# Patient Record
Sex: Male | Born: 1950
Health system: Southern US, Community
[De-identification: ages and names within clinical notes are randomized; demographics above are authoritative.]

## PROBLEM LIST (undated history)

## (undated) DIAGNOSIS — H409 Unspecified glaucoma: Secondary | ICD-10-CM

## (undated) DIAGNOSIS — E785 Hyperlipidemia, unspecified: Secondary | ICD-10-CM

## (undated) DIAGNOSIS — N400 Enlarged prostate without lower urinary tract symptoms: Secondary | ICD-10-CM

## (undated) DIAGNOSIS — G249 Dystonia, unspecified: Secondary | ICD-10-CM

## (undated) DIAGNOSIS — E079 Disorder of thyroid, unspecified: Secondary | ICD-10-CM

## (undated) DIAGNOSIS — E119 Type 2 diabetes mellitus without complications: Secondary | ICD-10-CM

## (undated) DIAGNOSIS — K219 Gastro-esophageal reflux disease without esophagitis: Secondary | ICD-10-CM

## (undated) DIAGNOSIS — I10 Essential (primary) hypertension: Secondary | ICD-10-CM

## (undated) DIAGNOSIS — N529 Male erectile dysfunction, unspecified: Secondary | ICD-10-CM

## (undated) DIAGNOSIS — R319 Hematuria, unspecified: Secondary | ICD-10-CM

## (undated) DIAGNOSIS — N189 Chronic kidney disease, unspecified: Secondary | ICD-10-CM

## (undated) DIAGNOSIS — I639 Cerebral infarction, unspecified: Secondary | ICD-10-CM

## (undated) DIAGNOSIS — M1990 Unspecified osteoarthritis, unspecified site: Secondary | ICD-10-CM

## (undated) DIAGNOSIS — K649 Unspecified hemorrhoids: Secondary | ICD-10-CM

## (undated) HISTORY — DX: Disorder of thyroid, unspecified: E07.9

## (undated) HISTORY — DX: Essential (primary) hypertension: I10

## (undated) HISTORY — DX: Hyperlipidemia, unspecified: E78.5

## (undated) HISTORY — PX: EYE SURGERY: SHX253

## (undated) HISTORY — DX: Cerebral infarction, unspecified: I63.9

## (undated) HISTORY — DX: Unspecified hemorrhoids: K64.9

## (undated) HISTORY — DX: Unspecified osteoarthritis, unspecified site: M19.90

## (undated) HISTORY — DX: Type 2 diabetes mellitus without complications: E11.9

## (undated) HISTORY — PX: CATARACT EXTRACTION: SUR2

## (undated) HISTORY — DX: Gastro-esophageal reflux disease without esophagitis: K21.9

## (undated) HISTORY — PX: EXTRACORPOREAL SHOCK WAVE LITHOTRIPSY: SHX1557

---

## 2006-03-20 ENCOUNTER — Ambulatory Visit: Payer: Self-pay | Admitting: Unknown Physician Specialty

## 2006-09-15 ENCOUNTER — Emergency Department: Payer: Self-pay | Admitting: Emergency Medicine

## 2006-09-28 ENCOUNTER — Ambulatory Visit: Payer: Self-pay | Admitting: Urology

## 2006-09-29 ENCOUNTER — Ambulatory Visit: Payer: Self-pay | Admitting: Urology

## 2006-12-01 ENCOUNTER — Ambulatory Visit: Payer: Self-pay | Admitting: Urology

## 2007-01-01 ENCOUNTER — Ambulatory Visit: Payer: Self-pay | Admitting: Urology

## 2007-01-01 ENCOUNTER — Other Ambulatory Visit: Payer: Self-pay

## 2007-01-13 ENCOUNTER — Ambulatory Visit: Payer: Self-pay | Admitting: Urology

## 2007-03-04 HISTORY — PX: KIDNEY STONE SURGERY: SHX686

## 2007-04-11 ENCOUNTER — Ambulatory Visit: Payer: Self-pay | Admitting: Unknown Physician Specialty

## 2007-08-11 ENCOUNTER — Encounter: Payer: Self-pay | Admitting: Psychiatry

## 2007-09-01 ENCOUNTER — Encounter: Payer: Self-pay | Admitting: Psychiatry

## 2007-11-30 ENCOUNTER — Ambulatory Visit: Payer: Self-pay | Admitting: Urology

## 2007-12-01 ENCOUNTER — Ambulatory Visit: Payer: Self-pay | Admitting: Urology

## 2008-07-25 ENCOUNTER — Ambulatory Visit: Payer: Self-pay | Admitting: Urology

## 2009-03-03 HISTORY — PX: PROSTATE SURGERY: SHX751

## 2009-09-15 ENCOUNTER — Emergency Department: Payer: Self-pay | Admitting: Emergency Medicine

## 2009-12-29 ENCOUNTER — Emergency Department: Payer: Self-pay | Admitting: Emergency Medicine

## 2010-02-09 ENCOUNTER — Emergency Department: Payer: Self-pay | Admitting: Emergency Medicine

## 2010-02-21 ENCOUNTER — Emergency Department: Payer: Self-pay | Admitting: Emergency Medicine

## 2010-03-13 ENCOUNTER — Ambulatory Visit: Payer: Self-pay | Admitting: Urology

## 2010-03-18 ENCOUNTER — Ambulatory Visit: Payer: Self-pay | Admitting: Urology

## 2010-07-09 ENCOUNTER — Ambulatory Visit: Payer: Self-pay | Admitting: Internal Medicine

## 2012-07-22 DIAGNOSIS — G248 Other dystonia: Secondary | ICD-10-CM | POA: Insufficient documentation

## 2012-12-07 ENCOUNTER — Emergency Department: Payer: Self-pay | Admitting: Emergency Medicine

## 2012-12-07 LAB — CBC
MCH: 31 pg (ref 26.0–34.0)
MCHC: 34.6 g/dL (ref 32.0–36.0)
MCV: 90 fL (ref 80–100)
Platelet: 194 10*3/uL (ref 150–440)
RBC: 4.9 10*6/uL (ref 4.40–5.90)

## 2012-12-07 LAB — DRUG SCREEN, URINE
Amphetamines, Ur Screen: NEGATIVE (ref ?–1000)
Benzodiazepine, Ur Scrn: NEGATIVE (ref ?–200)
Cannabinoid 50 Ng, Ur ~~LOC~~: NEGATIVE (ref ?–50)
Cocaine Metabolite,Ur ~~LOC~~: NEGATIVE (ref ?–300)
MDMA (Ecstasy)Ur Screen: NEGATIVE (ref ?–500)
Methadone, Ur Screen: NEGATIVE (ref ?–300)
Opiate, Ur Screen: NEGATIVE (ref ?–300)
Tricyclic, Ur Screen: NEGATIVE (ref ?–1000)

## 2012-12-07 LAB — COMPREHENSIVE METABOLIC PANEL
Albumin: 3.8 g/dL (ref 3.4–5.0)
Alkaline Phosphatase: 87 U/L (ref 50–136)
Bilirubin,Total: 0.5 mg/dL (ref 0.2–1.0)
Calcium, Total: 8.9 mg/dL (ref 8.5–10.1)
Chloride: 104 mmol/L (ref 98–107)
Co2: 27 mmol/L (ref 21–32)
EGFR (African American): >60
Potassium: 3.3 mmol/L — ABNORMAL LOW (ref 3.5–5.1)
SGPT (ALT): 49 U/L (ref 12–78)

## 2012-12-07 LAB — URINALYSIS, COMPLETE
Bacteria: NONE SEEN
Bilirubin,UR: NEGATIVE
Blood: NEGATIVE
Glucose,UR: NEGATIVE mg/dL (ref 0–75)
Ketone: NEGATIVE
Leukocyte Esterase: NEGATIVE
Nitrite: NEGATIVE
RBC,UR: 4 /HPF (ref 0–5)
Squamous Epithelial: NONE SEEN

## 2013-08-22 ENCOUNTER — Ambulatory Visit: Payer: Self-pay | Admitting: Family Medicine

## 2014-04-24 DIAGNOSIS — R42 Dizziness and giddiness: Secondary | ICD-10-CM | POA: Diagnosis not present

## 2014-05-04 DIAGNOSIS — R42 Dizziness and giddiness: Secondary | ICD-10-CM | POA: Diagnosis not present

## 2014-05-31 ENCOUNTER — Ambulatory Visit
Admit: 2014-05-31 | Disposition: A | Discharge status: Home / Self Care | Payer: Self-pay | Attending: Family Medicine | Admitting: Family Medicine

## 2014-05-31 DIAGNOSIS — M25572 Pain in left ankle and joints of left foot: Secondary | ICD-10-CM | POA: Diagnosis not present

## 2014-05-31 DIAGNOSIS — M25462 Effusion, left knee: Secondary | ICD-10-CM | POA: Diagnosis not present

## 2014-05-31 DIAGNOSIS — Z9181 History of falling: Secondary | ICD-10-CM | POA: Diagnosis not present

## 2014-05-31 DIAGNOSIS — M25562 Pain in left knee: Secondary | ICD-10-CM | POA: Diagnosis not present

## 2014-05-31 DIAGNOSIS — M19072 Primary osteoarthritis, left ankle and foot: Secondary | ICD-10-CM | POA: Diagnosis not present

## 2014-05-31 DIAGNOSIS — S8992XA Unspecified injury of left lower leg, initial encounter: Secondary | ICD-10-CM | POA: Diagnosis not present

## 2014-08-02 ENCOUNTER — Other Ambulatory Visit: Payer: Self-pay | Admitting: Family Medicine

## 2014-08-02 ENCOUNTER — Telehealth: Payer: Self-pay | Admitting: Family Medicine

## 2014-08-02 ENCOUNTER — Other Ambulatory Visit: Payer: Self-pay

## 2014-08-02 DIAGNOSIS — E11319 Type 2 diabetes mellitus with unspecified diabetic retinopathy without macular edema: Secondary | ICD-10-CM

## 2014-08-02 DIAGNOSIS — E1136 Type 2 diabetes mellitus with diabetic cataract: Secondary | ICD-10-CM | POA: Insufficient documentation

## 2014-08-02 MED ORDER — GLIPIZIDE ER 2.5 MG PO TB24: 2.5000 mg | ORAL_TABLET | Freq: Every day | ORAL | Status: DC

## 2014-08-02 NOTE — Telephone Encounter (Signed)
Called pharmacy and cancelled out the order for Glipizide was Rx last year in 08/2013 with 1 refill Tx np

## 2014-08-03 NOTE — Telephone Encounter (Signed)
Pt sent in refill request to [facility] for medication. It was sent via [facility] to the appropriate pharmacy. A 30 day supply of medication was sent due to patient need for diabetes follow-up.

## 2014-10-26 ENCOUNTER — Ambulatory Visit (INDEPENDENT_AMBULATORY_CARE_PROVIDER_SITE_OTHER): Payer: Commercial Managed Care - HMO | Admitting: Family Medicine

## 2014-10-26 ENCOUNTER — Encounter: Payer: Self-pay | Admitting: Family Medicine

## 2014-10-26 VITALS — BP 118/77 | HR 69 | Temp 98.4°F | Resp 16 | Ht 63.0 in | Wt 153.0 lb

## 2014-10-26 DIAGNOSIS — M25569 Pain in unspecified knee: Secondary | ICD-10-CM | POA: Insufficient documentation

## 2014-10-26 DIAGNOSIS — K219 Gastro-esophageal reflux disease without esophagitis: Secondary | ICD-10-CM | POA: Insufficient documentation

## 2014-10-26 DIAGNOSIS — I10 Essential (primary) hypertension: Secondary | ICD-10-CM | POA: Insufficient documentation

## 2014-10-26 DIAGNOSIS — N182 Chronic kidney disease, stage 2 (mild): Secondary | ICD-10-CM

## 2014-10-26 DIAGNOSIS — N529 Male erectile dysfunction, unspecified: Secondary | ICD-10-CM | POA: Insufficient documentation

## 2014-10-26 DIAGNOSIS — R42 Dizziness and giddiness: Secondary | ICD-10-CM | POA: Insufficient documentation

## 2014-10-26 DIAGNOSIS — H269 Unspecified cataract: Secondary | ICD-10-CM | POA: Insufficient documentation

## 2014-10-26 DIAGNOSIS — E1139 Type 2 diabetes mellitus with other diabetic ophthalmic complication: Secondary | ICD-10-CM | POA: Insufficient documentation

## 2014-10-26 DIAGNOSIS — D2322 Other benign neoplasm of skin of left ear and external auricular canal: Secondary | ICD-10-CM

## 2014-10-26 DIAGNOSIS — M159 Polyosteoarthritis, unspecified: Secondary | ICD-10-CM | POA: Insufficient documentation

## 2014-10-26 DIAGNOSIS — R319 Hematuria, unspecified: Secondary | ICD-10-CM | POA: Insufficient documentation

## 2014-10-26 DIAGNOSIS — I129 Hypertensive chronic kidney disease with stage 1 through stage 4 chronic kidney disease, or unspecified chronic kidney disease: Secondary | ICD-10-CM

## 2014-10-26 DIAGNOSIS — E1136 Type 2 diabetes mellitus with diabetic cataract: Secondary | ICD-10-CM | POA: Diagnosis not present

## 2014-10-26 DIAGNOSIS — M25579 Pain in unspecified ankle and joints of unspecified foot: Secondary | ICD-10-CM | POA: Insufficient documentation

## 2014-10-26 DIAGNOSIS — E785 Hyperlipidemia, unspecified: Secondary | ICD-10-CM | POA: Diagnosis not present

## 2014-10-26 DIAGNOSIS — E1169 Type 2 diabetes mellitus with other specified complication: Secondary | ICD-10-CM | POA: Insufficient documentation

## 2014-10-26 LAB — POCT GLYCOSYLATED HEMOGLOBIN (HGB A1C): HEMOGLOBIN A1C: 6.2

## 2014-10-26 MED ORDER — ASPIRIN 81 MG PO TABS: 81.0000 mg | ORAL_TABLET | Freq: Every day | ORAL | Status: DC

## 2014-10-26 MED ORDER — BENAZEPRIL-HYDROCHLOROTHIAZIDE 10-12.5 MG PO TABS: 1.0000 | ORAL_TABLET | Freq: Every day | ORAL | Status: DC

## 2014-10-26 MED ORDER — METOPROLOL TARTRATE 25 MG PO TABS: 12.5000 mg | ORAL_TABLET | Freq: Two times a day (BID) | ORAL | Status: DC

## 2014-10-26 MED ORDER — LOVASTATIN 20 MG PO TABS: 20.0000 mg | ORAL_TABLET | Freq: Every day | ORAL | Status: DC

## 2014-10-26 MED ORDER — GLIPIZIDE ER 2.5 MG PO TB24: 2.5000 mg | ORAL_TABLET | Freq: Every day | ORAL | Status: DC

## 2014-10-26 NOTE — Progress Notes (Signed)
Subjective:    Patient ID: Ross Riley, male    DOB: [birth year], 64 y.o.   MRN: [id number]  HPI: Ross Riley is a 64 y.o. male presenting on 10/26/2014 for Diabetes; Hyperlipidemia; and Hypertension   Diabetes He presents for his follow-up diabetic visit. He has type 2 diabetes mellitus. Pertinent negatives for hypoglycemia include no headaches. Pertinent negatives for diabetes include no blurred vision, no chest pain, no foot paresthesias, no foot ulcerations, no polydipsia, no polyphagia, no polyuria, no visual change and no weakness. There are no hypoglycemic complications. Symptoms are stable. Pertinent negatives for diabetic complications include no CVA, heart disease or retinopathy. Risk factors for coronary artery disease include dyslipidemia, diabetes mellitus and male sex. He participates in exercise daily. His overall blood glucose range is 110-130 mg/dl. An ACE inhibitor/angiotensin II receptor blocker is being taken. He does not see a podiatrist.Eye exam is current.  Hyperlipidemia This is a chronic problem. The problem is controlled. Exacerbating diseases include diabetes. Pertinent negatives include no chest pain or shortness of breath. Current antihyperlipidemic treatment includes statins. The current treatment provides moderate improvement of lipids. Risk factors for coronary artery disease include diabetes mellitus, dyslipidemia and hypertension.  Hypertension This is a chronic problem. Pertinent negatives include no blurred vision, chest pain, headaches or shortness of breath. There are no associated agents to hypertension. Risk factors for coronary artery disease include diabetes mellitus, dyslipidemia and male gender. Past treatments include ACE inhibitors and beta blockers. The current treatment provides significant improvement. There is no history of CVA or retinopathy.   BP- 120/80 avg at home. No visual changes or HA.   Eye Exam UTD:  05/02/2014- pt reports no  retinopathy was found.   Past Medical History  Diagnosis Date  . Hypertension   . Diabetes mellitus without complication   . Hyperlipidemia   . Arthritis   . GERD (gastroesophageal reflux disease)     No current outpatient prescriptions on file prior to visit.   No current facility-administered medications on file prior to visit.    Review of Systems  Constitutional: Negative for fever and chills.  HENT: Negative.   Eyes: Negative for blurred vision, photophobia and visual disturbance.  Respiratory: Negative for chest tightness, shortness of breath and wheezing.   Cardiovascular: Negative for chest pain.  Gastrointestinal: Negative for nausea, vomiting and abdominal pain.  Endocrine: Negative for cold intolerance, heat intolerance, polydipsia, polyphagia and polyuria.  Genitourinary: Negative.   Musculoskeletal: Negative.   Neurological: Negative for weakness, light-headedness, numbness and headaches.  Psychiatric/Behavioral: Negative.    Per HPI unless specifically indicated above     Objective:    BP 118/77 mmHg  Pulse 69  Temp(Src) 98.4 F (36.9 C) (Oral)  Resp 16  Ht 5\' 3"  (1.6 m)  Wt 153 lb (69.4 kg)  BMI 27.11 kg/m2  Wt Readings from Last 3 Encounters:  10/26/14 153 lb (69.4 kg)  05/31/14 159 lb (72.122 kg)    Physical Exam  Constitutional: He is oriented to person, place, and time. He appears well-developed and well-nourished. No distress.  Neck: Normal range of motion. Neck supple. No thyromegaly present.  Cardiovascular: Normal rate and regular rhythm.  Exam reveals no gallop and no friction rub.   No murmur heard. Pulmonary/Chest: Effort normal and breath sounds normal.  Abdominal: Soft. Bowel sounds are normal. There is no tenderness. There is no rebound.  Musculoskeletal: Normal range of motion. He exhibits no edema or tenderness.  Lymphadenopathy:    He has  no cervical adenopathy.  Neurological: He is alert and oriented to person, place, and time.   Skin: Skin is warm and dry. He is not diaphoretic.   Diabetic Foot Exam - Simple   Simple Foot Form  Diabetic Foot exam was performed with the following findings:  Yes 10/26/2014  1:50 PM  Visual Inspection  No deformities, no ulcerations, no other skin breakdown bilaterally:  Yes  Sensation Testing  Intact to touch and monofilament testing bilaterally:  Yes  Pulse Check  Posterior Tibialis and Dorsalis pulse intact bilaterally:  Yes  Comments     Results for orders placed or performed in visit on 10/26/14  POCT HgB A1C  Result Value Ref Range   Hemoglobin A1C 6.2       Assessment & Plan:   Problem List Items Addressed This Visit      Cardiovascular and Mediastinum   Benign hypertension with chronic kidney disease, stage II    Controlled. Mild CKD- last GFR 89.  On ACE and beta blocker.  DASH diet reviewed. Alarm symptoms reviewed.  RTC 6 mos.       Relevant Medications   lovastatin (MEVACOR) 20 MG tablet   benazepril-hydrochlorthiazide (LOTENSIN HCT) 10-12.5 MG per tablet   metoprolol tartrate (LOPRESSOR) 25 MG tablet   aspirin 81 MG tablet   Other Relevant Orders   Comprehensive Metabolic Panel (CMET)     Endocrine   Diabetes - Primary    Well controlled. Pt would like to continue current medications. Offered trial off medication, pt declined. Eye exam UTD- no retinopathy per patient report, March 2016. ACE for renal protection. Foot exam done today- sensation intact.  RTC 6 mos.       Relevant Medications   glipiZIDE (GLUCOTROL XL) 2.5 MG 24 hr tablet   lovastatin (MEVACOR) 20 MG tablet   benazepril-hydrochlorthiazide (LOTENSIN HCT) 10-12.5 MG per tablet   aspirin 81 MG tablet   Other Relevant Orders   POCT HgB A1C (Completed)   Comprehensive Metabolic Panel (CMET)   Urine Microalbumin w/creat. ratio     Other   Hyperlipidemia    Continue lovastatin. Check lipid panel today.       Relevant Medications   lovastatin (MEVACOR) 20 MG tablet    benazepril-hydrochlorthiazide (LOTENSIN HCT) 10-12.5 MG per tablet   metoprolol tartrate (LOPRESSOR) 25 MG tablet   aspirin 81 MG tablet   Other Relevant Orders   Lipid Profile    Other Visit Diagnoses    Benign neoplasm of ear, left        Pt requests growth on ear removed. Referred to dermatology.     Relevant Orders    Ambulatory referral to Dermatology       Meds ordered this encounter  Medications  . naproxen (NAPROSYN) 500 MG tablet    Sig: Take by mouth.  Marland Kitchen omeprazole (PRILOSEC) 20 MG capsule    Sig:   . glipiZIDE (GLUCOTROL XL) 2.5 MG 24 hr tablet    Sig: Take 1 tablet (2.5 mg total) by mouth daily with breakfast.    Dispense:  90 tablet    Refill:  3    **Patient requests 90 days supply**    Order Specific Question:  Supervising Provider    Answer:  Arlis Porta [phone]  . lovastatin (MEVACOR) 20 MG tablet    Sig: Take 1 tablet (20 mg total) by mouth at bedtime.    Dispense:  90 tablet    Refill:  3    Order  Specific Question:  Supervising Provider    Answer:  Arlis Porta [[id number]]  . benazepril-hydrochlorthiazide (LOTENSIN HCT) 10-12.5 MG per tablet    Sig: Take 1 tablet by mouth daily.    Dispense:  90 tablet    Refill:  3    Order Specific Question:  Supervising Provider    Answer:  Arlis Porta [phone]  . metoprolol tartrate (LOPRESSOR) 25 MG tablet    Sig: Take 0.5 tablets (12.5 mg total) by mouth 2 (two) times daily.    Dispense:  90 tablet    Refill:  3    Order Specific Question:  Supervising Provider    Answer:  Arlis Porta [phone]  . aspirin 81 MG tablet    Sig: Take 1 tablet (81 mg total) by mouth daily.    Dispense:  30 tablet    Order Specific Question:  Supervising Provider    Answer:  Arlis Porta [[id number]]      Follow up plan: Return in about 6 months (around 04/28/2015) for Diabetes.Marland Kitchen

## 2014-10-26 NOTE — Patient Instructions (Signed)
Great job with your diabetes!  Keep up the good work!  We will see you back in 6 mos to check your diabetes.

## 2014-10-26 NOTE — Assessment & Plan Note (Addendum)
Well controlled. Pt would like to continue current medications. Offered trial off medication, pt declined. Eye exam UTD- no retinopathy per patient report, March 2016. ACE for renal protection. Foot exam done today- sensation intact.  RTC 6 mos.

## 2014-10-26 NOTE — Assessment & Plan Note (Signed)
Controlled. Mild CKD- last GFR 89.  On ACE and beta blocker.  DASH diet reviewed. Alarm symptoms reviewed.  RTC 6 mos.

## 2014-10-26 NOTE — Assessment & Plan Note (Signed)
Continue lovastatin. Check lipid panel today.

## 2014-10-27 DIAGNOSIS — I10 Essential (primary) hypertension: Secondary | ICD-10-CM | POA: Diagnosis not present

## 2014-10-27 DIAGNOSIS — E119 Type 2 diabetes mellitus without complications: Secondary | ICD-10-CM | POA: Diagnosis not present

## 2014-10-27 DIAGNOSIS — E785 Hyperlipidemia, unspecified: Secondary | ICD-10-CM | POA: Diagnosis not present

## 2014-10-27 LAB — MICROALBUMIN / CREATININE URINE RATIO
Creatinine, Urine: 141.2 mg/dL
MICROALB/CREAT RATIO: 57.7 mg/g creat — ABNORMAL HIGH (ref 0.0–30.0)
MICROALBUM., U, RANDOM: 81.5 ug/mL

## 2014-10-28 LAB — COMPREHENSIVE METABOLIC PANEL
ALK PHOS: 62 IU/L (ref 39–117)
ALT: 24 IU/L (ref 0–44)
AST: 18 IU/L (ref 0–40)
Albumin/Globulin Ratio: 1.1 (ref 1.1–2.5)
Albumin: 3.9 g/dL (ref 3.6–4.8)
BUN / CREAT RATIO: 15 (ref 10–22)
BUN: 11 mg/dL (ref 8–27)
Bilirubin Total: 0.5 mg/dL (ref 0.0–1.2)
CHLORIDE: 101 mmol/L (ref 97–108)
CO2: 23 mmol/L (ref 18–29)
Calcium: 9.2 mg/dL (ref 8.6–10.2)
Creatinine, Ser: 0.75 mg/dL — ABNORMAL LOW (ref 0.76–1.27)
GFR calc Af Amer: 113 mL/min/{1.73_m2} (ref >59)
GFR calc non Af Amer: 98 mL/min/{1.73_m2} (ref >59)
GLOBULIN, TOTAL: 3.4 g/dL (ref 1.5–4.5)
GLUCOSE: 99 mg/dL (ref 65–99)
Potassium: 4.1 mmol/L (ref 3.5–5.2)
SODIUM: 141 mmol/L (ref 134–144)
Total Protein: 7.3 g/dL (ref 6.0–8.5)

## 2014-10-28 LAB — LIPID PANEL
CHOLESTEROL TOTAL: 137 mg/dL (ref 100–199)
Chol/HDL Ratio: 3.2 ratio units (ref 0.0–5.0)
HDL: 43 mg/dL (ref >39)
LDL Calculated: 80 mg/dL (ref 0–99)
Triglycerides: 69 mg/dL (ref 0–149)
VLDL Cholesterol Cal: 14 mg/dL (ref 5–40)

## 2014-10-31 ENCOUNTER — Encounter: Payer: Self-pay | Admitting: Emergency Medicine

## 2014-10-31 ENCOUNTER — Ambulatory Visit
Admission: EM | Admit: 2014-10-31 | Discharge: 2014-10-31 | Disposition: A | Discharge status: Home / Self Care | Payer: Commercial Managed Care - HMO | Attending: Family Medicine | Admitting: Family Medicine

## 2014-10-31 ENCOUNTER — Telehealth: Payer: Self-pay | Admitting: Family Medicine

## 2014-10-31 DIAGNOSIS — N390 Urinary tract infection, site not specified: Secondary | ICD-10-CM | POA: Diagnosis not present

## 2014-10-31 DIAGNOSIS — Z7251 High risk heterosexual behavior: Secondary | ICD-10-CM

## 2014-10-31 HISTORY — DX: Dystonia, unspecified: G24.9

## 2014-10-31 HISTORY — DX: Benign prostatic hyperplasia without lower urinary tract symptoms: N40.0

## 2014-10-31 HISTORY — DX: Hematuria, unspecified: R31.9

## 2014-10-31 HISTORY — DX: Male erectile dysfunction, unspecified: N52.9

## 2014-10-31 LAB — URINALYSIS COMPLETE WITH MICROSCOPIC (ARMC ONLY)
BILIRUBIN URINE: NEGATIVE
GLUCOSE, UA: NEGATIVE mg/dL
Ketones, ur: NEGATIVE mg/dL
Nitrite: NEGATIVE
Protein, ur: 100 mg/dL — AB
SQUAMOUS EPITHELIAL / LPF: NONE SEEN — AB
Specific Gravity, Urine: 1.025 (ref 1.005–1.030)
pH: 6.5 (ref 5.0–8.0)

## 2014-10-31 MED ORDER — CEFTRIAXONE SODIUM 250 MG IJ SOLR
250.0000 mg | Freq: Once | INTRAMUSCULAR | Status: AC
  Administered 2014-10-31: 250 mg via INTRAMUSCULAR

## 2014-10-31 MED ORDER — AZITHROMYCIN 500 MG PO TABS
1000.0000 mg | ORAL_TABLET | Freq: Once | ORAL | Status: AC
  Administered 2014-10-31: 1000 mg via ORAL

## 2014-10-31 MED ORDER — CIPROFLOXACIN HCL 250 MG PO TABS: 250.0000 mg | ORAL_TABLET | Freq: Two times a day (BID) | ORAL | Status: DC

## 2014-10-31 NOTE — ED Notes (Signed)
Patient c/o pain when urinating for the past 3 days.  Patient denies fevers.

## 2014-10-31 NOTE — Telephone Encounter (Signed)
Agree, as we discussed.-jh

## 2014-10-31 NOTE — ED Provider Notes (Signed)
CSN: [id number]     Arrival date & time 10/31/14  0941 History   First MD Initiated Contact with Patient 10/31/14 1117     Chief Complaint  Patient presents with  . Dysuria   (Consider location/radiation/quality/duration/timing/severity/associated sxs/prior Treatment) HPI Comments: 64 yo male with a 3 days h/o burning with urination. Denies any fevers, chills, vomiting, back pain or penile discharge. States has a h/o enlarged prostate. Also states recently had unprotected sexual intercourse with an acquaintance that he sees occasionally. Denies h/o STDs.   Patient is a 64 y.o. male presenting with dysuria. The history is provided by the patient.  Dysuria This is a new problem.    Past Medical History  Diagnosis Date  . Hypertension   . Diabetes mellitus without complication   . Hyperlipidemia   . Arthritis   . GERD (gastroesophageal reflux disease)    Past Surgical History  Procedure Laterality Date  . Prostate surgery    . Kidney stone surgery     History reviewed. No pertinent family history. Social History  Substance Use Topics  . Smoking status: Former Smoker -- 1.00 packs/day for 10 years    Types: Cigarettes    Quit date: 03/04/1991  . Smokeless tobacco: Never Used  . Alcohol Use: Yes    Review of Systems  Genitourinary: Positive for dysuria.    Allergies  Metformin and related and Metformin hcl  Home Medications   Prior to Admission medications   Medication Sig Start Date End Date Taking? Authorizing Provider  aspirin 81 MG tablet Take 1 tablet (81 mg total) by mouth daily. 10/26/14   Amy Overton Mam, NP  benazepril-hydrochlorthiazide (LOTENSIN HCT) 10-12.5 MG per tablet Take 1 tablet by mouth daily. 10/26/14   Amy Overton Mam, NP  ciprofloxacin (CIPRO) 250 MG tablet Take 1 tablet (250 mg total) by mouth every 12 (twelve) hours. 10/31/14   Norval Gable, MD  glipiZIDE (GLUCOTROL XL) 2.5 MG 24 hr tablet Take 1 tablet (2.5 mg total) by mouth daily with  breakfast. 10/26/14   Amy Overton Mam, NP  lovastatin (MEVACOR) 20 MG tablet Take 1 tablet (20 mg total) by mouth at bedtime. 10/26/14   Amy Overton Mam, NP  metoprolol tartrate (LOPRESSOR) 25 MG tablet Take 0.5 tablets (12.5 mg total) by mouth 2 (two) times daily. 10/26/14   Amy Overton Mam, NP  naproxen (NAPROSYN) 500 MG tablet Take by mouth. 05/31/14   Historical Provider, MD  omeprazole (PRILOSEC) 20 MG capsule  06/21/12   Historical Provider, MD   Meds Ordered and Administered this Visit   Medications  cefTRIAXone (ROCEPHIN) injection 250 mg (250 mg Intramuscular Given 10/31/14 1210)  azithromycin (ZITHROMAX) tablet 1,000 mg (1,000 mg Oral Given 10/31/14 1210)    BP 144/86 mmHg  Pulse 63  Temp(Src) 97.9 F (36.6 C) (Tympanic)  Resp 16  Ht 5\' 6"  (1.676 m)  Wt 160 lb (72.576 kg)  BMI 25.84 kg/m2  SpO2 100% No data found.   Physical Exam  Constitutional: He appears well-developed and well-nourished. No distress.  Abdominal: Soft. Bowel sounds are normal. He exhibits no distension and no mass. There is no tenderness. There is no rebound and no guarding.  Genitourinary: Testes normal and penis normal. No penile tenderness.  Skin: He is not diaphoretic.  Nursing note and vitals reviewed.   ED Course  Procedures (including critical care time)  Labs Review Labs Reviewed  URINALYSIS COMPLETEWITH MICROSCOPIC [facility] ONLY) - Abnormal; Notable for the following:    APPearance HAZY (*)  Hgb urine dipstick 3+ (*)    Protein, ur 100 (*)    Leukocytes, UA 1+ (*)    Bacteria, UA MANY (*)    Squamous Epithelial / LPF NONE SEEN (*)    All other components within normal limits  URINE CULTURE  CHLAMYDIA/NGC RT PCR ([facility] ONLY)    Imaging Review No results found.   Visual Acuity Review  Right Eye Distance:   Left Eye Distance:   Bilateral Distance:    Right Eye Near:   Left Eye Near:    Bilateral Near:         MDM   1. UTI (lower urinary tract infection)    2. History of unprotected sex    New Prescriptions   CIPROFLOXACIN (CIPRO) 250 MG TABLET    Take 1 tablet (250 mg total) by mouth every 12 (twelve) hours.  Plan: 1. Test results and diagnosis reviewed with patient 2. rx as per orders; risks, benefits, potential side effects reviewed with patient 3. Check urine culture, GC, chlamydia 4. Patient given rocephin 250mg  IM x1 and zithromax 1gm po x 1 for empiric treatment  5. Recommend supportive treatment with increase water intake; otc analgesics prn 6. F/u prn if symptoms worsen or don't improve    Norval Gable, MD 10/31/14 1241

## 2014-10-31 NOTE — Telephone Encounter (Signed)
Patients daughter called to report blood in urine and said it is bright red blood. Declines abdominal pain but does state that he is having trouble urinating and it does burn. I called back after speaking to Dr. Luan Pulling and advised ER. I told him he could have issue with Prostate or have a stone and ER can cath and do labs and U/S. Patient then said he has not had bleeding in 2weeks but has had pain with urinating. He wants antibiotic called in. I went over reasons that we can not do that and advised Urgent Care and that they may send to ER anyways but to start there. He wanted [place] to do UA but I advised over and over ER and Urgent Care. [facility]

## 2014-11-01 LAB — CHLAMYDIA/NGC RT PCR (ARMC ONLY)
CHLAMYDIA TR: DETECTED — AB
N gonorrhoeae: NOT DETECTED

## 2014-11-07 ENCOUNTER — Telehealth: Payer: Self-pay | Admitting: Family Medicine

## 2014-11-07 NOTE — Telephone Encounter (Signed)
Called [facility] and [place] and they are both booking new patients Nov, and Dec 2016. Pt insist on being seen in September before [place] leaves [place]. Explained there are no appts available. Pt request appts be cancelled.

## 2014-11-07 NOTE — Telephone Encounter (Signed)
Pt asked about the status of referral to [facility] for skin tags.  Please call [phone]

## 2014-11-08 ENCOUNTER — Telehealth: Payer: Self-pay | Admitting: Family Medicine

## 2014-11-08 ENCOUNTER — Ambulatory Visit (INDEPENDENT_AMBULATORY_CARE_PROVIDER_SITE_OTHER): Payer: Commercial Managed Care - HMO | Admitting: Urology

## 2014-11-08 ENCOUNTER — Ambulatory Visit: Payer: Self-pay

## 2014-11-08 VITALS — BP 125/85 | HR 65 | Ht 66.0 in | Wt 156.4 lb

## 2014-11-08 DIAGNOSIS — N400 Enlarged prostate without lower urinary tract symptoms: Secondary | ICD-10-CM | POA: Diagnosis not present

## 2014-11-08 DIAGNOSIS — R350 Frequency of micturition: Secondary | ICD-10-CM | POA: Diagnosis not present

## 2014-11-08 LAB — PR COMPLEX UROFLOWMETRY

## 2014-11-08 LAB — BLADDER SCAN AMB NON-IMAGING: SCAN RESULT: 167

## 2014-11-08 NOTE — Telephone Encounter (Signed)
Olivia Mackie from  [facility] called states that they need a referral for today pt was in office this morning Olivia Mackie call back # is  [phone]

## 2014-11-08 NOTE — Progress Notes (Signed)
°  Uroflow  Peak Flow: 13.9ml Average Flow: 9.52ml Voided Volume: 254.53ml Voiding Time: 32.2 sec Flow Time: 26.6 sec Time to Peak Flow: 5.7 sec  PVR Volume 187 ml  Performed By:

## 2014-11-08 NOTE — Telephone Encounter (Signed)
[employer] approval is done when [facility] called and the approval number is [id number]. Thanks [employer]

## 2014-11-08 NOTE — Patient Instructions (Signed)
Transurethral Resection of the Prostate Transurethral resection of the prostate (TURP) is the removal of part of your prostate to treat noncancerous (benign) prostatic hyperplasia (BPH). BPH typically occurs in men older than 40 years. It is the abnormal growth of cells in your prostate. Specifically, it is an abnormal increase in the number of cells that make up your prostate tissue. This causes an increase in the size of your prostate. Often, in the case of BPH, the prostate becomes so large that it compresses the tube that drains urine out of your body from your bladder (urethra). Eventually, this compression can obstruct the flow of urine from your bladder. This obstruction can cause recurrent bladder infection and difficulties with bladder control and bladder emptying. The goal of TURP is to remove enough prostate tissue to allow for an unobstructed flow of urine, which often resolves the associated conditions. LET YOUR CAREGIVER KNOW ABOUT:  Any allergies you have.  Any medicines you are taking, including herbs, eye drops, over-the-counter medicines, and creams.  Any problems you have had with the use of anesthetics.  Any blood disorders you have, including bleeding problems and clotting problems.  Previous surgeries you have had.  Any prostate infections you have had. RISKS AND COMPLICATIONS Generally, TURP is a safe procedure. However, as with any surgical procedure, complications can occur. Possible complications associated with TURP include:  Difficulty getting an erection.  Scarring, which may cause problems with the flow of your urine.  Injury to your urethra.  Incontinence from injury to the muscle that surrounds your prostate, which controls urine flow.  Infection.  Bleeding.  Injury to your bladder (rare). BEFORE THE PROCEDURE  Your caregiver will tell you when you need to stop eating and drinking. If you take any medicines, your caregiver will tell you which ones you  may keep taking and which ones you will have to stop taking and when.  Just before the procedure you will also receive medicine to make you fall asleep (general anesthetic). This will be given through a tube that is inserted into one of your veins (intravenous [IV] tube). PROCEDURE Your surgeon inserts an instrument that is similar to a telescope with an electric cutting edge (resectoscope) through your urethra to the area of the prostate gland. The cutting edge is used to remove enlarged pieces of your prostate, one piece at a time. At the end of your procedure, a flexible tube (catheter) will be inserted into your urethra to drain your bladder. Special plastic bags filled with solution will be connected to the end of the catheter. The solution will be used to irrigate blood from your bladder while you heal.  AFTER THE PROCEDURE You will be taken to the recovery area. Once you are awake, stable, and taking fluids well, you will be taken to your hospital room. Typically, you will stay in the hospital 1-2 days after this procedure. The catheter usually is removed before discharge from the hospital. Document Released: 02/17/2005 Document Revised: 11/12/2011 Document Reviewed: 07/21/2011 [facility] Patient Information 2015 [place]. This information is not intended to replace advice given to you by your health care provider. Make sure you discuss any questions you have with your health care provider.   Reseccin transuretral de la prstata  (Transurethral Resection of the Prostate)  La reseccin transuretral de la prstata (RTUP) es la extirpacin de una parte de la prstata para tratar la hiperplasia prosttica no cancerosa (benigna) o HPB. La HPB se produce normalmente en hombres mayores de 40  aos. Es el crecimiento anormal de clulas en la prstata. Especficamente, es el aumento anormal en el nmero de clulas que componen el tejido de la prstata. Esto provoca un aumento en su tamao.  Generalmente, en el caso de la HPB, la prstata se agranda tanto que comprime el conducto que drena la orina fuera del cuerpo desde la vejiga (uretra). Con el tiempo, esta compresin puede obstruir el flujo de orina desde la vejiga. Esta obstruccin puede causar infecciones recurrentes de la vejiga y dificultades con el control y el vaciado de la vejiga. El objetivo de la RTUP es eliminar una cantidad suficiente de tejido prosttico para permitir un flujo sin obstrucciones de la [place], lo cual a menudo resuelve las afecciones asociadas. INFORME A SU MDICO SOBRE:   Alergias que sufra.  Medicamentos que [place], [place], hierbas, gotas oftlmicas, medicamentos de [place] y Proofreader.  Problemas anteriores con el uso de anestsicos.  Toda enfermedad de la sangre que padezca, incluso problemas de hemorragias y de Proofreader.  Cirugas previas.  Infecciones previas de la prstata. RIESGOS Y COMPLICACIONES  En general, la reseccin transuretral de prstata es un procedimiento seguro. Sin embargo, como en todo procedimiento quirrgico, pueden ocurrir complicaciones. Las posibles complicaciones asociadas con la RTUP son:   Dificultad para Sports administrator.  Cicatrices, lo que puede causar problemas con el flujo de la [place].  Lesin en la uretra.  Incontinencia por lesin en el msculo que rodea la prstata, el que controla el flujo de [place].  Infecciones.  Sangrado.  Lesin en la vejiga (raro). ANTES DEL PROCEDIMIENTO  Su mdico le indicar cundo debe dejar de comer y beber. Si toma algn medicamento, el mdico le dir cules puede seguir tomando y cules tendr que dejar de tomar y cundo.  Antes del procedimiento, tambin recibir medicamentos para dormir (anestesia general). Se lo administrarn a travs de un catter insertado en una vena (va intravenosa [IV]).  PROCEDIMIENTO  El cirujano inserta un instrumento similar a un telescopio con un borde de corte elctrico  (resectoscopio) a travs de la uretra hasta el rea de la glndula prosttica. La punta se utiliza para eliminar zonas agrandadas de la prstata, de a pequeos pedacitos por vez. Al final del procedimiento, se inserta un tubo flexible (catter) en la uretra para drenar la vejiga. Al extremo del catter se conectan bolsas de plstico especiales llenas con una solucin. La solucin se Risk manager para Lawyer de la vejiga [place] se recupera.  DESPUS DEL PROCEDIMIENTO  Lo conducirn a la zona de recuperacin. Una vez que despierte, se encuentre estable y pueda ingerir lquidos, excepto que ocurra un imprevisto, podr volver a su habitacin. En general, la permanencia en el hospital es de 1 a 2 das despus del procedimiento. El catter normalmente se retira antes de recibir el alta del hospital.  Document Released: 12/15/2008 Document Revised: 11/12/2011 [employer] Patient Information 2015 [place], [place]. This information is not intended to replace advice given to you by your health care provider. Make sure you discuss any questions you have with your health care provider.

## 2014-11-08 NOTE — Progress Notes (Signed)
11/08/2014 11:23 AM   Ross Riley [birth year] [id number]  Referring provider: Luciana Axe, NP [place], [place] [address]  CC: BPH  HPI: Patient is a 64 year old male with a past history ofhistory of BPH status post greenlight laser by Dr. Eliberto Ivory 3-4 years ago and episodic gross hematuria. He underwent office cystoscopy one year ago which showed regrowth of the median lobe and was determined the likely source of his gross hematuria as well. He also at that time had a punctate left mid pole stone and a renal ultrasound. Since his was last seen he did have a dysuria and was treated in the ED for a positive chlamydia culture on 11/01/2014.  He reports that this has resolved.  On follow-up today he was noted to have an I-PSS score of 28 with overall quality of life of 6.  He is very bothered by his symptoms particularly as frequency, intermittency, urgency, and weak stream and nocturia 3 times per night. He still notes intermittent gross hematuria.  PMH: Past Medical History  Diagnosis Date  . Hypertension   . Diabetes mellitus without complication   . Hyperlipidemia   . Arthritis   . GERD (gastroesophageal reflux disease)   . Dystonia   . ED (erectile dysfunction)   . BPH (benign prostatic hyperplasia)   . Hematuria     Surgical History: Past Surgical History  Procedure Laterality Date  . Prostate surgery  2011  . Kidney stone surgery  2009    Home Medications:    Medication List       This list is accurate as of: 11/08/14 11:23 AM.  Always use your most recent med list.               aspirin 81 MG tablet  Take 1 tablet (81 mg total) by mouth daily.     benazepril-hydrochlorthiazide 10-12.5 MG per tablet  Commonly known as:  LOTENSIN HCT  Take 1 tablet by mouth daily.     ciprofloxacin 250 MG tablet  Commonly known as:  CIPRO  Take 1 tablet (250 mg total) by mouth every 12 (twelve) hours.     glipiZIDE 2.5 MG 24 hr tablet  Commonly known as:   GLUCOTROL XL  Take 1 tablet (2.5 mg total) by mouth daily with breakfast.     lovastatin 20 MG tablet  Commonly known as:  MEVACOR  Take 1 tablet (20 mg total) by mouth at bedtime.     metoprolol tartrate 25 MG tablet  Commonly known as:  LOPRESSOR  Take 0.5 tablets (12.5 mg total) by mouth 2 (two) times daily.     naproxen 500 MG tablet  Commonly known as:  NAPROSYN  Take by mouth.     omeprazole 20 MG capsule  Commonly known as:  PRILOSEC        Allergies:  Allergies  Allergen Reactions  . Metformin And Related Other (See Comments)    Headache and dizzines  . Metformin Hcl     headache and dizziness    Family History: No family history on file.  Social History:  reports that he quit smoking about 23 years ago. His smoking use included Cigarettes. He has a 10 pack-year smoking history. He has never used smokeless tobacco. He reports that he drinks alcohol. He reports that he does not use illicit drugs.  ROS: UROLOGY Frequent Urination?: Yes Hard to postpone urination?: Yes Burning/pain with urination?: Yes Get up at night to urinate?: No Leakage  of urine?: Yes Urine stream starts and stops?: No Trouble starting stream?: No Do you have to strain to urinate?: No Blood in urine?: No Urinary tract infection?: No Sexually transmitted disease?: No Injury to kidneys or bladder?: No Painful intercourse?: No Weak stream?: Yes Erection problems?: No Penile pain?: No  Gastrointestinal Nausea?: No Vomiting?: No Indigestion/heartburn?: No Diarrhea?: No Constipation?: No  Constitutional Fever: No Night sweats?: No Weight loss?: No Fatigue?: No  Skin Skin rash/lesions?: No Itching?: No  Eyes Blurred vision?: No Double vision?: No  Ears/Nose/Throat Sore throat?: No Sinus problems?: No  Hematologic/Lymphatic Swollen glands?: No Easy bruising?: No  Cardiovascular Leg swelling?: No Chest pain?: No  Respiratory Cough?: No Shortness of breath?:  No  Endocrine Excessive thirst?: No  Musculoskeletal Back pain?: No Joint pain?: No  Neurological Headaches?: No Dizziness?: No  Psychologic Depression?: No Anxiety?: No  Physical Exam: BP 125/85 mmHg  Pulse 65  Ht 5\' 6"  (1.676 m)  Wt 156 lb 6.4 oz (70.943 kg)  BMI 25.26 kg/m2  Constitutional:  Alert and oriented, No acute distress. HEENT: [place] AT, moist mucus membranes.  Trachea midline, no masses. Cardiovascular: No clubbing, cyanosis, or edema. Respiratory: Normal respiratory effort, no increased work of breathing. GI: Abdomen is soft, nontender, nondistended, no abdominal masses GU: No CVA tenderness. Normal phallus.  Testicles descended bilaterally. DRE: 2+ prostate, smooth. No nodules. Skin: No rashes, bruises or suspicious lesions. Lymph: No cervical or inguinal adenopathy. Neurologic: Grossly intact, no focal deficits, moving all 4 extremities. Psychiatric: Normal mood and affect.  Laboratory Data: Lab Results  Component Value Date   WBC 6.3 12/07/2012   HGB 15.2 12/07/2012   HCT 43.8 12/07/2012   MCV 90 12/07/2012   PLT 194 12/07/2012    Lab Results  Component Value Date   CREATININE 0.75* 10/27/2014    No results found for: PSA  No results found for: TESTOSTERONE  Lab Results  Component Value Date   HGBA1C 6.2 10/26/2014    Urinalysis    Component Value Date/Time   COLORURINE YELLOW 10/31/2014 1035   COLORURINE Yellow 12/07/2012 1602   APPEARANCEUR HAZY* 10/31/2014 1035   APPEARANCEUR Clear 12/07/2012 1602   LABSPEC 1.025 10/31/2014 1035   LABSPEC 1.015 12/07/2012 1602   PHURINE 6.5 10/31/2014 1035   PHURINE 6.0 12/07/2012 1602   GLUCOSEU NEGATIVE 10/31/2014 1035   GLUCOSEU Negative 12/07/2012 1602   HGBUR 3+* 10/31/2014 1035   HGBUR Negative 12/07/2012 1602   BILIRUBINUR NEGATIVE 10/31/2014 1035   BILIRUBINUR Negative 12/07/2012 1602   KETONESUR NEGATIVE 10/31/2014 1035   KETONESUR Negative 12/07/2012 1602   PROTEINUR 100*  10/31/2014 1035   PROTEINUR Negative 12/07/2012 1602   NITRITE NEGATIVE 10/31/2014 1035   NITRITE Negative 12/07/2012 1602   LEUKOCYTESUR 1+* 10/31/2014 1035   LEUKOCYTESUR Negative 12/07/2012 1602   Uroflow Qmax: 13 ml/s  Assessment & Plan:    1. BPH (benign prostatic hyperplasia) Previous failure of greenlight laser. Also has regrowth of median lobe. We discussed in great detail options for him on in terms of better management of his severe symptoms. We discussed in detail how a transurethral resection of prostate may be the best treatment for him given his large median lobe and failure to respond to green light laser therapy for an extended period of time. He was given a handout in both [place] and Spanish on the transurethral resection of prostate procedure and all questions were answered. He was informed, among other things, the risks of retrograde ejaculation and bleeding. He  was also informed that he will remain in the hospital overnight with a catheter. We will need to in the future ensure that his hematuria improves following this procedure, or he may need a more formal hematuria workup.  2. PSA Screening -will re-check annual PSA today  Follow up for TURP.   Nickie Retort, MD  [facility] [address] [place], [place] [address] [phone]

## 2014-11-09 LAB — PSA: Prostate Specific Ag, Serum: 6 ng/mL — ABNORMAL HIGH (ref 0.0–4.0)

## 2014-11-10 ENCOUNTER — Telehealth: Payer: Self-pay | Admitting: *Deleted

## 2014-11-10 LAB — URINE CULTURE
CULTURE: NO GROWTH
Special Requests: NORMAL

## 2014-11-10 NOTE — Telephone Encounter (Signed)
Appt for [facility] 11/20/14 @ 3:30.

## 2014-11-16 ENCOUNTER — Ambulatory Visit: Payer: Self-pay

## 2014-11-17 ENCOUNTER — Telehealth: Payer: Self-pay | Admitting: Radiology

## 2014-11-17 NOTE — Telephone Encounter (Signed)
Notified pt of surgery scheduled 12/06/14 with the help of the Language Line. Notified of Pre-Admit Testing appt on 11/27/14. Asked pt which Dr prescribes his Aspirin but pt states he doesn't know which clinic he goes to and the Dr that prescribed it no longer works there.  Pt states he doesn't take ASA every day. Advised pt to hold ASA 7 days prior to surgery.

## 2014-11-20 DIAGNOSIS — L918 Other hypertrophic disorders of the skin: Secondary | ICD-10-CM | POA: Diagnosis not present

## 2014-11-20 DIAGNOSIS — L821 Other seborrheic keratosis: Secondary | ICD-10-CM | POA: Diagnosis not present

## 2014-11-27 ENCOUNTER — Encounter
Admission: RE | Admit: 2014-11-27 | Discharge: 2014-11-27 | Disposition: A | Discharge status: Home / Self Care | Payer: Commercial Managed Care - HMO | Source: Ambulatory Visit | Attending: Urology | Admitting: Urology

## 2014-11-27 DIAGNOSIS — N400 Enlarged prostate without lower urinary tract symptoms: Secondary | ICD-10-CM | POA: Diagnosis not present

## 2014-11-27 DIAGNOSIS — Z01818 Encounter for other preprocedural examination: Secondary | ICD-10-CM | POA: Diagnosis not present

## 2014-11-27 DIAGNOSIS — I10 Essential (primary) hypertension: Secondary | ICD-10-CM | POA: Diagnosis not present

## 2014-11-27 HISTORY — DX: Unspecified glaucoma: H40.9

## 2014-11-27 HISTORY — DX: Chronic kidney disease, unspecified: N18.9

## 2014-11-27 LAB — BASIC METABOLIC PANEL
ANION GAP: 7 (ref 5–15)
BUN: 13 mg/dL (ref 6–20)
CALCIUM: 9.2 mg/dL (ref 8.9–10.3)
CO2: 29 mmol/L (ref 22–32)
CREATININE: 0.88 mg/dL (ref 0.61–1.24)
Chloride: 104 mmol/L (ref 101–111)
Glucose, Bld: 99 mg/dL (ref 65–99)
Potassium: 4.1 mmol/L (ref 3.5–5.1)
SODIUM: 140 mmol/L (ref 135–145)

## 2014-11-27 LAB — DIFFERENTIAL
BASOS ABS: 0 10*3/uL (ref 0–0.1)
BASOS PCT: 1 %
EOS ABS: 0.2 10*3/uL (ref 0–0.7)
EOS PCT: 3 %
LYMPHS ABS: 3.3 10*3/uL (ref 1.0–3.6)
Lymphocytes Relative: 62 %
Monocytes Absolute: 0.5 10*3/uL (ref 0.2–1.0)
Monocytes Relative: 10 %
NEUTROS PCT: 24 %
Neutro Abs: 1.3 10*3/uL — ABNORMAL LOW (ref 1.4–6.5)

## 2014-11-27 LAB — CBC
HCT: 47.6 % (ref 40.0–52.0)
HEMOGLOBIN: 16 g/dL (ref 13.0–18.0)
MCH: 30.1 pg (ref 26.0–34.0)
MCHC: 33.7 g/dL (ref 32.0–36.0)
MCV: 89.2 fL (ref 80.0–100.0)
PLATELETS: 161 10*3/uL (ref 150–440)
RBC: 5.33 MIL/uL (ref 4.40–5.90)
RDW: 13.2 % (ref 11.5–14.5)
WBC: 5.3 10*3/uL (ref 3.8–10.6)

## 2014-11-27 NOTE — Patient Instructions (Signed)
  Your procedure is scheduled on: December 06, 2014 (Wednesday) Report to Day Surgery.[facility]) To find out your arrival time please call [phone] between 1PM - 3PM on December 04, 2104 (Tuesday).  Remember: Instructions that are not followed completely may result in serious medical risk, up to and including death, or upon the discretion of your surgeon and anesthesiologist your surgery may need to be rescheduled.    __x__ 1. Do not eat food or drink liquids after midnight. No gum chewing or hard candies.     __x__ 2. No Alcohol for 24 hours before or after surgery.   ____ 3. Bring all medications with you on the day of surgery if instructed.    __x__ 4. Notify your doctor if there is any change in your medical condition     (cold, fever, infections).     Do not wear jewelry, make-up, hairpins, clips or nail polish.  Do not wear lotions, powders, or perfumes. You may wear deodorant.  Do not shave 48 hours prior to surgery. Men may shave face and neck.  Do not bring valuables to the hospital.    [facility] is not responsible for any belongings or valuables.               Contacts, dentures or bridgework may not be worn into surgery.  Leave your suitcase in the car. After surgery it may be brought to your room.  For patients admitted to the hospital, discharge time is determined by your                treatment team.   Patients discharged the day of surgery will not be allowed to drive home.   Please read over the following fact sheets that you were given:      _x___ Take these medicines the morning of surgery with A SIP OF WATER:    1. Metoprolol  2. Omeprazole  3.   4.  5.  6.  ____ Fleet Enema (as directed)   ____ Use CHG Soap as directed  ____ Use inhalers on the day of surgery  ____ Stop metformin 2 days prior to surgery    ____ Take 1/2 of usual insulin dose the night before surgery and none on the morning of surgery.   _x___ Stop Coumadin/Plavix/aspirin  on (STOP ASPIRIN ONE WEEK BEFORE SURGERY)  __x__ Stop Anti-inflammatories on (STOP NAPROXEN ONE WEEK BEFORE SURGERY)   ____ Stop supplements until after surgery.    ____ Bring C-Pap to the hospital.

## 2014-12-06 ENCOUNTER — Encounter
Admission: RE | Disposition: A | Discharge status: Home / Self Care | Payer: Self-pay | Source: Ambulatory Visit | Attending: Urology

## 2014-12-06 ENCOUNTER — Encounter: Payer: Self-pay | Admitting: *Deleted

## 2014-12-06 ENCOUNTER — Ambulatory Visit: Payer: Commercial Managed Care - HMO | Admitting: Registered Nurse

## 2014-12-06 ENCOUNTER — Observation Stay
Admission: RE | Admit: 2014-12-06 | Discharge: 2014-12-07 | Disposition: A | Discharge status: Home / Self Care | Payer: Commercial Managed Care - HMO | Source: Ambulatory Visit | Attending: Urology | Admitting: Urology

## 2014-12-06 DIAGNOSIS — Z79899 Other long term (current) drug therapy: Secondary | ICD-10-CM | POA: Diagnosis not present

## 2014-12-06 DIAGNOSIS — Z87891 Personal history of nicotine dependence: Secondary | ICD-10-CM | POA: Insufficient documentation

## 2014-12-06 DIAGNOSIS — R3915 Urgency of urination: Secondary | ICD-10-CM | POA: Diagnosis not present

## 2014-12-06 DIAGNOSIS — H269 Unspecified cataract: Secondary | ICD-10-CM | POA: Insufficient documentation

## 2014-12-06 DIAGNOSIS — R3912 Poor urinary stream: Secondary | ICD-10-CM | POA: Insufficient documentation

## 2014-12-06 DIAGNOSIS — I129 Hypertensive chronic kidney disease with stage 1 through stage 4 chronic kidney disease, or unspecified chronic kidney disease: Secondary | ICD-10-CM | POA: Insufficient documentation

## 2014-12-06 DIAGNOSIS — N529 Male erectile dysfunction, unspecified: Secondary | ICD-10-CM | POA: Insufficient documentation

## 2014-12-06 DIAGNOSIS — Z23 Encounter for immunization: Secondary | ICD-10-CM | POA: Insufficient documentation

## 2014-12-06 DIAGNOSIS — E1122 Type 2 diabetes mellitus with diabetic chronic kidney disease: Secondary | ICD-10-CM | POA: Insufficient documentation

## 2014-12-06 DIAGNOSIS — M1990 Unspecified osteoarthritis, unspecified site: Secondary | ICD-10-CM | POA: Insufficient documentation

## 2014-12-06 DIAGNOSIS — G249 Dystonia, unspecified: Secondary | ICD-10-CM | POA: Diagnosis not present

## 2014-12-06 DIAGNOSIS — Z9889 Other specified postprocedural states: Secondary | ICD-10-CM | POA: Insufficient documentation

## 2014-12-06 DIAGNOSIS — N400 Enlarged prostate without lower urinary tract symptoms: Secondary | ICD-10-CM | POA: Diagnosis present

## 2014-12-06 DIAGNOSIS — R351 Nocturia: Secondary | ICD-10-CM | POA: Insufficient documentation

## 2014-12-06 DIAGNOSIS — N401 Enlarged prostate with lower urinary tract symptoms: Secondary | ICD-10-CM | POA: Diagnosis not present

## 2014-12-06 DIAGNOSIS — K219 Gastro-esophageal reflux disease without esophagitis: Secondary | ICD-10-CM | POA: Diagnosis not present

## 2014-12-06 DIAGNOSIS — E119 Type 2 diabetes mellitus without complications: Secondary | ICD-10-CM | POA: Diagnosis not present

## 2014-12-06 DIAGNOSIS — E785 Hyperlipidemia, unspecified: Secondary | ICD-10-CM | POA: Diagnosis not present

## 2014-12-06 DIAGNOSIS — Z7982 Long term (current) use of aspirin: Secondary | ICD-10-CM | POA: Insufficient documentation

## 2014-12-06 DIAGNOSIS — N183 Chronic kidney disease, stage 3 (moderate): Secondary | ICD-10-CM | POA: Diagnosis not present

## 2014-12-06 DIAGNOSIS — I10 Essential (primary) hypertension: Secondary | ICD-10-CM | POA: Diagnosis not present

## 2014-12-06 DIAGNOSIS — Z791 Long term (current) use of non-steroidal anti-inflammatories (NSAID): Secondary | ICD-10-CM | POA: Diagnosis not present

## 2014-12-06 DIAGNOSIS — R350 Frequency of micturition: Secondary | ICD-10-CM | POA: Diagnosis not present

## 2014-12-06 HISTORY — PX: TRANSURETHRAL RESECTION OF PROSTATE: SHX73

## 2014-12-06 LAB — GLUCOSE, CAPILLARY
GLUCOSE-CAPILLARY: 85 mg/dL (ref 65–99)
Glucose-Capillary: 93 mg/dL (ref 65–99)
Glucose-Capillary: 109 mg/dL — ABNORMAL HIGH (ref 65–99)
Glucose-Capillary: 148 mg/dL — ABNORMAL HIGH (ref 65–99)

## 2014-12-06 SURGERY — TURP (TRANSURETHRAL RESECTION OF PROSTATE)
Anesthesia: General | Wound class: Clean Contaminated

## 2014-12-06 MED ORDER — ACETAMINOPHEN 10 MG/ML IV SOLN
INTRAVENOUS | Status: DC | PRN
  Administered 2014-12-06: 1000 mg via INTRAVENOUS

## 2014-12-06 MED ORDER — HEPARIN SODIUM (PORCINE) 5000 UNIT/ML IJ SOLN
5000.0000 [IU] | Freq: Three times a day (TID) | INTRAMUSCULAR | Status: DC
  Administered 2014-12-06 – 2014-12-07 (×2): 5000 [IU] via SUBCUTANEOUS
  Filled 2014-12-06 (×2): qty 1

## 2014-12-06 MED ORDER — INSULIN ASPART 100 UNIT/ML ~~LOC~~ SOLN
0.0000 [IU] | SUBCUTANEOUS | Status: DC
  Administered 2014-12-06: 2 [IU] via SUBCUTANEOUS
  Filled 2014-12-06: qty 2

## 2014-12-06 MED ORDER — DOCUSATE SODIUM 100 MG PO CAPS
100.0000 mg | ORAL_CAPSULE | Freq: Two times a day (BID) | ORAL | Status: DC
  Administered 2014-12-06 – 2014-12-07 (×2): 100 mg via ORAL
  Filled 2014-12-06 (×2): qty 1

## 2014-12-06 MED ORDER — OXYBUTYNIN CHLORIDE 5 MG PO TABS: 5.0000 mg | ORAL_TABLET | Freq: Three times a day (TID) | ORAL | Status: DC | PRN

## 2014-12-06 MED ORDER — LIDOCAINE HCL (CARDIAC) 20 MG/ML IV SOLN
INTRAVENOUS | Status: DC | PRN
  Administered 2014-12-06: 100 mg via INTRAVENOUS

## 2014-12-06 MED ORDER — MORPHINE SULFATE (PF) 2 MG/ML IV SOLN
2.0000 mg | INTRAVENOUS | Status: DC | PRN
  Administered 2014-12-06: 2 mg via INTRAVENOUS
  Filled 2014-12-06: qty 1

## 2014-12-06 MED ORDER — CIPROFLOXACIN HCL 500 MG PO TABS
500.0000 mg | ORAL_TABLET | Freq: Two times a day (BID) | ORAL | Status: DC
  Administered 2014-12-07 (×2): 500 mg via ORAL
  Filled 2014-12-06 (×2): qty 1

## 2014-12-06 MED ORDER — HYDROCODONE-ACETAMINOPHEN 5-325 MG PO TABS: 1.0000 | ORAL_TABLET | ORAL | Status: DC | PRN

## 2014-12-06 MED ORDER — MIDAZOLAM HCL 2 MG/2ML IJ SOLN
INTRAMUSCULAR | Status: DC | PRN
  Administered 2014-12-06: 2 mg via INTRAVENOUS

## 2014-12-06 MED ORDER — SODIUM CHLORIDE 0.9 % IV SOLN
INTRAVENOUS | Status: DC
  Administered 2014-12-06 – 2014-12-07 (×2): via INTRAVENOUS

## 2014-12-06 MED ORDER — GLYCOPYRROLATE 0.2 MG/ML IJ SOLN
INTRAMUSCULAR | Status: DC | PRN
  Administered 2014-12-06: 0.2 mg via INTRAVENOUS

## 2014-12-06 MED ORDER — LIDOCAINE HCL 2 % EX GEL
CUTANEOUS | Status: DC | PRN
  Administered 2014-12-06: 1 via TOPICAL

## 2014-12-06 MED ORDER — FAMOTIDINE 20 MG PO TABS
ORAL_TABLET | ORAL | Status: AC
  Administered 2014-12-06: 20 mg
  Filled 2014-12-06: qty 1

## 2014-12-06 MED ORDER — METOPROLOL TARTRATE 25 MG PO TABS
12.5000 mg | ORAL_TABLET | Freq: Two times a day (BID) | ORAL | Status: DC
  Administered 2014-12-06 – 2014-12-07 (×2): 12.5 mg via ORAL
  Filled 2014-12-06 (×2): qty 1

## 2014-12-06 MED ORDER — FENTANYL CITRATE (PF) 100 MCG/2ML IJ SOLN
INTRAMUSCULAR | Status: AC
  Administered 2014-12-06: 50 ug via INTRAVENOUS
  Filled 2014-12-06: qty 2

## 2014-12-06 MED ORDER — FENTANYL CITRATE (PF) 100 MCG/2ML IJ SOLN
INTRAMUSCULAR | Status: DC | PRN
  Administered 2014-12-06: 25 ug via INTRAVENOUS
  Administered 2014-12-06: 50 ug via INTRAVENOUS
  Administered 2014-12-06: 25 ug via INTRAVENOUS

## 2014-12-06 MED ORDER — BENAZEPRIL-HYDROCHLOROTHIAZIDE 10-12.5 MG PO TABS: 1.0000 | ORAL_TABLET | Freq: Every day | ORAL | Status: DC

## 2014-12-06 MED ORDER — EPHEDRINE SULFATE 50 MG/ML IJ SOLN
INTRAMUSCULAR | Status: DC | PRN
  Administered 2014-12-06: 15 mg via INTRAVENOUS

## 2014-12-06 MED ORDER — ACETAMINOPHEN 10 MG/ML IV SOLN
INTRAVENOUS | Status: AC
  Filled 2014-12-06: qty 100

## 2014-12-06 MED ORDER — LATANOPROST 0.005 % OP SOLN
1.0000 [drp] | Freq: Every day | OPHTHALMIC | Status: DC
  Administered 2014-12-06: 1 [drp] via OPHTHALMIC
  Filled 2014-12-06: qty 2.5

## 2014-12-06 MED ORDER — SODIUM CHLORIDE 0.9 % IV SOLN
INTRAVENOUS | Status: DC
  Administered 2014-12-06 (×2): via INTRAVENOUS

## 2014-12-06 MED ORDER — ONDANSETRON HCL 4 MG/2ML IJ SOLN
INTRAMUSCULAR | Status: DC | PRN
  Administered 2014-12-06: 4 mg via INTRAVENOUS

## 2014-12-06 MED ORDER — PROPOFOL 10 MG/ML IV BOLUS
INTRAVENOUS | Status: DC | PRN
  Administered 2014-12-06: 150 mg via INTRAVENOUS

## 2014-12-06 MED ORDER — BELLADONNA ALKALOIDS-OPIUM 16.2-60 MG RE SUPP: 1.0000 | Freq: Four times a day (QID) | RECTAL | Status: DC | PRN

## 2014-12-06 MED ORDER — PRAVASTATIN SODIUM 10 MG PO TABS: 10.0000 mg | ORAL_TABLET | Freq: Every day | ORAL | Status: DC

## 2014-12-06 MED ORDER — BENAZEPRIL HCL 20 MG PO TABS
10.0000 mg | ORAL_TABLET | Freq: Every day | ORAL | Status: DC
  Administered 2014-12-07: 10 mg via ORAL
  Filled 2014-12-06: qty 1

## 2014-12-06 MED ORDER — PROMETHAZINE HCL 25 MG/ML IJ SOLN: 6.2500 mg | INTRAMUSCULAR | Status: DC | PRN

## 2014-12-06 MED ORDER — CIPROFLOXACIN HCL 500 MG PO TABS
500.0000 mg | ORAL_TABLET | Freq: Once | ORAL | Status: AC
  Administered 2014-12-06: 500 mg via ORAL

## 2014-12-06 MED ORDER — HYDROCHLOROTHIAZIDE 12.5 MG PO CAPS
12.5000 mg | ORAL_CAPSULE | Freq: Every day | ORAL | Status: DC
  Administered 2014-12-07: 12.5 mg via ORAL
  Filled 2014-12-06: qty 1

## 2014-12-06 MED ORDER — ONDANSETRON HCL 4 MG/2ML IJ SOLN: 4.0000 mg | INTRAMUSCULAR | Status: DC | PRN

## 2014-12-06 MED ORDER — FENTANYL CITRATE (PF) 100 MCG/2ML IJ SOLN
25.0000 ug | INTRAMUSCULAR | Status: DC | PRN
  Administered 2014-12-06: 50 ug via INTRAVENOUS

## 2014-12-06 MED ORDER — FENTANYL CITRATE (PF) 100 MCG/2ML IJ SOLN
25.0000 ug | INTRAMUSCULAR | Status: DC | PRN
  Administered 2014-12-06 (×2): 25 ug via INTRAVENOUS

## 2014-12-06 MED ORDER — CIPROFLOXACIN HCL 500 MG PO TABS
ORAL_TABLET | ORAL | Status: AC
  Administered 2014-12-06: 500 mg via ORAL
  Filled 2014-12-06: qty 1

## 2014-12-06 SURGICAL SUPPLY — 20 items
BACTOSHIELD CHG 4% 4OZ (MISCELLANEOUS) ×2
BAG DRAIN CYSTO-URO LG1000N (MISCELLANEOUS) ×3 IMPLANT
BAG URO DRAIN 2000ML W/SPOUT (MISCELLANEOUS) ×3 IMPLANT
ELECT LOOP MED HF 24F 12D (CUTTING LOOP) IMPLANT
GLOVE BIO SURGEON STRL SZ7 (GLOVE) ×6 IMPLANT
GLOVE BIO SURGEON STRL SZ7.5 (GLOVE) ×3 IMPLANT
GOWN STRL REUS W/ TWL LRG LVL3 (GOWN DISPOSABLE) ×2 IMPLANT
GOWN STRL REUS W/TWL LRG LVL3 (GOWN DISPOSABLE) ×4
KIT RM TURNOVER CYSTO AR (KITS) ×3 IMPLANT
LOOP CUT BIPOLAR 24F LRG (ELECTROSURGICAL) ×3 IMPLANT
PACK CYSTO AR (MISCELLANEOUS) ×3 IMPLANT
PAD GROUND ADULT SPLIT (MISCELLANEOUS) ×3 IMPLANT
ROLLER BALL 3MM 24FR (ELECTROSURGICAL) IMPLANT
SCRUB CHG 4% DYNA-HEX 4OZ (MISCELLANEOUS) ×1 IMPLANT
SET IRRIG Y TYPE TUR BLADDER L (SET/KITS/TRAYS/PACK) ×3 IMPLANT
SOL .9 NS 3000ML IRR  AL (IV SOLUTION) ×8
SOL .9 NS 3000ML IRR UROMATIC (IV SOLUTION) ×4 IMPLANT
SURGILUBE 2OZ TUBE FLIPTOP (MISCELLANEOUS) ×3 IMPLANT
SYRINGE IRR TOOMEY STRL 70CC (SYRINGE) ×3 IMPLANT
WATER STERILE IRR 1000ML POUR (IV SOLUTION) ×3 IMPLANT

## 2014-12-06 NOTE — H&P (View-Only) (Signed)
11/08/2014 11:23 AM   Ross Riley [birth year] [id number]  Referring provider: Luciana Axe, NP [place], [place] [address]  CC: BPH  HPI: Patient is a 64 year old male with a past history ofhistory of BPH status post greenlight laser by Dr. Eliberto Ivory 3-4 years ago and episodic gross hematuria. He underwent office cystoscopy one year ago which showed regrowth of the median lobe and was determined the likely source of his gross hematuria as well. He also at that time had a punctate left mid pole stone and a renal ultrasound. Since his was last seen he did have a dysuria and was treated in the ED for a positive chlamydia culture on 11/01/2014.  He reports that this has resolved.  On follow-up today he was noted to have an I-PSS score of 28 with overall quality of life of 6.  He is very bothered by his symptoms particularly as frequency, intermittency, urgency, and weak stream and nocturia 3 times per night. He still notes intermittent gross hematuria.  PMH: Past Medical History  Diagnosis Date  . Hypertension   . Diabetes mellitus without complication   . Hyperlipidemia   . Arthritis   . GERD (gastroesophageal reflux disease)   . Dystonia   . ED (erectile dysfunction)   . BPH (benign prostatic hyperplasia)   . Hematuria     Surgical History: Past Surgical History  Procedure Laterality Date  . Prostate surgery  2011  . Kidney stone surgery  2009    Home Medications:    Medication List       This list is accurate as of: 11/08/14 11:23 AM.  Always use your most recent med list.               aspirin 81 MG tablet  Take 1 tablet (81 mg total) by mouth daily.     benazepril-hydrochlorthiazide 10-12.5 MG per tablet  Commonly known as:  LOTENSIN HCT  Take 1 tablet by mouth daily.     ciprofloxacin 250 MG tablet  Commonly known as:  CIPRO  Take 1 tablet (250 mg total) by mouth every 12 (twelve) hours.     glipiZIDE 2.5 MG 24 hr tablet  Commonly known as:   GLUCOTROL XL  Take 1 tablet (2.5 mg total) by mouth daily with breakfast.     lovastatin 20 MG tablet  Commonly known as:  MEVACOR  Take 1 tablet (20 mg total) by mouth at bedtime.     metoprolol tartrate 25 MG tablet  Commonly known as:  LOPRESSOR  Take 0.5 tablets (12.5 mg total) by mouth 2 (two) times daily.     naproxen 500 MG tablet  Commonly known as:  NAPROSYN  Take by mouth.     omeprazole 20 MG capsule  Commonly known as:  PRILOSEC        Allergies:  Allergies  Allergen Reactions  . Metformin And Related Other (See Comments)    Headache and dizzines  . Metformin Hcl     headache and dizziness    Family History: No family history on file.  Social History:  reports that he quit smoking about 23 years ago. His smoking use included Cigarettes. He has a 10 pack-year smoking history. He has never used smokeless tobacco. He reports that he drinks alcohol. He reports that he does not use illicit drugs.  ROS: UROLOGY Frequent Urination?: Yes Hard to postpone urination?: Yes Burning/pain with urination?: Yes Get up at night to urinate?: No Leakage  of urine?: Yes Urine stream starts and stops?: No Trouble starting stream?: No Do you have to strain to urinate?: No Blood in urine?: No Urinary tract infection?: No Sexually transmitted disease?: No Injury to kidneys or bladder?: No Painful intercourse?: No Weak stream?: Yes Erection problems?: No Penile pain?: No  Gastrointestinal Nausea?: No Vomiting?: No Indigestion/heartburn?: No Diarrhea?: No Constipation?: No  Constitutional Fever: No Night sweats?: No Weight loss?: No Fatigue?: No  Skin Skin rash/lesions?: No Itching?: No  Eyes Blurred vision?: No Double vision?: No  Ears/Nose/Throat Sore throat?: No Sinus problems?: No  Hematologic/Lymphatic Swollen glands?: No Easy bruising?: No  Cardiovascular Leg swelling?: No Chest pain?: No  Respiratory Cough?: No Shortness of breath?:  No  Endocrine Excessive thirst?: No  Musculoskeletal Back pain?: No Joint pain?: No  Neurological Headaches?: No Dizziness?: No  Psychologic Depression?: No Anxiety?: No  Physical Exam: BP 125/85 mmHg  Pulse 65  Ht 5\' 6"  (1.676 m)  Wt 156 lb 6.4 oz (70.943 kg)  BMI 25.26 kg/m2  Constitutional:  Alert and oriented, No acute distress. HEENT: [place] AT, moist mucus membranes.  Trachea midline, no masses. Cardiovascular: No clubbing, cyanosis, or edema. Respiratory: Normal respiratory effort, no increased work of breathing. GI: Abdomen is soft, nontender, nondistended, no abdominal masses GU: No CVA tenderness. Normal phallus.  Testicles descended bilaterally. DRE: 2+ prostate, smooth. No nodules. Skin: No rashes, bruises or suspicious lesions. Lymph: No cervical or inguinal adenopathy. Neurologic: Grossly intact, no focal deficits, moving all 4 extremities. Psychiatric: Normal mood and affect.  Laboratory Data: Lab Results  Component Value Date   WBC 6.3 12/07/2012   HGB 15.2 12/07/2012   HCT 43.8 12/07/2012   MCV 90 12/07/2012   PLT 194 12/07/2012    Lab Results  Component Value Date   CREATININE 0.75* 10/27/2014    No results found for: PSA  No results found for: TESTOSTERONE  Lab Results  Component Value Date   HGBA1C 6.2 10/26/2014    Urinalysis    Component Value Date/Time   COLORURINE YELLOW 10/31/2014 1035   COLORURINE Yellow 12/07/2012 1602   APPEARANCEUR HAZY* 10/31/2014 1035   APPEARANCEUR Clear 12/07/2012 1602   LABSPEC 1.025 10/31/2014 1035   LABSPEC 1.015 12/07/2012 1602   PHURINE 6.5 10/31/2014 1035   PHURINE 6.0 12/07/2012 1602   GLUCOSEU NEGATIVE 10/31/2014 1035   GLUCOSEU Negative 12/07/2012 1602   HGBUR 3+* 10/31/2014 1035   HGBUR Negative 12/07/2012 1602   BILIRUBINUR NEGATIVE 10/31/2014 1035   BILIRUBINUR Negative 12/07/2012 1602   KETONESUR NEGATIVE 10/31/2014 1035   KETONESUR Negative 12/07/2012 1602   PROTEINUR 100*  10/31/2014 1035   PROTEINUR Negative 12/07/2012 1602   NITRITE NEGATIVE 10/31/2014 1035   NITRITE Negative 12/07/2012 1602   LEUKOCYTESUR 1+* 10/31/2014 1035   LEUKOCYTESUR Negative 12/07/2012 1602   Uroflow Qmax: 13 ml/s  Assessment & Plan:    1. BPH (benign prostatic hyperplasia) Previous failure of greenlight laser. Also has regrowth of median lobe. We discussed in great detail options for him on in terms of better management of his severe symptoms. We discussed in detail how a transurethral resection of prostate may be the best treatment for him given his large median lobe and failure to respond to green light laser therapy for an extended period of time. He was given a handout in both [place] and Spanish on the transurethral resection of prostate procedure and all questions were answered. He was informed, among other things, the risks of retrograde ejaculation and bleeding. He  was also informed that he will remain in the hospital overnight with a catheter. We will need to in the future ensure that his hematuria improves following this procedure, or he may need a more formal hematuria workup.  2. PSA Screening -will re-check annual PSA today  Follow up for TURP.   Nickie Retort, MD  [facility] [address], [place],  [address] [phone]

## 2014-12-06 NOTE — Progress Notes (Signed)
MEDICATION RELATED CONSULT NOTE - INITIAL   Pharmacy Consult for renal dosing of antibiotics Indication: TURP post-operative orders  Allergies  Allergen Reactions  . Metformin And Related Other (See Comments)    Headache and dizzines  . Metformin Hcl Other (See Comments)    headache and dizziness    Patient Measurements: Height: 5\' 6"  (167.6 cm) Weight: 160 lb (72.576 kg) IBW/kg (Calculated) : 63.8   Labs: Estimated Creatinine Clearance: 77.5 mL/min (by C-G formula based on Cr of 0.88).  Medications:  Anti-infectives    Start     Dose/Rate Route Frequency Ordered Stop   12/06/14 1530  ciprofloxacin (CIPRO) tablet 500 mg     500 mg Oral Every 12 hours 12/06/14 1527 12/08/14 0329   12/06/14 1145  ciprofloxacin (CIPRO) tablet 500 mg     500 mg Oral  Once 12/06/14 1142 12/06/14 1150      Assessment: Pharmacy consulted to monitor renal function and adjust antibiotics per renal function if needed.  Plan:  Patient prescribed ciprofloxacin 500 mg PO q12h. Will continue current dose - is appropriate for renal function.  Darylene Price Cheyanne Lamison 12/06/2014,3:42 PM

## 2014-12-06 NOTE — Transfer of Care (Signed)
Immediate Anesthesia Transfer of Care Note  Patient: Ross Riley  Procedure(s) Performed: Procedure(s): TRANSURETHRAL RESECTION OF THE PROSTATE (TURP) (N/A)  Patient Location: PACU  Anesthesia Type:General  Level of Consciousness: sedated  Airway & Oxygen Therapy: Patient Spontanous Breathing and Patient connected to face mask oxygen  Post-op Assessment: Report given to RN and Post -op Vital signs reviewed and stable  Post vital signs: Reviewed and stable  Last Vitals:  Filed Vitals:   12/06/14 1309  BP: 129/75  Pulse: 62  Temp: 36 C  Resp: 17    Complications: No apparent anesthesia complications

## 2014-12-06 NOTE — Progress Notes (Signed)
Pt refuses bed alarm. Educated pt on importance of safety precautions.

## 2014-12-06 NOTE — Anesthesia Procedure Notes (Signed)
Procedure Name: LMA Insertion Date/Time: 12/06/2014 12:00 PM Performed by: Doreen Salvage Pre-anesthesia Checklist: Patient identified, Patient being monitored, Timeout performed, Emergency Drugs available and Suction available Patient Re-evaluated:Patient Re-evaluated prior to inductionOxygen Delivery Method: Circle system utilized Preoxygenation: Pre-oxygenation with 100% oxygen Intubation Type: IV induction Ventilation: Mask ventilation without difficulty LMA: LMA inserted LMA Size: 4.0 Tube type: Oral Number of attempts: 1 Placement Confirmation: positive ETCO2 and breath sounds checked- equal and bilateral Tube secured with: Tape Dental Injury: Teeth and Oropharynx as per pre-operative assessment

## 2014-12-06 NOTE — Op Note (Signed)
Preoperative diagnosis: Benign prostatic hyperplasia next  Postoperative diagnosis: Same  Procedure: Transurethral resection of prostate  Surgeon: Brian Budzyn, M.D.  Findings: Obstructive median lobe that was successfully resected  Drains: 24 French three-way catheter on CBI  Specimen: Prostate chips to pathology  Estimated blood loss: Minimal  Disposition: Stable to postanesthesia care unit  Indications for procedure: The patient is a 64-year-old male with a past medical history of greenlight laser to his prostate. He presents today after symptomatically regrowth of his prostate.  Description of procedure: The patient was met in the preoperative area. All risks benefits and indications of the procedure were described in great detail. The patient consented to the procedure. Preoperative antibiotics given. The patient was taken to the operative theater. Anesthesia was induced per anesthesia service. Patient then placed in dorsal 5 position and prepped and draped in sterile fashion. A timeout was called. A 24 French resectoscope with visual obturator was placed per urethra atraumatically. The resectoscope loop was then exchanged the visual operator. This undescended patient had a large regrowth of his median lobe. Normal growth of his lateral lobes. His median lobe was then resected. The lateral lobes were resected to the level of the verumontanum. Capsule was visible. After complete drainage 360 resection the prostatic fossa was visually open. All prostate chips were irrigated. Hemostasis was obtained. At the end of the procedure, the ureteral orifices bilaterally as well as the verumontanum were noted to be intact. The resectoscope was then removed and the 24 French three-way hematuria catheter was placed. The urine was clear. He was started on CBI. He was awoken from anesthesia and transferred in stable condition to postanesthesia care unit.  Plan: The patient will be admitted overnight.  We will wean his CBI and his condition his catheter tomorrow. He had intermittent hematuria prior to this procedure which was believed to be from his prostate. If this does not resolve, he will need a formal hematuria workup. We'll also recheck his PSA which was elevated. This will be done as an outpatient 

## 2014-12-06 NOTE — Anesthesia Preprocedure Evaluation (Signed)
Anesthesia Evaluation  Patient identified by MRN, date of birth, ID band Patient awake    Reviewed: Allergy & Precautions, H&P , NPO status , Patient's Chart, lab work & pertinent test results, reviewed documented beta blocker date and time   History of Anesthesia Complications Negative for: history of anesthetic complications  Airway Mallampati: III  TM Distance: >3 FB Neck ROM: full    Dental no notable dental hx. (+) Teeth Intact   Pulmonary neg shortness of breath, neg sleep apnea, neg COPD, neg recent URI, former smoker,    Pulmonary exam normal breath sounds clear to auscultation       Cardiovascular Exercise Tolerance: Good hypertension, (-) angina(-) CAD, (-) Past MI, (-) Cardiac Stents and (-) CABG Normal cardiovascular exam(-) dysrhythmias (-) Valvular Problems/Murmurs Rhythm:regular Rate:Normal     Neuro/Psych negative neurological ROS  negative psych ROS   GI/Hepatic Neg liver ROS, GERD  Medicated and Controlled,  Endo/Other  diabetes  Renal/GU CRFRenal disease  negative genitourinary   Musculoskeletal   Abdominal   Peds  Hematology negative hematology ROS (+)   Anesthesia Other Findings Past Medical History:   Hypertension                                                 Diabetes mellitus without complication (HCC)                 Hyperlipidemia                                               Arthritis                                                    GERD (gastroesophageal reflux disease)                       Dystonia                                                     ED (erectile dysfunction)                                    BPH (benign prostatic hyperplasia)                           Hematuria                                                    Chronic kidney disease  Comment:kidney stones   Glaucoma (increased eye pressure)                            Reproductive/Obstetrics negative OB ROS                             Anesthesia Physical Anesthesia Plan  ASA: III  Anesthesia Plan: General   Post-op Pain Management:    Induction:   Airway Management Planned:   Additional Equipment:   Intra-op Plan:   Post-operative Plan:   Informed Consent: I have reviewed the patients History and Physical, chart, labs and discussed the procedure including the risks, benefits and alternatives for the proposed anesthesia with the patient or authorized representative who has indicated his/her understanding and acceptance.   Dental Advisory Given  Plan Discussed with: Anesthesiologist, CRNA and Surgeon  Anesthesia Plan Comments:         Anesthesia Quick Evaluation

## 2014-12-06 NOTE — Interval H&P Note (Signed)
History and Physical Interval Note:  12/06/2014 11:26 AM  Ross Riley  has presented today for surgery, with the diagnosis of BPH  The various methods of treatment have been discussed with the patient and family. After consideration of risks, benefits and other options for treatment, the patient has consented to  Procedure(s): TRANSURETHRAL RESECTION OF THE PROSTATE (TURP) (N/A) as a surgical intervention .  The patient's history has been reviewed, patient examined, no change in status, stable for surgery.  I have reviewed the patient's chart and labs.  Questions were answered to the patient's satisfaction.     RRR Unlabored respirations  Ross Riley

## 2014-12-07 ENCOUNTER — Telehealth: Payer: Self-pay

## 2014-12-07 ENCOUNTER — Encounter: Payer: Self-pay | Admitting: Urology

## 2014-12-07 DIAGNOSIS — E1122 Type 2 diabetes mellitus with diabetic chronic kidney disease: Secondary | ICD-10-CM | POA: Diagnosis not present

## 2014-12-07 DIAGNOSIS — I129 Hypertensive chronic kidney disease with stage 1 through stage 4 chronic kidney disease, or unspecified chronic kidney disease: Secondary | ICD-10-CM | POA: Diagnosis not present

## 2014-12-07 DIAGNOSIS — N401 Enlarged prostate with lower urinary tract symptoms: Secondary | ICD-10-CM | POA: Diagnosis not present

## 2014-12-07 DIAGNOSIS — N183 Chronic kidney disease, stage 3 (moderate): Secondary | ICD-10-CM | POA: Diagnosis not present

## 2014-12-07 DIAGNOSIS — R3915 Urgency of urination: Secondary | ICD-10-CM | POA: Diagnosis not present

## 2014-12-07 DIAGNOSIS — E785 Hyperlipidemia, unspecified: Secondary | ICD-10-CM | POA: Diagnosis not present

## 2014-12-07 DIAGNOSIS — R3912 Poor urinary stream: Secondary | ICD-10-CM | POA: Diagnosis not present

## 2014-12-07 DIAGNOSIS — R351 Nocturia: Secondary | ICD-10-CM | POA: Diagnosis not present

## 2014-12-07 DIAGNOSIS — R350 Frequency of micturition: Secondary | ICD-10-CM | POA: Diagnosis not present

## 2014-12-07 LAB — CBC
HEMATOCRIT: 38.9 % — AB (ref 40.0–52.0)
HEMOGLOBIN: 13 g/dL (ref 13.0–18.0)
MCH: 29.6 pg (ref 26.0–34.0)
MCHC: 33.4 g/dL (ref 32.0–36.0)
MCV: 88.7 fL (ref 80.0–100.0)
Platelets: 191 10*3/uL (ref 150–440)
RBC: 4.38 MIL/uL — ABNORMAL LOW (ref 4.40–5.90)
RDW: 13.3 % (ref 11.5–14.5)
WBC: 6 10*3/uL (ref 3.8–10.6)

## 2014-12-07 LAB — BASIC METABOLIC PANEL
ANION GAP: 6 (ref 5–15)
BUN: 11 mg/dL (ref 6–20)
CHLORIDE: 108 mmol/L (ref 101–111)
CO2: 27 mmol/L (ref 22–32)
Calcium: 8.3 mg/dL — ABNORMAL LOW (ref 8.9–10.3)
Creatinine, Ser: 0.94 mg/dL (ref 0.61–1.24)
GFR calc non Af Amer: >60 mL/min (ref >60)
GLUCOSE: 122 mg/dL — AB (ref 65–99)
Potassium: 3.4 mmol/L — ABNORMAL LOW (ref 3.5–5.1)
Sodium: 141 mmol/L (ref 135–145)

## 2014-12-07 LAB — SURGICAL PATHOLOGY

## 2014-12-07 LAB — GLUCOSE, CAPILLARY
GLUCOSE-CAPILLARY: 112 mg/dL — AB (ref 65–99)
GLUCOSE-CAPILLARY: 99 mg/dL (ref 65–99)
Glucose-Capillary: 100 mg/dL — ABNORMAL HIGH (ref 65–99)

## 2014-12-07 MED ORDER — INFLUENZA VAC SPLIT QUAD 0.5 ML IM SUSY: 0.5000 mL | PREFILLED_SYRINGE | INTRAMUSCULAR | Status: DC

## 2014-12-07 MED ORDER — INFLUENZA VAC SPLIT QUAD 0.5 ML IM SUSY
0.5000 mL | PREFILLED_SYRINGE | INTRAMUSCULAR | Status: AC | PRN
  Administered 2014-12-07: 0.5 mL via INTRAMUSCULAR
  Filled 2014-12-07: qty 0.5

## 2014-12-07 MED ORDER — HYDROCODONE-ACETAMINOPHEN 5-325 MG PO TABS: 1.0000 | ORAL_TABLET | ORAL | Status: DC | PRN

## 2014-12-07 MED ORDER — DOCUSATE SODIUM 100 MG PO CAPS: 100.0000 mg | ORAL_CAPSULE | Freq: Two times a day (BID) | ORAL | Status: DC

## 2014-12-07 NOTE — Anesthesia Postprocedure Evaluation (Signed)
  Anesthesia Post-op Note  Patient: Ross Riley  Procedure(s) Performed: Procedure(s): TRANSURETHRAL RESECTION OF THE PROSTATE (TURP) (N/A)  Anesthesia type:General  Patient location: PACU  Post pain: Pain level controlled  Post assessment: Post-op Vital signs reviewed, Patient's Cardiovascular Status Stable, Respiratory Function Stable, Patent Airway and No signs of Nausea or vomiting  Post vital signs: Reviewed and stable  Last Vitals:  Filed Vitals:   12/07/14 0633  BP: 112/68  Pulse: 52  Temp: 36.8 C  Resp:     Level of consciousness: awake, alert  and patient cooperative  Complications: No apparent anesthesia complications

## 2014-12-07 NOTE — Progress Notes (Signed)
Pt stable. Voiding. PVR was 3mls. D/c instructions given and education provided. IV removed. Flu vaccine given. Signed prescriptions given. Will be escorted out by staff and driven home by family.

## 2014-12-07 NOTE — Progress Notes (Signed)
No events overnight Doing well No f/c/n/c Pain contolled  Filed Vitals:   12/06/14 2119 12/06/14 2122 12/07/14 0205 12/07/14 0633  BP: 101/60 101/60 104/69 112/68  Pulse: 62 62 54 52  Temp:   98.3 F (36.8 C) 98.2 F (36.8 C)  TempSrc:   Oral Oral  Resp:   14   Height:      Weight:      SpO2:   99% 99%   CBI  NAD Soft NT ND Foley clear minimal CBI  CBC    Component Value Date/Time   WBC 6.0 12/07/2014 0405   WBC 6.3 12/07/2012 1602   RBC 4.38* 12/07/2014 0405   RBC 4.90 12/07/2012 1602   HGB 13.0 12/07/2014 0405   HGB 15.2 12/07/2012 1602   HCT 38.9* 12/07/2014 0405   HCT 43.8 12/07/2012 1602   PLT 191 12/07/2014 0405   PLT 194 12/07/2012 1602   MCV 88.7 12/07/2014 0405   MCV 90 12/07/2012 1602   MCH 29.6 12/07/2014 0405   MCH 31.0 12/07/2012 1602   MCHC 33.4 12/07/2014 0405   MCHC 34.6 12/07/2012 1602   RDW 13.3 12/07/2014 0405   RDW 12.3 12/07/2012 1602   LYMPHSABS 3.3 11/27/2014 0921   MONOABS 0.5 11/27/2014 0921   EOSABS 0.2 11/27/2014 0921   BASOSABS 0.0 11/27/2014 0921   BMP Latest Ref Rng 12/07/2014 11/27/2014 10/27/2014  Glucose 65 - 99 mg/dL 122(H) 99 99  BUN 6 - 20 mg/dL 11 13 11   Creatinine 0.61 - 1.24 mg/dL 0.94 0.88 0.75(L)  BUN/Creat Ratio 10 - 22 - - 15  Sodium 135 - 145 mmol/L 141 140 141  Potassium 3.5 - 5.1 mmol/L 3.4(L) 4.1 4.1  Chloride 101 - 111 mmol/L 108 104 101  CO2 22 - 32 mmol/L 27 29 23   Calcium 8.9 - 10.3 mg/dL 8.3(L) 9.2 9.2    POD 1 TURP. Doing well -d/c foley -check PVR -if < 200 cc, d/c home today

## 2014-12-07 NOTE — Discharge Summary (Signed)
Date of admission: 12/06/2014  Date of discharge: 12/07/2014  Admission diagnosis: BPH  Discharge diagnosis: BPH  Secondary diagnoses:  Patient Active Problem List   Diagnosis Date Noted  . BPH (benign prostatic hyperplasia) 12/06/2014  . Pain in joint, ankle and foot 10/26/2014  . Gonalgia 10/26/2014  . H/O arthritis 10/26/2014  . Bilateral cataracts 10/26/2014  . Dystonia 10/26/2014  . ED (erectile dysfunction) of organic origin 10/26/2014  . Acid reflux 10/26/2014  . Hyperlipidemia 10/26/2014  . Benign hypertension with chronic kidney disease, stage II 10/26/2014  . Diabetes ([place]) 08/02/2014  . Focal dystonia 07/22/2012    History and Physical: For full details, please see admission history and physical.  Hospital Course: Patient tolerated the procedure well.  He was then transferred to the floor after an uneventful PACU stay.  His hospital course was uncomplicated.  On POD#1  he had met discharge criteria: was eating a regular diet, was up and ambulating independently,  pain was well controlled, was voiding without a catheter, and was ready to for discharge.   Laboratory values:   Recent Labs  12/07/14 0405  WBC 6.0  HGB 13.0  HCT 38.9*    Recent Labs  12/07/14 0405  NA 141  K 3.4*  CL 108  CO2 27  GLUCOSE 122*  BUN 11  CREATININE 0.94  CALCIUM 8.3*   No results for input(s): LABPT, INR in the last 72 hours. No results for input(s): LABURIN in the last 72 hours. Results for orders placed or performed during the hospital encounter of 10/31/14  Urine culture     Status: None   Collection Time: 10/31/14 10:35 AM  Result Value Ref Range Status   Specimen Description URINE, CLEAN CATCH  Final   Special Requests Normal  Final   Culture NO GROWTH 10 DAYS  Final   Report Status 11/10/2014 FINAL  Final  Chlamydia/NGC rt PCR     Status: Abnormal   Collection Time: 10/31/14 10:35 AM  Result Value Ref Range Status   Specimen source GC/Chlam URINE, RANDOM  Final    Chlamydia Tr DETECTED (A) NOT DETECTED Final    Comment: CRITICAL RESULT CALLED TO, READ BACK BY AND VERIFIED WITH: ANNIE HODGES AT 0901 ON 11/01/14.Marland KitchenMarland KitchenCedarville    N gonorrhoeae NOT DETECTED NOT DETECTED Final    Comment: (NOTE) 100  This methodology has not been evaluated in pregnant women or in 200  patients with a history of hysterectomy. 300 400  This methodology will not be performed on patients less than 71  years of age.     Disposition: Home  Discharge instruction: The patient was instructed to be ambulatory but told to refrain from heavy lifting, strenuous activity, or driving.   Discharge medications:   Medication List    TAKE these medications        aspirin 81 MG tablet  Take 1 tablet (81 mg total) by mouth daily.     benazepril-hydrochlorthiazide 10-12.5 MG tablet  Commonly known as:  LOTENSIN HCT  Take 1 tablet by mouth daily.     docusate sodium 100 MG capsule  Commonly known as:  COLACE  Take 1 capsule (100 mg total) by mouth 2 (two) times daily.     glipiZIDE 2.5 MG 24 hr tablet  Commonly known as:  GLUCOTROL XL  Take 1 tablet (2.5 mg total) by mouth daily with breakfast.     HYDROcodone-acetaminophen 5-325 MG tablet  Commonly known as:  NORCO/VICODIN  Take 1-2 tablets by mouth every  4 (four) hours as needed for moderate pain.     lovastatin 20 MG tablet  Commonly known as:  MEVACOR  Take 1 tablet (20 mg total) by mouth at bedtime.     LUMIGAN 0.01 % Soln  Generic drug:  bimatoprost  Place 1 drop into both eyes at bedtime.     metoprolol tartrate 25 MG tablet  Commonly known as:  LOPRESSOR  Take 0.5 tablets (12.5 mg total) by mouth 2 (two) times daily.     naproxen 500 MG tablet  Commonly known as:  NAPROSYN  Take by mouth.        Followup:      Follow-up Information    Follow up with [facility] On 12/27/2014.   Why:  with Dr. Pilar Jarvis @ [facility] information:   [address] [place] [address] [phone]

## 2014-12-07 NOTE — Telephone Encounter (Signed)
-----   Message from Nickie Retort, MD sent at 12/07/2014  8:41 AM EDT ----- Mr. Lightsey needs to f/u in 1-2 weeks for post op visit

## 2014-12-07 NOTE — Care Management Important Message (Signed)
Important Message  Patient Details  Name: Ross Riley MRN: [id number] Date of Birth: 02/21/1951   Medicare Important Message Given:  Yes-second notification given    Alvie Heidelberg, RN 12/07/2014, 10:46 AM

## 2014-12-18 ENCOUNTER — Telehealth: Payer: Self-pay | Admitting: Family Medicine

## 2014-12-18 DIAGNOSIS — H401132 Primary open-angle glaucoma, bilateral, moderate stage: Secondary | ICD-10-CM | POA: Diagnosis not present

## 2014-12-18 NOTE — Telephone Encounter (Signed)
Pam at [facility] needs a [facility] referral for pt to see Dr. Marshell Garfinkel for glaucoma.  Her call back number is [phone]

## 2014-12-18 NOTE — Telephone Encounter (Signed)
[employer] Referral has been submitted: [id number]

## 2014-12-27 ENCOUNTER — Ambulatory Visit (INDEPENDENT_AMBULATORY_CARE_PROVIDER_SITE_OTHER): Payer: Commercial Managed Care - HMO | Admitting: Obstetrics and Gynecology

## 2014-12-27 ENCOUNTER — Encounter: Payer: Self-pay | Admitting: Obstetrics and Gynecology

## 2014-12-27 ENCOUNTER — Ambulatory Visit: Payer: Commercial Managed Care - HMO

## 2014-12-27 VITALS — BP 132/86 | HR 57 | Resp 16 | Ht 66.0 in | Wt 157.4 lb

## 2014-12-27 DIAGNOSIS — N400 Enlarged prostate without lower urinary tract symptoms: Secondary | ICD-10-CM | POA: Diagnosis not present

## 2014-12-27 LAB — BLADDER SCAN AMB NON-IMAGING

## 2014-12-27 NOTE — Progress Notes (Signed)
12/27/2014 10:42 AM   Arlis Porta 1950-05-21 [id number]  Referring provider: Luciana Axe, NP [place], [place] [address]  Chief Complaint  Patient presents with  . Benign Prostatic Hypertrophy  . Routine Post Op    TURP    HPI: Patient is a 64yo male presenting status post TURP by Dr. Pilar Jarvis on 12/06/14. Patient reports much improvement in his urinary symptoms compared those prior to surgery. No fevers. Minimal gross hematuria. He is now voiding every 2 hours, urgency and nocturia twice per night. No sensation of incomplete bladder emptying.  I-PSS- 10 QOL mixed  PMH: Past Medical History  Diagnosis Date  . Hypertension   . Diabetes mellitus without complication ([place])   . Hyperlipidemia   . Arthritis   . GERD (gastroesophageal reflux disease)   . Dystonia   . ED (erectile dysfunction)   . BPH (benign prostatic hyperplasia)   . Hematuria   . Chronic kidney disease     kidney stones  . Glaucoma (increased eye pressure)     Surgical History: Past Surgical History  Procedure Laterality Date  . Prostate surgery  2011  . Kidney stone surgery  2009  . Extracorporeal shock wave lithotripsy Right   . Transurethral resection of prostate N/A 12/06/2014    Procedure: TRANSURETHRAL RESECTION OF THE PROSTATE (TURP);  Surgeon: Nickie Retort, MD;  Location: [facility] ORS;  Service: Urology;  Laterality: N/A;    Home Medications:    Medication List       This list is accurate as of: 12/27/14 10:42 AM.  Always use your most recent med list.               aspirin 81 MG tablet  Take 1 tablet (81 mg total) by mouth daily.     benazepril-hydrochlorthiazide 10-12.5 MG tablet  Commonly known as:  LOTENSIN HCT  Take 1 tablet by mouth daily.     docusate sodium 100 MG capsule  Commonly known as:  COLACE  Take 1 capsule (100 mg total) by mouth 2 (two) times daily.     glipiZIDE 2.5 MG 24 hr tablet  Commonly known as:  GLUCOTROL XL  Take 1 tablet (2.5 mg  total) by mouth daily with breakfast.     lovastatin 20 MG tablet  Commonly known as:  MEVACOR  Take 1 tablet (20 mg total) by mouth at bedtime.     LUMIGAN 0.01 % Soln  Generic drug:  bimatoprost  Place 1 drop into both eyes at bedtime.     metoprolol tartrate 25 MG tablet  Commonly known as:  LOPRESSOR  Take 0.5 tablets (12.5 mg total) by mouth 2 (two) times daily.        Allergies:  Allergies  Allergen Reactions  . Metformin And Related Other (See Comments)    Headache and dizzines  . Metformin Hcl Other (See Comments)    headache and dizziness    Family History: No family history on file.  Social History:  reports that he quit smoking about 23 years ago. His smoking use included Cigarettes. He has a 10 pack-year smoking history. He has never used smokeless tobacco. He reports that he drinks alcohol. He reports that he does not use illicit drugs.  ROS: UROLOGY Frequent Urination?: Yes Hard to postpone urination?: No Burning/pain with urination?: Yes Get up at night to urinate?: Yes Leakage of urine?: No Urine stream starts and stops?: No Trouble starting stream?: No Do you have to strain to  urinate?: No Blood in urine?: Yes Urinary tract infection?: No Sexually transmitted disease?: No Injury to kidneys or bladder?: No Painful intercourse?: No Weak stream?: No Erection problems?: Yes Penile pain?: No  Gastrointestinal Nausea?: No Vomiting?: No Indigestion/heartburn?: No Diarrhea?: No Constipation?: No  Constitutional Fever: No Night sweats?: No Weight loss?: No Fatigue?: No  Skin Skin rash/lesions?: No Itching?: No  Eyes Blurred vision?: Yes Double vision?: No  Ears/Nose/Throat Sore throat?: No Sinus problems?: No  Hematologic/Lymphatic Swollen glands?: No Easy bruising?: No  Cardiovascular Leg swelling?: No Chest pain?: No  Respiratory Cough?: No Shortness of breath?: No  Endocrine Excessive thirst?:  No  Musculoskeletal Back pain?: No Joint pain?: No  Neurological Headaches?: No Dizziness?: No  Psychologic Depression?: No Anxiety?: No  Physical Exam: BP 132/86 mmHg  Pulse 57  Resp 16  Ht 5\' 6"  (1.676 m)  Wt 157 lb 6.4 oz (71.396 kg)  BMI 25.42 kg/m2  Constitutional:  Alert and oriented, No acute distress. HEENT: [place] AT, moist mucus membranes.  Trachea midline, no masses. Cardiovascular: No clubbing, cyanosis, or edema. Respiratory: Normal respiratory effort, no increased work of breathing. Skin: No rashes, bruises or suspicious lesions. Lymph: No cervical or inguinal adenopathy. Neurologic: Grossly intact, no focal deficits, moving all 4 extremities. Psychiatric: Normal mood and affect.  Laboratory Data:   Urinalysis    Component Value Date/Time   COLORURINE YELLOW 10/31/2014 1035   COLORURINE Yellow 12/07/2012 1602   APPEARANCEUR HAZY* 10/31/2014 1035   APPEARANCEUR Clear 12/07/2012 1602   LABSPEC 1.025 10/31/2014 1035   LABSPEC 1.015 12/07/2012 1602   PHURINE 6.5 10/31/2014 1035   PHURINE 6.0 12/07/2012 1602   GLUCOSEU NEGATIVE 10/31/2014 1035   GLUCOSEU Negative 12/07/2012 1602   HGBUR 3+* 10/31/2014 1035   HGBUR Negative 12/07/2012 1602   BILIRUBINUR NEGATIVE 10/31/2014 1035   BILIRUBINUR Negative 12/07/2012 [place] 10/31/2014 1035   KETONESUR Negative 12/07/2012 1602   PROTEINUR 100* 10/31/2014 1035   PROTEINUR Negative 12/07/2012 1602   NITRITE NEGATIVE 10/31/2014 1035   NITRITE Negative 12/07/2012 1602   LEUKOCYTESUR 1+* 10/31/2014 1035   LEUKOCYTESUR Negative 12/07/2012 1602    Pertinent Imaging:   Assessment & Plan:    1. BPH (benign prostatic hyperplasia)- Status post TURP by Dr. Ivory Broad on 12/06/14. Postoperative course has been uneventful. Patient reports improvement in urinary symptoms. He will follow up in 3 months for recheck. - BLADDER SCAN AMB NON-IMAGING   Return in about 4 months (around 04/29/2015) for  I-PSS/PVR/Uroflow.  These notes generated with voice recognition software. I apologize for typographical errors.  Herbert Moors, [facility] [address] [place],  [address] [phone]

## 2015-01-12 ENCOUNTER — Encounter: Payer: Self-pay | Admitting: Family Medicine

## 2015-01-12 ENCOUNTER — Ambulatory Visit (INDEPENDENT_AMBULATORY_CARE_PROVIDER_SITE_OTHER): Payer: Commercial Managed Care - HMO | Admitting: Family Medicine

## 2015-01-12 ENCOUNTER — Other Ambulatory Visit: Payer: Self-pay | Admitting: Family Medicine

## 2015-01-12 VITALS — BP 124/81 | HR 54 | Temp 98.2°F | Resp 16 | Ht 66.0 in | Wt 158.0 lb

## 2015-01-12 DIAGNOSIS — J392 Other diseases of pharynx: Secondary | ICD-10-CM

## 2015-01-12 DIAGNOSIS — K219 Gastro-esophageal reflux disease without esophagitis: Secondary | ICD-10-CM

## 2015-01-12 MED ORDER — LORATADINE 10 MG PO TABS: 10.0000 mg | ORAL_TABLET | Freq: Every day | ORAL | Status: DC

## 2015-01-12 MED ORDER — FLUTICASONE PROPIONATE 50 MCG/ACT NA SUSP: 2.0000 | Freq: Every day | NASAL | Status: DC

## 2015-01-12 MED ORDER — OMEPRAZOLE 20 MG PO CPDR: 20.0000 mg | DELAYED_RELEASE_CAPSULE | Freq: Every day | ORAL | Status: DC

## 2015-01-12 NOTE — Progress Notes (Signed)
Subjective:    Patient ID: KARDIN KRAVEC, male    DOB: 07/20/1950, 64 y.o.   MRN: [id number]  HPI: Ross Riley is a 64 y.o. male presenting on 01/12/2015 for throat pain   HPI  Pt presents to evaluate sensation that there is something in the back of his throat. Sensation has been present for 2-3 months. Wakes up in the morning and feels he must clear his throat. Recently sensation is increasing.  No fevers or chills at home. No sick contacts. Throat is described. No coughing or sneezing. No trouble swallowing.  Pt reports no history of seasonal allergies.  Pt has history of heartburn, but is not taking any medication.  Home treatment tried: None.  Past Medical History  Diagnosis Date  . Hypertension   . Diabetes mellitus without complication ([place])   . Hyperlipidemia   . Arthritis   . GERD (gastroesophageal reflux disease)   . Dystonia   . ED (erectile dysfunction)   . BPH (benign prostatic hyperplasia)   . Hematuria   . Chronic kidney disease     kidney stones  . Glaucoma (increased eye pressure)     Current Outpatient Prescriptions on File Prior to Visit  Medication Sig  . aspirin 81 MG tablet Take 1 tablet (81 mg total) by mouth daily.  . benazepril-hydrochlorthiazide (LOTENSIN HCT) 10-12.5 MG per tablet Take 1 tablet by mouth daily.  . bimatoprost (LUMIGAN) 0.01 % SOLN Place 1 drop into both eyes at bedtime.  Marland Kitchen glipiZIDE (GLUCOTROL XL) 2.5 MG 24 hr tablet Take 1 tablet (2.5 mg total) by mouth daily with breakfast.  . lovastatin (MEVACOR) 20 MG tablet Take 1 tablet (20 mg total) by mouth at bedtime.  . metoprolol tartrate (LOPRESSOR) 25 MG tablet Take 0.5 tablets (12.5 mg total) by mouth 2 (two) times daily.   No current facility-administered medications on file prior to visit.    Review of Systems  Constitutional: Negative for fever and chills.  HENT: Positive for sore throat. Negative for congestion, ear pain, postnasal drip, rhinorrhea, sneezing, trouble  swallowing and voice change.   Respiratory: Negative for cough, chest tightness, shortness of breath and wheezing.   Cardiovascular: Negative for chest pain, palpitations and leg swelling.  Skin: Negative.   Allergic/Immunologic: Negative for environmental allergies.  Neurological: Negative for headaches.   Per HPI unless specifically indicated above     Objective:    BP 124/81 mmHg  Pulse 54  Temp(Src) 98.2 F (36.8 C) (Oral)  Resp 16  Ht 5\' 6"  (1.676 m)  Wt 158 lb (71.668 kg)  BMI 25.51 kg/m2  Wt Readings from Last 3 Encounters:  01/12/15 158 lb (71.668 kg)  12/27/14 157 lb 6.4 oz (71.396 kg)  12/06/14 160 lb (72.576 kg)    Physical Exam  Constitutional: He appears well-developed and well-nourished. No distress.  HENT:  Head: Normocephalic and atraumatic.  Right Ear: Hearing and tympanic membrane normal.  Left Ear: Hearing normal.  Nose: No mucosal edema or rhinorrhea.  Mouth/Throat: Uvula is midline and mucous membranes are normal. Posterior oropharyngeal erythema present. No oropharyngeal exudate, posterior oropharyngeal edema or tonsillar abscesses.  Cardiovascular: Normal rate and regular rhythm.  Exam reveals no gallop and no friction rub.   No murmur heard. Pulmonary/Chest: Effort normal and breath sounds normal. No respiratory distress. He has no wheezes.  Skin: He is not diaphoretic.   Results for orders placed or performed in visit on 12/27/14  BLADDER SCAN AMB NON-IMAGING  Result Value Ref  Range   Scan Result 51 mL       Assessment & Plan:   Problem List Items Addressed This Visit      Digestive   Acid reflux    Restart omeprazole for throat irritation. Assume silent GERD. No alarm symptoms. No hematemesis or melena.       Relevant Medications   omeprazole (PRILOSEC) 20 MG capsule    Other Visit Diagnoses    Throat irritation    -  Primary    GERD vs Allergies. Treat gerd symptoms, trial of antihistamine. Consider ENT referral if symptoms not  improving.  RTC 2 weeks     Relevant Medications    omeprazole (PRILOSEC) 20 MG capsule    fluticasone (FLONASE) 50 MCG/ACT nasal spray    loratadine (CLARITIN) 10 MG tablet       Meds ordered this encounter  Medications  . omeprazole (PRILOSEC) 20 MG capsule    Sig: Take 1 capsule (20 mg total) by mouth daily.    Dispense:  30 capsule    Refill:  11    Order Specific Question:  Supervising Provider    Answer:  Arlis Porta [phone]  . fluticasone (FLONASE) 50 MCG/ACT nasal spray    Sig: Place 2 sprays into both nostrils daily.    Dispense:  16 g    Refill:  3    Order Specific Question:  Supervising Provider    Answer:  Arlis Porta [id number]  . loratadine (CLARITIN) 10 MG tablet    Sig: Take 1 tablet (10 mg total) by mouth daily.    Order Specific Question:  Supervising Provider    Answer:  Arlis Porta [id number]      Follow up plan: Return in about 2 weeks (around 01/26/2015).

## 2015-01-12 NOTE — Patient Instructions (Signed)
I think your symptoms are coming from acid reflux or allergies. We will try a few medications to see if this helps your symptoms. If you are not improving in 2 weeks, please let me know. We will have you see a ENT specialist.   If you develop trouble swallowing, feel like your throat is closing, fever, or other concerning symptoms, please seek medical attention.

## 2015-01-12 NOTE — Assessment & Plan Note (Signed)
Restart omeprazole for throat irritation. Assume silent GERD. No alarm symptoms. No hematemesis or melena.

## 2015-01-16 DIAGNOSIS — H401132 Primary open-angle glaucoma, bilateral, moderate stage: Secondary | ICD-10-CM | POA: Diagnosis not present

## 2015-01-24 ENCOUNTER — Encounter: Payer: Self-pay | Admitting: Family Medicine

## 2015-01-24 ENCOUNTER — Ambulatory Visit (INDEPENDENT_AMBULATORY_CARE_PROVIDER_SITE_OTHER): Payer: Commercial Managed Care - HMO | Admitting: Family Medicine

## 2015-01-24 ENCOUNTER — Telehealth: Payer: Self-pay | Admitting: Family Medicine

## 2015-01-24 VITALS — BP 134/83 | HR 56 | Temp 98.0°F | Resp 16 | Ht 66.0 in | Wt 159.8 lb

## 2015-01-24 DIAGNOSIS — K219 Gastro-esophageal reflux disease without esophagitis: Secondary | ICD-10-CM | POA: Diagnosis not present

## 2015-01-24 DIAGNOSIS — J302 Other seasonal allergic rhinitis: Secondary | ICD-10-CM | POA: Diagnosis not present

## 2015-01-24 MED ORDER — RANITIDINE HCL 150 MG PO CAPS: 150.0000 mg | ORAL_CAPSULE | Freq: Two times a day (BID) | ORAL | Status: DC

## 2015-01-24 NOTE — Assessment & Plan Note (Signed)
Omeprazole has improved symptoms. Added zantac for bedtime since patient is leaving the country for 6 mos.  Pt has family MD in [place]- encouraged to see her if symptoms do not improve. Consider ENT referral if not improved when he returns to the [place].

## 2015-01-24 NOTE — Assessment & Plan Note (Signed)
Continue Claritin and Flonase. Consider ENT referral if symptoms not improved in April.

## 2015-01-24 NOTE — Telephone Encounter (Signed)
Pt daughter called had  Question about  Pt meds   Marla call back # is  [phone]

## 2015-01-24 NOTE — Progress Notes (Signed)
Subjective:    Patient ID: Ross Riley, male    DOB: 1950/10/06, 64 y.o.   MRN: [id number]  HPI: Ross Riley is a 64 y.o. male presenting on 01/24/2015 for Gastroesophageal Reflux and Allergic Rhinitis    HPI  Pt presents to follow-up on throat irritation thought to be GERD vs allergies.  Seen 2 weeks ago. Symptoms have gotten better but are still present. Taking omeprazole daily to help with acid reflux. Still having sensation that something is in the back of throat. No trouble swallowing, no coughing up blood. Tends to be worse with certain drinks- especially juice.   Pt is leaving the country to go home to [place] on Tuesday until April.    Past Medical History  Diagnosis Date  . Hypertension   . Diabetes mellitus without complication ([place])   . Hyperlipidemia   . Arthritis   . GERD (gastroesophageal reflux disease)   . Dystonia   . ED (erectile dysfunction)   . BPH (benign prostatic hyperplasia)   . Hematuria   . Chronic kidney disease     kidney stones  . Glaucoma (increased eye pressure)     Current Outpatient Prescriptions on File Prior to Visit  Medication Sig  . aspirin 81 MG tablet Take 1 tablet (81 mg total) by mouth daily.  . benazepril-hydrochlorthiazide (LOTENSIN HCT) 10-12.5 MG per tablet Take 1 tablet by mouth daily.  . bimatoprost (LUMIGAN) 0.01 % SOLN Place 1 drop into both eyes at bedtime.  . fluticasone (FLONASE) 50 MCG/ACT nasal spray Place 2 sprays into both nostrils daily.  Marland Kitchen glipiZIDE (GLUCOTROL XL) 2.5 MG 24 hr tablet Take 1 tablet (2.5 mg total) by mouth daily with breakfast.  . loratadine (CLARITIN) 10 MG tablet Take 1 tablet (10 mg total) by mouth daily.  Marland Kitchen lovastatin (MEVACOR) 20 MG tablet Take 1 tablet (20 mg total) by mouth at bedtime.  . metoprolol tartrate (LOPRESSOR) 25 MG tablet Take 0.5 tablets (12.5 mg total) by mouth 2 (two) times daily.  Marland Kitchen omeprazole (PRILOSEC) 20 MG capsule TAKE 1 CAPSULE(20 MG) BY MOUTH DAILY   No  current facility-administered medications on file prior to visit.    Review of Systems  Constitutional: Negative for fever and chills.  HENT: Positive for postnasal drip and sore throat (throat irritation). Negative for trouble swallowing and voice change.   Respiratory: Negative for chest tightness, shortness of breath and wheezing.   Cardiovascular: Negative for chest pain, palpitations and leg swelling.  Gastrointestinal: Negative for nausea, vomiting and abdominal pain.  Endocrine: Negative.   Genitourinary: Negative for dysuria, urgency, discharge, penile pain and testicular pain.  Musculoskeletal: Negative for back pain, joint swelling and arthralgias.  Skin: Negative.   Neurological: Negative for dizziness, weakness, numbness and headaches.  Psychiatric/Behavioral: Negative for sleep disturbance and dysphoric mood.   Per HPI unless specifically indicated above     Objective:    BP 134/83 mmHg  Pulse 56  Temp(Src) 98 F (36.7 C) (Oral)  Resp 16  Ht 5\' 6"  (1.676 m)  Wt 159 lb 12.8 oz (72.485 kg)  BMI 25.80 kg/m2  Wt Readings from Last 3 Encounters:  01/24/15 159 lb 12.8 oz (72.485 kg)  01/12/15 158 lb (71.668 kg)  12/27/14 157 lb 6.4 oz (71.396 kg)    Physical Exam  Constitutional: He is oriented to person, place, and time. He appears well-developed and well-nourished. No distress.  HENT:  Head: Normocephalic and atraumatic.  Right Ear: Hearing and tympanic membrane normal.  Left Ear: Hearing and tympanic membrane normal.  Mouth/Throat: Oropharynx is clear and moist and mucous membranes are normal. No posterior oropharyngeal edema or posterior oropharyngeal erythema.  Neck: Neck supple. No thyromegaly present.  Cardiovascular: Normal rate, regular rhythm and normal heart sounds.  Exam reveals no gallop and no friction rub.   No murmur heard. Pulmonary/Chest: Effort normal and breath sounds normal. He has no wheezes.  Abdominal: Soft. Bowel sounds are normal. He  exhibits no distension. There is no tenderness. There is no rebound.  Musculoskeletal: Normal range of motion. He exhibits no edema or tenderness.  Neurological: He is alert and oriented to person, place, and time. He has normal reflexes.  Skin: Skin is warm and dry. No rash noted. No erythema.  Psychiatric: He has a normal mood and affect. His behavior is normal. Thought content normal.   Results for orders placed or performed in visit on 12/27/14  BLADDER SCAN AMB NON-IMAGING  Result Value Ref Range   Scan Result 51 mL       Assessment & Plan:   Problem List Items Addressed This Visit    None      No orders of the defined types were placed in this encounter.      Follow up plan: No Follow-up on file.

## 2015-01-24 NOTE — Patient Instructions (Signed)
I think your symptoms could be acid reflux- avoid any juice that makes it worse. Start taking Ranitidine 150mg  twice daily (before breakfast and before bed).  Continue to take Loratidine or Claritin OTC to help with allergies and continue nasal spray in the morning. If symptoms don't improve we will have you see an Ear, Nose, Throat specialist when you return to the [place].

## 2015-01-24 NOTE — Telephone Encounter (Signed)
Explained that Ranitadine was added. Patient is to take both meds as prescribed.[place]

## 2015-01-29 DIAGNOSIS — H401132 Primary open-angle glaucoma, bilateral, moderate stage: Secondary | ICD-10-CM | POA: Diagnosis not present

## 2015-05-01 ENCOUNTER — Ambulatory Visit: Payer: Commercial Managed Care - HMO | Admitting: Family Medicine

## 2015-05-04 ENCOUNTER — Telehealth: Payer: Self-pay | Admitting: Family Medicine

## 2015-05-04 NOTE — Telephone Encounter (Signed)
Approval is done # [id number] and through 06/19/2015 till 12/16/2015 with 6 visit.

## 2015-05-04 NOTE — Telephone Encounter (Signed)
Olivia Mackie from [facility] [phone], states that pt need [facility] Authorization    Appt: April  18 @2 :15 DX N40.0 Dr. Hollice Espy NPI: [id number]

## 2015-06-15 ENCOUNTER — Ambulatory Visit: Payer: Commercial Managed Care - HMO | Admitting: Urology

## 2015-06-18 ENCOUNTER — Encounter: Payer: Self-pay | Admitting: Family Medicine

## 2015-06-18 ENCOUNTER — Ambulatory Visit: Payer: Commercial Managed Care - HMO | Admitting: Family Medicine

## 2015-06-18 ENCOUNTER — Ambulatory Visit (INDEPENDENT_AMBULATORY_CARE_PROVIDER_SITE_OTHER): Payer: Commercial Managed Care - HMO | Admitting: Family Medicine

## 2015-06-18 VITALS — BP 120/80 | HR 52 | Temp 98.6°F | Resp 16 | Ht 66.0 in | Wt 156.0 lb

## 2015-06-18 DIAGNOSIS — N182 Chronic kidney disease, stage 2 (mild): Secondary | ICD-10-CM | POA: Diagnosis not present

## 2015-06-18 DIAGNOSIS — I129 Hypertensive chronic kidney disease with stage 1 through stage 4 chronic kidney disease, or unspecified chronic kidney disease: Secondary | ICD-10-CM | POA: Diagnosis not present

## 2015-06-18 DIAGNOSIS — J302 Other seasonal allergic rhinitis: Secondary | ICD-10-CM | POA: Diagnosis not present

## 2015-06-18 DIAGNOSIS — K219 Gastro-esophageal reflux disease without esophagitis: Secondary | ICD-10-CM | POA: Diagnosis not present

## 2015-06-18 DIAGNOSIS — E785 Hyperlipidemia, unspecified: Secondary | ICD-10-CM

## 2015-06-18 DIAGNOSIS — E1136 Type 2 diabetes mellitus with diabetic cataract: Secondary | ICD-10-CM

## 2015-06-18 LAB — POCT GLYCOSYLATED HEMOGLOBIN (HGB A1C): HEMOGLOBIN A1C: 6.1

## 2015-06-18 LAB — POCT UA - MICROALBUMIN
ALBUMIN/CREATININE RATIO, URINE, POC: 0
CREATININE, POC: 0 mg/dL
MICROALBUMIN (UR) POC: 20 mg/L

## 2015-06-18 MED ORDER — GLIPIZIDE ER 2.5 MG PO TB24: 2.5000 mg | ORAL_TABLET | Freq: Every day | ORAL | Status: DC

## 2015-06-18 NOTE — Assessment & Plan Note (Addendum)
Controlled. Continue current regimen. Eye exam will be updated this week. ACE for renal protection. Foot exam done.  UA microalbumin is negative.  Recheck 6 mos.

## 2015-06-18 NOTE — Progress Notes (Signed)
Subjective:    Patient ID: Ross Riley, male    DOB: [birth year], 65 y.o.   MRN: [id number]  HPI: Ross Riley is a 65 y.o. male presenting on 06/18/2015 for Diabetes   HPI  Pt presents for diabetes follow-up. Recently returned from extended stay in [place]. Not checking sugars while out of the country. Some numbness and tingling in feet. Saw eye Doctor in October- has appt this week- will get eye exam at that point.  Feels dizzy when he gets out of bed in the morning. Does not check BP at home. No chest pain, no SOB.  No visual changes. Taking BP medications as directed. Not drinking a lot of water.  Some coughing and nagging throat dryness after returning to [place]. Has not been taking claritin or flonase while travelling. Is continued acid reflux medications no changes in symptoms.   Past Medical History  Diagnosis Date  . Hypertension   . Diabetes mellitus without complication ([place])   . Hyperlipidemia   . Arthritis   . GERD (gastroesophageal reflux disease)   . Dystonia   . ED (erectile dysfunction)   . BPH (benign prostatic hyperplasia)   . Hematuria   . Chronic kidney disease     kidney stones  . Glaucoma (increased eye pressure)     Current Outpatient Prescriptions on File Prior to Visit  Medication Sig  . aspirin 81 MG tablet Take 1 tablet (81 mg total) by mouth daily.  . benazepril-hydrochlorthiazide (LOTENSIN HCT) 10-12.5 MG per tablet Take 1 tablet by mouth daily.  . bimatoprost (LUMIGAN) 0.01 % SOLN Place 1 drop into both eyes at bedtime.  . fluticasone (FLONASE) 50 MCG/ACT nasal spray Place 2 sprays into both nostrils daily.  Marland Kitchen loratadine (CLARITIN) 10 MG tablet Take 1 tablet (10 mg total) by mouth daily.  Marland Kitchen lovastatin (MEVACOR) 20 MG tablet Take 1 tablet (20 mg total) by mouth at bedtime.  . metoprolol tartrate (LOPRESSOR) 25 MG tablet Take 0.5 tablets (12.5 mg total) by mouth 2 (two) times daily.  Marland Kitchen omeprazole (PRILOSEC) 20 MG capsule TAKE 1 CAPSULE(20 MG)  BY MOUTH DAILY  . ranitidine (ZANTAC) 150 MG capsule Take 1 capsule (150 mg total) by mouth 2 (two) times daily.   No current facility-administered medications on file prior to visit.    Review of Systems  Constitutional: Negative for fever and chills.  HENT: Positive for postnasal drip and rhinorrhea.   Respiratory: Positive for cough. Negative for chest tightness, shortness of breath and wheezing.   Cardiovascular: Negative for chest pain, palpitations and leg swelling.  Gastrointestinal: Negative for nausea, vomiting and abdominal pain.  Endocrine: Negative.  Negative for polydipsia, polyphagia and polyuria.  Genitourinary: Negative for dysuria, urgency, discharge, penile pain and testicular pain.  Musculoskeletal: Negative for back pain, joint swelling and arthralgias.  Skin: Negative.   Neurological: Positive for dizziness. Negative for weakness, numbness and headaches.  Psychiatric/Behavioral: Negative for sleep disturbance and dysphoric mood.   Per HPI unless specifically indicated above     Objective:    BP 120/80 mmHg  Pulse 52  Temp(Src) 98.6 F (37 C) (Oral)  Resp 16  Ht 5\' 6"  (1.676 m)  Wt 156 lb (70.761 kg)  BMI 25.19 kg/m2  Wt Readings from Last 3 Encounters:  06/18/15 156 lb (70.761 kg)  01/24/15 159 lb 12.8 oz (72.485 kg)  01/12/15 158 lb (71.668 kg)    Physical Exam  Constitutional: He is oriented to person, place, and time. He  appears well-developed and well-nourished. No distress.  HENT:  Head: Normocephalic and atraumatic.  Neck: Neck supple. No thyromegaly present.  Cardiovascular: Normal rate, regular rhythm and normal heart sounds.  Exam reveals no gallop and no friction rub.   No murmur heard. Pulmonary/Chest: Effort normal and breath sounds normal. He has no wheezes.  Abdominal: Soft. Bowel sounds are normal. He exhibits no distension. There is no tenderness. There is no rebound.  Musculoskeletal: Normal range of motion. He exhibits no edema or  tenderness.  Neurological: He is alert and oriented to person, place, and time. He has normal reflexes.  Skin: Skin is warm and dry. No rash noted. No erythema.  Psychiatric: He has a normal mood and affect. His behavior is normal. Thought content normal.   Diabetic Foot Exam - Simple   Simple Foot Form  Diabetic Foot exam was performed with the following findings:  Yes 06/18/2015 11:09 AM  Visual Inspection  No deformities, no ulcerations, no other skin breakdown bilaterally:  Yes  Sensation Testing  Intact to touch and monofilament testing bilaterally:  Yes  Pulse Check  Posterior Tibialis and Dorsalis pulse intact bilaterally:  Yes  Comments       Results for orders placed or performed in visit on 06/18/15  POCT HgB A1C  Result Value Ref Range   Hemoglobin A1C 6.1   POCT UA - Microalbumin  Result Value Ref Range   Microalbumin Ur, POC 20 mg/L   Creatinine, POC 0 mg/dL   Albumin/Creatinine Ratio, Urine, POC 0       Assessment & Plan:   Problem List Items Addressed This Visit      Cardiovascular and Mediastinum   Benign hypertension with chronic kidney disease, stage II    BP controlled. Considered about orthostatic hypotensive in the AM- encouraged changing position slowly. Consider decreasing medication if dizziness continues. ACE for renal protection. CMET. Recheck 1 mos.       Relevant Orders   Comprehensive metabolic panel     Respiratory   Seasonal allergies    Encouraged pt to take daily flonase and claritin to help with nagging cough since returning to [place].         Digestive   Acid reflux    Continue current regimen. Consider GI referral of throat irritation continues.         Endocrine   Diabetes ([place]) - Primary    Controlled. Continue current regimen. Eye exam will be updated this week. ACE for renal protection. Foot exam done.  UA microalbumin is negative.  Recheck 6 mos.       Relevant Medications   glipiZIDE (GLUCOTROL XL) 2.5 MG 24 hr  tablet   Other Relevant Orders   POCT HgB A1C (Completed)   POCT UA - Microalbumin (Completed)   Comprehensive metabolic panel   Lipid panel     Other   Hyperlipidemia      Meds ordered this encounter  Medications  . timolol (TIMOPTIC) 0.25 % ophthalmic solution    Sig: INT 1 GTT IN OU QAM    Refill:  2  . glipiZIDE (GLUCOTROL XL) 2.5 MG 24 hr tablet    Sig: Take 1 tablet (2.5 mg total) by mouth daily with breakfast.    Dispense:  90 tablet    Refill:  3    **Patient requests 90 days supply**    Order Specific Question:  Supervising Provider    Answer:  Arlis Porta [phone]      Follow  up plan: Return in about 4 weeks (around 07/16/2015) for HTN.

## 2015-06-18 NOTE — Assessment & Plan Note (Signed)
BP controlled. Considered about orthostatic hypotensive in the AM- encouraged changing position slowly. Consider decreasing medication if dizziness continues. ACE for renal protection. CMET. Recheck 1 mos.

## 2015-06-18 NOTE — Assessment & Plan Note (Signed)
Encouraged pt to take daily flonase and claritin to help with nagging cough since returning to [place].

## 2015-06-18 NOTE — Patient Instructions (Signed)
Your goal blood pressure is 140/90. Work on low salt/sodium diet - goal <1.5gm (1,500mg ) per day. Eat a diet high in fruits/vegetables and whole grains.  Look into mediterranean and DASH diet. Goal activity is 150min/wk of moderate intensity exercise.  This can be split into 30 minute chunks.  If you are not at this level, you can start with smaller 10-15 min increments and slowly build up activity. Look at [email or web address] for more resources  Stand up slowly in the morning. IF dizziness, continues we may change your blood pressure medication.   Please seek immediate medical attention at ER or Urgent Care if you develop: Chest pain, pressure or tightness. Shortness of breath accompanied by nausea or diaphoresis Visual changes Numbness or tingling on one side of the body Facial droop Altered mental status Or any concerning symptoms.

## 2015-06-18 NOTE — Assessment & Plan Note (Signed)
Continue current regimen. Consider GI referral of throat irritation continues.

## 2015-06-19 ENCOUNTER — Ambulatory Visit: Payer: Commercial Managed Care - HMO | Admitting: Urology

## 2015-06-19 DIAGNOSIS — N182 Chronic kidney disease, stage 2 (mild): Secondary | ICD-10-CM | POA: Diagnosis not present

## 2015-06-19 DIAGNOSIS — E1136 Type 2 diabetes mellitus with diabetic cataract: Secondary | ICD-10-CM | POA: Diagnosis not present

## 2015-06-19 DIAGNOSIS — I129 Hypertensive chronic kidney disease with stage 1 through stage 4 chronic kidney disease, or unspecified chronic kidney disease: Secondary | ICD-10-CM | POA: Diagnosis not present

## 2015-06-20 ENCOUNTER — Other Ambulatory Visit: Payer: Self-pay | Admitting: Family Medicine

## 2015-06-20 DIAGNOSIS — E785 Hyperlipidemia, unspecified: Secondary | ICD-10-CM

## 2015-06-20 LAB — COMPREHENSIVE METABOLIC PANEL
A/G RATIO: 1.4 (ref 1.2–2.2)
ALT: 20 IU/L (ref 0–44)
AST: 20 IU/L (ref 0–40)
Albumin: 4.3 g/dL (ref 3.6–4.8)
Alkaline Phosphatase: 58 IU/L (ref 39–117)
BUN/Creatinine Ratio: 16 (ref 10–24)
BUN: 13 mg/dL (ref 8–27)
Bilirubin Total: 0.4 mg/dL (ref 0.0–1.2)
CALCIUM: 8.9 mg/dL (ref 8.6–10.2)
CO2: 24 mmol/L (ref 18–29)
Chloride: 99 mmol/L (ref 96–106)
Creatinine, Ser: 0.82 mg/dL (ref 0.76–1.27)
GFR, EST AFRICAN AMERICAN: 108 mL/min/{1.73_m2} (ref >59)
GFR, EST NON AFRICAN AMERICAN: 93 mL/min/{1.73_m2} (ref >59)
GLOBULIN, TOTAL: 3 g/dL (ref 1.5–4.5)
Glucose: 89 mg/dL (ref 65–99)
POTASSIUM: 4.2 mmol/L (ref 3.5–5.2)
SODIUM: 142 mmol/L (ref 134–144)
TOTAL PROTEIN: 7.3 g/dL (ref 6.0–8.5)

## 2015-06-20 LAB — LIPID PANEL
CHOL/HDL RATIO: 3.8 ratio (ref 0.0–5.0)
Cholesterol, Total: 195 mg/dL (ref 100–199)
HDL: 52 mg/dL (ref >39)
LDL Calculated: 118 mg/dL — ABNORMAL HIGH (ref 0–99)
Triglycerides: 127 mg/dL (ref 0–149)
VLDL Cholesterol Cal: 25 mg/dL (ref 5–40)

## 2015-06-20 MED ORDER — ATORVASTATIN CALCIUM 20 MG PO TABS: 20.0000 mg | ORAL_TABLET | Freq: Every day | ORAL | Status: DC

## 2015-07-12 ENCOUNTER — Telehealth: Payer: Self-pay | Admitting: Family Medicine

## 2015-07-12 NOTE — Telephone Encounter (Signed)
[employer]  [id number]  Ross Riley   DX: M17.0  NPI [id number]  Dr: Kaylyn Layer  Appt: May 15 @ 10:00

## 2015-07-13 NOTE — Telephone Encounter (Signed)
Referral # [id number] approval date 07/13/2015 to 01/09/2016.

## 2015-07-18 ENCOUNTER — Encounter: Payer: Self-pay | Admitting: Family Medicine

## 2015-07-18 ENCOUNTER — Ambulatory Visit (INDEPENDENT_AMBULATORY_CARE_PROVIDER_SITE_OTHER): Payer: Commercial Managed Care - HMO | Admitting: Family Medicine

## 2015-07-18 VITALS — BP 120/73 | HR 55 | Temp 98.6°F | Resp 16 | Ht 66.0 in | Wt 159.0 lb

## 2015-07-18 DIAGNOSIS — I129 Hypertensive chronic kidney disease with stage 1 through stage 4 chronic kidney disease, or unspecified chronic kidney disease: Secondary | ICD-10-CM

## 2015-07-18 DIAGNOSIS — E785 Hyperlipidemia, unspecified: Secondary | ICD-10-CM

## 2015-07-18 DIAGNOSIS — N182 Chronic kidney disease, stage 2 (mild): Secondary | ICD-10-CM

## 2015-07-18 NOTE — Progress Notes (Signed)
Subjective:    Patient ID: Ross Riley, male    DOB: November 20, 1950, 65 y.o.   MRN: [id number]  HPI: Ross Riley is a 65 y.o. male presenting on 07/18/2015 for Hypertension   HPI  Pt presents for follow-up of blood pressure. Was dizzy last visit. Dizziness has resolved. No dizziness with position changes. BP's avg 120-130/70 at home. No HA or visual changes. Feeling well at home.  Changed to atorvastatin due to high cholesterol.   On Monday- will see orthopedist for knee issue. Is wearing a brace. Was getting injections for osteoarthritis.   Past Medical History  Diagnosis Date  . Hypertension   . Diabetes mellitus without complication ([place])   . Hyperlipidemia   . Arthritis   . GERD (gastroesophageal reflux disease)   . Dystonia   . ED (erectile dysfunction)   . BPH (benign prostatic hyperplasia)   . Hematuria   . Chronic kidney disease     kidney stones  . Glaucoma (increased eye pressure)     Current Outpatient Prescriptions on File Prior to Visit  Medication Sig  . aspirin 81 MG tablet Take 1 tablet (81 mg total) by mouth daily.  Marland Kitchen atorvastatin (LIPITOR) 20 MG tablet Take 1 tablet (20 mg total) by mouth daily.  . benazepril-hydrochlorthiazide (LOTENSIN HCT) 10-12.5 MG per tablet Take 1 tablet by mouth daily.  . bimatoprost (LUMIGAN) 0.01 % SOLN Place 1 drop into both eyes at bedtime.  . fluticasone (FLONASE) 50 MCG/ACT nasal spray Place 2 sprays into both nostrils daily.  Marland Kitchen glipiZIDE (GLUCOTROL XL) 2.5 MG 24 hr tablet Take 1 tablet (2.5 mg total) by mouth daily with breakfast.  . loratadine (CLARITIN) 10 MG tablet Take 1 tablet (10 mg total) by mouth daily.  . metoprolol tartrate (LOPRESSOR) 25 MG tablet Take 0.5 tablets (12.5 mg total) by mouth 2 (two) times daily.  Marland Kitchen omeprazole (PRILOSEC) 20 MG capsule TAKE 1 CAPSULE(20 MG) BY MOUTH DAILY  . ranitidine (ZANTAC) 150 MG capsule Take 1 capsule (150 mg total) by mouth 2 (two) times daily.  . timolol (TIMOPTIC) 0.25 %  ophthalmic solution INT 1 GTT IN OU QAM   No current facility-administered medications on file prior to visit.    Review of Systems  Constitutional: Negative for fever and chills.  HENT: Negative.   Respiratory: Negative for chest tightness, shortness of breath and wheezing.   Cardiovascular: Negative for chest pain, palpitations and leg swelling.  Gastrointestinal: Negative for nausea, vomiting and abdominal pain.  Endocrine: Negative.   Genitourinary: Negative for dysuria, urgency, discharge, penile pain and testicular pain.  Musculoskeletal: Negative for back pain, joint swelling and arthralgias.  Skin: Negative.   Neurological: Negative for dizziness, weakness, numbness and headaches.  Psychiatric/Behavioral: Negative for sleep disturbance and dysphoric mood.   Per HPI unless specifically indicated above     Objective:    BP 120/73 mmHg  Pulse 55  Temp(Src) 98.6 F (37 C) (Oral)  Resp 16  Ht 5\' 6"  (1.676 m)  Wt 159 lb (72.122 kg)  BMI 25.68 kg/m2  Wt Readings from Last 3 Encounters:  07/18/15 159 lb (72.122 kg)  06/18/15 156 lb (70.761 kg)  01/24/15 159 lb 12.8 oz (72.485 kg)    Physical Exam  Constitutional: He is oriented to person, place, and time. He appears well-developed and well-nourished. No distress.  HENT:  Head: Normocephalic and atraumatic.  Neck: Neck supple. No thyromegaly present.  Cardiovascular: Normal rate, regular rhythm and normal heart sounds.  Exam  reveals no gallop and no friction rub.   No murmur heard. Pulmonary/Chest: Effort normal and breath sounds normal. He has no wheezes.  Abdominal: Soft. Bowel sounds are normal. He exhibits no distension. There is no tenderness. There is no rebound.  Musculoskeletal: Normal range of motion. He exhibits no edema or tenderness.  Neurological: He is alert and oriented to person, place, and time. He has normal reflexes.  Skin: Skin is warm and dry. No rash noted. No erythema.  Psychiatric: He has a  normal mood and affect. His behavior is normal. Thought content normal.   Results for orders placed or performed in visit on 06/18/15  Comprehensive metabolic panel  Result Value Ref Range   Glucose 89 65 - 99 mg/dL   BUN 13 8 - 27 mg/dL   Creatinine, Ser 0.82 0.76 - 1.27 mg/dL   GFR calc non Af Amer 93 >59 mL/min/1.73   GFR calc Af Amer 108 >59 mL/min/1.73   BUN/Creatinine Ratio 16 10 - 24   Sodium 142 134 - 144 mmol/L   Potassium 4.2 3.5 - 5.2 mmol/L   Chloride 99 96 - 106 mmol/L   CO2 24 18 - 29 mmol/L   Calcium 8.9 8.6 - 10.2 mg/dL   Total Protein 7.3 6.0 - 8.5 g/dL   Albumin 4.3 3.6 - 4.8 g/dL   Globulin, Total 3.0 1.5 - 4.5 g/dL   Albumin/Globulin Ratio 1.4 1.2 - 2.2   Bilirubin Total 0.4 0.0 - 1.2 mg/dL   Alkaline Phosphatase 58 39 - 117 IU/L   AST 20 0 - 40 IU/L   ALT 20 0 - 44 IU/L  Lipid panel  Result Value Ref Range   Cholesterol, Total 195 100 - 199 mg/dL   Triglycerides 127 0 - 149 mg/dL   HDL 52 >39 mg/dL   VLDL Cholesterol Cal 25 5 - 40 mg/dL   LDL Calculated 118 (H) 0 - 99 mg/dL   Chol/HDL Ratio 3.8 0.0 - 5.0 ratio units  POCT HgB A1C  Result Value Ref Range   Hemoglobin A1C 6.1   POCT UA - Microalbumin  Result Value Ref Range   Microalbumin Ur, POC 20 mg/L   Creatinine, POC 0 mg/dL   Albumin/Creatinine Ratio, Urine, POC 0       Assessment & Plan:   Problem List Items Addressed This Visit      Cardiovascular and Mediastinum   Benign hypertension with chronic kidney disease, stage II - Primary    BP controlled and pt no longer dizzy. No changes to regimen today. Will continue to monitor kidney function. Recheck again in 5 mos for normal 6 mos follow-up.         Other   Hyperlipidemia    Changed statin to atorvastatin as last visit due to increase cholesterol. Will recheck lipid profile in 5 mos at 6 mos follow-up appt.           No orders of the defined types were placed in this encounter.      Follow up plan: Return in about 5 months  (around 12/18/2015) for Diabetes. Marland Kitchen

## 2015-07-18 NOTE — Assessment & Plan Note (Signed)
BP controlled and pt no longer dizzy. No changes to regimen today. Will continue to monitor kidney function. Recheck again in 5 mos for normal 6 mos follow-up.

## 2015-07-18 NOTE — Patient Instructions (Signed)
Your goal blood pressure is 100/60 to 140/90. Please call me if you are dizzy.  Work on low salt/sodium diet - goal <1.5gm (1,500mg ) per day. Eat a diet high in fruits/vegetables and whole grains.  Look into mediterranean and DASH diet. Goal activity is 124min/wk of moderate intensity exercise.  This can be split into 30 minute chunks.  If you are not at this level, you can start with smaller 10-15 min increments and slowly build up activity. Look at [place].org for more resources

## 2015-07-18 NOTE — Assessment & Plan Note (Signed)
Changed statin to atorvastatin as last visit due to increase cholesterol. Will recheck lipid profile in 5 mos at 6 mos follow-up appt.

## 2015-07-23 DIAGNOSIS — R262 Difficulty in walking, not elsewhere classified: Secondary | ICD-10-CM | POA: Diagnosis not present

## 2015-07-23 DIAGNOSIS — M1712 Unilateral primary osteoarthritis, left knee: Secondary | ICD-10-CM | POA: Diagnosis not present

## 2015-07-23 DIAGNOSIS — M25561 Pain in right knee: Secondary | ICD-10-CM | POA: Diagnosis not present

## 2015-07-23 DIAGNOSIS — M25562 Pain in left knee: Secondary | ICD-10-CM | POA: Diagnosis not present

## 2015-07-23 DIAGNOSIS — R2689 Other abnormalities of gait and mobility: Secondary | ICD-10-CM | POA: Diagnosis not present

## 2015-07-23 DIAGNOSIS — M170 Bilateral primary osteoarthritis of knee: Secondary | ICD-10-CM | POA: Diagnosis not present

## 2015-07-31 DIAGNOSIS — M25561 Pain in right knee: Secondary | ICD-10-CM | POA: Diagnosis not present

## 2015-07-31 DIAGNOSIS — M25562 Pain in left knee: Secondary | ICD-10-CM | POA: Diagnosis not present

## 2015-07-31 DIAGNOSIS — R2689 Other abnormalities of gait and mobility: Secondary | ICD-10-CM | POA: Diagnosis not present

## 2015-07-31 DIAGNOSIS — M1712 Unilateral primary osteoarthritis, left knee: Secondary | ICD-10-CM | POA: Diagnosis not present

## 2015-08-06 DIAGNOSIS — R2689 Other abnormalities of gait and mobility: Secondary | ICD-10-CM | POA: Diagnosis not present

## 2015-08-06 DIAGNOSIS — M170 Bilateral primary osteoarthritis of knee: Secondary | ICD-10-CM | POA: Diagnosis not present

## 2015-08-06 DIAGNOSIS — M1712 Unilateral primary osteoarthritis, left knee: Secondary | ICD-10-CM | POA: Diagnosis not present

## 2015-08-06 DIAGNOSIS — M25562 Pain in left knee: Secondary | ICD-10-CM | POA: Diagnosis not present

## 2015-08-06 DIAGNOSIS — M25561 Pain in right knee: Secondary | ICD-10-CM | POA: Diagnosis not present

## 2015-08-13 DIAGNOSIS — R2689 Other abnormalities of gait and mobility: Secondary | ICD-10-CM | POA: Diagnosis not present

## 2015-08-13 DIAGNOSIS — M1712 Unilateral primary osteoarthritis, left knee: Secondary | ICD-10-CM | POA: Diagnosis not present

## 2015-08-13 DIAGNOSIS — M170 Bilateral primary osteoarthritis of knee: Secondary | ICD-10-CM | POA: Diagnosis not present

## 2015-08-13 DIAGNOSIS — M25561 Pain in right knee: Secondary | ICD-10-CM | POA: Diagnosis not present

## 2015-08-13 DIAGNOSIS — M25562 Pain in left knee: Secondary | ICD-10-CM | POA: Diagnosis not present

## 2015-08-20 DIAGNOSIS — M25562 Pain in left knee: Secondary | ICD-10-CM | POA: Diagnosis not present

## 2015-08-20 DIAGNOSIS — R2689 Other abnormalities of gait and mobility: Secondary | ICD-10-CM | POA: Diagnosis not present

## 2015-08-20 DIAGNOSIS — M25561 Pain in right knee: Secondary | ICD-10-CM | POA: Diagnosis not present

## 2015-08-20 DIAGNOSIS — M1712 Unilateral primary osteoarthritis, left knee: Secondary | ICD-10-CM | POA: Diagnosis not present

## 2015-08-20 DIAGNOSIS — M170 Bilateral primary osteoarthritis of knee: Secondary | ICD-10-CM | POA: Diagnosis not present

## 2015-10-05 ENCOUNTER — Ambulatory Visit (INDEPENDENT_AMBULATORY_CARE_PROVIDER_SITE_OTHER): Payer: Commercial Managed Care - HMO | Admitting: Family Medicine

## 2015-10-05 VITALS — BP 138/84 | HR 81 | Temp 98.5°F | Resp 16 | Ht 66.0 in | Wt 160.0 lb

## 2015-10-05 DIAGNOSIS — E1136 Type 2 diabetes mellitus with diabetic cataract: Secondary | ICD-10-CM | POA: Diagnosis not present

## 2015-10-05 DIAGNOSIS — K219 Gastro-esophageal reflux disease without esophagitis: Secondary | ICD-10-CM | POA: Diagnosis not present

## 2015-10-05 LAB — POCT GLYCOSYLATED HEMOGLOBIN (HGB A1C): Hemoglobin A1C: 6.2

## 2015-10-05 LAB — COMPLETE METABOLIC PANEL WITH GFR
ALBUMIN: 4.3 g/dL (ref 3.6–5.1)
ALK PHOS: 53 U/L (ref 40–115)
ALT: 16 U/L (ref 9–46)
AST: 19 U/L (ref 10–35)
BUN: 8 mg/dL (ref 7–25)
CO2: 29 mmol/L (ref 20–31)
Calcium: 9.1 mg/dL (ref 8.6–10.3)
Chloride: 100 mmol/L (ref 98–110)
Creat: 0.97 mg/dL (ref 0.70–1.25)
GFR, EST NON AFRICAN AMERICAN: 82 mL/min (ref >=60)
GLUCOSE: 150 mg/dL — AB (ref 65–99)
POTASSIUM: 4.5 mmol/L (ref 3.5–5.3)
SODIUM: 139 mmol/L (ref 135–146)
Total Bilirubin: 0.6 mg/dL (ref 0.2–1.2)
Total Protein: 7.4 g/dL (ref 6.1–8.1)

## 2015-10-05 LAB — LIPID PANEL
CHOLESTEROL: 208 mg/dL — AB (ref 125–200)
HDL: 58 mg/dL (ref >=40)
LDL Cholesterol: 108 mg/dL (ref <130)
Total CHOL/HDL Ratio: 3.6 Ratio (ref <=5.0)
Triglycerides: 212 mg/dL — ABNORMAL HIGH (ref <150)
VLDL: 42 mg/dL — ABNORMAL HIGH (ref <30)

## 2015-10-05 MED ORDER — RANITIDINE HCL 150 MG PO CAPS: 150.0000 mg | ORAL_CAPSULE | Freq: Two times a day (BID) | ORAL | 3 refills | Status: DC

## 2015-10-05 MED ORDER — GLIPIZIDE ER 5 MG PO TB24: 5.0000 mg | ORAL_TABLET | Freq: Every day | ORAL | 3 refills | Status: DC

## 2015-10-05 MED ORDER — OMEPRAZOLE 20 MG PO CPDR: 20.0000 mg | DELAYED_RELEASE_CAPSULE | Freq: Every day | ORAL | 3 refills | Status: DC

## 2015-10-05 NOTE — Assessment & Plan Note (Addendum)
A1C is mildly elevated. Will return back to 5mg  glucotrol dosing. Pt advised to check sugars closely in [place]. If Sugars are low- take 1/2 tablet only. ACE for renal protection. UA micro albumin complete. Foot exam UTD.  Eye exam q6 mos 2/2 cataracts.  Check CMP.  Follow-up 6 mos or when pt returns from [place].

## 2015-10-05 NOTE — Patient Instructions (Signed)
I have increased your gluctrol to 5mg  once daily to help control your sugars a little better. If you feel your sugars are dropping too low, just take 1/2 tablet.  Please seek immediate medical attention at ER or Urgent Care if you develop: Chest pain, pressure or tightness. Shortness of breath accompanied by nausea or diaphoresis Visual changes Numbness or tingling on one side of the body Facial droop Altered mental status Or any concerning symptoms.  Your goal blood pressure is 140/90 Work on low salt/sodium diet - goal <1.5gm (1,500mg ) per day. Eat a diet high in fruits/vegetables and whole grains.  Look into mediterranean and DASH diet. Goal activity is 128min/wk of moderate intensity exercise.  This can be split into 30 minute chunks.  If you are not at this level, you can start with smaller 10-15 min increments and slowly build up activity. Look at [place].org for more resources

## 2015-10-05 NOTE — Assessment & Plan Note (Signed)
Renewed medications for extended travel.

## 2015-10-05 NOTE — Progress Notes (Signed)
Subjective:    Patient ID: Ross Riley, male    DOB: 01/19/1951, 65 y.o.   MRN: [id number]  HPI: Ross Riley is a 65 y.o. male presenting on 10/05/2015 for Diabetes   HPI  Pt presents for diabetes follow-up. He will be leaving the country next week. Will be gone for 8 mos.  A1c is 6.2% today- mildy elevated from baseline. His glipizide dose was reduced last visit. No blurred vision. Eye exam done. No numbness or tingling in his feet. BP mildly elevated today. No chest pain. No SOB. No dizziness.  GERD symptoms are well controlled.   Past Medical History:  Diagnosis Date  . Arthritis   . BPH (benign prostatic hyperplasia)   . Chronic kidney disease    kidney stones  . Diabetes mellitus without complication ([place])   . Dystonia   . ED (erectile dysfunction)   . GERD (gastroesophageal reflux disease)   . Glaucoma (increased eye pressure)   . Hematuria   . Hyperlipidemia   . Hypertension     Current Outpatient Prescriptions on File Prior to Visit  Medication Sig  . aspirin 81 MG tablet Take 1 tablet (81 mg total) by mouth daily.  Marland Kitchen atorvastatin (LIPITOR) 20 MG tablet Take 1 tablet (20 mg total) by mouth daily.  . benazepril-hydrochlorthiazide (LOTENSIN HCT) 10-12.5 MG per tablet Take 1 tablet by mouth daily.  . bimatoprost (LUMIGAN) 0.01 % SOLN Place 1 drop into both eyes at bedtime.  . fluticasone (FLONASE) 50 MCG/ACT nasal spray Place 2 sprays into both nostrils daily.  Marland Kitchen loratadine (CLARITIN) 10 MG tablet Take 1 tablet (10 mg total) by mouth daily.  . metoprolol tartrate (LOPRESSOR) 25 MG tablet Take 0.5 tablets (12.5 mg total) by mouth 2 (two) times daily.  . timolol (TIMOPTIC) 0.25 % ophthalmic solution INT 1 GTT IN OU QAM   No current facility-administered medications on file prior to visit.     Review of Systems  Constitutional: Negative for chills and fever.  HENT: Negative.   Respiratory: Negative for chest tightness, shortness of breath and wheezing.     Cardiovascular: Negative for chest pain, palpitations and leg swelling.  Gastrointestinal: Negative for abdominal pain, nausea and vomiting.  Endocrine: Negative.   Genitourinary: Negative for discharge, dysuria, penile pain, testicular pain and urgency.  Musculoskeletal: Negative for arthralgias, back pain and joint swelling.  Skin: Negative.   Neurological: Negative for dizziness, weakness, numbness and headaches.  Psychiatric/Behavioral: Negative for dysphoric mood and sleep disturbance.   Per HPI unless specifically indicated above     Objective:    BP 138/84 (BP Location: Right Arm, Cuff Size: Normal)   Pulse 81   Temp 98.5 F (36.9 C) (Oral)   Resp 16   Ht 5\' 6"  (1.676 m)   Wt 160 lb (72.6 kg)   BMI 25.82 kg/m   Wt Readings from Last 3 Encounters:  10/05/15 160 lb (72.6 kg)  07/18/15 159 lb (72.1 kg)  06/18/15 156 lb (70.8 kg)    Physical Exam  Constitutional: He is oriented to person, place, and time. He appears well-developed and well-nourished. No distress.  HENT:  Head: Normocephalic and atraumatic.  Neck: Neck supple. No thyromegaly present.  Cardiovascular: Normal rate, regular rhythm and normal heart sounds.  Exam reveals no gallop and no friction rub.   No murmur heard. Pulmonary/Chest: Effort normal and breath sounds normal. He has no wheezes.  Abdominal: Soft. Bowel sounds are normal. He exhibits no distension. There is no  tenderness. There is no rebound.  Musculoskeletal: Normal range of motion. He exhibits no edema or tenderness.  Neurological: He is alert and oriented to person, place, and time. He has normal reflexes.  Skin: Skin is warm and dry. No rash noted. No erythema.  Psychiatric: He has a normal mood and affect. His behavior is normal. Thought content normal.   Diabetic Foot Exam - Simple   Simple Foot Form Diabetic Foot exam was performed with the following findings:  Yes 10/05/2015 12:11 PM  Visual Inspection No deformities, no ulcerations,  no other skin breakdown bilaterally:  Yes Sensation Testing Intact to touch and monofilament testing bilaterally:  Yes Pulse Check Posterior Tibialis and Dorsalis pulse intact bilaterally:  Yes Comments     Results for orders placed or performed in visit on 10/05/15  POCT HgB A1C  Result Value Ref Range   Hemoglobin A1C 6.2       Assessment & Plan:   Problem List Items Addressed This Visit      Digestive   Acid reflux    Renewed medications for extended travel.       Relevant Medications   ranitidine (ZANTAC) 150 MG capsule   omeprazole (PRILOSEC) 20 MG capsule     Endocrine   Diabetes ([place]) - Primary    A1C is mildly elevated. Will return back to 5mg  glucotrol dosing. Pt advised to check sugars closely in [place]. If Sugars are low- take 1/2 tablet only. ACE for renal protection. UA micro albumin complete. Foot exam UTD.  Eye exam q6 mos 2/2 cataracts.  Check CMP.  Follow-up 6 mos or when pt returns from [place].       Relevant Medications   EFFERVESCENT PAIN RELIEF [phone] MG TBEF tablet   glipiZIDE (GLUCOTROL XL) 5 MG 24 hr tablet   Other Relevant Orders   POCT HgB A1C (Completed)   COMPLETE METABOLIC PANEL WITH GFR   Lipid Profile    Other Visit Diagnoses   None.     Meds ordered this encounter  Medications  . Calcium Citrate-Vitamin D 1000-400 LIQD  . EFFERVESCENT PAIN RELIEF [phone] MG TBEF tablet  . glipiZIDE (GLUCOTROL XL) 5 MG 24 hr tablet    Sig: Take 1 tablet (5 mg total) by mouth daily with breakfast.    Dispense:  90 tablet    Refill:  3    Order Specific Question:   Supervising Provider    Answer:   Arlis Porta [phone]  . ranitidine (ZANTAC) 150 MG capsule    Sig: Take 1 capsule (150 mg total) by mouth 2 (two) times daily.    Dispense:  180 capsule    Refill:  3    Order Specific Question:   Supervising Provider    Answer:   Arlis Porta [phone]  . omeprazole (PRILOSEC) 20 MG capsule    Sig: Take 1  capsule (20 mg total) by mouth daily.    Dispense:  90 capsule    Refill:  3    **Patient requests 90 days supply**    Order Specific Question:   Supervising Provider    Answer:   Arlis Porta [phone]      Follow up plan: Return in about 6 months (around 04/06/2016), or if symptoms worsen or fail to improve.

## 2015-10-17 ENCOUNTER — Ambulatory Visit: Payer: Commercial Managed Care - HMO | Admitting: Family Medicine

## 2015-10-24 ENCOUNTER — Ambulatory Visit: Payer: Commercial Managed Care - HMO | Admitting: Family Medicine

## 2015-12-19 ENCOUNTER — Ambulatory Visit: Payer: Commercial Managed Care - HMO | Admitting: Family Medicine

## 2016-02-11 ENCOUNTER — Ambulatory Visit (INDEPENDENT_AMBULATORY_CARE_PROVIDER_SITE_OTHER): Payer: Commercial Managed Care - HMO | Admitting: Family Medicine

## 2016-02-11 ENCOUNTER — Ambulatory Visit
Admission: RE | Admit: 2016-02-11 | Discharge: 2016-02-11 | Disposition: A | Discharge status: Home / Self Care | Payer: Commercial Managed Care - HMO | Source: Ambulatory Visit | Attending: Family Medicine | Admitting: Family Medicine

## 2016-02-11 ENCOUNTER — Other Ambulatory Visit: Payer: Self-pay | Admitting: Family Medicine

## 2016-02-11 ENCOUNTER — Encounter: Payer: Self-pay | Admitting: Family Medicine

## 2016-02-11 VITALS — BP 136/89 | HR 57 | Temp 98.0°F | Resp 16 | Ht 66.0 in | Wt 157.0 lb

## 2016-02-11 DIAGNOSIS — R1032 Left lower quadrant pain: Secondary | ICD-10-CM | POA: Insufficient documentation

## 2016-02-11 DIAGNOSIS — R197 Diarrhea, unspecified: Secondary | ICD-10-CM | POA: Diagnosis not present

## 2016-02-11 DIAGNOSIS — Z789 Other specified health status: Secondary | ICD-10-CM | POA: Diagnosis not present

## 2016-02-11 DIAGNOSIS — E1136 Type 2 diabetes mellitus with diabetic cataract: Secondary | ICD-10-CM

## 2016-02-11 MED ORDER — DICYCLOMINE HCL 10 MG PO CAPS: 10.0000 mg | ORAL_CAPSULE | Freq: Three times a day (TID) | ORAL | 1 refills | Status: DC

## 2016-02-11 MED ORDER — POLYETHYLENE GLYCOL 3350 17 GM/SCOOP PO POWD: 17.0000 g | Freq: Every day | ORAL | 0 refills | Status: DC | PRN

## 2016-02-11 NOTE — Progress Notes (Signed)
Subjective:    Patient ID: Ross Riley, male    DOB: 1950-09-23, 65 y.o.   MRN: [id number]  Ross Riley is a 65 y.o. male presenting on 02/11/2016 for Abdominal Pain (Left side onset 2 weeks ) and Diarrhea (onset 2 weeks but no nausea )  History provided by patient in [place] without difficulty. He declined use of [facility] practitioner.  HPI   LEFT LOWER ABDOMINAL PAIN, Chronic recurrent: - Reports recurrent problem with LLQ abdominal pain over past several months, and similar pain in past with prior resolution without treatment. Recently he has returned from home country of [place] (was there for past 4 months), he had similar pain at that time that resolved within few days, now with recurrence over past 2 weeks without resolution. - Today describes LLQ intermittent throbbing cramping mild to moderate severity pain, lasts for 1+ hours at a time, some days he has 0 pain over past 2 weeks, worse sometimes with eating (worse with eating meat or beef), no change with activity ambulation or rest. Does have history of GERD but this is controlled. - Admits mild diarrhea over past 2 weeks, loose sometimes 1-2 stools a day, but not watery. No other significant change to bowel habits, without constipation, does not have straining - Also he takes prophylaxis for parasite on returning to [place] Farnitox 500mg  BID for 3 days - Prior history of colonoscopy at [facility] for screening >10 yr ago and reported normal result, now he has papers to complete to schedule next colonoscopy - Denies any fevers/chills, nausea, vomiting, unintentional weight loss, night sweats, abdominal distention, rectal bleeding dark stools constipation   Social History  Substance Use Topics  . Smoking status: Former Smoker    Packs/day: 1.00    Years: 10.00    Types: Cigarettes    Quit date: 03/04/1991  . Smokeless tobacco: Never Used  . Alcohol use 0.0 oz/week     Comment: occasional    Review of  Systems Per HPI unless specifically indicated above     Objective:    BP 136/89   Pulse (!) 57   Temp 98 F (36.7 C) (Oral)   Resp 16   Ht 5\' 6"  (1.676 m)   Wt 157 lb (71.2 kg)   BMI 25.34 kg/m   Wt Readings from Last 3 Encounters:  02/11/16 157 lb (71.2 kg)  10/05/15 160 lb (72.6 kg)  07/18/15 159 lb (72.1 kg)    Physical Exam  Constitutional: He appears well-developed and well-nourished. No distress.  Well-appearing, comfortable, cooperative  HENT:  Head: Normocephalic and atraumatic.  Mouth/Throat: Oropharynx is clear and moist.  Neck: Normal range of motion. Neck supple.  Cardiovascular: Normal rate, regular rhythm, normal heart sounds and intact distal pulses.   No murmur heard. Pulmonary/Chest: Effort normal and breath sounds normal. No respiratory distress. He has no wheezes. He has no rales.  Abdominal: Soft. Bowel sounds are normal. He exhibits no distension and no mass. There is no tenderness. There is no rebound and no guarding.  Negative Murphy's sign (RUQ), Negative McBurnye's RLQ  Musculoskeletal: Normal range of motion. He exhibits no edema or tenderness.  Low Back Inspection: Normal appearance, no spinal deformity, symmetrical. Palpation: No tenderness over spinous processes. Mild Right lower lumbar paraspinal tenderness without spasm. ROM: Full active ROM forward flex / back extension, rotation L/R without discomfort Special Testing: Seated SLR negative for radicular pain bilaterally  Strength: Bilateral hip flex/ext 5/5, knee flex/ext 5/5, ankle  dorsiflex/plantarflex 5/5 Neurovascular: intact distal sensation to light touch   Neurological: He is alert.  Skin: Skin is warm and dry. No rash noted. He is not diaphoretic.  Psychiatric: His behavior is normal.  Nursing note and vitals reviewed.     I have personally reviewed the radiology report from [facility] abdominal X-ray 02/11/16.  CLINICAL DATA:  Left lower quadrant abdominal pain for 2  weeks.  EXAM: ABDOMEN - 1 VIEW  COMPARISON:  Radiograph of December 29, 2009.  FINDINGS: The bowel gas pattern is normal. No radio-opaque calculi or other significant radiographic abnormality are seen.  IMPRESSION: No evidence of bowel obstruction or ileus.   Electronically Signed   By: Marijo Conception, M.D.   On: 02/11/2016 11:58   I have personally reviewed the following lab results from 10/2015.    Results for orders placed or performed in visit on 10/05/15  COMPLETE METABOLIC PANEL WITH GFR  Result Value Ref Range   Sodium 139 135 - 146 mmol/L   Potassium 4.5 3.5 - 5.3 mmol/L   Chloride 100 98 - 110 mmol/L   CO2 29 20 - 31 mmol/L   Glucose, Bld 150 (H) 65 - 99 mg/dL   BUN 8 7 - 25 mg/dL   Creat 0.97 0.70 - 1.25 mg/dL   Total Bilirubin 0.6 0.2 - 1.2 mg/dL   Alkaline Phosphatase 53 40 - 115 U/L   AST 19 10 - 35 U/L   ALT 16 9 - 46 U/L   Total Protein 7.4 6.1 - 8.1 g/dL   Albumin 4.3 3.6 - 5.1 g/dL   Calcium 9.1 8.6 - 10.3 mg/dL   GFR, Est African American >89 >=60 mL/min   GFR, Est Non African American 82 >=60 mL/min  Lipid Profile  Result Value Ref Range   Cholesterol 208 (H) 125 - 200 mg/dL   Triglycerides 212 (H) <150 mg/dL   HDL 58 >=40 mg/dL   Total CHOL/HDL Ratio 3.6 <=5.0 Ratio   VLDL 42 (H) <30 mg/dL   LDL Cholesterol 108 <130 mg/dL  POCT HgB A1C  Result Value Ref Range   Hemoglobin A1C 6.2       Assessment & Plan:   Problem List Items Addressed This Visit    Abdominal pain, LLQ (left lower quadrant) - Primary    Recurrent chronic intermittent LLQ abdominal pain with some mild diarrhea over past 4 months, with international travel to home country [place]. No sick contacts. No known parasitic infection (did take prophylaxis antibiotic) - No acute abdominal pain, benign exam, history not suggestive of LBP, renal colick. Considered vascular etiology with DM, smoker, HTN, but symptoms not indicative of AAA or mesenteric ischemia  Plan: 1.  Check [facility] today - reviewed results, unremarkable without obvious cause, no stool burden, ileus or other cause 2. Check stool culture and ova&parasite exam given international travel 3. Start Bentyl 10mg  TID PRN 4. Regular bowels with Miralax daily may inc to 2 doses, up to 1-2 weeks then stop, given symptoms with eating meat worse, possible change in diet changes back in [place] 5. Advised to follow-up with [facility] for next due screening colonoscopy, notify office if need referral 6. Follow-up 4 weeks, or sooner if needed, return criteria given. Consider parasitic treatment if needed.      Relevant Medications   dicyclomine (BENTYL) 10 MG capsule   polyethylene glycol powder (GLYCOLAX/MIRALAX) powder   Other Relevant Orders   DG Abd 1 View (Completed)   Ova and parasite examination  Stool culture    Other Visit Diagnoses    Diarrhea, unspecified type       Relevant Orders   Ova and parasite examination   Stool culture   Recent history of foreign travel       Relevant Orders   Ova and parasite examination   Stool culture      Meds ordered this encounter  Medications  . nitazoxanide (ALINIA) 500 MG tablet    Sig: Take 500 mg by mouth 2 (two) times daily with a meal.  . dicyclomine (BENTYL) 10 MG capsule    Sig: Take 1 capsule (10 mg total) by mouth 4 (four) times daily -  before meals and at bedtime.    Dispense:  60 capsule    Refill:  1  . polyethylene glycol powder (GLYCOLAX/MIRALAX) powder    Sig: Take 17 g by mouth daily as needed.    Dispense:  3350 g    Refill:  0      Follow up plan: Return in about 4 weeks (around 03/10/2016) for abdominal pain.  [name], [facility] 02/11/2016, 3:51 PM

## 2016-02-11 NOTE — Patient Instructions (Signed)
Thank you for coming in to clinic today.  1. Follow-up with [facility] for Colonoscopy  Please schedule a follow-up appointment with Dr. Parks Ranger in 4-6 weeks for Left Lower Abdominal Pain  If you have any other questions or concerns, please feel free to call the clinic or send a message through [facility]. You may also schedule an earlier appointment if necessary.  Ross Putnam, DO [facility]

## 2016-02-11 NOTE — Assessment & Plan Note (Signed)
Recurrent chronic intermittent LLQ abdominal pain with some mild diarrhea over past 4 months, with international travel to home country [place]. No sick contacts. No known parasitic infection (did take prophylaxis antibiotic) - No acute abdominal pain, benign exam, history not suggestive of LBP, renal colick. Considered vascular etiology with DM, smoker, HTN, but symptoms not indicative of AAA or mesenteric ischemia  Plan: 1. Check [facility] today - reviewed results, unremarkable without obvious cause, no stool burden, ileus or other cause 2. Check stool culture and ova&parasite exam given international travel 3. Start Bentyl 10mg  TID PRN 4. Regular bowels with Miralax daily may inc to 2 doses, up to 1-2 weeks then stop, given symptoms with eating meat worse, possible change in diet changes back in [place] 5. Advised to follow-up with [facility] for next due screening colonoscopy, notify office if need referral 6. Follow-up 4 weeks, or sooner if needed, return criteria given. Consider parasitic treatment if needed.

## 2016-02-12 MED ORDER — GLIPIZIDE ER 5 MG PO TB24: 5.0000 mg | ORAL_TABLET | Freq: Every day | ORAL | 3 refills | Status: DC

## 2016-02-14 DIAGNOSIS — Z789 Other specified health status: Secondary | ICD-10-CM | POA: Diagnosis not present

## 2016-02-14 DIAGNOSIS — R197 Diarrhea, unspecified: Secondary | ICD-10-CM | POA: Diagnosis not present

## 2016-02-14 DIAGNOSIS — R1032 Left lower quadrant pain: Secondary | ICD-10-CM | POA: Diagnosis not present

## 2016-02-18 DIAGNOSIS — H2513 Age-related nuclear cataract, bilateral: Secondary | ICD-10-CM | POA: Diagnosis not present

## 2016-02-18 DIAGNOSIS — H401132 Primary open-angle glaucoma, bilateral, moderate stage: Secondary | ICD-10-CM | POA: Diagnosis not present

## 2016-02-18 LAB — STOOL CULTURE

## 2016-02-18 LAB — OVA AND PARASITE EXAMINATION: OP: NONE SEEN

## 2016-04-01 DIAGNOSIS — H25013 Cortical age-related cataract, bilateral: Secondary | ICD-10-CM | POA: Diagnosis not present

## 2016-04-01 DIAGNOSIS — H25043 Posterior subcapsular polar age-related cataract, bilateral: Secondary | ICD-10-CM | POA: Diagnosis not present

## 2016-04-01 DIAGNOSIS — H401132 Primary open-angle glaucoma, bilateral, moderate stage: Secondary | ICD-10-CM | POA: Diagnosis not present

## 2016-04-01 DIAGNOSIS — H2511 Age-related nuclear cataract, right eye: Secondary | ICD-10-CM | POA: Diagnosis not present

## 2016-04-01 DIAGNOSIS — H18413 Arcus senilis, bilateral: Secondary | ICD-10-CM | POA: Diagnosis not present

## 2016-04-01 DIAGNOSIS — H2513 Age-related nuclear cataract, bilateral: Secondary | ICD-10-CM | POA: Diagnosis not present

## 2016-04-18 DIAGNOSIS — I10 Essential (primary) hypertension: Secondary | ICD-10-CM | POA: Diagnosis not present

## 2016-04-18 DIAGNOSIS — Z1211 Encounter for screening for malignant neoplasm of colon: Secondary | ICD-10-CM | POA: Diagnosis not present

## 2016-04-28 DIAGNOSIS — H2511 Age-related nuclear cataract, right eye: Secondary | ICD-10-CM | POA: Diagnosis not present

## 2016-04-28 DIAGNOSIS — H2513 Age-related nuclear cataract, bilateral: Secondary | ICD-10-CM | POA: Diagnosis not present

## 2016-04-29 DIAGNOSIS — H2512 Age-related nuclear cataract, left eye: Secondary | ICD-10-CM | POA: Diagnosis not present

## 2016-05-14 ENCOUNTER — Other Ambulatory Visit: Payer: Self-pay | Admitting: Family Medicine

## 2016-05-14 DIAGNOSIS — I129 Hypertensive chronic kidney disease with stage 1 through stage 4 chronic kidney disease, or unspecified chronic kidney disease: Secondary | ICD-10-CM

## 2016-05-14 DIAGNOSIS — N182 Chronic kidney disease, stage 2 (mild): Principal | ICD-10-CM

## 2016-05-14 MED ORDER — METOPROLOL TARTRATE 25 MG PO TABS: 12.5000 mg | ORAL_TABLET | Freq: Two times a day (BID) | ORAL | 3 refills | Status: DC

## 2016-05-14 MED ORDER — BENAZEPRIL-HYDROCHLOROTHIAZIDE 10-12.5 MG PO TABS: 1.0000 | ORAL_TABLET | Freq: Every day | ORAL | 3 refills | Status: DC

## 2016-05-19 DIAGNOSIS — H2513 Age-related nuclear cataract, bilateral: Secondary | ICD-10-CM | POA: Diagnosis not present

## 2016-05-19 DIAGNOSIS — H2512 Age-related nuclear cataract, left eye: Secondary | ICD-10-CM | POA: Diagnosis not present

## 2016-05-20 ENCOUNTER — Emergency Department: Payer: Medicare HMO

## 2016-05-20 ENCOUNTER — Emergency Department
Admission: EM | Admit: 2016-05-20 | Discharge: 2016-05-21 | Disposition: A | Discharge status: Other Acute Inpatient Hospital | Payer: Medicare HMO | Attending: Emergency Medicine | Admitting: Emergency Medicine

## 2016-05-20 ENCOUNTER — Encounter: Payer: Self-pay | Admitting: Emergency Medicine

## 2016-05-20 DIAGNOSIS — R110 Nausea: Secondary | ICD-10-CM | POA: Diagnosis not present

## 2016-05-20 DIAGNOSIS — R51 Headache: Secondary | ICD-10-CM | POA: Diagnosis not present

## 2016-05-20 DIAGNOSIS — E1122 Type 2 diabetes mellitus with diabetic chronic kidney disease: Secondary | ICD-10-CM | POA: Insufficient documentation

## 2016-05-20 DIAGNOSIS — I129 Hypertensive chronic kidney disease with stage 1 through stage 4 chronic kidney disease, or unspecified chronic kidney disease: Secondary | ICD-10-CM | POA: Diagnosis not present

## 2016-05-20 DIAGNOSIS — Z87891 Personal history of nicotine dependence: Secondary | ICD-10-CM | POA: Diagnosis not present

## 2016-05-20 DIAGNOSIS — Z79899 Other long term (current) drug therapy: Secondary | ICD-10-CM | POA: Insufficient documentation

## 2016-05-20 DIAGNOSIS — G08 Intracranial and intraspinal phlebitis and thrombophlebitis: Secondary | ICD-10-CM | POA: Diagnosis not present

## 2016-05-20 DIAGNOSIS — R001 Bradycardia, unspecified: Secondary | ICD-10-CM | POA: Insufficient documentation

## 2016-05-20 DIAGNOSIS — N182 Chronic kidney disease, stage 2 (mild): Secondary | ICD-10-CM | POA: Diagnosis not present

## 2016-05-20 DIAGNOSIS — Z7982 Long term (current) use of aspirin: Secondary | ICD-10-CM | POA: Diagnosis not present

## 2016-05-20 DIAGNOSIS — I636 Cerebral infarction due to cerebral venous thrombosis, nonpyogenic: Secondary | ICD-10-CM | POA: Diagnosis not present

## 2016-05-20 DIAGNOSIS — R42 Dizziness and giddiness: Secondary | ICD-10-CM | POA: Diagnosis not present

## 2016-05-20 LAB — BASIC METABOLIC PANEL
Anion gap: 8 (ref 5–15)
BUN: 13 mg/dL (ref 6–20)
CALCIUM: 9.3 mg/dL (ref 8.9–10.3)
CO2: 28 mmol/L (ref 22–32)
Chloride: 103 mmol/L (ref 101–111)
Creatinine, Ser: 0.93 mg/dL (ref 0.61–1.24)
GFR calc Af Amer: >60 mL/min (ref >60)
Glucose, Bld: 108 mg/dL — ABNORMAL HIGH (ref 65–99)
POTASSIUM: 4 mmol/L (ref 3.5–5.1)
Sodium: 139 mmol/L (ref 135–145)

## 2016-05-20 LAB — URINALYSIS, COMPLETE (UACMP) WITH MICROSCOPIC
BILIRUBIN URINE: NEGATIVE
Bacteria, UA: NONE SEEN
Glucose, UA: NEGATIVE mg/dL
Ketones, ur: NEGATIVE mg/dL
LEUKOCYTES UA: NEGATIVE
Nitrite: NEGATIVE
PH: 6 (ref 5.0–8.0)
Protein, ur: NEGATIVE mg/dL
SPECIFIC GRAVITY, URINE: 1.027 (ref 1.005–1.030)
Squamous Epithelial / HPF: NONE SEEN

## 2016-05-20 LAB — PROTIME-INR
INR: 0.97
Prothrombin Time: 12.9 seconds (ref 11.4–15.2)

## 2016-05-20 LAB — CBC
HEMATOCRIT: 45.6 % (ref 40.0–52.0)
Hemoglobin: 15.6 g/dL (ref 13.0–18.0)
MCH: 29.9 pg (ref 26.0–34.0)
MCHC: 34.2 g/dL (ref 32.0–36.0)
MCV: 87.4 fL (ref 80.0–100.0)
PLATELETS: 201 10*3/uL (ref 150–440)
RBC: 5.22 MIL/uL (ref 4.40–5.90)
RDW: 13.1 % (ref 11.5–14.5)
WBC: 6.6 10*3/uL (ref 3.8–10.6)

## 2016-05-20 LAB — APTT: aPTT: 24 seconds (ref 24–36)

## 2016-05-20 MED ORDER — HEPARIN BOLUS VIA INFUSION
4300.0000 [IU] | Freq: Once | INTRAVENOUS | Status: AC
  Administered 2016-05-20: 4300 [IU] via INTRAVENOUS
  Filled 2016-05-20: qty 4300

## 2016-05-20 MED ORDER — MECLIZINE HCL 25 MG PO TABS
25.0000 mg | ORAL_TABLET | Freq: Once | ORAL | Status: AC
  Administered 2016-05-20: 25 mg via ORAL
  Filled 2016-05-20: qty 1

## 2016-05-20 MED ORDER — SODIUM CHLORIDE 0.9 % IV BOLUS (SEPSIS)
1000.0000 mL | Freq: Once | INTRAVENOUS | Status: AC
  Administered 2016-05-20: 1000 mL via INTRAVENOUS

## 2016-05-20 MED ORDER — ONDANSETRON 4 MG PO TBDP: 4.0000 mg | ORAL_TABLET | Freq: Three times a day (TID) | ORAL | 0 refills | Status: DC | PRN

## 2016-05-20 MED ORDER — HEPARIN (PORCINE) IN NACL 100-0.45 UNIT/ML-% IJ SOLN
1200.0000 [IU]/h | INTRAMUSCULAR | Status: DC
  Administered 2016-05-20: 1200 [IU]/h via INTRAVENOUS
  Filled 2016-05-20: qty 250

## 2016-05-20 MED ORDER — IOPAMIDOL (ISOVUE-370) INJECTION 76%
75.0000 mL | Freq: Once | INTRAVENOUS | Status: AC | PRN
  Administered 2016-05-20: 75 mL via INTRAVENOUS

## 2016-05-20 MED ORDER — LORAZEPAM 2 MG/ML IJ SOLN
0.5000 mg | Freq: Once | INTRAMUSCULAR | Status: AC
  Administered 2016-05-20: 0.5 mg via INTRAVENOUS
  Filled 2016-05-20: qty 1

## 2016-05-20 MED ORDER — ONDANSETRON HCL 4 MG/2ML IJ SOLN
4.0000 mg | Freq: Once | INTRAMUSCULAR | Status: AC
  Administered 2016-05-20: 4 mg via INTRAVENOUS
  Filled 2016-05-20: qty 2

## 2016-05-20 MED ORDER — MECLIZINE HCL 32 MG PO TABS: 32.0000 mg | ORAL_TABLET | Freq: Three times a day (TID) | ORAL | 0 refills | Status: DC | PRN

## 2016-05-20 MED ORDER — ACETAMINOPHEN 500 MG PO TABS
1000.0000 mg | ORAL_TABLET | Freq: Once | ORAL | Status: AC
  Administered 2016-05-20: 1000 mg via ORAL
  Filled 2016-05-20: qty 2

## 2016-05-20 NOTE — ED Notes (Signed)
Patient given urinal to provide urine specimen.

## 2016-05-20 NOTE — ED Triage Notes (Addendum)
Pt had left eye surgery yesterday for glaucoma, reports nausea, headache, dizziness since. Pt with vomiting x1. Pt A/O upon arrival. Pt reports he has not taken any medications today.

## 2016-05-20 NOTE — Discharge Instructions (Signed)
Please make an appointment with your primary care physician tomorrow for re-evaluation.  Please return to the emergency department if you develop severe pain, inability to keep down fluids, numbness, tingling or weakness, changes in vision, speech or mental status, fever, or any other symptoms concerning to you.

## 2016-05-20 NOTE — ED Provider Notes (Signed)
[facility] Emergency Department Provider Note  ____________________________________________  Time seen: Approximately 2:13 PM  I have reviewed the triage vital signs and the nursing notes.   HISTORY  Chief Complaint Dizziness and Headache    HPI Ross Riley is a 66 y.o. male with a history of cataracts, history of vertigo, HTN, HL and DM presenting with positional lightheadedness and vomiting. The patient underwent left cataract surgery yesterday and after his sedation immediately had lightheadedness. He was told by the postoperative team that it was normal and went home. Overnight, his symptoms persisted and then this morning he had a large bout of emesis. He has not had any trauma, fever, abdominal pain, or diarrhea. He does have a mild diffuse headache area did he denies any eye pain or changes in vision. No numbness when laying or weakness, or difficulty with balance.   Past Medical History:  Diagnosis Date  . Arthritis   . BPH (benign prostatic hyperplasia)   . Chronic kidney disease    kidney stones  . Diabetes mellitus without complication ([place])   . Dystonia   . ED (erectile dysfunction)   . GERD (gastroesophageal reflux disease)   . Glaucoma (increased eye pressure)   . Hematuria   . Hyperlipidemia   . Hypertension     Patient Active Problem List   Diagnosis Date Noted  . Abdominal pain, LLQ (left lower quadrant) 02/11/2016  . Seasonal allergies 01/24/2015  . BPH (benign prostatic hyperplasia) 12/06/2014  . Pain in joint, ankle and foot 10/26/2014  . Gonalgia 10/26/2014  . H/O arthritis 10/26/2014  . Bilateral cataracts 10/26/2014  . Dystonia 10/26/2014  . ED (erectile dysfunction) of organic origin 10/26/2014  . Acid reflux 10/26/2014  . Hyperlipidemia 10/26/2014  . Benign hypertension with chronic kidney disease, stage II 10/26/2014  . Diabetes ([place]) 08/02/2014  . Focal dystonia 07/22/2012    Past Surgical History:   Procedure Laterality Date  . EXTRACORPOREAL SHOCK WAVE LITHOTRIPSY Right   . KIDNEY STONE SURGERY  2009  . PROSTATE SURGERY  2011  . TRANSURETHRAL RESECTION OF PROSTATE N/A 12/06/2014   Procedure: TRANSURETHRAL RESECTION OF THE PROSTATE (TURP);  Surgeon: Nickie Retort, MD;  Location: [facility] ORS;  Service: Urology;  Laterality: N/A;    Current Outpatient Rx  . Order #: [id number] Class: OTC  . Order #: [id number] Class: Normal  . Order #: [id number] Class: Historical Med  . Order #: [id number] Class: Historical Med  . Order #: [id number] Class: Historical Med  . Order #: [id number] Class: Historical Med  . Order #: [id number] Class: Normal  . Order #: [id number] Class: OTC  . Order #: [id number] Class: Normal  . Order #: [id number] Class: Normal  . Order #: [id number] Class: Historical Med  . Order #: [id number] Class: Normal  . Order #: [id number] Class: Historical Med  . Order #: [id number] Class: Normal  . Order #: [id number] Class: Normal  . Order #: [id number] Class: Historical Med  . Order #: [id number] Class: Normal  . Order #: [id number] Class: Historical Med    Allergies Metformin and related and Metformin hcl  No family history on file.  Social History Social History  Substance Use Topics  . Smoking status: Former Smoker    Packs/day: 1.00    Years: 10.00    Types: Cigarettes    Quit date: 03/04/1991  . Smokeless tobacco: Never Used  . Alcohol use 0.0 oz/week     Comment: occasional    Review of Systems Constitutional: No fever/chills.Positive lightheadedness. No syncope. Eyes: No visual changes. No blurred or double vision. Cataract  surgery yesterday. ENT: No sore throat. No congestion or rhinorrhea. Cardiovascular: Denies chest pain. Denies palpitations. Respiratory: Denies shortness of breath.  No cough. Gastrointestinal: No abdominal pain.  Positive nausea, positive vomiting.  No diarrhea.  No constipation. Genitourinary: Negative for dysuria. Musculoskeletal: Negative for back  pain. Skin: Negative for rash. Neurological: Positive for headaches. No focal numbness, tingling or weakness.   10-point ROS otherwise negative.  ____________________________________________   PHYSICAL EXAM:  VITAL SIGNS: ED Triage Vitals [05/20/16 1342]  Enc Vitals Group     BP (!) 164/101     Pulse Rate (!) 56     Resp 16     Temp 97.6 F (36.4 C)     Temp Source Oral     SpO2 97 %     Weight 158 lb (71.7 kg)     Height 5\' 6"  (1.676 m)     Head Circumference      Peak Flow      Pain Score 7     Pain Loc      Pain Edu?      Excl. in [employer]?     Constitutional: Alert and oriented. Uncomfortable appearing but in no acute distress. Answers questions appropriately. Eyes: Conjunctivae are normal.  EOMI. pupils are equal and reactive bilaterally but the left pupil is more sluggish than the right. No horizontal or vertical nystagmus. Symptoms are worsened with left lateral gaze. No scleral icterus. Head: Atraumatic. No raccoon eyes or Battle sign. Nose: No congestion/rhinnorhea. Mouth/Throat: Mucous membranes are mildly dry.  Neck: No stridor.  Supple.  No neck stiffness. No meningismus. Cardiovascular: Normal rate, regular rhythm. No murmurs, rubs or gallops.  Respiratory: Normal respiratory effort.  No accessory muscle use or retractions. Lungs CTAB.  No wheezes, rales or ronchi. Musculoskeletal: No LE edema.  Neurologic: Alert and oriented 3. Speech is clear.  Face and smile symmetric. EOMI and pupils as above. No nystagmus. Tongue is midline. No pronator drift. 5 out of 5 grip, biceps, triceps, hip flexors, plantar flexion and dorsiflexion. Normal sensation to light touch in the bilateral upper and lower extremities, and face.  Skin:  Skin is warm, dry and intact. No rash noted. Psychiatric: Mood and affect are normal. Speech and behavior are normal.  Normal judgement.  ____________________________________________   LABS (all labs ordered are listed, but only abnormal  results are displayed)  Labs Reviewed  BASIC METABOLIC PANEL - Abnormal; Notable for the following:       Result Value   Glucose, Bld 108 (*)    All other components within normal limits  CBC  URINALYSIS, COMPLETE (UACMP) WITH MICROSCOPIC  CBG MONITORING, ED   ____________________________________________  EKG  ED ECG REPORT I, Eula Listen, the attending physician, personally viewed and interpreted this ECG.   Date: 05/20/2016  EKG Time: 1338  Rate: 57  Rhythm: sinus bradycardia  Axis: leftward  Intervals:none  ST&T Change: Nonspecific T-wave inversion in V1. No ST elevation  ____________________________________________  RADIOLOGY  No results found.  ____________________________________________   PROCEDURES  Procedure(s) performed: None  Procedures  Critical Care performed: No ____________________________________________   INITIAL IMPRESSION / ASSESSMENT AND PLAN / ED COURSE  Pertinent labs & imaging results that were available during my care of the patient were reviewed by me and considered in my medical decision making (see chart for details).  66 y.o. male who developed lightheadedness with nausea and vomiting after cataract surgery yesterday. The patient has no vision changes nor any eye pain, so do not  think he has an acute ophthalmologic cause for his symptoms. Acute stroke is much less likely than vertigo, or side effect to his sedation yesterday. I would also consider hypovolemia the patient was nothing by mouth for surgery and was feeling poorly yesterday. We'll get orthostatics, basic labs, and initiate treatment with meclizine and Zofran. Plan to reevaluate the patient for final disposition.  ----------------------------------------- 2:35 PM on 05/20/2016 -----------------------------------------  I've spoken with Dr. Darleen Crocker, the patient's ophthalmology surgeon from yesterday. The surgery went as expected as did his post-sedation  course. Dr. Talbert Forest will be happy to see the patient in his clinic either later today or tomorrow, and is available for any further consultation or questions.  ----------------------------------------- 3:00 PM on 05/20/2016 -----------------------------------------  The patient is just receiving his medications so we will reevaluate him after his treatment. His orthostatic vital signs are normal, his blood work is also normal. The patient will be signed out to Dr. Brenton Grills for further evaluation and final disposition. ____________________________________________  FINAL CLINICAL IMPRESSION(S) / ED DIAGNOSES  Final diagnoses:  Sinus bradycardia  Vertigo         NEW MEDICATIONS STARTED DURING THIS VISIT:  New Prescriptions   No medications on file      Eula Listen, MD 05/20/16 1503

## 2016-05-20 NOTE — Progress Notes (Signed)
ANTICOAGULATION CONSULT NOTE - Initial Consult  Pharmacy Consult for Heparin  Indication: VTE prophylaxis  Allergies  Allergen Reactions  . Metformin And Related Other (See Comments)    Headache and dizzines  . Metformin Hcl Other (See Comments)    headache and dizziness    Patient Measurements: Height: 5\' 6"  (167.6 cm) Weight: 158 lb (71.7 kg) IBW/kg (Calculated) : 63.8 Heparin Dosing Weight: 71.7 kg   Vital Signs: Temp: 97.6 F (36.4 C) (03/20 1342) Temp Source: Oral (03/20 1342) BP: 149/83 (03/20 2000) Pulse Rate: 63 (03/20 2000)  Labs:  Recent Labs  05/20/16 1353  HGB 15.6  HCT 45.6  PLT 201  CREATININE 0.93    Estimated Creatinine Clearance: 71.5 mL/min (by C-G formula based on SCr of 0.93 mg/dL).   Medical History: Past Medical History:  Diagnosis Date  . Arthritis   . BPH (benign prostatic hyperplasia)   . Chronic kidney disease    kidney stones  . Diabetes mellitus without complication ([place])   . Dystonia   . ED (erectile dysfunction)   . GERD (gastroesophageal reflux disease)   . Glaucoma (increased eye pressure)   . Hematuria   . Hyperlipidemia   . Hypertension     Medications:   (Not in a hospital admission)  Assessment: Pharmacy consulted to dose heparin in this 66 year old male admitted with VTE.  No prior anticoag noted.  CrCl = 71.5 ml/min.  Goal of Therapy:  Heparin level 0.3-0.7 units/ml Monitor platelets by anticoagulation protocol: Yes   Plan:  Give 4300 units bolus x 1 Start heparin infusion at 1200 units/hr Check anti-Xa level in 6 hours and daily while on heparin Continue to monitor H&H and platelets  Ross Riley D 05/20/2016,8:37 PM

## 2016-05-20 NOTE — ED Provider Notes (Signed)
----------------------------------------- 4:54 PM on 05/20/2016 -----------------------------------------  Patient reassessed at 4:30 PM. Reports no significant improvement of his dizziness and persistent posterior headache. We'll attempt a second dose of meclizine with low-dose Ativan. If symptoms are not substantially improved, we will need to proceed with CT angiogram of the head and neck. I discussed this with the patient and he agrees.  ----------------------------------------- 6:15 PM on 05/20/2016 -----------------------------------------  Symptoms mildly improved but still persistent. We'll obtain CT angiogram of the head and neck.  ----------------------------------------- 9:14 PM on 05/20/2016 -----------------------------------------  CT report reviewed at about 7:50 PM. Consistent with nonocclusive venous sinus thrombosis. In the context of the patient's current symptoms, I ordered anticoagulation with IV heparin. The patient wanted me to wait for his daughter and wife to return to the emergency room to discuss with them, and after they have returned and I discussed with them and everyone is informed of the results, they agree with transfer. I have contacted [facility] transfer Center. They'll page neurology to discuss transfer.   CTA HEAD FINDINGS  Anterior circulation: Petrous, cavernous, and supraclinoid segments of the internal carotid arteries are widely patent without stenosis. A1 segments widely patent. Anterior communicating artery normal. Anterior cerebral arteries patent to their distal aspects. M1 segments widely patent without stenosis or occlusion. MCA bifurcations normal. Distal MCA branches well opacified and symmetric.  Posterior circulation: Vertebral arteries widely patent to the vertebrobasilar junction. Posterior inferior cerebral arteries patent bilaterally. Basilar artery widely patent. Superior cerebellar is patent bilaterally. Both of the posterior  cerebral arteries primarily supplied via the basilar artery and are widely patent to the distal aspects.  Venous sinuses: Nonocclusive filling defect within the superior sagittal sinus (series 10, image 21), compatible with dural sinus thrombosis. Superior sagittal sinus otherwise patent. Possible minimal nonocclusive clot within knee diminutive right transverse sinus as well. Transverse and sigmoid sinuses otherwise patent. Straight sinus and vein of [place] patent.  Anatomic variants: No significant anatomic variant. No aneurysm or vascular malformation.  Delayed phase: No pathologic enhancement.  Review of the MIP images confirms the above findings  IMPRESSION: 1. Nonocclusive thrombus within the posterior aspect of the superior sagittal sinus, compatible with dural sinus thrombosis. 2. Otherwise negative CTA of the head and neck. No large vessel occlusion. No high-grade or correctable stenosis.  ----------------------------------------- 9:40 PM on 05/20/2016 -----------------------------------------  Case discussed with [facility] neurology Dr. Mina Marble who accepts to [facility] neuro floor bed. However, they have no bed availability and the patient will be on a wait list until at least tomorrow. Patient is willing to be transferred to [facility], but refuses transfer to [facility]. I will contact [facility] to see if they have bed availability.  ----------------------------------------- 9:59 PM on 05/20/2016 -----------------------------------------  Discussed with [facility] Dr. Samara Snide who accepts the patient for transfer to [facility]. Transfer center reports they'll need to call in a nurse for adequate staffing so there may be a slight delay. We will await further advice on when the patient can be transferred. Continue heparin anticoagulation for now.    ----------------------------------------- 11:35 PM on 05/20/2016 -----------------------------------------  Still on wait list for  both [facility] neuro floor and [facility] floor. No change in clinical status here in the emergency department. Case signed out to Dr. Owens Shark to follow-up on transfer destination.         CRITICAL CARE Performed by: Joni Fears, Gabriell Casimir   Total critical care time: 35 minutes  Critical care time was exclusive of separately billable procedures and treating  other patients.  Critical care was necessary to treat or prevent imminent or life-threatening deterioration.  Critical care was time spent personally by me on the following activities: development of treatment plan with patient and/or surrogate as well as nursing, discussions with consultants, evaluation of patient's response to treatment, examination of patient, obtaining history from patient or surrogate, ordering and performing treatments and interventions, ordering and review of laboratory studies, ordering and review of radiographic studies, pulse oximetry and re-evaluation of patient's condition.    Final diagnoses:  Sinus bradycardia  Vertigo  Acute cerebral venous sinus thrombosis      Carrie Mew, MD 05/20/16 2336

## 2016-05-21 DIAGNOSIS — Z9842 Cataract extraction status, left eye: Secondary | ICD-10-CM | POA: Diagnosis not present

## 2016-05-21 DIAGNOSIS — R001 Bradycardia, unspecified: Secondary | ICD-10-CM | POA: Diagnosis not present

## 2016-05-21 DIAGNOSIS — E1122 Type 2 diabetes mellitus with diabetic chronic kidney disease: Secondary | ICD-10-CM | POA: Diagnosis not present

## 2016-05-21 DIAGNOSIS — G08 Intracranial and intraspinal phlebitis and thrombophlebitis: Secondary | ICD-10-CM | POA: Diagnosis not present

## 2016-05-21 DIAGNOSIS — I636 Cerebral infarction due to cerebral venous thrombosis, nonpyogenic: Secondary | ICD-10-CM | POA: Diagnosis not present

## 2016-05-21 DIAGNOSIS — N182 Chronic kidney disease, stage 2 (mild): Secondary | ICD-10-CM | POA: Diagnosis not present

## 2016-05-21 DIAGNOSIS — D329 Benign neoplasm of meninges, unspecified: Secondary | ICD-10-CM | POA: Diagnosis not present

## 2016-05-21 DIAGNOSIS — R42 Dizziness and giddiness: Secondary | ICD-10-CM | POA: Diagnosis not present

## 2016-05-21 DIAGNOSIS — Z7982 Long term (current) use of aspirin: Secondary | ICD-10-CM | POA: Diagnosis not present

## 2016-05-21 DIAGNOSIS — D420 Neoplasm of uncertain behavior of cerebral meninges: Secondary | ICD-10-CM | POA: Diagnosis not present

## 2016-05-21 DIAGNOSIS — H8110 Benign paroxysmal vertigo, unspecified ear: Secondary | ICD-10-CM | POA: Diagnosis not present

## 2016-05-21 DIAGNOSIS — Z7984 Long term (current) use of oral hypoglycemic drugs: Secondary | ICD-10-CM | POA: Diagnosis not present

## 2016-05-21 DIAGNOSIS — D320 Benign neoplasm of cerebral meninges: Secondary | ICD-10-CM | POA: Diagnosis not present

## 2016-05-21 DIAGNOSIS — E1136 Type 2 diabetes mellitus with diabetic cataract: Secondary | ICD-10-CM | POA: Diagnosis not present

## 2016-05-21 DIAGNOSIS — I129 Hypertensive chronic kidney disease with stage 1 through stage 4 chronic kidney disease, or unspecified chronic kidney disease: Secondary | ICD-10-CM | POA: Diagnosis not present

## 2016-05-21 DIAGNOSIS — E785 Hyperlipidemia, unspecified: Secondary | ICD-10-CM | POA: Diagnosis not present

## 2016-05-21 DIAGNOSIS — I10 Essential (primary) hypertension: Secondary | ICD-10-CM | POA: Diagnosis not present

## 2016-05-21 DIAGNOSIS — E119 Type 2 diabetes mellitus without complications: Secondary | ICD-10-CM | POA: Diagnosis not present

## 2016-05-21 DIAGNOSIS — Z87891 Personal history of nicotine dependence: Secondary | ICD-10-CM | POA: Diagnosis not present

## 2016-05-21 DIAGNOSIS — H409 Unspecified glaucoma: Secondary | ICD-10-CM | POA: Diagnosis not present

## 2016-05-21 DIAGNOSIS — Z79899 Other long term (current) drug therapy: Secondary | ICD-10-CM | POA: Diagnosis not present

## 2016-05-21 DIAGNOSIS — R51 Headache: Secondary | ICD-10-CM | POA: Diagnosis not present

## 2016-05-22 DIAGNOSIS — D420 Neoplasm of uncertain behavior of cerebral meninges: Secondary | ICD-10-CM | POA: Insufficient documentation

## 2016-06-05 ENCOUNTER — Ambulatory Visit (INDEPENDENT_AMBULATORY_CARE_PROVIDER_SITE_OTHER): Payer: Medicare HMO | Admitting: Family Medicine

## 2016-06-05 ENCOUNTER — Encounter: Payer: Self-pay | Admitting: Family Medicine

## 2016-06-05 VITALS — BP 133/84 | HR 47 | Temp 98.4°F | Resp 16 | Ht 66.0 in | Wt 161.0 lb

## 2016-06-05 DIAGNOSIS — D420 Neoplasm of uncertain behavior of cerebral meninges: Secondary | ICD-10-CM

## 2016-06-05 DIAGNOSIS — R42 Dizziness and giddiness: Secondary | ICD-10-CM | POA: Diagnosis not present

## 2016-06-05 DIAGNOSIS — G248 Other dystonia: Secondary | ICD-10-CM

## 2016-06-05 DIAGNOSIS — M150 Primary generalized (osteo)arthritis: Secondary | ICD-10-CM

## 2016-06-05 DIAGNOSIS — M1712 Unilateral primary osteoarthritis, left knee: Secondary | ICD-10-CM

## 2016-06-05 DIAGNOSIS — M159 Polyosteoarthritis, unspecified: Secondary | ICD-10-CM

## 2016-06-05 NOTE — Progress Notes (Addendum)
Subjective:    Patient ID: Ross Riley, male    DOB: [birth year], 66 y.o.   MRN: [id number]  Ross Riley is a 66 y.o. male presenting on 06/05/2016 for Hospitalization Follow-up  History provided by patient in [place]. By patient preference today.  [facility] Follow-up ED 05/20/16 [facility] ED > Transferred to [facility] [phone]/21/18 - 05/22/16)  HOSPITAL FOLLOW-UP DIZZINESS/VERTIGO, Dural Sinus Thrombosis, Atypical Intracranial Meningioma - Reviewed detailed record from [facility] ED and Care Everywhere from hospitalization including H&P, progress, lab and imaging tests, and discharge summary. Briefly patient presented to ED initially following L cataract surgery, symptoms of acute dizziness, vertigo and generalized headache, he did not have focal weakness or stroke symptoms, re-evaluation in ED with Head CT showed a non occlusive thrombus in posterior aspect of superior sagittal sinus (dural sinus thrombosis), started on heparin drip, and transferred to [facility]. Later work-up at [facility] with MRI revealing likely cause of a stable atypical parafalcine meningioma causing stenosis of this sinus, prior MRI review in 2014 showed a similar reading, suggesting this was not a new problem. Neurology evaluation determined no need for anticoagulation or acute meningioma treatment. His symptoms had resolved, and he was referred to outpatient Neurosurgery to monitor meningioma and consider elective options, he is scheduled to see Neurosurgery in June 2018 - Today he reports doing well, has no further symptoms of dizziness, vertigo, headache. No new concerns or symptoms, he is not familiar with the prior diagnosis in 2014, but understands the problem. He will discuss options further with Neurosurgery - Not taking Meclizine or Zofran but has these if needed - Denies any headache, vision changes, numbness, weakness, slurred speech  Left Knee Pain / Osteoarthritis / Chronic Left Lower Ext Dystonia -  Additional complaint of worsening Left knee pain and instability. He has known history of osteoarthritis from prior imaging in 2016. Previously followed by Orthopedics in [place], he cannot recall name of doctor or practice, but will locate this. They have done some steroid injections for him in past without significant improvement. - He is asking about second opinion on his knee and understands he may need a surgical procedure. Does not want another injection now. Describes aching pain, some instability, has not had significant fall, uses cane for assistance with ambulation, has gait difficulty due to chronic LLE dystonia in left ankle / foot (chart review shows this focal dystonia since 1994, denies this is related to CVA, has tried botox injections and other, previously seen by [facility] in 2014 - Admits some knee pain and swelling, currently not painful - Wears compression knee sleeve with improvement in stability - Denies fall, trauma, redness, other joint problem  Social History  Substance Use Topics  . Smoking status: Former Smoker    Packs/day: 1.00    Years: 10.00    Types: Cigarettes    Quit date: 03/04/1991  . Smokeless tobacco: Never Used  . Alcohol use 0.0 oz/week     Comment: occasional    Review of Systems Per HPI unless specifically indicated above     Objective:    BP 133/84   Pulse (!) 47   Temp 98.4 F (36.9 C) (Oral)   Resp 16   Ht 5\' 6"  (1.676 m)   Wt 161 lb (73 kg)   SpO2 98%   BMI 25.99 kg/m   Wt Readings from Last 3 Encounters:  06/05/16 161 lb (73 kg)  05/20/16 158 lb (71.7 kg)  02/11/16 157  lb (71.2 kg)    Physical Exam  Constitutional: He is oriented to person, place, and time. He appears well-developed and well-nourished. No distress.  Well-appearing, comfortable, cooperative  HENT:  Head: Normocephalic and atraumatic.  Mouth/Throat: Oropharynx is clear and moist.  Eyes: Conjunctivae are normal. Right eye exhibits no discharge. Left eye  exhibits no discharge.  Neck: Normal range of motion. Neck supple.  Cardiovascular: Regular rhythm, normal heart sounds and intact distal pulses.   No murmur heard. Bradycardic  Pulmonary/Chest: Effort normal and breath sounds normal. No respiratory distress. He has no wheezes. He has no rales.  Musculoskeletal: He exhibits no edema.  Left Knee Inspection: Smaller appearance compared to R, some misalignment with some varus deformity. No effusion or erythema  Palpation: Non tender. Significant crepitus on motion. ROM: Full active ROM bilaterally Special Testing: Some knee instability with ligamentous testing, but difficult to appreciate given his reduced tone.  - Unable to participate in Left leg Thessaly meniscus testing Strength: Limited strength testing with focal LLE dystonia worst lower leg ankle/foot  Gait is antalgic and with L foot drop, uses cane for ambulation  Neurological: He is alert and oriented to person, place, and time. No cranial nerve deficit.  Skin: Skin is warm and dry. No rash noted. He is not diaphoretic. No erythema.  Psychiatric: He has a normal mood and affect. His behavior is normal.  Well groomed, good eye contact, normal speech and thoughts  Nursing note and vitals reviewed.   I have personally reviewed the radiology report from [facility] on 05/20/16.   CLINICAL DATA:  Initial evaluation for acute dizziness, occipital headache.  EXAM: CT ANGIOGRAPHY HEAD AND NECK  TECHNIQUE: Multidetector CT imaging of the head and neck was performed using the standard protocol during bolus administration of intravenous contrast. Multiplanar CT image reconstructions and MIPs were obtained to evaluate the vascular anatomy. Carotid stenosis measurements (when applicable) are obtained utilizing NASCET criteria, using the distal internal carotid diameter as the denominator.  CONTRAST:  75 cc of Isovue 370.  COMPARISON:  Prior CT from  12/07/2012.  FINDINGS: CT HEAD FINDINGS  Brain: Mild age-related cerebral atrophy, similar to previous. No acute intracranial hemorrhage. No evidence for acute large vessel territory infarct. No mass lesion, midline shift, or mass effect. No hydrocephalus. No extra-axial fluid collection.  Vascular: No hyperdense vessel.  Skull: Scalp soft tissues within normal limits.  Calvarium intact.  Sinuses: Paranasal sinuses are clear.  No mastoid effusion.  Orbits: Globes and orbital soft tissues within normal limits. Patient status post lens extraction bilaterally.  CTA NECK FINDINGS  Aortic arch: Visualized aortic arch of normal caliber with normal 3 vessel morphology. No high-grade stenosis about the origin of the great vessels. Visualized subclavian arteries are widely patent.  Right carotid system: Right common carotid artery patent from its origin to the bifurcation. No significant atheromatous plaque about the right bifurcation. Right ICA widely patent from the bifurcation to the skullbase. No stenosis, dissection, or vascular occlusion within the right carotid artery system.  Left carotid system: Left common carotid artery widely patent from its origin to the bifurcation. No significant atheromatous plaque about the left bifurcation. Left ICA widely patent from the bifurcation to the skullbase. No stenosis, dissection, or vascular occlusion within the left carotid artery system.  Vertebral arteries: Both of the vertebral arteries arise from the subclavian arteries. Vertebral arteries largely code dominant. Vertebral arteries widely patent within the neck without stenosis, dissection, or occlusion.  Skeleton: No acute osseus  abnormality. No worrisome lytic or blastic osseous lesions. Multilevel facet arthropathy noted within the cervical spine.  Other neck: Soft tissues of the neck demonstrate no acute abnormality. No adenopathy. Thyroid normal. Salivary  glands normal.  Upper chest: Visualized mediastinum within normal limits. Visualized lungs are clear.  Review of the MIP images confirms the above findings  CTA HEAD FINDINGS  Anterior circulation: Petrous, cavernous, and supraclinoid segments of the internal carotid arteries are widely patent without stenosis. A1 segments widely patent. Anterior communicating artery normal. Anterior cerebral arteries patent to their distal aspects. M1 segments widely patent without stenosis or occlusion. MCA bifurcations normal. Distal MCA branches well opacified and symmetric.  Posterior circulation: Vertebral arteries widely patent to the vertebrobasilar junction. Posterior inferior cerebral arteries patent bilaterally. Basilar artery widely patent. Superior cerebellar is patent bilaterally. Both of the posterior cerebral arteries primarily supplied via the basilar artery and are widely patent to the distal aspects.  Venous sinuses: Nonocclusive filling defect within the superior sagittal sinus (series 10, image 21), compatible with dural sinus thrombosis. Superior sagittal sinus otherwise patent. Possible minimal nonocclusive clot within knee diminutive right transverse sinus as well. Transverse and sigmoid sinuses otherwise patent. Straight sinus and vein of Galen patent.  Anatomic variants: No significant anatomic variant. No aneurysm or vascular malformation.  Delayed phase: No pathologic enhancement.  Review of the MIP images confirms the above findings  IMPRESSION: 1. Nonocclusive thrombus within the posterior aspect of the superior sagittal sinus, compatible with dural sinus thrombosis. 2. Otherwise negative CTA of the head and neck. No large vessel occlusion. No high-grade or correctable stenosis.   Electronically Signed   By: Jeannine Boga M.D.   On: 05/20/2016  19:41  ------------------------------------------------------------------------------------------------  I have personally reviewed the radiology report from MRI Brain w/ w/o contrast 05/21/16.  Brain MRI with and without contrast MR venogram without contrast  HISTORY: Headaches and dizziness, vertigo.  COMPARISONS: Brain MRI from 08/16/2012  TECHNIQUE: Multiplanar MR imaging of the brain is performed before and after IV contrast. MR venogram is also performed using sagittally acquired velocity encoded phase contrast sequences.  CONTRAST: 79mL Multihance  FINDINGS:  Brain MRI: The cerebellar tonsils are normally positioned. The ventricles and sulci are normal in size. There is no extra-axial collection, mass effect, or midline shift. There is no evidence of intracranial hemorrhage on susceptibility weighted images. There is no acute infarct on diffusion-weighted images. The brain parenchyma is normal in signal intensity. There is no abnormal parenchymal enhancement within the brain.  There is a right parafalcine dural based extra-axial enhancing mass located near the cranial vertex in the frontoparietal region, measuring approximately 2.0 x 1.0 cm on axial images (series 14 image 26). This is stable in appearance from the prior examination and consistent with a small meningioma. This is located immediately adjacent to the superior sagittal sinus and appears to invade the superior sagittal sinus, resulting in focal venous stenosis. In addition, the enhancement extends into the adjacent calvarium, compatible with mild osseous extension of meningioma.  Expected flow voids are seen in the circle of Willis and major intracranial arteries. The orbits and paranasal sinuses are unremarkable, aside from prior cataract surgery. The mastoid air cells are clear.  MR venogram: Venogram interpretation was utilized using the phase contrast venogram images in addition to the postcontrast 3D  SPGR images. As discussed above, there is a focal stenosis of the superior sagittal sinus in the frontoparietal region, secondary to the presumed parafalcine meningioma. This region of stenosis measures approximately  2 cm in length. The remainder of the superior sagittal sinus is otherwise normal in appearance. The transverse and sigmoid sinuses are patent; the left transverse sinus is dominant to the right. The internal cerebral veins, vein of Galen, and straight sinus are patent.  IMPRESSION: 1. No acute intracranial abnormality. 2. Findings consistent with a 2.0 x 1.0 cm right parafalcine meningioma near the cranial vertex in the frontoparietal region. This invades the superior sagittal sinus, resulting in a focal stenosis measuring approximately 2 cm in length. 3. The remainder of the dural venous sinuses are normal in appearance. There is no evidence of dural sinus thrombosis.  Electronically Signed XT:[id number] Janna Arch, MD, [facility] Radiology Electronically Signed on:05/21/2016 2:05 PM  ------------------------------------- I have personally reviewed the radiology report from Left Knee X-ray on 05/31/14.  CLINICAL DATA:  Fall last p.m.Marland Kitchen  Pain .   EXAM:  LEFT KNEE - COMPLETE 4+ VIEW   COMPARISON:  None.   FINDINGS:  Moderate tricompartment degenerative change. Degenerative changes  most prominent about the medial compartment. No evidence of fracture  or dislocation. Moderate size effusion noted.   IMPRESSION:  1. Moderate size knee joint effusion.  2. Degenerative changes left knee. Degenerative changes most  prominent about the medial compartment. No evidence of fracture or  dislocation.    Electronically Signed    By: [name]  Register    On: 05/31/2014 15:49   Results for orders placed or performed during the hospital encounter of 92/42/68  Basic metabolic panel  Result Value Ref Range   Sodium 139 135 - 145 mmol/L   Potassium 4.0 3.5 - 5.1 mmol/L    Chloride 103 101 - 111 mmol/L   CO2 28 22 - 32 mmol/L   Glucose, Bld 108 (H) 65 - 99 mg/dL   BUN 13 6 - 20 mg/dL   Creatinine, Ser 0.93 0.61 - 1.24 mg/dL   Calcium 9.3 8.9 - 10.3 mg/dL   GFR calc non Af Amer >60 >60 mL/min   GFR calc Af Amer >60 >60 mL/min   Anion gap 8 5 - 15  CBC  Result Value Ref Range   WBC 6.6 3.8 - 10.6 K/uL   RBC 5.22 4.40 - 5.90 MIL/uL   Hemoglobin 15.6 13.0 - 18.0 g/dL   HCT 45.6 40.0 - 52.0 %   MCV 87.4 80.0 - 100.0 fL   MCH 29.9 26.0 - 34.0 pg   MCHC 34.2 32.0 - 36.0 g/dL   RDW 13.1 11.5 - 14.5 %   Platelets 201 150 - 440 K/uL  Urinalysis, Complete w Microscopic  Result Value Ref Range   Color, Urine YELLOW (A) YELLOW   APPearance CLEAR (A) CLEAR   Specific Gravity, Urine 1.027 1.005 - 1.030   pH 6.0 5.0 - 8.0   Glucose, UA NEGATIVE NEGATIVE mg/dL   Hgb urine dipstick SMALL (A) NEGATIVE   Bilirubin Urine NEGATIVE NEGATIVE   Ketones, ur NEGATIVE NEGATIVE mg/dL   Protein, ur NEGATIVE NEGATIVE mg/dL   Nitrite NEGATIVE NEGATIVE   Leukocytes, UA NEGATIVE NEGATIVE   RBC / HPF 0-5 0 - 5 RBC/hpf   WBC, UA 0-5 0 - 5 WBC/hpf   Bacteria, UA NONE SEEN NONE SEEN   Squamous Epithelial / LPF NONE SEEN NONE SEEN   Mucous PRESENT   APTT  Result Value Ref Range   aPTT 24 24 - 36 seconds  Protime-INR  Result Value Ref Range   Prothrombin Time 12.9 11.4 - 15.2 seconds   INR 0.97  Assessment & Plan:   Problem List Items Addressed This Visit    Osteoarthritis of multiple joints   Relevant Medications   EFFERVESCENT PAIN RELIEF [phone] MG TBEF tablet   Other Relevant Orders   Ambulatory referral to Orthopedics   Osteoarthritis of left knee    Chronic L medial vs genearlized Knee pain (today stable) without known injury or trauma.  - Known knee OA/DJD, last imaging X-ray 05/2014 - Suspected secondary to dysfunction ambulatory mechanics with focal LLE ankle/foot dystonia changing his gait - Concern with some mechanical symptoms and instability  given age and DJD likely chronic degenerative tear - Prior Ortho in [place], with injections (failed). No prior history of knee surgery, arthroscopy  Plan: 1. By patient request referral to new Orthopedic surgery locally for second opinion, to [facility], asked patient to obtain records from [facility] 2. Defer X-ray imaging today 3. Start Tylenol 500-1000mg  per dose TID PRN breakthrough 4. Follow-up as needed, would likely benefit from continued PT in future      Relevant Medications   EFFERVESCENT PAIN RELIEF [phone] MG TBEF tablet   Other Relevant Orders   Ambulatory referral to Orthopedics   Focal dystonia    Stable, chronic problem >14 years now. It is significantly affecting his gait and limiting his ambulation which appears to have affected degenerative wear on his Left knee - Follow-up with Neurology as needed - Will proceed with Ortho referral for further eval of L knee options      Atypical intracranial meningioma ([place]) - Primary    Based on hospital records of [facility] and their review of recent MRI, appears to be stable and chronic, not requiring acute therapy - Patient symptoms have resolved, less likely to be contributed by this problem - Reassurance given asymptomatic today - Advised he follow-up as scheduled with Neurosurgery (scheduled on hospital discharge from [facility]), to discuss surveillance vs therapy options       Other Visit Diagnoses    Vertigo       Resolved        Follow up plan: Return in about 6 months (around 12/05/2016) for Diabetes A1c.  [name], [facility] 06/05/2016, 3:29 PM

## 2016-06-05 NOTE — Assessment & Plan Note (Signed)
Chronic L medial vs genearlized Knee pain (today stable) without known injury or trauma.  - Known knee OA/DJD, last imaging X-ray 05/2014 - Suspected secondary to dysfunction ambulatory mechanics with focal LLE ankle/foot dystonia changing his gait - Concern with some mechanical symptoms and instability given age and DJD likely chronic degenerative tear - Prior Ortho in [place], with injections (failed). No prior history of knee surgery, arthroscopy  Plan: 1. By patient request referral to new Orthopedic surgery locally for second opinion, to [facility], asked patient to obtain records from [facility] 2. Defer X-ray imaging today 3. Start Tylenol 500-1000mg  per dose TID PRN breakthrough 4. Follow-up as needed, would likely benefit from continued PT in future

## 2016-06-05 NOTE — Patient Instructions (Signed)
Thank you for coming in to clinic today.  1.  As discussed you do have a finding of an atypical mass outside the brain, this may have been contributing to your symptoms of dizziness  Follow-up with the Neurosurgeon as planned in June 2018  Take the medicines only as needed, however your symptoms seem resolved  ----------------  They will call you with an appointment for evaluation of your Left Knee, if they do not call you within 2 weeks then you can contact them  They will need information from your Doctor in [place], (name, location, address, phone number) or any records, if you can get this information BEFORE your visit that would be helpful  [facility] [place], [place]  [address] Phone: [phone]  Please schedule a follow-up appointment with Dr. Parks Ranger in 6 months for Diabetes A1c  If you have any other questions or concerns, please feel free to call the clinic or send a message through [place]. You may also schedule an earlier appointment if necessary.  [name], DO [place]

## 2016-06-05 NOTE — Assessment & Plan Note (Signed)
Based on hospital records of [facility] and their review of recent MRI, appears to be stable and chronic, not requiring acute therapy - Patient symptoms have resolved, less likely to be contributed by this problem - Reassurance given asymptomatic today - Advised he follow-up as scheduled with Neurosurgery (scheduled on hospital discharge from [facility]), to discuss surveillance vs therapy options

## 2016-06-05 NOTE — Assessment & Plan Note (Signed)
Stable, chronic problem >14 years now. It is significantly affecting his gait and limiting his ambulation which appears to have affected degenerative wear on his Left knee - Follow-up with Neurology as needed - Will proceed with Ortho referral for further eval of L knee options

## 2016-06-24 DIAGNOSIS — Z0100 Encounter for examination of eyes and vision without abnormal findings: Secondary | ICD-10-CM | POA: Diagnosis not present

## 2016-06-25 ENCOUNTER — Other Ambulatory Visit: Payer: Self-pay | Admitting: Orthopedic Surgery

## 2016-06-25 DIAGNOSIS — M25562 Pain in left knee: Secondary | ICD-10-CM | POA: Diagnosis not present

## 2016-06-25 DIAGNOSIS — M1712 Unilateral primary osteoarthritis, left knee: Secondary | ICD-10-CM | POA: Diagnosis not present

## 2016-06-26 ENCOUNTER — Ambulatory Visit
Admission: RE | Admit: 2016-06-26 | Discharge: 2016-06-26 | Disposition: A | Discharge status: Home / Self Care | Payer: Medicare HMO | Source: Ambulatory Visit | Attending: Orthopedic Surgery | Admitting: Orthopedic Surgery

## 2016-06-26 DIAGNOSIS — M1712 Unilateral primary osteoarthritis, left knee: Secondary | ICD-10-CM

## 2016-07-07 DIAGNOSIS — M25562 Pain in left knee: Secondary | ICD-10-CM | POA: Diagnosis not present

## 2016-07-07 DIAGNOSIS — G8929 Other chronic pain: Secondary | ICD-10-CM | POA: Diagnosis not present

## 2016-07-07 DIAGNOSIS — M1712 Unilateral primary osteoarthritis, left knee: Secondary | ICD-10-CM | POA: Diagnosis not present

## 2016-08-01 DIAGNOSIS — E119 Type 2 diabetes mellitus without complications: Secondary | ICD-10-CM | POA: Diagnosis not present

## 2016-08-01 DIAGNOSIS — H269 Unspecified cataract: Secondary | ICD-10-CM | POA: Diagnosis not present

## 2016-08-01 DIAGNOSIS — M1712 Unilateral primary osteoarthritis, left knee: Secondary | ICD-10-CM | POA: Diagnosis not present

## 2016-08-01 DIAGNOSIS — D420 Neoplasm of uncertain behavior of cerebral meninges: Secondary | ICD-10-CM | POA: Diagnosis not present

## 2016-08-01 DIAGNOSIS — I10 Essential (primary) hypertension: Secondary | ICD-10-CM | POA: Diagnosis not present

## 2016-08-01 DIAGNOSIS — H409 Unspecified glaucoma: Secondary | ICD-10-CM | POA: Diagnosis not present

## 2016-08-01 DIAGNOSIS — Z01818 Encounter for other preprocedural examination: Secondary | ICD-10-CM | POA: Diagnosis not present

## 2016-08-01 DIAGNOSIS — K219 Gastro-esophageal reflux disease without esophagitis: Secondary | ICD-10-CM | POA: Diagnosis not present

## 2016-08-01 DIAGNOSIS — E785 Hyperlipidemia, unspecified: Secondary | ICD-10-CM | POA: Diagnosis not present

## 2016-08-01 LAB — HEMOGLOBIN A1C: HEMOGLOBIN A1C: 6.3

## 2016-08-13 DIAGNOSIS — D329 Benign neoplasm of meninges, unspecified: Secondary | ICD-10-CM | POA: Diagnosis not present

## 2016-08-20 DIAGNOSIS — E119 Type 2 diabetes mellitus without complications: Secondary | ICD-10-CM | POA: Diagnosis not present

## 2016-08-20 DIAGNOSIS — E876 Hypokalemia: Secondary | ICD-10-CM | POA: Diagnosis not present

## 2016-08-20 DIAGNOSIS — K219 Gastro-esophageal reflux disease without esophagitis: Secondary | ICD-10-CM | POA: Diagnosis not present

## 2016-08-20 DIAGNOSIS — R42 Dizziness and giddiness: Secondary | ICD-10-CM | POA: Diagnosis not present

## 2016-08-20 DIAGNOSIS — R001 Bradycardia, unspecified: Secondary | ICD-10-CM | POA: Diagnosis not present

## 2016-08-20 DIAGNOSIS — G248 Other dystonia: Secondary | ICD-10-CM | POA: Diagnosis not present

## 2016-08-20 DIAGNOSIS — I10 Essential (primary) hypertension: Secondary | ICD-10-CM | POA: Diagnosis not present

## 2016-08-20 DIAGNOSIS — H409 Unspecified glaucoma: Secondary | ICD-10-CM | POA: Diagnosis not present

## 2016-08-20 DIAGNOSIS — M2550 Pain in unspecified joint: Secondary | ICD-10-CM | POA: Diagnosis not present

## 2016-08-20 DIAGNOSIS — G8918 Other acute postprocedural pain: Secondary | ICD-10-CM | POA: Diagnosis not present

## 2016-08-20 DIAGNOSIS — E785 Hyperlipidemia, unspecified: Secondary | ICD-10-CM | POA: Diagnosis not present

## 2016-08-20 DIAGNOSIS — M25562 Pain in left knee: Secondary | ICD-10-CM | POA: Diagnosis not present

## 2016-08-20 DIAGNOSIS — M1711 Unilateral primary osteoarthritis, right knee: Secondary | ICD-10-CM | POA: Diagnosis not present

## 2016-08-20 DIAGNOSIS — Z96652 Presence of left artificial knee joint: Secondary | ICD-10-CM | POA: Diagnosis not present

## 2016-08-20 DIAGNOSIS — Z471 Aftercare following joint replacement surgery: Secondary | ICD-10-CM | POA: Diagnosis not present

## 2016-08-20 DIAGNOSIS — R3129 Other microscopic hematuria: Secondary | ICD-10-CM | POA: Diagnosis not present

## 2016-08-20 DIAGNOSIS — M1712 Unilateral primary osteoarthritis, left knee: Secondary | ICD-10-CM | POA: Diagnosis not present

## 2016-08-23 DIAGNOSIS — M1990 Unspecified osteoarthritis, unspecified site: Secondary | ICD-10-CM | POA: Diagnosis not present

## 2016-08-23 DIAGNOSIS — D320 Benign neoplasm of cerebral meninges: Secondary | ICD-10-CM | POA: Diagnosis not present

## 2016-08-23 DIAGNOSIS — K219 Gastro-esophageal reflux disease without esophagitis: Secondary | ICD-10-CM | POA: Diagnosis not present

## 2016-08-23 DIAGNOSIS — E785 Hyperlipidemia, unspecified: Secondary | ICD-10-CM | POA: Diagnosis not present

## 2016-08-23 DIAGNOSIS — E119 Type 2 diabetes mellitus without complications: Secondary | ICD-10-CM | POA: Diagnosis not present

## 2016-08-23 DIAGNOSIS — H409 Unspecified glaucoma: Secondary | ICD-10-CM | POA: Diagnosis not present

## 2016-08-23 DIAGNOSIS — Z471 Aftercare following joint replacement surgery: Secondary | ICD-10-CM | POA: Diagnosis not present

## 2016-08-23 DIAGNOSIS — I10 Essential (primary) hypertension: Secondary | ICD-10-CM | POA: Diagnosis not present

## 2016-08-23 DIAGNOSIS — Z96652 Presence of left artificial knee joint: Secondary | ICD-10-CM | POA: Diagnosis not present

## 2016-08-25 DIAGNOSIS — H409 Unspecified glaucoma: Secondary | ICD-10-CM | POA: Diagnosis not present

## 2016-08-25 DIAGNOSIS — Z471 Aftercare following joint replacement surgery: Secondary | ICD-10-CM | POA: Diagnosis not present

## 2016-08-25 DIAGNOSIS — K219 Gastro-esophageal reflux disease without esophagitis: Secondary | ICD-10-CM | POA: Diagnosis not present

## 2016-08-25 DIAGNOSIS — I10 Essential (primary) hypertension: Secondary | ICD-10-CM | POA: Diagnosis not present

## 2016-08-25 DIAGNOSIS — E119 Type 2 diabetes mellitus without complications: Secondary | ICD-10-CM | POA: Diagnosis not present

## 2016-08-25 DIAGNOSIS — D320 Benign neoplasm of cerebral meninges: Secondary | ICD-10-CM | POA: Diagnosis not present

## 2016-08-25 DIAGNOSIS — Z96652 Presence of left artificial knee joint: Secondary | ICD-10-CM | POA: Diagnosis not present

## 2016-08-25 DIAGNOSIS — E785 Hyperlipidemia, unspecified: Secondary | ICD-10-CM | POA: Diagnosis not present

## 2016-08-25 DIAGNOSIS — M1990 Unspecified osteoarthritis, unspecified site: Secondary | ICD-10-CM | POA: Diagnosis not present

## 2016-08-27 DIAGNOSIS — Z471 Aftercare following joint replacement surgery: Secondary | ICD-10-CM | POA: Diagnosis not present

## 2016-08-27 DIAGNOSIS — E785 Hyperlipidemia, unspecified: Secondary | ICD-10-CM | POA: Diagnosis not present

## 2016-08-27 DIAGNOSIS — Z96652 Presence of left artificial knee joint: Secondary | ICD-10-CM | POA: Diagnosis not present

## 2016-08-27 DIAGNOSIS — I10 Essential (primary) hypertension: Secondary | ICD-10-CM | POA: Diagnosis not present

## 2016-08-27 DIAGNOSIS — H409 Unspecified glaucoma: Secondary | ICD-10-CM | POA: Diagnosis not present

## 2016-08-27 DIAGNOSIS — K219 Gastro-esophageal reflux disease without esophagitis: Secondary | ICD-10-CM | POA: Diagnosis not present

## 2016-08-27 DIAGNOSIS — D320 Benign neoplasm of cerebral meninges: Secondary | ICD-10-CM | POA: Diagnosis not present

## 2016-08-27 DIAGNOSIS — M1990 Unspecified osteoarthritis, unspecified site: Secondary | ICD-10-CM | POA: Diagnosis not present

## 2016-08-27 DIAGNOSIS — E119 Type 2 diabetes mellitus without complications: Secondary | ICD-10-CM | POA: Diagnosis not present

## 2016-08-28 DIAGNOSIS — M1990 Unspecified osteoarthritis, unspecified site: Secondary | ICD-10-CM | POA: Diagnosis not present

## 2016-08-28 DIAGNOSIS — K219 Gastro-esophageal reflux disease without esophagitis: Secondary | ICD-10-CM | POA: Diagnosis not present

## 2016-08-28 DIAGNOSIS — H409 Unspecified glaucoma: Secondary | ICD-10-CM | POA: Diagnosis not present

## 2016-08-28 DIAGNOSIS — D320 Benign neoplasm of cerebral meninges: Secondary | ICD-10-CM | POA: Diagnosis not present

## 2016-08-28 DIAGNOSIS — Z471 Aftercare following joint replacement surgery: Secondary | ICD-10-CM | POA: Diagnosis not present

## 2016-08-28 DIAGNOSIS — I10 Essential (primary) hypertension: Secondary | ICD-10-CM | POA: Diagnosis not present

## 2016-08-28 DIAGNOSIS — E785 Hyperlipidemia, unspecified: Secondary | ICD-10-CM | POA: Diagnosis not present

## 2016-08-28 DIAGNOSIS — E119 Type 2 diabetes mellitus without complications: Secondary | ICD-10-CM | POA: Diagnosis not present

## 2016-08-28 DIAGNOSIS — Z96652 Presence of left artificial knee joint: Secondary | ICD-10-CM | POA: Diagnosis not present

## 2016-08-31 HISTORY — PX: JOINT REPLACEMENT: SHX530

## 2016-09-01 DIAGNOSIS — Z96652 Presence of left artificial knee joint: Secondary | ICD-10-CM | POA: Diagnosis not present

## 2016-09-01 DIAGNOSIS — D320 Benign neoplasm of cerebral meninges: Secondary | ICD-10-CM | POA: Diagnosis not present

## 2016-09-01 DIAGNOSIS — I10 Essential (primary) hypertension: Secondary | ICD-10-CM | POA: Diagnosis not present

## 2016-09-01 DIAGNOSIS — H409 Unspecified glaucoma: Secondary | ICD-10-CM | POA: Diagnosis not present

## 2016-09-01 DIAGNOSIS — K219 Gastro-esophageal reflux disease without esophagitis: Secondary | ICD-10-CM | POA: Diagnosis not present

## 2016-09-01 DIAGNOSIS — Z471 Aftercare following joint replacement surgery: Secondary | ICD-10-CM | POA: Diagnosis not present

## 2016-09-01 DIAGNOSIS — E785 Hyperlipidemia, unspecified: Secondary | ICD-10-CM | POA: Diagnosis not present

## 2016-09-01 DIAGNOSIS — E119 Type 2 diabetes mellitus without complications: Secondary | ICD-10-CM | POA: Diagnosis not present

## 2016-09-01 DIAGNOSIS — M1990 Unspecified osteoarthritis, unspecified site: Secondary | ICD-10-CM | POA: Diagnosis not present

## 2016-09-03 DIAGNOSIS — Z96652 Presence of left artificial knee joint: Secondary | ICD-10-CM | POA: Diagnosis not present

## 2016-09-03 DIAGNOSIS — H409 Unspecified glaucoma: Secondary | ICD-10-CM | POA: Diagnosis not present

## 2016-09-03 DIAGNOSIS — K219 Gastro-esophageal reflux disease without esophagitis: Secondary | ICD-10-CM | POA: Diagnosis not present

## 2016-09-03 DIAGNOSIS — E785 Hyperlipidemia, unspecified: Secondary | ICD-10-CM | POA: Diagnosis not present

## 2016-09-03 DIAGNOSIS — E119 Type 2 diabetes mellitus without complications: Secondary | ICD-10-CM | POA: Diagnosis not present

## 2016-09-03 DIAGNOSIS — D320 Benign neoplasm of cerebral meninges: Secondary | ICD-10-CM | POA: Diagnosis not present

## 2016-09-03 DIAGNOSIS — M1990 Unspecified osteoarthritis, unspecified site: Secondary | ICD-10-CM | POA: Diagnosis not present

## 2016-09-03 DIAGNOSIS — I10 Essential (primary) hypertension: Secondary | ICD-10-CM | POA: Diagnosis not present

## 2016-09-03 DIAGNOSIS — Z471 Aftercare following joint replacement surgery: Secondary | ICD-10-CM | POA: Diagnosis not present

## 2016-09-05 DIAGNOSIS — K219 Gastro-esophageal reflux disease without esophagitis: Secondary | ICD-10-CM | POA: Diagnosis not present

## 2016-09-05 DIAGNOSIS — H409 Unspecified glaucoma: Secondary | ICD-10-CM | POA: Diagnosis not present

## 2016-09-05 DIAGNOSIS — M1990 Unspecified osteoarthritis, unspecified site: Secondary | ICD-10-CM | POA: Diagnosis not present

## 2016-09-05 DIAGNOSIS — D320 Benign neoplasm of cerebral meninges: Secondary | ICD-10-CM | POA: Diagnosis not present

## 2016-09-05 DIAGNOSIS — E119 Type 2 diabetes mellitus without complications: Secondary | ICD-10-CM | POA: Diagnosis not present

## 2016-09-05 DIAGNOSIS — Z96652 Presence of left artificial knee joint: Secondary | ICD-10-CM | POA: Diagnosis not present

## 2016-09-05 DIAGNOSIS — I10 Essential (primary) hypertension: Secondary | ICD-10-CM | POA: Diagnosis not present

## 2016-09-05 DIAGNOSIS — E785 Hyperlipidemia, unspecified: Secondary | ICD-10-CM | POA: Diagnosis not present

## 2016-09-05 DIAGNOSIS — Z471 Aftercare following joint replacement surgery: Secondary | ICD-10-CM | POA: Diagnosis not present

## 2016-09-09 DIAGNOSIS — H409 Unspecified glaucoma: Secondary | ICD-10-CM | POA: Diagnosis not present

## 2016-09-09 DIAGNOSIS — E119 Type 2 diabetes mellitus without complications: Secondary | ICD-10-CM | POA: Diagnosis not present

## 2016-09-09 DIAGNOSIS — Z96652 Presence of left artificial knee joint: Secondary | ICD-10-CM | POA: Diagnosis not present

## 2016-09-09 DIAGNOSIS — D320 Benign neoplasm of cerebral meninges: Secondary | ICD-10-CM | POA: Diagnosis not present

## 2016-09-09 DIAGNOSIS — M1990 Unspecified osteoarthritis, unspecified site: Secondary | ICD-10-CM | POA: Diagnosis not present

## 2016-09-09 DIAGNOSIS — E785 Hyperlipidemia, unspecified: Secondary | ICD-10-CM | POA: Diagnosis not present

## 2016-09-09 DIAGNOSIS — I10 Essential (primary) hypertension: Secondary | ICD-10-CM | POA: Diagnosis not present

## 2016-09-09 DIAGNOSIS — Z471 Aftercare following joint replacement surgery: Secondary | ICD-10-CM | POA: Diagnosis not present

## 2016-09-09 DIAGNOSIS — K219 Gastro-esophageal reflux disease without esophagitis: Secondary | ICD-10-CM | POA: Diagnosis not present

## 2016-09-10 DIAGNOSIS — Z96652 Presence of left artificial knee joint: Secondary | ICD-10-CM | POA: Diagnosis not present

## 2016-09-10 DIAGNOSIS — K219 Gastro-esophageal reflux disease without esophagitis: Secondary | ICD-10-CM | POA: Diagnosis not present

## 2016-09-10 DIAGNOSIS — I10 Essential (primary) hypertension: Secondary | ICD-10-CM | POA: Diagnosis not present

## 2016-09-10 DIAGNOSIS — H409 Unspecified glaucoma: Secondary | ICD-10-CM | POA: Diagnosis not present

## 2016-09-10 DIAGNOSIS — M1990 Unspecified osteoarthritis, unspecified site: Secondary | ICD-10-CM | POA: Diagnosis not present

## 2016-09-10 DIAGNOSIS — D320 Benign neoplasm of cerebral meninges: Secondary | ICD-10-CM | POA: Diagnosis not present

## 2016-09-10 DIAGNOSIS — E119 Type 2 diabetes mellitus without complications: Secondary | ICD-10-CM | POA: Diagnosis not present

## 2016-09-10 DIAGNOSIS — E785 Hyperlipidemia, unspecified: Secondary | ICD-10-CM | POA: Diagnosis not present

## 2016-09-10 DIAGNOSIS — Z471 Aftercare following joint replacement surgery: Secondary | ICD-10-CM | POA: Diagnosis not present

## 2016-09-12 DIAGNOSIS — H409 Unspecified glaucoma: Secondary | ICD-10-CM | POA: Diagnosis not present

## 2016-09-12 DIAGNOSIS — E119 Type 2 diabetes mellitus without complications: Secondary | ICD-10-CM | POA: Diagnosis not present

## 2016-09-12 DIAGNOSIS — Z96652 Presence of left artificial knee joint: Secondary | ICD-10-CM | POA: Diagnosis not present

## 2016-09-12 DIAGNOSIS — Z471 Aftercare following joint replacement surgery: Secondary | ICD-10-CM | POA: Diagnosis not present

## 2016-09-12 DIAGNOSIS — I10 Essential (primary) hypertension: Secondary | ICD-10-CM | POA: Diagnosis not present

## 2016-09-12 DIAGNOSIS — K219 Gastro-esophageal reflux disease without esophagitis: Secondary | ICD-10-CM | POA: Diagnosis not present

## 2016-09-12 DIAGNOSIS — D320 Benign neoplasm of cerebral meninges: Secondary | ICD-10-CM | POA: Diagnosis not present

## 2016-09-12 DIAGNOSIS — E785 Hyperlipidemia, unspecified: Secondary | ICD-10-CM | POA: Diagnosis not present

## 2016-09-12 DIAGNOSIS — M1990 Unspecified osteoarthritis, unspecified site: Secondary | ICD-10-CM | POA: Diagnosis not present

## 2016-09-18 DIAGNOSIS — Z96652 Presence of left artificial knee joint: Secondary | ICD-10-CM | POA: Diagnosis not present

## 2016-09-18 DIAGNOSIS — M1990 Unspecified osteoarthritis, unspecified site: Secondary | ICD-10-CM | POA: Diagnosis not present

## 2016-09-18 DIAGNOSIS — K219 Gastro-esophageal reflux disease without esophagitis: Secondary | ICD-10-CM | POA: Diagnosis not present

## 2016-09-18 DIAGNOSIS — D320 Benign neoplasm of cerebral meninges: Secondary | ICD-10-CM | POA: Diagnosis not present

## 2016-09-18 DIAGNOSIS — Z471 Aftercare following joint replacement surgery: Secondary | ICD-10-CM | POA: Diagnosis not present

## 2016-09-18 DIAGNOSIS — H409 Unspecified glaucoma: Secondary | ICD-10-CM | POA: Diagnosis not present

## 2016-09-18 DIAGNOSIS — E119 Type 2 diabetes mellitus without complications: Secondary | ICD-10-CM | POA: Diagnosis not present

## 2016-09-18 DIAGNOSIS — I10 Essential (primary) hypertension: Secondary | ICD-10-CM | POA: Diagnosis not present

## 2016-09-18 DIAGNOSIS — E785 Hyperlipidemia, unspecified: Secondary | ICD-10-CM | POA: Diagnosis not present

## 2016-09-20 DIAGNOSIS — I10 Essential (primary) hypertension: Secondary | ICD-10-CM | POA: Diagnosis not present

## 2016-09-20 DIAGNOSIS — E785 Hyperlipidemia, unspecified: Secondary | ICD-10-CM | POA: Diagnosis not present

## 2016-09-20 DIAGNOSIS — M1990 Unspecified osteoarthritis, unspecified site: Secondary | ICD-10-CM | POA: Diagnosis not present

## 2016-09-20 DIAGNOSIS — D320 Benign neoplasm of cerebral meninges: Secondary | ICD-10-CM | POA: Diagnosis not present

## 2016-09-20 DIAGNOSIS — Z96652 Presence of left artificial knee joint: Secondary | ICD-10-CM | POA: Diagnosis not present

## 2016-09-20 DIAGNOSIS — Z471 Aftercare following joint replacement surgery: Secondary | ICD-10-CM | POA: Diagnosis not present

## 2016-09-20 DIAGNOSIS — K219 Gastro-esophageal reflux disease without esophagitis: Secondary | ICD-10-CM | POA: Diagnosis not present

## 2016-09-20 DIAGNOSIS — E119 Type 2 diabetes mellitus without complications: Secondary | ICD-10-CM | POA: Diagnosis not present

## 2016-09-20 DIAGNOSIS — H409 Unspecified glaucoma: Secondary | ICD-10-CM | POA: Diagnosis not present

## 2016-09-22 ENCOUNTER — Telehealth: Payer: Self-pay | Admitting: Nurse Practitioner

## 2016-09-22 DIAGNOSIS — M1712 Unilateral primary osteoarthritis, left knee: Secondary | ICD-10-CM

## 2016-09-22 DIAGNOSIS — Z96652 Presence of left artificial knee joint: Secondary | ICD-10-CM | POA: Insufficient documentation

## 2016-09-22 NOTE — Telephone Encounter (Signed)
Pt recently had knee surgery and hospital wanted him to have a bedside cammode.  He got one but insurance will not pay without a prescription from primary care.  The call back number is [phone]

## 2016-09-22 NOTE — Telephone Encounter (Signed)
Placed order in Epic for Bedside Commode, printed rx and handwritten rx, to be either picked up by patient and taken to med supply store or pharmacy, or we can fax if he prefers. Please contact patient to clarify.  [name], [facility] 09/22/2016, 5:40 PM

## 2016-09-23 DIAGNOSIS — Z96652 Presence of left artificial knee joint: Secondary | ICD-10-CM | POA: Diagnosis not present

## 2016-09-23 NOTE — Telephone Encounter (Signed)
The pt daughter was notified and she will come and pick up the prescription to fax to the insurance company.

## 2016-09-24 DIAGNOSIS — H409 Unspecified glaucoma: Secondary | ICD-10-CM | POA: Diagnosis not present

## 2016-09-24 DIAGNOSIS — M1990 Unspecified osteoarthritis, unspecified site: Secondary | ICD-10-CM | POA: Diagnosis not present

## 2016-09-24 DIAGNOSIS — Z96652 Presence of left artificial knee joint: Secondary | ICD-10-CM | POA: Diagnosis not present

## 2016-09-24 DIAGNOSIS — Z471 Aftercare following joint replacement surgery: Secondary | ICD-10-CM | POA: Diagnosis not present

## 2016-09-24 DIAGNOSIS — E785 Hyperlipidemia, unspecified: Secondary | ICD-10-CM | POA: Diagnosis not present

## 2016-09-24 DIAGNOSIS — K219 Gastro-esophageal reflux disease without esophagitis: Secondary | ICD-10-CM | POA: Diagnosis not present

## 2016-09-24 DIAGNOSIS — D320 Benign neoplasm of cerebral meninges: Secondary | ICD-10-CM | POA: Diagnosis not present

## 2016-09-24 DIAGNOSIS — E119 Type 2 diabetes mellitus without complications: Secondary | ICD-10-CM | POA: Diagnosis not present

## 2016-09-24 DIAGNOSIS — I10 Essential (primary) hypertension: Secondary | ICD-10-CM | POA: Diagnosis not present

## 2016-09-26 ENCOUNTER — Other Ambulatory Visit: Payer: Self-pay | Admitting: Family Medicine

## 2016-09-26 ENCOUNTER — Telehealth: Payer: Self-pay | Admitting: Family Medicine

## 2016-09-26 DIAGNOSIS — E119 Type 2 diabetes mellitus without complications: Secondary | ICD-10-CM | POA: Diagnosis not present

## 2016-09-26 DIAGNOSIS — H409 Unspecified glaucoma: Secondary | ICD-10-CM | POA: Diagnosis not present

## 2016-09-26 DIAGNOSIS — E785 Hyperlipidemia, unspecified: Secondary | ICD-10-CM

## 2016-09-26 DIAGNOSIS — M1990 Unspecified osteoarthritis, unspecified site: Secondary | ICD-10-CM | POA: Diagnosis not present

## 2016-09-26 DIAGNOSIS — I10 Essential (primary) hypertension: Secondary | ICD-10-CM | POA: Diagnosis not present

## 2016-09-26 DIAGNOSIS — D320 Benign neoplasm of cerebral meninges: Secondary | ICD-10-CM | POA: Diagnosis not present

## 2016-09-26 DIAGNOSIS — K219 Gastro-esophageal reflux disease without esophagitis: Secondary | ICD-10-CM | POA: Diagnosis not present

## 2016-09-26 DIAGNOSIS — Z96652 Presence of left artificial knee joint: Secondary | ICD-10-CM | POA: Diagnosis not present

## 2016-09-26 DIAGNOSIS — Z471 Aftercare following joint replacement surgery: Secondary | ICD-10-CM | POA: Diagnosis not present

## 2016-09-26 MED ORDER — ATORVASTATIN CALCIUM 20 MG PO TABS: 20.0000 mg | ORAL_TABLET | Freq: Every day | ORAL | 0 refills | Status: DC

## 2016-09-26 NOTE — Telephone Encounter (Signed)
Rx send

## 2016-09-26 NOTE — Telephone Encounter (Signed)
Pt. Called requesting a refill on atorvastatin  [facility]

## 2016-09-29 DIAGNOSIS — E119 Type 2 diabetes mellitus without complications: Secondary | ICD-10-CM | POA: Diagnosis not present

## 2016-09-29 DIAGNOSIS — K219 Gastro-esophageal reflux disease without esophagitis: Secondary | ICD-10-CM | POA: Diagnosis not present

## 2016-09-29 DIAGNOSIS — D320 Benign neoplasm of cerebral meninges: Secondary | ICD-10-CM | POA: Diagnosis not present

## 2016-09-29 DIAGNOSIS — Z471 Aftercare following joint replacement surgery: Secondary | ICD-10-CM | POA: Diagnosis not present

## 2016-09-29 DIAGNOSIS — I10 Essential (primary) hypertension: Secondary | ICD-10-CM | POA: Diagnosis not present

## 2016-09-29 DIAGNOSIS — H409 Unspecified glaucoma: Secondary | ICD-10-CM | POA: Diagnosis not present

## 2016-09-29 DIAGNOSIS — M1990 Unspecified osteoarthritis, unspecified site: Secondary | ICD-10-CM | POA: Diagnosis not present

## 2016-09-29 DIAGNOSIS — E785 Hyperlipidemia, unspecified: Secondary | ICD-10-CM | POA: Diagnosis not present

## 2016-09-29 DIAGNOSIS — Z96652 Presence of left artificial knee joint: Secondary | ICD-10-CM | POA: Diagnosis not present

## 2016-09-30 DIAGNOSIS — H401132 Primary open-angle glaucoma, bilateral, moderate stage: Secondary | ICD-10-CM | POA: Diagnosis not present

## 2016-10-01 DIAGNOSIS — I10 Essential (primary) hypertension: Secondary | ICD-10-CM | POA: Diagnosis not present

## 2016-10-01 DIAGNOSIS — K219 Gastro-esophageal reflux disease without esophagitis: Secondary | ICD-10-CM | POA: Diagnosis not present

## 2016-10-01 DIAGNOSIS — E785 Hyperlipidemia, unspecified: Secondary | ICD-10-CM | POA: Diagnosis not present

## 2016-10-01 DIAGNOSIS — H409 Unspecified glaucoma: Secondary | ICD-10-CM | POA: Diagnosis not present

## 2016-10-01 DIAGNOSIS — D320 Benign neoplasm of cerebral meninges: Secondary | ICD-10-CM | POA: Diagnosis not present

## 2016-10-01 DIAGNOSIS — M1990 Unspecified osteoarthritis, unspecified site: Secondary | ICD-10-CM | POA: Diagnosis not present

## 2016-10-01 DIAGNOSIS — E119 Type 2 diabetes mellitus without complications: Secondary | ICD-10-CM | POA: Diagnosis not present

## 2016-10-01 DIAGNOSIS — Z471 Aftercare following joint replacement surgery: Secondary | ICD-10-CM | POA: Diagnosis not present

## 2016-10-01 DIAGNOSIS — Z96652 Presence of left artificial knee joint: Secondary | ICD-10-CM | POA: Diagnosis not present

## 2016-10-03 DIAGNOSIS — M25562 Pain in left knee: Secondary | ICD-10-CM | POA: Diagnosis not present

## 2016-10-03 DIAGNOSIS — M25662 Stiffness of left knee, not elsewhere classified: Secondary | ICD-10-CM | POA: Diagnosis not present

## 2016-10-03 DIAGNOSIS — Z96652 Presence of left artificial knee joint: Secondary | ICD-10-CM | POA: Diagnosis not present

## 2016-10-03 DIAGNOSIS — M25669 Stiffness of unspecified knee, not elsewhere classified: Secondary | ICD-10-CM | POA: Insufficient documentation

## 2016-10-03 DIAGNOSIS — Z471 Aftercare following joint replacement surgery: Secondary | ICD-10-CM | POA: Diagnosis not present

## 2016-10-03 DIAGNOSIS — G8929 Other chronic pain: Secondary | ICD-10-CM | POA: Diagnosis not present

## 2016-10-09 ENCOUNTER — Ambulatory Visit (INDEPENDENT_AMBULATORY_CARE_PROVIDER_SITE_OTHER): Payer: Medicare HMO | Admitting: Family Medicine

## 2016-10-09 ENCOUNTER — Encounter: Payer: Self-pay | Admitting: Family Medicine

## 2016-10-09 VITALS — Temp 98.5°F

## 2016-10-09 DIAGNOSIS — Z96652 Presence of left artificial knee joint: Secondary | ICD-10-CM

## 2016-10-09 DIAGNOSIS — H8113 Benign paroxysmal vertigo, bilateral: Secondary | ICD-10-CM | POA: Insufficient documentation

## 2016-10-09 DIAGNOSIS — D420 Neoplasm of uncertain behavior of cerebral meninges: Secondary | ICD-10-CM

## 2016-10-09 DIAGNOSIS — H8112 Benign paroxysmal vertigo, left ear: Secondary | ICD-10-CM | POA: Diagnosis not present

## 2016-10-09 MED ORDER — MECLIZINE HCL 25 MG PO TABS: 12.5000 mg | ORAL_TABLET | Freq: Three times a day (TID) | ORAL | 1 refills | Status: DC | PRN

## 2016-10-09 NOTE — Patient Instructions (Addendum)
Thank you for coming to the clinic today.  1. You have symptoms of Vertigo (Benign Paroxysmal Positional Vertigo) LEFT SIDE  - This is commonly caused by inner ear fluid imbalance, sometimes can be worsened by allergies and sinus symptoms, otherwise it can occur randomly sometimes and we may never discover the exact cause. - To treat this, try the Epley Manuever (see diagrams/instructions below) at home up to 3 times a day for 1-2 weeks or until symptoms resolve - You may take Meclizine as needed up to 3 times a day for dizziness, this will not cure symptoms but may help. Caution may make you drowsy.  Please discuss this with your Therapist on Monday at outpatient therapy for Knee, maybe they can help you with Vestibular Rehab as well  Also discuss this with your Neurosurgeon on August 13.  If you develop significant worsening episode with vertigo that does not improve and you get severe headache, loss of vision, arm or leg weakness, slurred speech, or other concerning symptoms please seek immediate medical attention at Emergency Department.  Please schedule a follow-up appointment with Dr Parks Ranger within 4 weeks if Vertigo not improving, and will consider Referral to Vestibular Rehab  See the next page for images describing the Epley Manuever.     ----------------------------------------------------------------------------------------------------------------------        Please schedule a Follow-up Appointment to: Return in about 2 weeks (around 10/23/2016), or if symptoms worsen or fail to improve, for Vertigo, BPPV L.  If you have any other questions or concerns, please feel free to call the clinic or send a message through [place]. You may also schedule an earlier appointment if necessary.  Additionally, you may be receiving a survey about your experience at our clinic within a few days to 1 week by e-mail or mail. We value your feedback.  [name], DO [place]

## 2016-10-09 NOTE — Assessment & Plan Note (Addendum)
Does not seem consistent that this would contribute to current vertigo symptoms Follow-up as scheduled with [facility] 10/13/16 as planned, can discuss current vertigo with them, defer other imaging or management at this time  Strict return criteria given if significant worsening

## 2016-10-09 NOTE — Assessment & Plan Note (Signed)
Stable, gradual improving, now >6 weeks out Follow up with outpatient PT as planned and Ortho Caution with falls with vertigo, see A&P, try to treat with epley and meclizine and PT

## 2016-10-09 NOTE — Progress Notes (Signed)
Subjective:    Patient ID: Ross Riley, male    DOB: 10-Aug-1950, 66 y.o.   MRN: [id number]  Ross Riley is a 66 y.o. male presenting on 10/09/2016 for Dizziness (onset 2 days ago as per pt when he move his head gets dizzy, pt used meclizine 12.5 mg was given by ophthalmologist dr. Donley Redder obtw pt had knee surgery recently 08/20/16)  Patient presents for a same day appointment. History obtained in English by patient, he declines Register, and had no problems with medical history during interview today.  HPI   VERTIGO / Episodes of dizziness / History of Vertigo and Atypical Intracranial meningioma - Reports recurrent problem now with acute onset dizziness started 2-3 days ago, after waking up, he endorses room spinning sensation with vertigo, similar to prior hospitalization in 06/2016, episodes multiple times a day lasting minutes or longer, provoked by moving head in any direction. Persistent without improvement or worsening. - Tried prior rx Meclizine 12.5mg  every 6 hours as needed with relief, now out of med, and request refill - Admits some nausea but no vomiting only with episodes. Tolerating PO well. - Also admits frontal headache, dull, not worsening - Not taking any Tylenol or pain medicine - He is recently s/p L TKR and was doing [facility], now next week Monday going to outpatient PT, limiting his ambulation at this time, also has chronic L ankle dystonia and has AFO - Regarding recent history of dx Atyical Intracranial Meningioma, he was scheduled for [facility] follow-up after determined no intervention needed upon discharge from hospital, now to see [facility], scheduled for October 13, 2016 - Denies any new focal weakness, numbness, tingling, loss of vision, vomiting, fevers/chills, falls   Social History  Substance Use Topics  . Smoking status: Former Smoker    Packs/day: 1.00    Years: 10.00    Types: Cigarettes    Quit date:  03/04/1991  . Smokeless tobacco: Never Used  . Alcohol use 0.0 oz/week     Comment: occasional    Review of Systems Per HPI unless specifically indicated above     Objective:     Orthostatic Vitals Lying down - BP 133/69, HR 57 Seated - BP 108/69, HR 57 Standing - BP 113/65, HR 63  Temp 98.5 F (36.9 C) (Oral)   Wt Readings from Last 3 Encounters:  06/05/16 161 lb (73 kg)  05/20/16 158 lb (71.7 kg)  02/11/16 157 lb (71.2 kg)    Physical Exam  Constitutional: He is oriented to person, place, and time. He appears well-developed and well-nourished. No distress.  Well-appearing, comfortable, cooperative  HENT:  Head: Normocephalic and atraumatic.  Mouth/Throat: Oropharynx is clear and moist.  Frontal / maxillary sinuses non-tender. Nares patent without purulence or edema. Bilateral TMs clear without erythema, effusion or bulging. Oropharynx clear without erythema, exudates, edema or asymmetry.  Dix-Hallpike maneuver performed LEFT positive for reproduced vertigo symptoms and some rotary nystagmus. Right negative.  Eyes: Conjunctivae are normal. Right eye exhibits no discharge. Left eye exhibits no discharge.  Cardiovascular: Normal rate, regular rhythm, normal heart sounds and intact distal pulses.   No murmur heard. Pulmonary/Chest: Effort normal and breath sounds normal. No respiratory distress. He has no wheezes. He has no rales.  Musculoskeletal: Normal range of motion. He exhibits no edema.  S/p L TKR with well healed incision, some localized L knee edema  Neurological: He is alert and oriented to person, place, and time. No cranial  nerve deficit. He exhibits abnormal muscle tone (chronic L ankle dystonia unchanged). Coordination normal.  Skin: Skin is warm and dry. No rash noted. He is not diaphoretic. No erythema.  Psychiatric: He has a normal mood and affect. His behavior is normal.  Well groomed, good eye contact, normal speech and thoughts  Nursing note and vitals  reviewed.    Results for orders placed or performed during the hospital encounter of 22/97/98  Basic metabolic panel  Result Value Ref Range   Sodium 139 135 - 145 mmol/L   Potassium 4.0 3.5 - 5.1 mmol/L   Chloride 103 101 - 111 mmol/L   CO2 28 22 - 32 mmol/L   Glucose, Bld 108 (H) 65 - 99 mg/dL   BUN 13 6 - 20 mg/dL   Creatinine, Ser 0.93 0.61 - 1.24 mg/dL   Calcium 9.3 8.9 - 10.3 mg/dL   GFR calc non Af Amer >60 >60 mL/min   GFR calc Af Amer >60 >60 mL/min   Anion gap 8 5 - 15  CBC  Result Value Ref Range   WBC 6.6 3.8 - 10.6 K/uL   RBC 5.22 4.40 - 5.90 MIL/uL   Hemoglobin 15.6 13.0 - 18.0 g/dL   HCT 45.6 40.0 - 52.0 %   MCV 87.4 80.0 - 100.0 fL   MCH 29.9 26.0 - 34.0 pg   MCHC 34.2 32.0 - 36.0 g/dL   RDW 13.1 11.5 - 14.5 %   Platelets 201 150 - 440 K/uL  Urinalysis, Complete w Microscopic  Result Value Ref Range   Color, Urine YELLOW (A) YELLOW   APPearance CLEAR (A) CLEAR   Specific Gravity, Urine 1.027 1.005 - 1.030   pH 6.0 5.0 - 8.0   Glucose, UA NEGATIVE NEGATIVE mg/dL   Hgb urine dipstick SMALL (A) NEGATIVE   Bilirubin Urine NEGATIVE NEGATIVE   Ketones, ur NEGATIVE NEGATIVE mg/dL   Protein, ur NEGATIVE NEGATIVE mg/dL   Nitrite NEGATIVE NEGATIVE   Leukocytes, UA NEGATIVE NEGATIVE   RBC / HPF 0-5 0 - 5 RBC/hpf   WBC, UA 0-5 0 - 5 WBC/hpf   Bacteria, UA NONE SEEN NONE SEEN   Squamous Epithelial / LPF NONE SEEN NONE SEEN   Mucous PRESENT   APTT  Result Value Ref Range   aPTT 24 24 - 36 seconds  Protime-INR  Result Value Ref Range   Prothrombin Time 12.9 11.4 - 15.2 seconds   INR 0.97       Assessment & Plan:   Problem List Items Addressed This Visit    S/P TKR (total knee replacement), left    Stable, gradual improving, now >6 weeks out Follow up with outpatient PT as planned and Ortho Caution with falls with vertigo, see A&P, try to treat with epley and meclizine and PT      BPPV (benign paroxysmal positional vertigo), left - Primary    Suspected  L-BPPV by history supported by positive L Dix-Hallpike - No other significant neurological findings or focal deficits - Unlikely related to complex recent neuro history with prior atypical meningioma identified on prior MRI and hospitalization at [facility] in 06/2016  Plan: 1. Handout given with Epley maneuver TID for 1-2 weeks until resolved 2. Rx meclizine PRN for breakthrough symptoms - given 25mg  can cut in half for 12.5mg  since was helping before 3. He is scheduled for outpatient PT next Monday for knee, but he can ask them about possible help with vestibular if needed. Also advised him to discuss symptom  with Neurosurgery on 10/13/16 if unresolved 4. Return criteria, if not improved consider vestibular PT referral      Relevant Medications   meclizine (ANTIVERT) 25 MG tablet   Atypical intracranial meningioma ([place])    Does not seem consistent that this would contribute to current vertigo symptoms Follow-up as scheduled with [facility] 10/13/16 as planned, can discuss current vertigo with them, defer other imaging or management at this time  Strict return criteria given if significant worsening          Follow up plan: Return in about 2 weeks (around 10/23/2016), or if symptoms worsen or fail to improve, for Vertigo, BPPV L.  [name], [facility] 10/09/2016, 11:57 AM

## 2016-10-09 NOTE — Assessment & Plan Note (Signed)
Suspected L-BPPV by history supported by positive L Dix-Hallpike - No other significant neurological findings or focal deficits - Unlikely related to complex recent neuro history with prior atypical meningioma identified on prior MRI and hospitalization at [facility] in 06/2016  Plan: 1. Handout given with Epley maneuver TID for 1-2 weeks until resolved 2. Rx meclizine PRN for breakthrough symptoms - given 25mg  can cut in half for 12.5mg  since was helping before 3. He is scheduled for outpatient PT next Monday for knee, but he can ask them about possible help with vestibular if needed. Also advised him to discuss symptom with Neurosurgery on 10/13/16 if unresolved 4. Return criteria, if not improved consider vestibular PT referral

## 2016-10-13 DIAGNOSIS — E119 Type 2 diabetes mellitus without complications: Secondary | ICD-10-CM | POA: Diagnosis not present

## 2016-10-13 DIAGNOSIS — R42 Dizziness and giddiness: Secondary | ICD-10-CM | POA: Diagnosis not present

## 2016-10-13 DIAGNOSIS — M25662 Stiffness of left knee, not elsewhere classified: Secondary | ICD-10-CM | POA: Diagnosis not present

## 2016-10-13 DIAGNOSIS — I10 Essential (primary) hypertension: Secondary | ICD-10-CM | POA: Diagnosis not present

## 2016-10-13 DIAGNOSIS — Z96652 Presence of left artificial knee joint: Secondary | ICD-10-CM | POA: Diagnosis not present

## 2016-10-13 DIAGNOSIS — G248 Other dystonia: Secondary | ICD-10-CM | POA: Diagnosis not present

## 2016-10-13 DIAGNOSIS — G252 Other specified forms of tremor: Secondary | ICD-10-CM | POA: Diagnosis not present

## 2016-10-13 DIAGNOSIS — D420 Neoplasm of uncertain behavior of cerebral meninges: Secondary | ICD-10-CM | POA: Diagnosis not present

## 2016-10-15 DIAGNOSIS — Z96652 Presence of left artificial knee joint: Secondary | ICD-10-CM | POA: Diagnosis not present

## 2016-10-15 DIAGNOSIS — M25662 Stiffness of left knee, not elsewhere classified: Secondary | ICD-10-CM | POA: Diagnosis not present

## 2016-10-16 DIAGNOSIS — M25662 Stiffness of left knee, not elsewhere classified: Secondary | ICD-10-CM | POA: Diagnosis not present

## 2016-10-16 DIAGNOSIS — Z96652 Presence of left artificial knee joint: Secondary | ICD-10-CM | POA: Diagnosis not present

## 2016-10-20 ENCOUNTER — Other Ambulatory Visit: Payer: Self-pay

## 2016-10-20 DIAGNOSIS — Z96652 Presence of left artificial knee joint: Secondary | ICD-10-CM | POA: Diagnosis not present

## 2016-10-20 DIAGNOSIS — M25662 Stiffness of left knee, not elsewhere classified: Secondary | ICD-10-CM | POA: Diagnosis not present

## 2016-10-23 DIAGNOSIS — M25662 Stiffness of left knee, not elsewhere classified: Secondary | ICD-10-CM | POA: Diagnosis not present

## 2016-10-23 DIAGNOSIS — Z96652 Presence of left artificial knee joint: Secondary | ICD-10-CM | POA: Diagnosis not present

## 2016-10-24 DIAGNOSIS — Z96652 Presence of left artificial knee joint: Secondary | ICD-10-CM | POA: Diagnosis not present

## 2016-10-24 DIAGNOSIS — M25662 Stiffness of left knee, not elsewhere classified: Secondary | ICD-10-CM | POA: Diagnosis not present

## 2016-10-27 DIAGNOSIS — Z96652 Presence of left artificial knee joint: Secondary | ICD-10-CM | POA: Diagnosis not present

## 2016-10-27 DIAGNOSIS — M25662 Stiffness of left knee, not elsewhere classified: Secondary | ICD-10-CM | POA: Diagnosis not present

## 2016-10-29 DIAGNOSIS — M25662 Stiffness of left knee, not elsewhere classified: Secondary | ICD-10-CM | POA: Diagnosis not present

## 2016-10-29 DIAGNOSIS — Z96652 Presence of left artificial knee joint: Secondary | ICD-10-CM | POA: Diagnosis not present

## 2016-10-31 DIAGNOSIS — M25662 Stiffness of left knee, not elsewhere classified: Secondary | ICD-10-CM | POA: Diagnosis not present

## 2016-10-31 DIAGNOSIS — M25561 Pain in right knee: Secondary | ICD-10-CM | POA: Diagnosis not present

## 2016-10-31 DIAGNOSIS — G8929 Other chronic pain: Secondary | ICD-10-CM | POA: Diagnosis not present

## 2016-10-31 DIAGNOSIS — Z96652 Presence of left artificial knee joint: Secondary | ICD-10-CM | POA: Diagnosis not present

## 2016-11-05 DIAGNOSIS — Z471 Aftercare following joint replacement surgery: Secondary | ICD-10-CM | POA: Diagnosis not present

## 2016-11-05 DIAGNOSIS — Z7982 Long term (current) use of aspirin: Secondary | ICD-10-CM | POA: Diagnosis not present

## 2016-11-05 DIAGNOSIS — I10 Essential (primary) hypertension: Secondary | ICD-10-CM | POA: Diagnosis not present

## 2016-11-05 DIAGNOSIS — M25662 Stiffness of left knee, not elsewhere classified: Secondary | ICD-10-CM | POA: Diagnosis not present

## 2016-11-05 DIAGNOSIS — T8489XA Other specified complication of internal orthopedic prosthetic devices, implants and grafts, initial encounter: Secondary | ICD-10-CM | POA: Diagnosis not present

## 2016-11-05 DIAGNOSIS — E119 Type 2 diabetes mellitus without complications: Secondary | ICD-10-CM | POA: Diagnosis not present

## 2016-11-05 DIAGNOSIS — Z7984 Long term (current) use of oral hypoglycemic drugs: Secondary | ICD-10-CM | POA: Diagnosis not present

## 2016-11-05 DIAGNOSIS — Z87891 Personal history of nicotine dependence: Secondary | ICD-10-CM | POA: Diagnosis not present

## 2016-11-05 DIAGNOSIS — Z96632 Presence of left artificial wrist joint: Secondary | ICD-10-CM | POA: Diagnosis not present

## 2016-11-05 DIAGNOSIS — K219 Gastro-esophageal reflux disease without esophagitis: Secondary | ICD-10-CM | POA: Diagnosis not present

## 2016-11-06 DIAGNOSIS — Z96652 Presence of left artificial knee joint: Secondary | ICD-10-CM | POA: Diagnosis not present

## 2016-11-06 DIAGNOSIS — M25662 Stiffness of left knee, not elsewhere classified: Secondary | ICD-10-CM | POA: Diagnosis not present

## 2016-11-07 DIAGNOSIS — Z96652 Presence of left artificial knee joint: Secondary | ICD-10-CM | POA: Diagnosis not present

## 2016-11-07 DIAGNOSIS — M25662 Stiffness of left knee, not elsewhere classified: Secondary | ICD-10-CM | POA: Diagnosis not present

## 2016-11-10 DIAGNOSIS — M25662 Stiffness of left knee, not elsewhere classified: Secondary | ICD-10-CM | POA: Diagnosis not present

## 2016-11-10 DIAGNOSIS — Z96652 Presence of left artificial knee joint: Secondary | ICD-10-CM | POA: Diagnosis not present

## 2016-11-12 ENCOUNTER — Ambulatory Visit: Payer: Medicare HMO | Admitting: Family Medicine

## 2016-11-12 DIAGNOSIS — Z96652 Presence of left artificial knee joint: Secondary | ICD-10-CM | POA: Diagnosis not present

## 2016-11-12 DIAGNOSIS — M25662 Stiffness of left knee, not elsewhere classified: Secondary | ICD-10-CM | POA: Diagnosis not present

## 2016-11-17 DIAGNOSIS — M25662 Stiffness of left knee, not elsewhere classified: Secondary | ICD-10-CM | POA: Diagnosis not present

## 2016-11-17 DIAGNOSIS — Z96652 Presence of left artificial knee joint: Secondary | ICD-10-CM | POA: Diagnosis not present

## 2016-11-18 DIAGNOSIS — H401132 Primary open-angle glaucoma, bilateral, moderate stage: Secondary | ICD-10-CM | POA: Diagnosis not present

## 2016-11-19 DIAGNOSIS — M25662 Stiffness of left knee, not elsewhere classified: Secondary | ICD-10-CM | POA: Diagnosis not present

## 2016-11-19 DIAGNOSIS — Z96652 Presence of left artificial knee joint: Secondary | ICD-10-CM | POA: Diagnosis not present

## 2016-11-21 DIAGNOSIS — M25662 Stiffness of left knee, not elsewhere classified: Secondary | ICD-10-CM | POA: Diagnosis not present

## 2016-11-21 DIAGNOSIS — Z96652 Presence of left artificial knee joint: Secondary | ICD-10-CM | POA: Diagnosis not present

## 2016-11-21 DIAGNOSIS — M216X2 Other acquired deformities of left foot: Secondary | ICD-10-CM | POA: Diagnosis not present

## 2016-11-21 DIAGNOSIS — M216X9 Other acquired deformities of unspecified foot: Secondary | ICD-10-CM | POA: Diagnosis not present

## 2016-11-21 DIAGNOSIS — M25372 Other instability, left ankle: Secondary | ICD-10-CM | POA: Diagnosis not present

## 2016-11-21 DIAGNOSIS — M25373 Other instability, unspecified ankle: Secondary | ICD-10-CM | POA: Diagnosis not present

## 2016-11-24 ENCOUNTER — Encounter: Payer: Self-pay | Admitting: Family Medicine

## 2016-11-24 ENCOUNTER — Ambulatory Visit (INDEPENDENT_AMBULATORY_CARE_PROVIDER_SITE_OTHER): Payer: Medicare HMO | Admitting: Family Medicine

## 2016-11-24 VITALS — BP 122/71 | HR 49 | Temp 98.5°F | Resp 16 | Ht 66.0 in | Wt 158.6 lb

## 2016-11-24 DIAGNOSIS — M7551 Bursitis of right shoulder: Secondary | ICD-10-CM

## 2016-11-24 DIAGNOSIS — Z23 Encounter for immunization: Secondary | ICD-10-CM | POA: Diagnosis not present

## 2016-11-24 DIAGNOSIS — Z96652 Presence of left artificial knee joint: Secondary | ICD-10-CM | POA: Diagnosis not present

## 2016-11-24 DIAGNOSIS — K5909 Other constipation: Secondary | ICD-10-CM | POA: Diagnosis not present

## 2016-11-24 DIAGNOSIS — M25662 Stiffness of left knee, not elsewhere classified: Secondary | ICD-10-CM | POA: Diagnosis not present

## 2016-11-24 DIAGNOSIS — G8929 Other chronic pain: Secondary | ICD-10-CM | POA: Insufficient documentation

## 2016-11-24 DIAGNOSIS — R1032 Left lower quadrant pain: Secondary | ICD-10-CM | POA: Diagnosis not present

## 2016-11-24 DIAGNOSIS — M25511 Pain in right shoulder: Secondary | ICD-10-CM

## 2016-11-24 MED ORDER — NAPROXEN 500 MG PO TABS: 500.0000 mg | ORAL_TABLET | Freq: Two times a day (BID) | ORAL | 2 refills | Status: DC

## 2016-11-24 MED ORDER — POLYETHYLENE GLYCOL 3350 17 GM/SCOOP PO POWD: 17.0000 g | Freq: Every day | ORAL | 3 refills | Status: DC | PRN

## 2016-11-24 NOTE — Progress Notes (Signed)
Subjective:    Patient ID: Ross Riley, male    DOB: 1950/09/11, 66 y.o.   MRN: [id number]  Ross Riley is a 66 y.o. male presenting on 11/24/2016 for Abdominal Pain (Left side whenever he is eating intermitten ) and Shoulder Pain (Right side onset months getting worst from week)   HPI  CONSTIPATION / LLQ ABDOMINAL PAIN, Chronic: - Last visit with me 02/11/16, for same problem at that time, has been chronic issue for many years, episodic worsening, has had dx constipation in past, he was tested with KUB imaging unremarkable and stool culture and ova/parasite testing was negative, treated with miralax, see prior notes for background information. - Interval update with he improved at that time with miralax, but has been off of it for >6 months now or more. - Today patient reports recent worsening problem LLQ abdominal pain, now without any diarrhea. He does admit constipation, has BM every 2-3 days, often hard or dry and requires straining usually - He is due for next Colonoscopy soon, last done 2008, now he states would like to wait until 2019 since recent had knee surgery - Also taking Oxycodone PRN knee pain, which can affect him - Drinks 2-3 bottles water most days - Denies any blood in stool, rectal pain, dark stools, nausea vomiting, unintentional weight loss  Right Shoulder Pain, Bursitis / History of Osteoarthritis multiple joints, L Knee - Last visit with me 06/05/16 for same complaint, treated with referral to [place], and increased Tylenol, see prior notes for background information. - Interval update with established with [facility] Dr Rudene Christians and has had imaging and also s/p TKR surgery Left Knee - Today patient reports now seems chronic worsening problem over past several weeks R shoulder pain, difficulty lifting R shoulder up, no known injury or provoking problem, no increased overhead activities - He is taking some Tylenol, also Oxycodone from post-op - He is taking  Tizanidine 2mg  TID PRN, limited improvement - No recent shoulder X-rays. He has not done PT - He has not discussed this with [facility] - Denies any shoulder injury, swelling, redness, bruising, trauma, fever/chills  Additional history of DM2, previously well controlled. He is due for upcoming DM monitoring as scheduled in 2 months. Will return for this. He does request written rx for Diabetic shoes, had this offered to him in past but now would like to follow-up on this  Health Maintenance: - Due for Flu Shot, will receive high dose vaccine today  Depression screen [facility] 2/9 11/24/2016 06/05/2016 01/12/2015  Decreased Interest 0 0 0  Down, Depressed, Hopeless 0 0 0  PHQ - 2 Score 0 0 0    Social History  Substance Use Topics  . Smoking status: Former Smoker    Packs/day: 1.00    Years: 10.00    Types: Cigarettes    Quit date: 03/04/1991  . Smokeless tobacco: Never Used  . Alcohol use 0.0 oz/week     Comment: occasional    Review of Systems Per HPI unless specifically indicated above     Objective:    BP 122/71   Pulse (!) 49   Temp 98.5 F (36.9 C) (Oral)   Resp 16   Ht 5\' 6"  (1.676 m)   Wt 158 lb 9.6 oz (71.9 kg)   BMI 25.60 kg/m   Wt Readings from Last 3 Encounters:  11/24/16 158 lb 9.6 oz (71.9 kg)  06/05/16 161 lb (73 kg)  05/20/16 158 lb (71.7 kg)  Physical Exam  Constitutional: He is oriented to person, place, and time. He appears well-developed and well-nourished. No distress.  Well-appearing, comfortable, cooperative  HENT:  Head: Normocephalic and atraumatic.  Mouth/Throat: Oropharynx is clear and moist.  Eyes: Conjunctivae are normal. Right eye exhibits no discharge. Left eye exhibits no discharge.  Neck: Normal range of motion.  Cardiovascular: Normal rate, regular rhythm, normal heart sounds and intact distal pulses.   No murmur heard. Pulmonary/Chest: Effort normal and breath sounds normal. No respiratory distress. He has no wheezes. He has no rales.    Abdominal: Soft. Bowel sounds are normal. He exhibits no distension and no mass. There is tenderness (mild localized LLQ on deeper palpation). There is no rebound and no guarding.  RUQ non tender, normal inspiration  Musculoskeletal: He exhibits no edema.  Right Shoulder Inspection: Normal appearance bilateral symmetrical Palpation: Non-tender to palpation over anterior, lateral, or posterior shoulder  ROM: Reduced active ROM forward flexion to < 90* and limited abduction internal rotation, asymmetrical Special Testing: Rotator cuff testing negative for weakness with supraspinatus full can and empty can test, Hawkin's AC impingement POSITIVE for pain Strength: Normal strength 5/5 flex/ext, ext rot / int rot, grip, rotator cuff str testing. Neurovascular: Distally intact pulses, sensation to light touch  Neurological: He is alert and oriented to person, place, and time.  Skin: Skin is warm and dry. No rash noted. He is not diaphoretic. No erythema.  Psychiatric: His behavior is normal.  Nursing note and vitals reviewed.  Results for orders placed or performed during the hospital encounter of 85/63/14  Basic metabolic panel  Result Value Ref Range   Sodium 139 135 - 145 mmol/L   Potassium 4.0 3.5 - 5.1 mmol/L   Chloride 103 101 - 111 mmol/L   CO2 28 22 - 32 mmol/L   Glucose, Bld 108 (H) 65 - 99 mg/dL   BUN 13 6 - 20 mg/dL   Creatinine, Ser 0.93 0.61 - 1.24 mg/dL   Calcium 9.3 8.9 - 10.3 mg/dL   GFR calc non Af Amer >60 >60 mL/min   GFR calc Af Amer >60 >60 mL/min   Anion gap 8 5 - 15  CBC  Result Value Ref Range   WBC 6.6 3.8 - 10.6 K/uL   RBC 5.22 4.40 - 5.90 MIL/uL   Hemoglobin 15.6 13.0 - 18.0 g/dL   HCT 45.6 40.0 - 52.0 %   MCV 87.4 80.0 - 100.0 fL   MCH 29.9 26.0 - 34.0 pg   MCHC 34.2 32.0 - 36.0 g/dL   RDW 13.1 11.5 - 14.5 %   Platelets 201 150 - 440 K/uL  Urinalysis, Complete w Microscopic  Result Value Ref Range   Color, Urine YELLOW (A) YELLOW   APPearance CLEAR  (A) CLEAR   Specific Gravity, Urine 1.027 1.005 - 1.030   pH 6.0 5.0 - 8.0   Glucose, UA NEGATIVE NEGATIVE mg/dL   Hgb urine dipstick SMALL (A) NEGATIVE   Bilirubin Urine NEGATIVE NEGATIVE   Ketones, ur NEGATIVE NEGATIVE mg/dL   Protein, ur NEGATIVE NEGATIVE mg/dL   Nitrite NEGATIVE NEGATIVE   Leukocytes, UA NEGATIVE NEGATIVE   RBC / HPF 0-5 0 - 5 RBC/hpf   WBC, UA 0-5 0 - 5 WBC/hpf   Bacteria, UA NONE SEEN NONE SEEN   Squamous Epithelial / LPF NONE SEEN NONE SEEN   Mucus PRESENT   APTT  Result Value Ref Range   aPTT 24 24 - 36 seconds  Protime-INR  Result Value  Ref Range   Prothrombin Time 12.9 11.4 - 15.2 seconds   INR 0.97       Assessment & Plan:   Problem List Items Addressed This Visit    Abdominal pain, LLQ (left lower quadrant) - Primary    Recurrent chronic intermittent LLQ abdominal pain now with more constipation symptoms, on opiate as well - Similar to last 02/2016, had negative KUB, stool cx and ova/parasite, improved on miralax - History of international travel to home country [place] - No acute abdominal pain, benign exam  Plan: 1. Start Miralax again 1-2 cap 17-34g use daily for now, may need chronic daily dose in future 2. Increase fiber and hydration, improve diet 3. Recommended [facility] for repeat colonoscopy as planned, due 2018, he defers for now - may refer to GI if persistent worsening symptoms 4. Return criteria given if not improved      Relevant Medications   polyethylene glycol powder (GLYCOLAX/MIRALAX) powder   Chronic right shoulder pain    Consistent with subacute R-shoulder bursitis vs rotator cuff tendinopathy with some reduced active ROM but without significant evidence of muscle tear (no weakness). - No clear etiology of injury. 66 yr old patient with known underlying arthritis - No specific shoulder imaging on chart  Plan: 1. Start Naprosyn Naprosyn 500mg  twice daily (with food) for 2 weeks, then as needed 2. May take Tylenol Ex Str  1-2 q 6 hr PRN - Already on muscle relaxant, Tizanidine 3. Relative rest but keep shoulder mobile, demonstrated ROM exercises, avoid heavy lifting 4. May try heating pad PRN 5. RTC 4-6 weeks re-evaluation, if not improved consider subacromial steroid inj, X-rays eval for arthritis. If worsening night-time symptoms or weakness, consider discuss with existing [facility]      Relevant Medications   aspirin EC 81 MG tablet   naproxen (NAPROSYN) 500 MG tablet   Other constipation    See A&P for constipation, most likely etiology Treat with Miralax      Relevant Medications   polyethylene glycol powder (GLYCOLAX/MIRALAX) powder    Other Visit Diagnoses    Acute bursitis of right shoulder       Relevant Medications   naproxen (NAPROSYN) 500 MG tablet   Needs flu shot       Relevant Orders   Flu vaccine HIGH DOSE PF (Completed)      Meds ordered this encounter  Medications  . aspirin EC 81 MG tablet    Sig: Take by mouth.  . polyethylene glycol powder (GLYCOLAX/MIRALAX) powder    Sig: Take 17 g by mouth daily as needed.    Dispense:  250 g    Refill:  3  . naproxen (NAPROSYN) 500 MG tablet    Sig: Take 1 tablet (500 mg total) by mouth 2 (two) times daily with a meal. For 2-4 weeks then as needed    Dispense:  60 tablet    Refill:  2    Follow up plan: Return in about 6 weeks (around 01/05/2017) for R shoulder arthritis, constipation.  [name], DO [facility] 11/24/2016, 1:11 PM

## 2016-11-24 NOTE — Assessment & Plan Note (Addendum)
Recurrent chronic intermittent LLQ abdominal pain now with more constipation symptoms, on opiate as well - Similar to last 02/2016, had negative KUB, stool cx and ova/parasite, improved on miralax - History of international travel to home country [place] - No acute abdominal pain, benign exam  Plan: 1. Start Miralax again 1-2 cap 17-34g use daily for now, may need chronic daily dose in future 2. Increase fiber and hydration, improve diet 3. Recommended [facility] for repeat colonoscopy as planned, due 2018, he defers for now - may refer to GI if persistent worsening symptoms 4. Return criteria given if not improved

## 2016-11-24 NOTE — Assessment & Plan Note (Signed)
Consistent with subacute R-shoulder bursitis vs rotator cuff tendinopathy with some reduced active ROM but without significant evidence of muscle tear (no weakness). - No clear etiology of injury. 66 yr old patient with known underlying arthritis - No specific shoulder imaging on chart  Plan: 1. Start Naprosyn Naprosyn 500mg  twice daily (with food) for 2 weeks, then as needed 2. May take Tylenol Ex Str 1-2 q 6 hr PRN - Already on muscle relaxant, Tizanidine 3. Relative rest but keep shoulder mobile, demonstrated ROM exercises, avoid heavy lifting 4. May try heating pad PRN 5. RTC 4-6 weeks re-evaluation, if not improved consider subacromial steroid inj, X-rays eval for arthritis. If worsening night-time symptoms or weakness, consider discuss with existing [facility]

## 2016-11-24 NOTE — Assessment & Plan Note (Signed)
See A&P for constipation, most likely etiology Treat with Miralax

## 2016-11-24 NOTE — Patient Instructions (Addendum)
Thank you for coming to the clinic today.  1. You are most likely experiencing Constipation or slow bowel movements, worse while on Oxycodone - Start Miralax powder 1 capful 17g once daily for several days, goal is soft bowel movement 1-2x daily, if not helping may increase to 2 capfuls once daily  - Try to drink more water if possible  2. Plan on Colonoscopy from [facility] next year, call them when ready  3. For R shoulder pain, most likely bursitis or arthritis  Recommend trial of Anti-inflammatory with Naproxen (Naprosyn) 500mg  tabs - take one with food and plenty of water TWICE daily every day (breakfast and dinner), for next 2 to 4 weeks, then you may take only as needed - DO NOT TAKE any ibuprofen, aleve, motrin while you are taking this medicine - It is safe to take Tylenol Ext Str 500mg  tabs - take 1 to 2 (max dose 1000mg ) every 6 hours as needed for breakthrough pain, max 24 hour daily dose is 6 to 8 tablets or 4000mg   Use heating pad or muscle rub  Please schedule a Follow-up Appointment to: Return in about 6 weeks (around 01/05/2017) for R shoulder arthritis, constipation.  If you have any other questions or concerns, please feel free to call the clinic or send a message through [place]. You may also schedule an earlier appointment if necessary.  Additionally, you may be receiving a survey about your experience at our clinic within a few days to 1 week by e-mail or mail. We value your feedback.  [name], DO [facility], [facility]  Range of Motion Shoulder Exercises  [place] with your good arm against a counter or table for support [employer] forward with a wide stance (make sure your body is comfortable) - Your painful shoulder should hang down and feel "heavy" - Gently move your painful arm in small circles "clockwise" for several turns - Switch to "counterclockwise" for several turns - Early on keep circles narrow and move  slowly - Later in rehab, move in larger circles and faster movement   Wall Crawl - Stand close (about 1-2 ft away) to a wall, facing it directly - Reach out with your arm of painful shoulder and place fingers (not palm) on wall - You should make contact with wall at your waist level - Slowly walk your fingers up the wall. Stay in contact with wall entire time, do not remove fingers - Keep walking fingers up wall until you reach shoulder level - You may feel tightening or mild discomfort, once you reach a height that causes pain or if you are already above your shoulder height then stop. Repeat from starting position. - Early on stand closer to wall, move fingers slowly, and stay at or below shoulder level - Later in rehab, stand farther away from wall (fingertips), move fingers quicker, go above shoulder level

## 2016-11-26 DIAGNOSIS — Z96652 Presence of left artificial knee joint: Secondary | ICD-10-CM | POA: Diagnosis not present

## 2016-11-26 DIAGNOSIS — M25662 Stiffness of left knee, not elsewhere classified: Secondary | ICD-10-CM | POA: Diagnosis not present

## 2016-11-28 DIAGNOSIS — Z96652 Presence of left artificial knee joint: Secondary | ICD-10-CM | POA: Diagnosis not present

## 2016-11-28 DIAGNOSIS — M25662 Stiffness of left knee, not elsewhere classified: Secondary | ICD-10-CM | POA: Diagnosis not present

## 2016-12-01 DIAGNOSIS — M25662 Stiffness of left knee, not elsewhere classified: Secondary | ICD-10-CM | POA: Diagnosis not present

## 2016-12-01 DIAGNOSIS — Z96652 Presence of left artificial knee joint: Secondary | ICD-10-CM | POA: Diagnosis not present

## 2016-12-03 ENCOUNTER — Ambulatory Visit: Payer: Medicare HMO | Admitting: Family Medicine

## 2016-12-03 DIAGNOSIS — R1032 Left lower quadrant pain: Secondary | ICD-10-CM | POA: Diagnosis not present

## 2016-12-03 DIAGNOSIS — Z96652 Presence of left artificial knee joint: Secondary | ICD-10-CM | POA: Diagnosis not present

## 2016-12-03 DIAGNOSIS — Z87891 Personal history of nicotine dependence: Secondary | ICD-10-CM | POA: Diagnosis not present

## 2016-12-03 DIAGNOSIS — K5792 Diverticulitis of intestine, part unspecified, without perforation or abscess without bleeding: Secondary | ICD-10-CM | POA: Diagnosis not present

## 2016-12-03 DIAGNOSIS — R001 Bradycardia, unspecified: Secondary | ICD-10-CM | POA: Diagnosis not present

## 2016-12-03 DIAGNOSIS — N50811 Right testicular pain: Secondary | ICD-10-CM | POA: Diagnosis not present

## 2016-12-03 DIAGNOSIS — K644 Residual hemorrhoidal skin tags: Secondary | ICD-10-CM | POA: Diagnosis not present

## 2016-12-03 DIAGNOSIS — R1084 Generalized abdominal pain: Secondary | ICD-10-CM | POA: Diagnosis not present

## 2016-12-03 DIAGNOSIS — K449 Diaphragmatic hernia without obstruction or gangrene: Secondary | ICD-10-CM | POA: Diagnosis not present

## 2016-12-03 DIAGNOSIS — K5732 Diverticulitis of large intestine without perforation or abscess without bleeding: Secondary | ICD-10-CM | POA: Diagnosis not present

## 2016-12-03 DIAGNOSIS — M25662 Stiffness of left knee, not elsewhere classified: Secondary | ICD-10-CM | POA: Diagnosis not present

## 2016-12-03 DIAGNOSIS — N442 Benign cyst of testis: Secondary | ICD-10-CM | POA: Diagnosis not present

## 2016-12-03 DIAGNOSIS — K4090 Unilateral inguinal hernia, without obstruction or gangrene, not specified as recurrent: Secondary | ICD-10-CM | POA: Diagnosis not present

## 2016-12-04 ENCOUNTER — Encounter: Payer: Self-pay | Admitting: Family Medicine

## 2016-12-04 ENCOUNTER — Ambulatory Visit (INDEPENDENT_AMBULATORY_CARE_PROVIDER_SITE_OTHER): Payer: Medicare HMO | Admitting: Family Medicine

## 2016-12-04 VITALS — BP 131/81 | HR 55 | Temp 98.6°F | Resp 16 | Ht 66.0 in | Wt 160.4 lb

## 2016-12-04 DIAGNOSIS — D500 Iron deficiency anemia secondary to blood loss (chronic): Secondary | ICD-10-CM

## 2016-12-04 DIAGNOSIS — E119 Type 2 diabetes mellitus without complications: Secondary | ICD-10-CM

## 2016-12-04 DIAGNOSIS — R1032 Left lower quadrant pain: Secondary | ICD-10-CM

## 2016-12-04 DIAGNOSIS — K5792 Diverticulitis of intestine, part unspecified, without perforation or abscess without bleeding: Secondary | ICD-10-CM | POA: Insufficient documentation

## 2016-12-04 DIAGNOSIS — D649 Anemia, unspecified: Secondary | ICD-10-CM | POA: Insufficient documentation

## 2016-12-04 DIAGNOSIS — G248 Other dystonia: Secondary | ICD-10-CM

## 2016-12-04 NOTE — Assessment & Plan Note (Signed)
Improved on antibiotics x 24 hours, recent ED visit at [facility], with confirmed on CT scan mod-severe dx New problem, not previously identified, last colonoscopy 2008, do not have results available for access today Recent symptoms were not as suggestive of diverticulitis, but rather constipation, now this is resolved  Plan: 1. Stop Miralax, may use PRN only 2. Referral to [facility] for further management of diverticulitis and likely need up date Colonoscopy, last 2008 3. Follow-up if not improved consider switch antibiotics to alternative such as Cipro/Flagyl if need

## 2016-12-04 NOTE — Assessment & Plan Note (Addendum)
Stable, chronic Left foot deformity - chronic problem >14 years now. It is significantly affecting his gait and limiting his ambulation which appears to have affected degenerative wear on his Left knee, now s/p TKR L knee - Followed by Neurology / Ortho  Plan: 1. He will benefit from Diabetic Shoes - Written rx for Diabetic shoes and inserts - completed paperwork to be faxed back for these devices - Indicated for foot deformity on L due to dystonia

## 2016-12-04 NOTE — Progress Notes (Addendum)
Subjective:    Patient ID: Ross Riley, male    DOB: Dec 28, 1950, 66 y.o.   MRN: [id number]  Ross Riley is a 66 y.o. male presenting on 12/04/2016 for Hospitalization Follow-up (abdominal pain improved )   HPI  ED FOLLOW-UP VISIT  Hospital/Location: [facility] Date of ED Visit: 12/03/16  Reason for Presenting to ED: Abdominal Pain Primary (+Secondary) Diagnosis: Diverticulitis  FOLLOW-UP - ED provider note and record have been reviewed - Patient presents today about 1 day after recent ED visit. Brief summary of recent course, patient had symptoms of persistent lower abdominal pain had seen me in office on 11/24/16 see note, treated for constipation based on history and description of symptoms, given rx miralax 1 cap daily, limited improvement then worse pain, presented to ED on 12/03/16, testing in ED with labs unremarkable CMET, lactate, UA, lipase, had CBC with mild anemia 12.9 Hgb similar to last 12.8 overall remains mildly low, also had Abdominal CT see below for results, identified moderate to severe acute uncomplicated diverticulitis. Also found 2 lesions on liver. Treated with antibiotics Augmentin, and symptom control. Discharged. - Today reports overall has done well after discharge from ED. Symptoms of lower abdominal pain are much improved - New medications on discharge: Augmentin BID X 10 days - Changes to current meds on discharge: None - Additional ROS: He denies any blood in stool, rectal bleeding, nausea vomiting hematemesis, fever/chills, sweats, chest pain, dyspnea - he is interested to follow-up with a GI specialist. He requests to go to [facility]. He is also due for upcoming colonoscopy, last done 2008.  Mild anemia: - He is asking about blood level, it was checked at [facility] ED, reviewed hemoglobin results with him. Prior readings have been similar, previously was better up to Hgb 14, and no prior anemia. He is not taking iron - Denies weakness, fatigue,  temperature change, exertional symptoms  --------------------------- Additional concerns with requesting Diabetic Shoes - he has already received a written rx for this, and now we have received fax paperwork from [facility], he is awaiting further instruction when ready to pick up.  Diabetes with Chronic Left Lower Ext L ankle Dystonia Chronic history of DM2, has been well controlled on current regimen A1c <7.0, due to return within 1 month for repeat A1c and routine monitoring. He does not have confirmed diabetic neuropathy. - Specifically regarding Left lower extremity dystonia and instability. He has known history of osteoarthritis from prior imaging in 2016. Previously followed by Orthopedics in [place]. He is currently s/p L TKR and wears AFO for chronic L ankle dystonia - Describes aching pain, some instability, has not had significant fall, uses cane for assistance with ambulation, has gait difficulty due to chronic LLE dystonia in left ankle / foot (chart review shows this focal dystonia since 1994, denies this is related to CVA, has tried botox injections and other, previously seen by [facility] in 2014 - Admits some knee pain and swelling, currently not painful - Wears compression knee sleeve with improvement in stability - Denies fall, trauma, redness, other joint problem  Health Maintenance: - Due for Colonoscopy, will follow-up with GI - Now age 28 due for Pneumonia vaccine, will return for routine follow-up  Depression screen [facility] 2/9 11/24/2016 06/05/2016 01/12/2015  Decreased Interest 0 0 0  Down, Depressed, Hopeless 0 0 0  PHQ - 2 Score 0 0 0    Social History  Substance Use Topics  .  Smoking status: Former Smoker    Packs/day: 1.00    Years: 10.00    Types: Cigarettes    Quit date: 03/04/1991  . Smokeless tobacco: Never Used  . Alcohol use 0.0 oz/week     Comment: occasional    Review of Systems Per HPI unless specifically indicated  above     Objective:    BP 131/81   Pulse (!) 55   Temp 98.6 F (37 C) (Oral)   Resp 16   Ht 5\' 6"  (1.676 m)   Wt 160 lb 6.4 oz (72.8 kg)   BMI 25.89 kg/m   Wt Readings from Last 3 Encounters:  12/04/16 160 lb 6.4 oz (72.8 kg)  11/24/16 158 lb 9.6 oz (71.9 kg)  06/05/16 161 lb (73 kg)    Physical Exam  Constitutional: He is oriented to person, place, and time. He appears well-developed and well-nourished. No distress.  Well-appearing, comfortable, cooperative  HENT:  Head: Normocephalic and atraumatic.  Mouth/Throat: Oropharynx is clear and moist.  Eyes: Conjunctivae are normal. Right eye exhibits no discharge. Left eye exhibits no discharge.  Neck: Normal range of motion. Neck supple.  Cardiovascular: Regular rhythm, normal heart sounds and intact distal pulses.   No murmur heard. Bradycardic  Pulmonary/Chest: Effort normal and breath sounds normal. No respiratory distress. He has no wheezes. He has no rales.  Abdominal: Soft. Bowel sounds are normal. He exhibits no distension and no mass. There is tenderness (mild discomfort LLQ only, improved). There is no rebound.  Musculoskeletal: He exhibits no edema.  Left Lower Extremity Inspection: Smaller appearance compared to R, some misalignment with some varus deformity. No effusion or erythema  Palpation: Non tender. Significant crepitus on motion. ROM: Full active ROM bilaterally Strength: Limited strength testing with focal LLE dystonia worst lower leg ankle/foot  Gait is antalgic and with L foot drop, uses cane for ambulation  Neurological: He is alert and oriented to person, place, and time. No cranial nerve deficit.  Skin: Skin is warm and dry. No rash noted. He is not diaphoretic. No erythema.  Psychiatric: He has a normal mood and affect. His behavior is normal.  Well groomed, good eye contact, normal speech and thoughts  Nursing note and vitals reviewed.   Diabetic Foot Exam - Simple   Simple Foot Form Diabetic  Foot exam was performed with the following findings:  Yes 12/04/2016  1:29 PM  Visual Inspection No deformities, no ulcerations, no other skin breakdown bilaterally:  Yes Sensation Testing Intact to touch and monofilament testing bilaterally:  Yes Pulse Check Posterior Tibialis and Dorsalis pulse intact bilaterally:  Yes Comments Reduced muscle tone of Left lower extremity with chronic dystonia     Results for orders placed or performed during the hospital encounter of 37/62/83  Basic metabolic panel  Result Value Ref Range   Sodium 139 135 - 145 mmol/L   Potassium 4.0 3.5 - 5.1 mmol/L   Chloride 103 101 - 111 mmol/L   CO2 28 22 - 32 mmol/L   Glucose, Bld 108 (H) 65 - 99 mg/dL   BUN 13 6 - 20 mg/dL   Creatinine, Ser 0.93 0.61 - 1.24 mg/dL   Calcium 9.3 8.9 - 10.3 mg/dL   GFR calc non Af Amer >60 >60 mL/min   GFR calc Af Amer >60 >60 mL/min   Anion gap 8 5 - 15  CBC  Result Value Ref Range   WBC 6.6 3.8 - 10.6 K/uL   RBC 5.22 4.40 - 5.90  MIL/uL   Hemoglobin 15.6 13.0 - 18.0 g/dL   HCT 45.6 40.0 - 52.0 %   MCV 87.4 80.0 - 100.0 fL   MCH 29.9 26.0 - 34.0 pg   MCHC 34.2 32.0 - 36.0 g/dL   RDW 13.1 11.5 - 14.5 %   Platelets 201 150 - 440 K/uL  Urinalysis, Complete w Microscopic  Result Value Ref Range   Color, Urine YELLOW (A) YELLOW   APPearance CLEAR (A) CLEAR   Specific Gravity, Urine 1.027 1.005 - 1.030   pH 6.0 5.0 - 8.0   Glucose, UA NEGATIVE NEGATIVE mg/dL   Hgb urine dipstick SMALL (A) NEGATIVE   Bilirubin Urine NEGATIVE NEGATIVE   Ketones, ur NEGATIVE NEGATIVE mg/dL   Protein, ur NEGATIVE NEGATIVE mg/dL   Nitrite NEGATIVE NEGATIVE   Leukocytes, UA NEGATIVE NEGATIVE   RBC / HPF 0-5 0 - 5 RBC/hpf   WBC, UA 0-5 0 - 5 WBC/hpf   Bacteria, UA NONE SEEN NONE SEEN   Squamous Epithelial / LPF NONE SEEN NONE SEEN   Mucus PRESENT   APTT  Result Value Ref Range   aPTT 24 24 - 36 seconds  Protime-INR  Result Value Ref Range   Prothrombin Time 12.9 11.4 - 15.2 seconds    INR 0.97       Assessment & Plan:   Problem List Items Addressed This Visit    Abdominal pain, LLQ (left lower quadrant)    Improved now on antibiotics See A&P Diverticulitis - May old off on Miralax now - Follow-up with GI - referral to [facility]      Relevant Orders   Ambulatory referral to Gastroenterology   Anemia    Mild presumed IDA with Hemoglobin stable 12.5 to 12.9 Possibly related to some lower GI losses, without other red flags Newly dx diverticulitis on CT - Referral to [facility] for further management, will need updated colonoscopy and eval of other potential causes of blood loss, if unremarkable in future can consider iron supplement      Diverticulitis - Primary    Improved on antibiotics x 24 hours, recent ED visit at [facility], with confirmed on CT scan mod-severe dx New problem, not previously identified, last colonoscopy 2008, do not have results available for access today Recent symptoms were not as suggestive of diverticulitis, but rather constipation, now this is resolved  Plan: 1. Stop Miralax, may use PRN only 2. Referral to [facility] for further management of diverticulitis and likely need up date Colonoscopy, last 2008 3. Follow-up if not improved consider switch antibiotics to alternative such as Cipro/Flagyl if need      Relevant Orders   Ambulatory referral to Gastroenterology   Focal dystonia    Stable, chronic Left foot deformity - chronic problem >14 years now. It is significantly affecting his gait and limiting his ambulation which appears to have affected degenerative wear on his Left knee, now s/p TKR L knee - Followed by Neurology / Ortho  Plan: 1. He will benefit from Diabetic Shoes - Written rx for Diabetic shoes and inserts - completed paperwork to be faxed back for these devices - Indicated for foot deformity on L due to dystonia      Type 2 diabetes mellitus without complications ([place])    Well-controlled DM with A1c < 7.0 No known  complications or hypoglycemia.  Plan:  1. Continue current diet control / lifestyle therapy. Continue to improve as planned 2. Monitor CBG 3. Continue ASA, ACEi, Statin 4. DM Foot  exam done today / Advised to schedule DM ophtho exam, send record - He will benefit from Diabetic Shoes, see order / request form 5. Follow-up within 1 month for routine DM visit A1c          Follow up plan: Return in about 5 weeks (around 01/06/2017) for keep scheduled apt.  Ross Riley, [facility] 12/04/2016, 1:58 PM

## 2016-12-04 NOTE — Patient Instructions (Addendum)
Thank you for coming to the clinic today.  1. You have diverticulitis - inflammation and infection of bowel, this is causing your pain and flare up. Also likely may be cause of some blood loss and some anemia that is mild.  Finish antibiotic. If your symptoms come back, please call us back and we may add or switch the antibiotic. Or if severe can go back to Hospital.  I am referring you to a GI (Stomach/Colon) specialist to discuss this problem further, you may need another imaging scan or a scope to evaluate for the blood loss  They will call you with an appointment if you do not hear back in 1-2 weeks then call them to check status  [facility]  Address: [address], [place], [place], [place] [address]      Phone: [phone]   Please schedule a Follow-up Appointment to: Return in about 5 weeks (around 01/06/2017) for keep scheduled apt.  If you have any other questions or concerns, please feel free to call the clinic or send a message through [place]. You may also schedule an earlier appointment if necessary.  Additionally, you may be receiving a survey about your experience at our clinic within a few days to 1 week by e-mail or mail. We value your feedback.  [name], [facility], [place]   Diverticulitis (Diverticulitis) La diverticulitis ocurre cuando pequeos bolsillos que se han formado en el colon (intestino grueso) se infectan o se inflaman. CUIDADOS EN EL HOGAR  Siga las indicaciones del mdico.  Siga una dieta especial si el mdico se lo indic.  Cuando se sienta mejor, el mdico puede indicarle que cambie la dieta. Tal vez le indiquen que coma gran cantidad de [place]. Las frutas y los vegetales son buenas fuentes de [place]. La fibra facilita la evacuacin intestinal (defecacin).  Tome los suplementos o los probiticos como le indic el [place] los medicamentos solamente como se lo haya indicado el  mdico.  [place]um[place]a con todas las visitas de control con su mdico.  SOLICITE AYUDA SI:  El dolor no mejora.  Le resulta difcil alimentarse.  No defeca como lo hace normalmente.  SOLICITE AYUDA DE INMEDIATO SI:  El dolor empeora.  Los problemas no mejoran.  Los problemas empeoran repentinamente.  Tiene fiebre.  No deja de vomitar.  La materia fecal (heces) es sanguinolenta o negra, de aspecto alquitranado.  ASEGRESE DE QUE:  Comprende estas instrucciones.  Controlar su afeccin.  Recibir ayuda de inmediato si no mejora o si empeora.  Esta informacin no tiene Marine scientist el consejo del mdico. Asegrese de hacerle al mdico cualquier pregunta que tenga. Document Released: 02/06/2011 Document Revised: 02/22/2013 Document Reviewed: 01/12/2013 Elsevier Interactive Patient Education  2017 [employer].

## 2016-12-04 NOTE — Assessment & Plan Note (Signed)
Improved now on antibiotics See A&P Diverticulitis - May old off on Miralax now - Follow-up with GI - referral to [facility]

## 2016-12-04 NOTE — Assessment & Plan Note (Signed)
Mild presumed IDA with Hemoglobin stable 12.5 to 12.9 Possibly related to some lower GI losses, without other red flags Newly dx diverticulitis on CT - Referral to [facility] for further management, will need updated colonoscopy and eval of other potential causes of blood loss, if unremarkable in future can consider iron supplement

## 2016-12-04 NOTE — Assessment & Plan Note (Addendum)
Well-controlled DM with A1c < 7.0 No known complications or hypoglycemia.  Plan:  1. Continue current diet control / lifestyle therapy. Continue to improve as planned 2. Monitor CBG 3. Continue ASA, ACEi, Statin 4. DM Foot exam done today / Advised to schedule DM ophtho exam, send record - He will benefit from Diabetic Shoes, see order / request form 5. Follow-up within 1 month for routine DM visit A1c

## 2016-12-05 ENCOUNTER — Ambulatory Visit: Payer: Commercial Managed Care - HMO | Admitting: Family Medicine

## 2016-12-08 DIAGNOSIS — Z96652 Presence of left artificial knee joint: Secondary | ICD-10-CM | POA: Diagnosis not present

## 2016-12-08 DIAGNOSIS — M25662 Stiffness of left knee, not elsewhere classified: Secondary | ICD-10-CM | POA: Diagnosis not present

## 2016-12-10 ENCOUNTER — Telehealth: Payer: Self-pay | Admitting: Family Medicine

## 2016-12-10 DIAGNOSIS — K5792 Diverticulitis of intestine, part unspecified, without perforation or abscess without bleeding: Secondary | ICD-10-CM

## 2016-12-10 DIAGNOSIS — M25662 Stiffness of left knee, not elsewhere classified: Secondary | ICD-10-CM | POA: Diagnosis not present

## 2016-12-10 DIAGNOSIS — Z96652 Presence of left artificial knee joint: Secondary | ICD-10-CM | POA: Diagnosis not present

## 2016-12-10 MED ORDER — CIPROFLOXACIN HCL 500 MG PO TABS: 500.0000 mg | ORAL_TABLET | Freq: Two times a day (BID) | ORAL | 0 refills | Status: DC

## 2016-12-10 MED ORDER — METRONIDAZOLE 500 MG PO TABS: 500.0000 mg | ORAL_TABLET | Freq: Three times a day (TID) | ORAL | 0 refills | Status: DC

## 2016-12-10 NOTE — Telephone Encounter (Signed)
Received call from patient's daughter, Ross Riley, regarding concern for her father's recurrent vs persistent GI symptoms. He was seen at [facility] ED on 10/3, dx diverticulitis, treated with Augmentin, discharged, and I saw him on 10/4, see note, he was reportedly improved at that time. Now daughter states 6 days later he still has lower abdominal pain, and recurrent chills without measured fever, he has 1 more day of amoxicillin left.  I advised her that it sounds like he still has symptoms of diverticulitis. He will need to switch to new antibiotics since not responding. Sent new rx Cipro 500mg  BID x 10 days and Flagyl 500mg  TID x 10 days to [place], stop augmentin today last day and switch to new antibiotics. If his symptoms include worsening fevers/chills, worsening pain, diarrhea bloody stool nausea vomiting or other worsening concerns despite antibiotics within 24-48 hours, he may need to go back to hospital ED. Additionally they have already been contacted by [facility] after I referred him on 10/4, and their soonest available apt is 01/05/17.  [name], DO [facility] 12/10/2016, 8:16 AM

## 2016-12-11 DIAGNOSIS — Z87891 Personal history of nicotine dependence: Secondary | ICD-10-CM | POA: Diagnosis not present

## 2016-12-11 DIAGNOSIS — K5792 Diverticulitis of intestine, part unspecified, without perforation or abscess without bleeding: Secondary | ICD-10-CM | POA: Diagnosis not present

## 2016-12-11 DIAGNOSIS — I517 Cardiomegaly: Secondary | ICD-10-CM | POA: Diagnosis not present

## 2016-12-11 DIAGNOSIS — R509 Fever, unspecified: Secondary | ICD-10-CM | POA: Diagnosis not present

## 2016-12-15 DIAGNOSIS — M216X2 Other acquired deformities of left foot: Secondary | ICD-10-CM | POA: Diagnosis not present

## 2016-12-15 DIAGNOSIS — Z96652 Presence of left artificial knee joint: Secondary | ICD-10-CM | POA: Diagnosis not present

## 2016-12-15 DIAGNOSIS — M25662 Stiffness of left knee, not elsewhere classified: Secondary | ICD-10-CM | POA: Diagnosis not present

## 2016-12-15 DIAGNOSIS — M25372 Other instability, left ankle: Secondary | ICD-10-CM | POA: Diagnosis not present

## 2016-12-15 DIAGNOSIS — E119 Type 2 diabetes mellitus without complications: Secondary | ICD-10-CM | POA: Diagnosis not present

## 2016-12-15 DIAGNOSIS — M25373 Other instability, unspecified ankle: Secondary | ICD-10-CM | POA: Diagnosis not present

## 2016-12-15 DIAGNOSIS — E1169 Type 2 diabetes mellitus with other specified complication: Secondary | ICD-10-CM | POA: Diagnosis not present

## 2016-12-15 DIAGNOSIS — M216X9 Other acquired deformities of unspecified foot: Secondary | ICD-10-CM | POA: Diagnosis not present

## 2016-12-15 DIAGNOSIS — G248 Other dystonia: Secondary | ICD-10-CM | POA: Diagnosis not present

## 2016-12-17 DIAGNOSIS — Z96652 Presence of left artificial knee joint: Secondary | ICD-10-CM | POA: Diagnosis not present

## 2016-12-17 DIAGNOSIS — M25662 Stiffness of left knee, not elsewhere classified: Secondary | ICD-10-CM | POA: Diagnosis not present

## 2016-12-18 DIAGNOSIS — Z471 Aftercare following joint replacement surgery: Secondary | ICD-10-CM | POA: Diagnosis not present

## 2016-12-18 DIAGNOSIS — M25662 Stiffness of left knee, not elsewhere classified: Secondary | ICD-10-CM | POA: Diagnosis not present

## 2016-12-18 DIAGNOSIS — Z96652 Presence of left artificial knee joint: Secondary | ICD-10-CM | POA: Diagnosis not present

## 2016-12-22 ENCOUNTER — Telehealth: Payer: Self-pay | Admitting: Family Medicine

## 2016-12-22 DIAGNOSIS — K5792 Diverticulitis of intestine, part unspecified, without perforation or abscess without bleeding: Secondary | ICD-10-CM

## 2016-12-22 MED ORDER — CIPROFLOXACIN HCL 500 MG PO TABS: 500.0000 mg | ORAL_TABLET | Freq: Two times a day (BID) | ORAL | 0 refills | Status: DC

## 2016-12-22 MED ORDER — METRONIDAZOLE 500 MG PO TABS: 500.0000 mg | ORAL_TABLET | Freq: Three times a day (TID) | ORAL | 0 refills | Status: DC

## 2016-12-22 NOTE — Telephone Encounter (Signed)
Lemmie Evens said pt is still having chills.  Please call [phone]

## 2016-12-22 NOTE — Telephone Encounter (Signed)
As per daughter patient is complaining about feeling hot in stomach and chills.

## 2016-12-22 NOTE — Telephone Encounter (Signed)
See prior telephone note 10/10, last office visit 10/4.  Called daughter, Ross Riley, back (last time daughter Ross Riley called and I spoke to her). I spoke with Ross Riley today regarding her concerns, she thinks that the recent antibiotic course Cipro / Flagyl for 10 days worked very well he felt much better but then now after finished antibiotics some symptoms starting to come back with abdominal upset and discomfort and some chills. His [facility] apt is not until Tuesday 01/06/17 by report, that is soonest they could get, and they have been placed on call back list for cancellation.  I advised them that I am concerned his symptoms have not resolved with antibiotics, however since improved I would agree to extend to 1 more week trial. I do not have much else to offer.  New rx sent Cipro Flagyl for 7 more days to pharmacy.  I advised that if not improved again he should follow-up or seek more immediate attention at hospital ED and may need IV antibiotics or other repeat imaging.  Ultimately he needs to follow-up with [facility], and they should contact GI to notify them of this update, and try again to get in sooner.  [name], [facility] 12/22/2016, 11:43 AM

## 2017-01-04 DIAGNOSIS — K5792 Diverticulitis of intestine, part unspecified, without perforation or abscess without bleeding: Secondary | ICD-10-CM | POA: Diagnosis not present

## 2017-01-04 DIAGNOSIS — R1032 Left lower quadrant pain: Secondary | ICD-10-CM | POA: Diagnosis not present

## 2017-01-04 DIAGNOSIS — R110 Nausea: Secondary | ICD-10-CM | POA: Diagnosis not present

## 2017-01-04 DIAGNOSIS — R10814 Left lower quadrant abdominal tenderness: Secondary | ICD-10-CM | POA: Diagnosis not present

## 2017-01-04 DIAGNOSIS — K529 Noninfective gastroenteritis and colitis, unspecified: Secondary | ICD-10-CM | POA: Diagnosis not present

## 2017-01-04 DIAGNOSIS — Z87891 Personal history of nicotine dependence: Secondary | ICD-10-CM | POA: Diagnosis not present

## 2017-01-06 ENCOUNTER — Ambulatory Visit: Payer: Medicare HMO | Admitting: Family Medicine

## 2017-01-06 DIAGNOSIS — R197 Diarrhea, unspecified: Secondary | ICD-10-CM | POA: Diagnosis not present

## 2017-01-06 DIAGNOSIS — R933 Abnormal findings on diagnostic imaging of other parts of digestive tract: Secondary | ICD-10-CM | POA: Diagnosis not present

## 2017-01-06 DIAGNOSIS — R1032 Left lower quadrant pain: Secondary | ICD-10-CM | POA: Diagnosis not present

## 2017-01-07 ENCOUNTER — Ambulatory Visit: Payer: Medicare HMO | Admitting: Family Medicine

## 2017-01-08 ENCOUNTER — Telehealth: Payer: Self-pay

## 2017-01-08 ENCOUNTER — Ambulatory Visit: Payer: Medicare HMO | Admitting: Family Medicine

## 2017-01-08 ENCOUNTER — Encounter: Payer: Self-pay | Admitting: Family Medicine

## 2017-01-08 VITALS — BP 104/78 | HR 72 | Temp 98.8°F | Resp 16 | Ht 66.0 in | Wt 153.0 lb

## 2017-01-08 DIAGNOSIS — K5900 Constipation, unspecified: Secondary | ICD-10-CM | POA: Diagnosis not present

## 2017-01-08 DIAGNOSIS — K219 Gastro-esophageal reflux disease without esophagitis: Secondary | ICD-10-CM | POA: Diagnosis not present

## 2017-01-08 DIAGNOSIS — G8929 Other chronic pain: Secondary | ICD-10-CM | POA: Diagnosis not present

## 2017-01-08 DIAGNOSIS — R1032 Left lower quadrant pain: Secondary | ICD-10-CM | POA: Diagnosis not present

## 2017-01-08 DIAGNOSIS — I10 Essential (primary) hypertension: Secondary | ICD-10-CM | POA: Diagnosis not present

## 2017-01-08 DIAGNOSIS — A0472 Enterocolitis due to Clostridium difficile, not specified as recurrent: Secondary | ICD-10-CM | POA: Diagnosis not present

## 2017-01-08 DIAGNOSIS — K5792 Diverticulitis of intestine, part unspecified, without perforation or abscess without bleeding: Secondary | ICD-10-CM

## 2017-01-08 DIAGNOSIS — R109 Unspecified abdominal pain: Secondary | ICD-10-CM | POA: Diagnosis not present

## 2017-01-08 DIAGNOSIS — Z87891 Personal history of nicotine dependence: Secondary | ICD-10-CM | POA: Diagnosis not present

## 2017-01-08 DIAGNOSIS — Z96652 Presence of left artificial knee joint: Secondary | ICD-10-CM | POA: Diagnosis not present

## 2017-01-08 DIAGNOSIS — K5732 Diverticulitis of large intestine without perforation or abscess without bleeding: Secondary | ICD-10-CM | POA: Diagnosis not present

## 2017-01-08 DIAGNOSIS — R110 Nausea: Secondary | ICD-10-CM

## 2017-01-08 DIAGNOSIS — K5721 Diverticulitis of large intestine with perforation and abscess with bleeding: Secondary | ICD-10-CM | POA: Diagnosis not present

## 2017-01-08 DIAGNOSIS — H409 Unspecified glaucoma: Secondary | ICD-10-CM | POA: Diagnosis not present

## 2017-01-08 DIAGNOSIS — E119 Type 2 diabetes mellitus without complications: Secondary | ICD-10-CM | POA: Diagnosis not present

## 2017-01-08 DIAGNOSIS — E785 Hyperlipidemia, unspecified: Secondary | ICD-10-CM | POA: Diagnosis not present

## 2017-01-08 DIAGNOSIS — K5733 Diverticulitis of large intestine without perforation or abscess with bleeding: Secondary | ICD-10-CM | POA: Diagnosis not present

## 2017-01-08 LAB — COMPLETE METABOLIC PANEL WITH GFR
AG Ratio: 1.3 (calc) (ref 1.0–2.5)
ALKALINE PHOSPHATASE (APISO): 51 U/L (ref 40–115)
ALT: 13 U/L (ref 9–46)
AST: 14 U/L (ref 10–35)
Albumin: 4.2 g/dL (ref 3.6–5.1)
BUN: 10 mg/dL (ref 7–25)
CALCIUM: 8.9 mg/dL (ref 8.6–10.3)
CO2: 29 mmol/L (ref 20–32)
CREATININE: 0.77 mg/dL (ref 0.70–1.25)
Chloride: 100 mmol/L (ref 98–110)
GFR, EST NON AFRICAN AMERICAN: 95 mL/min/{1.73_m2} (ref >=60)
GFR, Est African American: 110 mL/min/{1.73_m2} (ref >=60)
GLOBULIN: 3.3 g/dL (ref 1.9–3.7)
GLUCOSE: 112 mg/dL — AB (ref 65–99)
Potassium: 3.5 mmol/L (ref 3.5–5.3)
SODIUM: 138 mmol/L (ref 135–146)
Total Bilirubin: 0.7 mg/dL (ref 0.2–1.2)
Total Protein: 7.5 g/dL (ref 6.1–8.1)

## 2017-01-08 LAB — CBC WITH DIFFERENTIAL/PLATELET
BASOS PCT: 0.5 %
Basophils Absolute: 28 cells/uL (ref 0–200)
EOS ABS: 50 {cells}/uL (ref 15–500)
Eosinophils Relative: 0.9 %
HCT: 43.7 % (ref 38.5–50.0)
HEMOGLOBIN: 14.7 g/dL (ref 13.2–17.1)
LYMPHS ABS: 2934 {cells}/uL (ref 850–3900)
MCH: 28.7 pg (ref 27.0–33.0)
MCHC: 33.6 g/dL (ref 32.0–36.0)
MCV: 85.4 fL (ref 80.0–100.0)
MPV: 10.3 fL (ref 7.5–12.5)
Monocytes Relative: 13 %
NEUTROS ABS: 1859 {cells}/uL (ref 1500–7800)
Neutrophils Relative %: 33.2 %
PLATELETS: 252 10*3/uL (ref 140–400)
RBC: 5.12 10*6/uL (ref 4.20–5.80)
RDW: 13 % (ref 11.0–15.0)
TOTAL LYMPHOCYTE: 52.4 %
WBC: 5.6 10*3/uL (ref 3.8–10.8)
WBCMIX: 728 {cells}/uL (ref 200–950)

## 2017-01-08 LAB — LIPASE: Lipase: 22 U/L (ref 7–60)

## 2017-01-08 MED ORDER — ONDANSETRON 4 MG PO TBDP
4.0000 mg | ORAL_TABLET | Freq: Three times a day (TID) | ORAL | 1 refills | Status: DC | PRN
Start: 2017-01-08 — End: 2018-03-29

## 2017-01-08 MED ORDER — DICYCLOMINE HCL 10 MG PO CAPS: 10.0000 mg | ORAL_CAPSULE | Freq: Three times a day (TID) | ORAL | 1 refills | Status: DC

## 2017-01-08 NOTE — Progress Notes (Signed)
Subjective:    Patient ID: Ross Riley, male    DOB: [birth year], 66 y.o.   MRN: [id number]  Ross Riley is a 66 y.o. male presenting on 01/08/2017 for Abdominal Pain (as per patient has blood in stool went to bathroom  X 4 today)  Patient provides history in Spanish today interpretation by daughter.  HPI   ACUTE / RECURRENT DIVERTICULITIS / Rectal Bleeding / LLQ Abdominal Pain - Last visit with me 12/04/16 and last telephone discussion on 10/10 and 10/22 for same problem, see prior notes for background information, briefly he has been treated for recurrent or persistent diverticulitis symptoms LLQ pain for while now initially since 11/2016, he has completed several antibiotic courses outpatient, including recent CIpro / Flagyl 10 day course with improvement and then recurrence he was given 7 more day extension on 10/22 that helped but then after finished had recurrence of symptoms. He has been to hospital ED twice on 12/11/16 and 01/04/17, has had CT imaging, without evidence of complication, he was referred by me to [facility] back in September, saw Dr Bayard Hugger on 01/06/17 (2 days ago) and had arrangements for future colonoscopy and stool cultures to check for other infection etiology, he has existing oxycodone for knee pain which helps some of his abdominal pain, but states that when he was at their office he was not having as severe pain or episodes - Today has worsening symptoms again now within past 24-48 hours with worse LLQ abdominal pain episodic, cramping pain, diarrhea loose stools with red rectal bleeding mixed into stool, some nausea without vomiting - Admits chills without measured fever - Admits reduced PO intake - Denies chest pain dyspnea, vomiting, fevers  He had other complaints initially for this apt but decided to change to more pressing issue of abdominal complaint, R shoulder pain is not as severe.  Depression screen [facility] 2/9 11/24/2016 06/05/2016 01/12/2015  Decreased Interest  0 0 0  Down, Depressed, Hopeless 0 0 0  PHQ - 2 Score 0 0 0    Social History   Tobacco Use  . Smoking status: Former Smoker    Packs/day: 1.00    Years: 10.00    Pack years: 10.00    Types: Cigarettes    Last attempt to quit: 03/04/1991    Years since quitting: 25.8  . Smokeless tobacco: Never Used  Substance Use Topics  . Alcohol use: Yes    Alcohol/week: 0.0 oz    Comment: occasional  . Drug use: No    Review of Systems Per HPI unless specifically indicated above     Objective:    BP 104/78   Pulse 72   Temp 98.8 F (37.1 C) (Oral)   Resp 16   Ht 5\' 6"  (1.676 m)   Wt 153 lb (69.4 kg)   BMI 24.69 kg/m   Wt Readings from Last 3 Encounters:  01/08/17 153 lb (69.4 kg)  12/04/16 160 lb 6.4 oz (72.8 kg)  11/24/16 158 lb 9.6 oz (71.9 kg)    Physical Exam  Constitutional: He is oriented to person, place, and time. He appears well-developed and well-nourished. No distress.  Uncomfortable abdominal pain, cooperative  HENT:  Head: Normocephalic and atraumatic.  Mild dry mucus mem  Eyes: Conjunctivae are normal. Right eye exhibits no discharge. Left eye exhibits no discharge.  Cardiovascular: Normal rate, regular rhythm, normal heart sounds and intact distal pulses.  No murmur heard. Pulmonary/Chest: Effort normal and breath sounds normal. No respiratory distress.  He has no wheezes. He has no rales.  Abdominal: Soft. Bowel sounds are normal. He exhibits no distension and no mass. There is tenderness (generalized lower abdomen, LLQ increased). There is no rebound and no guarding.  Musculoskeletal: He exhibits no edema.  Neurological: He is alert and oriented to person, place, and time.  Skin: Skin is warm and dry. He is not diaphoretic. No erythema.  Psychiatric: His behavior is normal.  Nursing note and vitals reviewed.  Results for orders placed or performed during the hospital encounter of 94/85/46  Basic metabolic panel  Result Value Ref Range   Sodium 139 135 -  145 mmol/L   Potassium 4.0 3.5 - 5.1 mmol/L   Chloride 103 101 - 111 mmol/L   CO2 28 22 - 32 mmol/L   Glucose, Bld 108 (H) 65 - 99 mg/dL   BUN 13 6 - 20 mg/dL   Creatinine, Ser 0.93 0.61 - 1.24 mg/dL   Calcium 9.3 8.9 - 10.3 mg/dL   GFR calc non Af Amer >60 >60 mL/min   GFR calc Af Amer >60 >60 mL/min   Anion gap 8 5 - 15  CBC  Result Value Ref Range   WBC 6.6 3.8 - 10.6 K/uL   RBC 5.22 4.40 - 5.90 MIL/uL   Hemoglobin 15.6 13.0 - 18.0 g/dL   HCT 45.6 40.0 - 52.0 %   MCV 87.4 80.0 - 100.0 fL   MCH 29.9 26.0 - 34.0 pg   MCHC 34.2 32.0 - 36.0 g/dL   RDW 13.1 11.5 - 14.5 %   Platelets 201 150 - 440 K/uL  Urinalysis, Complete w Microscopic  Result Value Ref Range   Color, Urine YELLOW (A) YELLOW   APPearance CLEAR (A) CLEAR   Specific Gravity, Urine 1.027 1.005 - 1.030   pH 6.0 5.0 - 8.0   Glucose, UA NEGATIVE NEGATIVE mg/dL   Hgb urine dipstick SMALL (A) NEGATIVE   Bilirubin Urine NEGATIVE NEGATIVE   Ketones, ur NEGATIVE NEGATIVE mg/dL   Protein, ur NEGATIVE NEGATIVE mg/dL   Nitrite NEGATIVE NEGATIVE   Leukocytes, UA NEGATIVE NEGATIVE   RBC / HPF 0-5 0 - 5 RBC/hpf   WBC, UA 0-5 0 - 5 WBC/hpf   Bacteria, UA NONE SEEN NONE SEEN   Squamous Epithelial / LPF NONE SEEN NONE SEEN   Mucus PRESENT   APTT  Result Value Ref Range   aPTT 24 24 - 36 seconds  Protime-INR  Result Value Ref Range   Prothrombin Time 12.9 11.4 - 15.2 seconds   INR 0.97       Assessment & Plan:   Problem List Items Addressed This Visit    Abdominal pain, LLQ (left lower quadrant)   Relevant Orders   Lipase   Diverticulitis - Primary   Relevant Medications   dicyclomine (BENTYL) 10 MG capsule   Other Relevant Orders   COMPLETE METABOLIC PANEL WITH GFR   CBC with Differential/Platelet    Other Visit Diagnoses    Nausea       Relevant Medications   ondansetron (ZOFRAN ODT) 4 MG disintegrating tablet      Concern now recurrence again of persistent diverticulitis that has been only temporarily  relieved by several antibiotic courses, seems refractory to outpatient treatment. Last antibiotic course 10 days in October (Cipro Flagyl) and then extended 7 more days just finished within past 1 week. - Has had CT imaging, reviewed mod-severe disease without acute complication - Last colonoscopy 2008, to be scheduled again in  near future by [facility]  Plan: 1. STAT labs CMET, CBC, Lipase today - I initially advised patient to go directly to [facility] ED but they wanted to call their doctor first and wanted to check labs. - Limited options outpatient at this point. I advised them to contact their [facility] Dr Bayard Hugger to review the case, and advised that we would call them as well to keep them informed of change of symptoms - Asked that he still try to collect stool sample for the culture - Rx Dicyclomine PRN for cramping discomfort - Rx Zofran ODT for nausea - May take existing Oxycodone PRN pain - If pain not improved or worsening bleeding other concerns on labs, needs to go directly to [facility] ED  Meds ordered this encounter  Medications  . ondansetron (ZOFRAN ODT) 4 MG disintegrating tablet    Sig: Take 1 tablet (4 mg total) every 8 (eight) hours as needed by mouth for nausea or vomiting.    Dispense:  30 tablet    Refill:  1  . dicyclomine (BENTYL) 10 MG capsule    Sig: Take 1 capsule (10 mg total) 4 (four) times daily -  before meals and at bedtime by mouth.    Dispense:  60 capsule    Refill:  1    Follow up plan: Return if symptoms worsen or fail to improve.  A total of 25 minutes was spent face-to-face with this patient. Greater than 50% of this time was spent in counseling on diverticulitis acute issues complications treatment limited options coordination of care with contacted patient's [facility] provider and reviewed case with updates, since last apt 01/06/17.  [name], DO [facility] 01/08/2017, 11:01 AM

## 2017-01-08 NOTE — Patient Instructions (Addendum)
Thank you for coming to the clinic today.  1.  Try dicyclomine for cramping and Zofran for nausea  Take oxycodone for pain as needed  Ross Min, MD  [place], [place] [address]  [phone]  [phone] (Fax)   Call [facility] and notify them of your symptoms, and may need to go to [facility] ED for further management may need IV antibiotics since oral antibiotics are not improving  Please schedule a Follow-up Appointment to: Return if symptoms worsen or fail to improve.  If you have any other questions or concerns, please feel free to call the clinic or send a message through [facility]. You may also schedule an earlier appointment if necessary.  Additionally, you may be receiving a survey about your experience at our clinic within a few days to 1 week by e-mail or mail. We value your feedback.  Ross Putnam, DO [place]

## 2017-01-08 NOTE — Telephone Encounter (Signed)
I spoke w/ Audelia Hives and Angela Nevin (the pt's daughter) and informed them of the lab results. They informed me that they was currently at [facility] ER and have been there since 12pm.

## 2017-01-08 NOTE — Telephone Encounter (Signed)
-----   Message from Olin Hauser, DO sent at 01/08/2017  4:27 PM EST ----- Please contact patient to review the following (No MyChart Access):  STAT Labs from today - overall essentially normal. No anemia. No elevated WBC. Normal Lipase (Pancreas).  If his symptoms are not improving he needs to go to [facility] ED as advised, unless his [place] doctor advised him of another plan.  [name], [facility] 01/08/2017, 4:27 PM

## 2017-01-12 ENCOUNTER — Telehealth: Payer: Self-pay | Admitting: Family Medicine

## 2017-01-12 NOTE — Telephone Encounter (Signed)
Ross Riley with [facility] needs a [facility] referral for follow up appt with Dr. Malen Gauze NPI [phone] on 12/2 diagnosis code D32.9 faxed to [phone].  Her call back number is [phone]

## 2017-01-13 NOTE — Telephone Encounter (Signed)
Asked Denis to help wit this approval ?

## 2017-01-15 NOTE — Telephone Encounter (Signed)
[facility] and as per rep. With reference # [id number] doesn't require prior approval to see specialist.

## 2017-01-21 ENCOUNTER — Ambulatory Visit: Payer: Medicare HMO | Admitting: Family Medicine

## 2017-01-21 ENCOUNTER — Encounter: Payer: Self-pay | Admitting: Family Medicine

## 2017-01-21 VITALS — BP 119/74 | HR 55 | Temp 98.4°F | Resp 16 | Ht 66.0 in | Wt 159.0 lb

## 2017-01-21 DIAGNOSIS — K5792 Diverticulitis of intestine, part unspecified, without perforation or abscess without bleeding: Secondary | ICD-10-CM | POA: Diagnosis not present

## 2017-01-21 DIAGNOSIS — A0472 Enterocolitis due to Clostridium difficile, not specified as recurrent: Secondary | ICD-10-CM

## 2017-01-21 NOTE — Progress Notes (Signed)
Subjective:    Patient ID: Ross Riley, male    DOB: 05-01-1950, 66 y.o.   MRN: [id number]  Ross Riley is a 66 y.o. male presenting on 01/21/2017 for Abdominal Pain (improved  but pt still has chills)  Patient provides history in Spanish today interpretation by daughter.  HPI  HOSPITAL FOLLOW-UP VISIT  Hospital/Location: [facility] Date of Admission: 01/08/17 Date of Discharge: 01/13/17  Reason for Admission: Abdominal Pain, Diverticulitis,  Primary (+Secondary) Diagnosis: Diverticulitis, C Diff Colitis  - Hospital H&P and Discharge Summary have been reviewed - Patient presents today 8 days  after recent hospitalization. Brief summary of recent course, patient had symptoms of chronic recurrent lower abdominal pain for >2 months, see multiple office visits, ED visits, and [facility] visit. Last visit with me on same day as admission 11/8 he was advised to contact GI and return to [facility] ED. - During hospital course, testing confirmed C Diff colitis, as he had multiple courses of antibiotics leading up to this for diverticulitis that resulted in significant improvement while taking the antibiotics, but symptoms would recur, he never completed the stool testing and culture that was ordered by GI before he returned to hospital. Treated with Vancomycin PO, given rx to finish and has completed this with dramatic resolution in his abdominal pain - His [facility] apt has been moved up and arranged for Colonoscopy 4-6 weeks to re-evaluate, as there was some area of colitis and abnormality on prior CT imaging  - Today reports overall has done well after discharge. Symptoms of abdominal pain nausea vomiting have resolved - Now still complains of chills with whole body rigors, had extensive evaluation at [facility] including labs, blood cultures, CT A/P without lymphadenopathy, CXR and still pending Echinococcus Ab (history of travel to [place]). Tylenol helps temporarily. Waiting for GI  follow-up.  I have reviewed the discharge medication list, and have reconciled the current and discharge medications today.   Current Outpatient Medications:  .  aspirin EC 81 MG tablet, Take by mouth., Disp: , Rfl:  .  atorvastatin (LIPITOR) 20 MG tablet, Take 1 tablet (20 mg total) by mouth daily., Disp: 90 tablet, Rfl: 0 .  benazepril-hydrochlorthiazide (LOTENSIN HCT) 10-12.5 MG tablet, Take 1 tablet by mouth daily., Disp: 90 tablet, Rfl: 3 .  BESIVANCE 0.6 % SUSP, Place 1 drop into the right eye 3 (three) times daily., Disp: , Rfl:  .  bimatoprost (LUMIGAN) 0.01 % SOLN, Place 1 drop into both eyes at bedtime., Disp: , Rfl:  .  brimonidine (ALPHAGAN P) 0.1 % SOLN, Apply to eye., Disp: , Rfl:  .  Calcium Citrate-Vitamin D 1000-400 LIQD, , Disp: , Rfl:  .  dorzolamide (TRUSOPT) 2 % ophthalmic solution, Apply to eye., Disp: , Rfl:  .  DUREZOL 0.05 % EMUL, Place 1 drop into the right eye 3 (three) times daily., Disp: , Rfl:  .  EFFERVESCENT PAIN RELIEF [phone] MG TBEF tablet, , Disp: , Rfl:  .  fluticasone (FLONASE) 50 MCG/ACT nasal spray, Place 2 sprays into both nostrils daily., Disp: 16 g, Rfl: 3 .  glipiZIDE (GLUCOTROL XL) 5 MG 24 hr tablet, Take 1 tablet (5 mg total) by mouth daily with breakfast., Disp: 90 tablet, Rfl: 3 .  ketorolac (ACULAR) 0.4 % SOLN, Apply to eye., Disp: , Rfl:  .  Lactobacillus Rhamnosus, GG, (CULTURELLE) CAPS, Take by mouth., Disp: , Rfl:  .  loratadine (CLARITIN) 10 MG tablet, Take 1 tablet (10 mg total) by  mouth daily., Disp: , Rfl:  .  metoprolol tartrate (LOPRESSOR) 25 MG tablet, Take 0.5 tablets (12.5 mg total) by mouth 2 (two) times daily., Disp: 90 tablet, Rfl: 3 .  naproxen (NAPROSYN) 500 MG tablet, Take 1 tablet (500 mg total) by mouth 2 (two) times daily with a meal. For 2-4 weeks then as needed, Disp: 60 tablet, Rfl: 2 .  nitazoxanide (ALINIA) 500 MG tablet, Take by mouth., Disp: , Rfl:  .  omeprazole (PRILOSEC) 20 MG capsule, Take 1 capsule (20 mg  total) by mouth daily., Disp: 90 capsule, Rfl: 3 .  ondansetron (ZOFRAN ODT) 4 MG disintegrating tablet, Take 1 tablet (4 mg total) every 8 (eight) hours as needed by mouth for nausea or vomiting., Disp: 30 tablet, Rfl: 1 .  oxyCODONE (OXY IR/ROXICODONE) 5 MG immediate release tablet, , Disp: , Rfl: 0 .  polyethylene glycol powder (GLYCOLAX/MIRALAX) powder, Take 17 g by mouth daily as needed., Disp: 250 g, Rfl: 3 .  PROLENSA 0.07 % SOLN, Place 1 drop into the left eye at bedtime., Disp: , Rfl: 1 .  timolol (TIMOPTIC) 0.25 % ophthalmic solution, INT 1 GTT IN OU QAM, Disp: , Rfl: 2 .  tiZANidine (ZANAFLEX) 2 MG tablet, , Disp: , Rfl: 0  ------------------------------------------------------------------------- Social History   Tobacco Use  . Smoking status: Former Smoker    Packs/day: 1.00    Years: 10.00    Pack years: 10.00    Types: Cigarettes    Last attempt to quit: 03/04/1991    Years since quitting: 25.9  . Smokeless tobacco: Never Used  Substance Use Topics  . Alcohol use: Yes    Alcohol/week: 0.0 oz    Comment: occasional  . Drug use: No    Review of Systems Per HPI unless specifically indicated above     Objective:    BP 119/74   Pulse (!) 55   Temp 98.4 F (36.9 C) (Oral)   Resp 16   Ht 5\' 6"  (1.676 m)   Wt 159 lb (72.1 kg)   SpO2 100%   BMI 25.66 kg/m   Wt Readings from Last 3 Encounters:  01/21/17 159 lb (72.1 kg)  01/08/17 153 lb (69.4 kg)  12/04/16 160 lb 6.4 oz (72.8 kg)    Physical Exam  Constitutional: He is oriented to person, place, and time. He appears well-developed and well-nourished. No distress.  Well appearing currently, improved, cooperative  HENT:  Head: Normocephalic and atraumatic.  Mouth/Throat: Oropharynx is clear and moist.  Improved now moist mucus mem  Eyes: Conjunctivae are normal. Right eye exhibits no discharge. Left eye exhibits no discharge.  Neck: Normal range of motion. Neck supple.  Cardiovascular: Normal rate, regular  rhythm, normal heart sounds and intact distal pulses.  No murmur heard. Pulmonary/Chest: Effort normal and breath sounds normal. No respiratory distress. He has no wheezes. He has no rales.  Abdominal: Soft. Bowel sounds are normal. He exhibits no distension and no mass. There is no tenderness (Resolved, lower abdomen LLQ). There is no rebound and no guarding.  Musculoskeletal: Normal range of motion. He exhibits no edema.  Has lower extremity ankle support brace on  Neurological: He is alert and oriented to person, place, and time.  Skin: Skin is warm and dry. No rash noted. He is not diaphoretic. No erythema.  Psychiatric: He has a normal mood and affect. His behavior is normal.  Well groomed, good eye contact, normal speech and thoughts  Nursing note and vitals reviewed.  Results  for orders placed or performed in visit on 01/08/17  COMPLETE METABOLIC PANEL WITH GFR  Result Value Ref Range   Glucose, Bld 112 (H) 65 - 99 mg/dL   BUN 10 7 - 25 mg/dL   Creat 0.77 0.70 - 1.25 mg/dL   GFR, Est Non African American 95 > OR = 60 mL/min/1.76m2   GFR, Est African American 110 > OR = 60 mL/min/1.72m2   BUN/Creatinine Ratio NOT APPLICABLE 6 - 22 (calc)   Sodium 138 135 - 146 mmol/L   Potassium 3.5 3.5 - 5.3 mmol/L   Chloride 100 98 - 110 mmol/L   CO2 29 20 - 32 mmol/L   Calcium 8.9 8.6 - 10.3 mg/dL   Total Protein 7.5 6.1 - 8.1 g/dL   Albumin 4.2 3.6 - 5.1 g/dL   Globulin 3.3 1.9 - 3.7 g/dL (calc)   AG Ratio 1.3 1.0 - 2.5 (calc)   Total Bilirubin 0.7 0.2 - 1.2 mg/dL   Alkaline phosphatase (APISO) 51 40 - 115 U/L   AST 14 10 - 35 U/L   ALT 13 9 - 46 U/L  CBC with Differential/Platelet  Result Value Ref Range   WBC 5.6 3.8 - 10.8 Thousand/uL   RBC 5.12 4.20 - 5.80 Million/uL   Hemoglobin 14.7 13.2 - 17.1 g/dL   HCT 43.7 38.5 - 50.0 %   MCV 85.4 80.0 - 100.0 fL   MCH 28.7 27.0 - 33.0 pg   MCHC 33.6 32.0 - 36.0 g/dL   RDW 13.0 11.0 - 15.0 %   Platelets 252 140 - 400 Thousand/uL   MPV  10.3 7.5 - 12.5 fL   Neutro Abs 1,859 1,500 - 7,800 cells/uL   Lymphs Abs 2,934 850 - 3,900 cells/uL   WBC mixed population 728 200 - 950 cells/uL   Eosinophils Absolute 50 15 - 500 cells/uL   Basophils Absolute 28 0 - 200 cells/uL   Neutrophils Relative % 33.2 %   Total Lymphocyte 52.4 %   Monocytes Relative 13.0 %   Eosinophils Relative 0.9 %   Basophils Relative 0.5 %  Lipase  Result Value Ref Range   Lipase 22 7 - 60 U/L      Assessment & Plan:   Problem List Items Addressed This Visit    Diverticulitis    Other Visit Diagnoses    C. difficile colitis    -  Primary      Reassurance today with dramatic improvement after hospitalization and treatment for C Diff colitis, now still has residual chills, that onset over past 1-2 months ago, still present. - Finished oral Vancomycin to complete C Diff course - Counseling on etiology Diverticulitis vs Diverticulosis and C Diff, secondary to antibiotics - Recommend continue follow-up with [facility] as planned Dr Bayard Hugger in 02/2016 for colonoscopy, due to prior abnormality inflammation seen on CT, concern other etiology underlying for his chills - If unresolved may consider ID referral - Also pending Echinococcus ab pending from [facility] labs  Handout / Letter typed with all current med list and what they are for, to be mailed to patient on request   No orders of the defined types were placed in this encounter.   Follow up plan: Return in about 4 weeks (around 02/18/2017) for chills (s/p c diff, diverticulitis).  [name], DO [facility] 01/21/2017, 8:43 PM

## 2017-01-21 NOTE — Patient Instructions (Addendum)
Thank you for coming to the clinic today.  1. Keep your appointment for Colonoscopy 02/06/17 with Dr Bayard Hugger at [facility]  They may be able to locate any source of information about your chills on the scope and treat if possible.  If we cannot find any answer and symptom continues then we may consider referral to Infectious Disease referral.  C diff has been treated, this happened after your diverticulitis was treated with antibiotics.  Please schedule a Follow-up Appointment to: Return in about 4 weeks (around 02/18/2017) for chills (s/p c diff, diverticulitis).  If you have any other questions or concerns, please feel free to call the clinic or send a message through [facility]. You may also schedule an earlier appointment if necessary.  Additionally, you may be receiving a survey about your experience at our clinic within a few days to 1 week by e-mail or mail. We value your feedback.  [name], DO [place]

## 2017-02-03 ENCOUNTER — Telehealth: Payer: Self-pay | Admitting: Nurse Practitioner

## 2017-02-03 ENCOUNTER — Other Ambulatory Visit: Payer: Self-pay | Admitting: Family Medicine

## 2017-02-03 ENCOUNTER — Other Ambulatory Visit: Payer: Self-pay

## 2017-02-03 DIAGNOSIS — R1032 Left lower quadrant pain: Secondary | ICD-10-CM

## 2017-02-03 DIAGNOSIS — K5909 Other constipation: Secondary | ICD-10-CM

## 2017-02-03 NOTE — Telephone Encounter (Signed)
Rx send and also advised that pharmacy might declined since it's OTC and called GI office to get preparation kit before getting colonoscopy done daughter will call [facility].

## 2017-02-03 NOTE — Telephone Encounter (Signed)
Pt needs prescription for polyethglycol powder sent to pharmacy for upcoming colonoscopy.  Ross Riley's call back number is [phone]

## 2017-02-05 ENCOUNTER — Telehealth: Payer: Self-pay | Admitting: Family Medicine

## 2017-02-05 DIAGNOSIS — E785 Hyperlipidemia, unspecified: Secondary | ICD-10-CM

## 2017-02-05 MED ORDER — ATORVASTATIN CALCIUM 20 MG PO TABS: 20.0000 mg | ORAL_TABLET | Freq: Every day | ORAL | 3 refills | Status: DC

## 2017-02-05 NOTE — Telephone Encounter (Signed)
Pt needs a refill on atorvastatin sent to [facility] in [place]

## 2017-02-05 NOTE — Telephone Encounter (Signed)
Refilled rx  [name], DO [facility] 02/05/2017, 12:37 PM

## 2017-02-06 DIAGNOSIS — Z8719 Personal history of other diseases of the digestive system: Secondary | ICD-10-CM | POA: Diagnosis not present

## 2017-02-06 DIAGNOSIS — D126 Benign neoplasm of colon, unspecified: Secondary | ICD-10-CM | POA: Diagnosis not present

## 2017-02-06 DIAGNOSIS — K529 Noninfective gastroenteritis and colitis, unspecified: Secondary | ICD-10-CM | POA: Diagnosis not present

## 2017-02-06 DIAGNOSIS — K5732 Diverticulitis of large intestine without perforation or abscess without bleeding: Secondary | ICD-10-CM | POA: Diagnosis not present

## 2017-02-06 DIAGNOSIS — D123 Benign neoplasm of transverse colon: Secondary | ICD-10-CM | POA: Diagnosis not present

## 2017-02-06 DIAGNOSIS — K5730 Diverticulosis of large intestine without perforation or abscess without bleeding: Secondary | ICD-10-CM | POA: Diagnosis not present

## 2017-02-06 DIAGNOSIS — K635 Polyp of colon: Secondary | ICD-10-CM | POA: Diagnosis not present

## 2017-02-06 DIAGNOSIS — A0471 Enterocolitis due to Clostridium difficile, recurrent: Secondary | ICD-10-CM | POA: Diagnosis not present

## 2017-02-06 LAB — HM COLONOSCOPY

## 2017-02-10 DIAGNOSIS — R933 Abnormal findings on diagnostic imaging of other parts of digestive tract: Secondary | ICD-10-CM | POA: Diagnosis not present

## 2017-02-10 DIAGNOSIS — R1032 Left lower quadrant pain: Secondary | ICD-10-CM | POA: Diagnosis not present

## 2017-02-11 DIAGNOSIS — D320 Benign neoplasm of cerebral meninges: Secondary | ICD-10-CM | POA: Diagnosis not present

## 2017-02-11 DIAGNOSIS — D329 Benign neoplasm of meninges, unspecified: Secondary | ICD-10-CM | POA: Diagnosis not present

## 2017-02-16 DIAGNOSIS — R10817 Generalized abdominal tenderness: Secondary | ICD-10-CM | POA: Diagnosis not present

## 2017-02-16 DIAGNOSIS — R1032 Left lower quadrant pain: Secondary | ICD-10-CM | POA: Diagnosis not present

## 2017-02-16 DIAGNOSIS — R197 Diarrhea, unspecified: Secondary | ICD-10-CM | POA: Diagnosis not present

## 2017-02-16 DIAGNOSIS — Z87891 Personal history of nicotine dependence: Secondary | ICD-10-CM | POA: Diagnosis not present

## 2017-02-18 ENCOUNTER — Telehealth: Payer: Self-pay | Admitting: Family Medicine

## 2017-02-18 ENCOUNTER — Ambulatory Visit: Payer: Medicare HMO | Admitting: Family Medicine

## 2017-02-18 ENCOUNTER — Encounter: Payer: Self-pay | Admitting: Family Medicine

## 2017-02-18 VITALS — BP 129/70 | HR 52 | Temp 98.5°F | Resp 16 | Ht 66.0 in | Wt 157.0 lb

## 2017-02-18 DIAGNOSIS — A0471 Enterocolitis due to Clostridium difficile, recurrent: Secondary | ICD-10-CM

## 2017-02-18 DIAGNOSIS — R1032 Left lower quadrant pain: Secondary | ICD-10-CM | POA: Diagnosis not present

## 2017-02-18 DIAGNOSIS — N529 Male erectile dysfunction, unspecified: Secondary | ICD-10-CM

## 2017-02-18 MED ORDER — SILDENAFIL CITRATE 20 MG PO TABS: ORAL_TABLET | ORAL | 1 refills | Status: DC

## 2017-02-18 NOTE — Progress Notes (Signed)
Subjective:    Patient ID: Ross Riley, male    DOB: 23-Apr-1950, 66 y.o.   MRN: [id number]  Ross Riley is a 66 y.o. male presenting on 02/18/2017 for Hospitalization Follow-up (abdominal pain, diarrhea)  HPI  ED FOLLOW-UP VISIT  Hospital/Location: [facility] Date of ED Visit: 02/16/17  Reason for Presenting to ED: Diarrhea, Abdominal Pain Primary (+Secondary) Diagnosis: Recurrent C Diff colitis  FOLLOW-UP  - ED provider note and record have been reviewed - Patient presents today about 2 days after recent ED visit. Brief summary of recent course, patient had symptoms of recurrent abdominal pain cramping onset now for 3 days with diarrhea, similar to prior C Diff, with prior history most recently hospitalized 01/08/17 at [facility] for C Diff ultimately dx and treated for C Diff, completed Vancomycin PO for 10 days, finished 01/18/17. He has seen [facility] since and had colonoscopy, has report today see below, presented to ED on 12/17, testing in ED with labs, and CT imaging, treated with IV antibiotics Cipro/Flagyl initially, and then discharged on PO Cipro/Flagyl, and there was concern about recurrent C Diff. He was later contacted by his [facility] Dr Bayard Hugger on 12/18 and advised to NOT take Cipro/Flagyl and instead start new medicine antibiotic due to recurrence, rx Dificid 200mg  BID x 10 days was sent to his pharmacy, reviewed outside chart and phone record - Today reports overall has done well after discharge from ED. Symptoms of abdominal pain and diarrhea have resolved - He did not start either Cipro/Flagyl antibiotic as advised. He explains some confusion with this. He attempted to go to pharmacy this morning but they did not have the rx ready. He is asking what to take next - New medications on discharge: Dificid 200mg  BID x 10 days (Discontinued Cipro/Flagyl) - Denies any chills, fever - states this has resolved - Denies nausea vomiting, abdominal pain, diarrhea, dyspnea,  chest pain  Additional complaint: - History of Erectile dysfunction - requesting new rx viagra or generic, he has been on this before with good results, stated viagra was too expensive, requesting generic, states not for now but he will like to resume this in the near future. States chronic problem.   Depression screen [facility] 2/9 11/24/2016 06/05/2016 01/12/2015  Decreased Interest 0 0 0  Down, Depressed, Hopeless 0 0 0  PHQ - 2 Score 0 0 0    Social History   Tobacco Use  . Smoking status: Former Smoker    Packs/day: 1.00    Years: 10.00    Pack years: 10.00    Types: Cigarettes    Last attempt to quit: 03/04/1991    Years since quitting: 25.9  . Smokeless tobacco: Never Used  Substance Use Topics  . Alcohol use: Yes    Alcohol/week: 0.0 oz    Comment: occasional  . Drug use: No    Review of Systems Per HPI unless specifically indicated above     Objective:    BP 129/70   Pulse (!) 52   Temp 98.5 F (36.9 C) (Oral)   Resp 16   Ht 5\' 6"  (1.676 m)   Wt 157 lb (71.2 kg)   BMI 25.34 kg/m   Wt Readings from Last 3 Encounters:  02/18/17 157 lb (71.2 kg)  01/21/17 159 lb (72.1 kg)  01/08/17 153 lb (69.4 kg)    Physical Exam  Constitutional: He is oriented to person, place, and time. He appears well-developed and well-nourished. No distress.  Well-appearing,  comfortable, cooperative  HENT:  Head: Normocephalic and atraumatic.  Mouth/Throat: Oropharynx is clear and moist.  Eyes: Conjunctivae are normal. Right eye exhibits no discharge. Left eye exhibits no discharge.  Neck: Normal range of motion. Neck supple.  Cardiovascular: Normal rate, regular rhythm, normal heart sounds and intact distal pulses.  No murmur heard. Pulmonary/Chest: Effort normal.  Abdominal: Soft. Bowel sounds are normal. He exhibits no distension and no mass. There is no tenderness. There is no rebound and no guarding.  Musculoskeletal: He exhibits no edema.  Neurological: He is alert and oriented  to person, place, and time.  Skin: Skin is warm and dry. No rash noted. He is not diaphoretic. No erythema.  Psychiatric: His behavior is normal.  Nursing note and vitals reviewed.    I have personally reviewed the radiology report from [place] ED outside record, on 02/16/17 - CT Abdomen / Pelvis w/ IV contrast  CT abdomen and pelvis with IV contrast  Comparison: 01/08/2017.  Indication: LLQ pain, R10.32 Left lower quadrant pain.  Technique: CT imaging was performed of the abdomen and pelvis following the uncomplicated administration of intravenous contrast (Isovue-300, 150 mL). Iodinated contrast was used due to the indications for the examination, to improve disease detection and further define anatomy. The most recent serum creatinine is 0.9 mg/dL. Coronal and sagittal reformatted images were generated and reviewed.  Findings:  Lung bases are clear. No pleural or pericardial effusion seen. Heart is normal in size.  Normal size and contour of liver. No suspicious liver lesions seen. Unchanged benign hemangioma is. Hepatic steatosis. Portal and hepatic veins are patent. No intra or extrahepatic biliary ductal dilatation seen. Gallbladder appears unremarkable. Bilateral adrenal glands, spleen, pancreas, and bilateral kidneys are within normal limits. No hydronephrosis. Urinary bladder is unremarkable appearance. Nonobstructive bowel gas pattern. Mild hyperemia and wall-thickening of the transverse colon (series 2, image 50). Revisualized extensive sigmoid and descending colon diverticulosis but without definite diverticulitis. Again seen mild thickening and hypervascularity along the long segment of the sigmoid colon or rectum, although less prominent compared to prior CT dated 01/08/2017. Abdominal aorta is within normal limits. No abdominal or pelvic adenopathy seen. Small bilateral inguinal fat-containing hernias. Small hiatal hernia.  No aggressive bone lesions  seen.  Impression: Mild colonic wall thickening and hyperemia of transverse colon. Persistent although less prominent long segment colonic wall thickening of the rectosigmoid colon. Findings consistent with mild colitis (infectious, inflammatory or ischemic etiologies).   Discussed 02/16/2017 1:19 PM by Ulla Potash, MD, [facility] with Dr. Ocie Doyne [facility].   Results for orders placed or performed in visit on 02/18/17  Hemoglobin A1c  Result Value Ref Range   Hemoglobin A1C 6.3   HM COLONOSCOPY  Result Value Ref Range   HM Colonoscopy See Report (in chart) See Report (in chart), Patient Reported      Assessment & Plan:   Problem List Items Addressed This Visit    Abdominal pain, LLQ (left lower quadrant)   ED (erectile dysfunction) of organic origin  Consistent with likely age-related ED, also history of DM  Plan: 1. Trial on generic Sildenafil 20mg  tabs, take 1-5 tabs about 30 min prior to sexual activity, printed rx he may fill when ready 2. Follow-up as needed    Relevant Medications   sildenafil (REVATIO) 20 MG tablet    Other Visit Diagnoses    Recurrent colitis due to Clostridium difficile    -  Primary  Significantly improved s/p ED visit and IV antibiotics, however concern  with recent recurrence of C Diff most likely based on history and recent flare up presentation - Asymptomatic, benign abdomen, afebrile - Reviewed recent [place] ED visit, reviewed Dr Bayard Hugger recent GI records per above - Last colonoscopy 02/06/17 copy of result showed one tubular adenoma polyp removed, and healing areas of acute colitis likely from prior C Diff colitis  Plan: 1. Reassurance given now improved, however expressed concern may be at risk for recurrence again if not re-treated as per GI recommendations - Clarified - he is NOT supposed to take Cipro/Flagyl per [place] [facility] - He is supposed to take NEW med Dificid 200mg  BID x 10 days for recurrence (after vanc PO), per Dr  Bayard Hugger, spoke with [place] and patient after visit and this was too expensive for him and required a PA, that was sent to Dr Bayard Hugger. I called Dr Reesa Chew office to review this and notify them, and stated that patient may need the oral vancomycin pulse taper regimen, and requested that they prescribe it if needed, they will contact Dr Bayard Hugger and proceed with rx of either vanc or PA for Dificid, patient aware that no rx likely today and plan to try to pick up tomorrow he may follow-up with them as needed    Relevant Medications   fidaxomicin (DIFICID) 200 MG TABS tablet         Meds ordered this encounter  Medications  . sildenafil (REVATIO) 20 MG tablet    Sig: Take 1-5 pills about 30 min prior to sex. Start with 1 and increase as needed.    Dispense:  20 tablet    Refill:  1    Follow up plan: Return in about 3 months (around 05/19/2017) for DM A1c.  [name], [facility] 02/18/2017, 2:39 PM

## 2017-02-18 NOTE — Telephone Encounter (Signed)
Pt. Daughter called have questions about pt.  Medication. Pt  Call back  # is  [phone]

## 2017-02-18 NOTE — Telephone Encounter (Signed)
Spoke with daughter, Ross Riley. She was the one who spoke with Dr Bayard Hugger on 02/17/17 and was aware of this initial plan, she just wants to clarify now after speaking with her father. They were waiting on the new vancomycin rx.  Brief summary of my updates are summarized from my note below:  He is supposed to take NEW med Dificid 200mg  BID x 10 days for recurrence (after vanc PO), per Dr Bayard Hugger, spoke with Ross Riley and patient after visit and this was too expensive for him and required a PA, that was sent to Dr Bayard Hugger. I called Dr Reesa Chew office to review this and notify them, and stated that patient may need the oral vancomycin pulse taper regimen, and requested that they prescribe it if needed, they will contact Dr Bayard Hugger and proceed with rx of either vanc or PA for Dificid, patient aware that no rx likely today and plan to try to pick up tomorrow he may follow-up with them as needed  She is now aware that most likely it will be oral Vancomycin for several weeks per Dr Bayard Hugger. I do not know if this rx was sent or when it will be, but Dr Reesa Chew office is aware and working on it. They may also receive the PA and possibly get Difcid approved. This is still a potential option.  If by Friday still no rx and not able to proceed from Dr Bayard Hugger, I offered to provide the first 2 weeks of Vancomycin therapy rx if needed as a back up plan.  Ross Riley, [facility] 02/18/2017, 4:46 PM

## 2017-02-18 NOTE — Patient Instructions (Addendum)
Thank you for coming to the clinic today.  1. I have reviewed the Chart from [place] and spoke with [place].  Do NOT take Ciprofloxacin or Flagyl (Metronidazole) antibiotics.  The NEW antibiotic that was prescribed by Dr Bayard Hugger ([facility]) is Difcid (Fidaxomicin) 200mg  - take one pill TWICE daily for 10 days. This rx is at your [facility] in [place]. They are still trying to process it, and it is not ready now, they said to return to pharmacy maybe within 1-2 hours, early this afternoon.  If the price is extremely high, and you cannot afford this medicine. You or the pharmacy will need to contact myself or Dr Bayard Hugger office, and we can prescribe another course of Vancomycin oral  I would prefer that you call Dr Bayard Hugger first as they are more familiar with prescribing this, if you cannot reach them then you may call us.  Vancomycin pulsed-tapered regimen: 125 mg orally four times daily for 10 to 14 days, then 125 mg orally twice daily for 7 days, then 125 mg orally once daily for 7 days, then 125 mg orally every 2 or 3 days for 2 to 8 weeks  If dramatic worsening symptoms abdominal pain, diarrhea, fever chills return then can contact Dr Bayard Hugger office or go back to hospital.  For colonoscopy, one polyp, benign, you are good for 5 years until next due.   Please schedule a Follow-up Appointment to: Return in about 3 months (around 05/19/2017) for DM A1c.  If you have any other questions or concerns, please feel free to call the clinic or send a message through [place]. You may also schedule an earlier appointment if necessary.  Additionally, you may be receiving a survey about your experience at our clinic within a few days to 1 week by e-mail or mail. We value your feedback.  [name], DO [place]    ------------  1. He revisado la [facility]abla de [facility] y habl con [employer].  NO tome antibiticos de ciprofloxacina o de flagil  (metronidazol).  El NUEVO antibitico que le recet el Dr. Bayard Hugger ([facility]) es Difcid (Fidaxomicina) 200 mg. [place] [facility] [place] [address]. Este rx est en su farmacia [facility] en [place]. Todava estn tratando de procesarlo, y ahora no est listo, dijeron que regresar a la farmacia tal vez dentro de 1 a 2 horas, temprano esta tarde.  Si el precio es Investment banker, operational y no puede Programmer, applications. Usted o la farmacia debern ponerse en contacto conmigo o con la oficina del Dr. Bayard Hugger, y podemos prescribir otro curso de Vancomycin oral  Preferira que primero llame al Dr. Bayard Hugger ya que estn ms familiarizados con la prescripcin de esto, si no puede comunicarse con ellos, entonces puede llamarnos.  Vancomicina rgimen de pulso cnico: 125 mg por va oral cuatro veces al da durante 10 a 14 das, luego 125 mg por va oral [place] [address], luego 125 mg por va oral una vez al da durante 7 [place], luego 125 mg por va oral cada 2 o 3 das durante 2 a 8 semanas  Si los sntomas empeoran dramticamente, el dolor abdominal, la diarrea, los escalofros de fiebre regresan, entonces puede contactar a la oficina del Dr. Bayard Hugger o regresar al hospital.

## 2017-03-17 ENCOUNTER — Telehealth: Payer: Self-pay | Admitting: Family Medicine

## 2017-03-17 DIAGNOSIS — H401132 Primary open-angle glaucoma, bilateral, moderate stage: Secondary | ICD-10-CM | POA: Diagnosis not present

## 2017-03-17 NOTE — Telephone Encounter (Signed)
Called pt to sched for AWV with Nurse Health Advisor. [facility] 67 on 04/05/17 sched [id number]   C/b #  [phone] on Skype [email or web address] if you have questions

## 2017-03-25 ENCOUNTER — Encounter: Payer: Self-pay | Admitting: Family Medicine

## 2017-03-25 ENCOUNTER — Ambulatory Visit (INDEPENDENT_AMBULATORY_CARE_PROVIDER_SITE_OTHER): Payer: Medicare HMO | Admitting: Family Medicine

## 2017-03-25 ENCOUNTER — Other Ambulatory Visit: Payer: Self-pay | Admitting: Family Medicine

## 2017-03-25 VITALS — BP 125/79 | HR 51 | Temp 98.5°F | Resp 16 | Ht 66.0 in | Wt 157.0 lb

## 2017-03-25 DIAGNOSIS — A0471 Enterocolitis due to Clostridium difficile, recurrent: Secondary | ICD-10-CM

## 2017-03-25 DIAGNOSIS — Z96652 Presence of left artificial knee joint: Secondary | ICD-10-CM

## 2017-03-25 DIAGNOSIS — R6883 Chills (without fever): Secondary | ICD-10-CM

## 2017-03-25 MED ORDER — NAPROXEN 500 MG PO TABS: 500.0000 mg | ORAL_TABLET | Freq: Two times a day (BID) | ORAL | 1 refills | Status: DC

## 2017-03-25 NOTE — Assessment & Plan Note (Addendum)
Persistent problem with chills, poorly defined without other clear symptoms seems related to underlying C Diff, however present prior to recent recurrence and seems to not be improved after. - See A&P C Diff, on Vanco taper regimen now for up to 8 more weeks - Other differential: recent surgery and manipulation of L knee, possible etiology however timeline is confusing as present before this, other history of parasitic infection or exposure, from [place] has reported this before but take prophylactic antibiotics has had some chronic GI pain for long time, prior testing ova/parasite has been negative  Plan: 1. Referral to [facility] for 2nd opinion on refractory chills - Trial on Naproxen 500 BID for 2-4 weeks to reduce potential inflammatory from C Diff, continue Tylenol intermittent PRN dosing to control symptoms for now, may need more time on Vanco course

## 2017-03-25 NOTE — Assessment & Plan Note (Signed)
Persistent stiffness with limited range and some pain s/p TKR per [facility] Followed with outpatient PT  Plan: 1. Referral to [facility] for 2nd opinion per patient request - would like to see same provider that his wife saw, Dorise Hiss PA, see referral note

## 2017-03-25 NOTE — Progress Notes (Signed)
Subjective:    Patient ID: Ross Riley, male    DOB: Mar 11, 1950, 67 y.o.   MRN: [id number]  Ross Riley is a 67 y.o. male presenting on 03/25/2017 for Chills (arms and feets cold,  still feels inflamation in colon as per patient)  Patient presents for a same day appointment.  HPI   FOLLOW-UP CHILLS / Recurrent C Diff Colitis: - Last visit with me 02/18/17, for hospital follow-up for same problem, after recent [place] ED visit and GI follow-up he had colonoscopy 12/7 and confirmed healing C diff colitis areas, he was given repeat course antibiotics but then changed by Dr Beryl Meager GI to Dificid due to recurrence but unable to be authorized therefore switched to Vancomycin prolonged pulse/couse for tapering dose over few weeks then long course up to 8 weeks intermittent dosing, see prior notes for background information. - Interval update with patient contacted his GI specialist Dr Bayard Hugger on 03/24/17, regarding symptoms with still having chills, having 1-2 stools daily, improved consistency no longer loose, and no LLQ pain,  - Today patient reports his GI symptoms have mostly resolved. No further abdominal pain. No longer has diarrhea. However, he is returning due to persistent "chills", he endorsed these symptoms prior to most recent flare up and during and still now after, describes chill sensation mostly in chest and abdomen and R arm and L leg, not necessarily whole body - He is still taking Vancoymcin pulsed therapy now on the intermittent dosing every 2-3 days now for total of 8 weeks, it does not seem to help his current chill symptoms - Improved with Tylenol intermittent dosing 3 times daily with relief but not long lasting, not taking NSAID - Admits some gas within abdomen but denies pain - Admits some chills and shakes vs rigors at time  - Denies active abdominal pain, diarrhea, loose stool, blood in stool, dark stool, fevers or sweats, nausea vomiting, cough, dysuria, skin redness  or infection  Left Knee Pain s/p surgery Followed by [facility], Dr Kerby Nora, s/p Left TKR surgery approximately 08/2016, last visit was 12/18/16 about 6 weeks after repeat procedure L knee manipulation under anesthesia, he has continued stiffness and discomfort, has DM neuropathy and chronic L ankle dystonia with AFO use with brace on L lower extremity foot. - Today he requests a 2nd opinion from another Orthopedic specialist, he would like to go to [place], his wife had similar problem with a knee surgery by Dr Lorri Frederick by his report and he states that she was referred to Rachelle Hora PA at [place] and her treatment course has changed now, he would like to see them as well - He is frustrated with still having stiffness and difficulty with heaviness at times in his Left leg he attributes to his knee surgery - Admits some swelling of knee - Denies redness, drainage, injury fall trauma  Depression screen [facility] 2/9 11/24/2016 06/05/2016 01/12/2015  Decreased Interest 0 0 0  Down, Depressed, Hopeless 0 0 0  PHQ - 2 Score 0 0 0    Social History   Tobacco Use  . Smoking status: Former Smoker    Packs/day: 1.00    Years: 10.00    Pack years: 10.00    Types: Cigarettes    Last attempt to quit: 03/04/1991    Years since quitting: 26.0  . Smokeless tobacco: Never Used  Substance Use Topics  . Alcohol use: Yes    Alcohol/week: 0.0 oz  Comment: occasional  . Drug use: No    Review of Systems Per HPI unless specifically indicated above     Objective:    BP 125/79   Pulse (!) 51   Temp 98.5 F (36.9 C) (Oral)   Resp 16   Ht 5\' 6"  (1.676 m)   Wt 157 lb (71.2 kg)   BMI 25.34 kg/m   Wt Readings from Last 3 Encounters:  03/25/17 157 lb (71.2 kg)  02/18/17 157 lb (71.2 kg)  01/21/17 159 lb (72.1 kg)    Physical Exam  Constitutional: He is oriented to person, place, and time. He appears well-developed and well-nourished. No distress.  Well-appearing,  comfortable, cooperative  HENT:  Head: Normocephalic and atraumatic.  Mouth/Throat: Oropharynx is clear and moist.  Eyes: Conjunctivae are normal. Right eye exhibits no discharge. Left eye exhibits no discharge.  Neck: Normal range of motion. Neck supple.  Cardiovascular: Normal rate, regular rhythm, normal heart sounds and intact distal pulses.  No murmur heard. Pulmonary/Chest: Effort normal and breath sounds normal. No respiratory distress. He has no wheezes. He has no rales.  Abdominal: Soft. Bowel sounds are normal. He exhibits no distension and no mass. There is no tenderness. There is no rebound and no guarding.  Musculoskeletal: He exhibits no edema.  Mild tender L knee anterior bilateral, minimal effusion vs edema, no erythema, without deformity  Lymphadenopathy:    He has no cervical adenopathy.  Neurological: He is alert and oriented to person, place, and time.  Skin: Skin is warm and dry. No rash noted. He is not diaphoretic. No erythema.  Psychiatric: He has a normal mood and affect. His behavior is normal.  Well groomed, good eye contact, normal speech and thoughts  Nursing note and vitals reviewed.  Results for orders placed or performed in visit on 02/18/17  Hemoglobin A1c  Result Value Ref Range   Hemoglobin A1C 6.3   HM COLONOSCOPY  Result Value Ref Range   HM Colonoscopy See Report (in chart) See Report (in chart), Patient Reported      Assessment & Plan:   Problem List Items Addressed This Visit    Chills (without fever)    Persistent problem with chills, poorly defined without other clear symptoms seems related to underlying C Diff, however present prior to recent recurrence and seems to not be improved after. - See A&P C Diff, on Vanco taper regimen now for up to 8 more weeks - Other differential: recent surgery and manipulation of L knee, possible etiology however timeline is confusing as present before this, other history of parasitic infection or exposure,  from [place] has reported this before but take prophylactic antibiotics has had some chronic GI pain for long time, prior testing ova/parasite has been negative  Plan: 1. Referral to [facility] Infect[facility] Disease for 2nd opinion on refractory chills - Trial on Naproxen 500 BID for 2-4 weeks to reduce potential inflammatory from C Diff, continue Tylenol intermittent PRN dosing to control symptoms for now, may need more time on Vanco course      Relevant Orders   Ambulatory referral to Infectious Disease   Recurrent colitis due to Clostridium difficile - Primary    Resolving C Diff symptoms, after recurrent infection and persistent symptoms On 2nd course of PO vancomycin, now long duration pulse/taper per [facility] However concern with persistent chills, seems related to C Diff or infectious etiology, despite afebrile and resolving other GI symptoms. He did have some similar chills prior to this  but thought to be related to diverticulitis or initial C Diff - No further treatment other than prolonged vanco per GI at this time now up to 8 weeks intermittent dosing listed below:  Vancomycin pulsed-tapered regimen: 125 mg orally four times daily for 10 to 14 days, then 125 mg orally twice daily for 7 days, then 125 mg orally once daily for 7 days, then 125 mg orally every 2 or 3 days for 2 to 8 weeks  Plan: 1. Seems resolving C Diff, uncertain why still chills - patient was sent from GI - Recommend to continue current treatment with Vanco - Advise may add Naproxen in addition to Tylenol to control chills if infectious - Referral to Infectious Disease [facility] for 2nd opinion on chills and other possible etiology      Relevant Medications   vancomycin (VANCOCIN) 125 MG capsule   Other Relevant Orders   Ambulatory referral to Infectious Disease   S/P TKR (total knee replacement), left    Persistent stiffness with limited range and some pain s/p TKR per [facility] Followed with outpatient  PT  Plan: 1. Referral to [facility] for 2nd opinion per patient request - would like to see same provider that his wife saw, Dorise Hiss PA, see referral note      Relevant Orders   Ambulatory referral to Orthopedic Surgery      Meds ordered this encounter  Medications  . DISCONTD: naproxen (NAPROSYN) 500 MG tablet    Sig: Take 1 tablet (500 mg total) by mouth 2 (two) times daily with a meal. For 2-4 weeks then as needed    Dispense:  60 tablet    Refill:  1   Orders Placed This Encounter  Procedures  . Ambulatory referral to Infectious Disease    Referral Priority:   Routine    Referral Type:   Consultation    Referral Reason:   Specialty Services Required    Referred to Provider:   Leonel Ramsay, MD    Requested Specialty:   Infectious Diseases    Number of Visits Requested:   1  . Ambulatory referral to Orthopedic Surgery    Referral Priority:   Routine    Referral Type:   Surgical    Referral Reason:   Specialty Services Required    Referred to Provider:   Duanne Guess, PA-C    Requested Specialty:   Orthopedic Surgery    Number of Visits Requested:   1    Follow up plan: Return if symptoms worsen or fail to improve, for chills - otherwise keep apt in March.  [name], [facility] 03/25/2017, 11:37 PM

## 2017-03-25 NOTE — Patient Instructions (Addendum)
Thank you for coming to the office today.  1. I do not have a clear answer for the Chills  It may be still due to the C Diff and the treatment for this - it could take longer to resolve until you finish the therapy  For now - keep taking the Tylenol   Recommend to start taking Tylenol Extra Strength 500mg  tabs - take 1 to 2 tabs per dose (max 1000mg ) every 6-8 hours for chills - max 24 hour daily dose is 6 tablets or 3000mg .   Recommend trial of Anti-inflammatory with Naproxen (Naprosyn) 500mg  tabs - take one with food and plenty of water TWICE daily every day (breakfast and dinner), for next 2 to 4 weeks, then you may take only as needed - DO NOT TAKE any ibuprofen, aleve, motrin while you are taking this medicine  INFECTIOUS DISEASE (ID)  [facility] - Infectious Disease Address: [place], [place], [place] [address] Phone: [phone]  Dr Adrian Prows  ------------------------------  [facility] [place], [place]  [address] Phone: [phone]  T. Rachelle Hora, PA-C  Physician Assistant   Please schedule a Follow-up Appointment to: Return if symptoms worsen or fail to improve, for chills - otherwise keep apt in March.    If you have any other questions or concerns, please feel free to call the office or send a message through [place]. You may also schedule an earlier appointment if necessary.  Additionally, you may be receiving a survey about your experience at our office within a few days to 1 week by e-mail or mail. We value your feedback.  [name], DO [place]

## 2017-03-25 NOTE — Assessment & Plan Note (Signed)
Resolving C Diff symptoms, after recurrent infection and persistent symptoms On 2nd course of PO vancomycin, now long duration pulse/taper per [facility] However concern with persistent chills, seems related to C Diff or infectious etiology, despite afebrile and resolving other GI symptoms. He did have some similar chills prior to this but thought to be related to diverticulitis or initial C Diff - No further treatment other than prolonged vanco per GI at this time now up to 8 weeks intermittent dosing listed below:  Vancomycin pulsed-tapered regimen: 125 mg orally four times daily for 10 to 14 days, then 125 mg orally twice daily for 7 days, then 125 mg orally once daily for 7 days, then 125 mg orally every 2 or 3 days for 2 to 8 weeks  Plan: 1. Seems resolving C Diff, uncertain why still chills - patient was sent from GI - Recommend to continue current treatment with Vanco - Advise may add Naproxen in addition to Tylenol to control chills if infectious - Referral to Infectious Disease [facility] for 2nd opinion on chills and other possible etiology

## 2017-03-30 ENCOUNTER — Telehealth: Payer: Self-pay | Admitting: Family Medicine

## 2017-03-30 NOTE — Telephone Encounter (Signed)
Pt's daughter, Dedra Skeens said the tylenol is not helping with pt's chills.  Please call [phone]

## 2017-03-31 NOTE — Telephone Encounter (Signed)
I referred him to [facility] Infectious Disease Dr Ola Spurr on 03/25/17 at time of our last office visit to discuss his chills more and review treatment options for a second opinion.  Unfortunately, I do not have any other anti-fever/chills medicines available. He has tried most options.  I do not see that he is scheduled yet for [facility]. My best recommendation would be to establish with them to help with further treatment options.  If you could check on status of this referral. Thanks  [name], [facility] 03/31/2017, 7:00 AM

## 2017-03-31 NOTE — Telephone Encounter (Signed)
Patient's daughter advised pt has appointment on 04/27/17 at 9:30 am with Dr. Ola Spurr.

## 2017-03-31 NOTE — Telephone Encounter (Signed)
Left message for patient to call back  

## 2017-03-31 NOTE — Telephone Encounter (Signed)
Received notice that patient's daughter called and requested Oxycodone refill for him as he was having chills still despite Tylenol.  See my note below.  As per clinical staff, advised patient that he was referred to ID already, Dr Ola Spurr office was called and they will schedule him in late February is soonest available, unfortunately cant be seen sooner.  He should have apt soon with [facility] as well for follow-up knee, which is what his previous [facility] was prescribing Oxycodone for but this was helping some of the chills -----------------------------------------------  I do not plan to rx Oxycodone or opiate pain medicine for him at this time to treat these chills. I do not have a clear cause, but the pain medicine would be for his knee, this needs to be discussed further with his Orthopedic surgeon (previous one, or new consult with [facility]).  He may continue Naproxen and Tylenol as needed, otherwise I do not have other meds for him, and he may check back in with [facility] or wait to see Dr Ola Spurr next as planned.  If his symptoms are dramatically worse or new concerns fevers or other related symptoms of infection including redness or swelling of Left knee, he should go to hospital Emergency Dept for further evaluation.  [name], [facility] 03/31/2017, 1:06 PM

## 2017-04-02 ENCOUNTER — Telehealth: Payer: Self-pay | Admitting: Family Medicine

## 2017-04-02 NOTE — Telephone Encounter (Signed)
I am not exactly sure how to help at this time. The surgery was performed at [facility], and now patient was requesting 2nd opinion at [facility], which referral was already sent.  It seems like Engineer, building services made their decision to not see patient and that they want him to keep his care with original orthopedist at [facility].  If you can contact patient's daughter, Dedra Skeens, to find out what they were looking for and how we can help.  I cannot make [facility] change their mind, but only other thing to offer would be referral to OTHER Orthopedic. I would ask that the family call a different office FIRST to find out if they would accept him as a patient, before we place any new referrals.  Possibly [facility] Address: [address], [place], [place] [address] Hours:  9AM-5PM Phone: [phone]  [name], [facility] 04/02/2017, 12:29 PM

## 2017-04-02 NOTE — Telephone Encounter (Signed)
Pt advised.

## 2017-04-02 NOTE — Telephone Encounter (Signed)
Pt. daughter requesting that you call her. She states that the referral that was done for Ortho at [facility] they was told that they need to go back  To  [facility]

## 2017-04-07 ENCOUNTER — Other Ambulatory Visit: Payer: Self-pay | Admitting: Family Medicine

## 2017-04-07 ENCOUNTER — Ambulatory Visit (INDEPENDENT_AMBULATORY_CARE_PROVIDER_SITE_OTHER): Payer: Medicare HMO

## 2017-04-07 ENCOUNTER — Other Ambulatory Visit: Payer: Self-pay

## 2017-04-07 VITALS — BP 130/78 | HR 66 | Temp 98.6°F | Resp 16 | Ht 66.0 in | Wt 156.8 lb

## 2017-04-07 DIAGNOSIS — Z23 Encounter for immunization: Secondary | ICD-10-CM

## 2017-04-07 DIAGNOSIS — Z0000 Encounter for general adult medical examination without abnormal findings: Secondary | ICD-10-CM | POA: Diagnosis not present

## 2017-04-07 DIAGNOSIS — K219 Gastro-esophageal reflux disease without esophagitis: Secondary | ICD-10-CM

## 2017-04-07 MED ORDER — RANITIDINE HCL 150 MG PO TABS: 150.0000 mg | ORAL_TABLET | Freq: Two times a day (BID) | ORAL | 0 refills | Status: DC

## 2017-04-07 NOTE — Patient Instructions (Addendum)
[name] , Thank you for taking time to come for your Medicare Wellness Visit. I appreciate your ongoing commitment to your health goals. Please review the following plan we discussed and let me know if I can assist you in the future.   Screening recommendations/referrals: Colonoscopy: completed 02/06/2017 Recommended yearly ophthalmology/optometry visit for glaucoma screening and checkup Recommended yearly dental visit for hygiene and checkup  Vaccinations: Influenza vaccine: up to date  Pneumococcal vaccine: prevnar 13 done today Tdap vaccine: up to date Shingles vaccine: up to date  Advanced directives: Advance directive discussed with you today. I have provided a copy for you to complete at home and have notarized. Once this is complete please bring a copy in to our office so we can scan it into your chart.  Conditions/risks identified: Recommend drinking at least 6-8 glasses of water a day   Next appointment: Follow up on 05/22/2017 at 11:00am with Ross Riley. Follow up in one year for your annual wellness exam.    Cuidados preventivos en los hombres a partir de los 52 aos de edad Preventive Care 65 Years and Older, Male Los cuidados preventivos hacen referencia a las opciones en cuanto al estilo de vida y a las visitas al mdico, las cuales pueden promover la salud y Musician. Qu incluyen los cuidados preventivos?  Un examen fsico anual. Esto tambin se conoce como control de bienestar anual.  Exmenes dentales [place] al ao.  Exmenes de la vista de rutina. Pregntele al mdico con qu frecuencia debe realizarse un control de la vista.  Opciones personales de estilo de vida, que incluyen lo siguiente: ? [employer] y las encas a diario. ? Realizar actividad fsica con regularidad. ? Tener una dieta saludable. ? Evitar el consumo de tabaco y drogas. ? Limitar el consumo de bebidas alcohlicas. ? Counsellor. ? Tomar una dosis  baja de [employer]. ? Tomar los suplementos de vitaminas o minerales como se lo haya indicado el mdico. Qu sucede durante un control de bienestar anual? Los servicios y exmenes de deteccin realizados por su mdico durante el control de bienestar anual dependern de su salud general, factores de riesgo de estilo de vida y los antecedentes familiares de enfermedades. Asesoramiento Su mdico puede preguntarle acerca de:  Consumo de alcohol.  Consumo de tabaco.  Consumo de drogas.  Bienestar emocional.  Bienestar en el hogar y las relaciones personales.  Actividad sexual.  Hbitos de alimentacin.  Antecedentes de cadas.  Memoria y capacidad de comprensin (facultades cognitivas).  Trabajo y [place] laboral.  Pruebas de deteccin Pueden hacerle las siguientes pruebas o mediciones:  IT consultant, peso e ndice de masa muscular [facility]).  Presin arterial.  Niveles de lpidos y colesterol. Estos se pueden verificar cada 5 aos o, con ms frecuencia, si usted tiene ms de 18 aos de edad.  Control de la piel.  Pruebas de deteccin de cncer de pulmn. Es posible que se le realice esta prueba de deteccin a partir de los 61 aos de edad, si ha fumado durante 30 aos un paquete diario y sigue fumando o dej el hbito en algn momento en los ltimos 15 aos.  Prueba de Personnel officer en las heces [facility]). Es posible que se le realice esta prueba todos los aos a partir de los 48 aos de [place].  Sigmoidoscopa o colonoscopa flexible. Es posible que se le realice una sigmoidoscopa cada 5 aos o una colonoscopa cada 10 aos a Proofreader de  los 50 aos de [place].  Examen de deteccin del cncer de prstata. Las recomendaciones variarn segn sus antecedentes familiares y Hydrologist.  Anlisis de sangre para la deteccin de la hepatitis C.  Anlisis de sangre para la deteccin de la hepatitis B.  Anlisis de enfermedades de transmisin sexual (ETS).  Pruebas de deteccin  de la diabetes. Esto se Set designer un control del azcar en la sangre (glucosa) despus de no haber comido durante un periodo de tiempo (ayuno). Es posible que se le realice esta prueba cada 1 a 3 tres aos.  Estudio de deteccin de aneurisma artico abdominal (AAA). Es posible que necesite este estudio si es fumador o lo fue en el pasado.  Osteoporosis. Se le puede realizar este estudio de deteccin a partir de los 70 aos de edad si tiene un Social research officer, government.  Hable con su mdico [employer], las opciones de tratamiento y, si corresponde, la necesidad de Optometrist ms pruebas. Vacunas El mdico puede recomendarle que se aplique algunas vacunas, por ejemplo:  Vacuna contra la gripe. Se recomienda aplicarse esta vacuna todos los aos.  Vacuna contra la difteria, ttanos y tos Dietitian (DTPa, DT). Es posible que tenga que aplicarse un refuerzo contra el ttanos y la difteria (DT) cada 10aos.  Vacuna contra la varicela. Es posible que tenga que aplicrsela si no recibi esta vacuna.  Vacuna contra el herpes zster. Es posible que la necesite despus de los 38 aos de edad.  Vacuna contra el sarampin, rubola y paperas (SRP). Es posible que necesite aplicarse al menos una dosis de la vacuna SRP si naci despus de [phone]. Podra tambin necesitar una segunda dosis.  Vacuna antineumoccica conjugada 13 valente (PCV13). Se recomienda una dosis despus de los 4 aos de [place].  Vacuna antineumoccica de polisacridos (PPSV23). Se recomienda una dosis despus de los 26 aos de [place].  Vacuna antimeningoccica. Puede necesitar esta vacuna si tiene determinadas afecciones.  Vacuna contra la hepatitis A. Es posible que necesite esta vacuna si tiene ciertas afecciones o si viaja o trabaja en lugares en los que podra estar expuesto a la hepatitis A.  Vacuna contra la hepatitis B. Es posible que necesite esta vacuna si tiene ciertas afecciones o si viaja o trabaja en  lugares en los que podra estar expuesto a la hepatitis B.  Vacuna contra antihaemophilus influenzae tipoB (Hib). Es posible que necesite esta vacuna si tiene determinados factores de [place].  Hable con el mdico sobre qu pruebas de deteccin y qu vacunas necesita, y con qu frecuencia las necesita. Esta informacin no tiene Marine scientist el consejo del mdico. Asegrese de hacerle al mdico cualquier pregunta que tenga. Document Released: 03/16/2015 Document Revised: 06/13/2016 Document Reviewed: 12/19/2014 Elsevier Interactive Patient Education  [employer]. .

## 2017-04-07 NOTE — Progress Notes (Signed)
Subjective:   Ross Riley is a 67 y.o. male who presents for an Initial Medicare Annual Wellness Visit.  Review of Systems   Cardiac Risk Factors include: hypertension;advanced age (>31men, >18 women);male gender;dyslipidemia;diabetes mellitus;smoking/ tobacco exposure    Objective:    Today's Vitals   04/07/17 1327  BP: 130/78  Pulse: 66  Resp: 16  Temp: 98.6 F (37 C)  TempSrc: Oral  Weight: 156 lb 12.8 oz (71.1 kg)  Height: 5\' 6"  (1.676 m)   Body mass index is 25.31 kg/m.  Advanced Directives 04/07/2017 05/20/2016 12/06/2014 11/27/2014  Does Patient Have a Medical Advance Directive? No No No No  Would patient like information on creating a medical advance directive? No - Patient declined No - Patient declined No - patient declined information Yes - Educational materials given    Current Medications (verified) Outpatient Encounter Medications as of 04/07/2017  Medication Sig  . aspirin EC 81 MG tablet Take by mouth.  Marland Kitchen atorvastatin (LIPITOR) 20 MG tablet Take 1 tablet (20 mg total) by mouth daily.  . benazepril-hydrochlorthiazide (LOTENSIN HCT) 10-12.5 MG tablet Take 1 tablet by mouth daily.  Marland Kitchen BESIVANCE 0.6 % SUSP Place 1 drop into the right eye 3 (three) times daily.  . bimatoprost (LUMIGAN) 0.01 % SOLN Place 1 drop into both eyes at bedtime.  . brimonidine (ALPHAGAN P) 0.1 % SOLN Apply to eye.  . brimonidine (ALPHAGAN) 0.2 % ophthalmic solution INT 1 GTT IN OU TID  . Calcium Citrate-Vitamin D 1000-400 LIQD   . dorzolamide (TRUSOPT) 2 % ophthalmic solution Apply to eye.  . DUREZOL 0.05 % EMUL Place 1 drop into the right eye 3 (three) times daily.  Marland Kitchen EFFERVESCENT PAIN RELIEF [id number] MG TBEF tablet   . fidaxomicin (DIFICID) 200 MG TABS tablet Take 200 mg by mouth 2 (two) times daily. For 10 days (prescribed by Dr Bayard Hugger [facility])  . fluticasone (FLONASE) 50 MCG/ACT nasal spray Place 2 sprays into both nostrils daily.  Marland Kitchen glipiZIDE (GLUCOTROL XL) 5 MG 24 hr tablet  Take 1 tablet (5 mg total) by mouth daily with breakfast.  . ketorolac (ACULAR) 0.4 % SOLN Apply to eye.  . loratadine (CLARITIN) 10 MG tablet Take 1 tablet (10 mg total) by mouth daily.  . metoprolol tartrate (LOPRESSOR) 25 MG tablet Take 0.5 tablets (12.5 mg total) by mouth 2 (two) times daily.  . naproxen (NAPROSYN) 500 MG tablet TAKE 1 TABLET(500 MG) BY MOUTH TWICE DAILY WITH A MEAL FOR 2 TO 4 WEEKS THEN AS NEEDED  . nitazoxanide (ALINIA) 500 MG tablet Take by mouth.  Marland Kitchen omeprazole (PRILOSEC) 20 MG capsule Take 1 capsule (20 mg total) by mouth daily.  . ondansetron (ZOFRAN ODT) 4 MG disintegrating tablet Take 1 tablet (4 mg total) every 8 (eight) hours as needed by mouth for nausea or vomiting.  . polyethylene glycol powder (GLYCOLAX/MIRALAX) powder MIX ONE CAPFUL IN LIQUID AND DRINK DAILY AS NEEDED.  Marland Kitchen PROLENSA 0.07 % SOLN Place 1 drop into the left eye at bedtime.  . sildenafil (REVATIO) 20 MG tablet Take 1-5 pills about 30 min prior to sex. Start with 1 and increase as needed.  . timolol (TIMOPTIC) 0.25 % ophthalmic solution INT 1 GTT IN OU QAM  . tiZANidine (ZANAFLEX) 2 MG tablet   . vancomycin (VANCOCIN) 125 MG capsule Take by mouth.    No facility-administered encounter medications on file as of 04/07/2017.     Allergies (verified) Metformin and related and Metformin hcl  History: Past Medical History:  Diagnosis Date  . Arthritis   . BPH (benign prostatic hyperplasia)   . Chronic kidney disease    kidney stones  . Diabetes mellitus without complication ([place])   . Dystonia   . ED (erectile dysfunction)   . GERD (gastroesophageal reflux disease)   . Glaucoma (increased eye pressure)   . Hematuria   . Hyperlipidemia   . Hypertension    Past Surgical History:  Procedure Laterality Date  . EXTRACORPOREAL SHOCK WAVE LITHOTRIPSY Right   . KIDNEY STONE SURGERY  2009  . PROSTATE SURGERY  2011  . TRANSURETHRAL RESECTION OF PROSTATE N/A 12/06/2014   Procedure: TRANSURETHRAL  RESECTION OF THE PROSTATE (TURP);  Surgeon: Nickie Retort, MD;  Location: [facility] ORS;  Service: Urology;  Laterality: N/A;   Family History  Family history unknown: Yes   Social History   Socioeconomic History  . Marital status: Married    Spouse name: None  . Number of children: None  . Years of education: None  . Highest education level: None  Social Needs  . Financial resource strain: Somewhat hard  . Food insecurity - worry: Sometimes true  . Food insecurity - inability: Sometimes true  . Transportation needs - medical: Yes  . Transportation needs - non-medical: Yes  Occupational History  . None  Tobacco Use  . Smoking status: Former Smoker    Packs/day: 1.00    Years: 10.00    Pack years: 10.00    Types: Cigarettes    Last attempt to quit: 03/04/1991    Years since quitting: 26.1  . Smokeless tobacco: Never Used  Substance and Sexual Activity  . Alcohol use: Yes    Alcohol/week: 0.0 oz    Comment: occasional  . Drug use: No  . Sexual activity: None  Other Topics Concern  . None  Social History Narrative  . None   Tobacco Counseling Counseling given: Not Answered   Clinical Intake:  Pre-visit preparation completed: Yes  Pain : No/denies pain     Nutritional Status: BMI 25 -29 Overweight Nutritional Risks: None Diabetes: Yes CBG done?: No Did pt. bring in CBG monitor from home?: No  How often do you need to have someone help you when you read instructions, pamphlets, or other written materials from your doctor or pharmacy?: 3 - Sometimes(if its in [place] ) What is the last grade level you completed in school?: 9th grade  Interpreter Needed?: Yes Interpreter Agency: [employer] Interpreter Name: [name] Patient Declined Interpreter : No Patient signed [place] waiver: Yes  Information entered by :: Tiffany Hill,LPN  Activities of Daily Living In your present state of health, do you have any difficulty performing the following activities:  04/07/2017 11/24/2016  Hearing? N N  Vision? Y N  Comment glaucoma  -  Difficulty concentrating or making decisions? Y N  Walking or climbing stairs? Y Y  Comment because of brace on left leg -  Dressing or bathing? N N  Doing errands, shopping? Y N  Comment sometimes daughter assits -  Conservation officer, nature and eating ? N -  Using the Toilet? N -  In the past six months, have you accidently leaked urine? Y -  Comment wears protection due to leakage -  Do you have problems with loss of bowel control? N -  Managing your Medications? N -  Managing your Finances? N -  Housekeeping or managing your Housekeeping? N -  Some recent data might be hidden  Immunizations and Health Maintenance Immunization History  Administered Date(s) Administered  . Influenza, High Dose Seasonal PF 11/24/2016  . Influenza,inj,Quad PF,6+ Mos 12/15/2012, 12/07/2014  . Influenza-Unspecified 12/07/2014, 02/13/2016  . Pneumococcal Conjugate-13 04/07/2017  . Pneumococcal Polysaccharide-23 04/25/2013  . Tdap 12/15/2012  . Zoster Recombinat (Shingrix) 06/22/2016   Health Maintenance Due  Topic Date Due  . OPHTHALMOLOGY EXAM  05/02/2015  . HEMOGLOBIN A1C  01/31/2017    Patient Care Team: Olin Hauser, DO as PCP - General (Family Medicine) Ardelle Lesches, MD as Referring Physician (Internal Medicine) Leanor Kail, MD (Orthopedic Surgery)  Indicate any recent Medical Services you may have received from other than [facility] providers in the past year (date may be approximate).    Assessment:   This is a routine wellness examination for [facility]  Hearing/Vision screen Vision Screening Comments: Goes to [facility]  Dietary issues and exercise activities discussed: Current Exercise Habits: The patient does not participate in regular exercise at present, Exercise limited by: orthopedic condition(s)  Goals    . DIET - INCREASE WATER INTAKE     Recommend drinking at least 6-8 glasses of  water a day       Depression Screen PHQ 2/9 Scores 04/07/2017 11/24/2016 06/05/2016 01/12/2015  PHQ - 2 Score 2 0 0 0  PHQ- 9 Score 4 - - -    Fall Risk Fall Risk  04/07/2017 11/24/2016 06/05/2016 01/12/2015 10/26/2014  Falls in the past year? No No No No No    Is the patient's home free of loose throw rugs in walkways, pet beds, electrical cords, etc?   yes      Grab bars in the bathroom? yes      Handrails on the stairs?   yes      Adequate lighting?   yes  Timed Get Up and Go performed: Completed in 10 seconds with no use of assistive devices, steady gait. No intervention needed at this time.   Cognitive Function:        Screening Tests Health Maintenance  Topic Date Due  . OPHTHALMOLOGY EXAM  05/02/2015  . HEMOGLOBIN A1C  01/31/2017  . FOOT EXAM  12/04/2017  . PNA vac Low Risk Adult (2 of 2 - PPSV23) 04/07/2018  . TETANUS/TDAP  12/02/2022  . COLONOSCOPY  02/07/2027  . INFLUENZA VACCINE  Completed  . Hepatitis C Screening  Completed    Qualifies for Shingles Vaccine?yes, already recieved  Cancer Screenings: Lung: Low Dose CT Chest recommended if Age 11-80 years, 30 pack-year currently smoking OR have quit w/in 15years. Patient does not qualify. Colorectal: completed 02/06/2017  Additional Screenings:  Hepatitis B/HIV/Syphillis:not indicated Hepatitis C Screening: completed 10/23/2014      Plan:    I have personally reviewed and addressed the Medicare Annual Wellness questionnaire and have noted the following in the patient's chart:  A. Medical and social history B. Use of alcohol, tobacco or illicit drugs  C. Current medications and supplements D. Functional ability and status E.  Nutritional status F.  Physical activity G. Advance directives H. List of other physicians I.  Hospitalizations, surgeries, and ER visits in previous 12 months J.  [place] such as hearing and vision if needed, cognitive and depression L. Referrals and appointments   In  addition, I have reviewed and discussed with patient certain preventive protocols, quality metrics, and best practice recommendations. A written personalized care plan for preventive services as well as general preventive health recommendations were provided to patient.  Signed,  Tyler Aas, LPN Nurse Health Advisor   Nurse Notes:   Having headaches and dizziness- started 3 days ago. Used to have headaches a long time ago and took medication, but is unsure of the name of the medication. He has been taking this medication for the last 3 days.  Patient declined visit today, he will call if the headaches or dizziness persist or  worsen before his next visit on 05/22/2017.    Patient states he needs a different referral for orthopedic. States Dorise Hiss requested he goes to the office he had his surgery done- [facility]  Faxed request to [facility] for diabetic eye exam results.

## 2017-04-12 ENCOUNTER — Other Ambulatory Visit: Payer: Self-pay | Admitting: Family Medicine

## 2017-04-12 DIAGNOSIS — E1136 Type 2 diabetes mellitus with diabetic cataract: Secondary | ICD-10-CM

## 2017-04-17 DIAGNOSIS — R200 Anesthesia of skin: Secondary | ICD-10-CM | POA: Diagnosis not present

## 2017-04-17 DIAGNOSIS — R202 Paresthesia of skin: Secondary | ICD-10-CM | POA: Diagnosis not present

## 2017-04-17 DIAGNOSIS — Z96652 Presence of left artificial knee joint: Secondary | ICD-10-CM | POA: Diagnosis not present

## 2017-04-20 ENCOUNTER — Telehealth: Payer: Self-pay | Admitting: Family Medicine

## 2017-04-20 DIAGNOSIS — R509 Fever, unspecified: Secondary | ICD-10-CM | POA: Diagnosis not present

## 2017-04-20 DIAGNOSIS — K5733 Diverticulitis of large intestine without perforation or abscess with bleeding: Secondary | ICD-10-CM | POA: Diagnosis not present

## 2017-04-20 DIAGNOSIS — A0472 Enterocolitis due to Clostridium difficile, not specified as recurrent: Secondary | ICD-10-CM | POA: Diagnosis not present

## 2017-04-20 DIAGNOSIS — Z96652 Presence of left artificial knee joint: Secondary | ICD-10-CM

## 2017-04-20 DIAGNOSIS — M1712 Unilateral primary osteoarthritis, left knee: Secondary | ICD-10-CM

## 2017-04-20 NOTE — Telephone Encounter (Signed)
Faxed

## 2017-04-20 NOTE — Telephone Encounter (Signed)
Ross Riley said [employer] needs an authorization for durable medical equipment for stationary commode chair from June 2018 after knee surgery.  Please fax to [facility] [phone].  Her call back number is [phone]

## 2017-04-20 NOTE — Telephone Encounter (Signed)
Printed DME for Bedside Commode equipement, dx Left Knee s/p Total Knee Replacement TKR E07.121, and Left knee osteoarthritis degenerative M17.12, DME order printed, (similar order that was written back in 09/22/16).  It will be faxed to [facility], # below.  [name], [facility] 04/20/2017, 3:43 PM

## 2017-05-07 ENCOUNTER — Telehealth: Payer: Self-pay | Admitting: Family Medicine

## 2017-05-07 DIAGNOSIS — Z96652 Presence of left artificial knee joint: Secondary | ICD-10-CM

## 2017-05-07 DIAGNOSIS — M1712 Unilateral primary osteoarthritis, left knee: Secondary | ICD-10-CM

## 2017-05-07 NOTE — Telephone Encounter (Signed)
Order has been faxed and scanned.

## 2017-05-07 NOTE — Telephone Encounter (Signed)
Patient's daughter notified office that they did not receive the DME equipment that we sent, this has been attempted twice now, initially in 08/2016, and again 04/20/17.  We will try once more to submit this rx for DME for Bedside Commode equipement, dx Left Knee s/p Total Knee Replacement TKR K34.917, and Left knee osteoarthritis degenerative M17.12, DME order printed.  [name], [facility] 05/07/2017, 9:35 AM

## 2017-05-11 DIAGNOSIS — K5733 Diverticulitis of large intestine without perforation or abscess with bleeding: Secondary | ICD-10-CM | POA: Diagnosis not present

## 2017-05-11 DIAGNOSIS — R350 Frequency of micturition: Secondary | ICD-10-CM | POA: Diagnosis not present

## 2017-05-11 DIAGNOSIS — A0472 Enterocolitis due to Clostridium difficile, not specified as recurrent: Secondary | ICD-10-CM | POA: Diagnosis not present

## 2017-05-11 DIAGNOSIS — R6883 Chills (without fever): Secondary | ICD-10-CM | POA: Diagnosis not present

## 2017-05-11 DIAGNOSIS — R634 Abnormal weight loss: Secondary | ICD-10-CM | POA: Diagnosis not present

## 2017-05-22 ENCOUNTER — Ambulatory Visit (INDEPENDENT_AMBULATORY_CARE_PROVIDER_SITE_OTHER): Payer: Medicare HMO | Admitting: Family Medicine

## 2017-05-22 ENCOUNTER — Encounter: Payer: Self-pay | Admitting: Family Medicine

## 2017-05-22 ENCOUNTER — Other Ambulatory Visit: Payer: Self-pay | Admitting: Family Medicine

## 2017-05-22 VITALS — BP 100/66 | HR 50 | Temp 98.5°F | Resp 16 | Ht 66.0 in | Wt 163.8 lb

## 2017-05-22 DIAGNOSIS — K219 Gastro-esophageal reflux disease without esophagitis: Secondary | ICD-10-CM

## 2017-05-22 DIAGNOSIS — E1136 Type 2 diabetes mellitus with diabetic cataract: Secondary | ICD-10-CM

## 2017-05-22 DIAGNOSIS — R6883 Chills (without fever): Secondary | ICD-10-CM

## 2017-05-22 DIAGNOSIS — G248 Other dystonia: Secondary | ICD-10-CM

## 2017-05-22 DIAGNOSIS — E119 Type 2 diabetes mellitus without complications: Secondary | ICD-10-CM | POA: Diagnosis not present

## 2017-05-22 DIAGNOSIS — E039 Hypothyroidism, unspecified: Secondary | ICD-10-CM | POA: Diagnosis not present

## 2017-05-22 DIAGNOSIS — Z96652 Presence of left artificial knee joint: Secondary | ICD-10-CM | POA: Diagnosis not present

## 2017-05-22 LAB — POCT GLYCOSYLATED HEMOGLOBIN (HGB A1C): HEMOGLOBIN A1C: 6.2 — AB (ref ?–5.7)

## 2017-05-22 MED ORDER — OMEPRAZOLE 40 MG PO CPDR: 40.0000 mg | DELAYED_RELEASE_CAPSULE | Freq: Every day | ORAL | 2 refills | Status: DC

## 2017-05-22 MED ORDER — LEVOTHYROXINE SODIUM 25 MCG PO TABS: 25.0000 ug | ORAL_TABLET | Freq: Every day | ORAL | 2 refills | Status: DC

## 2017-05-22 NOTE — Assessment & Plan Note (Signed)
Stable, chronic Left foot deformity - chronic problem >14 years now. It is significantly affecting his gait and limiting his ambulation which appears to have affected degenerative wear on his Left knee, now s/p TKR L knee - Followed by Neurology / Manson Passey - he has diabetic shoes and orthotic inserts / AFO - ordered 12/2016  Plan: Written rx for Lateral Shoe Wedge (Left foot) - faxed to [facility] in [place] by his request, they are expecting rx

## 2017-05-22 NOTE — Patient Instructions (Addendum)
Thank you for coming to the office today.   Your symptoms sound most consistent with Silent Reflux or (Laryngopharyngeal Reflux), this is similar to Acid Reflux (or GERD) but usually involves the Throat and has a variety of symptoms including a "globus sensation", or air bubble or pressure in throat. Commonly occurs in patients who have had some symptoms of traditional heartburn before, but this can occur even when not eating spicy foods. - Start rx Omeprazole 40mg  - Take one capsule 30 min before first meal of day, same time every day for at least 2 weeks, max treatment up to 4 weeks then stop. If it resolves but then comes back again, you can repeat the 2-4 week course again as needed. - Avoid spicy, greasy, fried foods, also things like caffeine, dark chocolate, peppermint can worsen - Avoid large meals and late night snacks, also do not go more than 4-5 hours without a snack or meal (not eating will worsen reflux symptoms due to stomach acid)  Keep using Flonase nose spray 2 sprays in each nostril every day for at least 1 month  If the problem improves but keeps coming back, we can discuss higher dose or longer course at next visit.  If symptoms are worsening, persistent symptoms despite treatment or develop esophageal or abdominal pain, unable to swallow solids or liquids, nausea, vomiting, fever/chills, or unintentional weight loss / no appetite, please follow-up sooner or seek more immediate medical attention.  ------------- You had recent abnormal lab test from Dr Ola Spurr showed elevated TSH - 5.977 this means low thyroid hormone in body, it can normally control temperature and energy, so it may be cause of your chills  Start new Levothyroxine 11mcg daily in morning on empty stomach before breakfast take every day for next 8 weeks or more  We will re-check blood in 8 weeks  We will fax order for Lateral Heel Wedge to [facility] requesting it - let us know if does not  work  Referral was re-faxed to Nutritional therapist (formerly [place]) Address: [address], [place], [place] [address] Hours:  9AM-5PM Phone: [phone]   DUE for [place] (no food or drink after midnight before the lab appointment, only water or coffee without cream/sugar on the morning of)  SCHEDULE "Lab Only" visit in the morning at the clinic for lab draw in [place]   - Make sure Lab Only appointment is at about 1 week before your next appointment, so that results will be available  For Lab Results, once available within 2-3 days of blood draw, you can can log in to MyChart online to view your results and a brief explanation. Also, we can discuss results at next follow-up visit.    Please schedule a Follow-up Appointment to: Return in about 8 weeks (around 07/17/2017) for LPR GERD / Thyroid follow-up.  If you have any other questions or concerns, please feel free to call the office or send a message through [place]. You may also schedule an earlier appointment if necessary.  Additionally, you may be receiving a survey about your experience at our office within a few days to 1 week by e-mail or mail. We value your feedback.  [name], DO [facility], [facility]   Hipotiroidismo Hypothyroidism El hipotiroidismo es un trastorno de la tiroides. una glndula grande ubicada en la parte anterior e inferior del cuello. La tiroides Administrator hormonas que controlan el funcionamiento del [place]. En los [employer]  de hipotiroidismo, la glndula no produce la cantidad suficiente de estas hormonas. Cules son las causas? Las causas del hipotiroidismo pueden incluir lo siguiente:  Infecciones virales.  Embarazo.  Un ataque del sistema de defensa (sistema inmunitario) a la tiroides.  Ciertos medicamentos.  Defectos congnitos.  Radioterapias anteriores en la cabeza o el cuello.  Tratamiento previo con yodo  radioactivo.  Extirpacin quirrgica previa de una parte o de toda la tiroides.  Problemas con la glndula ubicada en el centro del cerebro (hipfisis).  Cules son los signos o los sntomas? Los signos y los sntomas de hipotiroidismo pueden ser los siguientes:  Sensacin de falta de Teacher, early years/pre ([place]).  Incapacidad para tolerar el fro.  Aumento de peso que no puede explicarse por un cambio en la dieta o en los hbitos de ejercicio fsico.  Piel seca.  Pelo grueso.  Irregularidades menstruales.  Ralentizacin de los procesos de pensamiento.  Estreimiento.  Tristeza o depresin.  Cmo se diagnostica? El mdico puede diagnosticar el hipotiroidismo con anlisis de sangre y Engineer, materials. Cmo se trata? El hipotiroidismo se trata con medicamentos que reemplazan las hormonas que el cuerpo no produce. Despus de Biochemist, clinical, pueden pasar varias semanas hasta la desaparicin de los sntomas. Siga estas instrucciones en su casa:  Tome los medicamentos solamente como se lo haya indicado el mdico.  Si empieza a tomar medicamentos nuevos, infrmele al mdico.  Consulting civil engineer a todas las visitas de control como se lo haya indicado el mdico. Esto es importante. A medida que la enfermedad mejora, es posible que haya que modificar las dosis. Tendr que hacerse anlisis de sangre peridicamente, de modo que el mdico pueda controlar la enfermedad. Comunquese con un mdico si:  Los sntomas no mejoran con Dispensing optician.  Est tomando medicamentos sustitutivos de la tiroides y: ? Copywriter, advertising. ? Siente temblores. ? Est ansioso. ? [place] de peso rpidamente. ? No puede Agricultural engineer. ? Tiene cambios emocionales. ? Tiene diarrea. ? Se siente dbil. Solicite ayuda de inmediato si:  Electronics engineer.  Tiene latidos cardacos irregulares o siente dolor en el pecho.  Nota que la frecuencia cardaca est acelerada. Esta informacin no tiene Marine scientist  el consejo del mdico. Asegrese de hacerle al mdico cualquier pregunta que tenga. Document Released: 02/17/2005 Document Revised: 05/26/2016 Document Reviewed: 07/05/2013 Elsevier Interactive Patient Education  2018 [employer].

## 2017-05-22 NOTE — Assessment & Plan Note (Signed)
Persistent stiffness with limited range and some pain s/p TKR per [facility] Followed with outpatient PT  Plan: 1. Referral to [facility] for 2nd opinion per patient request  - this was sent 03/2017, we have contacted them today and patient should be called with apt soon

## 2017-05-22 NOTE — Assessment & Plan Note (Signed)
Uncertain etiology still, Infectious disease without clear diagnosis either Recent tested TSH that was mildly elevated, consider hypothyroidism as possible source Resolved C Diff to remain off antibiotics, possible antibiotic induced chills

## 2017-05-22 NOTE — Assessment & Plan Note (Signed)
Well-controlled DM with A1c 6.2, still controlled No known complications or hypoglycemia.  Plan:  1. Continue current therapy on Glipizide XL diet control / lifestyle therapy. Continue to improve as planned 2. Monitor CBG 3. Continue ASA, ACEi, Statin 4. Advised to schedule DM ophtho exam, send record 5. Follow-up within 6 months DM A1c

## 2017-05-22 NOTE — Progress Notes (Signed)
Subjective:    Patient ID: Ross Riley, male    DOB: 26-Oct-1950, 67 y.o.   MRN: [id number]  Ross Riley is a 67 y.o. male presenting on 05/22/2017 for Diabetes  History provided by patient as well as daughter present during exam  HPI   Diabetes Chronic history of DM2, has been well controlled on current regimen. He does not have confirmed diabetic neuropathy. See prior notes for background. - Specifically regarding Left lower extremity dystonia and instability. He has known history of osteoarthritis from prior imaging in 2016. Previously followed by Orthopedics in [place]. He is currently s/p L TKR and wears AFO for chronic L ankle dystonia Meds: Glipizide XL 5mg  daily Reports good compliance. Tolerating well w/o side-effects Currently on ACEi, ASA, Statin Lifestyle: - Diet (recent limited diet due to prior c diff and GI intolerances)  - Exercise (limited activity due to chronic LLE issues see note) Denies hypoglycemia, polyuria, visual changes, numbness or tingling.  Chronic Left Lower Ext L ankle Dystonia - Reports chronic history with intermittent aching pain, some instability, has not had significant fall, uses cane for assistance with ambulation, has gait difficulty due to chronic LLE dystonia in left ankle / foot (chart review shows this focal dystonia since 1994, denies this is related to CVA, has tried botox injections and other, previously seen by [facility] in 2014 - he has had Diabetic Shoes and Inserts ordered last 12/2016 - He wears AFO - Wears compression knee sleeve with improvement in stability - Now requesting lateral shoe wedge ordered from orthotic store locally - they need order faxed - Denies fall, trauma, redness, other joint problem  Elevated TSH / Chills Recently referred to [facility] ID Dr Ola Spurr in 05/2017 due to persistent episodic chills that have been present with C Diff, he has been on vancomycin prolonged course due to this, but states chills  before. He was advised to stay off all antibiotics due to C Diff for while now, and also had UA tested negative and TSH tested that was mild elevated 5.977. No prior dx of hypothyroidism, asked to return here - He is unaware of thyroid problems, never had this before  GERD LPR Additonal complaint today with persistent issue with phlegm in back of throat and mucus for days to weeks, per ID he was recently started on Flonase, without improvement so far in 10 days. - He has history of GERD< used to take Zantac or Omeprazole 20mg  but these are no longer active, he has no meds for it - Symtom worse at night - Denies significant heartburn and abdominal pain  FOLLOW-UP Left Knee Pain s/p surgery Followed by [facility], Dr Kerby Nora, s/p Left TKR surgery approximately 08/2016, last visit was 12/18/16 about 6 weeks after repeat procedure L knee manipulation under anesthesia, he has continued stiffness and discomfort, has DM neuropathy and chronic L ankle dystonia with AFO use with brace on L lower extremity foot. - Recently he has requested 2nd opinion from other orthopedic, was referred to [facility] in 03/2017, however they were unable to see him due to him already seeing [facility] - We have since contacted [facility] and they have agreed to see him but they have not heard back yet with apt - Denies redness, drainage, injury fall trauma    Depression screen [facility] 2/9 04/07/2017 11/24/2016 06/05/2016  Decreased Interest 1 0 0  Down, Depressed, Hopeless 1 0 0  PHQ - 2 Score 2 0 0  Altered  sleeping 0 - -  Tired, decreased energy 1 - -  Change in appetite 0 - -  Feeling bad or failure about yourself  1 - -  Trouble concentrating 0 - -  Moving slowly or fidgety/restless 0 - -  Suicidal thoughts 0 - -  PHQ-9 Score 4 - -  Difficult doing work/chores Not difficult at all - -    Social History   Tobacco Use  . Smoking status: Former Smoker    Packs/day: 1.00    Years: 10.00    Pack  years: 10.00    Types: Cigarettes    Last attempt to quit: 03/04/1991    Years since quitting: 26.2  . Smokeless tobacco: Never Used  Substance Use Topics  . Alcohol use: Yes    Alcohol/week: 0.0 oz    Comment: occasional  . Drug use: No    Review of Systems Per HPI unless specifically indicated above     Objective:    BP 100/66   Pulse (!) 50   Temp 98.5 F (36.9 C) (Oral)   Resp 16   Ht 5\' 6"  (1.676 m)   Wt 163 lb 12.8 oz (74.3 kg)   BMI 26.44 kg/m   Wt Readings from Last 3 Encounters:  05/22/17 163 lb 12.8 oz (74.3 kg)  04/07/17 156 lb 12.8 oz (71.1 kg)  03/25/17 157 lb (71.2 kg)    Physical Exam  Constitutional: He is oriented to person, place, and time. He appears well-developed and well-nourished. No distress.  Well-appearing, comfortable, cooperative  HENT:  Head: Normocephalic and atraumatic.  Mouth/Throat: Oropharynx is clear and moist.  Frontal / maxillary sinuses non-tender. Nares patent without purulence or edema. Bilateral TMs clear without erythema, effusion or bulging.  Oropharynx clear with very minimal posterior pharyngeal irritation without obvious erythema, exudates, edema or asymmetry.  Frequent throat and sinus clearing  Eyes: Conjunctivae are normal. Right eye exhibits no discharge. Left eye exhibits no discharge.  Neck: Normal range of motion. Neck supple. No thyromegaly present.  Non tender  Cardiovascular: Normal rate, regular rhythm, normal heart sounds and intact distal pulses.  No murmur heard. Pulmonary/Chest: Effort normal and breath sounds normal. No respiratory distress. He has no wheezes. He has no rales.  Abdominal: Soft. Bowel sounds are normal. He exhibits no distension. There is no tenderness.  Musculoskeletal: Normal range of motion. He exhibits no edema.  S/p L TKR with well healed incision, some localized L knee edema  Lymphadenopathy:    He has no cervical adenopathy.  Neurological: He is alert and oriented to person, place,  and time. No cranial nerve deficit. He exhibits abnormal muscle tone (chronic L ankle dystonia unchanged). Coordination normal.  Skin: Skin is warm and dry. No rash noted. He is not diaphoretic. No erythema.  Psychiatric: He has a normal mood and affect. His behavior is normal.  Well groomed, good eye contact, normal speech and thoughts  Nursing note and vitals reviewed.  Results for orders placed or performed in visit on 05/22/17  POCT HgB A1C  Result Value Ref Range   Hemoglobin A1C 6.2 (A) 5.7      Assessment & Plan:   Problem List Items Addressed This Visit    Chills (without fever)    Uncertain etiology still, Infectious disease without clear diagnosis either Recent tested TSH that was mildly elevated, consider hypothyroidism as possible source Resolved C Diff to remain off antibiotics, possible antibiotic induced chills      Focal dystonia  Stable, chronic Left foot deformity - chronic problem >14 years now. It is significantly affecting his gait and limiting his ambulation which appears to have affected degenerative wear on his Left knee, now s/p TKR L knee - Followed by Neurology / [facility] - he has diabetic shoes and orthotic inserts / AFO - ordered 12/2016  Plan: Written rx for Lateral Shoe Wedge (Left foot) - faxed to [facility] in [place] by his request, they are expecting rx      S/P TKR (total knee replacement), left    Persistent stiffness with limited range and some pain s/p TKR per [facility] Followed with outpatient PT  Plan: 1. Referral to [facility] for 2nd opinion per patient request  - this was sent 03/2017, we have contacted them today and patient should be called with apt soon      Type 2 diabetes mellitus with diabetic cataract, without long-term current use of insulin ([place]) - Primary    Well-controlled DM with A1c 6.2, still controlled No known complications or hypoglycemia.  Plan:  1. Continue current therapy on  Glipizide XL diet control / lifestyle therapy. Continue to improve as planned 2. Monitor CBG 3. Continue ASA, ACEi, Statin 4. Advised to schedule DM ophtho exam, send record 5. Follow-up within 6 months DM A1c       Other Visit Diagnoses    Laryngopharyngeal reflux (LPR)   Suspected silent reflux with laryngopharyngeal reflux symptoms now with globus sensation and sensation of air in throat. No significant oropharyngeal risk factors (non smoker, no alcohol). - No GI red flag symptoms. Exam is unremarkable with benign abdomen without pain today - Chronic history of GERD, only intermittent treatment in past H2 and PPI, now off therapy - May be complicated by sinus drainage as well, on Flonase now    Plan: 1. Start rx Omeprazole 40mg  daily 30 min prior to 1st meal for >4-8 weeks, may need repeat course in future if recurrence 2. Diet modifications reduce GERD - Elevated head of bed - Continue Flonase for at least 4 weeks 3. Follow-up 8 weeks    Acquired hypothyroidism     May be cause of chills TSH 5.977 per [facility] low dose levothyroxine 4mcg daily - empty stomach daily Re-check TSH, Free T4 in 8 weeks re-discuss    Relevant Medications   levothyroxine (SYNTHROID, LEVOTHROID) 25 MCG tablet   Other Relevant Orders   TSH   T4, free      Meds ordered this encounter  Medications  . DISCONTD: omeprazole (PRILOSEC) 40 MG capsule    Sig: Take 1 capsule (40 mg total) by mouth daily before breakfast. 15 to 30 min before 1st meal of day    Dispense:  30 capsule    Refill:  2  . levothyroxine (SYNTHROID, LEVOTHROID) 25 MCG tablet    Sig: Take 1 tablet (25 mcg total) by mouth daily before breakfast.    Dispense:  30 tablet    Refill:  2     Follow up plan: Return in about 8 weeks (around 07/17/2017) for LPR GERD / Thyroid follow-up.  Future labs ordered for 07/17/17  [name], [facility] 05/22/2017,  1:27 PM

## 2017-05-25 ENCOUNTER — Telehealth: Payer: Self-pay | Admitting: Family Medicine

## 2017-05-25 NOTE — Telephone Encounter (Signed)
Called Pt to discuss Community Resource Referral. c/b # [phone] Kathryn Brown ° °

## 2017-05-26 NOTE — Telephone Encounter (Signed)
Spoke with patients daughter and will email pertinent information in regards to applying for medicaid foodstamps as well as setting up transportation.

## 2017-06-09 DIAGNOSIS — Z8619 Personal history of other infectious and parasitic diseases: Secondary | ICD-10-CM | POA: Diagnosis not present

## 2017-06-09 DIAGNOSIS — Z8719 Personal history of other diseases of the digestive system: Secondary | ICD-10-CM | POA: Diagnosis not present

## 2017-06-09 DIAGNOSIS — Z86010 Personal history of colonic polyps: Secondary | ICD-10-CM | POA: Insufficient documentation

## 2017-06-15 DIAGNOSIS — E1136 Type 2 diabetes mellitus with diabetic cataract: Secondary | ICD-10-CM | POA: Diagnosis not present

## 2017-06-15 DIAGNOSIS — Z96652 Presence of left artificial knee joint: Secondary | ICD-10-CM | POA: Diagnosis not present

## 2017-06-15 DIAGNOSIS — G248 Other dystonia: Secondary | ICD-10-CM | POA: Diagnosis not present

## 2017-07-08 ENCOUNTER — Ambulatory Visit (INDEPENDENT_AMBULATORY_CARE_PROVIDER_SITE_OTHER): Payer: Medicare HMO | Admitting: Family Medicine

## 2017-07-08 ENCOUNTER — Other Ambulatory Visit: Payer: Self-pay | Admitting: Family Medicine

## 2017-07-08 ENCOUNTER — Ambulatory Visit
Admission: RE | Admit: 2017-07-08 | Discharge: 2017-07-08 | Disposition: A | Discharge status: Home / Self Care | Payer: Medicare HMO | Source: Ambulatory Visit | Attending: Family Medicine | Admitting: Family Medicine

## 2017-07-08 ENCOUNTER — Encounter: Payer: Self-pay | Admitting: Family Medicine

## 2017-07-08 VITALS — BP 111/71 | HR 64 | Temp 98.8°F | Resp 16 | Ht 66.0 in | Wt 170.0 lb

## 2017-07-08 DIAGNOSIS — Z87891 Personal history of nicotine dependence: Secondary | ICD-10-CM | POA: Diagnosis not present

## 2017-07-08 DIAGNOSIS — R0989 Other specified symptoms and signs involving the circulatory and respiratory systems: Secondary | ICD-10-CM

## 2017-07-08 DIAGNOSIS — H9202 Otalgia, left ear: Secondary | ICD-10-CM | POA: Diagnosis not present

## 2017-07-08 DIAGNOSIS — R05 Cough: Secondary | ICD-10-CM

## 2017-07-08 DIAGNOSIS — R0600 Dyspnea, unspecified: Secondary | ICD-10-CM

## 2017-07-08 DIAGNOSIS — R059 Cough, unspecified: Secondary | ICD-10-CM

## 2017-07-08 DIAGNOSIS — H6983 Other specified disorders of Eustachian tube, bilateral: Secondary | ICD-10-CM

## 2017-07-08 DIAGNOSIS — E039 Hypothyroidism, unspecified: Secondary | ICD-10-CM | POA: Insufficient documentation

## 2017-07-08 MED ORDER — BENZONATATE 100 MG PO CAPS: 100.0000 mg | ORAL_CAPSULE | Freq: Three times a day (TID) | ORAL | 0 refills | Status: DC | PRN

## 2017-07-08 MED ORDER — PREDNISONE 10 MG PO TABS: ORAL_TABLET | ORAL | 0 refills | Status: DC

## 2017-07-08 NOTE — Patient Instructions (Addendum)
Thank you for coming to the office today.  For breathing - we will check Chest X-ray  X-ray is NORMAL - there is no evidence of problem with lungs, no sign of pneumonia or fluid in lungs. There is no evidence of COPD or smoking damage to lung either. This is good news, but does not explain your symptom  In future if still shortness of breath episodes - we can consider an ECHO cardiogram - heart test to check function pumping of heart  You have some Eustachian Tube Dysfunction, this problem is usually caused by some deeper sinus swelling and pressure, causing difficulty of eustachian tubes to clear fluid from behind ear drum. You can have ear pain, pressure, fullness, loss of hearing. Often related to sinus symptoms and sometimes with sinusitis or infection or allergy symptoms.  Treatment: - Unfortunately the Flonase nasal steroid is not helping this is one of main treatments - I would continue this and also continue allergy pill daily - For significant ear/sinus pressure, try OTC decongestant (behind the counter - Pseudophed) this is stronger decongestant and can help reduce your pain and pressure  Start Prednisone pill to reduce swelling and pain of ear and also help cough / breathing - Take 6 pills day one, then next day down to 5, then next down to 4 , then to 3, then to 2, then 1 pill on last day  - AVOID Afrin nasal spray, if you use this do not use more than 3 days or can make sinus swelling worse  If not improving or worsening breathing call or return or go to hospital ED - we may consider antibiotic but we need to be careful to avoid bacterial infection again in colon  May need ENT specialist in future   Please schedule a Follow-up Appointment to: Return in about 1 week (around 07/15/2017), or if symptoms worsen or fail to improve, for eustachian tube ear pain, dyspnea.  If you have any other questions or concerns, please feel free to call the office or send a message through  [place]. You may also schedule an earlier appointment if necessary.  Additionally, you may be receiving a survey about your experience at our office within a few days to 1 week by e-mail or mail. We value your feedback.  [name], DO [place]

## 2017-07-08 NOTE — Progress Notes (Signed)
Subjective:    Patient ID: Ross Riley, male    DOB: 06-28-1950, 67 y.o.   MRN: [id number]  Ross Riley is a 67 y.o. male presenting on 07/08/2017 for Ear Pain (as per patient pain radiate from left ear to left side of throat onset yesterday)  Patient presents for a same day appointment.  HPI   Bilateral Eustachian Tube Dysfunction, L>R Ear Pain / Throat Pain Reports new problem with acute onset yesterday L > R Ear with fullness and pressure, and pain in L ear radiating down into neck. Not tried any new medicines for this. He takes Flonase regularly has had issues with sinuses in past without relief. History of allergies - No known sick contact - History of prior C Diff infection recurrent, and was advised to stay off antibiotics and be cautious with use - Admits associated dry cough for few days, non productive - Denies headache, hearing loss, fever chills, nausea vomiting abdominal pain, diarrhea  Dyspnea, episodic Additional complaint today few weeks of episodic dyspnea with difficulty catching breath at times, does not seem related to exertion always, not triggered by laying down flat or other position change, can have episodes of short of breath. He does not have known cardiac history or known pulmonary problem. He does not have dx COPD or CHF. He does not have prior ECHO on file. No recent chest or lung imaging. He is a former smoker >30 years 1ppd, has been quit since 1990s - Denies any chest pain or pressure, edema, productive cough, wheezing  Additionally history of elevated TSH Recently 05/2017 started on low dose Levothyroxine 40mcg daily. He is asking about this has blood test in 1 week for re-check lab on this.  Depression screen [facility] 2/9 04/07/2017 11/24/2016 06/05/2016  Decreased Interest 1 0 0  Down, Depressed, Hopeless 1 0 0  PHQ - 2 Score 2 0 0  Altered sleeping 0 - -  Tired, decreased energy 1 - -  Change in appetite 0 - -  Feeling bad or failure about yourself  1  - -  Trouble concentrating 0 - -  Moving slowly or fidgety/restless 0 - -  Suicidal thoughts 0 - -  PHQ-9 Score 4 - -  Difficult doing work/chores Not difficult at all - -    Social History   Tobacco Use  . Smoking status: Former Smoker    Packs/day: 1.00    Years: 32.00    Pack years: 32.00    Types: Cigarettes    Last attempt to quit: 03/04/1991    Years since quitting: 26.3  . Smokeless tobacco: Never Used  Substance Use Topics  . Alcohol use: Yes    Alcohol/week: 0.0 oz    Comment: occasional  . Drug use: No    Review of Systems Per HPI unless specifically indicated above     Objective:    BP 111/71   Pulse 64   Temp 98.8 F (37.1 C) (Oral)   Resp 16   Ht 5\' 6"  (1.676 m)   Wt 170 lb (77.1 kg)   SpO2 98%   BMI 27.44 kg/m   Wt Readings from Last 3 Encounters:  07/08/17 170 lb (77.1 kg)  05/22/17 163 lb 12.8 oz (74.3 kg)  04/07/17 156 lb 12.8 oz (71.1 kg)    Physical Exam  Constitutional: He is oriented to person, place, and time. He appears well-developed and well-nourished. No distress.  Well-appearing, comfortable, cooperative  HENT:  Head: Normocephalic and atraumatic.  Mouth/Throat: Oropharynx is clear and moist.  Frontal / maxillary sinuses non-tender. Nares patent without purulence or edema. Bilateral TMs with notable opaque effusion fullness and possible bulging L>R TM without erythema. Oropharynx clear without erythema, exudates, edema or asymmetry.  Eyes: Conjunctivae are normal. Right eye exhibits no discharge. Left eye exhibits no discharge.  Neck: Normal range of motion. Neck supple.  Mild tender across L lateral eustachian region into neck  Cardiovascular: Normal rate, regular rhythm, normal heart sounds and intact distal pulses.  No murmur heard. Pulmonary/Chest: Effort normal. No respiratory distress. He has no wheezes.  Abnormal lung sound bibasilar R>L some possible rhonchi vs crackles, difficult to discern sounds coarse, occasional cough,  somewhat clear. No focal wheezing or distinct focal abnormality. He speaks appropriately conversationally, no increased work of breathing at rest.  Musculoskeletal: Normal range of motion. He exhibits no edema (No evidence of edema).  Does not have cane or walker today, he has shoe with platform and able to ambulate without other assistance  Lymphadenopathy:    He has no cervical adenopathy.  Neurological: He is alert and oriented to person, place, and time.  Skin: Skin is warm and dry. No rash noted. He is not diaphoretic. No erythema.  Psychiatric: His behavior is normal.  Well groomed, good eye contact, normal speech and thoughts  Nursing note and vitals reviewed.  I have personally reviewed the radiology report from [facility] on 07/08/17.  CLINICAL DATA:  67 y/o M; nonproductive cough and abnormal lung sounds.  EXAM: CHEST - 2 VIEW  COMPARISON:  None.  FINDINGS: The heart size and mediastinal contours are within normal limits. Both lungs are clear. Mild degenerative changes of the thoracic spine.  IMPRESSION: No acute pulmonary process identified.   Electronically Signed   By: Kristine Garbe M.D.   On: 07/08/2017 14:40  Results for orders placed or performed in visit on 05/22/17  POCT HgB A1C  Result Value Ref Range   Hemoglobin A1C 6.2 (A) 5.7      Assessment & Plan:   Problem List Items Addressed This Visit    Former smoker   Hypothyroidism    Other Visit Diagnoses    Eustachian tube dysfunction, bilateral    -  Primary   Relevant Medications   predniSONE (DELTASONE) 10 MG tablet   Abnormal lung sounds       Relevant Orders   DG Chest 2 View (Completed)   Bibasilar crackles       Relevant Orders   DG Chest 2 View (Completed)   Dyspnea, unspecified type       Relevant Medications   predniSONE (DELTASONE) 10 MG tablet   Other Relevant Orders   DG Chest 2 View (Completed)   Left ear pain       Relevant Medications   predniSONE (DELTASONE)  10 MG tablet   Cough       Relevant Medications   benzonatate (TESSALON) 100 MG capsule      Acute on chronic L>R bilateral eustachian tube dysfunction with secondary b/l ear effusion, without loss of hearing or evidence of AOM or sinusitis. Likely related allergic rhinosinusitis based on exam and history. -Failed - flonase, allergy medicine  Plan: - Limited options - agree for more aggressive option given dyspnea as well will try Prednisone taper 60 to 10mg  over 6 days, rx written today for reducing eustachian tube swelling, and improve breathing - Continue Flonase 2 sprays each nare daily for up to 4-6 weeks or longer -  Continue OTC oral anti-histamine daily - Recommend may consider trial OTC oral decongestant (Pseudophed behind counter) for up to 1 week or less - AVOID Afrin nasal decongestant, counseled if uses, max dose up to 3 days or less, avoid rebound rhinitis Follow-up if not improved or worsening, return criteria - may need ENT in future, I have limited other options, try to avoid antibiotics due to history of C Diff  ------------------------------------------ Dyspnea - uncertain etiology, clinically well appearing, no evidence of cardiac etiology, hemodynamically stable and normal pulse Ox sat 98%. Lung exam does show some mild reduced breath sounds and abnormal sounds of bibasilar. Concern former smoker 1ppd for 30 year  Plan STAT Chest X-ray today - reviewed results above with patient in office, no sign of pneumonia or fluid or other lung problem Reassurance overall, does not seem to be due to heart , cannot rule out other primary lung etiology, does not seem infectious etiology Hold antibiotics - especially d/t history of C Diff Will cover with prednisone for now empirically Start Tessalon Perls take 1 capsule up to 3 times a day as needed for cough May use decongestant, nasal spray Follow-up if not improving, may need trial of albuterol inhaler or other more advanced lung  testing in future   Meds ordered this encounter  Medications  . predniSONE (DELTASONE) 10 MG tablet    Sig: Take 6 tabs with breakfast Day 1, 5 tabs Day 2, 4 tabs Day 3, 3 tabs Day 4, 2 tabs Day 5, 1 tab Day 6.    Dispense:  21 tablet    Refill:  0  . benzonatate (TESSALON) 100 MG capsule    Sig: Take 1 capsule (100 mg total) by mouth 3 (three) times daily as needed for cough.    Dispense:  30 capsule    Refill:  0    Follow up plan: Return in about 1 week (around 07/15/2017), or if symptoms worsen or fail to improve, for eustachian tube ear pain, dyspnea.   Future labs ordered already for 07/17/17 for thyroid follow-up from previous visit, adjust med as needed.  [name], [facility] 07/08/2017, 3:47 PM

## 2017-07-17 ENCOUNTER — Other Ambulatory Visit: Payer: Medicare HMO

## 2017-07-17 DIAGNOSIS — E039 Hypothyroidism, unspecified: Secondary | ICD-10-CM | POA: Diagnosis not present

## 2017-07-17 LAB — TSH: TSH: 6.49 mIU/L — ABNORMAL HIGH (ref 0.40–4.50)

## 2017-07-17 LAB — T4, FREE: FREE T4: 1.2 ng/dL (ref 0.8–1.8)

## 2017-07-24 ENCOUNTER — Encounter: Payer: Self-pay | Admitting: Family Medicine

## 2017-07-24 ENCOUNTER — Ambulatory Visit (INDEPENDENT_AMBULATORY_CARE_PROVIDER_SITE_OTHER): Payer: Medicare HMO | Admitting: Family Medicine

## 2017-07-24 ENCOUNTER — Other Ambulatory Visit: Payer: Self-pay | Admitting: Family Medicine

## 2017-07-24 VITALS — BP 130/77 | HR 65 | Temp 98.2°F | Resp 16 | Ht 66.0 in | Wt 169.0 lb

## 2017-07-24 DIAGNOSIS — N400 Enlarged prostate without lower urinary tract symptoms: Secondary | ICD-10-CM

## 2017-07-24 DIAGNOSIS — E039 Hypothyroidism, unspecified: Secondary | ICD-10-CM | POA: Diagnosis not present

## 2017-07-24 DIAGNOSIS — Z96652 Presence of left artificial knee joint: Secondary | ICD-10-CM | POA: Diagnosis not present

## 2017-07-24 DIAGNOSIS — M159 Polyosteoarthritis, unspecified: Secondary | ICD-10-CM

## 2017-07-24 DIAGNOSIS — I10 Essential (primary) hypertension: Secondary | ICD-10-CM

## 2017-07-24 DIAGNOSIS — R6883 Chills (without fever): Secondary | ICD-10-CM | POA: Diagnosis not present

## 2017-07-24 DIAGNOSIS — E1136 Type 2 diabetes mellitus with diabetic cataract: Secondary | ICD-10-CM

## 2017-07-24 DIAGNOSIS — Z0000 Encounter for general adult medical examination without abnormal findings: Secondary | ICD-10-CM

## 2017-07-24 DIAGNOSIS — R972 Elevated prostate specific antigen [PSA]: Secondary | ICD-10-CM

## 2017-07-24 DIAGNOSIS — E782 Mixed hyperlipidemia: Secondary | ICD-10-CM

## 2017-07-24 DIAGNOSIS — M150 Primary generalized (osteo)arthritis: Secondary | ICD-10-CM

## 2017-07-24 MED ORDER — METOPROLOL TARTRATE 25 MG PO TABS: 12.5000 mg | ORAL_TABLET | Freq: Two times a day (BID) | ORAL | 3 refills | Status: DC

## 2017-07-24 MED ORDER — GLIPIZIDE ER 5 MG PO TB24: 5.0000 mg | ORAL_TABLET | Freq: Every day | ORAL | 3 refills | Status: DC

## 2017-07-24 MED ORDER — GLUCOSE BLOOD VI STRP: ORAL_STRIP | 3 refills | Status: DC

## 2017-07-24 MED ORDER — ACCU-CHEK FASTCLIX LANCETS MISC: 3 refills | Status: DC

## 2017-07-24 MED ORDER — ACCU-CHEK AVIVA PLUS W/DEVICE KIT: PACK | 0 refills | Status: DC

## 2017-07-24 MED ORDER — BD SWAB SINGLE USE REGULAR PADS: MEDICATED_PAD | 3 refills | Status: DC

## 2017-07-24 MED ORDER — BENAZEPRIL-HYDROCHLOROTHIAZIDE 10-12.5 MG PO TABS: 1.0000 | ORAL_TABLET | Freq: Every day | ORAL | 3 refills | Status: DC

## 2017-07-24 MED ORDER — LEVOTHYROXINE SODIUM 50 MCG PO TABS: 50.0000 ug | ORAL_TABLET | Freq: Every day | ORAL | 3 refills | Status: DC

## 2017-07-24 NOTE — Assessment & Plan Note (Signed)
Stable, controlled Refilled meds

## 2017-07-24 NOTE — Assessment & Plan Note (Signed)
Completed handicap placard form today for DMV. Permanent disability with chronic OA/DJD, s/p L TKR and focal dystonia w/ foot drop L. Uses custom support wedge on side of shoe, or cane to assist with ambulation. - Form given to patient today

## 2017-07-24 NOTE — Assessment & Plan Note (Signed)
Previously well controlled Not due for A1c today Refill Glipizide XL daily by request Ordered Accu-chek Aviva Plus glucometer kit, test strips, lancets, pads - 90 day supply, check up to 1 x daily, sent to [facility] mail order by his request

## 2017-07-24 NOTE — Assessment & Plan Note (Signed)
Resolved. Uncertain if it was related to possible temperature changes with mild hypothyroidism, however suspect may have subclinical hypothyroidism, therefore not actually symptomatic. Seems to be resolved after prior C Diff infection cleared and off all antibiotics over time it resolved.

## 2017-07-24 NOTE — Patient Instructions (Addendum)
Thank you for coming to the office today.  Your TSH was elevated again - previous reading was  5.977 - now the lab TSH was up to 6.49 (this is the signal body level)  As discussed, we recommend to increase the dose of Levothyroxine from 88mcg to 98mcg daily now  I have printed new rx Levothyroxine for thyroid higher dose 72mcg - finish your current 25 mcg pills - take TWO of the 69mcg daily about 30 min before breakfast on empty stomach.  Refilled the meds as requested Glipizide, Benazepril, Metoprolol  Handicap placard written  Diabetic testing supplies ordered to [facility], [place] for FASTING BLOOD WORK (no food or drink after midnight before the lab appointment, only water or coffee without cream/sugar on the morning of)  SCHEDULE "Lab Only" visit in the morning at the clinic for lab draw in 3 MONTHS   - Make sure Lab Only appointment is at about 1 week before your next appointment, so that results will be available  For Lab Results, once available within 2-3 days of blood draw, you can can log in to MyChart online to view your results and a brief explanation. Also, we can discuss results at next follow-up visit.       Hipotiroidismo Hypothyroidism El hipotiroidismo es un trastorno de la tiroides. una glndula grande ubicada en la parte anterior e inferior del cuello. La tiroides Administrator hormonas que controlan el funcionamiento del [place]. En los casos de hipotiroidismo, la glndula no produce la cantidad suficiente de estas hormonas. Cules son las causas? Las causas del hipotiroidismo pueden incluir lo siguiente:  Infecciones virales.  Embarazo.  Un ataque del sistema de defensa (sistema inmunitario) a la tiroides.  Ciertos medicamentos.  Defectos congnitos.  Radioterapias anteriores en la cabeza o el cuello.  Tratamiento previo con yodo radioactivo.  Extirpacin quirrgica previa de una parte o de toda la tiroides.  Problemas con la  glndula ubicada en el centro del cerebro (hipfisis).  Cules son los signos o los sntomas? Los signos y los sntomas de hipotiroidismo pueden ser los siguientes:  Sensacin de falta de Teacher, early years/pre ([place]).  Incapacidad para tolerar el fro.  Aumento de peso que no puede explicarse por un cambio en la dieta o en los hbitos de ejercicio fsico.  Piel seca.  Pelo grueso.  Irregularidades menstruales.  Ralentizacin de los procesos de pensamiento.  Estreimiento.  Tristeza o depresin.  Cmo se diagnostica? El mdico puede diagnosticar el hipotiroidismo con anlisis de sangre y Engineer, materials. Cmo se trata? El hipotiroidismo se trata con medicamentos que reemplazan las hormonas que el cuerpo no produce. Despus de Biochemist, clinical, pueden pasar varias semanas hasta la desaparicin de los sntomas. Siga estas instrucciones en su casa:  Tome los medicamentos solamente como se lo haya indicado el mdico.  Si empieza a tomar medicamentos nuevos, infrmele al mdico.  Consulting civil engineer a todas las visitas de control como se lo haya indicado el mdico. Esto es importante. A medida que la enfermedad mejora, es posible que haya que modificar las dosis. Tendr que hacerse anlisis de sangre peridicamente, de modo que el mdico pueda controlar la enfermedad. Comunquese con un mdico si:  Los sntomas no mejoran con Dispensing optician.  Est tomando medicamentos sustitutivos de la tiroides y: ? Copywriter, advertising. ? Siente temblores. ? Est ansioso. ? [place] de peso rpidamente. ? No puede Agricultural engineer. ? Tiene cambios emocionales. ? Tiene diarrea. ? Se siente dbil. Solicite ayuda de inmediato si:  Siente dolor en el pecho.  Tiene latidos cardacos irregulares o siente dolor en el pecho.  Nota que la frecuencia cardaca est acelerada. Esta informacin no tiene Marine scientist el consejo del mdico. Asegrese de hacerle al mdico cualquier pregunta que tenga. Document  Released: 02/17/2005 Document Revised: 05/26/2016 Document Reviewed: 07/05/2013 Elsevier Interactive Patient Education  2018 [employer].   Please schedule a Follow-up Appointment to: Return in about 3 months (around 10/24/2017) for Annual Physical.  If you have any other questions or concerns, please feel free to call the office or send a message through [place]. You may also schedule an earlier appointment if necessary.  Additionally, you may be receiving a survey about your experience at our office within a few days to 1 week by e-mail or mail. We value your feedback.  [name], DO [place]

## 2017-07-24 NOTE — Assessment & Plan Note (Signed)
Clinically mostly asymptomatic. Some mild weight gain by report, approx +6-7 lbs in 3 months. Other possible related symptoms chills resolved, suspect not related to thyroid. - Recent diagnosis after elevated TSH was identified outside provider, 5.9 now up to 6.49 despite starting Levothyroxine 37mcg daily  Plan Discussion today regarding hypothyroidism, etiology and diagnosis. No known fam history. Clinical exam is benign of thyroid. Reviewed potential options - patient hesitant to take new medication daily if he does not feel symptoms. He is concern about weight gain though. - Agree to try increased dose for Levothyroxine 40mcg daily given elevated TSH, for 3 months, will repeat all labs including thyroid TSH Free T4 in 3 months, if his labs are unchanged or still slightly abnormal and if his weight is not improving, will ultimately discontinue med for now and monitor lab level in future, - he may consider 2nd opinion from Endocrinologist in future

## 2017-07-24 NOTE — Progress Notes (Signed)
Subjective:    Patient ID: Ross Riley, male    DOB: 05/12/1950, 67 y.o.   MRN: [id number]  Ross Riley is a 67 y.o. male presenting on 07/24/2017 for Hypothyroidism  Patient provides majority of history. Accompanied by his daughter, who provides additional history, and also is able to assist with interpretation in Spanish.  HPI    Diabetes, Type 2 No new concerns today - request med refill and test supplies Accu-chek Meds: Glipizide XL 49m daily Reports good compliance. Tolerating well w/o side-effects Currently on ACEi, ASA, Statin Lifestyle: - Diet (balanced diet) - Exercise (limited activity due to chronic LLE issues see note) Denies hypoglycemia, polyuria, visual changes, numbness or tingling.  Chronic Left Lower ExtL ankleDystonia / Osteoarthritis knees s/p L TKR - Specifically regarding Left lower extremity dystonia andinstability. He has known history of osteoarthritis from prior imaging in 2016. Previously followed by Orthopedics in [place] He is currently s/p L TKR and wears AFO for chronic L ankle dystonia - he has had Diabetic Shoes and Inserts ordered last 12/2016 - He wears AFO and lateral shoe wedge externally, able to ambulate w/o cane - Wears compression knee sleeve with improvement in stability - Requests handicap placard form - Denies fall, trauma, redness, other joint problem  Elevated TSH vs Hypothyroidism [facility] due to persistent episodic chills that have been present with C Diff, he has been on vancomycin prolonged course due to this, but states chills before. He was advised to stay off all antibiotics due to C Diff for while now, and also had UA tested negative and TSH tested that was mild elevated 5.977. No prior dx of hypothyroidism - Interval update - he was started on Levothyroxine 238m daily by me on 05/22/17. Repeat TSH level is actually elevated 6.49 now in 07/2017, with normal Free T4 - Today  he reports uncertain if medicine is helping. He read about thyroid. He admits some weight gain still. He does not endorse significant fatigue or reduced energy - No known prior thyroid problem or family history - Denies any further chills - he reports these have RESOLVED in past, uncertain if due to thyroid medicine or something else  HTN - needs refill  [facility] 07/24/2017 04/07/2017 11/24/2016  Decreased Interest 0 1 0  Down, Depressed, Hopeless 0 1 0  PHQ - 2 Score 0 2 0  Altered sleeping 0 0 -  Tired, decreased energy 0 1 -  Change in appetite 0 0 -  Feeling bad or failure about yourself  0 1 -  Trouble concentrating 0 0 -  Moving slowly or fidgety/restless 0 0 -  Suicidal thoughts 0 0 -  PHQ-9 Score 0 4 -  Difficult doing work/chores Not difficult at all Not difficult at all -    Social History   Tobacco Use  . Smoking status: Former Smoker    Packs/day: 1.00    Years: 32.00    Pack years: 32.00    Types: Cigarettes    Last attempt to quit: 03/04/1991    Years since quitting: 26.4  . Smokeless tobacco: Never Used  Substance Use Topics  . Alcohol use: Yes    Alcohol/week: 0.0 oz    Comment: occasional  . Drug use: No    Review of Systems Per HPI unless specifically indicated above     Objective:    BP 130/77   Pulse 65   Temp 98.2 F (36.8  C) (Oral)   Resp 16   Ht _0  (1.676 m)   Wt 169 lb (76.7 kg)   BMI 27.28 kg/m   Wt Readings from Last 3 Encounters:  07/24/17 169 lb (76.7 kg)  07/08/17 170 lb (77.1 kg)  05/22/17 163 lb 12.8 oz (74.3 kg)    Physical Exam  Constitutional: He is oriented to person, place, and time. He appears well-developed and well-nourished. No distress.  Well-appearing, comfortable, cooperative  HENT:  Head: Normocephalic and atraumatic.  Mouth/Throat: Oropharynx is clear and moist.  Eyes: Conjunctivae are normal. Right eye exhibits no discharge. Left eye exhibits no discharge.  Neck: Normal range of motion. Neck  supple. No thyromegaly (no palpable nodules) present.  Cardiovascular: Normal rate, regular rhythm, normal heart sounds and intact distal pulses.  No murmur heard. Pulmonary/Chest: Effort normal and breath sounds normal. No respiratory distress. He has no wheezes. He has no rales.  Musculoskeletal: He exhibits no edema.  S/p L TKR, some reduced ROM Ambulates without assistance of crutch or cane, has large lateral external shoe wedge custom on L shoe  Lymphadenopathy:    He has no cervical adenopathy.  Neurological: He is alert and oriented to person, place, and time. He exhibits abnormal muscle tone (chronic L ankle dystonia unchanged).  Skin: Skin is warm and dry. No rash noted. He is not diaphoretic. No erythema.  Psychiatric: He has a normal mood and affect. His behavior is normal.  Well groomed, good eye contact, normal speech and thoughts  Nursing note and vitals reviewed.  Results for orders placed or performed in visit on 07/17/17  T4, free  Result Value Ref Range   Free T4 1.2 0.8 - 1.8 ng/dL  TSH  Result Value Ref Range   TSH 6.49 (H) 0.40 - 4.50 mIU/L      Assessment & Plan:   Problem List Items Addressed This Visit    Chills (without fever)    Resolved. Uncertain if it was related to possible temperature changes with mild hypothyroidism, however suspect may have subclinical hypothyroidism, therefore not actually symptomatic. Seems to be resolved after prior C Diff infection cleared and off all antibiotics over time it resolved.      Essential hypertension    Stable, controlled Refilled meds      Relevant Medications   benazepril-hydrochlorthiazide (LOTENSIN HCT) 10-12.5 MG tablet   metoprolol tartrate (LOPRESSOR) 25 MG tablet   Hypothyroidism - Primary    Clinically mostly asymptomatic. Some mild weight gain by report, approx +6-7 lbs in 3 months. Other possible related symptoms chills resolved, suspect not related to thyroid. - Recent diagnosis after elevated  TSH was identified outside provider, 5.9 now up to 6.49 despite starting Levothyroxine 52mg daily  Plan Discussion today regarding hypothyroidism, etiology and diagnosis. No known fam history. Clinical exam is benign of thyroid. Reviewed potential options - patient hesitant to take new medication daily if he does not feel symptoms. He is concern about weight gain though. - Agree to try increased dose for Levothyroxine 552m daily given elevated TSH, for 3 months, will repeat all labs including thyroid TSH Free T4 in 3 months, if his labs are unchanged or still slightly abnormal and if his weight is not improving, will ultimately discontinue med for now and monitor lab level in future, - he may consider 2nd opinion from Endocrinologist in future      Relevant Medications   metoprolol tartrate (LOPRESSOR) 25 MG tablet   levothyroxine (SYNTHROID, LEVOTHROID) 50 MCG tablet  S/P TKR (total knee replacement), left    Completed handicap placard form today for DMV. Permanent disability with chronic OA/DJD, s/p L TKR and focal dystonia w/ foot drop L. Uses custom support wedge on side of shoe, or cane to assist with ambulation. - Form given to patient today      Type 2 diabetes mellitus with diabetic cataract, without long-term current use of insulin ([place])    Previously well controlled Not due for A1c today Refill Glipizide XL daily by request Ordered Accu-chek Aviva Plus glucometer kit, test strips, lancets, pads - 90 day supply, check up to 1 x daily, sent to [facility] mail order by his request      Relevant Medications   glipiZIDE (GLUCOTROL XL) 5 MG 24 hr tablet   benazepril-hydrochlorthiazide (LOTENSIN HCT) 10-12.5 MG tablet   glucose blood test strip   Blood Glucose Monitoring Suppl (ACCU-CHEK AVIVA PLUS) w/Device KIT   ACCU-CHEK FASTCLIX LANCETS MISC   Alcohol Swabs (B-D SINGLE USE SWABS REGULAR) PADS      Meds ordered this encounter  Medications  . glipiZIDE (GLUCOTROL XL) 5 MG 24 hr  tablet    Sig: Take 1 tablet (5 mg total) by mouth daily with breakfast.    Dispense:  90 tablet    Refill:  3  . benazepril-hydrochlorthiazide (LOTENSIN HCT) 10-12.5 MG tablet    Sig: Take 1 tablet by mouth daily.    Dispense:  90 tablet    Refill:  3  . metoprolol tartrate (LOPRESSOR) 25 MG tablet    Sig: Take 0.5 tablets (12.5 mg total) by mouth 2 (two) times daily.    Dispense:  90 tablet    Refill:  3  . levothyroxine (SYNTHROID, LEVOTHROID) 50 MCG tablet    Sig: Take 1 tablet (50 mcg total) by mouth daily before breakfast. 30 min before    Dispense:  30 tablet    Refill:  3  . glucose blood test strip    Sig: check blood sugar up to 1 time daily as directed    Dispense:  100 each    Refill:  3    DX: E11.36. Accu-chek AVIVA PLUS TEST STRIP  . Blood Glucose Monitoring Suppl (ACCU-CHEK AVIVA PLUS) w/Device KIT    Sig: Use device to check blood sugar up to 1 time daily as directed    Dispense:  1 kit    Refill:  0    DX: E11.36  . ACCU-CHEK FASTCLIX LANCETS MISC    Sig: check blood sugar up to 1 time daily as directed    Dispense:  102 each    Refill:  3    DX: E11.36  . Alcohol Swabs (B-D SINGLE USE SWABS REGULAR) PADS    Sig: check blood sugar up to 1 time daily as directed    Dispense:  100 each    Refill:  3    DX: E11.36    Follow up plan: Return in about 3 months (around 10/24/2017) for Annual Physical.  Future labs ordered for 10/20/17 - including repeat thyroid check and A1c.  [name], [facility] 07/24/2017, 5:23 PM

## 2017-07-28 DIAGNOSIS — Z96652 Presence of left artificial knee joint: Secondary | ICD-10-CM | POA: Diagnosis not present

## 2017-07-28 DIAGNOSIS — Z79899 Other long term (current) drug therapy: Secondary | ICD-10-CM | POA: Diagnosis not present

## 2017-07-28 DIAGNOSIS — M25562 Pain in left knee: Secondary | ICD-10-CM | POA: Diagnosis not present

## 2017-07-28 DIAGNOSIS — M1712 Unilateral primary osteoarthritis, left knee: Secondary | ICD-10-CM | POA: Diagnosis not present

## 2017-07-28 DIAGNOSIS — R200 Anesthesia of skin: Secondary | ICD-10-CM | POA: Diagnosis not present

## 2017-08-12 ENCOUNTER — Ambulatory Visit (INDEPENDENT_AMBULATORY_CARE_PROVIDER_SITE_OTHER): Payer: Medicare HMO | Admitting: Family Medicine

## 2017-08-12 ENCOUNTER — Encounter: Payer: Self-pay | Admitting: Family Medicine

## 2017-08-12 ENCOUNTER — Ambulatory Visit
Admission: RE | Admit: 2017-08-12 | Discharge: 2017-08-12 | Disposition: A | Discharge status: Home / Self Care | Payer: Medicare HMO | Source: Ambulatory Visit | Attending: Family Medicine | Admitting: Family Medicine

## 2017-08-12 ENCOUNTER — Telehealth: Payer: Self-pay | Admitting: Family Medicine

## 2017-08-12 ENCOUNTER — Other Ambulatory Visit: Payer: Self-pay | Admitting: Family Medicine

## 2017-08-12 VITALS — BP 129/78 | HR 54 | Temp 98.8°F | Resp 16 | Ht 66.0 in | Wt 171.0 lb

## 2017-08-12 DIAGNOSIS — M7551 Bursitis of right shoulder: Secondary | ICD-10-CM

## 2017-08-12 DIAGNOSIS — M25511 Pain in right shoulder: Secondary | ICD-10-CM | POA: Insufficient documentation

## 2017-08-12 DIAGNOSIS — G8929 Other chronic pain: Secondary | ICD-10-CM

## 2017-08-12 DIAGNOSIS — M7989 Other specified soft tissue disorders: Secondary | ICD-10-CM | POA: Diagnosis not present

## 2017-08-12 DIAGNOSIS — R937 Abnormal findings on diagnostic imaging of other parts of musculoskeletal system: Secondary | ICD-10-CM | POA: Diagnosis not present

## 2017-08-12 MED ORDER — BACLOFEN 10 MG PO TABS: 5.0000 mg | ORAL_TABLET | Freq: Three times a day (TID) | ORAL | 0 refills | Status: DC | PRN

## 2017-08-12 MED ORDER — NAPROXEN 500 MG PO TABS: 500.0000 mg | ORAL_TABLET | Freq: Two times a day (BID) | ORAL | 0 refills | Status: DC

## 2017-08-12 NOTE — Telephone Encounter (Signed)
Spoke with Mr. Diffee. He needed transportation to [facility], I encouraged him to apply for Medicaid referring back to conversation I had with Pt's daughter. Gave CJ's transportation phone # and the [place] to set up transportation in the interim. knb

## 2017-08-12 NOTE — Progress Notes (Addendum)
Subjective:    Patient ID: Ross Riley, male    DOB: 02-Jul-1950, 67 y.o.   MRN: [id number]  BROK Riley is a 67 y.o. male presenting on 08/12/2017 for Shoulder Pain (right side and has some inflammation onset 3 days)   HPI   Right Shoulder Pain, Acute on Chronic Reports prior problem R shoulder patient about 3 months ago, without obvious known injury, with some gradual worsening episodic painful episodes, then improved. Now recently over past 3 days has noticed more persistent pain and "inflammation" has some localized swelling he locates anterior shoulder. He does not do any specific repetitive activity that he endorses, no longer using crutches. He has pain in anterior shoulder AC area with lifting arm up and out away from body and it limits his activity at times - No prior imaging or x-ray, no prior treatment for shoulder - No longer taking NSAID Naproxen, but has taken this in past. Not on muscle relaxant - Denies any injury, trauma, fall, numbness, tingling weakness   Depression screen [facility] 2/9 08/12/2017 07/24/2017 04/07/2017  Decreased Interest 0 0 1  Down, Depressed, Hopeless 0 0 1  PHQ - 2 Score 0 0 2  Altered sleeping 0 0 0  Tired, decreased energy 0 0 1  Change in appetite 0 0 0  Feeling bad or failure about yourself  0 0 1  Trouble concentrating 0 0 0  Moving slowly or fidgety/restless 0 0 0  Suicidal thoughts 0 0 0  PHQ-9 Score 0 0 4  Difficult doing work/chores Not difficult at all Not difficult at all Not difficult at all    Social History   Tobacco Use  . Smoking status: Former Smoker    Packs/day: 1.00    Years: 32.00    Pack years: 32.00    Types: Cigarettes    Last attempt to quit: 03/04/1991    Years since quitting: 26.4  . Smokeless tobacco: Never Used  Substance Use Topics  . Alcohol use: Yes    Alcohol/week: 0.0 oz    Comment: occasional  . Drug use: No    Review of Systems Per HPI unless specifically indicated above     Objective:      BP 129/78   Pulse (!) 54   Temp 98.8 F (37.1 C) (Oral)   Resp 16   Ht 5\' 6"  (1.676 m)   Wt 171 lb (77.6 kg)   BMI 27.60 kg/m   Wt Readings from Last 3 Encounters:  08/12/17 171 lb (77.6 kg)  07/24/17 169 lb (76.7 kg)  07/08/17 170 lb (77.1 kg)    Physical Exam  Constitutional: He is oriented to person, place, and time. He appears well-developed and well-nourished. No distress.  Well-appearing, comfortable, cooperative  HENT:  Head: Normocephalic and atraumatic.  Mouth/Throat: Oropharynx is clear and moist.  Eyes: Conjunctivae are normal. Right eye exhibits no discharge. Left eye exhibits no discharge.  Cardiovascular: Normal rate.  Pulmonary/Chest: Effort normal.  Musculoskeletal: He exhibits edema (soft palpable fullness or localized edema non tender or erythematous - localized Right anterior clavicular region near neck, no nodule palpable).  Right Shoulder Inspection: Normal appearance bilateral symmetrical - except noted under edema with soft tissue swelling R anteriorly Palpation: Mild tender over anterior AC joint - otherwise Non-tender to palpation over lateral, or posterior shoulder  ROM: Mostly full Active ROM but has some mild limitation full forward flexion and abduction due to some discomfort. Mostly intact internal rotation Special Testing: Rotator cuff  testing negative for weakness with supraspinatus full can and empty can test but some discomfort or pain present, Hawkin's AC impingement positive for pain Strength: Normal strength 5/5 flex/ext, ext rot / int rot, grip, rotator cuff str testing. Neurovascular: Distally intact pulses, sensation to light touch   Neurological: He is alert and oriented to person, place, and time.  Skin: Skin is warm and dry. No rash noted. He is not diaphoretic. No erythema.  Psychiatric: He has a normal mood and affect. His behavior is normal.  Well groomed, good eye contact, normal speech and thoughts  Nursing note and vitals  reviewed.  Results for orders placed or performed in visit on 07/17/17  T4, free  Result Value Ref Range   Free T4 1.2 0.8 - 1.8 ng/dL  TSH  Result Value Ref Range   TSH 6.49 (H) 0.40 - 4.50 mIU/L   I have personally reviewed the radiology report from Right Shoulder X-ray on 08/12/17.  CLINICAL DATA:  Pain with soft tissue swelling.  No trauma.  EXAM: RIGHT SHOULDER - 2+ VIEW  COMPARISON:  None.  FINDINGS: No joint dislocation identified. There is a well corticated calcification anterior and medial to the humeral head. Given the well corticated appearance on the axillary view and the lack of trauma, this likely represents a loose body. No acute fracture identified.  IMPRESSION: A well corticated calcification anterior and medial to the humeral head is suspected to represent a loose body. No evidence of acute fracture or dislocation.   Electronically Signed   By: Dorise Bullion III M.D   On: 08/12/2017 14:20     Assessment & Plan:   Problem List Items Addressed This Visit    Chronic right shoulder pain   Relevant Medications   naproxen (NAPROSYN) 500 MG tablet   baclofen (LIORESAL) 10 MG tablet   Other Relevant Orders   DG Shoulder Right    Other Visit Diagnoses    Acute bursitis of right shoulder    -  Primary   Relevant Medications   naproxen (NAPROSYN) 500 MG tablet   baclofen (LIORESAL) 10 MG tablet   Other Relevant Orders   DG Shoulder Right      Consistent with acute on chronic R-shoulder bursitis vs rotator cuff tendinopathy with some reduced active ROM but without significant evidence of muscle tear (no weakness). Without obvious trigger or repetitive activity or injury. - 68 year old patient with known OA/DJD in other joints, suspected contributing factor - No imaging on chart of shoulder  Plan: - Check X-ray today R shoulder - pending result 1. Start rx Naproxen 500mg  twice daily (with food) for 2-4 weeks, then as needed - Start Baclofen  5-10mg  TID PRN muscle relaxant - caution sedation 2. May take Tylenol Ex Str 1-2 q 6 hr PRN 3. Relative rest but keep shoulder mobile, demonstrated ROM exercises, avoid heavy lifting 4. May try heating pad PRN 5. Follow-up 4-6 weeks if not improved for re-evaluation, consider referral to Physical Therapy, and or subacromial steroid injection   **UPDATE 08/12/17 5:21pm - Reviewed R Shoulder X-ray result, see above, added to note, it shows some degenerative changes only mild on review but without acute fracture or dislocation. Additionally a potential loose body or calcification is identified anterior to humeral head, reviewed images as well, this is not in area of his localized swelling, as this is lateral shoulder and his swelling was more medial near neck. - Called his daughter Dedra Skeens and reviewed x-ray results and treatment  plan, she will update him later, suspect loose body may be unrelated, he does not have known trauma or old injury, most likely diagnosis is arthritis with bursitis to explain episodic flare ups - Continue treatment, follow-up as advised if not improving   Meds ordered this encounter  Medications  . naproxen (NAPROSYN) 500 MG tablet    Sig: Take 1 tablet (500 mg total) by mouth 2 (two) times daily with a meal. For 2 to 4 weeks    Dispense:  180 tablet    Refill:  0    **Patient requests 90 days supply**  . baclofen (LIORESAL) 10 MG tablet    Sig: Take 0.5-1 tablets (5-10 mg total) by mouth 3 (three) times daily as needed for muscle spasms.    Dispense:  90 each    Refill:  0    90 day supply requested    Follow up plan: Return in about 1 month (around 09/09/2017) for Right shoulder pain, may need injection.  [name], [facility] 08/12/2017, 1:01 PM

## 2017-08-12 NOTE — Patient Instructions (Addendum)
Thank you for coming to the office today.  You most likely have bursitis of R shoulder  This can cause swelling and pain with movement  Recommend trial of Anti-inflammatory with Naproxen (Naprosyn) 500mg  tabs - take one with food and plenty of water TWICE daily every day (breakfast and dinner), for next 2 to 4 weeks, then you may take only as needed - DO NOT TAKE any ibuprofen, aleve, motrin while you are taking this medicine - It is safe to take Tylenol Ext Str 500mg  tabs - take 1 to 2 (max dose 1000mg ) every 6 hours as needed for breakthrough pain, max 24 hour daily dose is 6 to 8 tablets or 4000mg   Start taking Baclofen (Lioresal) 10mg  (muscle relaxant) - start with one pill at night as needed for next 1-3 nights (may make you drowsy, caution with driving) see how it affects you, then if tolerated increase to one pill 2 to 3 times a day or (every 8 hours as needed)  Try ice packs and heating pad as needed  We will check X-ray today to see if any arthritis or other injury to bone   Please schedule a Follow-up Appointment to: Return in about 1 month (around 09/09/2017) for Right shoulder pain, may need injection.   Range of Motion Shoulder Exercises  Pendulum Circles - Lean with your good arm against a counter or table for support - Bend forward with a wide stance (make sure your body is comfortable) - Your painful shoulder should hang down and feel "heavy" - Gently move your painful arm in small circles "clockwise" for several turns - Switch to "counterclockwise" for several turns - Early on keep circles narrow and move slowly - Later in rehab, move in larger circles and faster movement   Wall Crawl - Stand close (about 1-2 ft away) to a wall, facing it directly - Reach out with your arm of painful shoulder and place fingers (not palm) on wall - You should make contact with wall at your waist level - Slowly walk your fingers up the wall. Stay in contact with wall entire time, do  not remove fingers - Keep walking fingers up wall until you reach shoulder level - You may feel tightening or mild discomfort, once you reach a height that causes pain or if you are already above your shoulder height then stop. Repeat from starting position. - Early on stand closer to wall, move fingers slowly, and stay at or below shoulder level - Later in rehab, stand farther away from wall (fingertips), move fingers quicker, go above shoulder level    If you have any other questions or concerns, please feel free to call the office or send a message through [employer]. You may also schedule an earlier appointment if necessary.  Additionally, you may be receiving a survey about your experience at our office within a few days to 1 week by e-mail or mail. We value your feedback.  [name], DO [place]

## 2017-08-13 ENCOUNTER — Telehealth: Payer: Self-pay | Admitting: Family Medicine

## 2017-08-13 NOTE — Telephone Encounter (Signed)
Yesterday 6/12 I have already called patient's other daughter, Dedra Skeens with X-ray results and reviewed these with her in detail. She as planning to share this with patient.  The X-ray showed mild arthritis of shoulder. There was no fracture. There is an additional abnormality with a "loose body" or calcified area in front of shoulder. This is not likely cause of his symptoms, but it is sign that he could have old injury or other wear on the muscles.  Continue treatment, follow-up as advised if not improving  [name], DO [facility] 08/13/2017, 3:46 PM

## 2017-08-13 NOTE — Telephone Encounter (Signed)
Please call Marla with xray results [phone]

## 2017-08-17 ENCOUNTER — Other Ambulatory Visit: Payer: Self-pay | Admitting: Family Medicine

## 2017-08-17 DIAGNOSIS — K219 Gastro-esophageal reflux disease without esophagitis: Secondary | ICD-10-CM

## 2017-09-10 ENCOUNTER — Other Ambulatory Visit: Payer: Self-pay | Admitting: Family Medicine

## 2017-09-10 DIAGNOSIS — I10 Essential (primary) hypertension: Secondary | ICD-10-CM

## 2017-09-10 MED ORDER — BENAZEPRIL-HYDROCHLOROTHIAZIDE 10-12.5 MG PO TABS: 1.0000 | ORAL_TABLET | Freq: Every day | ORAL | 3 refills | Status: DC

## 2017-09-10 NOTE — Telephone Encounter (Signed)
Pt called requesting refill on benazepril called into  [facility]

## 2017-09-14 DIAGNOSIS — Z471 Aftercare following joint replacement surgery: Secondary | ICD-10-CM | POA: Diagnosis not present

## 2017-09-14 DIAGNOSIS — Z96652 Presence of left artificial knee joint: Secondary | ICD-10-CM | POA: Diagnosis not present

## 2017-09-15 DIAGNOSIS — E119 Type 2 diabetes mellitus without complications: Secondary | ICD-10-CM | POA: Diagnosis not present

## 2017-09-15 DIAGNOSIS — H401132 Primary open-angle glaucoma, bilateral, moderate stage: Secondary | ICD-10-CM | POA: Diagnosis not present

## 2017-09-15 DIAGNOSIS — H524 Presbyopia: Secondary | ICD-10-CM | POA: Diagnosis not present

## 2017-09-15 LAB — HM DIABETES EYE EXAM

## 2017-09-18 ENCOUNTER — Telehealth: Payer: Self-pay | Admitting: Family Medicine

## 2017-09-18 DIAGNOSIS — E039 Hypothyroidism, unspecified: Secondary | ICD-10-CM

## 2017-09-18 NOTE — Telephone Encounter (Signed)
Last seen 07/24/17 for hypothyroidism vs elevated TSH, his dose of Levothyroxine was adjusted from 25 to 40mcg daily.  Now he is requesting referral to Endocrinology for 2nd opinion on this.  Will place referral to local Endocrinology [facility] in [place] - only other endocrine is out of town in [place].  FYI - please notify patient / family that referral was placed to [facility] Endocrinology - stay tuned for appointment he may call [phone] to check status  [name], [facility] 09/18/2017, 4:42 PM

## 2017-09-18 NOTE — Telephone Encounter (Signed)
Pt daughter called requesting a referral to thyroid specialist

## 2017-09-21 NOTE — Telephone Encounter (Signed)
The pt daughter was notified. No questions or concerns.

## 2017-10-01 DIAGNOSIS — E039 Hypothyroidism, unspecified: Secondary | ICD-10-CM | POA: Diagnosis not present

## 2017-10-01 DIAGNOSIS — E139 Other specified diabetes mellitus without complications: Secondary | ICD-10-CM | POA: Diagnosis not present

## 2017-10-09 ENCOUNTER — Telehealth: Payer: Self-pay | Admitting: Family Medicine

## 2017-10-09 NOTE — Telephone Encounter (Signed)
Ross Riley called requesting that you call him back for NPA  For this pt. Ross Riley call back # is  [phone] ext [phone]

## 2017-10-09 NOTE — Telephone Encounter (Signed)
Group and provider NPI # was given to [facility] as per Christy Sartorius patient has appointment on 11/04/2017. Patient daughter also notified and number for given to her.

## 2017-10-14 DIAGNOSIS — L918 Other hypertrophic disorders of the skin: Secondary | ICD-10-CM | POA: Diagnosis not present

## 2017-10-14 DIAGNOSIS — L570 Actinic keratosis: Secondary | ICD-10-CM | POA: Diagnosis not present

## 2017-10-14 DIAGNOSIS — L720 Epidermal cyst: Secondary | ICD-10-CM | POA: Diagnosis not present

## 2017-10-14 DIAGNOSIS — L02212 Cutaneous abscess of back [any part, except buttock]: Secondary | ICD-10-CM | POA: Diagnosis not present

## 2017-10-20 ENCOUNTER — Other Ambulatory Visit: Payer: Medicare HMO

## 2017-10-20 DIAGNOSIS — E782 Mixed hyperlipidemia: Secondary | ICD-10-CM | POA: Diagnosis not present

## 2017-10-20 DIAGNOSIS — E1136 Type 2 diabetes mellitus with diabetic cataract: Secondary | ICD-10-CM | POA: Diagnosis not present

## 2017-10-20 DIAGNOSIS — Z0000 Encounter for general adult medical examination without abnormal findings: Secondary | ICD-10-CM | POA: Diagnosis not present

## 2017-10-20 DIAGNOSIS — M150 Primary generalized (osteo)arthritis: Secondary | ICD-10-CM | POA: Diagnosis not present

## 2017-10-20 DIAGNOSIS — I10 Essential (primary) hypertension: Secondary | ICD-10-CM | POA: Diagnosis not present

## 2017-10-20 DIAGNOSIS — N400 Enlarged prostate without lower urinary tract symptoms: Secondary | ICD-10-CM | POA: Diagnosis not present

## 2017-10-20 DIAGNOSIS — E039 Hypothyroidism, unspecified: Secondary | ICD-10-CM | POA: Diagnosis not present

## 2017-10-20 DIAGNOSIS — R972 Elevated prostate specific antigen [PSA]: Secondary | ICD-10-CM | POA: Diagnosis not present

## 2017-10-21 LAB — LIPID PANEL
CHOL/HDL RATIO: 2.5 (calc) (ref <5.0)
Cholesterol: 157 mg/dL (ref <200)
HDL: 62 mg/dL (ref >40)
LDL CHOLESTEROL (CALC): 80 mg/dL
NON-HDL CHOLESTEROL (CALC): 95 mg/dL (ref <130)
Triglycerides: 70 mg/dL (ref <150)

## 2017-10-21 LAB — CBC WITH DIFFERENTIAL/PLATELET
BASOS PCT: 0.6 %
Basophils Absolute: 56 cells/uL (ref 0–200)
Eosinophils Absolute: 47 cells/uL (ref 15–500)
Eosinophils Relative: 0.5 %
HCT: 46.8 % (ref 38.5–50.0)
Hemoglobin: 15.7 g/dL (ref 13.2–17.1)
Lymphs Abs: 4521 cells/uL — ABNORMAL HIGH (ref 850–3900)
MCH: 29.8 pg (ref 27.0–33.0)
MCHC: 33.5 g/dL (ref 32.0–36.0)
MCV: 88.8 fL (ref 80.0–100.0)
MPV: 10.7 fL (ref 7.5–12.5)
Monocytes Relative: 9.4 %
Neutro Abs: 3892 cells/uL (ref 1500–7800)
Neutrophils Relative %: 41.4 %
PLATELETS: 219 10*3/uL (ref 140–400)
RBC: 5.27 10*6/uL (ref 4.20–5.80)
RDW: 12.9 % (ref 11.0–15.0)
TOTAL LYMPHOCYTE: 48.1 %
WBC: 9.4 10*3/uL (ref 3.8–10.8)
WBCMIX: 884 {cells}/uL (ref 200–950)

## 2017-10-21 LAB — COMPLETE METABOLIC PANEL WITH GFR
AG Ratio: 1.3 (calc) (ref 1.0–2.5)
ALKALINE PHOSPHATASE (APISO): 67 U/L (ref 40–115)
ALT: 20 U/L (ref 9–46)
AST: 15 U/L (ref 10–35)
Albumin: 4.1 g/dL (ref 3.6–5.1)
BUN: 15 mg/dL (ref 7–25)
CO2: 28 mmol/L (ref 20–32)
CREATININE: 1.07 mg/dL (ref 0.70–1.25)
Calcium: 8.9 mg/dL (ref 8.6–10.3)
Chloride: 102 mmol/L (ref 98–110)
GFR, Est African American: 83 mL/min/{1.73_m2} (ref >=60)
GFR, Est Non African American: 72 mL/min/{1.73_m2} (ref >=60)
Globulin: 3.1 g/dL (calc) (ref 1.9–3.7)
Glucose, Bld: 99 mg/dL (ref 65–99)
Potassium: 4.2 mmol/L (ref 3.5–5.3)
SODIUM: 145 mmol/L (ref 135–146)
Total Bilirubin: 0.5 mg/dL (ref 0.2–1.2)
Total Protein: 7.2 g/dL (ref 6.1–8.1)

## 2017-10-21 LAB — PSA, TOTAL WITH REFLEX TO PSA, FREE: PSA, Total: 1.5 ng/mL (ref <=4.0)

## 2017-10-21 LAB — HEMOGLOBIN A1C
EAG (MMOL/L): 8.1 (calc)
Hgb A1c MFr Bld: 6.7 % of total Hgb — ABNORMAL HIGH (ref <5.7)
MEAN PLASMA GLUCOSE: 146 (calc)

## 2017-10-21 LAB — TSH: TSH: 2.88 mIU/L (ref 0.40–4.50)

## 2017-10-21 LAB — T4, FREE: Free T4: 1.2 ng/dL (ref 0.8–1.8)

## 2017-10-26 ENCOUNTER — Encounter: Payer: Self-pay | Admitting: Family Medicine

## 2017-10-26 ENCOUNTER — Encounter: Payer: Medicare HMO | Admitting: Family Medicine

## 2017-10-26 ENCOUNTER — Ambulatory Visit (INDEPENDENT_AMBULATORY_CARE_PROVIDER_SITE_OTHER): Payer: Medicare HMO | Admitting: Family Medicine

## 2017-10-26 VITALS — BP 113/65 | HR 51 | Temp 97.8°F | Resp 16 | Ht 66.0 in | Wt 172.0 lb

## 2017-10-26 DIAGNOSIS — G248 Other dystonia: Secondary | ICD-10-CM

## 2017-10-26 DIAGNOSIS — Z0000 Encounter for general adult medical examination without abnormal findings: Secondary | ICD-10-CM

## 2017-10-26 DIAGNOSIS — E039 Hypothyroidism, unspecified: Secondary | ICD-10-CM | POA: Diagnosis not present

## 2017-10-26 DIAGNOSIS — I10 Essential (primary) hypertension: Secondary | ICD-10-CM | POA: Diagnosis not present

## 2017-10-26 DIAGNOSIS — K219 Gastro-esophageal reflux disease without esophagitis: Secondary | ICD-10-CM | POA: Diagnosis not present

## 2017-10-26 DIAGNOSIS — E782 Mixed hyperlipidemia: Secondary | ICD-10-CM | POA: Diagnosis not present

## 2017-10-26 DIAGNOSIS — E1136 Type 2 diabetes mellitus with diabetic cataract: Secondary | ICD-10-CM

## 2017-10-26 DIAGNOSIS — D420 Neoplasm of uncertain behavior of cerebral meninges: Secondary | ICD-10-CM

## 2017-10-26 NOTE — Progress Notes (Signed)
Subjective:    Patient ID: Ross Riley, male    DOB: 01-01-1951, 67 y.o.   MRN: [id number]  Ross Riley is a 67 y.o. male presenting on 10/26/2017 for Annual Exam   HPI   Here for Annual Physical and Lab Review  Hypothyroidism Followed now by [facility] Endocr[facility] seen on 8/1 for initial consult, checking labs, continues on Levothyroxine 53mg daily, doing well. Last TSH normal range.  GERD Chronic problem, had been on combination treatment with PPI and H2, was advised to reduce meds. Not having refractory symptoms, was controlled on PPI  Diabetes, Type 2 No concerns today Meds:Glipizide XL '5mg'$  daily Reports good compliance. Tolerating well w/o side-effects Currently on ACEi, ASA, Statin Lifestyle: - Diet (balanced diet) - Exercise (limited activity due to chronic LLE issues see note)  CHRONIC HTN / Hyperlipidemia Reports no new concerns. Current Meds - Metoprolol 12.'5mg'$  BID (Half of '25mg'$ )   - Taking Atorvastatin '20mg'$  daily for HLD Reports good compliance, took meds today. Tolerating well, w/o complaints.  PMH - Chronic Left Lower ExtL ankleDystonia / Osteoarthritis knees s/p L TKR - limited ambulation, cannot walk >200 ft w/o stopping to rest. Due for handicap placard renewal   Health Maintenance: Due for Flu, will return  [facility] 10/26/2017 08/12/2017 07/24/2017  Decreased Interest 0 0 0  Down, Depressed, Hopeless 0 0 0  PHQ - 2 Score 0 0 0  Altered sleeping - 0 0  Tired, decreased energy - 0 0  Change in appetite - 0 0  Feeling bad or failure about yourself  - 0 0  Trouble concentrating - 0 0  Moving slowly or fidgety/restless - 0 0  Suicidal thoughts - 0 0  PHQ-9 Score - 0 0  Difficult doing work/chores - Not difficult at all Not difficult at all    Past Medical History:  Diagnosis Date  . Arthritis   . BPH (benign prostatic hyperplasia)   . Chronic kidney disease    kidney stones  . Dystonia   . ED (erectile dysfunction)     . GERD (gastroesophageal reflux disease)   . Glaucoma (increased eye pressure)   . Hematuria   . Hyperlipidemia    Past Surgical History:  Procedure Laterality Date  . EXTRACORPOREAL SHOCK WAVE LITHOTRIPSY Right   . KIDNEY STONE SURGERY  2009  . PROSTATE SURGERY  2011  . TRANSURETHRAL RESECTION OF PROSTATE N/A 12/06/2014   Procedure: TRANSURETHRAL RESECTION OF THE PROSTATE (TURP);  Surgeon: BNickie Retort MD;  Location: [facility] ORS;  Service: Urology;  Laterality: N/A;   Social History   Socioeconomic History  . Marital status: Married    Spouse name: Not on file  . Number of children: Not on file  . Years of education: Not on file  . Highest education level: Not on file  Occupational History  . Not on file  Social Needs  . Financial resource strain: Somewhat hard  . Food insecurity:    Worry: Sometimes true    Inability: Sometimes true  . Transportation needs:    Medical: Yes    Non-medical: Yes  Tobacco Use  . Smoking status: Former Smoker    Packs/day: 1.00    Years: 32.00    Pack years: 32.00    Types: Cigarettes    Last attempt to quit: 03/04/1991    Years since quitting: 26.6  . Smokeless tobacco: Former UNetwork engineerand Sexual Activity  . Alcohol use: Yes    Alcohol/week:  0.0 standard drinks    Comment: occasional  . Drug use: No  . Sexual activity: Not on file  Lifestyle  . Physical activity:    Days per week: 0 days    Minutes per session: 0 min  . Stress: Not at all  Relationships  . Social connections:    Talks on phone: Never    Gets together: Never    Attends religious service: 1 to 4 times per year    Active member of club or organization: No    Attends meetings of clubs or organizations: Never    Relationship status: Married  . Intimate partner violence:    Fear of current or ex partner: No    Emotionally abused: No    Physically abused: No    Forced sexual activity: No  Other Topics Concern  . Not on file  Social History Narrative   . Not on file   Family History  Family history unknown: Yes   Current Outpatient Medications on File Prior to Visit  Medication Sig  . ACCU-CHEK FASTCLIX LANCETS MISC check blood sugar up to 1 time daily as directed  . Alcohol Swabs (B-D SINGLE USE SWABS REGULAR) PADS check blood sugar up to 1 time daily as directed  . aspirin EC 81 MG tablet Take by mouth.  Marland Kitchen atorvastatin (LIPITOR) 20 MG tablet Take 1 tablet (20 mg total) by mouth daily.  . baclofen (LIORESAL) 10 MG tablet Take 0.5-1 tablets (5-10 mg total) by mouth 3 (three) times daily as needed for muscle spasms.  . benazepril-hydrochlorthiazide (LOTENSIN HCT) 10-12.5 MG tablet Take 1 tablet by mouth daily.  Marland Kitchen BESIVANCE 0.6 % SUSP Place 1 drop into the right eye 3 (three) times daily.  . bimatoprost (LUMIGAN) 0.01 % SOLN Place 1 drop into both eyes at bedtime.  . Blood Glucose Monitoring Suppl (ACCU-CHEK AVIVA PLUS) w/Device KIT Use device to check blood sugar up to 1 time daily as directed  . brimonidine (ALPHAGAN P) 0.1 % SOLN Apply to eye.  . brimonidine (ALPHAGAN) 0.2 % ophthalmic solution INT 1 GTT IN OU TID  . Calcium Citrate-Vitamin D 1000-400 LIQD   . dorzolamide (TRUSOPT) 2 % ophthalmic solution Apply to eye.  . dorzolamide-timolol (COSOPT) 22.3-6.8 MG/ML ophthalmic solution   . DUREZOL 0.05 % EMUL Place 1 drop into the right eye 3 (three) times daily.  Marland Kitchen EFFERVESCENT PAIN RELIEF [phone] MG TBEF tablet   . fluticasone (FLONASE) 50 MCG/ACT nasal spray Place 2 sprays into both nostrils daily.  Marland Kitchen gabapentin (NEURONTIN) 300 MG capsule Please follow titration schedule to reach goal dose 600 mg three times a day.  Marland Kitchen glipiZIDE (GLUCOTROL XL) 5 MG 24 hr tablet Take 1 tablet (5 mg total) by mouth daily with breakfast.  . glucose blood test strip check blood sugar up to 1 time daily as directed  . ketorolac (ACULAR) 0.4 % SOLN Apply to eye.  . levothyroxine (SYNTHROID, LEVOTHROID) 50 MCG tablet Take 1 tablet (50 mcg total) by  mouth daily before breakfast. 30 min before  . loratadine (CLARITIN) 10 MG tablet Take 1 tablet (10 mg total) by mouth daily.  . metoprolol tartrate (LOPRESSOR) 25 MG tablet Take 0.5 tablets (12.5 mg total) by mouth 2 (two) times daily.  . naproxen (NAPROSYN) 500 MG tablet Take 1 tablet (500 mg total) by mouth 2 (two) times daily with a meal. For 2 to 4 weeks  . nitazoxanide (ALINIA) 500 MG tablet Take by mouth.  Marland Kitchen omeprazole (PRILOSEC) 40  MG capsule TAKE 1 CAPSULE BY MOUTH DAILY BEFORE BREAKFAST 15 TO 30 MINUTES BEFORE FIRST MEAL OF THE DAY  . ondansetron (ZOFRAN ODT) 4 MG disintegrating tablet Take 1 tablet (4 mg total) every 8 (eight) hours as needed by mouth for nausea or vomiting.  . polyethylene glycol powder (GLYCOLAX/MIRALAX) powder MIX ONE CAPFUL IN LIQUID AND DRINK DAILY AS NEEDED.  Marland Kitchen PROLENSA 0.07 % SOLN Place 1 drop into the left eye at bedtime.  . sildenafil (REVATIO) 20 MG tablet Take 1-5 pills about 30 min prior to sex. Start with 1 and increase as needed.  . timolol (TIMOPTIC) 0.25 % ophthalmic solution INT 1 GTT IN OU QAM  . tiZANidine (ZANAFLEX) 2 MG tablet   . latanoprost (XALATAN) 0.005 % ophthalmic solution INT 1 GTT INTO OU QHS   No current facility-administered medications on file prior to visit.     Review of Systems  Constitutional: Negative for activity change, appetite change, chills, diaphoresis, fatigue and fever.  HENT: Negative for congestion and hearing loss.   Eyes: Negative for visual disturbance.  Respiratory: Negative for apnea, cough, choking, chest tightness, shortness of breath and wheezing.   Cardiovascular: Negative for chest pain, palpitations and leg swelling.  Gastrointestinal: Negative for abdominal pain, constipation, diarrhea, nausea and vomiting.  Endocrine: Negative for cold intolerance.  Genitourinary: Negative for difficulty urinating, dysuria, frequency and hematuria.  Musculoskeletal: Negative for arthralgias and neck pain.  Skin:  Negative for rash.  Allergic/Immunologic: Negative for environmental allergies.  Neurological: Negative for dizziness, weakness, light-headedness, numbness and headaches.  Hematological: Negative for adenopathy.  Psychiatric/Behavioral: Negative for behavioral problems, dysphoric mood and sleep disturbance.   Per HPI unless specifically indicated above     Objective:    BP 113/65   Pulse (!) 51   Temp 97.8 F (36.6 C) (Oral)   Resp 16   Ht '5\' 6"'$  (1.676 m)   Wt 172 lb (78 kg)   BMI 27.76 kg/m   Wt Readings from Last 3 Encounters:  10/26/17 172 lb (78 kg)  08/12/17 171 lb (77.6 kg)  07/24/17 169 lb (76.7 kg)    Physical Exam  Constitutional: He is oriented to person, place, and time. He appears well-developed and well-nourished. No distress.  Well-appearing, comfortable, cooperative  HENT:  Head: Normocephalic and atraumatic.  Mouth/Throat: Oropharynx is clear and moist.  Eyes: Pupils are equal, round, and reactive to light. Conjunctivae and EOM are normal. Right eye exhibits no discharge. Left eye exhibits no discharge.  Neck: Normal range of motion. Neck supple. No thyromegaly present.  Cardiovascular: Normal rate, regular rhythm, normal heart sounds and intact distal pulses.  No murmur heard. Pulmonary/Chest: Effort normal and breath sounds normal. No respiratory distress. He has no wheezes. He has no rales.  Abdominal: Soft. Bowel sounds are normal. He exhibits no distension and no mass. There is no tenderness.  Musculoskeletal: Normal range of motion. He exhibits no edema or tenderness.  Bilateral upper ext normal tone and strength and sensation Right lower extremity normal strength and sensation  S/p L TKR, some reduced ROM Left lower extremity hypotonia Ambulates without assistance of crutch or cane, has large lateral external shoe wedge custom on L shoe   Lymphadenopathy:    He has no cervical adenopathy.  Neurological: He is alert and oriented to person, place,  and time.  Distal sensation intact to light touch all extremities  Skin: Skin is warm and dry. No rash noted. He is not diaphoretic. No erythema.  Psychiatric: He  has a normal mood and affect. His behavior is normal.  Well groomed, good eye contact, normal speech and thoughts  Nursing note and vitals reviewed.    Diabetic Foot Exam - Simple   Simple Foot Form Diabetic Foot exam was performed with the following findings:  Yes 10/26/2017  2:23 PM  Visual Inspection See comments:  Yes Sensation Testing Intact to touch and monofilament testing bilaterally:  Yes Pulse Check Posterior Tibialis and Dorsalis pulse intact bilaterally:  Yes Comments Right foot monofilament intact sensation, no deformity, no skin changes, pulses intact. Left foot chronic deformity with focal dystonia and foot drop, reduced sensation at baseline with deformity, has shoe wedge custom shoe on today.     Results for orders placed or performed in visit on 10/20/17  T4, free  Result Value Ref Range   Free T4 1.2 0.8 - 1.8 ng/dL  TSH  Result Value Ref Range   TSH 2.88 0.40 - 4.50 mIU/L  PSA, Total with Reflex to PSA, Free  Result Value Ref Range   PSA, Total 1.5 < OR = 4.0 ng/mL  Lipid panel  Result Value Ref Range   Cholesterol 157 <200 mg/dL   HDL 62 >40 mg/dL   Triglycerides 70 <150 mg/dL   LDL Cholesterol (Calc) 80 mg/dL (calc)   Total CHOL/HDL Ratio 2.5 <5.0 (calc)   Non-HDL Cholesterol (Calc) 95 <130 mg/dL (calc)  COMPLETE METABOLIC PANEL WITH GFR  Result Value Ref Range   Glucose, Bld 99 65 - 99 mg/dL   BUN 15 7 - 25 mg/dL   Creat 1.07 0.70 - 1.25 mg/dL   GFR, Est Non African American 72 > OR = 60 mL/min/1.29m   GFR, Est African American 83 > OR = 60 mL/min/1.762m  BUN/Creatinine Ratio NOT APPLICABLE 6 - 22 (calc)   Sodium 145 135 - 146 mmol/L   Potassium 4.2 3.5 - 5.3 mmol/L   Chloride 102 98 - 110 mmol/L   CO2 28 20 - 32 mmol/L   Calcium 8.9 8.6 - 10.3 mg/dL   Total Protein 7.2 6.1 - 8.1  g/dL   Albumin 4.1 3.6 - 5.1 g/dL   Globulin 3.1 1.9 - 3.7 g/dL (calc)   AG Ratio 1.3 1.0 - 2.5 (calc)   Total Bilirubin 0.5 0.2 - 1.2 mg/dL   Alkaline phosphatase (APISO) 67 40 - 115 U/L   AST 15 10 - 35 U/L   ALT 20 9 - 46 U/L  CBC with Differential/Platelet  Result Value Ref Range   WBC 9.4 3.8 - 10.8 Thousand/uL   RBC 5.27 4.20 - 5.80 Million/uL   Hemoglobin 15.7 13.2 - 17.1 g/dL   HCT 46.8 38.5 - 50.0 %   MCV 88.8 80.0 - 100.0 fL   MCH 29.8 27.0 - 33.0 pg   MCHC 33.5 32.0 - 36.0 g/dL   RDW 12.9 11.0 - 15.0 %   Platelets 219 140 - 400 Thousand/uL   MPV 10.7 7.5 - 12.5 fL   Neutro Abs 3,892 1,500 - 7,800 cells/uL   Lymphs Abs 4,521 (H) 850 - 3,900 cells/uL   WBC mixed population 884 200 - 950 cells/uL   Eosinophils Absolute 47 15 - 500 cells/uL   Basophils Absolute 56 0 - 200 cells/uL   Neutrophils Relative % 41.4 %   Total Lymphocyte 48.1 %   Monocytes Relative 9.4 %   Eosinophils Relative 0.5 %   Basophils Relative 0.6 %  Hemoglobin A1c  Result Value Ref Range   Hgb A1c  MFr Bld 6.7 (H) <5.7 % of total Hgb   Mean Plasma Glucose 146 (calc)   eAG (mmol/L) 8.1 (calc)      Assessment & Plan:   Problem List Items Addressed This Visit    Acid reflux    Controlled on high dose treatment, dual therapy  Discontinue H2 Blocker and resume PPI Omeprazole '40mg'$  daily Follow-up if needed      Atypical intracranial meningioma (HCC)    Stable chronic problem asymptomatic Follow-up in future as needed with [facility]      Essential hypertension    Well-controlled HTN - Home BP readings normal  No known complications    Plan:  1. Continue current BP regimen - Metoprolol 12.'5mg'$  BID (Half of '25mg'$ )  2. Encourage improved lifestyle - low sodium diet, regular exercise 3. Continue monitor BP outside office, bring readings to next visit, if persistently >140/90 or new symptoms notify office sooner      Focal dystonia    Stable, chronic Left foot deformity - chronic  problem >14 years now. It is significantly affecting his gait and limiting his ambulation which appears to have affected degenerative wear on his Left knee, now s/p TKR L knee - Followed by Neurology / [facility] - he has diabetic shoes and orthotic inserts / AFO / L shoe lateral wedge  Completed DMV handicap placard renewal form today, returned to patient      Hyperlipidemia    Controlled cholesterol on statin and lifestyle Last lipid panel 10/2017  Plan: 1. Continue current meds - Atorvastatin '20mg'$  daily 2. Continue ASA '81mg'$  for primary ASCVD risk reduction 3. Encourage improved lifestyle - low carb/cholesterol, reduce portion size, continue improving regular exercise      Hypothyroidism    Asymptomatic, controlled Followed by [facility] Endocrinology now Last TSH normal Continue current dose Levothyroxine 8mg daily      Type 2 diabetes mellitus with diabetic cataract, without long-term current use of insulin (HCC)    Mild elevated A1c to 6.7 but still well controlled within goal < 7 No known hypoglycemia. Complications - DM cataracts removed, other including hyperlipidemia, GERD, hypothyroidism - increases risk of future cardiovascular complications   Plan:  1. Continue current therapy - Glipizide XL '5mg'$  daily 2. Encourage improved lifestyle - low carb, low sugar diet, reduce portion size, continue improving regular exercise 3. Check CBG , bring log to next visit for review 4. Continue ASA, Statin 5. DM Foot exam done today 6. Follow-up q 5-6 mo DM A1c        Other Visit Diagnoses    Annual physical exam    -  Primary    Updated Health Maintenance information Reviewed recent lab results with patient Encouraged improvement to lifestyle with diet and exercise   No orders of the defined types were placed in this encounter.  Follow up plan: Return in about 5 months (around 03/28/2018) for DM A1c, Hypothyroidism, R neck swelling.  ANobie Putnam DO [facility] 10/27/2017, 1:16 AM

## 2017-10-26 NOTE — Patient Instructions (Addendum)
Thank you for coming to the office today.  For stomach acid reflux (heartburn) - STOP taking Ranitidine (Zantac) - CONTINUE taking Omeprazole 40mg  daily before breakfast  Please schedule and return for a NURSE ONLY VISIT for VACCINE - Approximately around October / November 2019 - Need High Dose Flu Vaccine  Completed handicap form today  Monitor swelling on R side of neck, if worsening or new concerns can notify office otherwise may monitor this in future   Please schedule a Follow-up Appointment to: Return in about 5 months (around 03/28/2018) for DM A1c, Hypothyroidism, R neck swelling.  If you have any other questions or concerns, please feel free to call the office or send a message through [place]. You may also schedule an earlier appointment if necessary.  Additionally, you may be receiving a survey about your experience at our office within a few days to 1 week by e-mail or mail. We value your feedback.  [name], DO [place]

## 2017-10-27 ENCOUNTER — Encounter: Payer: Self-pay | Admitting: Family Medicine

## 2017-10-27 NOTE — Assessment & Plan Note (Signed)
Asymptomatic, controlled Followed by [facility] Endocrinology now Last TSH normal Continue current dose Levothyroxine 83mcg daily

## 2017-10-27 NOTE — Assessment & Plan Note (Signed)
Mild elevated A1c to 6.7 but still well controlled within goal < 7 No known hypoglycemia. Complications - DM cataracts removed, other including hyperlipidemia, GERD, hypothyroidism - increases risk of future cardiovascular complications   Plan:  1. Continue current therapy - Glipizide XL 5mg  daily 2. Encourage improved lifestyle - low carb, low sugar diet, reduce portion size, continue improving regular exercise 3. Check CBG , bring log to next visit for review 4. Continue ASA, Statin 5. DM Foot exam done today 6. Follow-up q 5-6 mo DM A1c

## 2017-10-27 NOTE — Assessment & Plan Note (Signed)
Controlled on high dose treatment, dual therapy  Discontinue H2 Blocker and resume PPI Omeprazole 40mg  daily Follow-up if needed

## 2017-10-27 NOTE — Assessment & Plan Note (Signed)
Controlled cholesterol on statin and lifestyle Last lipid panel 10/2017  Plan: 1. Continue current meds - Atorvastatin 20mg  daily 2. Continue ASA 81mg  for primary ASCVD risk reduction 3. Encourage improved lifestyle - low carb/cholesterol, reduce portion size, continue improving regular exercise

## 2017-10-27 NOTE — Assessment & Plan Note (Signed)
Stable, chronic Left foot deformity - chronic problem >14 years now. It is significantly affecting his gait and limiting his ambulation which appears to have affected degenerative wear on his Left knee, now s/p TKR L knee - Followed by Neurology / Ortho - he has diabetic shoes and orthotic inserts / AFO / L shoe lateral wedge  Completed DMV handicap placard renewal form today, returned to patient

## 2017-10-27 NOTE — Assessment & Plan Note (Signed)
Well-controlled HTN - Home BP readings normal  No known complications    Plan:  1. Continue current BP regimen - Metoprolol 12.5mg  BID (Half of 25mg )  2. Encourage improved lifestyle - low sodium diet, regular exercise 3. Continue monitor BP outside office, bring readings to next visit, if persistently >140/90 or new symptoms notify office sooner

## 2017-10-27 NOTE — Assessment & Plan Note (Signed)
Stable chronic problem asymptomatic Follow-up in future as needed with [facility]

## 2017-11-01 ENCOUNTER — Other Ambulatory Visit: Payer: Self-pay | Admitting: Family Medicine

## 2017-11-01 DIAGNOSIS — E039 Hypothyroidism, unspecified: Secondary | ICD-10-CM

## 2017-11-04 DIAGNOSIS — D329 Benign neoplasm of meninges, unspecified: Secondary | ICD-10-CM | POA: Diagnosis not present

## 2017-11-17 DIAGNOSIS — L720 Epidermal cyst: Secondary | ICD-10-CM | POA: Diagnosis not present

## 2017-11-17 DIAGNOSIS — L570 Actinic keratosis: Secondary | ICD-10-CM | POA: Diagnosis not present

## 2017-12-02 DIAGNOSIS — Z23 Encounter for immunization: Secondary | ICD-10-CM | POA: Diagnosis not present

## 2017-12-02 DIAGNOSIS — E039 Hypothyroidism, unspecified: Secondary | ICD-10-CM | POA: Diagnosis not present

## 2017-12-02 DIAGNOSIS — I10 Essential (primary) hypertension: Secondary | ICD-10-CM | POA: Diagnosis not present

## 2017-12-02 DIAGNOSIS — E119 Type 2 diabetes mellitus without complications: Secondary | ICD-10-CM | POA: Diagnosis not present

## 2017-12-15 DIAGNOSIS — H401132 Primary open-angle glaucoma, bilateral, moderate stage: Secondary | ICD-10-CM | POA: Diagnosis not present

## 2017-12-25 ENCOUNTER — Ambulatory Visit (INDEPENDENT_AMBULATORY_CARE_PROVIDER_SITE_OTHER): Payer: Medicare HMO | Admitting: Family Medicine

## 2017-12-25 ENCOUNTER — Encounter: Payer: Self-pay | Admitting: Family Medicine

## 2017-12-25 VITALS — BP 139/86 | HR 68 | Temp 98.6°F | Resp 16 | Ht 66.0 in | Wt 175.0 lb

## 2017-12-25 DIAGNOSIS — N529 Male erectile dysfunction, unspecified: Secondary | ICD-10-CM

## 2017-12-25 DIAGNOSIS — R6883 Chills (without fever): Secondary | ICD-10-CM

## 2017-12-25 NOTE — Progress Notes (Signed)
Subjective:    Patient ID: Ross Riley, male    DOB: 06-27-1950, 67 y.o.   MRN: [id number]  Ross Riley is a 67 y.o. male presenting on 12/25/2017 for Chills (as per patient Dr. Ramiro Harvest suggested to get lab work for Testosterone for chills )   HPI   Chills (without fever) Past history earlier this year with same concern of having episodes of chills, uncertain etiology, he was followed for this for a while, including had lab testing, treatment for C Diff in past, and has seen ID as well. Ultimately no clearly determined cause. Reportedly this symptom had improved after his thyroid was corrected. - Now it has returned and he complains of same problem with episodic chills still, without fever or sweats or other concerns. - He was advised by Endocinrology Jefm Bryant) that he should return here to discuss testosterone testing as a possible cause. - He is requesting testosterone test at this time - admits some fatigue/tired, erectile dysfunction - Denies any chest pain, dyspnea, erythema, rash, active chills, sweats, cough, dysuria  Erectile Dysfunction Known chronic ED, previously had limited results on treatment has been rx sildenafil without dramatic improvement.  Health Maintenance: Due for Flu Shot, will receive today    Depression screen [facility] 2/9 12/25/2017 10/26/2017 08/12/2017  Decreased Interest 0 0 0  Down, Depressed, Hopeless 0 0 0  PHQ - 2 Score 0 0 0  Altered sleeping - - 0  Tired, decreased energy - - 0  Change in appetite - - 0  Feeling bad or failure about yourself  - - 0  Trouble concentrating - - 0  Moving slowly or fidgety/restless - - 0  Suicidal thoughts - - 0  PHQ-9 Score - - 0  Difficult doing work/chores - - Not difficult at all    Social History   Tobacco Use  . Smoking status: Former Smoker    Packs/day: 1.00    Years: 32.00    Pack years: 32.00    Types: Cigarettes    Last attempt to quit: 03/04/1991    Years since quitting: 26.8  . Smokeless  tobacco: Former Network engineer Use Topics  . Alcohol use: Yes    Alcohol/week: 0.0 standard drinks    Comment: occasional  . Drug use: No    Review of Systems Per HPI unless specifically indicated above     Objective:    BP 139/86   Pulse 68   Temp 98.6 F (37 C)   Resp 16   Ht 5\' 6"  (1.676 m)   Wt 175 lb (79.4 kg)   BMI 28.25 kg/m   Wt Readings from Last 3 Encounters:  12/25/17 175 lb (79.4 kg)  10/26/17 172 lb (78 kg)  08/12/17 171 lb (77.6 kg)    Physical Exam  Constitutional: He is oriented to person, place, and time. He appears well-developed and well-nourished. No distress.  Well-appearing, comfortable, cooperative  HENT:  Head: Normocephalic and atraumatic.  Mouth/Throat: Oropharynx is clear and moist.  Eyes: Conjunctivae are normal. Right eye exhibits no discharge. Left eye exhibits no discharge.  Cardiovascular: Normal rate.  Pulmonary/Chest: Effort normal.  Musculoskeletal: He exhibits no edema.  Neurological: He is alert and oriented to person, place, and time.  Skin: Skin is warm and dry. No rash noted. He is not diaphoretic. No erythema.  Psychiatric: He has a normal mood and affect. His behavior is normal.  Well groomed, good eye contact, normal speech and thoughts  Nursing note and  vitals reviewed.  Results for orders placed or performed in visit on 10/20/17  T4, free  Result Value Ref Range   Free T4 1.2 0.8 - 1.8 ng/dL  TSH  Result Value Ref Range   TSH 2.88 0.40 - 4.50 mIU/L  PSA, Total with Reflex to PSA, Free  Result Value Ref Range   PSA, Total 1.5 < OR = 4.0 ng/mL  Lipid panel  Result Value Ref Range   Cholesterol 157 <200 mg/dL   HDL 62 >40 mg/dL   Triglycerides 70 <150 mg/dL   LDL Cholesterol (Calc) 80 mg/dL (calc)   Total CHOL/HDL Ratio 2.5 <5.0 (calc)   Non-HDL Cholesterol (Calc) 95 <130 mg/dL (calc)  COMPLETE METABOLIC PANEL WITH GFR  Result Value Ref Range   Glucose, Bld 99 65 - 99 mg/dL   BUN 15 7 - 25 mg/dL   Creat 1.07  0.70 - 1.25 mg/dL   GFR, Est Non African American 72 > OR = 60 mL/min/1.33m2   GFR, Est African American 83 > OR = 60 mL/min/1.68m2   BUN/Creatinine Ratio NOT APPLICABLE 6 - 22 (calc)   Sodium 145 135 - 146 mmol/L   Potassium 4.2 3.5 - 5.3 mmol/L   Chloride 102 98 - 110 mmol/L   CO2 28 20 - 32 mmol/L   Calcium 8.9 8.6 - 10.3 mg/dL   Total Protein 7.2 6.1 - 8.1 g/dL   Albumin 4.1 3.6 - 5.1 g/dL   Globulin 3.1 1.9 - 3.7 g/dL (calc)   AG Ratio 1.3 1.0 - 2.5 (calc)   Total Bilirubin 0.5 0.2 - 1.2 mg/dL   Alkaline phosphatase (APISO) 67 40 - 115 U/L   AST 15 10 - 35 U/L   ALT 20 9 - 46 U/L  CBC with Differential/Platelet  Result Value Ref Range   WBC 9.4 3.8 - 10.8 Thousand/uL   RBC 5.27 4.20 - 5.80 Million/uL   Hemoglobin 15.7 13.2 - 17.1 g/dL   HCT 46.8 38.5 - 50.0 %   MCV 88.8 80.0 - 100.0 fL   MCH 29.8 27.0 - 33.0 pg   MCHC 33.5 32.0 - 36.0 g/dL   RDW 12.9 11.0 - 15.0 %   Platelets 219 140 - 400 Thousand/uL   MPV 10.7 7.5 - 12.5 fL   Neutro Abs 3,892 1,500 - 7,800 cells/uL   Lymphs Abs 4,521 (H) 850 - 3,900 cells/uL   WBC mixed population 884 200 - 950 cells/uL   Eosinophils Absolute 47 15 - 500 cells/uL   Basophils Absolute 56 0 - 200 cells/uL   Neutrophils Relative % 41.4 %   Total Lymphocyte 48.1 %   Monocytes Relative 9.4 %   Eosinophils Relative 0.5 %   Basophils Relative 0.6 %  Hemoglobin A1c  Result Value Ref Range   Hgb A1c MFr Bld 6.7 (H) <5.7 % of total Hgb   Mean Plasma Glucose 146 (calc)   eAG (mmol/L) 8.1 (calc)      Assessment & Plan:   Problem List Items Addressed This Visit    Chills (without fever) - Primary   Relevant Orders   Testosterone Total,Free,Bio, Males   ED (erectile dysfunction) of organic origin   Relevant Orders   Testosterone Total,Free,Bio, Males      Remains uncertain etiology now for recurrence of same chills episodic w/o fever or sign of infection Previously thought to have resolved w/ thyroid, now seems not the case No sign  of infection or other clue Question if possible low testosterone considered by  Endocrinology Also has ED symptoms despite PDE  Plan Check testosterone panel - return on Monday 10/28 fasting in AM for accurate result - if significant, will repeat to confirm - then consider return to Endo to treat or Urology. If negative testing, will follow-up clinically and consider other options  No orders of the defined types were placed in this encounter.   Follow up plan: Return in about 1 week (around 01/01/2018) for lab only - keep apt in 03/2018.  Future labs ordered for 12/28/17 - Testosterone AM lab  [name], [facility] 12/25/2017, 11:20 AM

## 2017-12-25 NOTE — Patient Instructions (Addendum)
Thank you for coming to the office today.  Will check Testosterone, has to be 8 to 9 am in morning, to get an accurate result.  If low then we will re-check again before or at next apt  We may seek help from Endocrinology or a Urology if level is low  [facility] [phone] / Fax [phone]  Call them to ask to see if you can re order the diabetic shoes if they are covered, have them send [place] a fax.  Or we can re order in 03/2018 at next diabetic visit   DUE for FASTING BLOOD WORK (no food or drink after midnight before the lab appointment, only water or coffee without cream/sugar on the morning of)  SCHEDULE "Lab Only" visit in the morning at the clinic for lab draw in 1 WEEK  - Make sure Lab Only appointment is at about 1 week before your next appointment, so that results will be available  For Lab Results, once available within 2-3 days of blood draw, you can can log in to MyChart online to view your results and a brief explanation. Also, we can discuss results at next follow-up visit.    Please schedule a Follow-up Appointment to: Return in about 1 week (around 01/01/2018) for lab only - keep apt in 03/2018.  If you have any other questions or concerns, please feel free to call the office or send a message through [place]. You may also schedule an earlier appointment if necessary.  Additionally, you may be receiving a survey about your experience at our office within a few days to 1 week by e-mail or mail. We value your feedback.  [name], DO [place]

## 2017-12-26 ENCOUNTER — Encounter: Payer: Self-pay | Admitting: Family Medicine

## 2017-12-29 ENCOUNTER — Other Ambulatory Visit: Payer: Medicare HMO

## 2017-12-29 DIAGNOSIS — N529 Male erectile dysfunction, unspecified: Secondary | ICD-10-CM | POA: Diagnosis not present

## 2017-12-29 DIAGNOSIS — R6883 Chills (without fever): Secondary | ICD-10-CM | POA: Diagnosis not present

## 2017-12-30 LAB — TESTOSTERONE TOTAL,FREE,BIO, MALES
Albumin: 4.1 g/dL (ref 3.6–5.1)
Sex Hormone Binding: 30 nmol/L (ref 22–77)
Testosterone, Bioavailable: 113 ng/dL (ref >=110.0)
Testosterone, Free: 60 pg/mL (ref 46.0–224.0)
Testosterone: 411 ng/dL (ref 250–827)

## 2018-01-12 ENCOUNTER — Other Ambulatory Visit: Payer: Self-pay | Admitting: Family Medicine

## 2018-01-12 DIAGNOSIS — E785 Hyperlipidemia, unspecified: Secondary | ICD-10-CM

## 2018-03-08 DIAGNOSIS — E039 Hypothyroidism, unspecified: Secondary | ICD-10-CM | POA: Diagnosis not present

## 2018-03-08 DIAGNOSIS — I10 Essential (primary) hypertension: Secondary | ICD-10-CM | POA: Diagnosis not present

## 2018-03-08 DIAGNOSIS — E119 Type 2 diabetes mellitus without complications: Secondary | ICD-10-CM | POA: Diagnosis not present

## 2018-03-29 ENCOUNTER — Ambulatory Visit (INDEPENDENT_AMBULATORY_CARE_PROVIDER_SITE_OTHER): Payer: Medicare Other | Admitting: Family Medicine

## 2018-03-29 ENCOUNTER — Encounter: Payer: Self-pay | Admitting: Family Medicine

## 2018-03-29 VITALS — BP 177/93 | HR 61 | Temp 98.4°F | Resp 16 | Ht 66.0 in | Wt 175.0 lb

## 2018-03-29 DIAGNOSIS — R221 Localized swelling, mass and lump, neck: Secondary | ICD-10-CM

## 2018-03-29 DIAGNOSIS — D420 Neoplasm of uncertain behavior of cerebral meninges: Secondary | ICD-10-CM | POA: Diagnosis not present

## 2018-03-29 DIAGNOSIS — R110 Nausea: Secondary | ICD-10-CM | POA: Diagnosis not present

## 2018-03-29 DIAGNOSIS — H8113 Benign paroxysmal vertigo, bilateral: Secondary | ICD-10-CM | POA: Diagnosis not present

## 2018-03-29 MED ORDER — ONDANSETRON 4 MG PO TBDP: 4.0000 mg | ORAL_TABLET | Freq: Three times a day (TID) | ORAL | 1 refills | Status: DC | PRN

## 2018-03-29 MED ORDER — MECLIZINE HCL 25 MG PO TABS: 25.0000 mg | ORAL_TABLET | Freq: Three times a day (TID) | ORAL | 2 refills | Status: DC | PRN

## 2018-03-29 NOTE — Patient Instructions (Addendum)
Thank you for coming to the office today.  1. You have symptoms of Vertigo (Benign Paroxysmal Positional Vertigo) - This is commonly caused by inner ear fluid imbalance, sometimes can be worsened by allergies and sinus symptoms, otherwise it can occur randomly sometimes and we may never discover the exact cause. - To treat this, try the Epley Manuever (see diagrams/instructions below) at home up to 3 times a day for 1-2 weeks or until symptoms resolve - You may take Meclizine as needed up to 3 times a day for dizziness, this will not cure symptoms but may help. Caution may make you drowsy.  If you develop significant worsening episode with vertigo that does not improve and you get severe headache, loss of vision, arm or leg weakness, slurred speech, or other concerning symptoms please seek immediate medical attention at Emergency Department.  Please schedule a follow-up appointment with Dr Parks Ranger within 2-4 weeks if Vertigo not improving, and will consider Referral to Vestibular Rehab  See the next page for images describing the Epley Manuever.     ----------------------------------------------------------------------------------------------------------------------        Ask about Diabetic Shoes - we can do this next time and will complete order form.  [address] [place], [place] [address] Open until [place] Phone: [phone]  Please schedule a Follow-up Appointment to: Return in about 4 weeks (around 04/26/2018) for re-schedule DM A1c.  If you have any other questions or concerns, please feel free to call the office or send a message through [place]. You may also schedule an earlier appointment if necessary.  Additionally, you may be receiving a survey about your experience at our office within a few days to 1 week by e-mail or mail. We value your feedback.  [name], DO [facility], [facility]  Vrtigo Vertigo El  vrtigo es la sensacin de que usted o todo lo que lo rodea se mueve cuando en realidad eso no sucede. El vrtigo puede ser peligroso si ocurre mientras est haciendo algo que podra suponer un riesgo para usted y para los dems, por ejemplo, conduciendo un automvil. Cules son las causas? Esta afeccin es causada por una alteracin en las seales que su sistema sensorial enva al cerebro. Existen diferentes factores que pueden causar esta alteracin, por ejemplo:  Infecciones, especialmente en el odo interno.  Una reaccin adversa a un medicamento o el uso indebido de alcohol y medicamentos.  Abstinencia de drogas o alcohol.  Cambios rpidos de posicin, como al [employer] o darse vuelta en la cama.  Cefaleas migraosas.  Una disminucin del flujo sanguneo hacia el cerebro.  Una disminucin de la presin arterial.  Un aumento de la presin en el cerebro por un traumatismo en la cabeza o el cuello, accidente cerebrovascular, infeccin, tumor o sangrado.  Trastornos del sistema nervioso central. Cules son los signos o los sntomas? Generalmente, los sntomas de este trastorno se presentan al mover la cabeza o los ojos en diferentes direcciones. Los sntomas pueden aparecer repentinamente y suelen durar menos de un minuto. Entre los sntomas se pueden incluir los siguientes:  Prdida del equilibrio y cadas.  Sensacin de estar dando vueltas o movindose.  Sensacin de que el entorno est dando vueltas o movindose.  Nuseas y vmitos.  Visin doble o borrosa.  Dificultad para escuchar.  Hablar arrastrando las palabras.  Mareos.  Movimientos oculares involuntarios (nistagmo). Los sntomas pueden ser leves y un poco molestos, o pueden ser graves e interferir con la vida cotidiana.  Los episodios de vrtigo pueden repetirse (ser recurrentes) a lo largo del tiempo y algunos movimientos pueden desencadenarlos. Los sntomas pueden mejorar con Physiological scientist. Cmo se  diagnostica? Esta afeccin puede diagnosticarse en funcin de la historia clnica y la calidad del nistagmo. Su mdico podr examinar el movimiento de sus ojos indicndole que los cambie de direccin rpidamente para provocar el nistagmo. Esto se llama tambin prueba de Dix-Hallpike, prueba de impulso ceflico o prueba de rotacin. Tal vez lo deriven a un mdico especialista en odo, nariz y garganta Warehouse manager) o a uno que se especializa en trastornos del sistema nervioso central (neurlogo). Pueden hacerle otros estudios, entre ellos:  Un examen fsico.  Anlisis de sangre.  Resonancia magntica (RM).  Una exploracin por tomografa computarizada (TC).  Un electrocardiograma (ECG). Este estudio registra la actividad elctrica del corazn.  Un electroencefalograma (EEG). Este estudio registra la actividad elctrica del cerebro.  Pruebas de audicin. Cmo se trata? El tratamiento de esta afeccin depende de la causa y la gravedad de los sntomas. Las opciones de tratamiento incluyen:  Medicamentos para tratar las [place]. Se utilizan generalmente para los casos graves. Algunos medicamentos que se utilizan para tratar otras afecciones tambin podran reducir o eliminar los sntomas del vrtigo. Estos incluyen los siguientes: ? Medicamentos para Runner, broadcasting/film/video (antihistamnicos). ? Medicamentos para controlar las convulsiones (anticonvulsivos). ? Medicamentos para Human resources officer depresin (antidepresivos). ? Medicamentos para Human resources officer ansiedad (sedantes).  Movimientos de cabeza para acomodar el odo interno otra vez a la normalidad. Si el vrtigo es causado por un problema en el odo, su mdico podra recomendarle que haga ciertos movimientos para corregir el problema.  Ciruga. Esto es raro. Siga estas indicaciones en su casa: Seguridad  Muvase despacio.No haga movimientos bruscos con el cuerpo o con la cabeza.  No conduzca.  No opere maquinaria  pesada.  No haga ninguna tarea que podra ser peligrosa para usted o para Producer, television/film/video en caso de que ocurriera un episodio de vrtigo.  Si tiene dificultad para caminar o mantener el equilibrio, use un bastn para Consulting civil engineer estabilidad. Si se siente mareado o inestable, sintese de inmediato.  Retome sus actividades normales como se lo haya indicado el mdico. Pregntele al mdico qu actividades son seguras para usted. Instrucciones generales  [employer]phi de venta libre y los recetados solamente como se lo haya indicado el mdico.  Evite algunas posiciones o determinados movimientos como se lo haya indicado el mdico.  Beba suficiente lquido para Theatre manager la orina clara o de color amarillo plido.  Concurra a todas las visitas de seguimiento como se lo haya indicado el mdico. Esto es importante. Comunquese con un mdico si:  Los medicamentos no le alivian el vrtigo o este [place].  Tiene fiebre.  Su afeccin empeora o presenta sntomas nuevos.  Sus familiares o amigos advierten cambios en su comportamiento.  Las nuseas o los vmitos empeoran.  Tiene sensacin de adormecimiento o de "hormigueo" en una parte del cuerpo. Solicite ayuda de inmediato si:  Tiene dificultad para hablar o para moverse.  Esta mareado todo [employer].  Se desmaya.  Tiene dolores de cabeza intensos.  Tiene debilidad [employer], los brazos o las piernas.  Presenta cambios en la audicin o la visin.  Siente rigidez en el cuello.  Tiene sensibilidad a Naval architect. Esta informacin no tiene Marine scientist el consejo del mdico. Asegrese de hacerle al mdico cualquier pregunta que tenga. Document Released: 11/27/2004 Document Revised: 05/27/2016 Document  Reviewed: 06/12/2014 Elsevier Interactive Patient Education  [employer].

## 2018-03-29 NOTE — Assessment & Plan Note (Signed)
Suspected bilateral acute BPPV by history supported by reproduced symptoms on exam and prior significant history, previous symptoms 2018 w/ known atypical meningioma - however has been stable on last imaging MRI [facility] 11/2017 - No other significant neurological findings or focal deficits  Plan: 1. Handout given with Epley maneuver TID for 1-2 weeks until resolved 2. Re order new meclizine PRN for breakthrough symptoms - Rx Zofran ODT refill 3. Return criteria, if not improved consider vestibular PT referral

## 2018-03-29 NOTE — Progress Notes (Signed)
Subjective:    Patient ID: Ross Riley, male    DOB: 08/24/1950, 68 y.o.   MRN: [id number]  Ross Riley is a 68 y.o. male presenting on 03/29/2018 for Dizziness (onset today 3 or 4 am when turn around while sleeping got Dizzy and nauseous, HA, exhausted denies fever, chills or bodyache Meclizine taken but not improving dizziness)  Previously scheduled today for DM A1c follow-up, instead he has acute symptom of dizziness today, and request sick visit for Vertigo.  HPI  VERTIGO / DIZZINESS - Prior history of meningioma,  Last time he had similar symptoms related to this in 2018, was seen and treated at [facility] in [place] at that time, see report, Neurosurgery. He has done surveillance for this and last done 11/2017 by Dr Deetta Perla Neurosurgery, had yearly MRI negative, stable results without change, they recommended repeat yearly MRI still for now. He had previous Vertigo at that time. In past he was given Meclizine.  Today he reports that he did take an old dose of meclizine limited benefit, he only has 1 pill remaining. He describes spinning or dizzy with movement and head position, both sides. - Admits nausea, without vomiting - Denies sinus pain, headache, ear pain or pressure, fever or chills  Neck Swelling vs Mass Reports persistent issue R > L with localized neck swelling and possible mass or "bump" he has reported this recently in past >1-2 months, he has discussed it with his endocrinologist, they recommended return here and for imaging. - He has prior CT imaging on file dating back to CT Neck and Angio in 2018 that was negative for LAD or mass  Update - followed by [facility] for DM and Hypothyroidism, last seen 03/08/2017, reviewed note, they have continued him on current levothyroxine dose. He will return to discuss DM A1c here and is in need of diabetic shoes update  Depression screen [facility] 2/9 03/29/2018 12/25/2017 10/26/2017  Decreased Interest 0 0 0  Down, Depressed,  Hopeless 0 0 0  PHQ - 2 Score 0 0 0  Altered sleeping - - -  Tired, decreased energy - - -  Change in appetite - - -  Feeling bad or failure about yourself  - - -  Trouble concentrating - - -  Moving slowly or fidgety/restless - - -  Suicidal thoughts - - -  PHQ-9 Score - - -  Difficult doing work/chores - - -    Social History   Tobacco Use  . Smoking status: Former Smoker    Packs/day: 1.00    Years: 32.00    Pack years: 32.00    Types: Cigarettes    Last attempt to quit: 03/04/1991    Years since quitting: 27.0  . Smokeless tobacco: Former Network engineer Use Topics  . Alcohol use: Yes    Alcohol/week: 0.0 standard drinks    Comment: occasional  . Drug use: No    Review of Systems Per HPI unless specifically indicated above     Objective:    BP (!) 177/93   Pulse 61   Temp 98.4 F (36.9 C) (Oral)   Resp 16   Ht 5\' 6"  (1.676 m)   Wt 175 lb (79.4 kg)   SpO2 99%   BMI 28.25 kg/m   Wt Readings from Last 3 Encounters:  03/29/18 175 lb (79.4 kg)  12/25/17 175 lb (79.4 kg)  10/26/17 172 lb (78 kg)    Physical Exam Vitals signs and nursing note reviewed.  Constitutional:      General: He is not in acute distress.    Appearance: He is well-developed. He is not diaphoretic.     Comments: Currently uncomfortable with dizziness, laying down, cooperative  HENT:     Head: Normocephalic and atraumatic.     Comments: Bilateral TM clear, without erythema, effusion, or abnormality  No sinus congestion  Unable to tolerate dix-hallpike maneuver, reproduced dizziness on regular position change and head motion. Eyes:     General:        Right eye: No discharge.        Left eye: No discharge.     Conjunctiva/sclera: Conjunctivae normal.  Neck:     Musculoskeletal: Normal range of motion and neck supple. No muscular tenderness.     Thyroid: No thyromegaly.     Comments: Mild fullness or soft tissue edema, non specific supraclavicular R>L, unable to appreciate  definitive palpable mass or LAD, previously have appreciated some abnormality in this area on prior exam. Cardiovascular:     Rate and Rhythm: Normal rate and regular rhythm.     Heart sounds: Normal heart sounds. No murmur.  Pulmonary:     Effort: Pulmonary effort is normal. No respiratory distress.     Breath sounds: Normal breath sounds. No wheezing or rales.  Musculoskeletal: Normal range of motion.  Lymphadenopathy:     Cervical: No cervical adenopathy.  Skin:    General: Skin is warm and dry.     Findings: No erythema or rash.  Neurological:     Mental Status: He is alert and oriented to person, place, and time.  Psychiatric:        Behavior: Behavior normal.     Comments: Well groomed, good eye contact, normal speech and thoughts    Results for orders placed or performed in visit on 12/25/17  Testosterone Total,Free,Bio, Males  Result Value Ref Range   Testosterone 411 250 - 827 ng/dL   Albumin 4.1 3.6 - 5.1 g/dL   Sex Hormone Binding 30 22 - 77 nmol/L   Testosterone, Free 60.0 46.0 - 224.0 pg/mL   Testosterone, Bioavailable 113.0 110.0 - 575 ng/dL      Assessment & Plan:   Problem List Items Addressed This Visit    Atypical intracranial meningioma ([place])    Clinically had been stable and asymptomatic. Questionable if dizziness / vertigo is related. - Last check up by [facility] Neurosurgery Dr Lacinda Axon in 11/2017, he is on yearly surveillance w/ MRI, last visit was normal without change on MRI  He was advised to contact Neurosurgery if his dizziness / vertigo do not significantly improve with treatments      BPPV (benign paroxysmal positional vertigo), bilateral - Primary    Suspected bilateral acute BPPV by history supported by reproduced symptoms on exam and prior significant history, previous symptoms 2018 w/ known atypical meningioma - however has been stable on last imaging MRI [facility] 11/2017 - No other significant neurological findings or focal  deficits  Plan: 1. Handout given with Epley maneuver TID for 1-2 weeks until resolved 2. Re order new meclizine PRN for breakthrough symptoms - Rx Zofran ODT refill 3. Return criteria, if not improved consider vestibular PT referral      Relevant Medications   meclizine (ANTIVERT) 25 MG tablet    Other Visit Diagnoses    Nausea       Relevant Medications   ondansetron (ZOFRAN ODT) 4 MG disintegrating tablet   Localized swelling, mass or lump of  neck       Relevant Orders   CT SOFT TISSUE NECK W CONTRAST     Clinically with persistent recurrent symptoms of L>R neck supraclavicular swelling and possible nodular mass vs density, with some fluctuation - Question if related to prior episodic chills that were persistent  Plan Agree to pursue imaging, as requested by patient and also Endocrinology - Order placed for CT Neck Soft Tissue w Contrast - to be [facility]  Meds ordered this encounter  Medications  . ondansetron (ZOFRAN ODT) 4 MG disintegrating tablet    Sig: Take 1 tablet (4 mg total) by mouth every 8 (eight) hours as needed for nausea or vomiting.    Dispense:  30 tablet    Refill:  1  . meclizine (ANTIVERT) 25 MG tablet    Sig: Take 1 tablet (25 mg total) by mouth 3 (three) times daily as needed for dizziness.    Dispense:  30 tablet    Refill:  2   Orders Placed This Encounter  Procedures  . CT SOFT TISSUE NECK W CONTRAST    Standing Status:   Future    Standing Expiration Date:   06/28/2019    Order Specific Question:   ** REASON FOR EXAM (FREE TEXT)    Answer:   localized supraclavicular swelling R>L, bilateral, possible mass vs lymphadenopathy    Order Specific Question:   If indicated for the ordered procedure, I authorize the administration of contrast media per Radiology protocol    Answer:   Yes    Order Specific Question:   Preferred imaging location?    Answer:   [facility]    Order Specific Question:   Radiology Contrast Protocol - do NOT  remove file path    Answer:   [email or web address]    Follow up plan: Return in about 4 weeks (around 04/26/2018) for re-schedule DM A1c.  Return in 4-6 weeks for diabetic visit for A1c and order DM shoes.  [name], [facility] 03/29/2018, 1:41 PM

## 2018-03-29 NOTE — Assessment & Plan Note (Signed)
Clinically had been stable and asymptomatic. Questionable if dizziness / vertigo is related. - Last check up by [facility] Neurosurgery Dr Lacinda Axon in 11/2017, he is on yearly surveillance w/ MRI, last visit was normal without change on MRI  He was advised to contact Neurosurgery if his dizziness / vertigo do not significantly improve with treatments

## 2018-03-30 ENCOUNTER — Other Ambulatory Visit: Payer: Self-pay | Admitting: Family Medicine

## 2018-03-30 DIAGNOSIS — Z01812 Encounter for preprocedural laboratory examination: Secondary | ICD-10-CM

## 2018-03-30 DIAGNOSIS — E1136 Type 2 diabetes mellitus with diabetic cataract: Secondary | ICD-10-CM

## 2018-04-05 ENCOUNTER — Ambulatory Visit
Admission: RE | Admit: 2018-04-05 | Discharge: 2018-04-05 | Disposition: A | Discharge status: Home / Self Care | Payer: Medicare Other | Source: Ambulatory Visit | Attending: Family Medicine | Admitting: Family Medicine

## 2018-04-05 DIAGNOSIS — R221 Localized swelling, mass and lump, neck: Secondary | ICD-10-CM | POA: Diagnosis not present

## 2018-04-05 LAB — POCT I-STAT CREATININE: CREATININE: 0.9 mg/dL (ref 0.61–1.24)

## 2018-04-05 MED ORDER — IOPAMIDOL (ISOVUE-300) INJECTION 61%
75.0000 mL | Freq: Once | INTRAVENOUS | Status: AC | PRN
  Administered 2018-04-05: 75 mL via INTRAVENOUS

## 2018-04-13 ENCOUNTER — Ambulatory Visit: Payer: Self-pay

## 2018-04-13 ENCOUNTER — Ambulatory Visit (INDEPENDENT_AMBULATORY_CARE_PROVIDER_SITE_OTHER): Payer: Medicare Other

## 2018-04-13 VITALS — BP 138/80 | HR 84 | Temp 98.4°F | Resp 16 | Ht 66.0 in | Wt 173.4 lb

## 2018-04-13 DIAGNOSIS — Z0000 Encounter for general adult medical examination without abnormal findings: Secondary | ICD-10-CM

## 2018-04-13 DIAGNOSIS — Z23 Encounter for immunization: Secondary | ICD-10-CM

## 2018-04-13 NOTE — Progress Notes (Signed)
.   Subjective:   Ross Riley is a 68 y.o. male who presents for Medicare Annual/Subsequent preventive examination.  Review of Systems:  Cardiac Risk Factors include: advanced age (>76mn, >>79women);hypertension;diabetes mellitus;dyslipidemia;male gender;smoking/ tobacco exposure     Objective:    Vitals: BP 138/80 (BP Location: Left Arm, Patient Position: Sitting, Cuff Size: Normal)   Pulse 84   Temp 98.4 F (36.9 C) (Oral)   Resp 16   Ht _0  (1.676 m)   Wt 173 lb 6.4 oz (78.7 kg)   BMI 27.99 kg/m   Body mass index is 27.99 kg/m.  Advanced Directives 04/13/2018 04/07/2017 05/20/2016 12/06/2014 11/27/2014  Does Patient Have a Medical Advance Directive? _1   Would patient like information on creating a medical advance directive? Yes (MAU/Ambulatory/Procedural Areas - Information given) No - Patient declined No - Patient declined No - patient declined information Yes - Educational materials given    Tobacco Social History   Tobacco Use  Smoking Status Former Smoker  . Packs/day: 1.00  . Years: 32.00  . Pack years: 32.00  . Types: Cigarettes  . Last attempt to quit: 03/04/1991  . Years since quitting: 27.1  Smokeless Tobacco Never Used     Counseling given: Not Answered   Clinical Intake:  Pre-visit preparation completed: Yes  Pain : No/denies pain     Nutritional Status: BMI 25 -29 Overweight Nutritional Risks: None Diabetes: No  How often do you need to have someone help you when you read instructions, pamphlets, or other written materials from your doctor or pharmacy?: 1 - Never What is the last grade level you completed in school?: 9th grade  Interpreter Needed?: Yes Interpreter Agency: [place] Interpreter Name: OGrace BushyInterpreter ID: [id number] Declined Interpreter : No Patient signed [place] waiver: Yes  Information entered by ::  ,LPN   Past Medical History:  Diagnosis Date  . Arthritis   . BPH (benign  prostatic hyperplasia)   . Chronic kidney disease    kidney stones  . [place] complication [place]   . Dystonia   . ED (erectile dysfunction)   . GERD (gastroesophageal reflux disease)   . Glaucoma (increased eye pressure)   . Hematuria   . Hyperlipidemia   . Hypertension    Past Surgical History:  Procedure Laterality Date  . EXTRACORPOREAL SHOCK WAVE LITHOTRIPSY Right   . KIDNEY STONE SURGERY  2009  . PROSTATE SURGERY  2011  . TRANSURETHRAL RESECTION OF PROSTATE N/A 12/06/2014   Procedure: TRANSURETHRAL RESECTION OF THE PROSTATE (TURP);  Surgeon: BNickie Retort MD;  Location: [facility] ORS;  Service: Urology;  Laterality: N/A;   Family History  Family history unknown: Yes   Social History   Socioeconomic History  . Marital status: Married    Spouse name: Not on file  . Number of children: Not on file  . Years of education: Not on file  . Highest education level: 9th grade  Occupational History  . Occupation: retired  SScientific laboratory technician . Financial resource strain: Somewhat hard  . Food insecurity:    Worry: Sometimes true    Inability: Sometimes true  . Transportation needs:    Medical: Yes    Non-medical: Yes  Tobacco Use  . Smoking status: Former Smoker    Packs/day: 1.00    Years: 32.00    Pack years: 32.00    Types: Cigarettes    Last attempt to quit: 03/04/1991    Years  since quitting: 27.1  . Smokeless tobacco: Never Used  Substance and Sexual Activity  . Alcohol use: Yes    Alcohol/week: 0.0 standard drinks    Comment: occasional  . Drug use: No  . Sexual activity: Not on file  Lifestyle  . Physical activity:    Days per week: 0 days    Minutes per session: 0 min  . Stress: Not at all  Relationships  . Social connections:    Talks on phone: Never    Gets together: Never    Attends religious service: 1 to 4 times per year    Active member of club or organization: No    Attends meetings of clubs or organizations: Never    Relationship  status: Married  Other Topics Concern  . Not on file  Social History Narrative  . Not on file    Outpatient Encounter Medications as of 04/13/2018  Medication Sig  . ACCU-CHEK FASTCLIX LANCETS MISC check blood sugar up to 1 time daily as directed  . Alcohol Swabs (B-D SINGLE USE SWABS REGULAR) PADS check blood sugar up to 1 time daily as directed  . aspirin EC 81 MG tablet Take by mouth.  Marland Kitchen atorvastatin (LIPITOR) 20 MG tablet TAKE 1 TABLET(20 MG) BY MOUTH DAILY  . benazepril-hydrochlorthiazide (LOTENSIN HCT) 10-12.5 MG tablet Take 1 tablet by mouth daily.  . Blood Glucose Monitoring Suppl (ACCU-CHEK AVIVA PLUS) w/Device KIT Use device to check blood sugar up to 1 time daily as directed  . dorzolamide (TRUSOPT) 2 % ophthalmic solution Apply to eye.  Marland Kitchen glipiZIDE (GLUCOTROL XL) 5 MG 24 hr tablet Take 1 tablet (5 mg total) by mouth daily with breakfast.  . latanoprost (XALATAN) 0.005 % ophthalmic solution INT 1 GTT INTO OU QHS  . levothyroxine (SYNTHROID, LEVOTHROID) 50 MCG tablet TAKE 1 TABLET BY MOUTH EVERY DAY 30 MINUTES BEFORE BREAKFAST  . metoprolol tartrate (LOPRESSOR) 25 MG tablet Take 0.5 tablets (12.5 mg total) by mouth 2 (two) times daily.  . baclofen (LIORESAL) 10 MG tablet Take 0.5-1 tablets (5-10 mg total) by mouth 3 (three) times daily as needed for muscle spasms. (Patient not taking: Reported on 04/13/2018)  . gabapentin (NEURONTIN) 300 MG capsule Please follow titration schedule to reach goal dose 600 mg three times a day.  Marland Kitchen glucose blood test strip check blood sugar up to 1 time daily as directed (Patient not taking: Reported on 04/13/2018)  . nitazoxanide (ALINIA) 500 MG tablet Take by mouth.  . sildenafil (REVATIO) 20 MG tablet Take 1-5 pills about 30 min prior to sex. Start with 1 and increase as needed. (Patient not taking: Reported on 04/13/2018)  . tiZANidine (ZANAFLEX) 2 MG tablet   . [DISCONTINUED] BESIVANCE 0.6 % SUSP Place 1 drop into the right eye 3 (three) times daily.   . [DISCONTINUED] bimatoprost (LUMIGAN) 0.01 % SOLN Place 1 drop into both eyes at bedtime.  . [DISCONTINUED] brimonidine (ALPHAGAN P) 0.1 % SOLN Apply to eye.  . [DISCONTINUED] brimonidine (ALPHAGAN) 0.2 % ophthalmic solution INT 1 GTT IN OU TID  . [DISCONTINUED] Calcium Citrate-Vitamin D 1000-400 LIQD   . [DISCONTINUED] dorzolamide-timolol (COSOPT) 22.3-6.8 MG/ML ophthalmic solution   . [DISCONTINUED] DUREZOL 0.05 % EMUL Place 1 drop into the right eye 3 (three) times daily.  . [DISCONTINUED] EFFERVESCENT PAIN RELIEF [phone] MG TBEF tablet   . [DISCONTINUED] fluticasone (FLONASE) 50 MCG/ACT nasal spray Place 2 sprays into both nostrils daily.  . [DISCONTINUED] ketorolac (ACULAR) 0.4 % SOLN Apply to eye.  . [  DISCONTINUED] loratadine (CLARITIN) 10 MG tablet Take 1 tablet (10 mg total) by mouth daily. (Patient not taking: Reported on 04/13/2018)  . [DISCONTINUED] meclizine (ANTIVERT) 25 MG tablet Take 1 tablet (25 mg total) by mouth 3 (three) times daily as needed for dizziness. (Patient not taking: Reported on 04/13/2018)  . [DISCONTINUED] naproxen (NAPROSYN) 500 MG tablet Take 1 tablet (500 mg total) by mouth 2 (two) times daily with a meal. For 2 to 4 weeks (Patient not taking: Reported on 04/13/2018)  . [DISCONTINUED] omeprazole (PRILOSEC) 40 MG capsule TAKE 1 CAPSULE BY MOUTH DAILY BEFORE BREAKFAST 15 TO 30 MINUTES BEFORE FIRST MEAL OF THE DAY (Patient not taking: Reported on 04/13/2018)  . [DISCONTINUED] ondansetron (ZOFRAN ODT) 4 MG disintegrating tablet Take 1 tablet (4 mg total) by mouth every 8 (eight) hours as needed for nausea or vomiting. (Patient not taking: Reported on 04/13/2018)  . [DISCONTINUED] polyethylene glycol powder (GLYCOLAX/MIRALAX) powder MIX ONE CAPFUL IN LIQUID AND DRINK DAILY AS NEEDED. (Patient not taking: Reported on 04/13/2018)  . [DISCONTINUED] PROLENSA 0.07 % SOLN Place 1 drop into the left eye at bedtime.  . [DISCONTINUED] timolol (TIMOPTIC) 0.25 % ophthalmic  solution INT 1 GTT IN OU QAM   No facility-administered encounter medications on file as of 04/13/2018.     Activities of Daily Living In your present state of health, do you have any difficulty performing the following activities: 04/13/2018  Hearing? N  Vision? N  Difficulty concentrating or making decisions? N  Walking or climbing stairs? Y  Comment some, holds onto rails   Dressing or bathing? N  Doing errands, shopping? Y  Comment has Therapist, art and eating ? N  Using the Toilet? N  In the past six months, have you accidently leaked urine? Y  Do you have problems with loss of bowel control? N  Managing your Medications? N  Managing your Finances? N  Housekeeping or managing your Housekeeping? N  Some recent data might be hidden    Patient Care Team: Olin Hauser, DO as PCP - General (Family Medicine) Ardelle Lesches, MD as Referring Physician (Internal Medicine)   Assessment:   This is a routine wellness examination for [facility].  Exercise Activities and Dietary recommendations Current Exercise Habits: The patient does not participate in regular exercise at present(hasnt went due to transportation ), Exercise limited by: None identified  Goals    . DIET - INCREASE WATER INTAKE     Recommend drinking at least 6-8 glasses of water a day        Fall Risk Fall Risk  04/13/2018 03/29/2018 12/25/2017 10/26/2017 08/12/2017  Falls in the past year? 0 0 No No No  Number falls in past yr: 0 - - - -  Follow up - Falls evaluation completed - - -   FALL RISK PREVENTION PERTAINING TO THE HOME:  Any stairs in or around the home WITH handrails? Yes  Home free of loose throw rugs in walkways, pet beds, electrical cords, etc? Yes  Adequate lighting in your home to reduce risk of falls? Yes   ASSISTIVE DEVICES UTILIZED TO PREVENT FALLS:  Life alert? No  Use of a cane, walker or w/c? Cane sometimes  Grab bars in the bathroom? No  Shower chair  or bench in shower? No  Elevated toilet seat or a handicapped toilet? No   DME ORDERS:  DME order needed?  No   TIMED UP AND GO:  Was the test performed? Yes .  Length of time to ambulate 10 feet: 12 sec.   GAIT:  Appearance of gait: Gait stead-fast with the use of an assistive device.  Education: Fall risk prevention has been discussed.  Intervention(s) required? No    Depression Screen PHQ 2/9 Scores 04/13/2018 03/29/2018 12/25/2017 10/26/2017  PHQ - 2 Score 0 0 0 0  PHQ- 9 Score - - - -    Cognitive Function unable to perform due to language barrier         Immunization History  Administered Date(s) Administered  . Influenza, High Dose Seasonal PF 11/24/2016  . Influenza,inj,Quad PF,6+ Mos 12/15/2012, 12/07/2014  . Influenza-Unspecified 12/07/2014, 02/13/2016, 12/02/2017  . Pneumococcal Conjugate-13 04/07/2017  . Pneumococcal Polysaccharide-23 04/25/2013, 04/13/2018  . Tdap 12/15/2012  . Zoster Recombinat (Shingrix) 06/22/2016    Qualifies for Shingles Vaccine? Yes, completed first dose  Tdap: up to date   Flu Vaccine: up to date   Pneumococcal Vaccine: up to date   Screening Tests Health Maintenance  Topic Date Due  . HEMOGLOBIN A1C  04/22/2018  . PNA vac Low Risk Adult (2 of 2 - PPSV23) 04/25/2018  . OPHTHALMOLOGY EXAM  09/16/2018  . FOOT EXAM  10/27/2018  . TETANUS/TDAP  12/16/2022  . COLONOSCOPY  02/07/2027  . INFLUENZA VACCINE  Completed  . Hepatitis C Screening  Completed   Cancer Screenings:  Colorectal Screening: Completed 02/06/2018. Repeat every 10 years    Lung Cancer Screening: (Low Dose CT Chest recommended if Age 48-80 years, 30 pack-year currently smoking OR have quit w/in 15years.) does not qualify.    Additional Screening:  Hepatitis C Screening: does qualify; Completed 10/26/2014  Vision Screening: Recommended annual ophthalmology exams for early detection of glaucoma and other disorders of the eye. Is the patient up to date  with their annual eye exam?  Yes  Who is the provider or what is the name of the office in which the pt attends annual eye exams? [facility]   Dental Screening: Recommended annual dental exams for proper oral hygiene  Community Resource Referral:  CRR required this visit?  No       Plan:    I have personally reviewed and addressed the Medicare Annual Wellness questionnaire and have noted the following in the patient's chart:  A. Medical and social history B. Use of alcohol, tobacco or illicit drugs  C. Current medications and supplements D. Functional ability and status E.  Nutritional status F.  Physical activity G. Advance directives H. List of other physicians I.  Hospitalizations, surgeries, and ER visits in previous 12 months J.  [place] such as hearing and vision if needed, cognitive and depression L. Referrals and appointments   In addition, I have reviewed and discussed with patient certain preventive protocols, quality metrics, and best practice recommendations. A written personalized care plan for preventive services as well as general preventive health recommendations were provided to patient.   Signed,  Tyler Aas, LPN Nurse Health Advisor   Nurse Notes:none

## 2018-04-13 NOTE — Patient Instructions (Addendum)
[name] , Thank you for taking time to come for your Medicare Wellness Visit. I appreciate your ongoing commitment to your health goals. Please review the following plan we discussed and let me know if I can assist you in the future.   Screening recommendations/referrals: Colonoscopy: completed 02/06/2017 Recommended yearly ophthalmology/optometry visit for glaucoma screening and checkup Recommended yearly dental visit for hygiene and checkup  Vaccinations: Influenza vaccine: up to date Pneumococcal vaccine: done today  Tdap vaccine: up to date Shingles vaccine: up to date    Advanced directives: will obtain a spanish form for you, please pick up when you come in on 05/03/2018  Conditions/risks identified: none  Next appointment: Follow up on 05/03/2018 at 1:20pm with Dr.Karamalegos.   Preventive Care 15 Years and Older, Male Preventive care refers to lifestyle choices and visits with your health care provider that can promote health and wellness. What does preventive care include?  A yearly physical exam. This is also called an annual well check.  Dental exams once or twice a year.  Routine eye exams. Ask your health care provider how often you should have your eyes checked.  Personal lifestyle choices, including:  Daily care of your teeth and gums.  Regular physical activity.  Eating a healthy diet.  Avoiding tobacco and drug use.  Limiting alcohol use.  Practicing safe sex.  Taking low doses of aspirin every day.  Taking vitamin and mineral supplements as recommended by your health care provider. What happens during an annual well check? The services and screenings done by your health care provider during your annual well check will depend on your age, overall health, lifestyle risk factors, and family history of disease. Counseling  Your health care provider may ask you questions about your:  Alcohol use.  Tobacco use.  Drug use.  Emotional  well-being.  Home and relationship well-being.  Sexual activity.  Eating habits.  History of falls.  Memory and ability to understand (cognition).  Work and work Statistician. Screening  You may have the following tests or measurements:  Height, weight, and BMI.  Blood pressure.  Lipid and cholesterol levels. These may be checked every 5 years, or more frequently if you are over 36 years old.  Skin check.  Lung cancer screening. You may have this screening every year starting at age 16 if you have a 30-pack-year history of smoking and currently smoke or have quit within the past 15 years.  Fecal occult blood test (FOBT) of the stool. You may have this test every year starting at age 59.  Flexible sigmoidoscopy or colonoscopy. You may have a sigmoidoscopy every 5 years or a colonoscopy every 10 years starting at age 62.  Prostate cancer screening. Recommendations will vary depending on your family history and other risks.  Hepatitis C blood test.  Hepatitis B blood test.  Sexually transmitted disease (STD) testing.  Diabetes screening. This is done by checking your blood sugar (glucose) after you have not eaten for a while (fasting). You may have this done every 1-3 years.  Abdominal aortic aneurysm (AAA) screening. You may need this if you are a current or former smoker.  Osteoporosis. You may be screened starting at age 35 if you are at high risk. Talk with your health care provider about your test results, treatment options, and if necessary, the need for more tests. Vaccines  Your health care provider may recommend certain vaccines, such as:  Influenza vaccine. This is recommended every year.  Tetanus, diphtheria, and  acellular pertussis (Tdap, Td) vaccine. You may need a Td booster every 10 years.  Zoster vaccine. You may need this after age 97.  Pneumococcal 13-valent conjugate (PCV13) vaccine. One dose is recommended after age 62.  Pneumococcal  polysaccharide (PPSV23) vaccine. One dose is recommended after age 2. Talk to your health care provider about which screenings and vaccines you need and how often you need them. This information is not intended to replace advice given to you by your health care provider. Make sure you discuss any questions you have with your health care provider. Document Released: 03/16/2015 Document Revised: 11/07/2015 Document Reviewed: 12/19/2014 Elsevier Interactive Patient Education  2017 [place] Prevention in the Home Falls can cause injuries. They can happen to people of all ages. There are many things you can do to make your home safe and to help prevent falls. What can I do on the outside of my home?  Regularly fix the edges of walkways and driveways and fix any cracks.  Remove anything that might make you trip as you walk through a door, such as a raised step or threshold.  Trim any bushes or trees on the path to your home.  Use bright outdoor lighting.  Clear any walking paths of anything that might make someone trip, such as rocks or tools.  Regularly check to see if handrails are loose or broken. Make sure that both sides of any steps have handrails.  Any raised decks and porches should have guardrails on the edges.  Have any leaves, snow, or ice cleared regularly.  Use sand or salt on walking paths during winter.  Clean up any spills in your garage right away. This includes oil or grease spills. What can I do in the bathroom?  Use night lights.  Install grab bars by the toilet and in the tub and shower. Do not use towel bars as grab bars.  Use non-skid mats or decals in the tub or shower.  If you need to sit down in the shower, use a plastic, non-slip stool.  Keep the floor dry. Clean up any water that spills on the floor as soon as it happens.  Remove soap buildup in the tub or shower regularly.  Attach bath mats securely with double-sided non-slip rug  tape.  Do not have throw rugs and other things on the floor that can make you trip. What can I do in the bedroom?  Use night lights.  Make sure that you have a light by your bed that is easy to reach.  Do not use any sheets or blankets that are too big for your bed. They should not hang down onto the floor.  Have a firm chair that has side arms. You can use this for support while you get dressed.  Do not have throw rugs and other things on the floor that can make you trip. What can I do in the kitchen?  Clean up any spills right away.  Avoid walking on wet floors.  Keep items that you use a lot in easy-to-reach places.  If you need to reach something above you, use a strong step stool that has a grab bar.  Keep electrical cords out of the way.  Do not use floor polish or wax that makes floors slippery. If you must use wax, use non-skid floor wax.  Do not have throw rugs and other things on the floor that can make you trip. What can I do with my stairs?  Do not leave any items on the stairs.  Make sure that there are handrails on both sides of the stairs and use them. Fix handrails that are broken or loose. Make sure that handrails are as long as the stairways.  Check any carpeting to make sure that it is firmly attached to the stairs. Fix any carpet that is loose or worn.  Avoid having throw rugs at the top or bottom of the stairs. If you do have throw rugs, attach them to the floor with carpet tape.  Make sure that you have a light switch at the top of the stairs and the bottom of the stairs. If you do not have them, ask someone to add them for you. What else can I do to help prevent falls?  Wear shoes that:  Do not have high heels.  Have rubber bottoms.  Are comfortable and fit you well.  Are closed at the toe. Do not wear sandals.  If you use a stepladder:  Make sure that it is fully opened. Do not climb a closed stepladder.  Make sure that both sides of the  stepladder are locked into place.  Ask someone to hold it for you, if possible.  Clearly mark and make sure that you can see:  Any grab bars or handrails.  First and last steps.  Where the edge of each step is.  Use tools that help you move around (mobility aids) if they are needed. These include:  Canes.  Walkers.  Scooters.  Crutches.  Turn on the lights when you go into a dark area. Replace any light bulbs as soon as they burn out.  Set up your furniture so you have a clear path. Avoid moving your furniture around.  If any of your floors are uneven, fix them.  If there are any pets around you, be aware of where they are.  Review your medicines with your doctor. Some medicines can make you feel dizzy. This can increase your chance of falling. Ask your doctor what other things that you can do to help prevent falls. This information is not intended to replace advice given to you by your health care provider. Make sure you discuss any questions you have with your health care provider. Document Released: 12/14/2008 Document Revised: 07/26/2015 Document Reviewed: 03/24/2014 Elsevier Interactive Patient Education  2017 [place] por tomarse el tiempo de asistir a su visita de Therapist, sports de [place]. Agradezco su compromiso continuo con sus objetivos de [place]. Revise el siguiente plan que discutimos y avseme si puedo ayudarlo en el futuro.  Recomendaciones de evaluacin / referencias: Colonoscopia: completada el 02/06/2017 Visita anual recomendada de oftalmologa / optometra para deteccin y chequeo de glaucoma Visita dental anual recomendada para higiene y chequeo  Vacunas Vacuna contra la influenza: Ross Riley antineumoccica: Ross Riley Tdap: Ross Riley contra el herpes zster: actualizada  Directivas avanzadas: obtendr un formulario en espaol para usted, recjalo cuando venga el 05/03/2018  Condiciones /  riesgos identificados: ninguno  Prxima cita: Seguimiento el 05/03/2018 a las 1:20 pm con Dr.Karamalegos.    Cuidados preventivos en los hombres a Proofreader de los 34 aos de edad Preventive Care 23 Years and Older, Male  Los cuidados preventivos hacen referencia a las opciones en cuanto al estilo de vida y a las visitas al mdico, las cuales pueden promover la salud y Musician. Qu incluyen los cuidados preventivos?  Un examen fsico anual. Esto tambin se conoce  como control de Aeronautical engineer.  Exmenes dentales una o dos veces al ao.  Exmenes de la vista de rutina. Pregntele al mdico con qu frecuencia debe realizarse un control de la vista.  Opciones personales de estilo de vida, que incluyen lo siguiente: ? [employer] y las encas a diario. ? Realizar actividad fsica con regularidad. ? Tener una dieta saludable. ? Evitar el consumo de tabaco y drogas. ? Limitar el consumo de bebidas alcohlicas. ? Counsellor. ? Tomar una dosis baja de [employer]. ? Tomar los suplementos de vitaminas o minerales como se lo haya indicado el mdico. Qu sucede durante un control de bienestar anual? Los servicios y exmenes de deteccin realizados por su mdico durante el control de bienestar anual dependern de su salud general, factores de riesgo de estilo de vida y los antecedentes familiares de enfermedades. Asesoramiento Su mdico puede preguntarle acerca de:  Consumo de alcohol.  Consumo de tabaco.  Consumo de drogas.  Bienestar emocional.  Bienestar en el hogar y las relaciones personales.  Actividad sexual.  Hbitos de alimentacin.  Antecedentes de cadas.  Memoria y capacidad de comprensin (facultades cognitivas).  Trabajo y [place] laboral.  Pruebas de deteccin Pueden hacerle las siguientes pruebas o mediciones:  IT consultant, peso e ndice de masa muscular [facility]).  Presin arterial.  Niveles de lpidos y colesterol.  Estos se pueden verificar cada 5 aos o, con ms frecuencia, si usted tiene ms de 75 aos de edad.  Control de la piel.  Pruebas de deteccin de cncer de pulmn. Es posible que se le realice esta prueba de deteccin a partir de los 29 aos de edad, si ha fumado durante 30 aos un paquete diario y sigue fumando o dej el hbito en algn momento en los ltimos 15 aos.  Prueba de Personnel officer en las heces [facility]). Es posible que se le realice esta prueba todos los aos a partir de los 60 aos de [place].  Sigmoidoscopa o colonoscopa flexible. Es posible que se le realice una sigmoidoscopa cada 5 aos o una colonoscopa cada 10 aos a partir de los 62 aos de [place].  Examen de deteccin del cncer de prstata. Las recomendaciones variarn segn sus antecedentes familiares y Hydrologist.  Anlisis de sangre para la deteccin de la hepatitis C.  Anlisis de sangre para la deteccin de la hepatitis B.  Anlisis de enfermedades de transmisin sexual (ETS).  Pruebas de deteccin de la diabetes. Esto se Set designer un control del azcar en la sangre (glucosa) despus de no haber comido durante un periodo de tiempo (ayuno). Es posible que se le realice esta prueba cada 1 a 3 tres aos.  Estudio de deteccin de aneurisma artico abdominal (AAA). Es posible que necesite este estudio si es fumador o lo fue en el pasado.  Osteoporosis. Se le puede realizar este estudio de deteccin a partir de los 20 aos de edad si tiene un Social research officer, government.  Hable con su mdico [employer], las opciones de tratamiento y, si corresponde, la necesidad de Optometrist ms pruebas. Vacunas  El mdico puede recomendarle que se aplique algunas vacunas, por ejemplo:  Vacuna contra la gripe. Se recomienda aplicarse esta vacuna todos los aos.  Vacuna contra la difteria, ttanos y tos Dietitian (DTPa, DT). Es posible que tenga que aplicarse un refuerzo contra el ttanos y la difteria (DT) cada  10aos.  Vacuna contra la varicela. Es posible que tenga que aplicrsela  si no recibi esta vacuna.  Vacuna contra el herpes zster. Es posible que la necesite despus de los 41 aos de edad.  Vacuna contra el sarampin, rubola y paperas (SRP). Es posible que necesite aplicarse al menos una dosis de la vacuna SRP si naci despus de [phone]. Podra tambin necesitar una segunda dosis.  Vacuna antineumoccica conjugada 13 valente (PCV13). Se recomienda una dosis despus de los 75 aos de [place].  Vacuna antineumoccica de polisacridos (PPSV23). Se recomienda una dosis despus de los 69 aos de [place].  Vacuna antimeningoccica. Puede necesitar esta vacuna si tiene determinadas afecciones.  Vacuna contra la hepatitis A. Es posible que necesite esta vacuna si tiene ciertas afecciones o si viaja o trabaja en lugares en los que podra estar expuesto a la hepatitis A.  Vacuna contra la hepatitis B. Es posible que necesite esta vacuna si tiene ciertas afecciones o si viaja o trabaja en lugares en los que podra estar expuesto a la hepatitis B.  Vacuna contra antihaemophilus influenzae tipoB (Hib). Es posible que necesite esta vacuna si tiene determinados factores de [place].  Hable con el mdico sobre qu pruebas de deteccin y qu vacunas necesita, y con qu frecuencia las necesita. Esta informacin no tiene Marine scientist el consejo del mdico. Asegrese de hacerle al mdico cualquier pregunta que tenga. Document Released: 03/16/2015 Document Revised: 06/13/2016 Document Reviewed: 12/19/2014 Elsevier Interactive Patient Education  [employer]. .

## 2018-04-19 DIAGNOSIS — H401132 Primary open-angle glaucoma, bilateral, moderate stage: Secondary | ICD-10-CM | POA: Diagnosis not present

## 2018-04-21 ENCOUNTER — Encounter: Payer: Self-pay | Admitting: Family Medicine

## 2018-04-21 ENCOUNTER — Other Ambulatory Visit: Payer: Self-pay

## 2018-04-21 ENCOUNTER — Ambulatory Visit (INDEPENDENT_AMBULATORY_CARE_PROVIDER_SITE_OTHER): Payer: Medicare Other | Admitting: Family Medicine

## 2018-04-21 VITALS — BP 123/71 | HR 56 | Temp 98.7°F | Resp 16 | Ht 66.0 in | Wt 175.6 lb

## 2018-04-21 DIAGNOSIS — H9202 Otalgia, left ear: Secondary | ICD-10-CM

## 2018-04-21 DIAGNOSIS — H6983 Other specified disorders of Eustachian tube, bilateral: Secondary | ICD-10-CM

## 2018-04-21 DIAGNOSIS — J0100 Acute maxillary sinusitis, unspecified: Secondary | ICD-10-CM | POA: Diagnosis not present

## 2018-04-21 MED ORDER — FLUTICASONE PROPIONATE 50 MCG/ACT NA SUSP: 2.0000 | Freq: Every day | NASAL | 3 refills | Status: DC

## 2018-04-21 MED ORDER — PSEUDOEPH-BROMPHEN-DM 30-2-10 MG/5ML PO SYRP: 5.0000 mL | ORAL_SOLUTION | Freq: Four times a day (QID) | ORAL | 0 refills | Status: DC | PRN

## 2018-04-21 NOTE — Patient Instructions (Addendum)
Thank you for coming to the office today.  For ear pain it looks like fluid behind ear  Try cough syrup as needed  Start nasal steroid Flonase 2 sprays in each nostril daily for 4-6 weeks, may repeat course seasonally or as needed  Goal to reduce fluid to help pain  HOld antibiotic for now - if not improving or you get fever - let me know  If ear pain persists then we need to refer you to ENT specialist, this is similar to May 2019  Please schedule a Follow-up Appointment to: Return in about 2 weeks (around 05/05/2018), or if symptoms worsen or fail to improve, for ear pain, sinus.  If you have any other questions or concerns, please feel free to call the office or send a message through [place]. You may also schedule an earlier appointment if necessary.  Additionally, you may be receiving a survey about your experience at our office within a few days to 1 week by e-mail or mail. We value your feedback.  [name], DO [place]

## 2018-04-21 NOTE — Progress Notes (Signed)
Subjective:    Patient ID: Ross Riley, male    DOB: [birth year], 68 y.o.   MRN: [id number]  Ross Riley is a 68 y.o. male presenting on 04/21/2018 for Cough (ear pair more in Left side denies fever, runny nose onset week)  Patient presents for a same day appointment.  HPI   Bilateral Ear Pain, LEFT > Right / Sinusitis / Cough Reports symptoms onset with L ear pain >1-2 week ago, then worsening to have sinus congestion cough, mostly dry cough. He admits runny nose. Seems L ear pain is episodic - he is requesting cough syrup - In past he was treated 07/2017 for similar, seems to have improved -Denies any fever or chills, nausea vomiting, reduced hearing, headache, ear drainage  Additionally he asks about his recent CT neck imaging, results were already called to his family and he had some questions.  Health Maintenance: UTD Flu Vaccine  Depression screen [facility] 2/9 04/21/2018 04/13/2018 03/29/2018  Decreased Interest 0 0 0  Down, Depressed, Hopeless 0 0 0  PHQ - 2 Score 0 0 0  Altered sleeping - - -  Tired, decreased energy - - -  Change in appetite - - -  Feeling bad or failure about yourself  - - -  Trouble concentrating - - -  Moving slowly or fidgety/restless - - -  Suicidal thoughts - - -  PHQ-9 Score - - -  Difficult doing work/chores - - -    Social History   Tobacco Use  . Smoking status: Former Smoker    Packs/day: 1.00    Years: 32.00    Pack years: 32.00    Types: Cigarettes    Last attempt to quit: 03/04/1991    Years since quitting: 27.1  . Smokeless tobacco: Former Network engineer Use Topics  . Alcohol use: Yes    Alcohol/week: 0.0 standard drinks    Comment: occasional  . Drug use: No    Review of Systems Per HPI unless specifically indicated above     Objective:    BP 123/71   Pulse (!) 56   Temp 98.7 F (37.1 C) (Oral)   Resp 16   Ht 5\' 6"  (1.676 m)   Wt 175 lb 9.6 oz (79.7 kg)   SpO2 99%   BMI 28.34 kg/m   Wt Readings from Last 3  Encounters:  04/21/18 175 lb 9.6 oz (79.7 kg)  04/13/18 173 lb 6.4 oz (78.7 kg)  03/29/18 175 lb (79.4 kg)    Physical Exam Vitals signs and nursing note reviewed.  Constitutional:      General: He is not in acute distress.    Appearance: He is well-developed. He is not diaphoretic.     Comments: Well-appearing, comfortable, cooperative  HENT:     Head: Normocephalic and atraumatic.     Comments: Frontal / maxillary sinuses non-tender. Nares with some turbinate edema without purulence.  R TM mostly normal, slight clear effusion. No erythema.  L TM some shrunken appearance with associated effusion, no erythema  Oropharynx clear without erythema, exudates, edema or asymmetry. Eyes:     General:        Right eye: No discharge.        Left eye: No discharge.     Conjunctiva/sclera: Conjunctivae normal.  Neck:     Musculoskeletal: Normal range of motion and neck supple.     Thyroid: No thyromegaly.  Cardiovascular:     Rate and Rhythm: Normal rate and regular  rhythm.     Heart sounds: Normal heart sounds. No murmur.  Pulmonary:     Effort: Pulmonary effort is normal. No respiratory distress.     Breath sounds: Normal breath sounds. No wheezing or rales.  Musculoskeletal: Normal range of motion.  Lymphadenopathy:     Cervical: No cervical adenopathy.  Skin:    General: Skin is warm and dry.     Findings: No erythema or rash.  Neurological:     Mental Status: He is alert and oriented to person, place, and time.  Psychiatric:        Behavior: Behavior normal.     Comments: Well groomed, good eye contact, normal speech and thoughts    Results for orders placed or performed during the hospital encounter of 04/05/18  I-STAT creatinine  Result Value Ref Range   Creatinine, Ser 0.90 0.61 - 1.24 mg/dL      Assessment & Plan:   Problem List Items Addressed This Visit    None    Visit Diagnoses    Eustachian tube dysfunction, bilateral    -  Primary   Relevant Medications     fluticasone (FLONASE) 50 MCG/ACT nasal spray   Left ear pain       Acute non-recurrent maxillary sinusitis       Relevant Medications   brompheniramine-pseudoephedrine-DM 30-2-10 MG/5ML syrup   fluticasone (FLONASE) 50 MCG/ACT nasal spray      Acute on chronic now Recurrent L>Reustachian tube dysfunction with secondary ear effusion, without loss of hearing or evidence of AOM or sinusitis. Likely related allergic rhinosinusitis based on exam and history. - Inadequate conservative therapy   Plan: 1. Start Flonase 2 sprays each nare daily for up to 4-6 weeks or longer - Add Bromfed cough syrup rx sent 2. May start OTC oral anti-histamine daily 3. Recommend may consider trial OTC oral decongestant for up to 1 week or less  If not improving - can call and consider antibiotic course (prefer to hold off due to history of c diff)  Additionally discussed potential benefit of oral steroid, prednisone taper for short course up to 20mg  daily x 3 days, only if refractory symptoms, counseled on potential side effects  Follow-up if not improved or worsening, return criteria - next step is refer to ENT since he has been treated for similar problem in past 2019 - question if any injury to L TM   Meds ordered this encounter  Medications  . brompheniramine-pseudoephedrine-DM 30-2-10 MG/5ML syrup    Sig: Take 5 mLs by mouth 4 (four) times daily as needed.    Dispense:  118 mL    Refill:  0  . fluticasone (FLONASE) 50 MCG/ACT nasal spray    Sig: Place 2 sprays into both nostrils daily. Use for 4-6 weeks then stop and use seasonally or as needed.    Dispense:  16 g    Refill:  3      Follow up plan: Return in about 2 weeks (around 05/05/2018), or if symptoms worsen or fail to improve, for ear pain, sinus.   Ross Riley, [facility] 04/21/2018, 11:55 AM

## 2018-04-26 DIAGNOSIS — E119 Type 2 diabetes mellitus without complications: Secondary | ICD-10-CM | POA: Diagnosis not present

## 2018-04-26 DIAGNOSIS — E039 Hypothyroidism, unspecified: Secondary | ICD-10-CM | POA: Diagnosis not present

## 2018-04-26 LAB — HEMOGLOBIN A1C: Hemoglobin A1C: 6.6

## 2018-05-03 ENCOUNTER — Other Ambulatory Visit: Payer: Self-pay

## 2018-05-03 ENCOUNTER — Ambulatory Visit (INDEPENDENT_AMBULATORY_CARE_PROVIDER_SITE_OTHER): Payer: Medicare Other | Admitting: Nurse Practitioner

## 2018-05-03 ENCOUNTER — Ambulatory Visit: Payer: Medicare Other | Admitting: Family Medicine

## 2018-05-03 VITALS — BP 139/75 | HR 62 | Temp 98.4°F | Ht 66.0 in | Wt 173.2 lb

## 2018-05-03 DIAGNOSIS — E1136 Type 2 diabetes mellitus with diabetic cataract: Secondary | ICD-10-CM | POA: Diagnosis not present

## 2018-05-03 DIAGNOSIS — G248 Other dystonia: Secondary | ICD-10-CM

## 2018-05-03 NOTE — Patient Instructions (Addendum)
Ross Riley,   Thank you for coming in to clinic today.  1. We will call you for replacement shoes once we find a place that will replace your shoes.  They will call you.  Please schedule a follow-up appointment with Dr. Parks Ranger.  Return in about 11 weeks (around 07/19/2018) for hyperlipidemia, hypertension.  If you have any other questions or concerns, please feel free to call the clinic or send a message through [place]. You may also schedule an earlier appointment if necessary.  You will receive a survey after today's visit either digitally by e-mail or paper by [employer]. Your experiences and feedback matter to [place].  Please respond so we know how we are doing as we provide care for you.   Cassell Smiles, DNP, AGNP-BC Adult Gerontology Nurse Practitioner [place]

## 2018-05-03 NOTE — Progress Notes (Signed)
Subjective:    Patient ID: Ross Riley, male    DOB: 06/17/1950, 68 y.o.   MRN: [id number]  Ross Riley is a 68 y.o. male presenting on 05/03/2018 for Diabetes (pt requesting presription for diabetic shoes)   HPI Diabetes Patient requests diabetes shoes for new order/replacement. He is being followed by [facility] for his diabetes currently. His last shoes were provided by [facility] [place] [facility] [phone] / Fax [phone].  - He states he now feels he needs a new type of wedge because he is having foot pain with current shoe.  It has been several years since he has seen orthopedics or neurology.  Patient has dystonia in left ankle since 1994 per chart review.  Uses AFO brace for prolonged walking, but takes breaks to reduce damage.  Uses a cane otherwise. Uses a cane for assistance with walking, occasionally uses a walker as well depending on location and duration of walking.  - Patient reports no falls in last year. - Currently using orthotic inserts, AFO and Left shoe lateral wedge.  These treatments were previously recommended by orthopedics and neurology and his last visit with neurosurgery was in 11/2017 for menigioma.    Recent Labs     05/22/17 1148 10/20/17 0811 04/26/2018 [facility]  HGBA1C 6.2* 6.7* 6.6*   Social History   Tobacco Use  . Smoking status: Former Smoker    Packs/day: 1.00    Years: 32.00    Pack years: 32.00    Types: Cigarettes    Last attempt to quit: 03/04/1991    Years since quitting: 27.1  . Smokeless tobacco: Former Network engineer Use Topics  . Alcohol use: Yes    Alcohol/week: 0.0 standard drinks    Comment: occasional  . Drug use: No    Review of Systems Per HPI unless specifically indicated above     Objective:    BP 139/75 (BP Location: Right Arm)   Pulse 62   Temp 98.4 F (36.9 C) (Oral)   Ht 5\' 6"  (1.676 m)   Wt 173 lb 3.2 oz (78.6 kg)   BMI  27.96 kg/m   Wt Readings from Last 3 Encounters:  05/03/18 173 lb 3.2 oz (78.6 kg)  04/21/18 175 lb 9.6 oz (79.7 kg)  04/13/18 173 lb 6.4 oz (78.7 kg)    Physical Exam Vitals signs reviewed.  Constitutional:      General: He is not in acute distress.    Appearance: He is well-developed.  HENT:     Head: Normocephalic and atraumatic.  Cardiovascular:     Rate and Rhythm: Normal rate and regular rhythm.     Pulses:          Radial pulses are 2+ on the right side and 2+ on the left side.       Dorsalis pedis pulses are 1+ on the right side and 1+ on the left side.       Posterior tibial pulses are 1+ on the right side and 1+ on the left side.     Heart sounds: Normal heart sounds, S1 normal and S2 normal.  Pulmonary:     Effort: Pulmonary effort is normal. No respiratory distress.     Breath sounds: Normal breath sounds and air entry.  Musculoskeletal:     Right lower leg: No edema.     Left lower leg: No edema.     Right foot: No  Charcot foot, foot drop or prominent metatarsal heads.     Left foot: No Charcot foot, foot drop or prominent metatarsal heads.  Feet:     Right foot:     Protective Sensation: 8 sites tested. 8 sites sensed.     Skin integrity: Skin integrity normal.     Left foot:     Protective Sensation: 8 sites tested. 8 sites sensed.     Skin integrity: Skin integrity normal.     Comments: LEFT foot: Pointed toes present always, with prominent internal rotation, supination.  Altered gait with foot drop, lateral left toes make impact with ground first.  - Significantly limited AROM in all directions (dorsiflex/plantarflex, inversion, eversion)  RIGHT foot: normal foot appearance.  No foot drop, internal rotation.  Has mild supination. Normal AROM. Skin:    General: Skin is warm and dry.     Capillary Refill: Capillary refill takes less than 2 seconds.  Neurological:     Mental Status: He is alert and oriented to person, place, and time.  Psychiatric:         Attention and Perception: Attention normal.        Mood and Affect: Mood and affect normal.        Behavior: Behavior normal. Behavior is cooperative.    Diabetic Foot Exam - Simple   Simple Foot Form Diabetic Foot exam was performed with the following findings:  Yes 05/03/2018  1:30 PM  Visual Inspection See comments:  Yes Sensation Testing Intact to touch and monofilament testing bilaterally:  Yes Pulse Check Posterior Tibialis and Dorsalis pulse intact bilaterally:  Yes Comments LEFT foot: Pointed toes present always, with prominent internal rotation, supination.  Altered gait with foot drop, lateral left toes make impact with ground first.  - Significantly limited AROM in all directions (dorsiflex/plantarflex, inversion, eversion)  RIGHT foot: normal foot appearance.  No foot drop, internal rotation.  Has mild supination. Normal AROM.      Results for orders placed or performed during the hospital encounter of 04/05/18  I-STAT creatinine  Result Value Ref Range   Creatinine, Ser 0.90 0.61 - 1.24 mg/dL      Assessment & Plan:   Problem List Items Addressed This Visit      Endocrine   Type 2 diabetes mellitus with diabetic cataract, without long-term current use of insulin (HCC) - Primary     Nervous and Auditory   Focal dystonia      Patient with well-controlled T2DM managed by endocrinology.  Patient requests replacement of orthotic shoes, which are indicated for focal dystonia of left ankle.  Patient has persistent left foot drop with supination during initial contact phase of gait.  Plan: 1. Recommend replacement orthotic shoe with wedge and orthotic inserts for left foot.   2. Continue follow-up with endocrinology.  Consider repeat visit with orthopedics if patient has consistent pain with walking.  May need to use AFO more consistently.  3. Follow-up as planned in about 3 months with Dr. Parks Ranger.  Follow up plan: Return in about 11 weeks (around 07/19/2018) for  hyperlipidemia, hypertension.  Cassell Smiles, DNP, AGPCNP-BC Adult Gerontology Primary Care Nurse Practitioner [facility] 05/03/2018, 1:32 PM

## 2018-05-06 ENCOUNTER — Encounter: Payer: Self-pay | Admitting: Nurse Practitioner

## 2018-05-07 ENCOUNTER — Encounter: Payer: Self-pay | Admitting: Family Medicine

## 2018-05-10 ENCOUNTER — Telehealth: Payer: Self-pay | Admitting: Family Medicine

## 2018-05-10 NOTE — Telephone Encounter (Signed)
Received request for patient to have new order for Lateral Shoe Wedge sent to  [facility] [place] [place], [place] [address] Ph [phone] Fax [phone]  New hand written rx for "Lateral Shoe Wedge, Left Foot" disp quantity 1, dx focal dystonia (left leg) G24.8, and S/p TKR Left knee (J54.237), Diabetes (E11.36)  To be faxed 05/11/18  [name], [facility] 05/10/2018, 5:24 PM

## 2018-05-11 NOTE — Telephone Encounter (Signed)
Was faxed.

## 2018-05-18 ENCOUNTER — Ambulatory Visit: Payer: Self-pay

## 2018-06-04 ENCOUNTER — Telehealth: Payer: Self-pay | Admitting: Family Medicine

## 2018-06-04 NOTE — Telephone Encounter (Signed)
See prior notes regarding DM Shoe Lateral Wedge order.  Received fax from [facility] [phone] Fax [phone]  Order for:  [place], Add to Diabetic Shoe, Per Shoe [phone] - Off-set Heel, Add to Diabetic Shoe, per shoe LEFT Quantity 1 each  Signed the provided LMN and attached last office visit note - to be faxed Monday 06/07/18  [name], [facility] 06/04/2018, 4:28 PM

## 2018-06-29 ENCOUNTER — Telehealth: Payer: Self-pay | Admitting: Family Medicine

## 2018-06-29 NOTE — Telephone Encounter (Signed)
Pt  Daughter called  [facility] requesting a prescription for diabete  Shoe. Marla call back # is [phone]

## 2018-06-29 NOTE — Telephone Encounter (Signed)
Please refer to the last orders on 4/3 regarding his diabetic shoes and the lateral shoe wedge.  If they are requesting any diabetic shoe supply - we need receive a faxed request of the order from the medical supply company.  I thought this was already completed?  [name], [facility] 06/29/2018, 6:39 PM

## 2018-06-30 NOTE — Telephone Encounter (Addendum)
I spoke with the patient daughter and she informed me that the [facility] received the order for the wedge, but now they need a prescription for the diabetic shoes.    I contacted the [facility] and they informed me that the patient declined the wedges because he didn't want to pay the $70.00 out of pocket for the wedges. Now the patient is requesting diabetic shoes. They will fax over the order form to fill out for the request. It needs to be faxed back with the most recent office note that address the need for diabetic shoes.

## 2018-06-30 NOTE — Telephone Encounter (Signed)
Thanks - will complete the DM Shoe form once I receive it.  It looks like last office visit for Diabetic Shoe eval was done on 05/03/18.  [name], [facility] 06/30/2018, 1:19 PM

## 2018-07-19 ENCOUNTER — Ambulatory Visit: Payer: Medicare Other | Admitting: Family Medicine

## 2018-07-23 ENCOUNTER — Other Ambulatory Visit: Payer: Self-pay | Admitting: Family Medicine

## 2018-07-23 ENCOUNTER — Other Ambulatory Visit: Payer: Self-pay

## 2018-07-23 ENCOUNTER — Encounter: Payer: Self-pay | Admitting: Family Medicine

## 2018-07-23 ENCOUNTER — Ambulatory Visit (INDEPENDENT_AMBULATORY_CARE_PROVIDER_SITE_OTHER): Payer: Medicare Other | Admitting: Family Medicine

## 2018-07-23 VITALS — BP 133/78 | Temp 98.3°F | Ht 66.0 in | Wt 165.0 lb

## 2018-07-23 DIAGNOSIS — N5319 Other ejaculatory dysfunction: Secondary | ICD-10-CM

## 2018-07-23 DIAGNOSIS — E785 Hyperlipidemia, unspecified: Secondary | ICD-10-CM

## 2018-07-23 DIAGNOSIS — M159 Polyosteoarthritis, unspecified: Secondary | ICD-10-CM

## 2018-07-23 DIAGNOSIS — E1169 Type 2 diabetes mellitus with other specified complication: Secondary | ICD-10-CM

## 2018-07-23 DIAGNOSIS — E1136 Type 2 diabetes mellitus with diabetic cataract: Secondary | ICD-10-CM

## 2018-07-23 DIAGNOSIS — M25561 Pain in right knee: Secondary | ICD-10-CM | POA: Diagnosis not present

## 2018-07-23 DIAGNOSIS — Z0000 Encounter for general adult medical examination without abnormal findings: Secondary | ICD-10-CM

## 2018-07-23 DIAGNOSIS — I10 Essential (primary) hypertension: Secondary | ICD-10-CM

## 2018-07-23 DIAGNOSIS — E039 Hypothyroidism, unspecified: Secondary | ICD-10-CM

## 2018-07-23 DIAGNOSIS — N400 Enlarged prostate without lower urinary tract symptoms: Secondary | ICD-10-CM

## 2018-07-23 DIAGNOSIS — M150 Primary generalized (osteo)arthritis: Secondary | ICD-10-CM | POA: Diagnosis not present

## 2018-07-23 MED ORDER — METHYLPREDNISOLONE ACETATE 40 MG/ML IJ SUSP: 40.0000 mg | Freq: Once | INTRAMUSCULAR | Status: DC

## 2018-07-23 MED ORDER — LIDOCAINE HCL (PF) 1 % IJ SOLN: 4.0000 mL | Freq: Once | INTRAMUSCULAR | Status: DC

## 2018-07-23 MED ORDER — DICLOFENAC SODIUM 1 % TD GEL: 2.0000 g | Freq: Four times a day (QID) | TRANSDERMAL | 2 refills | Status: DC

## 2018-07-23 NOTE — Progress Notes (Signed)
Subjective:    Patient ID: Ross Riley, male    DOB: 06-Jun-1950, 68 y.o.   MRN: [id number]  Ross Riley is a 68 y.o. male presenting on 07/23/2018 for Hypertension and Knee Pain (constant stabbing  Right knee pain x 2 weeks ago. Standing, walking makes the pain worse. Pt states the knee makes sounds when bending. )   HPI   Right Knee Pain / history of OA/DJD knees, s/p L knee surgery Reports new problem x 2 week ago R medial aspect knee pain worse with prolong standing and walking, worse with bending knee, has a grinding sound that it makes when he bends it. - he has L foot drop dystonia and uses cane for ambulation - has history of arthritis in knees and in other joints Denies swelling or redness, fall or injury  Ejaculation Dysfunction / BPH S/p TURP  Prior history [facility] Dr Pilar Jarvis in 2016 for BPH s/p TURP. He has done well since but now has new complaint with lack of ejaculation. He says it is not painful or not other issues, no difficulty with erection.   Depression screen [facility] 2/9 04/21/2018 04/13/2018 03/29/2018  Decreased Interest 0 0 0  Down, Depressed, Hopeless 0 0 0  PHQ - 2 Score 0 0 0  Altered sleeping - - -  Tired, decreased energy - - -  Change in appetite - - -  Feeling bad or failure about yourself  - - -  Trouble concentrating - - -  Moving slowly or fidgety/restless - - -  Suicidal thoughts - - -  PHQ-9 Score - - -  Difficult doing work/chores - - -    Social History   Tobacco Use  . Smoking status: Former Smoker    Packs/day: 1.00    Years: 32.00    Pack years: 32.00    Types: Cigarettes    Last attempt to quit: 03/04/1991    Years since quitting: 27.4  . Smokeless tobacco: Former Network engineer Use Topics  . Alcohol use: Yes    Alcohol/week: 0.0 standard drinks    Comment: occasional  . Drug use: No    Review of Systems Per HPI unless specifically indicated above     Objective:    BP 133/78   Temp 98.3 F (36.8 C) (Oral)   Ht  5\' 6"  (1.676 m)   Wt 165 lb (74.8 kg)   BMI 26.63 kg/m   Wt Readings from Last 3 Encounters:  07/23/18 165 lb (74.8 kg)  05/03/18 173 lb 3.2 oz (78.6 kg)  04/21/18 175 lb 9.6 oz (79.7 kg)    Physical Exam Vitals signs and nursing note reviewed.  Constitutional:      General: He is not in acute distress.    Appearance: He is well-developed. He is not diaphoretic.     Comments: Well-appearing, comfortable, cooperative  HENT:     Head: Normocephalic and atraumatic.  Eyes:     General:        Right eye: No discharge.        Left eye: No discharge.     Conjunctiva/sclera: Conjunctivae normal.  Cardiovascular:     Rate and Rhythm: Normal rate.  Pulmonary:     Effort: Pulmonary effort is normal.  Musculoskeletal:     Comments: Right  Knee Inspection: Normal appearance and symmetrical. No ecchymosis or effusion. Palpation: Mild +TTP R knee only medial joint line. Significant fine crepitus crepitus ROM: Full active ROM bilaterally Special Testing:  Lachman / Valgus/Varus tests negative with intact ligaments (ACL, MCL, LCL). Strength: 5/5 intact knee flex/ext, ankle dorsi/plantarflex Neurovascular: distally intact sensation light touch and pulses   Skin:    General: Skin is warm and dry.     Findings: No erythema or rash.  Neurological:     Mental Status: He is alert and oriented to person, place, and time.  Psychiatric:        Behavior: Behavior normal.     Comments: Well groomed, good eye contact, normal speech and thoughts      ________________________________________________________ PROCEDURE NOTE Date: 07/23/18 Right knee steroid injection Discussed benefits and risks (including pain, bleeding, infection, steroid flare). Verbal consent given by patient. Medication:  1 cc Depo-medrol 40mg  and 4 cc Lidocaine 1% without epi Time Out taken  Landmarks identified. Area cleansed with alcohol wipes. Using 21 gauge and 1, 1/2 inch needle, Right knee joint space was injected  (with above listed medication) via medial approach cold spray used for superficial anesthetic. Sterile bandage placed. Patient tolerated procedure well without bleeding or paresthesias. No complications.   Results for orders placed or performed in visit on 05/07/18  Hemoglobin A1c  Result Value Ref Range   Hemoglobin A1C 6.6       Assessment & Plan:   Problem List Items Addressed This Visit    Osteoarthritis of multiple joints - Primary   Relevant Medications   methylPREDNISolone acetate (DEPO-MEDROL) injection 40 mg (Start on 07/23/2018 11:45 AM)   diclofenac sodium (VOLTAREN) 1 % GEL    Other Visit Diagnoses    Medial knee pain, right       Relevant Medications   lidocaine (PF) (XYLOCAINE) 1 % injection 4 mL (Start on 07/23/2018 11:45 AM)   methylPREDNISolone acetate (DEPO-MEDROL) injection 40 mg (Start on 07/23/2018 11:45 AM)   diclofenac sodium (VOLTAREN) 1 % GEL  Acute R medial Knee pain without swelling without known injury or trauma  Suspected OA/DJD bilateral, since known L sided with TKR - Able to bear weight, no knee instability or mechanical locking - No prior history of RIGHT knee surgery, arthroscopy - Inadequate conservative therapy    Plan: Right knee steroid injection. See above procedure note. Tolerated well.  1. Rx topical Diclofenac TID PRN 2. Start Tylenol 500-1000mg  per dose TID PRN breakthrough 3. RICE therapy (rest, ice, compression, elevation) for swelling, activity modification 4. Hold imaging unlikely fracture based on mechanism 5. Follow-up >4 weeks, if still worsening, consider steroid injection and referral to Ortho for further eval, may need MRI if concern remains for meniscus / ligament injury     Disorder of ejaculation       Relevant Orders   Ambulatory referral to Urology      Referral to return to [facility] for ejaculation problem now, lack of ejaculation, question if possible retrograde. He is s/p TURP 2016. With BPH  Meds ordered  this encounter  Medications  . lidocaine (PF) (XYLOCAINE) 1 % injection 4 mL  . methylPREDNISolone acetate (DEPO-MEDROL) injection 40 mg  . diclofenac sodium (VOLTAREN) 1 % GEL    Sig: Apply 2 g topically 4 (four) times daily.    Dispense:  100 g    Refill:  2     Follow up plan: Return in about 3 months (around 10/23/2018) for Annual Physical.  Future labs ordered for 10/25/18  [name], [facility] 07/23/2018, 11:29 AM

## 2018-07-23 NOTE — Patient Instructions (Addendum)
Thank you for coming to the office today.  Referral to Urology for the problem with ejaculation  [facility] floor [place],    [id number] Phone: [phone]  You received a Right Knee Joint steroid injection today. - Lidocaine numbing medicine may ease the pain initially for a few hours until it wears off - As discussed, you may experience a "steroid flare" this evening or within 24-48 hours, anytime medicine is injected into an inflamed joint it can cause the pain to get worse temporarily - Everyone responds differently to these injections, it depends on the patient and the severity of the joint problem, it may provide anywhere from days to weeks, to months of relief. Ideal response is >6 months relief - Try to take it easy for next 1-2 days, avoid over activity and strain on joint (limit walking for knee) - Recommend the following:   - For swelling - rest, compression sleeve / ACE wrap, elevation, and ice packs as needed for first few days   - For pain in future may use heating pad or moist heat as needed  Medication - topical diclofenac as needed up to 3 times a day if need  Recommend to start taking Tylenol Extra Strength 500mg  tabs - take 1 to 2 tabs per dose (max 1000mg ) every 6-8 hours for pain (take regularly, don't skip a dose for next 7 days), max 24 hour daily dose is 6 tablets or 3000mg . In the future you can repeat the same everyday Tylenol course for 1-2 weeks at a time.     Please schedule a Follow-up Appointment to: Return in about 3 months (around 10/23/2018) for Annual Physical.  If you have any other questions or concerns, please feel free to call the office or send a message through [facility]. You may also schedule an earlier appointment if necessary.  Additionally, you may be receiving a survey about your experience at our office within a few days to 1 week by e-mail or mail. We value your  feedback.  [name], DO [facility]

## 2018-07-29 ENCOUNTER — Telehealth: Payer: Self-pay

## 2018-07-29 NOTE — Telephone Encounter (Signed)
I called and spoke with Dawn to verify that she received the resubmitted fax from 07/05/18 concerning the patient diabetic shoes. She informed me that she received it and verified that all requirement are met with application.

## 2018-08-02 ENCOUNTER — Telehealth: Payer: Self-pay

## 2018-08-02 DIAGNOSIS — M159 Polyosteoarthritis, unspecified: Secondary | ICD-10-CM

## 2018-08-02 DIAGNOSIS — M1711 Unilateral primary osteoarthritis, right knee: Secondary | ICD-10-CM

## 2018-08-02 NOTE — Telephone Encounter (Signed)
Pt called to reporting no improvement with the R knee pain after the injection.  [phone]

## 2018-08-02 NOTE — Telephone Encounter (Signed)
Referral to [facility] for chronic Right knee pain and swelling, he has history of Left knee TKR, he has focal Left dystonia and gait abnormality, requires special shoe wedge and cane for assistance. S/p R knee steroid injection 07/2018 without significant relief   Ross Putnam, DO [facility] 08/02/2018, 5:04 PM

## 2018-08-02 NOTE — Telephone Encounter (Signed)
Patient has complex history with known left foot dystonia weakness and difficulty with ambulation, he has had prior left knee replacement, he has arthritis.  If topical diclofenac medicine and injection did not provide any relief, then next step is to see Orthopedic specialist. I do not have much else to offer.  Please notify patient and identify which orthopedic office he wants to go to - either a previous orthopedic or a new one.  [name], DO [facility] 08/02/2018, 1:09 PM

## 2018-08-02 NOTE — Telephone Encounter (Signed)
The pt have no preference on what Orthopaedic to be referred to.

## 2018-08-02 NOTE — Addendum Note (Signed)
Addended by: Olin Hauser on: 08/02/2018 05:04 PM   Modules accepted: Orders

## 2018-08-03 NOTE — Telephone Encounter (Signed)
Attempted to contact the pt. No answer. LMOM to return my call.

## 2018-08-03 NOTE — Telephone Encounter (Signed)
The pt was notified. No questions or concerns. 

## 2018-08-13 ENCOUNTER — Other Ambulatory Visit: Payer: Self-pay | Admitting: Family Medicine

## 2018-08-13 DIAGNOSIS — E1136 Type 2 diabetes mellitus with diabetic cataract: Secondary | ICD-10-CM

## 2018-08-16 ENCOUNTER — Other Ambulatory Visit: Payer: Self-pay

## 2018-08-16 NOTE — Patient Outreach (Signed)
[facility]) Care Management  08/16/2018  ZIMIR KITTLESON 03-23-1950 [id number]   Medication Adherence call to Mr. Sevrin Sally HIPPA Compliant Voice message left with a call back number. Mr. Thorstenson is showing past due on Glipizide er 5 mg and Benazepril/Hctz 10/12.5 mg under [place].   [facility] Management Direct Dial [phone]  Fax [phone] [email or web address]

## 2018-08-18 ENCOUNTER — Other Ambulatory Visit: Payer: Self-pay | Admitting: Family Medicine

## 2018-08-19 ENCOUNTER — Other Ambulatory Visit: Payer: Self-pay | Admitting: Family Medicine

## 2018-08-31 DIAGNOSIS — M25561 Pain in right knee: Secondary | ICD-10-CM | POA: Diagnosis not present

## 2018-09-08 DIAGNOSIS — E119 Type 2 diabetes mellitus without complications: Secondary | ICD-10-CM | POA: Diagnosis not present

## 2018-09-08 DIAGNOSIS — E039 Hypothyroidism, unspecified: Secondary | ICD-10-CM | POA: Diagnosis not present

## 2018-09-13 DIAGNOSIS — E039 Hypothyroidism, unspecified: Secondary | ICD-10-CM | POA: Diagnosis not present

## 2018-09-13 DIAGNOSIS — E119 Type 2 diabetes mellitus without complications: Secondary | ICD-10-CM | POA: Diagnosis not present

## 2018-09-13 DIAGNOSIS — I10 Essential (primary) hypertension: Secondary | ICD-10-CM | POA: Diagnosis not present

## 2018-09-13 DIAGNOSIS — M25561 Pain in right knee: Secondary | ICD-10-CM | POA: Diagnosis not present

## 2018-09-14 ENCOUNTER — Other Ambulatory Visit: Payer: Self-pay

## 2018-09-14 ENCOUNTER — Encounter: Payer: Self-pay | Admitting: Urology

## 2018-09-14 ENCOUNTER — Ambulatory Visit (INDEPENDENT_AMBULATORY_CARE_PROVIDER_SITE_OTHER): Payer: Medicare Other | Admitting: Urology

## 2018-09-14 VITALS — BP 154/92 | HR 53 | Ht 66.0 in | Wt 170.0 lb

## 2018-09-14 DIAGNOSIS — N5319 Other ejaculatory dysfunction: Secondary | ICD-10-CM | POA: Diagnosis not present

## 2018-09-14 DIAGNOSIS — N5201 Erectile dysfunction due to arterial insufficiency: Secondary | ICD-10-CM | POA: Insufficient documentation

## 2018-09-14 NOTE — Progress Notes (Signed)
09/14/2018 5:41 PM   Ross Riley [birth year] [id number]  Referring provider: Olin Hauser, DO [address] [place],  [place] [address]  Chief Complaint  Patient presents with  . Erectile Dysfunction    HPI: 68 year old male referred by Dr. Parks Ranger for evaluation of erectile dysfunction and loss of seminal emission.  He states approximately 2 months ago he had sudden loss of semen.  He is able to achieve orgasm however has no seminal emission.  He does have a history of BPH and had a TURP by Dr. Pilar Jarvis in 2016 however states this problem did not occur until 2 months ago.  He also has erectile dysfunction and is on sildenafil however states this problem also worsened 2 months ago.  He has diabetes but not insulin-dependent or longstanding.  He denies the use of any new medications over the past 2 months.  He is not on SSRI medications.  A testosterone level performed in October 2019 was 400.   PMH: Past Medical History:  Diagnosis Date  . Arthritis   . BPH (benign prostatic hyperplasia)   . Chronic kidney disease    kidney stones  . Diabetes mellitus without complication ([place])   . Dystonia   . ED (erectile dysfunction)   . GERD (gastroesophageal reflux disease)   . Glaucoma (increased eye pressure)   . Hematuria   . Hyperlipidemia   . Hypertension     Surgical History: Past Surgical History:  Procedure Laterality Date  . EXTRACORPOREAL SHOCK WAVE LITHOTRIPSY Right   . KIDNEY STONE SURGERY  2009  . PROSTATE SURGERY  2011  . TRANSURETHRAL RESECTION OF PROSTATE N/A 12/06/2014   Procedure: TRANSURETHRAL RESECTION OF THE PROSTATE (TURP);  Surgeon: Nickie Retort, MD;  Location: [facility] ORS;  Service: Urology;  Laterality: N/A;    Home Medications:  Allergies as of 09/14/2018      Reactions   Metformin And Related Other (See Comments)   Headache and dizzines   Metformin Hcl Other (See Comments)   headache and dizziness      Medication List       Accurate as of September 14, 2018  5:41 PM. If you have any questions, ask your nurse or doctor.        Accu-Chek Aviva Plus w/Device Kit Use device to check blood sugar up to 1 time daily as directed   Accu-Chek FastClix Lancets Misc check blood sugar up to 1 time daily as directed   aspirin EC 81 MG tablet Take by mouth.   atorvastatin 20 MG tablet Commonly known as: LIPITOR TAKE 1 TABLET(20 MG) BY MOUTH DAILY   B-D SINGLE USE SWABS REGULAR Pads check blood sugar up to 1 time daily as directed   benazepril-hydrochlorthiazide 10-12.5 MG tablet Commonly known as: LOTENSIN HCT Take 1 tablet by mouth daily.   brimonidine 0.2 % ophthalmic solution Commonly known as: ALPHAGAN brimonidine 0.2 % eye drops  INSTILL 1 DROP INTO OU TID   diclofenac sodium 1 % Gel Commonly known as: VOLTAREN Apply 2 g topically 4 (four) times daily.   dorzolamide 2 % ophthalmic solution Commonly known as: TRUSOPT Apply to eye.   dorzolamide-timolol 22.3-6.8 MG/ML ophthalmic solution Commonly known as: COSOPT dorzolamide 22.3 mg-timolol 6.8 mg/mL eye drops   fluticasone 50 MCG/ACT nasal spray Commonly known as: FLONASE Place 2 sprays into both nostrils daily. Use for 4-6 weeks then stop and use seasonally or as needed.   gabapentin 300 MG capsule Commonly known as: NEURONTIN Please  follow titration schedule to reach goal dose 600 mg three times a day.   glipiZIDE 5 MG 24 hr tablet Commonly known as: GLUCOTROL XL TAKE 1 TABLET(5 MG) BY MOUTH DAILY WITH BREAKFAST   glucose blood test strip check blood sugar up to 1 time daily as directed   latanoprost 0.005 % ophthalmic solution Commonly known as: XALATAN INT 1 GTT INTO OU QHS   levothyroxine 75 MCG tablet Commonly known as: SYNTHROID Take by mouth. What changed: Another medication with the same name was removed. Continue taking this medication, and follow the directions you see here. Changed by: Abbie Sons, MD   metoprolol  tartrate 25 MG tablet Commonly known as: LOPRESSOR Take 0.5 tablets (12.5 mg total) by mouth 2 (two) times daily.   omeprazole 40 MG capsule Commonly known as: PRILOSEC TAKE 1 CAPSULE BY MOUTH DAILY BEFORE BREAKFAST 15 TO 30 MINUTES BEFORE FIRST MEAL OF THE DAY   sildenafil 20 MG tablet Commonly known as: REVATIO Take 1-5 pills about 30 min prior to sex. Start with 1 and increase as needed.       Allergies:  Allergies  Allergen Reactions  . Metformin And Related Other (See Comments)    Headache and dizzines  . Metformin Hcl Other (See Comments)    headache and dizziness    Family History: Family History  Family history unknown: Yes    Social History:  reports that he quit smoking about 27 years ago. His smoking use included cigarettes. He has a 32.00 pack-year smoking history. He has quit using smokeless tobacco. He reports current alcohol use. He reports that he does not use drugs.  ROS: UROLOGY Frequent Urination?: No Hard to postpone urination?: No Burning/pain with urination?: No Get up at night to urinate?: No Leakage of urine?: No Urine stream starts and stops?: No Trouble starting stream?: No Do you have to strain to urinate?: No Blood in urine?: No Urinary tract infection?: No Sexually transmitted disease?: No Injury to kidneys or bladder?: No Painful intercourse?: No Weak stream?: No Erection problems?: Yes Penile pain?: No  Gastrointestinal Nausea?: No Vomiting?: No Indigestion/heartburn?: No Diarrhea?: No Constipation?: No  Constitutional Fever: No Night sweats?: No Weight loss?: No Fatigue?: No  Skin Skin rash/lesions?: No Itching?: No  Eyes Blurred vision?: Yes Double vision?: No  Ears/Nose/Throat Sore throat?: No Sinus problems?: No  Hematologic/Lymphatic Swollen glands?: No Easy bruising?: No  Cardiovascular Leg swelling?: No Chest pain?: No  Respiratory Cough?: No Shortness of breath?: No  Endocrine Excessive  thirst?: No  Musculoskeletal Back pain?: No Joint pain?: No  Neurological Headaches?: No Dizziness?: No  Psychologic Depression?: No Anxiety?: No  Physical Exam: BP (!) 154/92 (BP Location: Left Arm, Patient Position: Sitting, Cuff Size: Normal)   Pulse (!) 53   Ht _0  (1.676 m)   Wt 170 lb (77.1 kg)   BMI 27.44 kg/m   Constitutional:  Alert and oriented, No acute distress. HEENT: [place] AT, moist mucus membranes.  Trachea midline, no masses. Cardiovascular: No clubbing, cyanosis, or edema. Respiratory: Normal respiratory effort, no increased work of breathing. GI: Abdomen is soft, nontender, nondistended, no abdominal masses GU: No CVA tenderness.  Penis without lesions, testes descended bilaterally without masses or tenderness. Lymph: No cervical or inguinal lymphadenopathy. Skin: No rashes, bruises or suspicious lesions. Neurologic: Grossly intact, no focal deficits, moving all 4 extremities. Psychiatric: Normal mood and affect.  Laboratory Data:  Lab Results  Component Value Date   TESTOSTERONE 411 12/29/2017  Assessment & Plan:   68 year old male with erectile dysfunction and ejaculatory dysfunction.  He has a history of TURP 4 years ago but just recently had loss of seminal admission.  He is on no medications that should affect ejaculation.  He has diabetes but not severe and is on oral hypoglycemics.  Will recheck his testosterone level and he will return for an a.m. testosterone.   Abbie Sons, [place] [address] [place], [place] [address] [phone]

## 2018-09-16 DIAGNOSIS — M1711 Unilateral primary osteoarthritis, right knee: Secondary | ICD-10-CM | POA: Diagnosis not present

## 2018-09-20 ENCOUNTER — Ambulatory Visit (INDEPENDENT_AMBULATORY_CARE_PROVIDER_SITE_OTHER): Payer: Medicare Other | Admitting: Family Medicine

## 2018-09-20 ENCOUNTER — Encounter: Payer: Self-pay | Admitting: Family Medicine

## 2018-09-20 ENCOUNTER — Other Ambulatory Visit: Payer: Self-pay

## 2018-09-20 VITALS — BP 122/69 | HR 61 | Temp 98.9°F | Resp 16 | Ht 66.0 in | Wt 170.0 lb

## 2018-09-20 DIAGNOSIS — K5901 Slow transit constipation: Secondary | ICD-10-CM

## 2018-09-20 DIAGNOSIS — R1032 Left lower quadrant pain: Secondary | ICD-10-CM

## 2018-09-20 MED ORDER — POLYETHYLENE GLYCOL 3350 17 GM/SCOOP PO POWD: 17.0000 g | Freq: Every day | ORAL | 1 refills | Status: DC | PRN

## 2018-09-20 MED ORDER — DICYCLOMINE HCL 10 MG PO CAPS: 10.0000 mg | ORAL_CAPSULE | Freq: Three times a day (TID) | ORAL | 0 refills | Status: DC

## 2018-09-20 NOTE — Progress Notes (Signed)
Subjective:    Patient ID: Ross Riley, male    DOB: [birth year], 68 y.o.   MRN: [id number]  Ross Riley is a 68 y.o. male presenting on 09/20/2018 for Abdominal Pain (left side onset 4 days)   Patient presents for a same day appointment.   HPI   Left LQ Abdominal Pain Recent problem about 4 days ago onset with LLQ abdominal pain seems to be episodic brief spells only lasting few seconds at a time, fairly deeper sharp or episodic pain. No associated symptoms except he did have no bowel movement yesterday seemed to be mild constipation, today had Bm some improvement, but this had not been an issue lately. - Not tried any medication - In past 2094 he had complicated history of Diverticulitis and C Diff Colitis, was followed by [facility] in past, he does not plan to return to them, he would like to go to local GI if he has to in future. He had complicated course with multiple antibiotic courses caused C Diff. Denies any significant active pain now, nausea vomiting fever chills, dark stool blood in stools diarrhea  Additional complaint - Abnormal skin lesions - he has dermatologist, asking my opinion today on a few skin lesions, not causing him any pain or symptoms, but they are larger growths.   Depression screen [facility] 2/9 09/20/2018 04/21/2018 04/13/2018  Decreased Interest 0 0 0  Down, Depressed, Hopeless 0 0 0  PHQ - 2 Score 0 0 0  Altered sleeping - - -  Tired, decreased energy - - -  Change in appetite - - -  Feeling bad or failure about yourself  - - -  Trouble concentrating - - -  Moving slowly or fidgety/restless - - -  Suicidal thoughts - - -  PHQ-9 Score - - -  Difficult doing work/chores - - -    Social History   Tobacco Use  . Smoking status: Former Smoker    Packs/day: 1.00    Years: 32.00    Pack years: 32.00    Types: Cigarettes    Quit date: 03/04/1991    Years since quitting: 27.5  . Smokeless tobacco: Former Network engineer Use Topics  . Alcohol use:  Yes    Alcohol/week: 0.0 standard drinks    Comment: occasional  . Drug use: No    Review of Systems Per HPI unless specifically indicated above     Objective:    BP 122/69   Pulse 61   Temp 98.9 F (37.2 C) (Oral)   Resp 16   Ht 5\' 6"  (1.676 m)   Wt 170 lb (77.1 kg)   BMI 27.44 kg/m   Wt Readings from Last 3 Encounters:  09/20/18 170 lb (77.1 kg)  09/14/18 170 lb (77.1 kg)  07/23/18 165 lb (74.8 kg)    Physical Exam Vitals signs and nursing note reviewed.  Constitutional:      General: He is not in acute distress.    Appearance: He is well-developed. He is not diaphoretic.     Comments: Well-appearing, comfortable, cooperative  HENT:     Head: Normocephalic and atraumatic.  Eyes:     General:        Right eye: No discharge.        Left eye: No discharge.     Conjunctiva/sclera: Conjunctivae normal.  Cardiovascular:     Rate and Rhythm: Normal rate.  Pulmonary:     Effort: Pulmonary effort is normal.  Abdominal:  General: Bowel sounds are decreased.     Tenderness: There is abdominal tenderness (left lower quadrant). There is no guarding or rebound. Negative signs include Murphy's sign and McBurney's sign.  Skin:    General: Skin is warm and dry.     Findings: No erythema or rash.  Neurological:     Mental Status: He is alert and oriented to person, place, and time.  Psychiatric:        Behavior: Behavior normal.     Comments: Well groomed, good eye contact, normal speech and thoughts    Results for orders placed or performed in visit on 05/07/18  Hemoglobin A1c  Result Value Ref Range   Hemoglobin A1C 6.6       Assessment & Plan:   Problem List Items Addressed This Visit    None    Visit Diagnoses    Left lower quadrant abdominal pain    -  Primary   Relevant Medications   dicyclomine (BENTYL) 10 MG capsule   Slow transit constipation       Relevant Medications   polyethylene glycol powder (GLYCOLAX/MIRALAX) 17 GM/SCOOP powder       Clinically today seems most consistent with constipation causing LLQ symptoms, episodic with some bloating, decreased bowel sounds, and episodic LLQ pain. Some reproduced on exam otherwise mostly benign. - In past known history of C Diff Colitis and Diverticulitis, he says it is mild compared to those prior flares  Plan - Start Miralax for constipation as advised to help regulate bowels - Trial on Dicyclomine PRN cramping - Monitor symptoms, if persistent issue and not improving - we will refer to local [facility] instead of return to [facility], he will need to notify us  Meds ordered this encounter  Medications  . dicyclomine (BENTYL) 10 MG capsule    Sig: Take 1 capsule (10 mg total) by mouth 4 (four) times daily -  before meals and at bedtime. As needed for abdominal cramping    Dispense:  30 capsule    Refill:  0  . polyethylene glycol powder (GLYCOLAX/MIRALAX) 17 GM/SCOOP powder    Sig: Take 17-34 g by mouth daily as needed for mild constipation or moderate constipation.    Dispense:  225 g    Refill:  1     Follow up plan: Return in about 1 week (around 09/27/2018), or if symptoms worsen or fail to improve, for Abdominal pain.   [name], [facility] 09/20/2018, 1:42 PM

## 2018-09-20 NOTE — Patient Instructions (Addendum)
Thank you for coming to the office today.  For Constipation (less frequent bowel movement that can be hard dry or involve straining).  Recommend trying OTC Miralax 17g = 1 capful in large glass water once daily for now, try several days to see if working, goal is soft stool or BM 1-2 times daily, if too loose then reduce dose or try every other day. If not effective may need to increase it to 2 doses at once in AM or may do 1 in morning and 1 in afternoon/evening  - This medicine is very safe and can be used often without any problem and will not make you dehydrated. It is good for use on AS NEEDED BASIS or even MAINTENANCE therapy for longer term for several days to weeks at a time to help regulate bowel movements  Other more natural remedies or preventative treatment: - Increase hydration with water - Increase fiber in diet (high fiber foods = vegetables, leafy greens, oats/grains) - May take OTC Fiber supplement (metamucil powder or pill/gummy) - May try OTC Probiotic   Also for abdominal cramping and pain try Dicyclomine - take one pill up to 3-4 times a day for abdominal pain and cramping. If not hurting you can skip this pill.  If not improving and we need to refer - can go to [facility]) [place], [place] [address] Phone: [phone]  Please schedule a Follow-up Appointment to: Return in about 1 week (around 09/27/2018), or if symptoms worsen or fail to improve, for Abdominal pain.  If you have any other questions or concerns, please feel free to call the office or send a message through [place]. You may also schedule an earlier appointment if necessary.  Additionally, you may be receiving a survey about your experience at our office within a few days to 1 week by e-mail or mail. We value your feedback.  [name], DO [place]

## 2018-09-22 ENCOUNTER — Other Ambulatory Visit: Payer: Self-pay

## 2018-09-22 ENCOUNTER — Other Ambulatory Visit: Payer: Medicare Other

## 2018-09-22 DIAGNOSIS — N5201 Erectile dysfunction due to arterial insufficiency: Secondary | ICD-10-CM

## 2018-09-23 ENCOUNTER — Telehealth: Payer: Self-pay | Admitting: *Deleted

## 2018-09-23 LAB — TESTOSTERONE: Testosterone: 446 ng/dL (ref 264–916)

## 2018-09-23 NOTE — Telephone Encounter (Signed)
Since his testosterone level was normal there is no treatment for the problem he was initially seen for so he can follow-up as needed.

## 2018-09-23 NOTE — Telephone Encounter (Addendum)
Informed patient via interpreter services. Verbalized understanding. He would like to know if he needs a follow up. Please advise.  ----- Message from Abbie Sons, MD sent at 09/23/2018  7:11 AM EDT ----- Testosterone level was normal at 446.  Needs interpreter

## 2018-09-24 ENCOUNTER — Encounter: Payer: Self-pay | Admitting: *Deleted

## 2018-09-24 NOTE — Telephone Encounter (Addendum)
Appointment scheduled with [facility] per Dr. Bernardo Heater   Relayed message via interpreter services-would like to be seen for ED. He has been taking sildenafil and has not been working.  He stated he would call back to schedule an appointment when he knows his schedule.

## 2018-09-27 LAB — HM DIABETES EYE EXAM

## 2018-09-29 NOTE — Progress Notes (Signed)
09/30/2018 3:17 PM   Ross Riley [birth year] [id number]  Referring provider: Olin Hauser, DO [address] [place],  [place] [address]  Chief Complaint  Patient presents with  . Erectile Dysfunction    HPI: Ross Riley is a 68 year old male with ED who presents today to discuss further options with interpreter, Ronnald Collum.    Erectile dysfunction His SHIM score is 5, which is severe ED.   His major complaint is lack of his ability to have sex multiple times daily.   His libido is preserved.   His risk factors for ED are age, BPH, DM, HTN, HLD and hypothyroidism.  He denies any painful erections or curvatures with his erections.   He is still having spontaneous erections.  He has tried sildenafil 20 mg in the past with out success.   SHIM    Row Name 09/30/18 1452         SHIM: Over the last 6 months:   How do you rate your confidence that you could get and keep an erection?  Very Low     When you had erections with sexual stimulation, how often were your erections hard enough for penetration (entering your partner)?  Almost Never or Never     During sexual intercourse, how often were you able to maintain your erection after you had penetrated (entered) your partner?  Almost Never or Never     During sexual intercourse, how difficult was it to maintain your erection to completion of intercourse?  Extremely Difficult     When you attempted sexual intercourse, how often was it satisfactory for you?  Almost Never or Never       SHIM Total Score   SHIM  5        Score: 1-7 Severe ED 8-11 Moderate ED 12-16 Mild-Moderate ED 17-21 Mild ED 22-25 No ED      PMH: Past Medical History:  Diagnosis Date  . Arthritis   . BPH (benign prostatic hyperplasia)   . Chronic kidney disease    kidney stones  . Diabetes mellitus without complication ([place])   . Dystonia   . ED (erectile dysfunction)   . GERD (gastroesophageal reflux disease)   . Glaucoma (increased eye  pressure)   . Hematuria   . Hyperlipidemia   . Hypertension     Surgical History: Past Surgical History:  Procedure Laterality Date  . EXTRACORPOREAL SHOCK WAVE LITHOTRIPSY Right   . KIDNEY STONE SURGERY  2009  . PROSTATE SURGERY  2011  . TRANSURETHRAL RESECTION OF PROSTATE N/A 12/06/2014   Procedure: TRANSURETHRAL RESECTION OF THE PROSTATE (TURP);  Surgeon: Nickie Retort, MD;  Location: [facility] ORS;  Service: Urology;  Laterality: N/A;    Home Medications:  Allergies as of 09/30/2018      Reactions   Metformin And Related Other (See Comments)   Headache and dizzines   Metformin Hcl Other (See Comments)   headache and dizziness      Medication List       Accurate as of September 30, 2018  3:17 PM. If you have any questions, ask your nurse or doctor.        STOP taking these medications   gabapentin 300 MG capsule Commonly known as: NEURONTIN Stopped by: Luciann Gossett, PA-C   polyethylene glycol powder 17 GM/SCOOP powder Commonly known as: GLYCOLAX/MIRALAX Stopped by: Leocadia Idleman, PA-C   sildenafil 20 MG tablet Commonly known as: REVATIO Stopped by: Zara Council, PA-C  TAKE these medications   Accu-Chek Aviva Plus w/Device Kit Use device to check blood sugar up to 1 time daily as directed   Accu-Chek FastClix Lancets Misc check blood sugar up to 1 time daily as directed   aspirin EC 81 MG tablet Take by mouth.   atorvastatin 20 MG tablet Commonly known as: LIPITOR TAKE 1 TABLET(20 MG) BY MOUTH DAILY   B-D SINGLE USE SWABS REGULAR Pads check blood sugar up to 1 time daily as directed   benazepril-hydrochlorthiazide 10-12.5 MG tablet Commonly known as: LOTENSIN HCT Take 1 tablet by mouth daily.   brimonidine 0.2 % ophthalmic solution Commonly known as: ALPHAGAN brimonidine 0.2 % eye drops  INSTILL 1 DROP INTO OU TID   diclofenac sodium 1 % Gel Commonly known as: VOLTAREN Apply 2 g topically 4 (four) times daily.   dicyclomine 10 MG  capsule Commonly known as: BENTYL Take 1 capsule (10 mg total) by mouth 4 (four) times daily -  before meals and at bedtime. As needed for abdominal cramping   dorzolamide 2 % ophthalmic solution Commonly known as: TRUSOPT Apply to eye.   dorzolamide-timolol 22.3-6.8 MG/ML ophthalmic solution Commonly known as: COSOPT dorzolamide 22.3 mg-timolol 6.8 mg/mL eye drops   fluticasone 50 MCG/ACT nasal spray Commonly known as: FLONASE Place 2 sprays into both nostrils daily. Use for 4-6 weeks then stop and use seasonally or as needed.   glipiZIDE 5 MG 24 hr tablet Commonly known as: GLUCOTROL XL TAKE 1 TABLET(5 MG) BY MOUTH DAILY WITH BREAKFAST   glucose blood test strip check blood sugar up to 1 time daily as directed   latanoprost 0.005 % ophthalmic solution Commonly known as: XALATAN INT 1 GTT INTO OU QHS   levothyroxine 75 MCG tablet Commonly known as: SYNTHROID Take by mouth.   metoprolol tartrate 25 MG tablet Commonly known as: LOPRESSOR Take 0.5 tablets (12.5 mg total) by mouth 2 (two) times daily.   omeprazole 40 MG capsule Commonly known as: PRILOSEC TAKE 1 CAPSULE BY MOUTH DAILY BEFORE BREAKFAST 15 TO 30 MINUTES BEFORE FIRST MEAL OF THE DAY   sildenafil 100 MG tablet Commonly known as: VIAGRA Take 1 tablet (100 mg total) by mouth daily as needed for erectile dysfunction. Take two hours prior to intercourse on an empty stomach Started by: Zara Council, PA-C       Allergies:  Allergies  Allergen Reactions  . Metformin And Related Other (See Comments)    Headache and dizzines  . Metformin Hcl Other (See Comments)    headache and dizziness    Family History: Family History  Family history unknown: Yes    Social History:  reports that he quit smoking about 27 years ago. His smoking use included cigarettes. He has a 32.00 pack-year smoking history. He has quit using smokeless tobacco. He reports current alcohol use. He reports that he does not use drugs.   ROS: UROLOGY Frequent Urination?: No Hard to postpone urination?: No Burning/pain with urination?: No Get up at night to urinate?: No Leakage of urine?: No Urine stream starts and stops?: No Trouble starting stream?: No Do you have to strain to urinate?: No Blood in urine?: No Urinary tract infection?: No Sexually transmitted disease?: No Injury to kidneys or bladder?: No Painful intercourse?: No Weak stream?: No Erection problems?: Yes Penile pain?: No  Gastrointestinal Nausea?: No Vomiting?: No Indigestion/heartburn?: No Diarrhea?: No Constipation?: No  Constitutional Fever: No Night sweats?: No Weight loss?: No Fatigue?: No  Skin Skin rash/lesions?: No Itching?:  No  Eyes Blurred vision?: No Double vision?: No  Ears/Nose/Throat Sore throat?: No Sinus problems?: No  Hematologic/Lymphatic Swollen glands?: No Easy bruising?: No  Cardiovascular Leg swelling?: No Chest pain?: No  Respiratory Cough?: No Shortness of breath?: No  Endocrine Excessive thirst?: No  Musculoskeletal Back pain?: No Joint pain?: No  Neurological Headaches?: No  Psychologic Depression?: Yes Anxiety?: No  Physical Exam: BP 133/89 (BP Location: Left Arm, Patient Position: Sitting, Cuff Size: Normal)   Pulse (!) 52   Ht '5\' 6"'$  (1.676 m)   Wt 170 lb (77.1 kg)   BMI 27.44 kg/m   Constitutional:  Well nourished. Alert and oriented, No acute distress. HEENT: [place] AT, moist mucus membranes.  Trachea midline, no masses. Cardiovascular: No clubbing, cyanosis, or edema. Respiratory: Normal respiratory effort, no increased work of breathing. Neurologic: Grossly intact, no focal deficits, moving all 4 extremities. Psychiatric: Normal mood and affect.  Laboratory Data: Lab Results  Component Value Date   WBC 9.4 10/20/2017   HGB 15.7 10/20/2017   HCT 46.8 10/20/2017   MCV 88.8 10/20/2017   PLT 219 10/20/2017    Lab Results  Component Value Date   CREATININE 0.90  04/05/2018    No results found for: PSA  Lab Results  Component Value Date   TESTOSTERONE 446 09/22/2018    Lab Results  Component Value Date   HGBA1C 6.6 04/26/2018    Lab Results  Component Value Date   TSH 2.88 10/20/2017       Component Value Date/Time   CHOL 157 10/20/2017 0811   CHOL 195 06/19/2015 0908   HDL 62 10/20/2017 0811   HDL 52 06/19/2015 0908   CHOLHDL 2.5 10/20/2017 0811   VLDL 42 (H) 10/05/2015 1119   LDLCALC 80 10/20/2017 0811    Lab Results  Component Value Date   AST 15 10/20/2017   Lab Results  Component Value Date   ALT 20 10/20/2017   No components found for: ALKALINEPHOPHATASE No components found for: BILIRUBINTOTAL  No results found for: ESTRADIOL  Urinalysis    Component Value Date/Time   COLORURINE YELLOW (A) 05/20/2016 [place] (A) 05/20/2016 1905   APPEARANCEUR Clear 12/07/2012 1602   LABSPEC 1.027 05/20/2016 1905   LABSPEC 1.015 12/07/2012 1602   PHURINE 6.0 05/20/2016 1905   GLUCOSEU NEGATIVE 05/20/2016 1905   GLUCOSEU Negative 12/07/2012 1602   HGBUR SMALL (A) 05/20/2016 1905   BILIRUBINUR NEGATIVE 05/20/2016 1905   BILIRUBINUR Negative 12/07/2012 1602   KETONESUR NEGATIVE 05/20/2016 1905   PROTEINUR NEGATIVE 05/20/2016 1905   NITRITE NEGATIVE 05/20/2016 1905   LEUKOCYTESUR NEGATIVE 05/20/2016 1905   LEUKOCYTESUR Negative 12/07/2012 1602    I have reviewed the labs.   Assessment & Plan:    1. Erectile dysfunction SHIM score is 5 He would like to try sildenafil 100 mg, take one tablet two hours prior to intercourse on an empty stomach, # 30; he is warned not to take medications that contain nitrates.  I also advised him of the side effects, such as: headache, flushing, dyspepsia, abnormal vision, nasal congestion, back pain, myalgia, nausea, dizziness, and rash. RTC in one months for repeat SHIM score   2. Ejaculatory disorder I had a frank discussion with the patient that as a man ages and  the prostate is enlarged is in size and/or after prostate surgery, it can be expected that the amount of semen and a forceful ejaculation can be reduced.  It is recommended that he continue to  use condoms if he is not wanting to have children though it is unlikely he can father children.  Return in about 1 month (around 10/31/2018) for SHIM and exam .  These notes generated with voice recognition software. I apologize for typographical errors.  Zara Council, PA-C  [facility] [address]  [place], [place] [address] [phone]

## 2018-09-30 ENCOUNTER — Other Ambulatory Visit: Payer: Self-pay

## 2018-09-30 ENCOUNTER — Ambulatory Visit (INDEPENDENT_AMBULATORY_CARE_PROVIDER_SITE_OTHER): Payer: Medicare Other | Admitting: Urology

## 2018-09-30 ENCOUNTER — Encounter: Payer: Self-pay | Admitting: Urology

## 2018-09-30 VITALS — BP 133/89 | HR 52 | Ht 66.0 in | Wt 170.0 lb

## 2018-09-30 DIAGNOSIS — N5201 Erectile dysfunction due to arterial insufficiency: Secondary | ICD-10-CM

## 2018-09-30 DIAGNOSIS — N5319 Other ejaculatory dysfunction: Secondary | ICD-10-CM

## 2018-09-30 MED ORDER — SILDENAFIL CITRATE 100 MG PO TABS: 100.0000 mg | ORAL_TABLET | Freq: Every day | ORAL | 3 refills | Status: DC | PRN

## 2018-10-08 ENCOUNTER — Other Ambulatory Visit: Payer: Self-pay | Admitting: Family Medicine

## 2018-10-08 DIAGNOSIS — I10 Essential (primary) hypertension: Secondary | ICD-10-CM

## 2018-10-08 DIAGNOSIS — E785 Hyperlipidemia, unspecified: Secondary | ICD-10-CM

## 2018-10-19 DIAGNOSIS — E1149 Type 2 diabetes mellitus with other diabetic neurological complication: Secondary | ICD-10-CM | POA: Diagnosis not present

## 2018-10-25 ENCOUNTER — Other Ambulatory Visit: Payer: Medicare Other

## 2018-10-25 DIAGNOSIS — E1136 Type 2 diabetes mellitus with diabetic cataract: Secondary | ICD-10-CM | POA: Diagnosis not present

## 2018-10-25 DIAGNOSIS — E039 Hypothyroidism, unspecified: Secondary | ICD-10-CM | POA: Diagnosis not present

## 2018-10-25 DIAGNOSIS — E785 Hyperlipidemia, unspecified: Secondary | ICD-10-CM | POA: Diagnosis not present

## 2018-10-25 DIAGNOSIS — I10 Essential (primary) hypertension: Secondary | ICD-10-CM | POA: Diagnosis not present

## 2018-10-25 DIAGNOSIS — E1169 Type 2 diabetes mellitus with other specified complication: Secondary | ICD-10-CM | POA: Diagnosis not present

## 2018-10-26 LAB — CBC WITH DIFFERENTIAL/PLATELET
Absolute Monocytes: 500 cells/uL (ref 200–950)
Basophils Absolute: 50 cells/uL (ref 0–200)
Basophils Relative: 1 %
Eosinophils Absolute: 130 cells/uL (ref 15–500)
Eosinophils Relative: 2.6 %
HCT: 45.3 % (ref 38.5–50.0)
Hemoglobin: 14.8 g/dL (ref 13.2–17.1)
Lymphs Abs: 2885 cells/uL (ref 850–3900)
MCH: 29.7 pg (ref 27.0–33.0)
MCHC: 32.7 g/dL (ref 32.0–36.0)
MCV: 91 fL (ref 80.0–100.0)
MPV: 10.2 fL (ref 7.5–12.5)
Monocytes Relative: 10 %
Neutro Abs: 1435 cells/uL — ABNORMAL LOW (ref 1500–7800)
Neutrophils Relative %: 28.7 %
Platelets: 208 10*3/uL (ref 140–400)
RBC: 4.98 10*6/uL (ref 4.20–5.80)
RDW: 12.2 % (ref 11.0–15.0)
Total Lymphocyte: 57.7 %
WBC: 5 10*3/uL (ref 3.8–10.8)

## 2018-10-26 LAB — COMPLETE METABOLIC PANEL WITH GFR
AG Ratio: 1.3 (calc) (ref 1.0–2.5)
ALT: 19 U/L (ref 9–46)
AST: 19 U/L (ref 10–35)
Albumin: 4.1 g/dL (ref 3.6–5.1)
Alkaline phosphatase (APISO): 54 U/L (ref 35–144)
BUN: 12 mg/dL (ref 7–25)
CO2: 32 mmol/L (ref 20–32)
Calcium: 9.2 mg/dL (ref 8.6–10.3)
Chloride: 104 mmol/L (ref 98–110)
Creat: 0.94 mg/dL (ref 0.70–1.25)
GFR, Est African American: 97 mL/min/{1.73_m2} (ref >=60)
GFR, Est Non African American: 84 mL/min/{1.73_m2} (ref >=60)
Globulin: 3.2 g/dL (calc) (ref 1.9–3.7)
Glucose, Bld: 106 mg/dL — ABNORMAL HIGH (ref 65–99)
Potassium: 3.7 mmol/L (ref 3.5–5.3)
Sodium: 142 mmol/L (ref 135–146)
Total Bilirubin: 0.8 mg/dL (ref 0.2–1.2)
Total Protein: 7.3 g/dL (ref 6.1–8.1)

## 2018-10-26 LAB — LIPID PANEL
Cholesterol: 142 mg/dL (ref <200)
HDL: 44 mg/dL (ref >=40)
LDL Cholesterol (Calc): 75 mg/dL (calc)
Non-HDL Cholesterol (Calc): 98 mg/dL (calc) (ref <130)
Total CHOL/HDL Ratio: 3.2 (calc) (ref <5.0)
Triglycerides: 151 mg/dL — ABNORMAL HIGH (ref <150)

## 2018-10-26 LAB — HEMOGLOBIN A1C
Hgb A1c MFr Bld: 6.1 % of total Hgb — ABNORMAL HIGH (ref <5.7)
Mean Plasma Glucose: 128 (calc)
eAG (mmol/L): 7.1 (calc)

## 2018-10-26 LAB — TSH: TSH: 2.53 mIU/L (ref 0.40–4.50)

## 2018-10-26 LAB — T4, FREE: Free T4: 1.2 ng/dL (ref 0.8–1.8)

## 2018-10-26 LAB — PSA: PSA: 1.9 ng/mL (ref <=4.0)

## 2018-10-27 DIAGNOSIS — E039 Hypothyroidism, unspecified: Secondary | ICD-10-CM | POA: Diagnosis not present

## 2018-11-01 ENCOUNTER — Encounter: Payer: Medicare Other | Admitting: Family Medicine

## 2018-11-02 ENCOUNTER — Encounter: Payer: Self-pay | Admitting: Family Medicine

## 2018-11-03 DIAGNOSIS — D329 Benign neoplasm of meninges, unspecified: Secondary | ICD-10-CM | POA: Diagnosis not present

## 2018-11-05 ENCOUNTER — Other Ambulatory Visit: Payer: Self-pay

## 2018-11-05 ENCOUNTER — Encounter: Payer: Self-pay | Admitting: Family Medicine

## 2018-11-05 ENCOUNTER — Ambulatory Visit (INDEPENDENT_AMBULATORY_CARE_PROVIDER_SITE_OTHER): Payer: Medicare Other | Admitting: Family Medicine

## 2018-11-05 VITALS — BP 130/69 | HR 68 | Temp 98.3°F | Resp 16 | Ht 66.0 in | Wt 170.0 lb

## 2018-11-05 DIAGNOSIS — L720 Epidermal cyst: Secondary | ICD-10-CM

## 2018-11-05 DIAGNOSIS — Z23 Encounter for immunization: Secondary | ICD-10-CM

## 2018-11-05 DIAGNOSIS — E1169 Type 2 diabetes mellitus with other specified complication: Secondary | ICD-10-CM

## 2018-11-05 DIAGNOSIS — E1136 Type 2 diabetes mellitus with diabetic cataract: Secondary | ICD-10-CM

## 2018-11-05 DIAGNOSIS — I10 Essential (primary) hypertension: Secondary | ICD-10-CM

## 2018-11-05 DIAGNOSIS — E785 Hyperlipidemia, unspecified: Secondary | ICD-10-CM

## 2018-11-05 DIAGNOSIS — E039 Hypothyroidism, unspecified: Secondary | ICD-10-CM

## 2018-11-05 DIAGNOSIS — Z0000 Encounter for general adult medical examination without abnormal findings: Secondary | ICD-10-CM

## 2018-11-05 DIAGNOSIS — G248 Other dystonia: Secondary | ICD-10-CM

## 2018-11-05 NOTE — Patient Instructions (Addendum)
Thank you for coming to the office today.  Lab results are excellent   1. Chemistry - Normal results, including electrolytes, kidney and liver function. Normal sugar.   2. Hemoglobin A1c (Diabetes) - 6.1, very well controlled.   3. PSA Prostate Cancer Screening - 1.9, negative.   4. TSH Thyroid Function Tests - Normal. Controlled on Levothyroxine 47mcg daily now.   5. Cholesterol - Normal. Controlled on Atorvastatin.   6. CBC Blood Counts - Normal, no anemia, other abnormality    Referral to General Surgery for removal of Cyst on Back.  Stay tuned for apt.   Please schedule a Follow-up Appointment to: Return in about 6 months (around 05/05/2019) for 6 month followup DM A1c.  If you have any other questions or concerns, please feel free to call the office or send a message through [place]. You may also schedule an earlier appointment if necessary.  Additionally, you may be receiving a survey about your experience at our office within a few days to 1 week by e-mail or mail. We value your feedback.  [name], DO [place]

## 2018-11-05 NOTE — Assessment & Plan Note (Signed)
Followed by [facility] Endocrinology Improved A1c to 6.1 No known hypoglycemia. Complications - DM cataracts removed, other including hyperlipidemia, GERD, hypothyroidism - increases risk of future cardiovascular complications   Plan:  1. Continue current therapy - Glipizide XL 5mg  daily 2. Encourage improved lifestyle - low carb, low sugar diet, reduce portion size, continue improving regular exercise 3. Check CBG , bring log to next visit for review 4. Continue ASA, Statin 5. Follow-up q 6 mo

## 2018-11-05 NOTE — Assessment & Plan Note (Signed)
Well-controlled HTN - Home BP readings normal  No known complications     Plan:  1. Continue current BP regimen - Metoprolol 12.5mg BID (Half of 25mg), benazepril-hctz 10-12.5mg 2. Encourage improved lifestyle - low sodium diet, regular exercise 3. Continue monitor BP outside office, bring readings to next visit, if persistently >140/90 or new symptoms notify office sooner 

## 2018-11-05 NOTE — Assessment & Plan Note (Signed)
Stable, chronic Left foot deformity - chronic problem. It is significantly affecting his gait and limiting his ambulation which appears to have affected degenerative wear on his Left knee, now s/p TKR L knee - Followed by Neurology / Ortho - he has diabetic shoes and orthotic inserts / AFO / L shoe lateral wedge  Has handicap placard

## 2018-11-05 NOTE — Assessment & Plan Note (Signed)
Controlled cholesterol on statin and lifestyle Last lipid panel 10/2018  Plan: 1. Continue current meds - Atorvastatin 20mg  daily 2. Continue ASA 81mg  for primary ASCVD risk reduction 3. Encourage improved lifestyle - low carb/cholesterol, reduce portion size, continue improving regular exercise

## 2018-11-05 NOTE — Assessment & Plan Note (Signed)
Asymptomatic, controlled Followed by [facility] Endocrinology now Last TSH elevated - now improved on higher dose levothyroxine 9mcg

## 2018-11-05 NOTE — Progress Notes (Signed)
 Subjective:    Patient ID: Ross Riley, male    DOB: 09/07/1950, 68 y.o.   MRN: [id number]  Ross Riley is a 68 y.o. male presenting on 11/05/2018 for Annual Exam   HPI  Here for Annual Physical and Lab Review  Hypothyroidism Followed now by [facility]. Last visit 09/2018, had elevated TSH and his levothyroxine was increased from 50 to 75, he is doing better now.  Diabetes, Type 2 No concerns today. Improved A1c on last lab 6.1 Meds:Glipizide XL 5mg daily Reports good compliance. Tolerating well w/o side-effects Currently on ACEi, ASA, Statin Lifestyle: - Diet (balanced diet) - Exercise (limited activity due to chronic LLE issues see note)  CHRONIC HTN / Hyperlipidemia Reports no new concerns. Current Meds - Metoprolol 12.5mg BID (Half of 25mg) , Benazepril-HCTZ 10-12.5mg daily - Taking Atorvastatin 20mg daily for HLD Reports good compliance, took meds today. Tolerating well, w/o complaints.  PMH - Chronic Left Lower ExtL ankleDystonia/ Osteoarthritis knees s/p L TKR - limited ambulation, cannot walk >200 ft w/o stopping to rest. Handicap placard.  Additional concern  Epidermoid Cyst Upper Back - chronic problem has episodic swelling and drainage at times, now it is improved, but still present, would like removal.   Health Maintenance: Due for Flu, given today  Depression screen PHQ 2/9 11/05/2018 09/20/2018 04/21/2018  Decreased Interest 0 0 0  Down, Depressed, Hopeless 0 0 0  PHQ - 2 Score 0 0 0  Altered sleeping - - -  Tired, decreased energy - - -  Change in appetite - - -  Feeling bad or failure about yourself  - - -  Trouble concentrating - - -  Moving slowly or fidgety/restless - - -  Suicidal thoughts - - -  PHQ-9 Score - - -  Difficult doing work/chores - - -    Past Medical History:  Diagnosis Date  . Arthritis   . BPH (benign prostatic hyperplasia)   . Chronic kidney disease    kidney stones  . Dystonia   . ED  (erectile dysfunction)   . GERD (gastroesophageal reflux disease)   . Glaucoma (increased eye pressure)   . Hematuria   . Hyperlipidemia   . Hypertension    Past Surgical History:  Procedure Laterality Date  . EXTRACORPOREAL SHOCK WAVE LITHOTRIPSY Right   . KIDNEY STONE SURGERY  2009  . PROSTATE SURGERY  2011  . TRANSURETHRAL RESECTION OF PROSTATE N/A 12/06/2014   Procedure: TRANSURETHRAL RESECTION OF THE PROSTATE (TURP);  Surgeon: Brian James Budzyn, MD;  Location: [facility] ORS;  Service: Urology;  Laterality: N/A;   Social History   Socioeconomic History  . Marital status: Married    Spouse name: Not on file  . Number of children: Not on file  . Years of education: Not on file  . Highest education level: 9th grade  Occupational History  . Occupation: retired  Social Needs  . Financial resource strain: Somewhat hard  . Food insecurity    Worry: Sometimes true    Inability: Sometimes true  . Transportation needs    Medical: Yes    Non-medical: Yes  Tobacco Use  . Smoking status: Former Smoker    Packs/day: 1.00    Years: 32.00    Pack years: 32.00    Types: Cigarettes    Quit date: 03/04/1991    Years since quitting: 27.6  . Smokeless tobacco: Former User  Substance and Sexual Activity  . Alcohol use: Yes    Alcohol/week: 0.0   standard drinks    Comment: occasional  . Drug use: No  . Sexual activity: Yes    Birth control/protection: None  Lifestyle  . Physical activity    Days per week: 0 days    Minutes per session: 0 min  . Stress: Not at all  Relationships  . Social Herbalist on phone: Never    Gets together: Never    Attends religious service: 1 to 4 times per year    Active member of club or organization: No    Attends meetings of clubs or organizations: Never    Relationship status: Married  . Intimate partner violence    Fear of current or ex partner: No    Emotionally abused: No    Physically abused: No    Forced sexual activity: No  Other  Topics Concern  . Not on file  Social History Narrative  . Not on file   Family History  Family history unknown: Yes   Current Outpatient Medications on File Prior to Visit  Medication Sig  . ACCU-CHEK FASTCLIX LANCETS MISC check blood sugar up to 1 time daily as directed  . Alcohol Swabs (B-D SINGLE USE SWABS REGULAR) PADS check blood sugar up to 1 time daily as directed  . aspirin EC 81 MG tablet Take by mouth.  Marland Kitchen atorvastatin (LIPITOR) 20 MG tablet TAKE 1 TABLET(20 MG) BY MOUTH DAILY  . benazepril-hydrochlorthiazide (LOTENSIN HCT) 10-12.5 MG tablet Take 1 tablet by mouth daily.  . Blood Glucose Monitoring Suppl (ACCU-CHEK AVIVA PLUS) w/Device KIT Use device to check blood sugar up to 1 time daily as directed  . brimonidine (ALPHAGAN) 0.2 % ophthalmic solution brimonidine 0.2 % eye drops  INSTILL 1 DROP INTO OU TID  . diclofenac sodium (VOLTAREN) 1 % GEL Apply 2 g topically 4 (four) times daily.  Marland Kitchen dicyclomine (BENTYL) 10 MG capsule Take 1 capsule (10 mg total) by mouth 4 (four) times daily -  before meals and at bedtime. As needed for abdominal cramping  . dorzolamide (TRUSOPT) 2 % ophthalmic solution Apply to eye.  . dorzolamide-timolol (COSOPT) 22.3-6.8 MG/ML ophthalmic solution dorzolamide 22.3 mg-timolol 6.8 mg/mL eye drops  . fluticasone (FLONASE) 50 MCG/ACT nasal spray Place 2 sprays into both nostrils daily. Use for 4-6 weeks then stop and use seasonally or as needed.  Marland Kitchen glipiZIDE (GLUCOTROL XL) 5 MG 24 hr tablet TAKE 1 TABLET(5 MG) BY MOUTH DAILY WITH BREAKFAST  . glucose blood test strip check blood sugar up to 1 time daily as directed  . latanoprost (XALATAN) 0.005 % ophthalmic solution INT 1 GTT INTO OU QHS  . levothyroxine (SYNTHROID) 75 MCG tablet Take by mouth.  . metoprolol tartrate (LOPRESSOR) 25 MG tablet TAKE 1/2 TABLET(12.5 MG) BY MOUTH TWICE DAILY  . omeprazole (PRILOSEC) 40 MG capsule TAKE 1 CAPSULE BY MOUTH DAILY BEFORE BREAKFAST 15 TO 30 MINUTES BEFORE FIRST MEAL  OF THE DAY  . sildenafil (VIAGRA) 100 MG tablet Take 1 tablet (100 mg total) by mouth daily as needed for erectile dysfunction. Take two hours prior to intercourse on an empty stomach   No current facility-administered medications on file prior to visit.     Review of Systems  Constitutional: Negative for activity change, appetite change, chills, diaphoresis, fatigue and fever.  HENT: Negative for congestion and hearing loss.   Eyes: Negative for visual disturbance.  Respiratory: Negative for cough, chest tightness, shortness of breath and wheezing.   Cardiovascular: Negative for chest pain, palpitations and  leg swelling.  Gastrointestinal: Negative for abdominal pain, constipation, diarrhea, nausea and vomiting.  Genitourinary: Negative for dysuria, frequency and hematuria.  Musculoskeletal: Negative for arthralgias and neck pain.  Skin: Negative for rash.  Neurological: Negative for dizziness, weakness, light-headedness, numbness and headaches.  Hematological: Negative for adenopathy.  Psychiatric/Behavioral: Negative for behavioral problems, dysphoric mood and sleep disturbance.   Per HPI unless specifically indicated above     Objective:    BP 130/69   Pulse 68   Temp 98.3 F (36.8 C) (Oral)   Resp 16   Ht 5' 6" (1.676 m)   Wt 170 lb (77.1 kg)   BMI 27.44 kg/m   Wt Readings from Last 3 Encounters:  11/05/18 170 lb (77.1 kg)  09/30/18 170 lb (77.1 kg)  09/20/18 170 lb (77.1 kg)    Physical Exam Vitals signs and nursing note reviewed.  Constitutional:      General: He is not in acute distress.    Appearance: He is well-developed. He is not diaphoretic.     Comments: Well-appearing, comfortable, cooperative  HENT:     Head: Normocephalic and atraumatic.  Eyes:     General:        Right eye: No discharge.        Left eye: No discharge.     Conjunctiva/sclera: Conjunctivae normal.     Pupils: Pupils are equal, round, and reactive to light.  Neck:      Musculoskeletal: Normal range of motion and neck supple.     Thyroid: No thyromegaly.  Cardiovascular:     Rate and Rhythm: Normal rate and regular rhythm.     Heart sounds: Normal heart sounds. No murmur.  Pulmonary:     Effort: Pulmonary effort is normal. No respiratory distress.     Breath sounds: Normal breath sounds. No wheezing or rales.  Abdominal:     General: Bowel sounds are normal. There is no distension.     Palpations: Abdomen is soft. There is no mass.     Tenderness: There is no abdominal tenderness.  Musculoskeletal: Normal range of motion.        General: No tenderness.     Comments: Bilateral shoes with wedge, due to focal dystonia LLE  Lymphadenopathy:     Cervical: No cervical adenopathy.  Skin:    General: Skin is warm and dry.     Findings: No erythema or rash.     Comments: Large epidermoid cyst area of induration 3 x 3 cm approx upper left back, no evidence of active drainage or secondary infection.  Neurological:     Mental Status: He is alert and oriented to person, place, and time.     Comments: Distal sensation intact to light touch all extremities  Psychiatric:        Behavior: Behavior normal.     Comments: Well groomed, good eye contact, normal speech and thoughts      L upper back cyst    Chemistry      Component Value Date/Time   NA 142 10/25/2018 0829   NA 142 06/19/2015 0908   NA 139 12/07/2012 1602   K 3.7 10/25/2018 0829   K 3.3 (L) 12/07/2012 1602   CL 104 10/25/2018 0829   CL 104 12/07/2012 1602   CO2 32 10/25/2018 0829   CO2 27 12/07/2012 1602   BUN 12 10/25/2018 0829   BUN 13 06/19/2015 0908   BUN 11 12/07/2012 1602   CREATININE 0.94 10/25/2018 0829        Component Value Date/Time   CALCIUM 9.2 10/25/2018 0829   CALCIUM 8.9 12/07/2012 1602   ALKPHOS 53 10/05/2015 1119   ALKPHOS 87 12/07/2012 1602   AST 19 10/25/2018 0829   AST 46 (H) 12/07/2012 1602   ALT 19 10/25/2018 0829   ALT 49 12/07/2012 1602   BILITOT 0.8  10/25/2018 0829   BILITOT 0.4 06/19/2015 0908   BILITOT 0.5 12/07/2012 1602     Lipid Panel     Component Value Date/Time   CHOL 142 10/25/2018 0829   CHOL 195 06/19/2015 0908   TRIG 151 (H) 10/25/2018 0829   HDL 44 10/25/2018 0829   HDL 52 06/19/2015 0908   CHOLHDL 3.2 10/25/2018 0829   VLDL 42 (H) 10/05/2015 1119   LDLCALC 75 10/25/2018 0829   Recent Labs    04/26/18 10/25/18 0829  HGBA1C 6.6 6.1*     Results for orders placed or performed in visit on 11/02/18  HM DIABETES EYE EXAM  Result Value Ref Range   HM Diabetic Eye Exam No Retinopathy No Retinopathy      Assessment & Plan:   Problem List Items Addressed This Visit    Essential hypertension    Well-controlled HTN - Home BP readings normal  No known complications    Plan:  1. Continue current BP regimen - Metoprolol 12.5mg BID (Half of 25mg), benazepril-hctz 10-12.5mg 2. Encourage improved lifestyle - low sodium diet, regular exercise 3. Continue monitor BP outside office, bring readings to next visit, if persistently >140/90 or new symptoms notify office sooner      Focal dystonia    Stable, chronic Left foot deformity - chronic problem. It is significantly affecting his gait and limiting his ambulation which appears to have affected degenerative wear on his Left knee, now s/p TKR L knee - Followed by Neurology / Ortho - he has diabetic shoes and orthotic inserts / AFO / L shoe lateral wedge  Has handicap placard       Hyperlipidemia associated with type 2 diabetes mellitus (HCC)    Controlled cholesterol on statin and lifestyle Last lipid panel 10/2018  Plan: 1. Continue current meds - Atorvastatin 20mg daily 2. Continue ASA 81mg for primary ASCVD risk reduction 3. Encourage improved lifestyle - low carb/cholesterol, reduce portion size, continue improving regular exercise      Hypothyroidism    Asymptomatic, controlled Followed by [facility] now Last TSH elevated - now improved on  higher dose levothyroxine 75mcg      Type 2 diabetes mellitus with diabetic cataract, without long-term current use of insulin (HCC)    Followed by [facility] Improved A1c to 6.1 No known hypoglycemia. Complications - DM cataracts removed, other including hyperlipidemia, GERD, hypothyroidism - increases risk of future cardiovascular complications   Plan:  1. Continue current therapy - Glipizide XL 5mg daily 2. Encourage improved lifestyle - low carb, low sugar diet, reduce portion size, continue improving regular exercise 3. Check CBG , bring log to next visit for review 4. Continue ASA, Statin 5. Follow-up q 6 mo       Other Visit Diagnoses    Annual physical exam    -  Primary   Needs flu shot       Relevant Orders   Flu Vaccine QUAD High Dose(Fluad) (Completed)   Epidermoid cyst       Relevant Orders   Ambulatory referral to General Surgery      Updated Health Maintenance information - high dose flu vaccine today Reviewed   recent lab results with patient Encouraged improvement to lifestyle with diet and exercise - Goal of weight loss   No orders of the defined types were placed in this encounter.    Follow up plan: Return in about 6 months (around 05/05/2019) for 6 month followup DM A1c.  [name], DO [facility] 11/05/2018, 2:05 PM

## 2018-11-10 ENCOUNTER — Other Ambulatory Visit: Payer: Self-pay | Admitting: Family Medicine

## 2018-11-10 DIAGNOSIS — I10 Essential (primary) hypertension: Secondary | ICD-10-CM

## 2018-11-10 NOTE — Progress Notes (Signed)
11/11/2018 3:36 PM   Ross Riley 1950-04-14 [id number]  Referring provider: Olin Hauser, DO [address] [place],  [place] [address]  Chief Complaint  Patient presents with  . Erectile Dysfunction    HPI: Ross Riley is a 68 year old male with ED who presents today to discuss further options with interpreter.    Erectile dysfunction His SHIM score is 11, which is moderate ED.   His previous SHIM score was 5.   His major complaint is lack of his ability to have sex multiple times daily.   His libido is preserved.   His risk factors for ED are age, BPH, DM, HTN, HLD and hypothyroidism.  He denies any painful erections or curvatures with his erections.   He is still having spontaneous erections.  He has tried sildenafil 20 mg and 100 mg in the past with out success.   SHIM    Row Name 11/11/18 1527         SHIM: Over the last 6 months:   How do you rate your confidence that you could get and keep an erection?  Low     When you had erections with sexual stimulation, how often were your erections hard enough for penetration (entering your partner)?  A Few Times (much less than half the time)     During sexual intercourse, how often were you able to maintain your erection after you had penetrated (entered) your partner?  A Few Times (much less than half the time)     During sexual intercourse, how difficult was it to maintain your erection to completion of intercourse?  Very Difficult     When you attempted sexual intercourse, how often was it satisfactory for you?  Sometimes (about half the time)       SHIM Total Score   SHIM  11        Score: 1-7 Severe ED 8-11 Moderate ED 12-16 Mild-Moderate ED 17-21 Mild ED 22-25 No ED   PMH: Past Medical History:  Diagnosis Date  . Arthritis   . BPH (benign prostatic hyperplasia)   . Chronic kidney disease    kidney stones  . Dystonia   . ED (erectile dysfunction)   . GERD (gastroesophageal reflux disease)   .  Glaucoma (increased eye pressure)   . Hematuria   . Hyperlipidemia   . Hypertension     Surgical History: Past Surgical History:  Procedure Laterality Date  . EXTRACORPOREAL SHOCK WAVE LITHOTRIPSY Right   . KIDNEY STONE SURGERY  2009  . PROSTATE SURGERY  2011  . TRANSURETHRAL RESECTION OF PROSTATE N/A 12/06/2014   Procedure: TRANSURETHRAL RESECTION OF THE PROSTATE (TURP);  Surgeon: Nickie Retort, MD;  Location: [facility] ORS;  Service: Urology;  Laterality: N/A;    Home Medications:  Allergies as of 11/11/2018      Reactions   Metformin And Related Other (See Comments)   Headache and dizzines   Metformin Hcl Other (See Comments)   headache and dizziness      Medication List       Accurate as of November 11, 2018  3:36 PM. If you have any questions, ask your nurse or doctor.        Accu-Chek Aviva Plus w/Device Kit Use device to check blood sugar up to 1 time daily as directed   Accu-Chek FastClix Lancets Misc check blood sugar up to 1 time daily as directed   aspirin EC 81 MG tablet Take by  mouth.   atorvastatin 20 MG tablet Commonly known as: LIPITOR TAKE 1 TABLET(20 MG) BY MOUTH DAILY   B-D SINGLE USE SWABS REGULAR Pads check blood sugar up to 1 time daily as directed   benazepril-hydrochlorthiazide 10-12.5 MG tablet Commonly known as: LOTENSIN HCT TAKE 1 TABLET BY MOUTH DAILY   brimonidine 0.2 % ophthalmic solution Commonly known as: ALPHAGAN brimonidine 0.2 % eye drops  INSTILL 1 DROP INTO OU TID   diclofenac sodium 1 % Gel Commonly known as: VOLTAREN Apply 2 g topically 4 (four) times daily.   dicyclomine 10 MG capsule Commonly known as: BENTYL Take 1 capsule (10 mg total) by mouth 4 (four) times daily -  before meals and at bedtime. As needed for abdominal cramping   dorzolamide 2 % ophthalmic solution Commonly known as: TRUSOPT Apply to eye.   dorzolamide-timolol 22.3-6.8 MG/ML ophthalmic solution Commonly known as: COSOPT dorzolamide 22.3  mg-timolol 6.8 mg/mL eye drops   fluticasone 50 MCG/ACT nasal spray Commonly known as: FLONASE Place 2 sprays into both nostrils daily. Use for 4-6 weeks then stop and use seasonally or as needed.   glipiZIDE 5 MG 24 hr tablet Commonly known as: GLUCOTROL XL TAKE 1 TABLET(5 MG) BY MOUTH DAILY WITH BREAKFAST   glucose blood test strip check blood sugar up to 1 time daily as directed   latanoprost 0.005 % ophthalmic solution Commonly known as: XALATAN INT 1 GTT INTO OU QHS   levothyroxine 75 MCG tablet Commonly known as: SYNTHROID Take by mouth.   metoprolol tartrate 25 MG tablet Commonly known as: LOPRESSOR TAKE 1/2 TABLET(12.5 MG) BY MOUTH TWICE DAILY   omeprazole 40 MG capsule Commonly known as: PRILOSEC TAKE 1 CAPSULE BY MOUTH DAILY BEFORE BREAKFAST 15 TO 30 MINUTES BEFORE FIRST MEAL OF THE DAY   sildenafil 100 MG tablet Commonly known as: VIAGRA Take 1 tablet (100 mg total) by mouth daily as needed for erectile dysfunction. Take two hours prior to intercourse on an empty stomach   tadalafil 20 MG tablet Commonly known as: CIALIS Take 1 tablet (20 mg total) by mouth daily as needed for erectile dysfunction. Started by: Zara Council, PA-C       Allergies:  Allergies  Allergen Reactions  . Metformin And Related Other (See Comments)    Headache and dizzines  . Metformin Hcl Other (See Comments)    headache and dizziness    Family History: Family History  Family history unknown: Yes    Social History:  reports that he quit smoking about 27 years ago. His smoking use included cigarettes. He has a 32.00 pack-year smoking history. He has quit using smokeless tobacco. He reports current alcohol use. He reports that he does not use drugs.  ROS: UROLOGY Frequent Urination?: No Hard to postpone urination?: No Burning/pain with urination?: No Get up at night to urinate?: No Leakage of urine?: No Urine stream starts and stops?: No Trouble starting stream?: No  Do you have to strain to urinate?: No Blood in urine?: No Urinary tract infection?: No Sexually transmitted disease?: No Injury to kidneys or bladder?: No Painful intercourse?: No Weak stream?: No Erection problems?: No Penile pain?: No  Gastrointestinal Nausea?: No Vomiting?: No Indigestion/heartburn?: No Diarrhea?: No Constipation?: No  Constitutional Fever: No Night sweats?: No Weight loss?: No Fatigue?: No  Skin Skin rash/lesions?: No Itching?: No  Eyes Blurred vision?: Yes Double vision?: No  Ears/Nose/Throat Sore throat?: No Sinus problems?: No  Hematologic/Lymphatic Swollen glands?: No Easy bruising?: No  Cardiovascular Leg  swelling?: No Chest pain?: No  Respiratory Cough?: No Shortness of breath?: No  Endocrine Excessive thirst?: No  Musculoskeletal Back pain?: No Joint pain?: No  Neurological Headaches?: No Dizziness?: No  Psychologic Depression?: No Anxiety?: No  Physical Exam: BP (!) 146/98 (BP Location: Left Arm, Patient Position: Sitting, Cuff Size: Normal)   Pulse (!) 51   Ht _0  (1.676 m)   Wt 170 lb (77.1 kg)   BMI 27.44 kg/m   Constitutional:  Well nourished. Alert and oriented, No acute distress. HEENT: [place] AT, moist mucus membranes.  Trachea midline, no masses. Cardiovascular: No clubbing, cyanosis, or edema. Respiratory: Normal respiratory effort, no increased work of breathing. Neurologic: Grossly intact, no focal deficits, moving all 4 extremities. Psychiatric: Normal mood and affect.   Laboratory Data: Lab Results  Component Value Date   WBC 5.0 10/25/2018   HGB 14.8 10/25/2018   HCT 45.3 10/25/2018   MCV 91.0 10/25/2018   PLT 208 10/25/2018    Lab Results  Component Value Date   CREATININE 0.94 10/25/2018    Lab Results  Component Value Date   PSA 1.9 10/25/2018    Lab Results  Component Value Date   TESTOSTERONE 446 09/22/2018    Lab Results  Component Value Date   HGBA1C 6.1 (H)  10/25/2018    Lab Results  Component Value Date   TSH 2.53 10/25/2018       Component Value Date/Time   CHOL 142 10/25/2018 0829   CHOL 195 06/19/2015 0908   HDL 44 10/25/2018 0829   HDL 52 06/19/2015 0908   CHOLHDL 3.2 10/25/2018 0829   VLDL 42 (H) 10/05/2015 1119   LDLCALC 75 10/25/2018 0829    Lab Results  Component Value Date   AST 19 10/25/2018   Lab Results  Component Value Date   ALT 19 10/25/2018   No components found for: ALKALINEPHOPHATASE No components found for: BILIRUBINTOTAL  No results found for: ESTRADIOL  Urinalysis    Component Value Date/Time   COLORURINE YELLOW (A) 05/20/2016 [place] (A) 05/20/2016 1905   APPEARANCEUR Clear 12/07/2012 1602   LABSPEC 1.027 05/20/2016 1905   LABSPEC 1.015 12/07/2012 1602   PHURINE 6.0 05/20/2016 1905   GLUCOSEU NEGATIVE 05/20/2016 1905   GLUCOSEU Negative 12/07/2012 1602   HGBUR SMALL (A) 05/20/2016 1905   BILIRUBINUR NEGATIVE 05/20/2016 1905   BILIRUBINUR Negative 12/07/2012 1602   KETONESUR NEGATIVE 05/20/2016 1905   PROTEINUR NEGATIVE 05/20/2016 1905   NITRITE NEGATIVE 05/20/2016 1905   LEUKOCYTESUR NEGATIVE 05/20/2016 1905   LEUKOCYTESUR Negative 12/07/2012 1602    I have reviewed the labs.   Assessment & Plan:    1. Erectile dysfunction SHIM score is 11, it is improved Not at goal with Viagra - will try Cialis 20 mg on demand dosing RTC in one month for repeat SHIM score    Return in about 1 month (around 12/11/2018) for SHIM .  These notes generated with voice recognition software. I apologize for typographical errors.  Zara Council, PA-C  [facility] [address]  [place], [place] [address] [phone]

## 2018-11-11 ENCOUNTER — Ambulatory Visit (INDEPENDENT_AMBULATORY_CARE_PROVIDER_SITE_OTHER): Payer: Medicare Other | Admitting: Urology

## 2018-11-11 ENCOUNTER — Encounter: Payer: Self-pay | Admitting: Urology

## 2018-11-11 ENCOUNTER — Other Ambulatory Visit: Payer: Self-pay

## 2018-11-11 VITALS — BP 146/98 | HR 51 | Ht 66.0 in | Wt 170.0 lb

## 2018-11-11 DIAGNOSIS — N5201 Erectile dysfunction due to arterial insufficiency: Secondary | ICD-10-CM

## 2018-11-11 MED ORDER — TADALAFIL 20 MG PO TABS: 20.0000 mg | ORAL_TABLET | Freq: Every day | ORAL | 3 refills | Status: DC | PRN

## 2018-11-17 ENCOUNTER — Other Ambulatory Visit: Payer: Self-pay | Admitting: Unknown Physician Specialty

## 2018-11-17 DIAGNOSIS — D329 Benign neoplasm of meninges, unspecified: Secondary | ICD-10-CM

## 2018-11-23 ENCOUNTER — Ambulatory Visit (INDEPENDENT_AMBULATORY_CARE_PROVIDER_SITE_OTHER): Payer: Medicare Other | Admitting: General Surgery

## 2018-11-23 ENCOUNTER — Other Ambulatory Visit: Payer: Self-pay

## 2018-11-23 ENCOUNTER — Encounter: Payer: Self-pay | Admitting: General Surgery

## 2018-11-23 DIAGNOSIS — L918 Other hypertrophic disorders of the skin: Secondary | ICD-10-CM

## 2018-11-23 NOTE — Progress Notes (Signed)
Patient ID: Ross Riley, male   DOB: Sep 05, 1950, 68 y.o.   MRN: [id number]  Chief Complaint  Patient presents with  . New Patient (Initial Visit)    back cyst    HPI Ross Riley is a 68 y.o. male.   He has been referred by his primary care provider, Dr. [name], for evaluation of epidermal inclusion cysts on his back.  Today's visit was held with the assistance of a Spanish language interpreter.  [name] states that about a year ago, he had a cyst on his back drained by a dermatologist.  He says that it has come back or did not completely go away, as he feels that it is still enlarged.  He also has a second 1 that he says he squeezes sometimes and gets a little bit of "pus and blood."  He states that neither of the lesions is painful.  He has not had any fevers or chills.   Past Medical History:  Diagnosis Date  . Arthritis   . BPH (benign prostatic hyperplasia)   . Chronic kidney disease    kidney stones  . Dystonia   . ED (erectile dysfunction)   . GERD (gastroesophageal reflux disease)   . Glaucoma (increased eye pressure)   . Hematuria   . Hemorrhoids   . Hyperlipidemia   . Hypertension   . Thyroid disease     Past Surgical History:  Procedure Laterality Date  . CATARACT EXTRACTION Bilateral   . EXTRACORPOREAL SHOCK WAVE LITHOTRIPSY Right   . JOINT REPLACEMENT Left 08/2016   left knee, [place]  2009  . PROSTATE SURGERY  2011  . TRANSURETHRAL RESECTION OF PROSTATE N/A 12/06/2014   Procedure: TRANSURETHRAL RESECTION OF THE PROSTATE (TURP);  Surgeon: Nickie Retort, MD;  Location: [facility] ORS;  Service: Urology;  Laterality: N/A;    Family History  Family history unknown: Yes    Social History Social History   Tobacco Use  . Smoking status: Former Smoker    Packs/day: 1.00    Years: 32.00    Pack years: 32.00    Types: Cigarettes    Quit date: 03/04/1991    Years since quitting: 27.7  . Smokeless tobacco: Former  Network engineer Use Topics  . Alcohol use: Yes    Alcohol/week: 0.0 standard drinks    Comment: occasional  . Drug use: No    Allergies  Allergen Reactions  . Metformin And Related Other (See Comments)    Headache and dizzines  . Metformin Hcl Other (See Comments)    headache and dizziness    Current Outpatient Medications  Medication Sig Dispense Refill  . ACCU-CHEK FASTCLIX LANCETS MISC check blood sugar up to 1 time daily as directed 102 each 3  . Alcohol Swabs (B-D SINGLE USE SWABS REGULAR) PADS check blood sugar up to 1 time daily as directed 100 each 3  . aspirin EC 81 MG tablet Take by mouth.    Marland Kitchen atorvastatin (LIPITOR) 20 MG tablet TAKE 1 TABLET(20 MG) BY MOUTH DAILY 90 tablet 1  . benazepril-hydrochlorthiazide (LOTENSIN HCT) 10-12.5 MG tablet TAKE 1 TABLET BY MOUTH DAILY 90 tablet 3  . Blood Glucose Monitoring Suppl (ACCU-CHEK AVIVA PLUS) w/Device KIT Use device to check blood sugar up to 1 time daily as directed 1 kit 0  . brimonidine (ALPHAGAN) 0.2 % ophthalmic solution brimonidine 0.2 % eye drops  INSTILL 1 DROP INTO OU TID    . diclofenac sodium (  VOLTAREN) 1 % GEL Apply 2 g topically 4 (four) times daily. 100 g 2  . dicyclomine (BENTYL) 10 MG capsule Take 1 capsule (10 mg total) by mouth 4 (four) times daily -  before meals and at bedtime. As needed for abdominal cramping 30 capsule 0  . dorzolamide (TRUSOPT) 2 % ophthalmic solution Apply to eye.    . dorzolamide-timolol (COSOPT) 22.3-6.8 MG/ML ophthalmic solution dorzolamide 22.3 mg-timolol 6.8 mg/mL eye drops    . glipiZIDE (GLUCOTROL XL) 5 MG 24 hr tablet TAKE 1 TABLET(5 MG) BY MOUTH DAILY WITH BREAKFAST 90 tablet 3  . glucose blood test strip check blood sugar up to 1 time daily as directed 100 each 3  . latanoprost (XALATAN) 0.005 % ophthalmic solution INT 1 GTT INTO OU QHS  6  . levothyroxine (SYNTHROID) 75 MCG tablet Take 50 mcg by mouth.     . metoprolol tartrate (LOPRESSOR) 25 MG tablet TAKE 1/2 TABLET(12.5 MG)  BY MOUTH TWICE DAILY 90 tablet 3  . omeprazole (PRILOSEC) 40 MG capsule TAKE 1 CAPSULE BY MOUTH DAILY BEFORE BREAKFAST 15 TO 30 MINUTES BEFORE FIRST MEAL OF THE DAY 270 capsule 1  . sildenafil (VIAGRA) 100 MG tablet Take 1 tablet (100 mg total) by mouth daily as needed for erectile dysfunction. Take two hours prior to intercourse on an empty stomach 30 tablet 3   No current facility-administered medications for this visit.     Review of Systems Review of Systems  All other systems reviewed and are negative.   Blood pressure (!) 167/86, pulse (!) 55, temperature 97.9 F (36.6 C), height '5\' 6"'$  (1.676 m), weight 169 lb (76.7 kg), SpO2 96 %.  Physical Exam Physical Exam Constitutional:      General: He is not in acute distress.    Appearance: Normal appearance. He is obese.  HENT:     Head: Normocephalic.     Nose:     Comments: Covered with a mask secondary to COVID-19 precautions    Mouth/Throat:     Comments: Covered with a mask secondary to COVID-19 precautions Eyes:     General: No scleral icterus.       Right eye: No discharge.        Left eye: No discharge.     Conjunctiva/sclera: Conjunctivae normal.  Neck:     Comments: No thyromegaly or dominant thyroid masses palpated Cardiovascular:     Rate and Rhythm: Regular rhythm. Bradycardia present.     Pulses: Normal pulses.  Pulmonary:     Effort: Pulmonary effort is normal.     Breath sounds: Normal breath sounds.  Musculoskeletal:        General: No swelling.     Comments: Scar over the left knee from prior knee surgery.  Tremor in the left lower extremity secondary to known dystonia.  Lymphadenopathy:     Cervical: No cervical adenopathy.  Skin:         Comments: Multiple skin tags and actinic keratoses.  In addition to the 2 clearly visible epidermal inclusion cysts, 2 additional lesions appear to be incipient.  Neurological:     General: No focal deficit present.     Mental Status: He is alert and oriented to  person, place, and time.  Psychiatric:        Mood and Affect: Mood normal.        Behavior: Behavior normal.     Data Reviewed I reviewed the electronic medical record.  It appears that he saw Dr.  Benitez in dermatology about a year ago, but no actual documentation is available in our electronic medical record.  Dr. Christella Scheuermann notes were also reviewed and describe lesions consistent with an epidermal inclusion cyst.  Assessment This is a 68 year old man with multiple epidermal inclusion cysts on his back.  They are not bothering him and they are not infected.  At this time, he prefers to simply observe them, rather than undergo excision.  He did complain of a couple of skin tags on his face that bother him as well as one on his arm that he seems to catch when he scratches.  He would like those removed today.  Plan The skin tags on his forehead and right forearm were removed today.  This was done after cleaning the site with chlorhexidine.  They were sharply excised with tissue scissors.  There was no pain and minimal bleeding.  Dermabond was applied and then covered with a Band-Aid once it had dried.  [name] was counseled to skip a shower tomorrow but then may shower on Thursday.  He may remove the Band-Aid at the time that he showers.  He will contact us if he desires excision of the cyst on his back.  Otherwise, we have not made any scheduled follow-up with [place].    Fredirick Maudlin 11/23/2018, 1:48 PM

## 2018-11-23 NOTE — Patient Instructions (Signed)
We have removed several skin tags in our office today.  You may shower in 48 hours, this is on 11/25/18.  Avoid Strenuous activities that will make you sweat during the next 48 hours to avoid the glue coming off prematurely. Avoid activities that will place pressure to this area of the body for 1-2 weeks to avoid re-injury to incision site.  Follow-up with our office as needed.  Please call and ask to speak with a nurse if you develop questions or concerns.

## 2018-12-06 ENCOUNTER — Other Ambulatory Visit: Payer: Self-pay

## 2018-12-06 ENCOUNTER — Ambulatory Visit
Admission: RE | Admit: 2018-12-06 | Discharge: 2018-12-06 | Disposition: A | Discharge status: Home / Self Care | Payer: Medicare Other | Source: Ambulatory Visit | Attending: Unknown Physician Specialty | Admitting: Unknown Physician Specialty

## 2018-12-06 DIAGNOSIS — D329 Benign neoplasm of meninges, unspecified: Secondary | ICD-10-CM | POA: Insufficient documentation

## 2018-12-06 MED ORDER — GADOBUTROL 1 MMOL/ML IV SOLN
7.0000 mL | Freq: Once | INTRAVENOUS | Status: AC | PRN
  Administered 2018-12-06: 16:00:00 7 mL via INTRAVENOUS

## 2018-12-07 DIAGNOSIS — R251 Tremor, unspecified: Secondary | ICD-10-CM | POA: Diagnosis not present

## 2018-12-07 DIAGNOSIS — D329 Benign neoplasm of meninges, unspecified: Secondary | ICD-10-CM | POA: Diagnosis not present

## 2018-12-14 ENCOUNTER — Ambulatory Visit: Payer: Medicare Other | Admitting: Urology

## 2019-01-08 DIAGNOSIS — S92514A Nondisplaced fracture of proximal phalanx of right lesser toe(s), initial encounter for closed fracture: Secondary | ICD-10-CM | POA: Diagnosis not present

## 2019-01-08 DIAGNOSIS — S39012A Strain of muscle, fascia and tendon of lower back, initial encounter: Secondary | ICD-10-CM | POA: Diagnosis not present

## 2019-01-08 DIAGNOSIS — S46912A Strain of unspecified muscle, fascia and tendon at shoulder and upper arm level, left arm, initial encounter: Secondary | ICD-10-CM | POA: Diagnosis not present

## 2019-01-12 DIAGNOSIS — S92504A Nondisplaced unspecified fracture of right lesser toe(s), initial encounter for closed fracture: Secondary | ICD-10-CM | POA: Diagnosis not present

## 2019-01-13 DIAGNOSIS — R251 Tremor, unspecified: Secondary | ICD-10-CM | POA: Diagnosis not present

## 2019-01-31 DIAGNOSIS — H401132 Primary open-angle glaucoma, bilateral, moderate stage: Secondary | ICD-10-CM | POA: Diagnosis not present

## 2019-02-03 DIAGNOSIS — Z719 Counseling, unspecified: Secondary | ICD-10-CM | POA: Diagnosis not present

## 2019-02-09 DIAGNOSIS — M79672 Pain in left foot: Secondary | ICD-10-CM | POA: Diagnosis not present

## 2019-02-09 DIAGNOSIS — R251 Tremor, unspecified: Secondary | ICD-10-CM | POA: Diagnosis not present

## 2019-02-09 DIAGNOSIS — R262 Difficulty in walking, not elsewhere classified: Secondary | ICD-10-CM | POA: Diagnosis not present

## 2019-03-07 DIAGNOSIS — G249 Dystonia, unspecified: Secondary | ICD-10-CM | POA: Diagnosis not present

## 2019-03-07 DIAGNOSIS — R29898 Other symptoms and signs involving the musculoskeletal system: Secondary | ICD-10-CM | POA: Diagnosis not present

## 2019-03-07 DIAGNOSIS — R2681 Unsteadiness on feet: Secondary | ICD-10-CM | POA: Diagnosis not present

## 2019-03-16 DIAGNOSIS — E538 Deficiency of other specified B group vitamins: Secondary | ICD-10-CM | POA: Diagnosis not present

## 2019-03-16 DIAGNOSIS — E611 Iron deficiency: Secondary | ICD-10-CM | POA: Diagnosis not present

## 2019-03-16 DIAGNOSIS — E119 Type 2 diabetes mellitus without complications: Secondary | ICD-10-CM | POA: Diagnosis not present

## 2019-03-16 DIAGNOSIS — E559 Vitamin D deficiency, unspecified: Secondary | ICD-10-CM | POA: Diagnosis not present

## 2019-03-16 DIAGNOSIS — G249 Dystonia, unspecified: Secondary | ICD-10-CM | POA: Diagnosis not present

## 2019-03-16 DIAGNOSIS — E039 Hypothyroidism, unspecified: Secondary | ICD-10-CM | POA: Diagnosis not present

## 2019-03-16 DIAGNOSIS — E519 Thiamine deficiency, unspecified: Secondary | ICD-10-CM | POA: Diagnosis not present

## 2019-03-24 DIAGNOSIS — E119 Type 2 diabetes mellitus without complications: Secondary | ICD-10-CM | POA: Diagnosis not present

## 2019-03-24 DIAGNOSIS — I10 Essential (primary) hypertension: Secondary | ICD-10-CM | POA: Diagnosis not present

## 2019-03-24 DIAGNOSIS — R1310 Dysphagia, unspecified: Secondary | ICD-10-CM | POA: Diagnosis not present

## 2019-03-24 DIAGNOSIS — E039 Hypothyroidism, unspecified: Secondary | ICD-10-CM | POA: Diagnosis not present

## 2019-03-30 ENCOUNTER — Encounter: Payer: Self-pay | Admitting: Physical Therapy

## 2019-03-30 ENCOUNTER — Other Ambulatory Visit: Payer: Self-pay

## 2019-03-30 ENCOUNTER — Ambulatory Visit: Payer: Medicare Other | Attending: Neurology | Admitting: Physical Therapy

## 2019-03-30 DIAGNOSIS — R269 Unspecified abnormalities of gait and mobility: Secondary | ICD-10-CM | POA: Diagnosis not present

## 2019-03-30 DIAGNOSIS — M79672 Pain in left foot: Secondary | ICD-10-CM | POA: Diagnosis not present

## 2019-03-30 DIAGNOSIS — M25672 Stiffness of left ankle, not elsewhere classified: Secondary | ICD-10-CM | POA: Diagnosis not present

## 2019-03-30 DIAGNOSIS — M6281 Muscle weakness (generalized): Secondary | ICD-10-CM

## 2019-03-30 DIAGNOSIS — G8929 Other chronic pain: Secondary | ICD-10-CM

## 2019-03-30 DIAGNOSIS — M25472 Effusion, left ankle: Secondary | ICD-10-CM

## 2019-03-31 NOTE — Therapy (Deleted)
[facility] [address]. [place], [place], [address] Phone: [phone]   Fax:  [phone]  Physical Therapy Evaluation  Patient Details  Name: Ross Riley MRN: [id number] Date of Birth: [birth year] Referring Provider (PT): Dr. Melrose Nakayama   Encounter Date: 03/30/2019  PT End of Session - 03/30/19 1720    Visit Number  1    Number of Visits  8    Date for PT Re-Evaluation  04/27/19    Authorization - Visit Number  1    Authorization - Number of Visits  10    PT Start Time  1301    PT Stop Time  1402    PT Time Calculation (min)  61 min    Activity Tolerance  Patient limited by pain    Behavior During Therapy  [facility] for tasks assessed/performed       Past Medical History:  Diagnosis Date  . Arthritis   . BPH (benign prostatic hyperplasia)   . Chronic kidney disease    kidney stones  . Dystonia   . ED (erectile dysfunction)   . GERD (gastroesophageal reflux disease)   . Glaucoma (increased eye pressure)   . Hematuria   . Hemorrhoids   . Hyperlipidemia   . Hypertension   . Thyroid disease     Past Surgical History:  Procedure Laterality Date  . CATARACT EXTRACTION Bilateral   . EXTRACORPOREAL SHOCK WAVE LITHOTRIPSY Right   . JOINT REPLACEMENT Left 08/2016   left knee, [place]  2009  . PROSTATE SURGERY  2011  . TRANSURETHRAL RESECTION OF PROSTATE N/A 12/06/2014   Procedure: TRANSURETHRAL RESECTION OF THE PROSTATE (TURP);  Surgeon: Nickie Retort, MD;  Location: [facility] ORS;  Service: Urology;  Laterality: N/A;    There were no vitals filed for this visit.   Subjective Assessment - 03/30/19 1647    Subjective  Pt. comes to clinic today with c/o difficulty walking and increased pain in L proximal phalanx of lesser toe and areas posterior and inferior to the L lateral malleolus. Pt. reports 3/10 current sharp pain, 0/10 at best. Pt reports 9/10 sharp pain 2 days ago when he is not able to WB on  his L foot. Pt. reports his L foot pain started in 1990 without MOI and can not think of any causes for the pain. Pt. reports a fall in November 2020 but states that the fall did not cause any L foot injury or increased pain in L foot. Pt. states that he is ok to proceed with evaluation without accompained by an interpreter since the interpreter has not arrived when today's session starts. Pt states walking aggravates his pain, resting help ease the symptom. Pt. states that his L ankle has tendency to roll in (inversion) during walking. Pt. reports next MD visit is March 3rd 2021 with Dr. Melrose Nakayama.  Pt. has a custom shoe on L foot to prevent supination.    Patient is accompained by:  Interpreter    Pertinent History  Pt. is a 69 yo male and is currently retired. Pts primary language is Spanish and will need an interpreter for treatment sessions. Pt. reports that his L foot pain started in 1990 without MOI and the pain is getting worse especially with walking. A fall in November 2020 without L foot injury is reported. Pt. had L TKA on 08/20/2016.    Limitations  Standing;Walking;House hold activities;Lifting    Patient Stated Goals  decrease  pain in L foot during walking    Currently in Pain?  Yes    Pain Score  3     Pain Location  Foot    Pain Orientation  Left    Pain Descriptors / Indicators  Sharp    Pain Type  Chronic pain    Pain Onset  More than a month ago    Pain Frequency  Intermittent    Aggravating Factors   walking    Pain Relieving Factors  resting         OBSERVATION:  Tremor on L side of LE   AROM:  DF                  R (3 deg.)     L (1 deg.) PF                  R (58 deg.)   L (45 deg.) IV                   R (30 deg.)    L (35 deg.) EV                 R (25 deg.)    L (15 deg.)   PROM:  DF                  R (4 deg.)    L (1 deg.) PF                  R (58 deg.)  L (45 deg.) IV                   R (28 deg.)   L (18 deg.) EV                 R (35 deg.)   L (38  deg.)   MMT:  Hip flex.           R (4/5)   L (4+/5) Hip ABD          R (5/5)   L (5/5) Hip ADD          R (5/5)   L (5/5)  Knee ext.        R (5/5)   L (5/5) Knee flex.       R (5/5)   L (5/5)  DF                  R (4+/5)   L (4+/5) PF                  15 heel raises with both heels up, limited by L anterior ankle pain IV                    R (4+/5)   L (4+/5) EV                  R (5/5)   L (4+/5)   Gait Assessment: Pt. presents with L antalgic gait with circumduction on L leg  and frequent L ankle pronation and supination during L leg swing phase.  Pt. also presents with B decreased heel strike, decreased push off phase, hip extension and flexion. Pt is limited by L foot pain after walking for 1 min and has to stop and sit down to ease the symptom.    Figure 8 test: R: 49 cm, L: 52 cm   FOTO: 19  Objective measurements completed on examination: See above findings.     Patient will benefit from skilled therapeutic intervention in order to improve the following deficits and impairments:  Abnormal gait, Decreased balance, Decreased endurance, Decreased mobility, Difficulty walking, Hypomobility, Decreased range of motion, Increased edema, Impaired tone, Improper body mechanics, Decreased activity tolerance, Decreased strength, Impaired flexibility, Pain  Visit Diagnosis: Chronic foot pain, left  Gait difficulty  Ankle joint stiffness, left  Muscle weakness (generalized)  Left ankle swelling     Problem List Patient Active Problem List   Diagnosis Date Noted  . Erectile dysfunction due to arterial insufficiency 09/14/2018  . Abnormal ejaculation 09/14/2018  . Former smoker 07/08/2017  . Hypothyroidism 07/08/2017  . Hx of adenomatous colonic polyps 06/09/2017  . History of diverticulitis 06/09/2017  . History of Clostridium difficile colitis 06/09/2017  . Recurrent colitis due to Clostridium difficile 03/25/2017  . Chills (without fever) 03/25/2017  .  Diverticulitis 12/04/2016  . Anemia 12/04/2016  . Chronic right shoulder pain 11/24/2016  . Other constipation 11/24/2016  . BPPV (benign paroxysmal positional vertigo), bilateral 10/09/2016  . Knee stiffness 10/03/2016  . S/P TKR (total knee replacement), left 09/22/2016  . Osteoarthritis of left knee 06/05/2016  . Atypical intracranial meningioma ([place]) 05/22/2016  . Abdominal pain, LLQ (left lower quadrant) 02/11/2016  . Seasonal allergies 01/24/2015  . BPH (benign prostatic hyperplasia) 12/06/2014  . Pain in joint, ankle and foot 10/26/2014  . Gonalgia 10/26/2014  . Osteoarthritis of multiple joints 10/26/2014  . Bilateral cataracts 10/26/2014  . ED (erectile dysfunction) of organic origin 10/26/2014  . Acid reflux 10/26/2014  . Hyperlipidemia associated with type 2 diabetes mellitus ([place]) 10/26/2014  . Essential hypertension 10/26/2014  . Type 2 diabetes mellitus with diabetic cataract, without long-term current use of insulin ([place]) 08/02/2014  . Focal dystonia 07/22/2012    Pura Spice 03/31/2019, 5:32 PM  [facility] [address] [place], [place], [address] Phone: [phone]   Fax:  [phone]  Name: Ross Riley MRN: [id number] Date of Birth: [birth year]

## 2019-03-31 NOTE — Therapy (Signed)
[facility] [address] [place], [place], [address] Phone: [phone]   Fax:  [phone]  Physical Therapy Evaluation  Patient Details  Name: Ross Riley MRN: [id number] Date of Birth: [birth year] Referring Provider (PT): Dr. Melrose Nakayama   Encounter Date: 03/30/2019  PT End of Session - 03/30/19 1720    Visit Number  1    Number of Visits  8    Date for PT Re-Evaluation  04/27/19    Authorization - Visit Number  1    Authorization - Number of Visits  10    PT Start Time  1301    PT Stop Time  1402    PT Time Calculation (min)  61 min    Activity Tolerance  Patient limited by pain    Behavior During Therapy  [facility] for tasks assessed/performed       Past Medical History:  Diagnosis Date  . Arthritis   . BPH (benign prostatic hyperplasia)   . Chronic kidney disease    kidney stones  . Dystonia   . ED (erectile dysfunction)   . GERD (gastroesophageal reflux disease)   . Glaucoma (increased eye pressure)   . Hematuria   . Hemorrhoids   . Hyperlipidemia   . Hypertension   . Thyroid disease     Past Surgical History:  Procedure Laterality Date  . CATARACT EXTRACTION Bilateral   . EXTRACORPOREAL SHOCK WAVE LITHOTRIPSY Right   . JOINT REPLACEMENT Left 08/2016   left knee, [place]  2009  . PROSTATE SURGERY  2011  . TRANSURETHRAL RESECTION OF PROSTATE N/A 12/06/2014   Procedure: TRANSURETHRAL RESECTION OF THE PROSTATE (TURP);  Surgeon: Nickie Retort, MD;  Location: [facility] ORS;  Service: Urology;  Laterality: N/A;    There were no vitals filed for this visit.   Subjective Assessment - 03/30/19 1647    Subjective  Pt. comes to clinic today with c/o difficulty walking and increased pain in L proximal phalanx of lesser toe and areas posterior and inferior to the L lateral malleolus. Pt. reports 3/10 current sharp pain, 0/10 at best. Pt reports 9/10 sharp pain 2 days ago when he is not able to WB on  his L foot. Pt. reports his L foot pain started in 1990 without MOI and can not think of any causes for the pain. Pt. reports a fall in November 2020 but states that the fall did not cause any L foot injury or increased pain in L foot. Pt. states that he is ok to proceed with evaluation without accompained by an interpreter since the interpreter has not arrived when today's session starts. Pt states walking aggravates his pain, resting help ease the symptom. Pt. states that his L ankle has tendency to roll in (inversion) during walking. Pt. reports next MD visit is March 3rd 2021 with Dr. Melrose Nakayama.  Pt. has a custom shoe on L foot to prevent supination.    Patient is accompained by:  Interpreter    Pertinent History  Pt. is a 69 yo male and is currently retired. Pts primary language is Spanish and will need an interpreter for treatment sessions. Pt. reports that his L foot pain started in 1990 without MOI and the pain is getting worse especially with walking. A fall in November 2020 without L foot injury is reported. Pt. had L TKA on 08/20/2016.    Limitations  Standing;Walking;House hold activities;Lifting    Patient Stated Goals  decrease  pain in L foot during walking    Currently in Pain?  Yes    Pain Score  3     Pain Location  Foot    Pain Orientation  Left    Pain Descriptors / Indicators  Sharp    Pain Type  Chronic pain    Pain Onset  More than a month ago    Pain Frequency  Intermittent    Aggravating Factors   walking    Pain Relieving Factors  resting       OBSERVATION:  Tremor on L side of LE   AROM:  DF                  R (3 deg.)     L (1 deg.) PF                  R (58 deg.)   L (45 deg.) IV                   R (30 deg.)    L (35 deg.) EV                 R (25 deg.)    L (15 deg.)   PROM:  DF                  R (4 deg.)    L (1 deg.) PF                  R (58 deg.)  L (45 deg.) IV                   R (28 deg.)   L (18 deg.) EV                 R (35 deg.)   L (38  deg.)   MMT:  Hip flex.           R (4/5)   L (4+/5) Hip ABD          R (5/5)   L (5/5) Hip ADD          R (5/5)   L (5/5)  Knee ext.        R (5/5)   L (5/5) Knee flex.       R (5/5)   L (5/5)  DF                  R (4+/5)   L (4+/5) PF                  15 heel raises with both heels up, limited by L anterior ankle pain IV                    R (4+/5)   L (4+/5) EV                  R (5/5)   L (4+/5)   Gait Assessment: Pt. presents with L antalgic gait with circumduction on L leg  and frequent L ankle pronation and supination during L leg swing phase.  Pt. also presents with B decreased heel strike, decreased push off phase, hip extension and flexion. Pt is limited by L foot pain after walking for 1 min and has to stop and sit down to ease the symptom.    Figure 8 test: R: 49 cm, L: 52 cm   FOTO: 19  Objective measurements completed on examination: See above findings.       PT Long Term Goals - 03/31/19 1135      PT LONG TERM GOAL #1   Title  Pt. will be able to ambulate with use of a SPC for >7 min with no reports of increased pain in L lateral ankle.    Baseline  Pt. can not walk for more than 1 min without a cane due to increased pain in his L foot.    Time  4    Period  Weeks    Status  New    Target Date  04/27/19      PT LONG TERM GOAL #2   Title  Pt. will score 48 in FOTO to indicate increase LE mobility function    Baseline  initial score: 19 on 03/30/2019    Time  4    Period  Weeks    Status  New    Target Date  04/27/19      PT LONG TERM GOAL #3   Title  Pt. independent with HEP to increase bilateral PF strength to 5/5 MMT to improve push off phase during gait and increase ambulation efficiency.    Baseline  Pt. is able to complete 15 heels raise with both heels up but is limited by L anterior ankle pain    Time  4    Period  Weeks    Status  New    Target Date  04/27/19      PT LONG TERM GOAL #4   Title  Pt. will be able to increase B DF  AROM to 10 deg to improve heel strike and decrease fall risks during gait.    Baseline  AROM DF (R: 3 deg, L: 1 deg.)    Time  4    Period  Weeks    Status  New    Target Date  04/27/19             Plan - 03/31/19 0801    Clinical Impression Statement  Pt. is a 69 y/o male c/o L foot pain in proximal phalanx of lesser toe and areas inferior and posterior to the L lateral malleolus. Pt. demonstrates overall good strength (5/5 MMT) in B LE muscles in MMT except in DF (R & L: 4+/5), PF (15 heel raises with both heels up and limited by L anterior ankle pain), EV (R 5/5, L 4+/5), IN (R & L: 4+/5) and hip flexion (R 4/5, L  4+/5). Pt demonstrates good overall ankle AROM except DF (R: 3 deg. L: 1 deg).  Pt presents with L ankle swelling (figure 8 test: R 49 cm, L 52 cm). FOTO: initial 19/ goal 48.  Pt. presents with L antalgic gait with L leg circumduction and frequent pronation and supination during L leg swing phase. Pt. also presents with decreased B heel strike, hip flexion, hip extension and decreased stride length, more ER in R > L leg, decreased B push off phase and minimal trunk rotation during gait. Pt. has to stop walking after amubulating for 1 min due to L foot pain. Pt. does not report pain during any testing and measurements during today's session except during gait assessment and PF MMT. Pt. will benefit from skilled PT services to increase L ankle AROM and decrease swelling and pain to improve gait independence with reduced pain.    Examination-Activity Limitations  Bed Mobility;Lift;Squat;Stairs;Toileting;Stand;Dressing;Bathing    Stability/Clinical Decision Making  Evolving/Moderate complexity  Clinical Decision Making  Moderate    Rehab Potential  Fair    PT Frequency  2x / week    PT Duration  4 weeks    PT Treatment/Interventions  ADLs/Self Care Home Management;Cryotherapy;Software engineer;Therapeutic activities;Stair training;Therapeutic  exercise;Balance training;Neuromuscular re-education;Patient/family education;Manual techniques;Passive range of motion;Energy conservation;Orthotic Fit/Training    PT Next Visit Plan  Assess single leg heel raise, assess gait without shoes on, assess berg balance and stand on airex. Disscuss about compression socks/slevees. Calf stretches, gait training with cane.    Consulted and Agree with Plan of Care  Patient       Patient will benefit from skilled therapeutic intervention in order to improve the following deficits and impairments:  Abnormal gait, Decreased balance, Decreased endurance, Decreased mobility, Difficulty walking, Hypomobility, Decreased range of motion, Increased edema, Impaired tone, Improper body mechanics, Decreased activity tolerance, Decreased strength, Impaired flexibility, Pain  Visit Diagnosis: Chronic foot pain, left  Gait difficulty  Ankle joint stiffness, left  Muscle weakness (generalized)  Left ankle swelling     Problem List Patient Active Problem List   Diagnosis Date Noted  . Erectile dysfunction due to arterial insufficiency 09/14/2018  . Abnormal ejaculation 09/14/2018  . Former smoker 07/08/2017  . Hypothyroidism 07/08/2017  . Hx of adenomatous colonic polyps 06/09/2017  . History of diverticulitis 06/09/2017  . History of Clostridium difficile colitis 06/09/2017  . Recurrent colitis due to Clostridium difficile 03/25/2017  . Chills (without fever) 03/25/2017  . Diverticulitis 12/04/2016  . Anemia 12/04/2016  . Chronic right shoulder pain 11/24/2016  . Other constipation 11/24/2016  . BPPV (benign paroxysmal positional vertigo), bilateral 10/09/2016  . Knee stiffness 10/03/2016  . S/P TKR (total knee replacement), left 09/22/2016  . Osteoarthritis of left knee 06/05/2016  . Atypical intracranial meningioma ([place]) 05/22/2016  . Abdominal pain, LLQ (left lower quadrant) 02/11/2016  . Seasonal allergies 01/24/2015  . BPH (benign prostatic  hyperplasia) 12/06/2014  . Pain in joint, ankle and foot 10/26/2014  . Gonalgia 10/26/2014  . Osteoarthritis of multiple joints 10/26/2014  . Bilateral cataracts 10/26/2014  . ED (erectile dysfunction) of organic origin 10/26/2014  . Acid reflux 10/26/2014  . Hyperlipidemia associated with type 2 diabetes mellitus ([place]) 10/26/2014  . Essential hypertension 10/26/2014  . Type 2 diabetes mellitus with diabetic cataract, without long-term current use of insulin ([place]) 08/02/2014  . Focal dystonia 07/22/2012   Pura Spice, PT, DPT # [place], SPT 03/31/2019, 5:33 PM  [facility] [address] [place], [place], [address] Phone: [phone]   Fax:  [phone]  Name: Ross Riley MRN: [id number] Date of Birth: [birth year]

## 2019-04-01 ENCOUNTER — Encounter: Payer: Self-pay | Admitting: Physical Therapy

## 2019-04-01 ENCOUNTER — Other Ambulatory Visit: Payer: Self-pay

## 2019-04-01 ENCOUNTER — Ambulatory Visit: Payer: Medicare Other | Admitting: Physical Therapy

## 2019-04-01 DIAGNOSIS — M25672 Stiffness of left ankle, not elsewhere classified: Secondary | ICD-10-CM

## 2019-04-01 DIAGNOSIS — M25472 Effusion, left ankle: Secondary | ICD-10-CM

## 2019-04-01 DIAGNOSIS — M6281 Muscle weakness (generalized): Secondary | ICD-10-CM

## 2019-04-01 DIAGNOSIS — M79672 Pain in left foot: Secondary | ICD-10-CM | POA: Diagnosis not present

## 2019-04-01 DIAGNOSIS — G8929 Other chronic pain: Secondary | ICD-10-CM

## 2019-04-01 DIAGNOSIS — R269 Unspecified abnormalities of gait and mobility: Secondary | ICD-10-CM

## 2019-04-01 NOTE — Patient Instructions (Signed)
Access Code: [email or web address]  URL: [email or web address]  Date: 04/01/2019  Prepared by: Dorcas Carrow   Exercises  Long Sitting Calf Stretch with Strap - 3 reps - 1 sets - 1 minute hold - 1x daily - 7x weekly  Seated Hip Abduction with Resistance - 20 reps - 1 sets - 1x daily - 7x weekly  Seated Ankle Eversion with Resistance - 20 reps - 1 sets - 1x daily - 7x weekly  Standing Heel Raises - 20 reps - 1 sets - 1x daily - 7x weekly

## 2019-04-01 NOTE — Therapy (Signed)
[facility] [address] [place], [place], [address] Phone: [phone]   Fax:  [phone]  Physical Therapy Treatment  Patient Details  Name: Ross Riley MRN: [id number] Date of Birth: 01-29-1951 Referring Provider (PT): Dr. Melrose Nakayama   Encounter Date: 04/01/2019  PT End of Session - 04/01/19 1638    Visit Number  2    Number of Visits  8    Date for PT Re-Evaluation  04/27/19    Authorization - Visit Number  2    Authorization - Number of Visits  10    PT Start Time  1302    PT Stop Time  1403    PT Time Calculation (min)  61 min    Equipment Utilized During Treatment  Gait belt    Activity Tolerance  Patient tolerated treatment well    Behavior During Therapy  [facility] for tasks assessed/performed       Past Medical History:  Diagnosis Date  . Arthritis   . BPH (benign prostatic hyperplasia)   . Chronic kidney disease    kidney stones  . Dystonia   . ED (erectile dysfunction)   . GERD (gastroesophageal reflux disease)   . Glaucoma (increased eye pressure)   . Hematuria   . Hemorrhoids   . Hyperlipidemia   . Hypertension   . Thyroid disease     Past Surgical History:  Procedure Laterality Date  . CATARACT EXTRACTION Bilateral   . EXTRACORPOREAL SHOCK WAVE LITHOTRIPSY Right   . JOINT REPLACEMENT Left 08/2016   left knee, [place]  2009  . PROSTATE SURGERY  2011  . TRANSURETHRAL RESECTION OF PROSTATE N/A 12/06/2014   Procedure: TRANSURETHRAL RESECTION OF THE PROSTATE (TURP);  Surgeon: Nickie Retort, MD;  Location: [facility] ORS;  Service: Urology;  Laterality: N/A;    There were no vitals filed for this visit.  Subjective Assessment - 04/01/19 1632    Subjective  Pt. reports 4/10 L foot pain prior to today's session. Pt. reports that his L custom shoe was made 2 yrs ago and it has been helping him to stabilize his L ankle during walking. Pt. states that he currently is not taking any  medication for pain. Pt. reports less pain when walking with his cane.    Patient is accompained by:  Interpreter    Pertinent History  Pt. is a 69 yo male and is currently retired. Pts primary language is Spanish and will need an interpreter for treatment sessions. Pt. reports that his L foot pain started in 1990 without MOI and the pain is getting worse especially with walking. A fall in November 2020 without L foot injury is reported. Pt. had L TKA on 08/20/2016.    Limitations  House hold activities;Lifting;Standing;Walking    Patient Stated Goals  decrease pain in L foot during walking    Currently in Pain?  Yes    Pain Score  4     Pain Location  Foot    Pain Orientation  Left    Pain Type  Chronic pain    Pain Onset  More than a month ago    Pain Frequency  Intermittent    Aggravating Factors   walking    Pain Relieving Factors  resting    Multiple Pain Sites  No      Therex: Nustep L 1 10 min See handout (HEP)  Assessment: DF: ankle DF ROM stays the same with knee straight  or knee bent Hamstring: decreased B hamstring flexibility  Passive accessory motion: joint stiffness in AP (grade I-IV) at B talocrural joints Normal standing/Single leg stance: excessive L ankle supination compared to R ankle, tremor on L foot noted. Gait assessment without shoes: excessive supination noted during forward/backward/side walking within //-bars x 3 laps with SBA.   Manual Therapy: Joint mobs with passive movement: B talocrural joint (grade III-IV) 30 s x 2 sets each side with passive DF  Supine, pt asked to push his feet towards SPT (PF) isometicly for 5 s and then relax, SPT pushes ankle into DF and stays for 3 s, 6 reps x 1 set each side, verbal cueing for proper techniques. Supine manual hamstring stretch 30 x 1 set  each side, verbal cueing to relax muscles.      PT Education - 04/01/19 1637    Education Details  See HEP    Person(s) Educated  Patient    Methods   Explanation;Demonstration;Handout    Comprehension  Verbalized understanding;Returned demonstration          PT Long Term Goals - 03/31/19 1135      PT LONG TERM GOAL #1   Title  Pt. will be able to ambulate with use of a SPC for >7 min with no reports of increased pain in L lateral ankle.    Baseline  Pt. can not walk for more than 1 min without a cane due to increased pain in his L foot.    Time  4    Period  Weeks    Status  New    Target Date  04/27/19      PT LONG TERM GOAL #2   Title  Pt. will score 48 in FOTO to indicate increase LE mobility function    Baseline  initial score: 19 on 03/30/2019    Time  4    Period  Weeks    Status  New    Target Date  04/27/19      PT LONG TERM GOAL #3   Title  Pt. independent with HEP to increase bilateral PF strength to 5/5 MMT to improve push off phase during gait and increase ambulation efficiency.    Baseline  Pt. is able to complete 15 heels raise with both heels up but is limited by L anterior ankle pain    Time  4    Period  Weeks    Status  New    Target Date  04/27/19      PT LONG TERM GOAL #4   Title  Pt. will be able to increase B DF AROM to 10 deg to improve heel strike and decrease fall risks during gait.    Baseline  AROM DF (R: 3 deg, L: 1 deg.)    Time  4    Period  Weeks    Status  New    Target Date  04/27/19            Plan - 04/01/19 1659    Clinical Impression Statement  Pt. enters clinic with a cane and is able to walk for further distance without increased pain compared to the last session. Pt. tolerates today's session well without increased L foot pain. Pt. demonstrates no increase in B DF ROM during supine SLR with knee straight and with knee bent. Pt. demonstrates tightness in B hamstring muscle during supine manual SLR and joint tightness in B talorural joints during AP (grade III-IV) without increased pain. Pt.  presents with excessive supination and tremor in L ankle during bare foot walking with  and without a cane and standing/single leg standing without a cane. Pt. will benefit from skilled PT services to increase B ankle AROM and strength to increase gait independence and efficiency.    Examination-Activity Limitations  Bed Mobility;Lift;Squat;Stairs;Toileting;Stand;Dressing;Bathing    Stability/Clinical Decision Making  Evolving/Moderate complexity    Clinical Decision Making  Moderate    Rehab Potential  Fair    PT Frequency  2x / week    PT Duration  4 weeks    PT Treatment/Interventions  ADLs/Self Care Home Management;Cryotherapy;Software engineer;Therapeutic activities;Stair training;Therapeutic exercise;Balance training;Neuromuscular re-education;Patient/family education;Manual techniques;Passive range of motion;Energy conservation;Orthotic Fit/Training    PT Next Visit Plan  Assess berg banlance test. MET, joint mobs, gait/balance.  Discuss compression socks/slevees.    PT Home Exercise Plan  Access Code: [email or web address]    Consulted and Agree with Plan of Care  Patient       Patient will benefit from skilled therapeutic intervention in order to improve the following deficits and impairments:  Abnormal gait, Decreased balance, Decreased endurance, Decreased mobility, Difficulty walking, Hypomobility, Decreased range of motion, Increased edema, Impaired tone, Improper body mechanics, Decreased activity tolerance, Decreased strength, Impaired flexibility, Pain  Visit Diagnosis: Chronic foot pain, left  Gait difficulty  Ankle joint stiffness, left  Muscle weakness (generalized)  Left ankle swelling     Problem List Patient Active Problem List   Diagnosis Date Noted  . Erectile dysfunction due to arterial insufficiency 09/14/2018  . Abnormal ejaculation 09/14/2018  . Former smoker 07/08/2017  . Hypothyroidism 07/08/2017  . Hx of adenomatous colonic polyps 06/09/2017  . History of diverticulitis 06/09/2017  . History of Clostridium difficile  colitis 06/09/2017  . Recurrent colitis due to Clostridium difficile 03/25/2017  . Chills (without fever) 03/25/2017  . Diverticulitis 12/04/2016  . Anemia 12/04/2016  . Chronic right shoulder pain 11/24/2016  . Other constipation 11/24/2016  . BPPV (benign paroxysmal positional vertigo), bilateral 10/09/2016  . Knee stiffness 10/03/2016  . S/P TKR (total knee replacement), left 09/22/2016  . Osteoarthritis of left knee 06/05/2016  . Atypical intracranial meningioma ([place]) 05/22/2016  . Abdominal pain, LLQ (left lower quadrant) 02/11/2016  . Seasonal allergies 01/24/2015  . BPH (benign prostatic hyperplasia) 12/06/2014  . Pain in joint, ankle and foot 10/26/2014  . Gonalgia 10/26/2014  . Osteoarthritis of multiple joints 10/26/2014  . Bilateral cataracts 10/26/2014  . ED (erectile dysfunction) of organic origin 10/26/2014  . Acid reflux 10/26/2014  . Hyperlipidemia associated with type 2 diabetes mellitus ([place]) 10/26/2014  . Essential hypertension 10/26/2014  . Type 2 diabetes mellitus with diabetic cataract, without long-term current use of insulin ([place]) 08/02/2014  . Focal dystonia 07/22/2012   Pura Spice, PT, DPT # [place], SPT 04/01/2019, 6:38 PM  [facility] [address] [place], [place], [address] Phone: [phone]   Fax:  [phone]  Name: WILBERTH DAMON MRN: [id number] Date of Birth: 01/27/1951

## 2019-04-04 ENCOUNTER — Ambulatory Visit: Payer: Medicare Other | Admitting: Physical Therapy

## 2019-04-05 ENCOUNTER — Other Ambulatory Visit: Payer: Self-pay

## 2019-04-05 ENCOUNTER — Ambulatory Visit: Payer: Medicare Other | Attending: Neurology | Admitting: Physical Therapy

## 2019-04-05 ENCOUNTER — Encounter: Payer: Self-pay | Admitting: Physical Therapy

## 2019-04-05 DIAGNOSIS — M25672 Stiffness of left ankle, not elsewhere classified: Secondary | ICD-10-CM

## 2019-04-05 DIAGNOSIS — G8929 Other chronic pain: Secondary | ICD-10-CM | POA: Insufficient documentation

## 2019-04-05 DIAGNOSIS — M79672 Pain in left foot: Secondary | ICD-10-CM | POA: Diagnosis not present

## 2019-04-05 DIAGNOSIS — M25472 Effusion, left ankle: Secondary | ICD-10-CM

## 2019-04-05 DIAGNOSIS — M6281 Muscle weakness (generalized): Secondary | ICD-10-CM | POA: Diagnosis not present

## 2019-04-05 DIAGNOSIS — R269 Unspecified abnormalities of gait and mobility: Secondary | ICD-10-CM

## 2019-04-05 NOTE — Therapy (Signed)
[facility] [address] [place], [place], [address] Phone: [phone]   Fax:  [phone]  Physical Therapy Treatment  Patient Details  Name: JERI RAWLINS MRN: [id number] Date of Birth: 1950-04-30 Referring Provider (PT): Dr. Melrose Nakayama   Encounter Date: 04/05/2019  PT End of Session - 04/05/19 1423    Visit Number  3    Number of Visits  8    Date for PT Re-Evaluation  04/27/19    Authorization - Visit Number  3    Authorization - Number of Visits  10    PT Start Time  0349    PT Stop Time  1411    PT Time Calculation (min)  72 min    Activity Tolerance  Patient tolerated treatment well;Patient limited by pain    Behavior During Therapy  [facility] for tasks assessed/performed       Past Medical History:  Diagnosis Date  . Arthritis   . BPH (benign prostatic hyperplasia)   . Chronic kidney disease    kidney stones  . Dystonia   . ED (erectile dysfunction)   . GERD (gastroesophageal reflux disease)   . Glaucoma (increased eye pressure)   . Hematuria   . Hemorrhoids   . Hyperlipidemia   . Hypertension   . Thyroid disease     Past Surgical History:  Procedure Laterality Date  . CATARACT EXTRACTION Bilateral   . EXTRACORPOREAL SHOCK WAVE LITHOTRIPSY Right   . JOINT REPLACEMENT Left 08/2016   left knee, [place]  2009  . PROSTATE SURGERY  2011  . TRANSURETHRAL RESECTION OF PROSTATE N/A 12/06/2014   Procedure: TRANSURETHRAL RESECTION OF THE PROSTATE (TURP);  Surgeon: Nickie Retort, MD;  Location: [facility] ORS;  Service: Urology;  Laterality: N/A;    There were no vitals filed for this visit.  Subjective Assessment - 04/05/19 1438    Subjective  Pt. reports no L foot pain at the beginning of the session. Pt. states that he is comfortable to attend PT sessions without accompained by an interpreter. Pt. reports increased pain to 4/10 in his L anterior ankle when doing calf stretching with a band on the  floor at home.    Patient is accompained by:  Interpreter    Pertinent History  Pt. is a 69 yo male and is currently retired. Pts primary language is Spanish and will need an interpreter for treatment sessions. Pt. reports that his L foot pain started in 1990 without MOI and the pain is getting worse especially with walking. A fall in November 2020 without L foot injury is reported. Pt. had L TKA on 08/20/2016.    Limitations  House hold activities;Lifting;Standing;Walking    Patient Stated Goals  decrease pain in L foot during walking    Currently in Pain?  No/denies    Pain Location  Foot    Pain Orientation  Left    Pain Onset  More than a month ago        Manual Therapy: Joint mobs with passive movement: B talocrural joint (grade III-IV) 2 min each side with passive DF  Supine MET, PF and hold for 5 s then SPT pushes into DF, 10 reps x 1 set each side, verbal cueing for using 20% of strength. Supine manual hamstring/gluts stretch 30 s x 1 set each side Supine hamstring MET, isometric contraction for 5 s then SPT pushes into hip flex 8 reps x 1 set each side,  limited by pain in hamstring  Therex: 6 min walk test in hallway, stops at 3:20 at 272 ft due to pain in L foot. HR before the test: 85 BPM. HR after the test: 88 BPM. Stair negotiation two hands/one hand/no hands 3 laps each, verbal cueing for proper techniques  Neuromuscular re-education: Single leg stances 30 s x 2 sets on each leg with a fixed bar by his right side, CGA Tandem stance 30 s x 1 set on each leg with a fixed bar by his right side, CGA    PT Long Term Goals - 03/31/19 1135      PT LONG TERM GOAL #1   Title  Pt. will be able to ambulate with use of a SPC for >7 min with no reports of increased pain in L lateral ankle.    Baseline  Pt. can not walk for more than 1 min without a cane due to increased pain in his L foot.    Time  4    Period  Weeks    Status  New    Target Date  04/27/19      PT LONG TERM GOAL  #2   Title  Pt. will score 48 in FOTO to indicate increase LE mobility function    Baseline  initial score: 19 on 03/30/2019    Time  4    Period  Weeks    Status  New    Target Date  04/27/19      PT LONG TERM GOAL #3   Title  Pt. independent with HEP to increase bilateral PF strength to 5/5 MMT to improve push off phase during gait and increase ambulation efficiency.    Baseline  Pt. is able to complete 15 heels raise with both heels up but is limited by L anterior ankle pain    Time  4    Period  Weeks    Status  New    Target Date  04/27/19      PT LONG TERM GOAL #4   Title  Pt. will be able to increase B DF AROM to 10 deg to improve heel strike and decrease fall risks during gait.    Baseline  AROM DF (R: 3 deg, L: 1 deg.)    Time  4    Period  Weeks    Status  New    Target Date  04/27/19            Plan - 04/05/19 1444    Clinical Impression Statement  Pt. has to stop at 3:20 at 270 ft during 6 minute walk test in hallway limited by L foot pain (initial HR:85, end HR: 88). Pt. demonstrates no improvement in DF ROM bilaterally after MET,  AP mobs with passive DF and manual calf stretches. Pts B hamstring flexibility is limited and SLR ROM during MET and manual stretching is limited by hamstring tightness and reported pain. Pt. is able to perform stair negotiation with step through patterns without UE assist and without increased pain, but presents with compensation movement by turning his body to L when going down stairs. Pt demonstrates good overall static balance in narrowed BOS during single leg stance R > L and tandem stance. Pt. requires one finger touch for balance during single leg stance on L leg and no UE assist for tandem stance.    Examination-Activity Limitations  Bed Mobility;Lift;Squat;Stairs;Toileting;Stand;Dressing;Bathing    Stability/Clinical Decision Making  Evolving/Moderate complexity    Clinical Decision Making  Moderate    Rehab Potential  Fair    PT  Frequency  2x / week    PT Duration  4 weeks    PT Treatment/Interventions  ADLs/Self Care Home Management;Cryotherapy;Software engineer;Therapeutic activities;Stair training;Therapeutic exercise;Balance training;Neuromuscular re-education;Patient/family education;Manual techniques;Passive range of motion;Energy conservation;Orthotic Fit/Training    PT Next Visit Plan  assess dynamic balance test, TUG test, figure 8. continues MET, joint mobs, increase walking duration with reduced pain.    PT Home Exercise Plan  Access Code: [email or web address]    Consulted and Agree with Plan of Care  Patient       Patient will benefit from skilled therapeutic intervention in order to improve the following deficits and impairments:  Abnormal gait, Decreased balance, Decreased endurance, Decreased mobility, Difficulty walking, Hypomobility, Decreased range of motion, Increased edema, Impaired tone, Improper body mechanics, Decreased activity tolerance, Decreased strength, Impaired flexibility, Pain  Visit Diagnosis: Chronic foot pain, left  Gait difficulty  Ankle joint stiffness, left  Muscle weakness (generalized)  Left ankle swelling     Problem List Patient Active Problem List   Diagnosis Date Noted  . Erectile dysfunction due to arterial insufficiency 09/14/2018  . Abnormal ejaculation 09/14/2018  . Former smoker 07/08/2017  . Hypothyroidism 07/08/2017  . Hx of adenomatous colonic polyps 06/09/2017  . History of diverticulitis 06/09/2017  . History of Clostridium difficile colitis 06/09/2017  . Recurrent colitis due to Clostridium difficile 03/25/2017  . Chills (without fever) 03/25/2017  . Diverticulitis 12/04/2016  . Anemia 12/04/2016  . Chronic right shoulder pain 11/24/2016  . Other constipation 11/24/2016  . BPPV (benign paroxysmal positional vertigo), bilateral 10/09/2016  . Knee stiffness 10/03/2016  . S/P TKR (total knee replacement), left 09/22/2016  .  Osteoarthritis of left knee 06/05/2016  . Atypical intracranial meningioma ([place]) 05/22/2016  . Abdominal pain, LLQ (left lower quadrant) 02/11/2016  . Seasonal allergies 01/24/2015  . BPH (benign prostatic hyperplasia) 12/06/2014  . Pain in joint, ankle and foot 10/26/2014  . Gonalgia 10/26/2014  . Osteoarthritis of multiple joints 10/26/2014  . Bilateral cataracts 10/26/2014  . ED (erectile dysfunction) of organic origin 10/26/2014  . Acid reflux 10/26/2014  . Hyperlipidemia associated with type 2 diabetes mellitus ([place]) 10/26/2014  . Essential hypertension 10/26/2014  . Type 2 diabetes mellitus with diabetic cataract, without long-term current use of insulin ([place]) 08/02/2014  . Focal dystonia 07/22/2012   Pura Spice, PT, DPT # [place], SPT 04/06/2019, 10:48 AM  [facility] [address] [place], [place], [address] Phone: [phone]   Fax:  [phone]  Name: SAYAN ALDAVA MRN: [id number] Date of Birth: November 12, 1950

## 2019-04-06 ENCOUNTER — Encounter: Payer: Medicare Other | Admitting: Physical Therapy

## 2019-04-07 ENCOUNTER — Other Ambulatory Visit: Payer: Self-pay

## 2019-04-07 ENCOUNTER — Ambulatory Visit: Payer: Medicare Other | Admitting: Physical Therapy

## 2019-04-07 DIAGNOSIS — M25472 Effusion, left ankle: Secondary | ICD-10-CM

## 2019-04-07 DIAGNOSIS — G8929 Other chronic pain: Secondary | ICD-10-CM | POA: Diagnosis not present

## 2019-04-07 DIAGNOSIS — M79672 Pain in left foot: Secondary | ICD-10-CM

## 2019-04-07 DIAGNOSIS — M25672 Stiffness of left ankle, not elsewhere classified: Secondary | ICD-10-CM | POA: Diagnosis not present

## 2019-04-07 DIAGNOSIS — M6281 Muscle weakness (generalized): Secondary | ICD-10-CM

## 2019-04-07 DIAGNOSIS — R269 Unspecified abnormalities of gait and mobility: Secondary | ICD-10-CM | POA: Diagnosis not present

## 2019-04-08 ENCOUNTER — Encounter: Payer: Self-pay | Admitting: Physical Therapy

## 2019-04-08 ENCOUNTER — Ambulatory Visit (INDEPENDENT_AMBULATORY_CARE_PROVIDER_SITE_OTHER): Payer: Medicare Other | Admitting: Family Medicine

## 2019-04-08 ENCOUNTER — Other Ambulatory Visit: Payer: Self-pay

## 2019-04-08 ENCOUNTER — Other Ambulatory Visit: Payer: Self-pay | Admitting: Family Medicine

## 2019-04-08 ENCOUNTER — Encounter: Payer: Self-pay | Admitting: Family Medicine

## 2019-04-08 VITALS — BP 129/71 | HR 62 | Temp 97.9°F | Ht 66.0 in | Wt 172.2 lb

## 2019-04-08 DIAGNOSIS — I10 Essential (primary) hypertension: Secondary | ICD-10-CM | POA: Diagnosis not present

## 2019-04-08 DIAGNOSIS — E1136 Type 2 diabetes mellitus with diabetic cataract: Secondary | ICD-10-CM

## 2019-04-08 DIAGNOSIS — S39012A Strain of muscle, fascia and tendon of lower back, initial encounter: Secondary | ICD-10-CM

## 2019-04-08 DIAGNOSIS — E785 Hyperlipidemia, unspecified: Secondary | ICD-10-CM

## 2019-04-08 LAB — POCT GLYCOSYLATED HEMOGLOBIN (HGB A1C): Hemoglobin A1C: 6.4 % — AB (ref 4.0–5.6)

## 2019-04-08 MED ORDER — BACLOFEN 10 MG PO TABS: 5.0000 mg | ORAL_TABLET | Freq: Three times a day (TID) | ORAL | 1 refills | Status: DC | PRN

## 2019-04-08 NOTE — Assessment & Plan Note (Signed)
Well-controlled HTN - Home BP readings normal  No known complications     Plan:  1. Continue current BP regimen - Metoprolol 12.5mg BID (Half of 25mg), benazepril-hctz 10-12.5mg 2. Encourage improved lifestyle - low sodium diet, regular exercise 3. Continue monitor BP outside office, bring readings to next visit, if persistently >140/90 or new symptoms notify office sooner 

## 2019-04-08 NOTE — Assessment & Plan Note (Signed)
Followed by [facility] Controlled A1c 6.4 today No known hypoglycemia. Complications - DM cataracts removed, other including hyperlipidemia, GERD, hypothyroidism - increases risk of future cardiovascular complications   Plan:  1. Continue current therapy - Glipizide XL 5mg  daily - caution hypoglycemia 2. Encourage improved lifestyle - low carb, low sugar diet, reduce portion size, continue improving regular exercise 3. Check CBG , bring log to next visit for review 4. Continue ASA, Statin - DM Foot exam

## 2019-04-08 NOTE — Therapy (Signed)
[facility] [address] [place], [place], [address] Phone: [phone]   Fax:  [phone]  Physical Therapy Treatment  Patient Details  Name: KELYN KOSKELA MRN: [id number] Date of Birth: 03-Jan-1951 Referring Provider (PT): Dr. Melrose Nakayama   Encounter Date: 04/07/2019  PT End of Session - 04/07/19 1356    Visit Number  4    Number of Visits  8    Date for PT Re-Evaluation  04/27/19    Authorization - Visit Number  4    Authorization - Number of Visits  10    PT Start Time  1300    PT Stop Time  2563    PT Time Calculation (min)  54 min    Activity Tolerance  Patient tolerated treatment well;Patient limited by pain    Behavior During Therapy  [facility] for tasks assessed/performed       Past Medical History:  Diagnosis Date  . Arthritis   . BPH (benign prostatic hyperplasia)   . Chronic kidney disease    kidney stones  . Dystonia   . ED (erectile dysfunction)   . GERD (gastroesophageal reflux disease)   . Glaucoma (increased eye pressure)   . Hematuria   . Hemorrhoids   . Hyperlipidemia   . Hypertension   . Thyroid disease     Past Surgical History:  Procedure Laterality Date  . CATARACT EXTRACTION Bilateral   . EXTRACORPOREAL SHOCK WAVE LITHOTRIPSY Right   . JOINT REPLACEMENT Left 08/2016   left knee, [place]  2009  . PROSTATE SURGERY  2011  . TRANSURETHRAL RESECTION OF PROSTATE N/A 12/06/2014   Procedure: TRANSURETHRAL RESECTION OF THE PROSTATE (TURP);  Surgeon: Nickie Retort, MD;  Location: [facility] ORS;  Service: Urology;  Laterality: N/A;    There were no vitals filed for this visit.  Subjective Assessment - 04/08/19 0835    Subjective  Pt. reports 3/10 L ankle pain prior to today's session. Pt. states he has 10/10 L foot pain the day after the last session on 04/06/2019 when lying on bed watching TV, the pain lasts for 3-4 hours and then the symptoms ease.  Pt. can't think about anything he  has done differently that caused 10/10 L foot pain. Pt. states that his pain usually comes and goes for no reason.    Pertinent History  Pt. is a 69 yo male and is currently retired. Pts primary language is Spanish and will need an interpreter for treatment sessions. Pt. reports that his L foot pain started in 1990 without MOI and the pain is getting worse especially with walking. A fall in November 2020 without L foot injury is reported. Pt. had L TKA on 08/20/2016.    Limitations  House hold activities;Lifting;Standing;Walking    Patient Stated Goals  decrease pain in L foot during walking    Currently in Pain?  Yes    Pain Score  3     Pain Location  Foot    Pain Orientation  Left    Pain Descriptors / Indicators  Sharp    Pain Type  Chronic pain    Pain Onset  More than a month ago    Pain Frequency  Intermittent    Multiple Pain Sites  No         Manual Therapy: Joint mobs with passive movement: B talocrural joint (grade III-IV) 2 min each side with passive DF  Supine MET, PF and hold for  5 s then SPT pushes into DF, 10 reps x 1 set each side, verbal cueing for using 20% of strength. Supine lateral subtalar mobs 1 min on each side Ankle eversion against manual resistance 5 s hold x 10 reps on each side DF against manual resistance 10 reps on each side, increased pain in L anterior ankle  Therex: (<6 min.) Nustep UE/LE 10 min L4 Stair negotiation two hands/one hand/no hands 3 laps each, verbal cueing for proper techniques Ankle eversion with RTB 10 reps x 2 sets on each side  Neuromuscular re-education: Standing weight shift in forward/backward with staggered stance, side to side 10 reps on each direction Ambulation in hallway 270 ft x 2 sets with 3 min rest break between the sets        PT Long Term Goals - 03/31/19 1135      PT LONG TERM GOAL #1   Title  Pt. will be able to ambulate with use of a SPC for >7 min with no reports of increased pain in L lateral ankle.     Baseline  Pt. can not walk for more than 1 min without a cane due to increased pain in his L foot.    Time  4    Period  Weeks    Status  New    Target Date  04/27/19      PT LONG TERM GOAL #2   Title  Pt. will score 48 in FOTO to indicate increase LE mobility function    Baseline  initial score: 19 on 03/30/2019    Time  4    Period  Weeks    Status  New    Target Date  04/27/19      PT LONG TERM GOAL #3   Title  Pt. independent with HEP to increase bilateral PF strength to 5/5 MMT to improve push off phase during gait and increase ambulation efficiency.    Baseline  Pt. is able to complete 15 heels raise with both heels up but is limited by L anterior ankle pain    Time  4    Period  Weeks    Status  New    Target Date  04/27/19      PT LONG TERM GOAL #4   Title  Pt. will be able to increase B DF AROM to 10 deg to improve heel strike and decrease fall risks during gait.    Baseline  AROM DF (R: 3 deg, L: 1 deg.)    Time  4    Period  Weeks    Status  New    Target Date  04/27/19            Plan - 04/07/19 0943    Clinical Impression Statement  Pt. presents with limited PROM in subtalar joint R and L in eversion during supine subtalar joint mobs. Pt. has mild increase in L anterior ankle pain during talocrural joint AP mobs (grade III-IV) with passive DF and seated active DF with manual resistance at the end range of DF. Pt. has less confidence in his L foot compared to R during standing weight shifting training with increased pain in L foot. Pt. demonstrates good balance during standing weight shifting training in multi directions without LOB and UE assist.    Examination-Activity Limitations  Bed Mobility;Lift;Squat;Stairs;Toileting;Stand;Dressing;Bathing    Stability/Clinical Decision Making  Evolving/Moderate complexity    Clinical Decision Making  Moderate    Rehab Potential  Fair  PT Frequency  2x / week    PT Duration  4 weeks    PT Treatment/Interventions   ADLs/Self Care Home Management;Cryotherapy;Software engineer;Therapeutic activities;Stair training;Therapeutic exercise;Balance training;Neuromuscular re-education;Patient/family education;Manual techniques;Passive range of motion;Energy conservation;Orthotic Fit/Training    PT Next Visit Plan  assess dynamic balance test, TUG test, figure 8. continues MET, joint mobs, increase walking duration with reduced pain.    PT Home Exercise Plan  Access Code: [email or web address]    Consulted and Agree with Plan of Care  Patient       Patient will benefit from skilled therapeutic intervention in order to improve the following deficits and impairments:  Abnormal gait, Decreased balance, Decreased endurance, Decreased mobility, Difficulty walking, Hypomobility, Decreased range of motion, Increased edema, Impaired tone, Improper body mechanics, Decreased activity tolerance, Decreased strength, Impaired flexibility, Pain  Visit Diagnosis: Chronic foot pain, left  Gait difficulty  Ankle joint stiffness, left  Muscle weakness (generalized)  Left ankle swelling     Problem List Patient Active Problem List   Diagnosis Date Noted  . Erectile dysfunction due to arterial insufficiency 09/14/2018  . Abnormal ejaculation 09/14/2018  . Former smoker 07/08/2017  . Hypothyroidism 07/08/2017  . Hx of adenomatous colonic polyps 06/09/2017  . History of diverticulitis 06/09/2017  . History of Clostridium difficile colitis 06/09/2017  . Recurrent colitis due to Clostridium difficile 03/25/2017  . Chills (without fever) 03/25/2017  . Diverticulitis 12/04/2016  . Anemia 12/04/2016  . Chronic right shoulder pain 11/24/2016  . Other constipation 11/24/2016  . BPPV (benign paroxysmal positional vertigo), bilateral 10/09/2016  . Knee stiffness 10/03/2016  . S/P TKR (total knee replacement), left 09/22/2016  . Osteoarthritis of left knee 06/05/2016  . Atypical intracranial meningioma ([place])  05/22/2016  . Abdominal pain, LLQ (left lower quadrant) 02/11/2016  . Seasonal allergies 01/24/2015  . BPH (benign prostatic hyperplasia) 12/06/2014  . Pain in joint, ankle and foot 10/26/2014  . Gonalgia 10/26/2014  . Osteoarthritis of multiple joints 10/26/2014  . Bilateral cataracts 10/26/2014  . ED (erectile dysfunction) of organic origin 10/26/2014  . Acid reflux 10/26/2014  . Hyperlipidemia associated with type 2 diabetes mellitus ([place]) 10/26/2014  . Essential hypertension 10/26/2014  . Type 2 diabetes mellitus with diabetic cataract, without long-term current use of insulin ([place]) 08/02/2014  . Focal dystonia 07/22/2012   Pura Spice, PT, DPT # [place], SPT 04/08/2019, 1:53 PM  [facility] [address] [place], [place], [address] Phone: [phone]   Fax:  [phone]  Name: KINGSLY KLOEPFER MRN: [id number] Date of Birth: [birth year]

## 2019-04-08 NOTE — Patient Instructions (Addendum)
Thank you for coming to the office today.  Recent Labs    04/26/18 0000 10/25/18 0829 04/08/19 1404  HGBA1C 6.6 6.1* 6.4*     [place] COVID19 Vaccine Information  LOCATION:  [facility]) [place] [address]  Hours: Monday - Sunday 8:00am to 12:00pm  COVID-19 Vaccines By Appointment Only  Sign up for [place] List  [email or web address] or text "VACCINE" to [phone] or call [phone]  -----------------  For general COVID 19 questions, call [phone].  The COVID-19 Vaccine Appointment Call Center is CLOSED. The Call Center will open February 8 at 8:30am to fill appointments for February 9. Call [phone]. Healthcare Workers & Adults 75 and Older   DUE for [place] (no food or drink after midnight before the lab appointment, only water or coffee without cream/sugar on the morning of)  SCHEDULE "Lab Only" visit in the morning at the clinic for lab draw in 7 MONTHS   - Make sure Lab Only appointment is at about 1 week before your next appointment, so that results will be available  For Lab Results, once available within 2-3 days of blood draw, you can can log in to MyChart online to view your results and a brief explanation. Also, we can discuss results at next follow-up visit.    Please schedule a Follow-up Appointment to: Return in about 7 months (around 11/06/2019) for Annual Physical.  If you have any other questions or concerns, please feel free to call the office or send a message through [place]. You may also schedule an earlier appointment if necessary.  Additionally, you may be receiving a survey about your experience at our office within a few days to 1 week by e-mail or mail. We value your feedback.  [name], DO [place]

## 2019-04-08 NOTE — Progress Notes (Signed)
Subjective:    Patient ID: [name], male    DOB: Apr 03, 1950, 69 y.o.   MRN: [id number]  Ross Riley is a 69 y.o. male presenting on 04/08/2019 for Diabetes   HPI   Diabetes, Type 2 No concerns today. A1c today is 6.4. Denies hypoglycemia. Checking CBG regularly. Meds:Glipizide XL 5mg  daily Reports good compliance. Tolerating well w/o side-effects Currently on ACEi, ASA, Statin Lifestyle: - Diet (balanced diet) - Exercise (limited activity due to chronic LLE issues see note)  CHRONIC HTN Reportsno new concerns. Current Meds -Metoprolol 12.5mg  BID (Half of 25mg ) , Benazepril-HCTZ 10-12.5mg  daily Reports good compliance, took meds today. Tolerating well, w/o complaints.  Right Back Pain Reports onset few weeks ago, seems persistent without resolution.  Tried topical diclofenac gel without relief. Worse if bending or twisting or deep breathing.  PMH -Chronic Left Lower ExtL ankleDystonia/ Osteoarthritis knees s/p L TKR- limited ambulation, cannot walk >200 ft w/o stopping to rest, uses cane and handicap placard  Health Maintenance: Interested in age 69+ covid vaccine info  Depression screen [facility] 2/9 11/05/2018 09/20/2018 04/21/2018  Decreased Interest 0 0 0  Down, Depressed, Hopeless 0 0 0  PHQ - 2 Score 0 0 0  Altered sleeping - - -  Tired, decreased energy - - -  Change in appetite - - -  Feeling bad or failure about yourself  - - -  Trouble concentrating - - -  Moving slowly or fidgety/restless - - -  Suicidal thoughts - - -  PHQ-9 Score - - -  Difficult doing work/chores - - -    Social History   Tobacco Use  . Smoking status: Former Smoker    Packs/day: 1.00    Years: 32.00    Pack years: 32.00    Types: Cigarettes    Quit date: 03/04/1991    Years since quitting: 28.1  . Smokeless tobacco: Former Network engineer Use Topics  . Alcohol use: Yes    Alcohol/week: 0.0 standard drinks    Comment: occasional  . Drug use: No    Review of  Systems Per HPI unless specifically indicated above     Objective:    BP 129/71   Pulse 62   Temp 97.9 F (36.6 C) (Oral)   Ht 5\' 6"  (1.676 m)   Wt 172 lb 3.2 oz (78.1 kg)   BMI 27.79 kg/m   Wt Readings from Last 3 Encounters:  04/08/19 172 lb 3.2 oz (78.1 kg)  11/23/18 169 lb (76.7 kg)  11/11/18 170 lb (77.1 kg)    Physical Exam Vitals and nursing note reviewed.  Constitutional:      General: He is not in acute distress.    Appearance: He is well-developed. He is not diaphoretic.     Comments: Well-appearing, comfortable, cooperative  HENT:     Head: Normocephalic and atraumatic.  Eyes:     General:        Right eye: No discharge.        Left eye: No discharge.     Conjunctiva/sclera: Conjunctivae normal.  Cardiovascular:     Rate and Rhythm: Normal rate.  Pulmonary:     Effort: Pulmonary effort is normal.  Musculoskeletal:     Comments: Using cane for ambulation  Skin:    General: Skin is warm and dry.     Findings: No erythema or rash.  Neurological:     Mental Status: He is alert and oriented to person, place, and time.  Comments: L foot ankle dystonia  Psychiatric:        Behavior: Behavior normal.     Comments: Well groomed, good eye contact, normal speech and thoughts      Diabetic Foot Exam - Simple   Simple Foot Form Diabetic Foot exam was performed with the following findings: Yes 04/08/2019  2:09 PM  Visual Inspection See comments: Yes Sensation Testing Intact to touch and monofilament testing bilaterally: Yes Pulse Check Posterior Tibialis and Dorsalis pulse intact bilaterally: Yes Comments Left foot with dystonia loss of muscle tone.      Recent Labs    04/26/18 0000 10/25/18 0829 04/08/19 1404  HGBA1C 6.6 6.1* 6.4*     Results for orders placed or performed in visit on 04/08/19  POCT glycosylated hemoglobin (Hb A1C)  Result Value Ref Range   Hemoglobin A1C 6.4 (A) 4.0 - 5.6 %      Assessment & Plan:   Problem List Items  Addressed This Visit    Type 2 diabetes mellitus with diabetic cataract, without long-term current use of insulin ([place]) - Primary    Followed by [facility] Controlled A1c 6.4 today No known hypoglycemia. Complications - DM cataracts removed, other including hyperlipidemia, GERD, hypothyroidism - increases risk of future cardiovascular complications   Plan:  1. Continue current therapy - Glipizide XL 5mg  daily - caution hypoglycemia 2. Encourage improved lifestyle - low carb, low sugar diet, reduce portion size, continue improving regular exercise 3. Check CBG , bring log to next visit for review 4. Continue ASA, Statin - DM Foot exam      Relevant Orders   POCT glycosylated hemoglobin (Hb A1C) (Completed)   Essential hypertension    Well-controlled HTN - Home BP readings normal  No known complications     Plan:  1. Continue current BP regimen - Metoprolol 12.5mg  BID (Half of 25mg ), benazepril-hctz 10-12.5mg  2. Encourage improved lifestyle - low sodium diet, regular exercise 3. Continue monitor BP outside office, bring readings to next visit, if persistently >140/90 or new symptoms notify office sooner       Other Visit Diagnoses    Back strain, initial encounter       Relevant Medications   baclofen (LIORESAL) 10 MG tablet      Acute R LBP without sciatica. Suspect likely due to muscle spasm/strain, without known injury or trauma.  - No red flag symptoms. Negative SLR for radiculopathy - Inadequate conservative therapy   Plan: 1. Start muscle relaxant with Baclofen 10mg  tabs - take 5-10mg  up to TID PRN, titrate up as tolerated 2. May use Tylenol PRN for breakthrough 3. Encouraged use of heating pad 1-2x daily for now then PRN   Meds ordered this encounter  Medications  . baclofen (LIORESAL) 10 MG tablet    Sig: Take 0.5-1 tablets (5-10 mg total) by mouth 3 (three) times daily as needed for muscle spasms. Prefer only at bedtime, take additional only as needed     Dispense:  30 each    Refill:  1      Follow up plan: Return in about 7 months (around 11/06/2019) for Annual Physical.   Ross Putnam, DO [facility] 04/08/2019, 1:55 PM

## 2019-04-11 ENCOUNTER — Encounter: Payer: Medicare Other | Admitting: Physical Therapy

## 2019-04-11 ENCOUNTER — Telehealth: Payer: Self-pay | Admitting: Family Medicine

## 2019-04-11 NOTE — Telephone Encounter (Signed)
I called and spoke with the patient and he confirmed that he needs a order for a left wedge. I also called [facility] and they confirmed that all they need is a order stating evaluate and treat for a orthotic insert L shoe wedge. The order can be faxed to [phone]. (No notes are needed, because the wedge is out-of-pocket cost.)

## 2019-04-11 NOTE — Telephone Encounter (Signed)
Pt. daughter call said that pt need a new prescription shoe rework for [employer] in [place]

## 2019-04-11 NOTE — Telephone Encounter (Signed)
Patient was seen on 04/08/19.  I checked his chart for previous Diabetic Shoe orders.  We ordered shoes from [facility] back in 07/2018 we did order for the custom shoe wedge and in 09/2018 we had to re order his diabetic shoes.  I am not quite sure what he needs now, there are different forms for each shoe order.  I would need to know which specific rx to write and we can either fax them a handwritten rx and they can send [place] back the order form / paperwork, or they can send [place] the paperwork first.  If you can check with patient's family to see which one he needs - new diabetic shoes OR the shoe wedge (or BOTH)  I printed a copy of one of the last shoe orders from [place], [place] 04/11/2019, 3:33 PM

## 2019-04-11 NOTE — Telephone Encounter (Signed)
Handwritten order ready to be faxed.  [name], [facility] 04/11/2019, 4:04 PM

## 2019-04-12 ENCOUNTER — Encounter: Payer: Self-pay | Admitting: Physical Therapy

## 2019-04-12 ENCOUNTER — Other Ambulatory Visit: Payer: Self-pay

## 2019-04-12 ENCOUNTER — Ambulatory Visit: Payer: Medicare Other | Admitting: Physical Therapy

## 2019-04-12 DIAGNOSIS — M25672 Stiffness of left ankle, not elsewhere classified: Secondary | ICD-10-CM | POA: Diagnosis not present

## 2019-04-12 DIAGNOSIS — G8929 Other chronic pain: Secondary | ICD-10-CM

## 2019-04-12 DIAGNOSIS — M6281 Muscle weakness (generalized): Secondary | ICD-10-CM | POA: Diagnosis not present

## 2019-04-12 DIAGNOSIS — M25472 Effusion, left ankle: Secondary | ICD-10-CM | POA: Diagnosis not present

## 2019-04-12 DIAGNOSIS — M79672 Pain in left foot: Secondary | ICD-10-CM | POA: Diagnosis not present

## 2019-04-12 DIAGNOSIS — R269 Unspecified abnormalities of gait and mobility: Secondary | ICD-10-CM

## 2019-04-12 NOTE — Therapy (Signed)
[facility] [address]. [place], [place], [address] Phone: [phone]   Fax:  [phone]  Physical Therapy Treatment  Patient Details  Name: Ross Riley MRN: [id number] Date of Birth: Jun 26, 1950 Referring Provider (PT): Dr. Melrose Nakayama   Encounter Date: 04/12/2019  PT End of Session - 04/12/19 1405    Visit Number  5    Number of Visits  8    Date for PT Re-Evaluation  04/27/19    Authorization - Visit Number  5    Authorization - Number of Visits  10    PT Start Time  1300    PT Stop Time  [id number]    PT Time Calculation (min)  48 min    Equipment Utilized During Treatment  Gait belt    Activity Tolerance  Patient tolerated treatment well;Patient limited by pain    Behavior During Therapy  [facility] for tasks assessed/performed       Past Medical History:  Diagnosis Date  . Arthritis   . BPH (benign prostatic hyperplasia)   . Chronic kidney disease    kidney stones  . Dystonia   . ED (erectile dysfunction)   . GERD (gastroesophageal reflux disease)   . Glaucoma (increased eye pressure)   . Hematuria   . Hemorrhoids   . Hyperlipidemia   . Hypertension   . Thyroid disease     Past Surgical History:  Procedure Laterality Date  . CATARACT EXTRACTION Bilateral   . EXTRACORPOREAL SHOCK WAVE LITHOTRIPSY Right   . JOINT REPLACEMENT Left 08/2016   left knee, [place]  2009  . PROSTATE SURGERY  2011  . TRANSURETHRAL RESECTION OF PROSTATE N/A 12/06/2014   Procedure: TRANSURETHRAL RESECTION OF THE PROSTATE (TURP);  Surgeon: Nickie Retort, MD;  Location: [facility] ORS;  Service: Urology;  Laterality: N/A;    There were no vitals filed for this visit.  Subjective Assessment - 04/12/19 1404    Subjective  Pt. reports increased L foot pain when waking up this morning and 5/10 pain prior to today's session.    Pertinent History  Pt. is a 69 yo male and is currently retired. Pts primary language is Spanish and  will need an interpreter for treatment sessions. Pt. reports that his L foot pain started in 1990 without MOI and the pain is getting worse especially with walking. A fall in November 2020 without L foot injury is reported. Pt. had L TKA on 08/20/2016.    Limitations  House hold activities;Lifting;Standing;Walking    Patient Stated Goals  decrease pain in L foot during walking    Currently in Pain?  Yes    Pain Score  5     Pain Location  Foot    Pain Orientation  Left    Pain Descriptors / Indicators  Sharp    Pain Type  Chronic pain    Pain Onset  More than a month ago    Pain Frequency  Intermittent    Multiple Pain Sites  No       Therex: Nustep L4 10 min Lateral walk with RTB on ankle 3 laps hold on handle of a treadmill x 2 sets Lateral walk with GTB and BTB 4 laps x 2 sets with hands holding on handle of a treadmill x 2 sets Seated hip abduction with GTB and BTB 10 reps x 2 sets with 5 s hold, verbal cueing for isometric holds Standing calf stretches 30 s x  1 set on each side, verbal cueing for proper techniques Standing calf stretches with a blue tool, hands holding on treadmill handle for maintaining balance, 5 reps x 10 s hold, limited by L ankle pain  Neuromuscular re-education: Standing weight shifting side to side/forward to backward 10 reps each side Standing weight shifting side to side with feet on airex within //-bars, LOB at one time but is able to use UE assist to hold on bars to maintain balance, CGA, verbal cueing for proper techniques Tandem stance 20 s each side, with at least one finger holding bars within //-bars, SBA/CGA (Marching on airex with 5 s hold 5 reps, tandem walk on foam, with at least one finger holding bars) x 4 laps, SBA/CGA     PT Long Term Goals - 03/31/19 1135      PT LONG TERM GOAL #1   Title  Pt. will be able to ambulate with use of a SPC for >7 min with no reports of increased pain in L lateral ankle.    Baseline  Pt. can not walk for  more than 1 min without a cane due to increased pain in his L foot.    Time  4    Period  Weeks    Status  New    Target Date  04/27/19      PT LONG TERM GOAL #2   Title  Pt. will score 48 in FOTO to indicate increase LE mobility function    Baseline  initial score: 19 on 03/30/2019    Time  4    Period  Weeks    Status  New    Target Date  04/27/19      PT LONG TERM GOAL #3   Title  Pt. independent with HEP to increase bilateral PF strength to 5/5 MMT to improve push off phase during gait and increase ambulation efficiency.    Baseline  Pt. is able to complete 15 heels raise with both heels up but is limited by L anterior ankle pain    Time  4    Period  Weeks    Status  New    Target Date  04/27/19      PT LONG TERM GOAL #4   Title  Pt. will be able to increase B DF AROM to 10 deg to improve heel strike and decrease fall risks during gait.    Baseline  AROM DF (R: 3 deg, L: 1 deg.)    Time  4    Period  Weeks    Status  New    Target Date  04/27/19            Plan - 04/12/19 1422    Clinical Impression Statement  Pt. enters clinic with increased L antalgic gait due to increased pain in L ankle. Pt. requires at least one finger holds on bars during SLS and tandem walk on foam within //-bars to maintain balance. Pt. has increased L anterior and lateral ankle pain during standing calf stretches on prostretch and is limited by pain. Pt. feels gluts activation during standing BTB and GTB lateral walk. Pt. tolerates other therex well without increased pain in L ankle.    Examination-Activity Limitations  Bed Mobility;Lift;Squat;Stairs;Toileting;Stand;Dressing;Bathing    Stability/Clinical Decision Making  Evolving/Moderate complexity    Clinical Decision Making  Moderate    Rehab Potential  Fair    PT Frequency  2x / week    PT Duration  4 weeks  PT Treatment/Interventions  ADLs/Self Care Home Management;Cryotherapy;Software engineer;Therapeutic  activities;Stair training;Therapeutic exercise;Balance training;Neuromuscular re-education;Patient/family education;Manual techniques;Passive range of motion;Energy conservation;Orthotic Fit/Training    PT Next Visit Plan  assess dynamic balance test, TUG test, figure 8. stability therex, test walking limit without increased pain. ASK LBP MD VISIT THIS WEEK.    PT Home Exercise Plan  Access Code: [email or web address]    Consulted and Agree with Plan of Care  Patient       Patient will benefit from skilled therapeutic intervention in order to improve the following deficits and impairments:  Abnormal gait, Decreased balance, Decreased endurance, Decreased mobility, Difficulty walking, Hypomobility, Decreased range of motion, Increased edema, Impaired tone, Improper body mechanics, Decreased activity tolerance, Decreased strength, Impaired flexibility, Pain  Visit Diagnosis: Chronic foot pain, left  Gait difficulty  Ankle joint stiffness, left  Muscle weakness (generalized)  Left ankle swelling     Problem List Patient Active Problem List   Diagnosis Date Noted  . Erectile dysfunction due to arterial insufficiency 09/14/2018  . Abnormal ejaculation 09/14/2018  . Former smoker 07/08/2017  . Hypothyroidism 07/08/2017  . Hx of adenomatous colonic polyps 06/09/2017  . History of diverticulitis 06/09/2017  . History of Clostridium difficile colitis 06/09/2017  . Recurrent colitis due to Clostridium difficile 03/25/2017  . Chills (without fever) 03/25/2017  . Diverticulitis 12/04/2016  . Anemia 12/04/2016  . Chronic right shoulder pain 11/24/2016  . Other constipation 11/24/2016  . BPPV (benign paroxysmal positional vertigo), bilateral 10/09/2016  . Knee stiffness 10/03/2016  . S/P TKR (total knee replacement), left 09/22/2016  . Osteoarthritis of left knee 06/05/2016  . Atypical intracranial meningioma ([place]) 05/22/2016  . Abdominal pain, LLQ (left lower quadrant) 02/11/2016  . Seasonal  allergies 01/24/2015  . BPH (benign prostatic hyperplasia) 12/06/2014  . Pain in joint, ankle and foot 10/26/2014  . Gonalgia 10/26/2014  . Osteoarthritis of multiple joints 10/26/2014  . Bilateral cataracts 10/26/2014  . ED (erectile dysfunction) of organic origin 10/26/2014  . Acid reflux 10/26/2014  . Hyperlipidemia associated with type 2 diabetes mellitus ([place]) 10/26/2014  . Essential hypertension 10/26/2014  . Type 2 diabetes mellitus with diabetic cataract, without long-term current use of insulin ([place]) 08/02/2014  . Focal dystonia 07/22/2012   Pura Spice, PT, DPT # [place], SPT 04/13/2019, 2:17 PM  [facility] [address] [place], [place], [address] Phone: [phone]   Fax:  [phone]  Name: PLEASANT DOBIN MRN: [id number] Date of Birth: 06/10/1950

## 2019-04-13 ENCOUNTER — Encounter: Payer: Medicare Other | Admitting: Physical Therapy

## 2019-04-14 ENCOUNTER — Ambulatory Visit: Payer: Medicare Other | Admitting: Physical Therapy

## 2019-04-18 ENCOUNTER — Encounter: Payer: Medicare Other | Admitting: Physical Therapy

## 2019-04-19 ENCOUNTER — Ambulatory Visit: Payer: Medicare Other

## 2019-04-21 ENCOUNTER — Ambulatory Visit: Payer: Medicare Other | Admitting: Physical Therapy

## 2019-04-25 ENCOUNTER — Encounter: Payer: Medicare Other | Admitting: Physical Therapy

## 2019-04-26 ENCOUNTER — Other Ambulatory Visit: Payer: Self-pay

## 2019-04-26 ENCOUNTER — Ambulatory Visit: Payer: Medicare Other | Admitting: Physical Therapy

## 2019-04-26 DIAGNOSIS — M25672 Stiffness of left ankle, not elsewhere classified: Secondary | ICD-10-CM | POA: Diagnosis not present

## 2019-04-26 DIAGNOSIS — M6281 Muscle weakness (generalized): Secondary | ICD-10-CM | POA: Diagnosis not present

## 2019-04-26 DIAGNOSIS — M79672 Pain in left foot: Secondary | ICD-10-CM

## 2019-04-26 DIAGNOSIS — M25472 Effusion, left ankle: Secondary | ICD-10-CM

## 2019-04-26 DIAGNOSIS — G8929 Other chronic pain: Secondary | ICD-10-CM | POA: Diagnosis not present

## 2019-04-26 DIAGNOSIS — R269 Unspecified abnormalities of gait and mobility: Secondary | ICD-10-CM

## 2019-04-26 NOTE — Therapy (Signed)
[facility] [address] [place], [place], [address] Phone: [phone]   Fax:  [phone]  Physical Therapy Treatment  Patient Details  Name: Ross Riley MRN: [id number] Date of Birth: [birth year] Referring Provider (PT): Dr. Melrose Nakayama   Encounter Date: 04/26/2019  PT End of Session - 04/26/19 1709    Visit Number  6    Number of Visits  8    Date for PT Re-Evaluation  04/27/19    Authorization - Visit Number  6    Authorization - Number of Visits  10    PT Start Time  1300    PT Stop Time  1017    PT Time Calculation (min)  49 min    Equipment Utilized During Treatment  Gait belt    Activity Tolerance  Patient tolerated treatment well;Patient limited by pain    Behavior During Therapy  [facility] for tasks assessed/performed       Past Medical History:  Diagnosis Date  . Arthritis   . BPH (benign prostatic hyperplasia)   . Chronic kidney disease    kidney stones  . Dystonia   . ED (erectile dysfunction)   . GERD (gastroesophageal reflux disease)   . Glaucoma (increased eye pressure)   . Hematuria   . Hemorrhoids   . Hyperlipidemia   . Hypertension   . Thyroid disease     Past Surgical History:  Procedure Laterality Date  . CATARACT EXTRACTION Bilateral   . EXTRACORPOREAL SHOCK WAVE LITHOTRIPSY Right   . JOINT REPLACEMENT Left 08/2016   left knee, [place]  2009  . PROSTATE SURGERY  2011  . TRANSURETHRAL RESECTION OF PROSTATE N/A 12/06/2014   Procedure: TRANSURETHRAL RESECTION OF THE PROSTATE (TURP);  Surgeon: Nickie Retort, MD;  Location: [facility] ORS;  Service: Urology;  Laterality: N/A;    There were no vitals filed for this visit.  Subjective Assessment - 04/26/19 1706    Subjective  Pt. reports 7/10 correct pain and 9-10/10 pain in L ankle at worst prior to today's session. Pt. states his L ankle pain increases during raining days. Pt. reports L ankle pain decreases to 4/10 pain at the  end of today's session.    Pertinent History  Pt. is a 69 yo male and is currently retired. Pts primary language is Spanish and will need an interpreter for treatment sessions. Pt. reports that his L foot pain started in 1990 without MOI and the pain is getting worse especially with walking. A fall in November 2020 without L foot injury is reported. Pt. had L TKA on 08/20/2016.    Limitations  House hold activities;Lifting;Standing;Walking    Patient Stated Goals  decrease pain in L foot during walking    Currently in Pain?  Yes    Pain Score  7     Pain Location  Ankle    Pain Orientation  Left    Pain Descriptors / Indicators  Sharp    Pain Type  Chronic pain    Pain Onset  More than a month ago    Pain Frequency  Intermittent    Multiple Pain Sites  No       Manual Therapy: SupineMET, PF and hold for 5s then SPT pushes into DF,5reps x 1 set each side Ankle eversion/inversion/DF/PF isometric contraction against manual resistance 5 s hold x 10 reps  Manual L ankle talocrural distraction 20-30 s hold x 5 sets Supine hamstring/calf/gluts stretching 60  s-90 s on each side Toes extension stretching on L feet 30 s hold x 3 sets  Therex (< 6 min):  Nustep UE/LE 10 min L5 Standing wall stretching for calf 30 s x 3 sets on each side      PT Long Term Goals - 03/31/19 1135      PT LONG TERM GOAL #1   Title  Pt. will be able to ambulate with use of a SPC for >7 min with no reports of increased pain in L lateral ankle.    Baseline  Pt. can not walk for more than 1 min without a cane due to increased pain in his L foot.    Time  4    Period  Weeks    Status  New    Target Date  04/27/19      PT LONG TERM GOAL #2   Title  Pt. will score 48 in FOTO to indicate increase LE mobility function    Baseline  initial score: 19 on 03/30/2019    Time  4    Period  Weeks    Status  New    Target Date  04/27/19      PT LONG TERM GOAL #3   Title  Pt. independent with HEP to increase  bilateral PF strength to 5/5 MMT to improve push off phase during gait and increase ambulation efficiency.    Baseline  Pt. is able to complete 15 heels raise with both heels up but is limited by L anterior ankle pain    Time  4    Period  Weeks    Status  New    Target Date  04/27/19      PT LONG TERM GOAL #4   Title  Pt. will be able to increase B DF AROM to 10 deg to improve heel strike and decrease fall risks during gait.    Baseline  AROM DF (R: 3 deg, L: 1 deg.)    Time  4    Period  Weeks    Status  New    Target Date  04/27/19            Plan - 04/26/19 1718    Clinical Impression Statement  Pt. walks into clinic with a cane with increased L antalgic gait due to increased L ankle pain (7/10). Pt. tolerates MET for DF and manual plantar fascia stretching well without increased pain. Pt. has increased pain during manual talocural joint distraction but the symptom eases after SPT taking pressure off from anterior L ankle by switching hand placement. Pt. has increased reported posterior knee pain and increased hamstring stretching sensation during bilateral supine manual SLR stretching. Pt. tolerates well during ankle DF/PF/IV/EV isometric contraction against maual resistance. Pt has decreased L ankle pain from 7/10 to 4/10 at the end of today's session.    Examination-Activity Limitations  Bed Mobility;Lift;Squat;Stairs;Toileting;Stand;Dressing;Bathing    Stability/Clinical Decision Making  Evolving/Moderate complexity    Clinical Decision Making  Moderate    Rehab Potential  Fair    PT Frequency  2x / week    PT Duration  4 weeks    PT Treatment/Interventions  ADLs/Self Care Home Management;Cryotherapy;Software engineer;Therapeutic activities;Stair training;Therapeutic exercise;Balance training;Neuromuscular re-education;Patient/family education;Manual techniques;Passive range of motion;Energy conservation;Orthotic Fit/Training    PT Next Visit Plan   REASSESSMENT, FIGURE 8 for L ankle swelling    PT Home Exercise Plan  Access Code: [email or web address]    Consulted and Agree with Plan of  Care  Patient       Patient will benefit from skilled therapeutic intervention in order to improve the following deficits and impairments:  Abnormal gait, Decreased balance, Decreased endurance, Decreased mobility, Difficulty walking, Hypomobility, Decreased range of motion, Increased edema, Impaired tone, Improper body mechanics, Decreased activity tolerance, Decreased strength, Impaired flexibility, Pain  Visit Diagnosis: Chronic foot pain, left  Gait difficulty  Ankle joint stiffness, left  Muscle weakness (generalized)  Left ankle swelling     Problem List Patient Active Problem List   Diagnosis Date Noted  . Erectile dysfunction due to arterial insufficiency 09/14/2018  . Abnormal ejaculation 09/14/2018  . Former smoker 07/08/2017  . Hypothyroidism 07/08/2017  . Hx of adenomatous colonic polyps 06/09/2017  . History of diverticulitis 06/09/2017  . History of Clostridium difficile colitis 06/09/2017  . Recurrent colitis due to Clostridium difficile 03/25/2017  . Chills (without fever) 03/25/2017  . Diverticulitis 12/04/2016  . Anemia 12/04/2016  . Chronic right shoulder pain 11/24/2016  . Other constipation 11/24/2016  . BPPV (benign paroxysmal positional vertigo), bilateral 10/09/2016  . Knee stiffness 10/03/2016  . S/P TKR (total knee replacement), left 09/22/2016  . Osteoarthritis of left knee 06/05/2016  . Atypical intracranial meningioma ([place]) 05/22/2016  . Abdominal pain, LLQ (left lower quadrant) 02/11/2016  . Seasonal allergies 01/24/2015  . BPH (benign prostatic hyperplasia) 12/06/2014  . Pain in joint, ankle and foot 10/26/2014  . Gonalgia 10/26/2014  . Osteoarthritis of multiple joints 10/26/2014  . Bilateral cataracts 10/26/2014  . ED (erectile dysfunction) of organic origin 10/26/2014  . Acid reflux 10/26/2014  .  Hyperlipidemia associated with type 2 diabetes mellitus ([place]) 10/26/2014  . Essential hypertension 10/26/2014  . Type 2 diabetes mellitus with diabetic cataract, without long-term current use of insulin ([place]) 08/02/2014  . Focal dystonia 07/22/2012   Pura Spice, PT, DPT # [place], SPT 04/27/2019, 12:13 PM  [facility] [address] [place], [place], [address] Phone: [phone]   Fax:  [phone]  Name: ARYON NHAM MRN: [id number] Date of Birth: February 18, 1951

## 2019-04-27 ENCOUNTER — Encounter: Payer: Medicare Other | Admitting: Physical Therapy

## 2019-04-27 ENCOUNTER — Encounter: Payer: Self-pay | Admitting: Physical Therapy

## 2019-04-28 ENCOUNTER — Encounter: Payer: Self-pay | Admitting: Physical Therapy

## 2019-04-28 ENCOUNTER — Other Ambulatory Visit: Payer: Self-pay

## 2019-04-28 ENCOUNTER — Ambulatory Visit: Payer: Medicare Other | Admitting: Physical Therapy

## 2019-04-28 DIAGNOSIS — G8929 Other chronic pain: Secondary | ICD-10-CM | POA: Diagnosis not present

## 2019-04-28 DIAGNOSIS — R269 Unspecified abnormalities of gait and mobility: Secondary | ICD-10-CM

## 2019-04-28 DIAGNOSIS — M25672 Stiffness of left ankle, not elsewhere classified: Secondary | ICD-10-CM | POA: Diagnosis not present

## 2019-04-28 DIAGNOSIS — M79672 Pain in left foot: Secondary | ICD-10-CM | POA: Diagnosis not present

## 2019-04-28 DIAGNOSIS — M6281 Muscle weakness (generalized): Secondary | ICD-10-CM | POA: Diagnosis not present

## 2019-04-28 DIAGNOSIS — M25472 Effusion, left ankle: Secondary | ICD-10-CM

## 2019-04-28 NOTE — Therapy (Signed)
[facility] [address] [place], [place], [address] Phone: [phone]   Fax:  [phone]  Physical Therapy Treatment  Patient Details  Name: Ross Riley MRN: [id number] Date of Birth: [birth year] Referring Provider (PT): Dr. Melrose Nakayama   Encounter Date: 04/28/2019  PT End of Session - 04/28/19 1442    Visit Number  7    Number of Visits  14    Date for PT Re-Evaluation  05/26/19    Authorization - Visit Number  1    Authorization - Number of Visits  10    PT Start Time  1300    PT Stop Time  1402    PT Time Calculation (min)  62 min    Equipment Utilized During Treatment  Gait belt    Activity Tolerance  Patient tolerated treatment well;Patient limited by pain    Behavior During Therapy  [facility] for tasks assessed/performed       Past Medical History:  Diagnosis Date  . Arthritis   . BPH (benign prostatic hyperplasia)   . Chronic kidney disease    kidney stones  . Dystonia   . ED (erectile dysfunction)   . GERD (gastroesophageal reflux disease)   . Glaucoma (increased eye pressure)   . Hematuria   . Hemorrhoids   . Hyperlipidemia   . Hypertension   . Thyroid disease     Past Surgical History:  Procedure Laterality Date  . CATARACT EXTRACTION Bilateral   . EXTRACORPOREAL SHOCK WAVE LITHOTRIPSY Right   . JOINT REPLACEMENT Left 08/2016   left knee, [place]  2009  . PROSTATE SURGERY  2011  . TRANSURETHRAL RESECTION OF PROSTATE N/A 12/06/2014   Procedure: TRANSURETHRAL RESECTION OF THE PROSTATE (TURP);  Surgeon: Nickie Retort, MD;  Location: [facility] ORS;  Service: Urology;  Laterality: N/A;    There were no vitals filed for this visit.  Subjective Assessment - 04/28/19 1440    Subjective  Pt. reports 3/10 L ankle pain prior to today's session. Pt states he will see his MD next Wed.    Pertinent History  Pt. is a 69 yo male and is currently retired. Pts primary language is Spanish and will  need an interpreter for treatment sessions. Pt. reports that his L foot pain started in 1990 without MOI and the pain is getting worse especially with walking. A fall in November 2020 without L foot injury is reported. Pt. had L TKA on 08/20/2016.    Limitations  House hold activities;Lifting;Standing;Walking    Patient Stated Goals  decrease pain in L foot during walking    Currently in Pain?  Yes    Pain Score  3     Pain Location  Ankle    Pain Orientation  Left    Pain Descriptors / Indicators  Sharp    Pain Type  Chronic pain    Pain Onset  More than a month ago    Pain Frequency  Intermittent         [facility] PT Assessment - 04/29/19 0001      Assessment   Medical Diagnosis  L non-displaced fracture in proximal phalanx of lesser toe/ gait difficulty    Referring Provider (PT)  Dr. Melrose Nakayama    Onset Date/Surgical Date  03/03/18    Next MD Visit  May 04, 2019      Precautions   Precautions  Fall      Prior Function   Level  of Independence  Independent      Therex: Nustep 10 min L 5   Manual Therapy: SLR stretching/calf stretching 90 s on each side PROM in SLR (proximal hamstring: R 73 deg., R: 62 deg.) MET B hamstring hold for 5 s x 10 reps on each side MET calf hold for 5 s x 5 reps on each side Isometric R ankle contraction PF/DF hold for 5 s x 10 reps Isometric R ankle contraction IV/EV hold for 5 s x 5 reps Anterior and posterior talocrural joint mobs 5 s x 10 sets (grade III-IV)   Valgus/Varus test: mild increased laxity in L knee in MCL and LCL without increased pain Clonus: negative Bilateral knee/ankle reflexes: none, hyporeflexia. Gait training: 7 min walk in hallway, verbal cueing to increase heel strike 1 min walk outdoor on grassy and hard surfaces      PT Long Term Goals - 04/29/19 1230      PT LONG TERM GOAL #1   Title  Pt. will be able to ambulate with use of a SPC for >7 min with no reports of increased pain in L lateral ankle.    Baseline  Pt.  can not walk for more than 1 min without a cane due to increased pain in his L foot.  On2/25: marked increase in gait pattern with cuing for consistent heel strike with use of SPC    Time  4    Period  Weeks    Status  Partially Met    Target Date  05/26/19      PT LONG TERM GOAL #2   Title  Pt. will score 48 in FOTO to indicate increase LE mobility function    Baseline  initial score: 19 on 03/30/2019    Time  4    Period  Weeks    Status  On-going    Target Date  05/26/19      PT LONG TERM GOAL #3   Title  Pt. independent with HEP to increase bilateral PF strength to 5/5 MMT to improve push off phase during gait and increase ambulation efficiency.    Baseline  Pt. is able to complete 15 heels raise with both heels up but is limited by L anterior ankle pain    Time  4    Period  Weeks    Status  On-going    Target Date  05/26/19      PT LONG TERM GOAL #4   Title  Pt. will be able to increase B DF AROM to 10 deg to improve heel strike and decrease fall risks during gait.    Baseline  AROM DF (R: 3 deg, L: 1 deg.)    Time  4    Period  Weeks    Status  On-going    Target Date  05/26/19            Plan - 04/28/19 1501    Clinical Impression Statement  Pt. walks into clinic with a cane with L antagic gait. Clonus test: negative. Knee and ankle reflexes: none, hyporeflexia. Pt. has mild increase laxity in L knee during valgus and varus tests compared to R knee without pain. Pt. demonstrates increased PROM of proximal hamstring (R: 73 deg., L: 62 deg.) compared to before MET. Pt. has mild increased pain in L anterior ankle during anterior talocrural joint mobs (grade III-IV). Pts L ankle pain decreases from 3/10 to 1-2/10 after manual therapy. Pt demonstrates marked improvement in gait pattern  in hallway and outdoor walking (hard and grassy surfaces) with increase B heel strike, decrease L knee valgus and varus after verbal cueing and SPT's demonstration to reinforce the heel to toe  pattern during walking. Pt. is able to ambulate for 6-7 min without increased L ankle pain and with good B heel strike in hallway before requires rest break.    Examination-Activity Limitations  Bed Mobility;Lift;Squat;Stairs;Toileting;Stand;Dressing;Bathing    Stability/Clinical Decision Making  Evolving/Moderate complexity    Clinical Decision Making  Moderate    Rehab Potential  Fair    PT Frequency  2x / week    PT Duration  4 weeks    PT Treatment/Interventions  ADLs/Self Care Home Management;Cryotherapy;Software engineer;Therapeutic activities;Stair training;Therapeutic exercise;Balance training;Neuromuscular re-education;Patient/family education;Manual techniques;Passive range of motion;Energy conservation;Orthotic Fit/Training    PT Next Visit Plan  Send MD progress note    PT Home Exercise Plan  Access Code: [email or web address]    Consulted and Agree with Plan of Care  Patient       Patient will benefit from skilled therapeutic intervention in order to improve the following deficits and impairments:  Abnormal gait, Decreased balance, Decreased endurance, Decreased mobility, Difficulty walking, Hypomobility, Decreased range of motion, Increased edema, Impaired tone, Improper body mechanics, Decreased activity tolerance, Decreased strength, Impaired flexibility, Pain  Visit Diagnosis: Chronic foot pain, left  Gait difficulty  Ankle joint stiffness, left  Muscle weakness (generalized)  Left ankle swelling     Problem List Patient Active Problem List   Diagnosis Date Noted  . Erectile dysfunction due to arterial insufficiency 09/14/2018  . Abnormal ejaculation 09/14/2018  . Former smoker 07/08/2017  . Hypothyroidism 07/08/2017  . Hx of adenomatous colonic polyps 06/09/2017  . History of diverticulitis 06/09/2017  . History of Clostridium difficile colitis 06/09/2017  . Recurrent colitis due to Clostridium difficile 03/25/2017  . Chills (without fever)  03/25/2017  . Diverticulitis 12/04/2016  . Anemia 12/04/2016  . Chronic right shoulder pain 11/24/2016  . Other constipation 11/24/2016  . BPPV (benign paroxysmal positional vertigo), bilateral 10/09/2016  . Knee stiffness 10/03/2016  . S/P TKR (total knee replacement), left 09/22/2016  . Osteoarthritis of left knee 06/05/2016  . Atypical intracranial meningioma ([place]) 05/22/2016  . Abdominal pain, LLQ (left lower quadrant) 02/11/2016  . Seasonal allergies 01/24/2015  . BPH (benign prostatic hyperplasia) 12/06/2014  . Pain in joint, ankle and foot 10/26/2014  . Gonalgia 10/26/2014  . Osteoarthritis of multiple joints 10/26/2014  . Bilateral cataracts 10/26/2014  . ED (erectile dysfunction) of organic origin 10/26/2014  . Acid reflux 10/26/2014  . Hyperlipidemia associated with type 2 diabetes mellitus ([place]) 10/26/2014  . Essential hypertension 10/26/2014  . Type 2 diabetes mellitus with diabetic cataract, without long-term current use of insulin ([place]) 08/02/2014  . Focal dystonia 07/22/2012   Pura Spice, PT, DPT # [place], SPT 04/29/2019, 12:32 PM  [facility] [address] [place], [place], [address] Phone: [phone]   Fax:  [phone]  Name: Ross Riley MRN: [id number] Date of Birth: 1950/08/14

## 2019-05-02 ENCOUNTER — Encounter: Payer: Medicare Other | Admitting: Physical Therapy

## 2019-05-03 ENCOUNTER — Other Ambulatory Visit: Payer: Self-pay

## 2019-05-03 ENCOUNTER — Ambulatory Visit: Payer: Medicare Other | Attending: Neurology | Admitting: Physical Therapy

## 2019-05-03 ENCOUNTER — Encounter: Payer: Self-pay | Admitting: Physical Therapy

## 2019-05-03 DIAGNOSIS — M25672 Stiffness of left ankle, not elsewhere classified: Secondary | ICD-10-CM | POA: Diagnosis not present

## 2019-05-03 DIAGNOSIS — R269 Unspecified abnormalities of gait and mobility: Secondary | ICD-10-CM

## 2019-05-03 DIAGNOSIS — M6281 Muscle weakness (generalized): Secondary | ICD-10-CM

## 2019-05-03 DIAGNOSIS — G8929 Other chronic pain: Secondary | ICD-10-CM | POA: Diagnosis not present

## 2019-05-03 DIAGNOSIS — M79672 Pain in left foot: Secondary | ICD-10-CM | POA: Diagnosis not present

## 2019-05-03 DIAGNOSIS — M25472 Effusion, left ankle: Secondary | ICD-10-CM | POA: Diagnosis not present

## 2019-05-04 ENCOUNTER — Encounter: Payer: Medicare Other | Admitting: Physical Therapy

## 2019-05-04 NOTE — Therapy (Signed)
[facility] [address] [place], [place], [address] Phone: [phone]   Fax:  [phone]  Physical Therapy Treatment  Patient Details  Name: Ross Riley MRN: [id number] Date of Birth: Nov 22, 1950 Referring Provider (PT): Dr. Melrose Nakayama   Encounter Date: 05/03/2019  PT End of Session - 05/04/19 0923    Visit Number  8    Number of Visits  14    Date for PT Re-Evaluation  05/26/19    Authorization - Visit Number  2    Authorization - Number of Visits  10    PT Start Time  4585    PT Stop Time  1351    PT Time Calculation (min)  53 min    Equipment Utilized During Treatment  Gait belt    Activity Tolerance  Patient tolerated treatment well;Patient limited by pain    Behavior During Therapy  [facility] for tasks assessed/performed       Past Medical History:  Diagnosis Date  . Arthritis   . BPH (benign prostatic hyperplasia)   . Chronic kidney disease    kidney stones  . Dystonia   . ED (erectile dysfunction)   . GERD (gastroesophageal reflux disease)   . Glaucoma (increased eye pressure)   . Hematuria   . Hemorrhoids   . Hyperlipidemia   . Hypertension   . Thyroid disease     Past Surgical History:  Procedure Laterality Date  . CATARACT EXTRACTION Bilateral   . EXTRACORPOREAL SHOCK WAVE LITHOTRIPSY Right   . JOINT REPLACEMENT Left 08/2016   left knee, [place]  2009  . PROSTATE SURGERY  2011  . TRANSURETHRAL RESECTION OF PROSTATE N/A 12/06/2014   Procedure: TRANSURETHRAL RESECTION OF THE PROSTATE (TURP);  Surgeon: Nickie Retort, MD;  Location: [facility] ORS;  Service: Urology;  Laterality: N/A;    There were no vitals filed for this visit.  Subjective Assessment - 05/04/19 0903    Subjective  Pt. continues to be limited by L anterior ankle pain with walking/ prolonged standing tasks.  Pts. MD f/u rescheduled for next Wednesday with Dr. Melrose Nakayama.  Pt. states he is planning to get new shoes with  custom adjustments in the next 1-2 weeks.    Pertinent History  Pt. is a 69 yo male and is currently retired. Pts primary language is Spanish and will need an interpreter for treatment sessions. Pt. reports that his L foot pain started in 1990 without MOI and the pain is getting worse especially with walking. A fall in November 2020 without L foot injury is reported. Pt. had L TKA on 08/20/2016.    Limitations  House hold activities;Lifting;Standing;Walking    Patient Stated Goals  decrease pain in L foot during walking    Currently in Pain?  Yes    Pain Score  3     Pain Location  Ankle    Pain Orientation  Left    Pain Onset  More than a month ago       Therex: Nustep 10 min L5 B UE/LE Resisted gait 2BRB 5x all planes (focus on ankle/LE stability with step pattern) L ankle manual isometrics (moderate resistance)- 5x 10 sec. holds  Manual Therapy: SLR stretching/calf stretching 30s x3 on each side B hamstring stretches 3x each with static holds Anterior and posterior talocrural joint mobs 5 s x 10 sets (grade III-IV) Ankle stretches/ palpation over L anterior ankle Discuss inflammation and pain with pt. And proper  shoewear      PT Long Term Goals - 04/29/19 1230      PT LONG TERM GOAL #1   Title  Pt. will be able to ambulate with use of a SPC for >7 min with no reports of increased pain in L lateral ankle.    Baseline  Pt. can not walk for more than 1 min without a cane due to increased pain in his L foot.  On2/25: marked increase in gait pattern with cuing for consistent heel strike with use of SPC    Time  4    Period  Weeks    Status  Partially Met    Target Date  05/26/19      PT LONG TERM GOAL #2   Title  Pt. will score 48 in FOTO to indicate increase LE mobility function    Baseline  initial score: 19 on 03/30/2019    Time  4    Period  Weeks    Status  On-going    Target Date  05/26/19      PT LONG TERM GOAL #3   Title  Pt. independent with HEP to increase  bilateral PF strength to 5/5 MMT to improve push off phase during gait and increase ambulation efficiency.    Baseline  Pt. is able to complete 15 heels raise with both heels up but is limited by L anterior ankle pain    Time  4    Period  Weeks    Status  On-going    Target Date  05/26/19      PT LONG TERM GOAL #4   Title  Pt. will be able to increase B DF AROM to 10 deg to improve heel strike and decrease fall risks during gait.    Baseline  AROM DF (R: 3 deg, L: 1 deg.)    Time  4    Period  Weeks    Status  On-going    Target Date  05/26/19            Plan - 05/04/19 0924    Clinical Impression Statement  L ankle pain remain persistent during walking tasks in PT gym.  PT unable to reproduce L anterior ankle pain during manual stretches/ mobs.  Pt. has poor ankle support with current shoewear and will benefit from more supportive shoes.  Pt. is hoping to have new shoes over next 1-2 weeks.  No changes to HEP at this time and pt. instructed to focus on consistent heel strike/ toe off and focus on ankle strengthening ex.    Examination-Activity Limitations  Bed Mobility;Lift;Squat;Stairs;Toileting;Stand;Dressing;Bathing    Stability/Clinical Decision Making  Evolving/Moderate complexity    Clinical Decision Making  Moderate    Rehab Potential  Fair    PT Frequency  2x / week    PT Duration  4 weeks    PT Treatment/Interventions  ADLs/Self Care Home Management;Cryotherapy;Software engineer;Therapeutic activities;Stair training;Therapeutic exercise;Balance training;Neuromuscular re-education;Patient/family education;Manual techniques;Passive range of motion;Energy conservation;Orthotic Fit/Training    PT Next Visit Plan  Discuss MD f/u visit and assess new shoes    PT Home Exercise Plan  Access Code: [email or web address]    Consulted and Agree with Plan of Care  Patient       Patient will benefit from skilled therapeutic intervention in order to improve the  following deficits and impairments:  Abnormal gait, Decreased balance, Decreased endurance, Decreased mobility, Difficulty walking, Hypomobility, Decreased range of motion, Increased edema, Impaired tone, Improper body mechanics,  Decreased activity tolerance, Decreased strength, Impaired flexibility, Pain  Visit Diagnosis: Chronic foot pain, left  Gait difficulty  Ankle joint stiffness, left  Muscle weakness (generalized)  Left ankle swelling     Problem List Patient Active Problem List   Diagnosis Date Noted  . Erectile dysfunction due to arterial insufficiency 09/14/2018  . Abnormal ejaculation 09/14/2018  . Former smoker 07/08/2017  . Hypothyroidism 07/08/2017  . Hx of adenomatous colonic polyps 06/09/2017  . History of diverticulitis 06/09/2017  . History of Clostridium difficile colitis 06/09/2017  . Recurrent colitis due to Clostridium difficile 03/25/2017  . Chills (without fever) 03/25/2017  . Diverticulitis 12/04/2016  . Anemia 12/04/2016  . Chronic right shoulder pain 11/24/2016  . Other constipation 11/24/2016  . BPPV (benign paroxysmal positional vertigo), bilateral 10/09/2016  . Knee stiffness 10/03/2016  . S/P TKR (total knee replacement), left 09/22/2016  . Osteoarthritis of left knee 06/05/2016  . Atypical intracranial meningioma ([place]) 05/22/2016  . Abdominal pain, LLQ (left lower quadrant) 02/11/2016  . Seasonal allergies 01/24/2015  . BPH (benign prostatic hyperplasia) 12/06/2014  . Pain in joint, ankle and foot 10/26/2014  . Gonalgia 10/26/2014  . Osteoarthritis of multiple joints 10/26/2014  . Bilateral cataracts 10/26/2014  . ED (erectile dysfunction) of organic origin 10/26/2014  . Acid reflux 10/26/2014  . Hyperlipidemia associated with type 2 diabetes mellitus ([place]) 10/26/2014  . Essential hypertension 10/26/2014  . Type 2 diabetes mellitus with diabetic cataract, without long-term current use of insulin ([place]) 08/02/2014  . Focal dystonia  07/22/2012   Pura Spice, PT, DPT # [phone] 05/04/2019, 9:30 AM  [place] [address] [place], [place], [address] Phone: [phone]   Fax:  [phone]  Name: ISAO SELTZER MRN: [id number] Date of Birth: Dec 16, 1950

## 2019-05-10 ENCOUNTER — Ambulatory Visit
Admission: RE | Admit: 2019-05-10 | Discharge: 2019-05-10 | Disposition: A | Discharge status: Home / Self Care | Payer: Medicare Other | Source: Ambulatory Visit | Attending: Family Medicine | Admitting: Family Medicine

## 2019-05-10 ENCOUNTER — Encounter: Payer: Self-pay | Admitting: Family Medicine

## 2019-05-10 ENCOUNTER — Other Ambulatory Visit: Payer: Self-pay

## 2019-05-10 ENCOUNTER — Ambulatory Visit (INDEPENDENT_AMBULATORY_CARE_PROVIDER_SITE_OTHER): Payer: Medicare Other | Admitting: Family Medicine

## 2019-05-10 ENCOUNTER — Ambulatory Visit
Admission: RE | Admit: 2019-05-10 | Discharge: 2019-05-10 | Disposition: A | Discharge status: Home / Self Care | Payer: Medicare Other | Source: Home / Self Care | Attending: Family Medicine | Admitting: Family Medicine

## 2019-05-10 VITALS — BP 125/83 | HR 78 | Temp 97.3°F | Resp 16 | Ht 66.0 in | Wt 171.0 lb

## 2019-05-10 DIAGNOSIS — M545 Low back pain, unspecified: Secondary | ICD-10-CM

## 2019-05-10 DIAGNOSIS — G8929 Other chronic pain: Secondary | ICD-10-CM | POA: Diagnosis not present

## 2019-05-10 DIAGNOSIS — R0781 Pleurodynia: Secondary | ICD-10-CM | POA: Diagnosis not present

## 2019-05-10 DIAGNOSIS — R079 Chest pain, unspecified: Secondary | ICD-10-CM | POA: Diagnosis not present

## 2019-05-10 MED ORDER — NAPROXEN 500 MG PO TABS: 500.0000 mg | ORAL_TABLET | Freq: Two times a day (BID) | ORAL | 1 refills | Status: DC

## 2019-05-10 NOTE — Patient Instructions (Addendum)
Thank you for coming to the office today.  X-ray today, stay tuned for results will call.  Recommend trial of Anti-inflammatory with Naproxen (Naprosyn) 500mg  tabs - take one with food and plenty of water TWICE daily every day (breakfast and dinner), for next 2 to 4 weeks, then you may take only as needed - DO NOT TAKE any ibuprofen, aleve, motrin while you are taking this medicine - It is safe to take Tylenol Ext Str 500mg  tabs - take 1 to 2 (max dose 1000mg ) every 6 hours as needed for breakthrough pain, max 24 hour daily dose is 6 to 8 tablets or 4000mg   ----------------------------------------------------  Can also take Tramadol or Tylenol as needed  Please schedule a Follow-up Appointment to: Return in about 1 week (around 05/17/2019), or if symptoms worsen or fail to improve, for pleuritic pain.  If you have any other questions or concerns, please feel free to call the office or send a message through [place]. You may also schedule an earlier appointment if necessary.  Additionally, you may be receiving a survey about your experience at our office within a few days to 1 week by e-mail or mail. We value your feedback.  [name], DO [facility]

## 2019-05-10 NOTE — Progress Notes (Signed)
Subjective:    Patient ID: Ross Riley, male    DOB: Apr 30, 1950, 69 y.o.   MRN: [id number]  Ross Riley is a 69 y.o. male presenting on 05/10/2019 for Back Pain (as per patient when he breaths he feels pain onset 2 weeks more on Right side)   HPI  Right Back vs Flank Pain / Pleuritic Pain - Last visit with me 04/08/19, for initial visit for same problem R back pleuritic pain, treated with muscle relaxant baclofen, see prior notes for background information. - Interval update with limited relief in past 4 weeks, not improved on baclofen - Today patient reports worsening R sided low back pain with radiation up to R flank, seems primarily hurting only with movement rotation to R and also with deeper breath pleuritic pain. Denies any significant cough, dyspnea, chest pain, swelling, palpitations, nausea vomiting, abdominal pain, fall or injury   Depression screen [facility] [place] 2/9 11/05/2018 09/20/2018 04/21/2018  Decreased Interest 0 0 0  Down, Depressed, Hopeless 0 0 0  PHQ - 2 Score 0 0 0  Altered sleeping - - -  Tired, decreased energy - - -  Change in appetite - - -  Feeling bad or failure about yourself  - - -  Trouble concentrating - - -  Moving slowly or fidgety/restless - - -  Suicidal thoughts - - -  PHQ-9 Score - - -  Difficult doing work/chores - - -    Social History   Tobacco Use  . Smoking status: Former Smoker    Packs/day: 1.00    Years: 32.00    Pack years: 32.00    Types: Cigarettes    Quit date: 03/04/1991    Years since quitting: 28.2  . Smokeless tobacco: Former Network engineer Use Topics  . Alcohol use: Yes    Alcohol/week: 0.0 standard drinks    Comment: occasional  . Drug use: No    Review of Systems Per HPI unless specifically indicated above     Objective:    BP 125/83   Pulse 78   Temp (!) 97.3 F (36.3 C) (Temporal)   Resp 16   Ht 5\' 6"  (1.676 m)   Wt 171 lb (77.6 kg)   BMI 27.60 kg/m   Wt Readings from Last 3 Encounters:  05/10/19 171  lb (77.6 kg)  04/08/19 172 lb 3.2 oz (78.1 kg)  11/23/18 169 lb (76.7 kg)    Physical Exam Vitals and nursing note reviewed.  Constitutional:      General: He is not in acute distress.    Appearance: He is well-developed. He is not diaphoretic.     Comments: Well-appearing, comfortable, cooperative  HENT:     Head: Normocephalic and atraumatic.  Eyes:     General:        Right eye: No discharge.        Left eye: No discharge.     Conjunctiva/sclera: Conjunctivae normal.  Cardiovascular:     Rate and Rhythm: Normal rate.     Pulses: Normal pulses.     Heart sounds: Normal heart sounds. No murmur.  Pulmonary:     Effort: Pulmonary effort is normal. No respiratory distress.     Breath sounds: Normal breath sounds. No stridor. No wheezing, rhonchi or rales.     Comments: Some discomfort and slightly stopped breath if took too deep breath due to pain Chest:     Chest wall: No tenderness (No reproducible chest or flank rib tenderness on  exam).  Skin:    General: Skin is warm and dry.     Findings: No erythema or rash.  Neurological:     Mental Status: He is alert and oriented to person, place, and time.  Psychiatric:        Behavior: Behavior normal.     Comments: Well groomed, good eye contact, normal speech and thoughts      I have personally reviewed the radiology report from 05/10/19 on STAT CXR.  CLINICAL DATA:  Right-sided pleuritic and back pain.  Flank pain.  EXAM: CHEST - 2 VIEW  COMPARISON:  None.  FINDINGS: Degenerative changes in the thoracic spine. The heart, hila, mediastinum, lungs, and pleura are otherwise normal.  IMPRESSION: Degenerative changes in the thoracic spine. No other acute abnormalities.   Electronically Signed   By: Dorise Bullion III M.D   On: 05/10/2019 15:12  Results for orders placed or performed in visit on 04/08/19  POCT glycosylated hemoglobin (Hb A1C)  Result Value Ref Range   Hemoglobin A1C 6.4 (A) 4.0 - 5.6 %        Assessment & Plan:   Problem List Items Addressed This Visit    None    Visit Diagnoses    Pleuritic pain    -  Primary   Relevant Medications   naproxen (NAPROSYN) 500 MG tablet   Other Relevant Orders   DG Chest 2 View (Completed)   Chronic right-sided low back pain without sciatica       Relevant Medications   traMADol (ULTRAM) 50 MG tablet   naproxen (NAPROSYN) 500 MG tablet   Other Relevant Orders   DG Chest 2 View (Completed)      Clinically uncertain R sided low back/mid flank pleuritic positional pain Clinically not supportive of acute infection or pulmonary or cardiac etiology, given lack of concerning symptoms and non exertional. Worse with position/rotation and deep breathing only seems very predictable and provoked No injury or other cause. Possibly considered coughing or strain to affect rib but non reproducible on palpation Already has tramadol PRN Not on NSAID  Offer STAT Chest X-ray today  Rx Naproxen 500 BID wc 2-4 weeks for now likely inflammatory process with underlying OA/DJD and also possible pleuritis or costochondritis component  Now after result of CXR - no acute abnormality and no fracture. Reassuring, called patient, advised start NSAID for now and follow-up if not improved.   Return precautions given if worse when to seek care sooner here vs at hospital ED  Meds ordered this encounter  Medications  . naproxen (NAPROSYN) 500 MG tablet    Sig: Take 1 tablet (500 mg total) by mouth 2 (two) times daily with a meal. For 2-4 weeks then as needed    Dispense:  60 tablet    Refill:  1      Follow up plan: Return in about 1 week (around 05/17/2019), or if symptoms worsen or fail to improve, for pleuritic pain.   [name], [facility] 05/10/2019, 2:25 PM

## 2019-05-16 ENCOUNTER — Ambulatory Visit: Payer: Medicare Other | Admitting: Physical Therapy

## 2019-05-16 ENCOUNTER — Other Ambulatory Visit: Payer: Self-pay

## 2019-05-16 DIAGNOSIS — M25672 Stiffness of left ankle, not elsewhere classified: Secondary | ICD-10-CM

## 2019-05-16 DIAGNOSIS — R269 Unspecified abnormalities of gait and mobility: Secondary | ICD-10-CM

## 2019-05-16 DIAGNOSIS — M79672 Pain in left foot: Secondary | ICD-10-CM

## 2019-05-16 DIAGNOSIS — G8929 Other chronic pain: Secondary | ICD-10-CM

## 2019-05-16 DIAGNOSIS — M25472 Effusion, left ankle: Secondary | ICD-10-CM

## 2019-05-16 DIAGNOSIS — M6281 Muscle weakness (generalized): Secondary | ICD-10-CM

## 2019-05-16 NOTE — Therapy (Signed)
[facility] [address] [place], [place], [address] Phone: [phone]   Fax:  [phone]  Physical Therapy Treatment  Patient Details  Name: Ross Riley MRN: [id number] Date of Birth: 1950-08-10 Referring Provider (PT): Dr. Melrose Nakayama   Encounter Date: 05/16/2019  PT End of Session - 05/16/19 1328    Visit Number  8    Number of Visits  14    Date for PT Re-Evaluation  05/26/19    Authorization - Visit Number  2    Authorization - Number of Visits  10       Past Medical History:  Diagnosis Date  . Arthritis   . BPH (benign prostatic hyperplasia)   . Chronic kidney disease    kidney stones  . Dystonia   . ED (erectile dysfunction)   . GERD (gastroesophageal reflux disease)   . Glaucoma (increased eye pressure)   . Hematuria   . Hemorrhoids   . Hyperlipidemia   . Hypertension   . Thyroid disease     Past Surgical History:  Procedure Laterality Date  . CATARACT EXTRACTION Bilateral   . EXTRACORPOREAL SHOCK WAVE LITHOTRIPSY Right   . JOINT REPLACEMENT Left 08/2016   left knee, [place]  2009  . PROSTATE SURGERY  2011  . TRANSURETHRAL RESECTION OF PROSTATE N/A 12/06/2014   Procedure: TRANSURETHRAL RESECTION OF THE PROSTATE (TURP);  Surgeon: Nickie Retort, MD;  Location: [facility] ORS;  Service: Urology;  Laterality: N/A;    There were no vitals filed for this visit.  Subjective Assessment - 05/16/19 1326    Subjective  Pt. called PT prior to scheduled appt. at [place] today.  Pt. states he has not received new orthotics yet.  Pt./ PT discussed holding off on PT tx. sessions until orthotics arrive this week or next.  Pt. reports no foot pain at this time and will contact PT once orthotics arrive.    Pertinent History  Pt. is a 69 yo male and is currently retired. Pts primary language is Spanish and will need an interpreter for treatment sessions. Pt. reports that his L foot pain started in 1990  without MOI and the pain is getting worse especially with walking. A fall in November 2020 without L foot injury is reported. Pt. had L TKA on 08/20/2016.    Limitations  House hold activities;Lifting;Standing;Walking    Patient Stated Goals  decrease pain in L foot during walking    Currently in Pain?  No/denies    Pain Onset  More than a month ago         No PT services today.  PT discussed pts. Status over the phone.  Pt. Still waiting to receive orthotics.      PT Long Term Goals - 04/29/19 1230      PT LONG TERM GOAL #1   Title  Pt. will be able to ambulate with use of a SPC for >7 min with no reports of increased pain in L lateral ankle.    Baseline  Pt. can not walk for more than 1 min without a cane due to increased pain in his L foot.  On2/25: marked increase in gait pattern with cuing for consistent heel strike with use of SPC    Time  4    Period  Weeks    Status  Partially Met    Target Date  05/26/19      PT LONG TERM GOAL #2  Title  Pt. will score 48 in FOTO to indicate increase LE mobility function    Baseline  initial score: 19 on 03/30/2019    Time  4    Period  Weeks    Status  On-going    Target Date  05/26/19      PT LONG TERM GOAL #3   Title  Pt. independent with HEP to increase bilateral PF strength to 5/5 MMT to improve push off phase during gait and increase ambulation efficiency.    Baseline  Pt. is able to complete 15 heels raise with both heels up but is limited by L anterior ankle pain    Time  4    Period  Weeks    Status  On-going    Target Date  05/26/19      PT LONG TERM GOAL #4   Title  Pt. will be able to increase B DF AROM to 10 deg to improve heel strike and decrease fall risks during gait.    Baseline  AROM DF (R: 3 deg, L: 1 deg.)    Time  4    Period  Weeks    Status  On-going    Target Date  05/26/19            Plan - 05/16/19 1329    Clinical Impression Statement  No tx. or charges today.  Pt. will contact PT this week  once orthotics arrive to reschedule PT appt.    Examination-Activity Limitations  Bed Mobility;Lift;Squat;Stairs;Toileting;Stand;Dressing;Bathing    Stability/Clinical Decision Making  Evolving/Moderate complexity    Clinical Decision Making  Moderate    Rehab Potential  Fair    PT Frequency  2x / week    PT Duration  4 weeks    PT Treatment/Interventions  ADLs/Self Care Home Management;Cryotherapy;Software engineer;Therapeutic activities;Stair training;Therapeutic exercise;Balance training;Neuromuscular re-education;Patient/family education;Manual techniques;Passive range of motion;Energy conservation;Orthotic Fit/Training    PT Next Visit Plan  Discuss MD f/u visit and assess new shoes    PT Home Exercise Plan  Access Code: [email or web address]    Consulted and Agree with Plan of Care  Patient       Patient will benefit from skilled therapeutic intervention in order to improve the following deficits and impairments:  Abnormal gait, Decreased balance, Decreased endurance, Decreased mobility, Difficulty walking, Hypomobility, Decreased range of motion, Increased edema, Impaired tone, Improper body mechanics, Decreased activity tolerance, Decreased strength, Impaired flexibility, Pain  Visit Diagnosis: Chronic foot pain, left  Gait difficulty  Ankle joint stiffness, left  Muscle weakness (generalized)  Left ankle swelling     Problem List Patient Active Problem List   Diagnosis Date Noted  . Erectile dysfunction due to arterial insufficiency 09/14/2018  . Abnormal ejaculation 09/14/2018  . Former smoker 07/08/2017  . Hypothyroidism 07/08/2017  . Hx of adenomatous colonic polyps 06/09/2017  . History of diverticulitis 06/09/2017  . History of Clostridium difficile colitis 06/09/2017  . Recurrent colitis due to Clostridium difficile 03/25/2017  . Chills (without fever) 03/25/2017  . Diverticulitis 12/04/2016  . Anemia 12/04/2016  . Chronic right shoulder  pain 11/24/2016  . Other constipation 11/24/2016  . BPPV (benign paroxysmal positional vertigo), bilateral 10/09/2016  . Knee stiffness 10/03/2016  . S/P TKR (total knee replacement), left 09/22/2016  . Osteoarthritis of left knee 06/05/2016  . Atypical intracranial meningioma ([place]) 05/22/2016  . Abdominal pain, LLQ (left lower quadrant) 02/11/2016  . Seasonal allergies 01/24/2015  . BPH (benign prostatic hyperplasia) 12/06/2014  . Pain in joint,  ankle and foot 10/26/2014  . Gonalgia 10/26/2014  . Osteoarthritis of multiple joints 10/26/2014  . Bilateral cataracts 10/26/2014  . ED (erectile dysfunction) of organic origin 10/26/2014  . Acid reflux 10/26/2014  . Hyperlipidemia associated with type 2 diabetes mellitus ([place]) 10/26/2014  . Essential hypertension 10/26/2014  . Type 2 diabetes mellitus with diabetic cataract, without long-term current use of insulin ([place]) 08/02/2014  . Focal dystonia 07/22/2012   Pura Spice, PT, DPT # [phone] 05/16/2019, 1:30 PM  [place] [facility] [address] [place], [place], [address] Phone: [phone]   Fax:  [phone]  Name: Ross Riley MRN: [id number] Date of Birth: 1950/03/11

## 2019-05-17 ENCOUNTER — Ambulatory Visit (INDEPENDENT_AMBULATORY_CARE_PROVIDER_SITE_OTHER): Payer: Medicare Other | Admitting: General Surgery

## 2019-05-17 ENCOUNTER — Encounter: Payer: Self-pay | Admitting: General Surgery

## 2019-05-17 VITALS — BP 170/98 | HR 69 | Temp 97.3°F | Resp 12 | Ht 66.0 in | Wt 171.0 lb

## 2019-05-17 DIAGNOSIS — L720 Epidermal cyst: Secondary | ICD-10-CM

## 2019-05-17 NOTE — Progress Notes (Signed)
Patient ID: Ross Riley, male   DOB: [birth year], 69 y.o.   MRN: [id number]  Chief Complaint  Patient presents with  . Follow-up    Cyst upper back     HPI Ross Riley is a 69 y.o. male.   I initially saw him in September 2020.  My HPI from that visit is copied here:   He has been referred by his primary care provider, Dr. Nobie Riley, for evaluation of epidermal inclusion cysts on his back.  Today's visit was held with the assistance of a Spanish language interpreter.  Ross Riley states that about a year ago, he had a cyst on his back drained by a dermatologist.  He says that it has come back or did not completely go away, as he feels that it is still enlarged.  He also has a second 1 that he says he squeezes sometimes and gets a little bit of "pus and blood."  He states that neither of the lesions is painful.  He has not had any fevers or chills.  At that visit, he indicated that the lesions were not bothering him to the extent that he would want them excised.  We discussed that if they grew, became painful, red, or drained pus, that he should contact our office.  The largest of these has grown in the intervening months, and therefore he contacted [place].  Today's visit is held with the assistance of a [place] language interpreter, Ross Riley.  Today, he states that the only thing he has noticed is the growth.  He denies any pain.  No fevers or chills.  No nausea or vomiting.  There has been no redness, warmth, or drainage.  He does express a desire to have the largest lesion removed.   Past Medical History:  Diagnosis Date  . Arthritis   . BPH (benign prostatic hyperplasia)   . Chronic kidney disease    kidney stones  . Dystonia   . ED (erectile dysfunction)   . GERD (gastroesophageal reflux disease)   . Glaucoma (increased eye pressure)   . Hematuria   . Hemorrhoids   . Hyperlipidemia   . Hypertension   . Thyroid disease     Past Surgical History:  Procedure  Laterality Date  . CATARACT EXTRACTION Bilateral   . EXTRACORPOREAL SHOCK WAVE LITHOTRIPSY Right   . JOINT REPLACEMENT Left 08/2016   left knee, [place]  2009  . PROSTATE SURGERY  2011  . TRANSURETHRAL RESECTION OF PROSTATE N/A 12/06/2014   Procedure: TRANSURETHRAL RESECTION OF THE PROSTATE (TURP);  Surgeon: Nickie Retort, MD;  Location: [facility] ORS;  Service: Urology;  Laterality: N/A;    Family History  Problem Relation Age of Onset  . Heart disease Mother     Social History Social History   Tobacco Use  . Smoking status: Former Smoker    Packs/day: 1.00    Years: 32.00    Pack years: 32.00    Types: Cigarettes    Quit date: 03/04/1991    Years since quitting: 28.2  . Smokeless tobacco: Former Network engineer Use Topics  . Alcohol use: Yes    Alcohol/week: 0.0 standard drinks    Comment: occasional  . Drug use: No    Allergies  Allergen Reactions  . Metformin And Related Other (See Comments)    Headache and dizzines  . Metformin Hcl Other (See Comments)    headache and dizziness    Current Outpatient Medications  Medication Sig Dispense Refill  . ACCU-CHEK FASTCLIX LANCETS MISC check blood sugar up to 1 time daily as directed 102 each 3  . Alcohol Swabs (B-D SINGLE USE SWABS REGULAR) PADS check blood sugar up to 1 time daily as directed 100 each 3  . aspirin EC 81 MG tablet Take by mouth.    Marland Kitchen atorvastatin (LIPITOR) 20 MG tablet TAKE 1 TABLET(20 MG) BY MOUTH DAILY 90 tablet 1  . benazepril-hydrochlorthiazide (LOTENSIN HCT) 10-12.5 MG tablet TAKE 1 TABLET BY MOUTH DAILY 90 tablet 3  . Blood Glucose Monitoring Suppl (ACCU-CHEK AVIVA PLUS) w/Device KIT Use device to check blood sugar up to 1 time daily as directed 1 kit 0  . brimonidine (ALPHAGAN) 0.2 % ophthalmic solution brimonidine 0.2 % eye drops  INSTILL 1 DROP INTO OU TID    . carbidopa-levodopa (SINEMET IR) 25-100 MG tablet Take 1 tablet by mouth 3 (three) times daily.    .  Cholecalciferol (VITAMIN D3) 125 MCG (5000 UT) CAPS Take by mouth.    . dicyclomine (BENTYL) 10 MG capsule Take 1 capsule (10 mg total) by mouth 4 (four) times daily -  before meals and at bedtime. As needed for abdominal cramping 30 capsule 0  . dorzolamide (TRUSOPT) 2 % ophthalmic solution Apply to eye.    . dorzolamide-timolol (COSOPT) 22.3-6.8 MG/ML ophthalmic solution dorzolamide 22.3 mg-timolol 6.8 mg/mL eye drops    . glipiZIDE (GLUCOTROL XL) 5 MG 24 hr tablet TAKE 1 TABLET(5 MG) BY MOUTH DAILY WITH BREAKFAST 90 tablet 3  . glucose blood test strip check blood sugar up to 1 time daily as directed 100 each 3  . latanoprost (XALATAN) 0.005 % ophthalmic solution INT 1 GTT INTO OU QHS  6  . levothyroxine (SYNTHROID) 75 MCG tablet Take 50 mcg by mouth.     . metoprolol tartrate (LOPRESSOR) 25 MG tablet TAKE 1/2 TABLET(12.5 MG) BY MOUTH TWICE DAILY 90 tablet 3  . naproxen (NAPROSYN) 500 MG tablet Take 1 tablet (500 mg total) by mouth 2 (two) times daily with a meal. For 2-4 weeks then as needed 60 tablet 1  . nortriptyline (PAMELOR) 10 MG capsule take 30 mg nightly for one week, then increase to 40 mg nightly    . omeprazole (PRILOSEC) 40 MG capsule TAKE 1 CAPSULE BY MOUTH DAILY BEFORE BREAKFAST 15 TO 30 MINUTES BEFORE FIRST MEAL OF THE DAY 270 capsule 1  . sildenafil (VIAGRA) 100 MG tablet Take 1 tablet (100 mg total) by mouth daily as needed for erectile dysfunction. Take two hours prior to intercourse on an empty stomach 30 tablet 3   No current facility-administered medications for this visit.    Review of Systems Review of Systems  All other systems reviewed and are negative.   Blood pressure (!) 170/98, pulse 69, temperature (!) 97.3 F (36.3 C), resp. rate 12, height '5\' 6"'$  (1.676 m), weight 171 lb (77.6 kg), SpO2 97 %.  Physical Exam Physical Exam Constitutional:      Appearance: Normal appearance. He is obese.  HENT:     Head: Normocephalic and atraumatic.     Nose:      Comments: Covered with a mask secondary to COVID-19 precautions    Mouth/Throat:     Comments: Covered with a mask secondary to COVID-19 precautions Eyes:     General: No scleral icterus.       Right eye: No discharge.        Left eye: No discharge.     Conjunctiva/sclera:  Conjunctivae normal.  Cardiovascular:     Rate and Rhythm: Normal rate and regular rhythm.  Pulmonary:     Effort: Pulmonary effort is normal. No respiratory distress.  Abdominal:     Palpations: Abdomen is soft.  Genitourinary:    Comments: Deferred Musculoskeletal:        General: No deformity.  Skin:         Comments: Multiple skin tags and moles on all visualized skin surfaces.  There are a total of 7 small sebaceous cysts on the patient's upper back.  The largest of these has evidence of prior trauma, but is not inflamed today.  Neurological:     General: No focal deficit present.     Mental Status: He is alert and oriented to person, place, and time.  Psychiatric:        Mood and Affect: Mood normal.        Behavior: Behavior normal.     Data Reviewed There are no new relevant data available to review.  Assessment This is a 69 year old man with multiple sebaceous cysts on his back.  The largest one has grown to the point that he would like to have it removed.  Plan We have scheduled a procedure-only visit for Mr. Gelin.  We will remove the cyst at that time.  The risks of the procedure were discussed with him.  These include, but are not limited to, bleeding, infection, recurrence, unanticipated findings necessitating further evaluation and management.  He had the opportunity to ask questions and have them answered to his satisfaction.  We will see him for his procedure visit next week.    Fredirick Maudlin 05/17/2019, 1:43 PM

## 2019-05-17 NOTE — Patient Instructions (Signed)
Lo veremos el proximo Jueves para el extirpacion de su quiste.   Extirpacin de un quiste sebceo Epidermal Cyst Removal  La extirpacin de un quiste sebceo es un procedimiento para extraer un saco de material graso (queratina) que se ha formado debajo de la piel (quiste sebceo). Los quistes sebceos tambin se conocen como quistes epidermoides, sebceos o queratnicos. Normalmente, la piel segrega este material oleoso a travs de una glndula o un folculo de cabello. Sin embargo, cuando una glndula de la piel o un folculo de cabello se obstruyen, puede formarse un quiste sebceo. Puede necesitar este procedimiento si tiene un quiste sebceo que se agranda, molesta o se infecta. Informe al mdico acerca de lo siguiente:  Cualquier alergia que tenga.  Todos los [employer], incluidos vitaminas, hierbas, gotas oftlmicas, cremas y medicamentos de venta libre.  Cualquier problema previo que usted o algn miembro de su familia haya tenido con los anestsicos.  Cualquier trastorno de la sangre que tenga.  Cirugas a las que se haya sometido.  Cualquier afeccin mdica que tenga ahora o haya tenido.  Si est embarazada o podra estarlo. Cules son los riesgos? En general, se trata de un procedimiento seguro. Sin embargo, pueden ocurrir complicaciones, por ejemplo:  Desarrollo de otro quiste.  Sangrado.  Infeccin.  Formacin de cicatrices. Qu ocurre antes del procedimiento?  Consulte al mdico sobre: ? Quarry manager o suspender los medicamentos que toma habitualmente. Esto es muy importante si toma medicamentos para la diabetes o anticoagulantes. ? Tomar medicamentos como aspirina e ibuprofeno. Estos medicamentos pueden tener un efecto anticoagulante en la [place]. No tome estos medicamentos a menos que el mdico se lo indique. ? Tomar medicamentos de [employer], vitaminas, hierbas y suplementos.  Si tiene un quiste infectado, es posible que tenga que tomar  antibiticos antes de la extirpacin del [place]. Tome los antibiticos como se lo haya indicado el mdico. No deje de tomar el antibitico, aunque comience a sentirse mejor.  Dchese la [place] en que se realizar el procedimiento. El mdico puede indicarle que use un jabn para destruir las bacterias. Qu ocurre durante el procedimiento?   Le administrarn un medicamento para adormecer la zona (anestesia local).  Le limpiarn la piel alrededor del quiste con una solucin para destruir las bacterias.  El mdico realizar una pequea incisin en la piel sobre el [place].  Separar el quiste de los tejidos circundantes que estn debajo de la piel.  Si es posible, le extirparn el quiste sin daarlo (intacto).  Si el quiste se revienta (se rompe), se extirpar en secciones.  Despus de la extirpacin del quiste, el mdico controlar el sangrado y cerrar la incisin con pequeos puntos (suturas). Las incisiones pequeas pueden no necesitar suturas, y Garment/textile technologist se controlar al presionar el lugar directamente utilizando una gasa.  El mdico puede aplicarle un ungento antibitico y Ardelia Mems venda (vendaje) liviana sobre la incisin. El procedimiento puede variar segn el mdico y el hospital. Sander Nephew ocurre despus del procedimiento?  Si el quiste se rompe durante el procedimiento, es posible que necesite un antibitico. Si le recetaron un medicamento o ungento antibitico, tmelo o aplqueselo como se lo haya indicado el mdico. No deje de usar el antibitico aunque comience a Sports administrator. Resumen  La extirpacin de un quiste sebceo es un procedimiento para extraer un saco de material graso (queratina) que se ha formado debajo de la piel (quiste sebceo).  Puede necesitar este procedimiento si tiene un quiste sebceo que se Gracy Bruins  o se infecta.  El mdico realizar una pequea incisin en la piel para extirpar el quiste.  Si le recetan un antibitico antes del  procedimiento, despus del procedimiento o en ambos casos, use el antibitico como se lo haya indicado el mdico. No deje de usar el antibitico aunque comience a Sports administrator. Esta informacin no tiene Marine scientist el consejo del mdico. Asegrese de hacerle al mdico cualquier pregunta que tenga. Document Revised: 07/15/2017 Document Reviewed: 02/09/2017 Elsevier Patient Education  [place].

## 2019-05-24 ENCOUNTER — Other Ambulatory Visit: Payer: Self-pay

## 2019-05-24 ENCOUNTER — Ambulatory Visit (INDEPENDENT_AMBULATORY_CARE_PROVIDER_SITE_OTHER): Payer: Medicare Other

## 2019-05-24 VITALS — BP 158/76 | HR 58 | Temp 97.8°F | Ht 66.0 in | Wt 172.2 lb

## 2019-05-24 DIAGNOSIS — Z0000 Encounter for general adult medical examination without abnormal findings: Secondary | ICD-10-CM | POA: Diagnosis not present

## 2019-05-24 NOTE — Patient Instructions (Signed)
[name] , Thank you for taking time to come for your Medicare Wellness Visit. I appreciate your ongoing commitment to your health goals. Please review the following plan we discussed and let me know if I can assist you in the future.   Screening recommendations/referrals: Colonoscopy: 2018, due 2028 Recommended yearly ophthalmology/optometry visit for glaucoma screening and checkup Recommended yearly dental visit for hygiene and checkup  Vaccinations: Influenza vaccine: up to date Pneumococcal vaccine: up to date  Tdap vaccine: up to date Shingles vaccine: shingrix eligible    Covid-19: completed    Conditions/risks identified: diabetic, discussed chronic care management team  Next appointment: Follow up in one year for your annual wellness visit   Preventive Care 23 Years and Older, Male Preventive care refers to lifestyle choices and visits with your health care provider that can promote health and wellness. What does preventive care include?  A yearly physical exam. This is also called an annual well check.  Dental exams once or twice a year.  Routine eye exams. Ask your health care provider how often you should have your eyes checked.  Personal lifestyle choices, including:  Daily care of your teeth and gums.  Regular physical activity.  Eating a healthy diet.  Avoiding tobacco and drug use.  Limiting alcohol use.  Practicing safe sex.  Taking low doses of aspirin every day.  Taking vitamin and mineral supplements as recommended by your health care provider. What happens during an annual well check? The services and screenings done by your health care provider during your annual well check will depend on your age, overall health, lifestyle risk factors, and family history of disease. Counseling  Your health care provider may ask you questions about your:  Alcohol use.  Tobacco use.  Drug use.  Emotional well-being.  Home and relationship  well-being.  Sexual activity.  Eating habits.  History of falls.  Memory and ability to understand (cognition).  Work and work Statistician. Screening  You may have the following tests or measurements:  Height, weight, and BMI.  Blood pressure.  Lipid and cholesterol levels. These may be checked every 5 years, or more frequently if you are over 1 years old.  Skin check.  Lung cancer screening. You may have this screening every year starting at age 31 if you have a 30-pack-year history of smoking and currently smoke or have quit within the past 15 years.  Fecal occult blood test (FOBT) of the stool. You may have this test every year starting at age 82.  Flexible sigmoidoscopy or colonoscopy. You may have a sigmoidoscopy every 5 years or a colonoscopy every 10 years starting at age 78.  Prostate cancer screening. Recommendations will vary depending on your family history and other risks.  Hepatitis C blood test.  Hepatitis B blood test.  Sexually transmitted disease (STD) testing.  Diabetes screening. This is done by checking your blood sugar (glucose) after you have not eaten for a while (fasting). You may have this done every 1-3 years.  Abdominal aortic aneurysm (AAA) screening. You may need this if you are a current or former smoker.  Osteoporosis. You may be screened starting at age 59 if you are at high risk. Talk with your health care provider about your test results, treatment options, and if necessary, the need for more tests. Vaccines  Your health care provider may recommend certain vaccines, such as:  Influenza vaccine. This is recommended every year.  Tetanus, diphtheria, and acellular pertussis (Tdap, Td) vaccine. You  may need a Td booster every 10 years.  Zoster vaccine. You may need this after age 82.  Pneumococcal 13-valent conjugate (PCV13) vaccine. One dose is recommended after age 79.  Pneumococcal polysaccharide (PPSV23) vaccine. One dose is  recommended after age 81. Talk to your health care provider about which screenings and vaccines you need and how often you need them. This information is not intended to replace advice given to you by your health care provider. Make sure you discuss any questions you have with your health care provider. Document Released: 03/16/2015 Document Revised: 11/07/2015 Document Reviewed: 12/19/2014 Elsevier Interactive Patient Education  2017 [place] Prevention in the Home Falls can cause injuries. They can happen to people of all ages. There are many things you can do to make your home safe and to help prevent falls. What can I do on the outside of my home?  Regularly fix the edges of walkways and driveways and fix any cracks.  Remove anything that might make you trip as you walk through a door, such as a raised step or threshold.  Trim any bushes or trees on the path to your home.  Use bright outdoor lighting.  Clear any walking paths of anything that might make someone trip, such as rocks or tools.  Regularly check to see if handrails are loose or broken. Make sure that both sides of any steps have handrails.  Any raised decks and porches should have guardrails on the edges.  Have any leaves, snow, or ice cleared regularly.  Use sand or salt on walking paths during winter.  Clean up any spills in your garage right away. This includes oil or grease spills. What can I do in the bathroom?  Use night lights.  Install grab bars by the toilet and in the tub and shower. Do not use towel bars as grab bars.  Use non-skid mats or decals in the tub or shower.  If you need to sit down in the shower, use a plastic, non-slip stool.  Keep the floor dry. Clean up any water that spills on the floor as soon as it happens.  Remove soap buildup in the tub or shower regularly.  Attach bath mats securely with double-sided non-slip rug tape.  Do not have throw rugs and other things on  the floor that can make you trip. What can I do in the bedroom?  Use night lights.  Make sure that you have a light by your bed that is easy to reach.  Do not use any sheets or blankets that are too big for your bed. They should not hang down onto the floor.  Have a firm chair that has side arms. You can use this for support while you get dressed.  Do not have throw rugs and other things on the floor that can make you trip. What can I do in the kitchen?  Clean up any spills right away.  Avoid walking on wet floors.  Keep items that you use a lot in easy-to-reach places.  If you need to reach something above you, use a strong step stool that has a grab bar.  Keep electrical cords out of the way.  Do not use floor polish or wax that makes floors slippery. If you must use wax, use non-skid floor wax.  Do not have throw rugs and other things on the floor that can make you trip. What can I do with my stairs?  Do not leave any items  on the stairs.  Make sure that there are handrails on both sides of the stairs and use them. Fix handrails that are broken or loose. Make sure that handrails are as long as the stairways.  Check any carpeting to make sure that it is firmly attached to the stairs. Fix any carpet that is loose or worn.  Avoid having throw rugs at the top or bottom of the stairs. If you do have throw rugs, attach them to the floor with carpet tape.  Make sure that you have a light switch at the top of the stairs and the bottom of the stairs. If you do not have them, ask someone to add them for you. What else can I do to help prevent falls?  Wear shoes that:  Do not have high heels.  Have rubber bottoms.  Are comfortable and fit you well.  Are closed at the toe. Do not wear sandals.  If you use a stepladder:  Make sure that it is fully opened. Do not climb a closed stepladder.  Make sure that both sides of the stepladder are locked into place.  Ask someone to  hold it for you, if possible.  Clearly mark and make sure that you can see:  Any grab bars or handrails.  First and last steps.  Where the edge of each step is.  Use tools that help you move around (mobility aids) if they are needed. These include:  Canes.  Walkers.  Scooters.  Crutches.  Turn on the lights when you go into a dark area. Replace any light bulbs as soon as they burn out.  Set up your furniture so you have a clear path. Avoid moving your furniture around.  If any of your floors are uneven, fix them.  If there are any pets around you, be aware of where they are.  Review your medicines with your doctor. Some medicines can make you feel dizzy. This can increase your chance of falling. Ask your doctor what other things that you can do to help prevent falls. This information is not intended to replace advice given to you by your health care provider. Make sure you discuss any questions you have with your health care provider. Document Released: 12/14/2008 Document Revised: 07/26/2015 Document Reviewed: 03/24/2014 Elsevier Interactive Patient Education  2017 [place], [place] por tomarse el tiempo para asistir a su Visita de Therapist, sports de [place]. Agradezco su compromiso continuo con sus objetivos de [place]. Revise el siguiente plan que discutimos y avseme si puedo ayudarlo en el futuro.  Recomendaciones / referencias de deteccin: Colonoscopia: 2018, hasta 2028 Visita de oftalmologa / optometra anual recomendada para la deteccin y el control de glaucoma Visita dental anual recomendada para higiene y chequeo.  VacunasEdward Jolly contra la influenza: Monica Becton antineumoccica: Monica Becton Tdap: Monica Becton contra el herpes zster: elegible para shingrix Covid-19: completado   Condiciones / riesgos identificados: diabtico, analizado por el equipo de gestin de cuidados crnicos  Prxima cita: seguimiento en un ao  de su visita anual de bienestar  Atencin preventiva 58 aos o ms, hombre La atencin preventiva se refiere a opciones de estilo de vida y visitas a su proveedor de atencin mdica que pueden promover la salud y Musician. Qu incluye la atencin preventiva? Un examen fsico anual. A esto tambin se le llama una revisin de rutina anual. Exmenes dentales una o dos veces al ao. Exmenes de la vista de rutina. Pregntele a su proveedor  de atencin mdica con qu frecuencia debe hacerse un examen de la vista. Opciones de estilo de vida personal, que incluyen: Cuidado diario de tus dientes y encas. Actividad fsica regular. Llevar una dieta saludable. Evitar el consumo de tabaco y drogas. Limitar el consumo de alcohol. Practicar sexo seguro. Tomar dosis bajas de [employer]. Tomar suplementos de vitaminas y minerales segn lo recomendado por su proveedor de Geophysical data processor. Qu sucede durante una revisin de rutina anual? Los servicios y las pruebas de deteccin que realice su proveedor de atencin mdica durante su control de rutina anual dependern de su edad, estado general de salud, factores de riesgo del estilo de vida y antecedentes familiares de enfermedades. Asesoramiento Su proveedor de atencin mdica puede hacerle preguntas sobre su: Consumo de alcohol. El consumo de tabaco. El consumo de drogas. El bienestar emocional. Bienestar en el hogar y las [place]. Actividad sexual. Hbitos alimenticios. Historia de cadas. Memoria y capacidad de comprensin (cognicin). Ambiente laboral y laboral. Ship broker en pantalla Es posible que le realicen las siguientes pruebas o mediciones: Suzanna Obey e [facility] Presin sangunea. Niveles de lpidos y colesterol. Estos pueden revisarse cada 5 aos, o con mayor frecuencia si tiene ms de 50 aos. Chequeo de piel. Examen de deteccin de cncer de pulmn. Puede realizarse [employer] todos los aos a partir de los 20 aos si  tiene un historial de tabaquismo de 49 paquetes-ao y actualmente fuma o ha dejado de fumar en los ltimos 15 aos. Prueba de sangre oculta en heces (FOBT) de las heces. Puede realizarse esta prueba todos los aos a partir de los 40 aos. Sigmoidoscopia flexible o colonoscopia. Es posible que le realicen una sigmoidoscopia cada 5 aos o una colonoscopia cada 10 aos a partir de los 70 aos. Examen de deteccin de cncer de prstata. Las recomendaciones variarn segn su historial familiar y otros riesgos. Anlisis de sangre de hepatitis C. Anlisis de sangre de hepatitis B. Pruebas de enfermedades de transmisin sexual (ETS). Examen de deteccin de diabetes. Esto se hace controlando su nivel de azcar en sangre (glucosa) despus de no haber comido durante un tiempo (en ayunas). Es posible que le hagan esto cada 1 a 3 aos. Examen de deteccin de aneurisma artico abdominal (AAA). Es posible que lo necesite si es fumador actual o exfumador. Osteoporosis. Es posible que Electrical engineer pruebas de deteccin a Proofreader de los 14 aos si tiene un Public affairs consultant. Hable con su proveedor de atencin [employer] de sus pruebas, las opciones de tratamiento y, si es necesario, la necesidad de Optometrist ms [place]. Vacunas Su proveedor de atencin mdica puede recomendar ciertas vacunas, como: Vacuna contra la influenza. Esto se recomienda [employer]. Vacuna contra el ttanos, la difteria y la tos Dietitian (Tdap, Td). Es posible que necesite una dosis de refuerzo de Td cada 10 aos. Vacuna contra el herpes zster. Es posible que lo necesite despus de los 45 aos. Vacuna antineumoccica conjugada 13-valente (PCV13). Se recomienda una dosis despus de los 30 aos. Vacuna antineumoccica de polisacridos (PPSV23). Se recomienda una dosis despus de los 59 aos. Hable con su proveedor de atencin [employer] pruebas de deteccin y las vacunas que necesita y con qu frecuencia las  necesita. Esta informacin no reemplaza los consejos que le haya dado su proveedor de Geophysical data processor. Asegrese de discutir cualquier pregunta que tenga con su proveedor de Geophysical data processor. Documento publicado: [id number] Documento revisado: 08/10/2015 Documento revisado: 18/12/2014 Educacin interactiva para pacientes de  Elsevier  2017 [place] en el hogar Las cadas pueden provocar lesiones. Le pueden pasar a personas de [employer]. Hay muchas cosas que puede hacer para que su hogar sea seguro y para ayudar a prevenir cadas. Qu puedo hacer en el exterior de mi casa? Arregle regularmente los bordes de las aceras y caminos de [place] y arregle las grietas. Quite todo lo que pueda hacerle tropezar al [employer] puerta, como un escaln o un umbral elevado. Pode los arbustos o rboles en el camino a su casa. Utilice iluminacin exterior brillante. Despeje los senderos de cualquier cosa que pueda hacer tropezar a alguien, como piedras o herramientas. Revise regularmente para ver si los pasamanos estn sueltos o rotos. Asegrese de que ambos lados de los escalones tengan pasamanos. Cualquier terraza y porche elevados deben tener barandillas en los bordes. Limpie las hojas, la nieve o el hielo con regularidad. Use arena o sal al caminar

## 2019-05-24 NOTE — Progress Notes (Signed)
Subjective:   Ross Riley is a 69 y.o. male who presents for Medicare Annual/Subsequent preventive examination.  Review of Systems:   Cardiac Risk Factors include: advanced age (>75mn, >>64women);male gender;diabetes mellitus;dyslipidemia;hypertension     Objective:    Vitals: BP (!) 158/76 (BP Location: Left Arm, Patient Position: Sitting, Cuff Size: Normal)   Pulse (!) 58   Temp 97.8 F (36.6 C) (Temporal)   Ht 5' 6" (1.676 m)   Wt 172 lb 3.2 oz (78.1 kg)   BMI 27.79 kg/m   Body mass index is 27.79 kg/m.  Advanced Directives 05/24/2019 04/13/2018 04/07/2017 05/20/2016 12/06/2014 11/27/2014  Does Patient Have a Medical Advance Directive? _0  No  Would patient like information on creating a medical advance directive? - Yes (MAU/Ambulatory/Procedural Areas - Information given) No - Patient declined No - Patient declined No - patient declined information Yes - Educational materials given    Tobacco Social History   Tobacco Use  Smoking Status Former Smoker  . Packs/day: 1.00  . Years: 32.00  . Pack years: 32.00  . Types: Cigarettes  . Quit date: 03/04/1991  . Years since quitting: 28.2  Smokeless Tobacco Former UEngineer, structuralgiven: Not Answered   Clinical Intake:     Pain : 0-10 Pain Score: 3  Pain Type: Chronic pain Pain Location: Back Pain Orientation: Right, Lower Pain Descriptors / Indicators: Aching     Nutritional Status: BMI 25 -29 Overweight Nutritional Risks: None Diabetes: Yes CBG done?: No Did pt. bring in CBG monitor from home?: No  How often do you need to have someone help you when you read instructions, pamphlets, or other written materials from your doctor or pharmacy?: 1 - Never  Nutrition Risk Assessment:  Has the patient had any N/V/D within the last 2 months?  No  Does the patient have any non-healing wounds?  No  Has the patient had any unintentional weight loss or weight gain?  No   Diabetes:  Is the patient  diabetic?  Yes  If diabetic, was a CBG obtained today?  Yes  Did the patient bring in their glucometer from home?  No  How often do you monitor your CBG's? Daily .   Financial Strains and Diabetes Management:  Are you having any financial strains with the device, your supplies or your medication? No .  Does the patient want to be seen by Chronic Care Management for management of their diabetes?  Yes  Would the patient like to be referred to a Nutritionist or for Diabetic Management?  Yes   Diabetic Exams:  Diabetic Eye Exam: Completed 09/27/2018  Diabetic Foot Exam: Completed 04/08/2019  Interpreter Needed?: Yes Interpreter Agency: pacifiic interpretor Interpreter Name: louis Interpreter ID: [id number] Information entered by :: Alaric Gladwin,LPN  Past Medical History:  Diagnosis Date  . Arthritis   . BPH (benign prostatic hyperplasia)   . Chronic kidney disease    kidney stones  . Dystonia   . ED (erectile dysfunction)   . GERD (gastroesophageal reflux disease)   . Glaucoma (increased eye pressure)   . Hematuria   . Hemorrhoids   . Hyperlipidemia   . Hypertension   . Thyroid disease    Past Surgical History:  Procedure Laterality Date  . CATARACT EXTRACTION Bilateral   . EXTRACORPOREAL SHOCK WAVE LITHOTRIPSY Right   . JOINT REPLACEMENT Left 08/2016   left knee, [place] 2009  . PROSTATE  SURGERY  2011  . TRANSURETHRAL RESECTION OF PROSTATE N/A 12/06/2014   Procedure: TRANSURETHRAL RESECTION OF THE PROSTATE (TURP);  Surgeon: Nickie Retort, MD;  Location: [facility] ORS;  Service: Urology;  Laterality: N/A;   Family History  Problem Relation Age of Onset  . Heart disease Mother    Social History   Socioeconomic History  . Marital status: Married    Spouse name: Not on file  . Number of children: Not on file  . Years of education: Not on file  . Highest education level: 9th grade  Occupational History  . Occupation: retired  Tobacco Use  .  Smoking status: Former Smoker    Packs/day: 1.00    Years: 32.00    Pack years: 32.00    Types: Cigarettes    Quit date: 03/04/1991    Years since quitting: 28.2  . Smokeless tobacco: Former Network engineer and Sexual Activity  . Alcohol use: Yes    Alcohol/week: 0.0 standard drinks    Comment: occasional  . Drug use: No  . Sexual activity: Yes    Birth control/protection: None  Other Topics Concern  . Not on file  Social History Narrative  . Not on file   Social Determinants of Health   Financial Resource Strain:   . Difficulty of Paying Living Expenses:   Food Insecurity:   . Worried About Charity fundraiser in the Last Year:   . Arboriculturist in the Last Year:   Transportation Needs:   . Film/video editor (Medical):   Marland Kitchen Lack of Transportation (Non-Medical):   Physical Activity:   . Days of Exercise per Week:   . Minutes of Exercise per Session:   Stress:   . Feeling of Stress :   Social Connections:   . Frequency of Communication with Friends and Family:   . Frequency of Social Gatherings with Friends and Family:   . Attends Religious Services:   . Active Member of Clubs or Organizations:   . Attends Archivist Meetings:   Marland Kitchen Marital Status:     Outpatient Encounter Medications as of 05/24/2019  Medication Sig  . ACCU-CHEK FASTCLIX LANCETS MISC check blood sugar up to 1 time daily as directed  . Alcohol Swabs (B-D SINGLE USE SWABS REGULAR) PADS check blood sugar up to 1 time daily as directed  . aspirin EC 81 MG tablet Take by mouth.  Marland Kitchen atorvastatin (LIPITOR) 20 MG tablet TAKE 1 TABLET(20 MG) BY MOUTH DAILY  . benazepril-hydrochlorthiazide (LOTENSIN HCT) 10-12.5 MG tablet TAKE 1 TABLET BY MOUTH DAILY  . Blood Glucose Monitoring Suppl (ACCU-CHEK AVIVA PLUS) w/Device KIT Use device to check blood sugar up to 1 time daily as directed  . Cholecalciferol (VITAMIN D3) 125 MCG (5000 UT) CAPS Take by mouth.  . dorzolamide-timolol (COSOPT) 22.3-6.8 MG/ML  ophthalmic solution dorzolamide 22.3 mg-timolol 6.8 mg/mL eye drops  . glipiZIDE (GLUCOTROL XL) 5 MG 24 hr tablet TAKE 1 TABLET(5 MG) BY MOUTH DAILY WITH BREAKFAST  . glucose blood test strip check blood sugar up to 1 time daily as directed  . latanoprost (XALATAN) 0.005 % ophthalmic solution INT 1 GTT INTO OU QHS  . levothyroxine (SYNTHROID) 75 MCG tablet Take 50 mcg by mouth.   . metoprolol tartrate (LOPRESSOR) 25 MG tablet TAKE 1/2 TABLET(12.5 MG) BY MOUTH TWICE DAILY  . naproxen (NAPROSYN) 500 MG tablet Take 1 tablet (500 mg total) by mouth 2 (two) times daily with a meal. For 2-4 weeks  then as needed  . nortriptyline (PAMELOR) 10 MG capsule take 30 mg nightly for one week, then increase to 40 mg nightly  . omeprazole (PRILOSEC) 40 MG capsule TAKE 1 CAPSULE BY MOUTH DAILY BEFORE BREAKFAST 15 TO 30 MINUTES BEFORE FIRST MEAL OF THE DAY  . sildenafil (VIAGRA) 100 MG tablet Take 1 tablet (100 mg total) by mouth daily as needed for erectile dysfunction. Take two hours prior to intercourse on an empty stomach (Patient not taking: Reported on 05/24/2019)  . [DISCONTINUED] brimonidine (ALPHAGAN) 0.2 % ophthalmic solution brimonidine 0.2 % eye drops  INSTILL 1 DROP INTO OU TID  . [DISCONTINUED] carbidopa-levodopa (SINEMET IR) 25-100 MG tablet Take 1 tablet by mouth 3 (three) times daily.  . [DISCONTINUED] dicyclomine (BENTYL) 10 MG capsule Take 1 capsule (10 mg total) by mouth 4 (four) times daily -  before meals and at bedtime. As needed for abdominal cramping (Patient not taking: Reported on 05/24/2019)  . [DISCONTINUED] dorzolamide (TRUSOPT) 2 % ophthalmic solution Apply to eye.   No facility-administered encounter medications on file as of 05/24/2019.    Activities of Daily Living In your present state of health, do you have any difficulty performing the following activities: 05/24/2019 05/10/2019  Hearing? N N  Comment no hearing aids -  Vision? N N  Comment blurry vision with glasses on. goes to  Dr.Thurmond. has eye drops. has glaucoma. -  Difficulty concentrating or making decisions? N N  Walking or climbing stairs? Y N  Dressing or bathing? N N  Doing errands, shopping? N N  Preparing Food and eating ? N -  Using the Toilet? N -  In the past six months, have you accidently leaked urine? N -  Do you have problems with loss of bowel control? N -  Managing your Medications? N -  Managing your Finances? N -  Housekeeping or managing your Housekeeping? N -  Some recent data might be hidden    Patient Care Team: Olin Hauser, DO as PCP - General (Family Medicine) Ardelle Lesches, MD as Referring Physician (Internal Medicine)   Assessment:   This is a routine wellness examination for [facility].  Exercise Activities and Dietary recommendations Current Exercise Habits: The patient does not participate in regular exercise at present, Exercise limited by: None identified  Goals Addressed   None     Fall Risk Fall Risk  05/24/2019 11/23/2018 11/23/2018 11/05/2018 04/21/2018  Falls in the past year? 0 0 0 0 0  Number falls in past yr: 0 0 0 - -  Injury with Fall? 0 0 0 - -  Follow up - - - Falls evaluation completed Falls evaluation completed   [place]:  Any stairs in or around the home? Yes  If so, are there any without handrails? No   Home free of loose throw rugs in walkways, pet beds, electrical cords, etc? Yes  Adequate lighting in your home to reduce risk of falls? Yes   ASSISTIVE DEVICES UTILIZED TO PREVENT FALLS:  Life alert? No  Use of a cane, walker or w/c? Yes  Grab bars in the bathroom? no Shower chair or bench in shower? No  Elevated toilet seat or a handicapped toilet? No    TIMED UP AND GO:  Unable to perform    Depression Screen PHQ 2/9 Scores 05/24/2019 11/05/2018 09/20/2018 04/21/2018  PHQ - 2 Score 0 0 0 0  PHQ- 9 Score - - - -    Cognitive  Function        Immunization History  Administered Date(s)  Administered  . Fluad Quad(high Dose 65+) 11/05/2018  . Influenza, High Dose Seasonal PF 11/24/2016  . Influenza,inj,Quad PF,6+ Mos 12/15/2012, 12/07/2014  . Influenza-Unspecified 12/07/2014, 02/13/2016, 12/02/2017  . PFIZER SARS-COV-2 Vaccination 04/25/2019, 05/16/2019  . Pneumococcal Conjugate-13 04/07/2017  . Pneumococcal Polysaccharide-23 04/25/2013, 04/13/2018  . Tdap 12/15/2012  . Zoster Recombinat (Shingrix) 06/22/2016, 04/15/2018    Qualifies for Shingles Vaccine? Yes  shingrix completed   Tdap: up to date   Flu Vaccine: up to date   Pneumococcal Vaccine: up to date   Covid-19 Vaccine: Completed vaccines  Screening Tests Health Maintenance  Topic Date Due  . OPHTHALMOLOGY EXAM  09/27/2019  . HEMOGLOBIN A1C  10/06/2019  . FOOT EXAM  04/07/2020  . TETANUS/TDAP  12/16/2022  . COLONOSCOPY  02/07/2027  . INFLUENZA VACCINE  Completed  . Hepatitis C Screening  Completed  . PNA vac Low Risk Adult  Completed   Cancer Screenings:  Colorectal Screening: Completed 02/06/2017, due in 10 years   Lung Cancer Screening: (Low Dose CT Chest recommended if Age 62-80 years, 30 pack-year currently smoking OR have quit w/in 15years.) does not qualify.     Additional Screening:  Hepatitis C Screening: does qualify; Completed 2016  Dental Screening: Recommended annual dental exams for proper oral hygiene  Community Resource Referral:  CRR required this visit?  No        Plan:  I have personally reviewed and addressed the Medicare Annual Wellness questionnaire and have noted the following in the patient's chart:  A. Medical and social history B. Use of alcohol, tobacco or illicit drugs  C. Current medications and supplements D. Functional ability and status E.  Nutritional status F.  Physical activity G. Advance directives H. List of other physicians I.  Hospitalizations, surgeries, and ER visits in previous 12 months J.  [place] such as hearing and vision  if needed, cognitive and depression L. Referrals and appointments   In addition, I have reviewed and discussed with patient certain preventive protocols, quality metrics, and best practice recommendations. A written personalized care plan for preventive services as well as general preventive health recommendations were provided to patient.   Signed,   Bevelyn Ngo, LPN  5/36/9223 Nurse Health Advisor  Nurse Notes: none

## 2019-05-25 DIAGNOSIS — H401132 Primary open-angle glaucoma, bilateral, moderate stage: Secondary | ICD-10-CM | POA: Diagnosis not present

## 2019-05-25 DIAGNOSIS — H26493 Other secondary cataract, bilateral: Secondary | ICD-10-CM | POA: Diagnosis not present

## 2019-05-26 ENCOUNTER — Other Ambulatory Visit: Payer: Self-pay

## 2019-05-26 ENCOUNTER — Ambulatory Visit (INDEPENDENT_AMBULATORY_CARE_PROVIDER_SITE_OTHER): Payer: Medicare Other | Admitting: General Surgery

## 2019-05-26 ENCOUNTER — Encounter: Payer: Self-pay | Admitting: General Surgery

## 2019-05-26 DIAGNOSIS — L720 Epidermal cyst: Secondary | ICD-10-CM

## 2019-05-26 NOTE — Progress Notes (Signed)
Sebaceous Cyst Excision Procedure Note  Pre-operative Diagnosis: Epidermal cyst  Post-operative Diagnosis: same  Locations:upper back  Indications: chronic sebaceous cyst, growing  Anesthesia: Lidocaine 1% with epinephrine without added sodium bicarbonate  Procedure Details  History of allergy to iodine: no  Patient informed of the risks (including bleeding and infection) and benefits of the  procedure and Written informed consent obtained.  The lesion and surrounding area was given a sterile prep using chlorhexidine and draped in the usual sterile fashion. An incision was made over the cyst, which was dissected free of the surrounding tissue and removed.  The cyst was filled with typical sebaceous material. It was 1.5 cm in greatest dimension.  The wound was closed with 3-0 Vicryl using simple interrupted stitches and a subcuticular 4-0 Monocryl. A sterile dressing was applied.  The specimen was not sent for pathologic examination. The patient tolerated the procedure well.  EBL: <1 ml  Findings: Scarring consistent with prior attempted I&D.  Condition: Stable  Complications: none.  Plan: 1. Instructed to keep the wound dry and covered for 24-48h and clean thereafter. 2. Warning signs of infection were reviewed.   3. Recommended that the patient use OTC analgesics as needed for pain.  4. Return for wound check in 2 weeks.

## 2019-05-26 NOTE — Patient Instructions (Addendum)
Dr.Cannon instructed patient to take Tylenol (2 tablets) every six hours as needed or Ibuprofen (2-3 tablets) every three hours as needed for pain. Patient may alternate the Tylenol or Ibuprofen every three hours. Patient may use ice pack or cold compress to help with discomfort or pain. Patient will follow up in two weeks.  If patient feels he doe not need the appointment, give our office a call to cancel scheduled appointment. Patient may take a shower tomorrow, but do not submerge this wound.    Exciso de leses cutneas, cuidados aps o procedimento Excision of Skin Lesions, Care After Este folheto oferece informaes sobre como cuidar de si aps o seu procedimento. Seu mdico tambm poder fornecer instrues mais especficas. Caso tenha problemas ou perguntas, entre em contato com o seu mdico. O que posso esperar aps o procedimento? Aps o procedimento,  comum sentir dor ou desconforto no local da exciso. Siga essas instrues em casa: Programmer, systems [employer] as instrues do seu mdico sobre como cuidar do local de exciso. Certifique-se de: ? Lavar as mos com gua e sabo antes e depois de trocar sua gaze (curativo). Caso gua e sabo no estejam disponveis, use gel antissptico para as mos. ? Troque o curativo como indicado pelo seu mdico. ? No remova os pontos (suturas), a cola cirrgica ou as faixas adesivas. Pode ser necessrio deixar esses fechamentos de ferida no lugar por 2 semanas ou mais. Caso as [employer] comecem a se soltar ou dobrar, voc pode cortar as extremidades soltas. No remova faixas adesivas completamente a menos que seu mdico lhe diga para fazer isso.  Examine o local da exciso todos os dias, verificando se h sinais de infeco. Fique atento em relao a: ? Vermelhido, inchao ou dor. ? Lquido ou sangue. ? Calor. ? Pus ou mau cheiro.  Mantenha o local limpo, seco e protegido por pelo menos 48 horas.  Em caso de  sangramento, aplique uma presso [place], porm [place], na rea usando uma toalha dobrada por 20 minutos.  Evite exerccios e atividades de alto impacto at que as suturas sejam tiradas ou at a Printmaker. Instrues gerais  Tome medicamentos vendidos com ou sem receita mdica somente de acordo com as indicaes do seu mdico.  Siga as instrues do seu mdico sobre como minimizar cicatrizes. A cicatriz deve diminuir com o tempo.  Evite exposio ao sol at que a rea tenha cicatrizado. Use protetor solar para proteger a rea do sol depois de ter cicatrizado.  Comparea a todas as consultas de acompanhamento de acordo com as orientaes do seu mdico. Isso  importante. Entre em contato com um mdico se:  Administrator, Civil Service vermelhido, inchao ou dor em torno do Scientist, research (physical sciences) exciso.  Notar lquido ou sangue saindo do local da exciso.  O local da exciso estiver quente ao toque.  Houver pus ou mau cheiro saindo do local da exciso.  Tiver febre.  Sentir dor e no melhorar em 2-3 dias aps o procedimento.  Notar irregularidades na pele ou alteraes na sensao (sensibilidade). Resumo  Este folheto de instrues fornece informaes gerais sobre como cuidar de si aps o procedimento. Entre em contato com seu mdico caso tenha qualquer problema ou dvida.  Tome medicamentos vendidos com ou sem receita mdica somente de acordo com as indicaes do seu mdico.  Troque o curativo como indicado pelo seu mdico.  Entre em contato com um mdico caso apresente vermelhido, inchao, dor ou outros sinais de infeco em torno  do local da exciso.  Comparea a todas as consultas de acompanhamento de acordo com as orientaes do seu mdico. Isso  importante. Estas informaes no se destinam a substituir as recomendaes de seu mdico. No deixe de discutir quaisquer dvidas com seu mdico. Document Revised: 09/24/2017 Document Reviewed: 09/24/2017 Elsevier Patient Education  2020 [employer].

## 2019-05-31 ENCOUNTER — Ambulatory Visit: Payer: Medicare Other | Admitting: Physical Therapy

## 2019-05-31 ENCOUNTER — Other Ambulatory Visit: Payer: Self-pay

## 2019-05-31 ENCOUNTER — Encounter: Payer: Self-pay | Admitting: Physical Therapy

## 2019-05-31 DIAGNOSIS — G8929 Other chronic pain: Secondary | ICD-10-CM

## 2019-05-31 DIAGNOSIS — R269 Unspecified abnormalities of gait and mobility: Secondary | ICD-10-CM

## 2019-05-31 DIAGNOSIS — M25672 Stiffness of left ankle, not elsewhere classified: Secondary | ICD-10-CM

## 2019-05-31 DIAGNOSIS — M6281 Muscle weakness (generalized): Secondary | ICD-10-CM

## 2019-05-31 DIAGNOSIS — M25472 Effusion, left ankle: Secondary | ICD-10-CM

## 2019-05-31 NOTE — Therapy (Signed)
[place] [facility] [address] [place], [place], [address] Phone: [phone]   Fax:  [phone]  Physical Therapy Treatment  Patient Details  Name: Ross Riley MRN: [id number] Date of Birth: 08-20-1950 Referring Provider (PT): Dr. Melrose Nakayama   Encounter Date: 05/31/2019  PT End of Session - 05/31/19 1455    Visit Number  9    Number of Visits  14    Date for PT Re-Evaluation  05/26/19    Authorization - Visit Number  3    Authorization - Number of Visits  10    PT Start Time  [id number]    PT Stop Time  1436    PT Time Calculation (min)  12 min    Activity Tolerance  No increased pain    Behavior During Therapy  [facility] for tasks assessed/performed       Past Medical History:  Diagnosis Date  . Arthritis   . BPH (benign prostatic hyperplasia)   . Chronic kidney disease    kidney stones  . Dystonia   . ED (erectile dysfunction)   . GERD (gastroesophageal reflux disease)   . Glaucoma (increased eye pressure)   . Hematuria   . Hemorrhoids   . Hyperlipidemia   . Hypertension   . Thyroid disease     Past Surgical History:  Procedure Laterality Date  . CATARACT EXTRACTION Bilateral   . EXTRACORPOREAL SHOCK WAVE LITHOTRIPSY Right   . JOINT REPLACEMENT Left 08/2016   left knee, [place]  2009  . PROSTATE SURGERY  2011  . TRANSURETHRAL RESECTION OF PROSTATE N/A 12/06/2014   Procedure: TRANSURETHRAL RESECTION OF THE PROSTATE (TURP);  Surgeon: Nickie Retort, MD;  Location: [facility] ORS;  Service: Urology;  Laterality: N/A;    There were no vitals filed for this visit.  Subjective Assessment - 05/31/19 1451    Subjective  Pt. arrived to PT with new custom shoes.  Pt. states his foot pain has been doing much better.    Currently in Pain?  No/denies        Reviewed goals/ assessed walking in new custom shoes.  No c/o foot pain.  Discharge.      PT Long Term Goals - 05/31/19 1457      PT LONG TERM GOAL  #1   Title  Pt. will be able to ambulate with use of a SPC for >7 min with no reports of increased pain in L lateral ankle.    Baseline  Pt. can not walk for more than 1 min without a cane due to increased pain in his L foot.  On2/25: marked increase in gait pattern with cuing for consistent heel strike with use of SPC    Time  4    Period  Weeks    Status  Achieved    Target Date  05/31/19      PT LONG TERM GOAL #2   Title  Pt. will score 48 in FOTO to indicate increase LE mobility function    Baseline  initial score: 19 on 03/30/2019.  3/30: 65 (PT had to assist with translation/ answering questions)- no interpreter for discharge.    Time  4    Period  Weeks    Status  Achieved    Target Date  05/31/19      PT LONG TERM GOAL #3   Title  Pt. independent with HEP to increase bilateral PF strength to 5/5 MMT to improve push  off phase during gait and increase ambulation efficiency.    Baseline  Pt. is able to complete 15 heels raise with both heels up but is limited by L anterior ankle pain    Time  4    Period  Weeks    Status  Unable to assess    Target Date  05/31/19      PT LONG TERM GOAL #4   Title  Pt. will be able to increase B DF AROM to 10 deg to improve heel strike and decrease fall risks during gait.    Baseline  AROM DF (R: 3 deg, L: 1 deg.)    Time  4    Period  Weeks    Status  Unable to assess    Target Date  05/31/19            Plan - 05/31/19 1456    Clinical Impression Statement  No PT tx. or charges today.  PT assessed gait pattern while wearing new custom supportive shoes.  No c/o L foot pain.  Pt. understands HEP and instructed to contact PT if any increase in pain symptoms.  Discharge from PT at this time.    Examination-Activity Limitations  Bed Mobility;Lift;Squat;Stairs;Toileting;Stand;Dressing;Bathing    Stability/Clinical Decision Making  Evolving/Moderate complexity    Clinical Decision Making  Moderate    Rehab Potential  Fair    PT Frequency  2x  / week    PT Duration  4 weeks    PT Treatment/Interventions  ADLs/Self Care Home Management;Cryotherapy;Software engineer;Therapeutic activities;Stair training;Therapeutic exercise;Balance training;Neuromuscular re-education;Patient/family education;Manual techniques;Passive range of motion;Energy conservation;Orthotic Fit/Training    PT Next Visit Plan  Discharge    PT Home Exercise Plan  Access Code: [email or web address]    Consulted and Agree with Plan of Care  Patient       Patient will benefit from skilled therapeutic intervention in order to improve the following deficits and impairments:  Abnormal gait, Decreased balance, Decreased endurance, Decreased mobility, Difficulty walking, Hypomobility, Decreased range of motion, Increased edema, Impaired tone, Improper body mechanics, Decreased activity tolerance, Decreased strength, Impaired flexibility, Pain  Visit Diagnosis: Chronic foot pain, left  Gait difficulty  Ankle joint stiffness, left  Muscle weakness (generalized)  Left ankle swelling     Problem List Patient Active Problem List   Diagnosis Date Noted  . Erectile dysfunction due to arterial insufficiency 09/14/2018  . Abnormal ejaculation 09/14/2018  . Former smoker 07/08/2017  . Hypothyroidism 07/08/2017  . Hx of adenomatous colonic polyps 06/09/2017  . History of diverticulitis 06/09/2017  . History of Clostridium difficile colitis 06/09/2017  . Recurrent colitis due to Clostridium difficile 03/25/2017  . Chills (without fever) 03/25/2017  . Diverticulitis 12/04/2016  . Anemia 12/04/2016  . Chronic right shoulder pain 11/24/2016  . Other constipation 11/24/2016  . BPPV (benign paroxysmal positional vertigo), bilateral 10/09/2016  . Knee stiffness 10/03/2016  . S/P TKR (total knee replacement), left 09/22/2016  . Osteoarthritis of left knee 06/05/2016  . Atypical intracranial meningioma ([place]) 05/22/2016  . Abdominal pain, LLQ (left lower  quadrant) 02/11/2016  . Seasonal allergies 01/24/2015  . BPH (benign prostatic hyperplasia) 12/06/2014  . Pain in joint, ankle and foot 10/26/2014  . Gonalgia 10/26/2014  . Osteoarthritis of multiple joints 10/26/2014  . Bilateral cataracts 10/26/2014  . ED (erectile dysfunction) of organic origin 10/26/2014  . Acid reflux 10/26/2014  . Hyperlipidemia associated with type 2 diabetes mellitus ([place]) 10/26/2014  . Essential hypertension 10/26/2014  . Type 2 diabetes  mellitus with diabetic cataract, without long-term current use of insulin ([place]) 08/02/2014  . Focal dystonia 07/22/2012   Pura Spice, PT, DPT # [id number] 06/01/2019, 3:21 PM  [place] [facility] [address] [place], [place], [address] Phone: [phone]   Fax:  [phone]  Name: Ross Riley MRN: [id number] Date of Birth: 1950/11/19

## 2019-06-01 ENCOUNTER — Other Ambulatory Visit: Payer: Self-pay

## 2019-06-01 ENCOUNTER — Encounter: Payer: Self-pay | Admitting: Family Medicine

## 2019-06-01 ENCOUNTER — Ambulatory Visit (INDEPENDENT_AMBULATORY_CARE_PROVIDER_SITE_OTHER): Payer: Medicare Other | Admitting: Family Medicine

## 2019-06-01 VITALS — BP 126/65 | HR 56 | Temp 97.8°F | Ht 66.0 in | Wt 172.0 lb

## 2019-06-01 DIAGNOSIS — L732 Hidradenitis suppurativa: Secondary | ICD-10-CM | POA: Diagnosis not present

## 2019-06-01 MED ORDER — DOXYCYCLINE HYCLATE 100 MG PO TABS: 100.0000 mg | ORAL_TABLET | Freq: Two times a day (BID) | ORAL | 0 refills | Status: DC

## 2019-06-01 MED ORDER — MUPIROCIN 2 % EX OINT: 1.0000 "application " | TOPICAL_OINTMENT | Freq: Two times a day (BID) | CUTANEOUS | 0 refills | Status: DC | PRN

## 2019-06-01 NOTE — Patient Instructions (Addendum)
Thank you for coming to the office today.  You have bacterial infection under arms  Start taking antibiotic as prescribed, Doxycycline 100mg  twice daily for 10 days, make sure to take with full glass of water and stay upright or seated for 30 min (do not lay down after taking or can cause burning irritation of throat).  Recommend a Probiotic as well.  Recommend warm soapy water soaks in bathtub or with warm compresses/wash cloth several times a day to help heal and may actually drain some pus on its own, which can be normal.   May use topical antibiotic if need as well.  You may need to return soon for re-evaluation if worsening such as spreading redness or streaking redness, significantly larger size, increased pain, fevers/chills, nausea vomiting and cannot take antibiotic. If significantly worse symptoms or most of these symptoms, would recommend going straight to Hospital Emergency Dept as you may require IV antibiotics instead.   IF IT DOES NOT GET BETTER - you can call the surgeons or show them this problem on 06/09/19  [facility] at [facility] Address: [address], [place], [place] [address] Phone: [phone]    Please schedule a Follow-up Appointment to: Return in about 2 weeks (around 06/15/2019), or if symptoms worsen or fail to improve, for abscess / skin infection under arms.  If you have any other questions or concerns, please feel free to call the office or send a message through [place]. You may also schedule an earlier appointment if necessary.  Additionally, you may be receiving a survey about your experience at our office within a few days to 1 week by e-mail or mail. We value your feedback.  [name], DO [place]

## 2019-06-01 NOTE — Progress Notes (Signed)
Subjective:    Patient ID: Ross Riley, male    DOB: 1950-10-25, 69 y.o.   MRN: [id number]  Ross Riley is a 69 y.o. male presenting on 06/01/2019 for Arm Pain (Bump on both sides under arms with redness x1 week. Pt has a little bit of pain with some burning.)  History provided by patient today in [place]. He declined Spanish language interpreter in person or phone.  HPI   Hidradenitis, suppurativa- bilateral axillary Reports new complaint with red swollen bumps under both armpits, L worse than R, has one main area that he said is painful sore burning to touch and slight drainage. Onset about 1 week ago, now getting worse. Not used any topical medications. He said it may have started after using an old deodorant. He has not had this problem in past similar to this. - [facility] A[facility] for excision of epidermal inclusion cyst of back on 05/26/19, has follow up on 06/09/19 Denies any fever chills, spreading redness, other skin infection or abscess.   Depression screen [facility] 2/9 05/24/2019 11/05/2018 09/20/2018  Decreased Interest 0 0 0  Down, Depressed, Hopeless 0 0 0  PHQ - 2 Score 0 0 0  Altered sleeping - - -  Tired, decreased energy - - -  Change in appetite - - -  Feeling bad or failure about yourself  - - -  Trouble concentrating - - -  Moving slowly or fidgety/restless - - -  Suicidal thoughts - - -  PHQ-9 Score - - -  Difficult doing work/chores - - -    Social History   Tobacco Use  . Smoking status: Former Smoker    Packs/day: 1.00    Years: 32.00    Pack years: 32.00    Types: Cigarettes    Quit date: 03/04/1991    Years since quitting: 28.2  . Smokeless tobacco: Former Network engineer Use Topics  . Alcohol use: Yes    Alcohol/week: 0.0 standard drinks    Comment: occasional  . Drug use: No    Review of Systems Per HPI unless specifically indicated above     Objective:    BP 126/65   Pulse (!) 56   Temp 97.8 F  (36.6 C) (Temporal)   Ht 5\' 6"  (1.676 m)   Wt 172 lb (78 kg)   SpO2 100%   BMI 27.76 kg/m   Wt Readings from Last 3 Encounters:  06/01/19 172 lb (78 kg)  05/26/19 171 lb 9.6 oz (77.8 kg)  05/24/19 172 lb 3.2 oz (78.1 kg)    Physical Exam Vitals and nursing note reviewed.  Constitutional:      General: He is not in acute distress.    Appearance: He is well-developed. He is not diaphoretic.     Comments: Well-appearing, comfortable, cooperative  HENT:     Head: Normocephalic and atraumatic.  Eyes:     General:        Right eye: No discharge.        Left eye: No discharge.     Conjunctiva/sclera: Conjunctivae normal.  Cardiovascular:     Rate and Rhythm: Normal rate.  Pulmonary:     Effort: Pulmonary effort is normal.  Skin:    General: Skin is warm and dry.     Findings: Lesion (Right axillary central located with moderate sized 2-3 cm erythematous abscess tender slight open skin pore with some oozing and induration, several smaller scattered locations  within axilla, also R side similar but no main infected lesion) present. No erythema or rash.  Neurological:     Mental Status: He is alert and oriented to person, place, and time.  Psychiatric:        Behavior: Behavior normal.     Comments: Well groomed, good eye contact, normal speech and thoughts    Results for orders placed or performed in visit on 04/08/19  POCT glycosylated hemoglobin (Hb A1C)  Result Value Ref Range   Hemoglobin A1C 6.4 (A) 4.0 - 5.6 %      Assessment & Plan:   Problem List Items Addressed This Visit    None    Visit Diagnoses    Suppurative hidradenitis    -  Primary   Relevant Medications   doxycycline (VIBRA-TABS) 100 MG tablet   mupirocin ointment (BACTROBAN) 2 %      Consistent with new acute L>R axillary abscess with firm induration without surrounding cellulitis or any systemic symptoms. No history of recurrent abcesses in this or other areas However based on exam and multiple  locations within axilla, seems to be consistent with suppurative hidradenitis. No recent antibiotics. Note he has history of C Diff infection in past with diarrhea due to antibiotics. He has not had doxycycline before.  Plan: 1. Start taking Doxycycline antibiotic 100mg  twice daily for 10 days. Take with full glass of water and stay upright for at least 30 min after taking, may be seated or standing, but should NOT lay down. This is just a safety precaution, if this medicine does not go all the way down throat well it could cause some burning discomfort to throat and esophagus. - Samples for Probiotic given w coupon - Rx Mupirocin topical ointment BID PRN 1-2 weeks if needed for now or in future. 2. Warm compresses per handout 3. Return precautions given if worsening  Keep apt w gen surgery on 06/09/19 [facility], if need he may follow-up with them if this becomes a chronic recurrent issue.   Meds ordered this encounter  Medications  . doxycycline (VIBRA-TABS) 100 MG tablet    Sig: Take 1 tablet (100 mg total) by mouth 2 (two) times daily. For 10 days. Take with full glass of water, stay upright 30 min after taking.    Dispense:  20 tablet    Refill:  0  . mupirocin ointment (BACTROBAN) 2 %    Sig: Apply 1 application topically 2 (two) times daily as needed. For up to 1-2 weeks if needed.    Dispense:  30 g    Refill:  0      Follow up plan: Return in about 2 weeks (around 06/15/2019), or if symptoms worsen or fail to improve, for abscess / skin infection under arms.   [name], [facility] 06/01/2019, 2:08 PM

## 2019-06-09 ENCOUNTER — Ambulatory Visit (INDEPENDENT_AMBULATORY_CARE_PROVIDER_SITE_OTHER): Payer: Self-pay | Admitting: General Surgery

## 2019-06-09 ENCOUNTER — Other Ambulatory Visit: Payer: Self-pay

## 2019-06-09 ENCOUNTER — Encounter: Payer: Self-pay | Admitting: General Surgery

## 2019-06-09 VITALS — BP 151/90 | HR 69 | Temp 97.5°F | Ht 66.0 in | Wt 171.0 lb

## 2019-06-09 DIAGNOSIS — L720 Epidermal cyst: Secondary | ICD-10-CM | POA: Insufficient documentation

## 2019-06-09 NOTE — Patient Instructions (Signed)
Dr.Cannon recommends patient to complete his course of Antibiotic treatment.  Follow-up with our office as needed.  Please call and ask to speak with a nurse if you develop questions or concerns.

## 2019-06-09 NOTE — Progress Notes (Signed)
Ross Riley is here today for a post procedure wound check.  I excised a sebaceous cyst from his upper back 2 weeks ago.  He states that he has done well from this procedure.  He did not experience any postprocedural pain.  He denies any fevers or chills.  No nausea or vomiting.  He did recently see his primary care provider for a cyst or abscess in his axilla.  He was prescribed doxycycline and he says that the area is resolving.  Today's Vitals   06/09/19 1404  BP: (!) 151/90  Pulse: 69  Temp: (!) 97.5 F (36.4 C)  TempSrc: Temporal  SpO2: 97%  Weight: 171 lb (77.6 kg)  Height: 5\' 6"  (1.676 m)  PainSc: 0-No pain   Body mass index is 27.6 kg/m. Focused examination of the upper back: The incision is well approximated.  There is still Dermabond present.  This was gently removed with an alcohol wipe.  There is no erythema, induration, or drainage present.    I also examined the site in his left axilla.  The induration and erythema that he says was initially present has resolved considerably.  It is nontender and there is no drainage.  Impression and plan: This is a 69 year old man who had a growing and tender sebaceous cyst on his upper back.  This was excised 2 weeks ago.  The incision is healing nicely.  As far as the lesion in his left axilla, it appears to be responding well to oral antibiotic therapy and no surgical intervention is necessary at this time.  We will see him back on an as-needed basis.

## 2019-06-17 ENCOUNTER — Ambulatory Visit: Payer: Self-pay | Admitting: *Deleted

## 2019-06-17 NOTE — Telephone Encounter (Signed)
Assited by Angela Burke [phone]; pt called with complaints of lower abdominal pain L>R, rated 6 out of 10; he says his pain is constant; he states he had this before when he was 44, and he was hospitalized with a bacteria;  Recommendations made per nurse triage protocol; the pt is seen by Dr Parks Ranger, [place], but there is no availability within timeframe per guidelines; the pt verbalized understanding, and will go to urgent care; will route to office for notification Reason for Disposition . [1] MILD-MODERATE pain AND [2] constant AND [3] present > 2 hours  Answer Assessment - Initial Assessment Questions 1. LOCATION: "Where does it hurt?"      Lower abdomen L>R 2. RADIATION: "Does the pain shoot anywhere else?" (e.g., chest, back)     no 3. ONSET: "When did the pain begin?" (Minutes, hours or days ago)     06/16/19 1900 4. SUDDEN: "Gradual or sudden onset?"     suddenly 5. PATTERN "Does the pain come and go, or is it constant?"    - If constant: "Is it getting better, staying the same, or worsening?"      (Note: Constant means the pain never goes away completely; most serious pain is constant and it progresses)     - If intermittent: "How long does it last?" "Do you have pain now?"     (Note: Intermittent means the pain goes away completely between bouts)   constant 6. SEVERITY: "How bad is the pain?"  (e.g., Scale 1-10; mild, moderate, or severe)    - MILD (1-3): doesn't interfere with normal activities, abdomen soft and not tender to touch     - MODERATE (4-7): interferes with normal activities or awakens from sleep, tender to touch     - SEVERE (8-10): excruciating pain, doubled over, unable to do any normal activities      6 out of 10 7. RECURRENT SYMPTOM: "Have you ever had this type of abdominal pain before?" If so, ask: "When was the last time?" and "What happened that time?"      bacteria 8. CAUSE: "What do you think is causing the abdominal pain?"     *No  Answer* 9. RELIEVING/AGGRAVATING FACTORS: "What makes it better or worse?" (e.g., movement, antacids, bowel movement)     *No Answer* 10. OTHER SYMPTOMS: "Has there been any vomiting, diarrhea, constipation, or urine problems?"       *No Answer*  Protocols used: ABDOMINAL PAIN - MALE-A-AH

## 2019-06-23 DIAGNOSIS — R1310 Dysphagia, unspecified: Secondary | ICD-10-CM | POA: Diagnosis not present

## 2019-06-30 DIAGNOSIS — R1310 Dysphagia, unspecified: Secondary | ICD-10-CM | POA: Diagnosis not present

## 2019-06-30 DIAGNOSIS — E119 Type 2 diabetes mellitus without complications: Secondary | ICD-10-CM | POA: Diagnosis not present

## 2019-06-30 DIAGNOSIS — E782 Mixed hyperlipidemia: Secondary | ICD-10-CM | POA: Diagnosis not present

## 2019-06-30 DIAGNOSIS — E039 Hypothyroidism, unspecified: Secondary | ICD-10-CM | POA: Diagnosis not present

## 2019-06-30 DIAGNOSIS — I10 Essential (primary) hypertension: Secondary | ICD-10-CM | POA: Diagnosis not present

## 2019-07-26 ENCOUNTER — Telehealth: Payer: Self-pay

## 2019-07-26 NOTE — Telephone Encounter (Signed)
A user error has taken place: encounter opened in error, closed for administrative reasons.

## 2019-08-06 ENCOUNTER — Other Ambulatory Visit: Payer: Self-pay | Admitting: Family Medicine

## 2019-08-06 DIAGNOSIS — E1136 Type 2 diabetes mellitus with diabetic cataract: Secondary | ICD-10-CM

## 2019-08-06 NOTE — Telephone Encounter (Signed)
Requested Prescriptions  Pending Prescriptions Disp Refills  . glipiZIDE (GLUCOTROL XL) 5 MG 24 hr tablet [Pharmacy Med Name: GLIPIZIDE ER 5MG  TABLETS] 90 tablet 3    Sig: TAKE 1 TABLET(5 MG) BY MOUTH DAILY WITH BREAKFAST     Endocrinology:  Diabetes - Sulfonylureas Passed - 08/06/2019  7:07 AM      Passed - HBA1C is between 0 and 7.9 and within 180 days    Hemoglobin A1C  Date Value Ref Range Status  04/08/2019 6.4 (A) 4.0 - 5.6 % Final  04/26/2018 6.6  Final    Comment:    [facility] Everywhere   Hgb A1c MFr Bld  Date Value Ref Range Status  10/25/2018 6.1 (H) <5.7 % of total Hgb Final    Comment:    For someone without known diabetes, a hemoglobin  A1c value between 5.7% and 6.4% is consistent with prediabetes and should be confirmed with a  follow-up test. . For someone with known diabetes, a value <7% indicates that their diabetes is well controlled. A1c targets should be individualized based on duration of diabetes, age, comorbid conditions, and other considerations. . This assay result is consistent with an increased risk of diabetes. . Currently, no consensus exists regarding use of hemoglobin A1c for diagnosis of diabetes for children. Renella Cunas - Valid encounter within last 6 months    Recent Outpatient Visits          2 months ago Suppurative hidradenitis   [place], DO   2 months ago Pleuritic pain   [place], DO   4 months ago Type 2 diabetes mellitus with diabetic cataract, without long-term current use of insulin [facility])   [place], DO   9 months ago Annual physical exam   [place], DO   10 months ago Left lower quadrant abdominal pain   [facility] [place], Devonne Doughty, [place]

## 2019-10-05 ENCOUNTER — Other Ambulatory Visit: Payer: Self-pay | Admitting: Family Medicine

## 2019-10-05 DIAGNOSIS — I10 Essential (primary) hypertension: Secondary | ICD-10-CM

## 2019-10-05 DIAGNOSIS — E785 Hyperlipidemia, unspecified: Secondary | ICD-10-CM

## 2019-10-06 DIAGNOSIS — E039 Hypothyroidism, unspecified: Secondary | ICD-10-CM | POA: Diagnosis not present

## 2019-10-06 DIAGNOSIS — R1310 Dysphagia, unspecified: Secondary | ICD-10-CM | POA: Diagnosis not present

## 2019-10-06 DIAGNOSIS — I10 Essential (primary) hypertension: Secondary | ICD-10-CM | POA: Diagnosis not present

## 2019-10-06 DIAGNOSIS — E782 Mixed hyperlipidemia: Secondary | ICD-10-CM | POA: Diagnosis not present

## 2019-10-06 DIAGNOSIS — E119 Type 2 diabetes mellitus without complications: Secondary | ICD-10-CM | POA: Diagnosis not present

## 2019-11-04 ENCOUNTER — Other Ambulatory Visit: Payer: Self-pay | Admitting: Family Medicine

## 2019-11-04 DIAGNOSIS — I10 Essential (primary) hypertension: Secondary | ICD-10-CM

## 2019-11-04 NOTE — Telephone Encounter (Signed)
Requested medication (s) are due for refill today: yes  Requested medication (s) are on the active medication list: {yes  Last refill:  11/10/2018  #90 3 refills  Future visit scheduled:yes  Notes to clinic:  Labs out of date    Requested Prescriptions  Pending Prescriptions Disp Refills   benazepril-hydrochlorthiazide (LOTENSIN HCT) 10-12.5 MG tablet [Pharmacy Med Name: BENAZEPRIL/HCTZ 10/12.5MG  TABLETS] 90 tablet 3    Sig: TAKE 1 TABLET BY MOUTH DAILY      Cardiovascular:  ACEI + Diuretic Combos Failed - 11/04/2019  7:44 AM      Failed - Na in normal range and within 180 days    Sodium  Date Value Ref Range Status  10/25/2018 142 135 - 146 mmol/L Final  06/19/2015 142 134 - 144 mmol/L Final  12/07/2012 139 136 - 145 mmol/L Final          Failed - K in normal range and within 180 days    Potassium  Date Value Ref Range Status  10/25/2018 3.7 3.5 - 5.3 mmol/L Final  12/07/2012 3.3 (L) 3.5 - 5.1 mmol/L Final          Failed - Cr in normal range and within 180 days    Creat  Date Value Ref Range Status  10/25/2018 0.94 0.70 - 1.25 mg/dL Final    Comment:    For patients >6 years of age, the reference limit for Creatinine is approximately 13% higher for people identified as African-American. .    Creatinine, POC  Date Value Ref Range Status  06/18/2015 0 mg/dL Final          Failed - Ca in normal range and within 180 days    Calcium  Date Value Ref Range Status  10/25/2018 9.2 8.6 - 10.3 mg/dL Final   Calcium, Total  Date Value Ref Range Status  12/07/2012 8.9 8.5 - 10.1 mg/dL Final          Failed - Last BP in normal range    BP Readings from Last 1 Encounters:  06/09/19 (!) 151/90          Passed - Patient is not pregnant      Passed - Valid encounter within last 6 months    Recent Outpatient Visits           5 months ago Suppurative hidradenitis   [place], DO   5 months ago Pleuritic pain   [place], DO   7 months ago Type 2 diabetes mellitus with diabetic cataract, without long-term current use of insulin [facility])   [facility] O[place] Haus[place], DO   12 months ago Annual physical exam   [place], DO   1 year ago Left lower quadrant abdominal pain   [place], DO       Future Appointments             In 1 week Parks Ranger, Devonne Doughty, [facility], [facility]             Signed Prescriptions Disp Refills   omeprazole (PRILOSEC) 40 MG capsule 90 capsule 0    Sig: 1 CAPSULE BY MOUTH EVERY DAY BEFORE BREAKFAST 15-30 MINUTES BEFORE FIRST [place]      Gastroenterology: Proton Pump Inhibitors Passed - 11/04/2019  7:44 AM      Passed -  Valid encounter within last 12 months    Recent Outpatient Visits           5 months ago Suppurative hidradenitis   [place], DO   5 months ago Pleuritic pain   [place], DO   7 months ago Type 2 diabetes mellitus with diabetic cataract, without long-term current use of insulin [facility])   [place], DO   12 months ago Annual physical exam   [place], DO   1 year ago Left lower quadrant abdominal pain   [facility] Olin Hauser, DO       Future Appointments             In 1 week Parks Ranger, Devonne Doughty, DO [facility], [facility]

## 2019-11-04 NOTE — Telephone Encounter (Signed)
Requested Prescriptions  Pending Prescriptions Disp Refills   omeprazole (PRILOSEC) 40 MG capsule [Pharmacy Med Name: OMEPRAZOLE 40MG  CAPSULES] 90 capsule 0    Sig: 1 CAPSULE BY MOUTH EVERY DAY BEFORE BREAKFAST 15-30 MINUTES BEFORE FIRST MEAL OF THE DAY     Gastroenterology: Proton Pump Inhibitors Passed - 11/04/2019  7:44 AM      Passed - Valid encounter within last 12 months    Recent Outpatient Visits          5 months ago Suppurative hidradenitis   [place], DO   5 months ago Pleuritic pain   [place], DO   7 months ago Type 2 diabetes mellitus with diabetic cataract, without long-term current use of insulin ([place])   [facility], Devonne Doughty, DO   12 months ago Annual physical exam   [place], DO   1 year ago Left lower quadrant abdominal pain   [place], DO      Future Appointments            In 1 week Parks Ranger, [place] Dought[place], DO [facility], [facility]            benazepril-hydrochlorthiazide (LOTENSIN HCT) 10-12.5 MG tablet [Pharmacy Med Name: BENAZEPRIL/HCTZ 10/12.5MG  TABLETS] 90 tablet 3    Sig: TAKE 1 TABLET BY MOUTH DAILY     Cardiovascular:  ACEI + Diuretic Combos Failed - 11/04/2019  7:44 AM      Failed - Na in normal range and within 180 days    Sodium  Date Value Ref Range Status  10/25/2018 142 135 - 146 mmol/L Final  06/19/2015 142 134 - 144 mmol/L Final  12/07/2012 139 136 - 145 mmol/L Final         Failed - K in normal range and within 180 days    Potassium  Date Value Ref Range Status  10/25/2018 3.7 3.5 - 5.3 mmol/L Final  12/07/2012 3.3 (L) 3.5 - 5.1 mmol/L Final         Failed - Cr in normal range and within 180 days    Creat  Date Value Ref Range Status  10/25/2018 0.94 0.70 - 1.25 mg/dL Final    Comment:    For patients >49 years  of age, the reference limit for Creatinine is approximately 13% higher for people identified as African-American. .    Creatinine, POC  Date Value Ref Range Status  06/18/2015 0 mg/dL Final         Failed - Ca in normal range and within 180 days    Calcium  Date Value Ref Range Status  10/25/2018 9.2 8.6 - 10.3 mg/dL Final   Calcium, Total  Date Value Ref Range Status  12/07/2012 8.9 8.5 - 10.1 mg/dL Final         Failed - Last BP in normal range    BP Readings from Last 1 Encounters:  06/09/19 (!) 151/90         Passed - Patient is not pregnant      Passed - Valid encounter within last 6 months    Recent Outpatient Visits          5 months ago Suppurative hidradenitis   [place], DO   5 months ago Pleuritic pain   [place], DO   7  months ago Type 2 diabetes mellitus with diabetic cataract, without long-term current use of insulin [facility])   [place], DO   12 months ago Annual physical exam   [place], DO   1 year ago Left lower quadrant abdominal pain   [place], DO      Future Appointments            In 1 week Parks Ranger, [place] Dought[place], DO [facility], [facility]

## 2019-11-08 ENCOUNTER — Other Ambulatory Visit: Payer: Self-pay | Admitting: Family Medicine

## 2019-11-08 DIAGNOSIS — I10 Essential (primary) hypertension: Secondary | ICD-10-CM

## 2019-11-08 DIAGNOSIS — E039 Hypothyroidism, unspecified: Secondary | ICD-10-CM

## 2019-11-08 DIAGNOSIS — Z0000 Encounter for general adult medical examination without abnormal findings: Secondary | ICD-10-CM

## 2019-11-08 DIAGNOSIS — E1136 Type 2 diabetes mellitus with diabetic cataract: Secondary | ICD-10-CM

## 2019-11-08 DIAGNOSIS — E785 Hyperlipidemia, unspecified: Secondary | ICD-10-CM

## 2019-11-08 DIAGNOSIS — N400 Enlarged prostate without lower urinary tract symptoms: Secondary | ICD-10-CM

## 2019-11-09 ENCOUNTER — Other Ambulatory Visit: Payer: Self-pay

## 2019-11-09 ENCOUNTER — Other Ambulatory Visit: Payer: Medicare Other

## 2019-11-09 DIAGNOSIS — E785 Hyperlipidemia, unspecified: Secondary | ICD-10-CM | POA: Diagnosis not present

## 2019-11-09 DIAGNOSIS — I10 Essential (primary) hypertension: Secondary | ICD-10-CM | POA: Diagnosis not present

## 2019-11-09 DIAGNOSIS — E1169 Type 2 diabetes mellitus with other specified complication: Secondary | ICD-10-CM | POA: Diagnosis not present

## 2019-11-09 DIAGNOSIS — E039 Hypothyroidism, unspecified: Secondary | ICD-10-CM | POA: Diagnosis not present

## 2019-11-09 DIAGNOSIS — E1136 Type 2 diabetes mellitus with diabetic cataract: Secondary | ICD-10-CM | POA: Diagnosis not present

## 2019-11-10 LAB — COMPLETE METABOLIC PANEL WITH GFR
AG Ratio: 1.3 (calc) (ref 1.0–2.5)
ALT: 19 U/L (ref 9–46)
AST: 21 U/L (ref 10–35)
Albumin: 4 g/dL (ref 3.6–5.1)
Alkaline phosphatase (APISO): 55 U/L (ref 35–144)
BUN: 16 mg/dL (ref 7–25)
CO2: 27 mmol/L (ref 20–32)
Calcium: 9 mg/dL (ref 8.6–10.3)
Chloride: 102 mmol/L (ref 98–110)
Creat: 1.06 mg/dL (ref 0.70–1.25)
GFR, Est African American: 83 mL/min/{1.73_m2} (ref >=60)
GFR, Est Non African American: 72 mL/min/{1.73_m2} (ref >=60)
Globulin: 3.1 g/dL (calc) (ref 1.9–3.7)
Glucose, Bld: 101 mg/dL — ABNORMAL HIGH (ref 65–99)
Potassium: 3.4 mmol/L — ABNORMAL LOW (ref 3.5–5.3)
Sodium: 140 mmol/L (ref 135–146)
Total Bilirubin: 0.6 mg/dL (ref 0.2–1.2)
Total Protein: 7.1 g/dL (ref 6.1–8.1)

## 2019-11-10 LAB — CBC WITH DIFFERENTIAL/PLATELET
Absolute Monocytes: 684 cells/uL (ref 200–950)
Basophils Absolute: 30 cells/uL (ref 0–200)
Basophils Relative: 0.5 %
Eosinophils Absolute: 120 cells/uL (ref 15–500)
Eosinophils Relative: 2 %
HCT: 44.2 % (ref 38.5–50.0)
Hemoglobin: 14.7 g/dL (ref 13.2–17.1)
Lymphs Abs: 3060 cells/uL (ref 850–3900)
MCH: 29.7 pg (ref 27.0–33.0)
MCHC: 33.3 g/dL (ref 32.0–36.0)
MCV: 89.3 fL (ref 80.0–100.0)
MPV: 10.5 fL (ref 7.5–12.5)
Monocytes Relative: 11.4 %
Neutro Abs: 2106 cells/uL (ref 1500–7800)
Neutrophils Relative %: 35.1 %
Platelets: 204 10*3/uL (ref 140–400)
RBC: 4.95 10*6/uL (ref 4.20–5.80)
RDW: 12.4 % (ref 11.0–15.0)
Total Lymphocyte: 51 %
WBC: 6 10*3/uL (ref 3.8–10.8)

## 2019-11-10 LAB — LIPID PANEL
Cholesterol: 162 mg/dL (ref <200)
HDL: 49 mg/dL (ref >=40)
LDL Cholesterol (Calc): 86 mg/dL (calc)
Non-HDL Cholesterol (Calc): 113 mg/dL (calc) (ref <130)
Total CHOL/HDL Ratio: 3.3 (calc) (ref <5.0)
Triglycerides: 170 mg/dL — ABNORMAL HIGH (ref <150)

## 2019-11-10 LAB — HEMOGLOBIN A1C
Hgb A1c MFr Bld: 6.4 % of total Hgb — ABNORMAL HIGH (ref <5.7)
Mean Plasma Glucose: 137 (calc)
eAG (mmol/L): 7.6 (calc)

## 2019-11-10 LAB — PSA: PSA: 1.8 ng/mL (ref <=4.0)

## 2019-11-10 LAB — T4, FREE: Free T4: 1.1 ng/dL (ref 0.8–1.8)

## 2019-11-10 LAB — TSH: TSH: 3.85 mIU/L (ref 0.40–4.50)

## 2019-11-14 ENCOUNTER — Other Ambulatory Visit: Payer: Self-pay | Admitting: Neurosurgery

## 2019-11-14 DIAGNOSIS — D329 Benign neoplasm of meninges, unspecified: Secondary | ICD-10-CM

## 2019-11-15 ENCOUNTER — Other Ambulatory Visit: Payer: Self-pay

## 2019-11-15 ENCOUNTER — Encounter: Payer: Self-pay | Admitting: Family Medicine

## 2019-11-15 ENCOUNTER — Ambulatory Visit (INDEPENDENT_AMBULATORY_CARE_PROVIDER_SITE_OTHER): Payer: Medicare Other | Admitting: Family Medicine

## 2019-11-15 VITALS — BP 141/79 | HR 50 | Temp 96.8°F | Resp 16 | Ht 66.0 in | Wt 170.0 lb

## 2019-11-15 DIAGNOSIS — G248 Other dystonia: Secondary | ICD-10-CM

## 2019-11-15 DIAGNOSIS — Z0000 Encounter for general adult medical examination without abnormal findings: Secondary | ICD-10-CM | POA: Diagnosis not present

## 2019-11-15 DIAGNOSIS — E1136 Type 2 diabetes mellitus with diabetic cataract: Secondary | ICD-10-CM | POA: Diagnosis not present

## 2019-11-15 DIAGNOSIS — E039 Hypothyroidism, unspecified: Secondary | ICD-10-CM | POA: Diagnosis not present

## 2019-11-15 DIAGNOSIS — Z23 Encounter for immunization: Secondary | ICD-10-CM

## 2019-11-15 DIAGNOSIS — M25572 Pain in left ankle and joints of left foot: Secondary | ICD-10-CM

## 2019-11-15 DIAGNOSIS — E785 Hyperlipidemia, unspecified: Secondary | ICD-10-CM

## 2019-11-15 DIAGNOSIS — I10 Essential (primary) hypertension: Secondary | ICD-10-CM

## 2019-11-15 DIAGNOSIS — G8929 Other chronic pain: Secondary | ICD-10-CM

## 2019-11-15 DIAGNOSIS — Z96652 Presence of left artificial knee joint: Secondary | ICD-10-CM

## 2019-11-15 DIAGNOSIS — E1169 Type 2 diabetes mellitus with other specified complication: Secondary | ICD-10-CM

## 2019-11-15 MED ORDER — ATORVASTATIN CALCIUM 20 MG PO TABS: 20.0000 mg | ORAL_TABLET | Freq: Every day | ORAL | 3 refills | Status: DC

## 2019-11-15 NOTE — Progress Notes (Signed)
Subjective:    Patient ID: Ross Riley, male    DOB: 1951/01/24, 69 y.o.   MRN: [id number]  Ross Riley is a 69 y.o. male presenting on 11/15/2019 for Annual Exam  Spanish language interpreter on phone Kelton Pillar [phone]  HPI   Here for Annual Physical and Lab Review. Diabetes, Type 2 No concerns today. A1c last check stable at 6.4. Denies hypoglycemia. Checking CBG regularly. Meds:Glipizide XL 5mg  daily Reports good compliance. Tolerating well w/o side-effects Currently on ACEi, ASA, Statin Lifestyle: - Diet (balanced diet) - Exercise (limited activity due to chronic LLE issues see note) He will return to [place]: - Reports no concerns. Last lipid panel 11/2019, controlled  - Currently taking Atorvastatin 20mg , tolerating well without side effects or myalgias  TSH Thyroid Function Tests - Controlled on current dose levothyroxine.  CHRONIC HTN Reportsno new concerns. Current Meds -Metoprolol 12.5mg  BID (Half of 25mg ), Benazepril-HCTZ 10-12.5mg  daily Reports good compliance, took meds today. Tolerating well, w/o complaints.  PMH -Chronic Left Lower ExtL ankleDystonia/ Osteoarthritis knees s/p L TKR- limited ambulation, cannot walk >200 ft w/o stopping to rest, uses cane and handicap placard - He has recent acute onset pain in Left ankle past few days, feels burning and pain, no injury or fall, has shoe wedge and cane _ previously saw [facility], now agrees to return to orthopedic - Needs rx for DME [employer]:   PSA Prostate Cancer Screening - 1.8, negative.  Depression screen [facility] 2/9 11/15/2019 05/24/2019 11/05/2018  Decreased Interest 0 0 0  Down, Depressed, Hopeless 0 0 0  PHQ - 2 Score 0 0 0  Altered sleeping - - -  Tired, decreased energy - - -  Change in appetite - - -  Feeling bad or failure about yourself  - - -  Trouble concentrating - - -  Moving slowly or fidgety/restless - - -  Suicidal thoughts - - -    PHQ-9 Score - - -  Difficult doing work/chores - - -    Past Medical History:  Diagnosis Date  . Arthritis   . BPH (benign prostatic hyperplasia)   . Chronic kidney disease    kidney stones  . Dystonia   . ED (erectile dysfunction)   . GERD (gastroesophageal reflux disease)   . Glaucoma (increased eye pressure)   . Hematuria   . Hemorrhoids   . Hyperlipidemia   . Hypertension   . Thyroid disease    Past Surgical History:  Procedure Laterality Date  . CATARACT EXTRACTION Bilateral   . EXTRACORPOREAL SHOCK WAVE LITHOTRIPSY Right   . JOINT REPLACEMENT Left 08/2016   left knee, [place]  2009  . PROSTATE SURGERY  2011  . TRANSURETHRAL RESECTION OF PROSTATE N/A 12/06/2014   Procedure: TRANSURETHRAL RESECTION OF THE PROSTATE (TURP);  Surgeon: Nickie Retort, MD;  Location: [facility] ORS;  Service: Urology;  Laterality: N/A;   Social History   Socioeconomic History  . Marital status: Married    Spouse name: Not on file  . Number of children: Not on file  . Years of education: Not on file  . Highest education level: 9th grade  Occupational History  . Occupation: retired  Tobacco Use  . Smoking status: Former Smoker    Packs/day: 1.00    Years: 32.00    Pack years: 32.00    Types: Cigarettes    Quit date: 03/04/1991    Years since quitting:  28.7  . Smokeless tobacco: Former Clinical biochemist  . Vaping Use: Never used  Substance and Sexual Activity  . Alcohol use: Yes    Alcohol/week: 0.0 standard drinks    Comment: occasional  . Drug use: No  . Sexual activity: Yes    Birth control/protection: None  Other Topics Concern  . Not on file  Social History Narrative  . Not on file   Social Determinants of Health   Financial Resource Strain:   . Difficulty of Paying Living Expenses: Not on file  Food Insecurity:   . Worried About Programme researcher, broadcasting/film/video in the Last Year: Not on file  . Ran Out of Food in the Last Year: Not on file  Transportation  Needs:   . Lack of Transportation (Medical): Not on file  . Lack of Transportation (Non-Medical): Not on file  Physical Activity:   . Days of Exercise per Week: Not on file  . Minutes of Exercise per Session: Not on file  Stress:   . Feeling of Stress : Not on file  Social Connections:   . Frequency of Communication with Friends and Family: Not on file  . Frequency of Social Gatherings with Friends and Family: Not on file  . Attends Religious Services: Not on file  . Active Member of Clubs or Organizations: Not on file  . Attends Banker Meetings: Not on file  . Marital Status: Not on file  Intimate Partner Violence:   . Fear of Current or Ex-Partner: Not on file  . Emotionally Abused: Not on file  . Physically Abused: Not on file  . Sexually Abused: Not on file   Family History  Problem Relation Age of Onset  . Heart disease Mother    Current Outpatient Medications on File Prior to Visit  Medication Sig  . ACCU-CHEK FASTCLIX LANCETS MISC check blood sugar up to 1 time daily as directed  . Alcohol Swabs (B-D SINGLE USE SWABS REGULAR) PADS check blood sugar up to 1 time daily as directed  . aspirin EC 81 MG tablet Take by mouth.  . benazepril-hydrochlorthiazide (LOTENSIN HCT) 10-12.5 MG tablet TAKE 1 TABLET BY MOUTH DAILY  . Blood Glucose Monitoring Suppl (ACCU-CHEK AVIVA PLUS) w/Device KIT Use device to check blood sugar up to 1 time daily as directed  . Cholecalciferol (VITAMIN D3) 125 MCG (5000 UT) CAPS Take by mouth.  Marland Kitchen glipiZIDE (GLUCOTROL XL) 5 MG 24 hr tablet TAKE 1 TABLET(5 MG) BY MOUTH DAILY WITH BREAKFAST  . glucose blood test strip check blood sugar up to 1 time daily as directed  . latanoprost (XALATAN) 0.005 % ophthalmic solution INT 1 GTT INTO OU QHS  . levothyroxine (SYNTHROID) 75 MCG tablet Take 50 mcg by mouth.   . metoprolol tartrate (LOPRESSOR) 25 MG tablet TAKE 1/2 TABLET(12.5 MG) BY MOUTH TWICE DAILY  . naproxen (NAPROSYN) 500 MG tablet Take 1  tablet (500 mg total) by mouth 2 (two) times daily with a meal. For 2-4 weeks then as needed  . omeprazole (PRILOSEC) 40 MG capsule 1 CAPSULE BY MOUTH EVERY DAY BEFORE BREAKFAST 15-30 MINUTES BEFORE FIRST MEAL OF THE DAY  . sildenafil (VIAGRA) 100 MG tablet Take 1 tablet (100 mg total) by mouth daily as needed for erectile dysfunction. Take two hours prior to intercourse on an empty stomach  . dorzolamide-timolol (COSOPT) 22.3-6.8 MG/ML ophthalmic solution dorzolamide 22.3 mg-timolol 6.8 mg/mL eye drops (Patient not taking: Reported on 11/15/2019)  . nortriptyline (PAMELOR) 10 MG  capsule take 30 mg nightly for one week, then increase to 40 mg nightly   No current facility-administered medications on file prior to visit.    Review of Systems  Constitutional: Negative for activity change, appetite change, chills, diaphoresis, fatigue and fever.  HENT: Negative for congestion and hearing loss.   Eyes: Negative for visual disturbance.  Respiratory: Negative for cough, chest tightness, shortness of breath and wheezing.   Cardiovascular: Negative for chest pain, palpitations and leg swelling.  Gastrointestinal: Negative for abdominal pain, constipation, diarrhea, nausea and vomiting.  Genitourinary: Negative for dysuria, frequency and hematuria.  Musculoskeletal: Negative for arthralgias and neck pain.  Skin: Negative for rash.  Neurological: Negative for dizziness, weakness, light-headedness, numbness and headaches.  Hematological: Negative for adenopathy.  Psychiatric/Behavioral: Negative for behavioral problems, dysphoric mood and sleep disturbance.   Per HPI unless specifically indicated above     Objective:    BP (!) 141/79   Pulse (!) 50   Temp (!) 96.8 F (36 C) (Temporal)   Resp 16   Ht $R'5\' 6"'Yn$  (1.676 m)   Wt 170 lb (77.1 kg)   SpO2 100%   BMI 27.44 kg/m   Wt Readings from Last 3 Encounters:  11/15/19 170 lb (77.1 kg)  06/09/19 171 lb (77.6 kg)  06/01/19 172 lb (78 kg)      Physical Exam Vitals and nursing note reviewed.  Constitutional:      General: He is not in acute distress.    Appearance: He is well-developed. He is not diaphoretic.     Comments: Well-appearing, comfortable, cooperative  HENT:     Head: Normocephalic and atraumatic.  Eyes:     General:        Right eye: No discharge.        Left eye: No discharge.     Conjunctiva/sclera: Conjunctivae normal.     Pupils: Pupils are equal, round, and reactive to light.  Neck:     Thyroid: No thyromegaly.  Cardiovascular:     Rate and Rhythm: Normal rate and regular rhythm.     Heart sounds: Normal heart sounds. No murmur heard.   Pulmonary:     Effort: Pulmonary effort is normal. No respiratory distress.     Breath sounds: Normal breath sounds. No wheezing or rales.  Abdominal:     General: Bowel sounds are normal. There is no distension.     Palpations: Abdomen is soft. There is no mass.     Tenderness: There is no abdominal tenderness.  Musculoskeletal:        General: No tenderness. Normal range of motion.     Cervical back: Normal range of motion and neck supple.     Comments: Upper / Lower Extremities: - Normal muscle tone, strength bilateral upper extremities 5/5, lower extremities 5/5  Lymphadenopathy:     Cervical: No cervical adenopathy.  Skin:    General: Skin is warm and dry.     Findings: No erythema or rash.  Neurological:     Mental Status: He is alert and oriented to person, place, and time.     Comments: Distal sensation intact to light touch all extremities  Psychiatric:        Behavior: Behavior normal.     Comments: Well groomed, good eye contact, normal speech and thoughts    Results for orders placed or performed in visit on 11/08/19  Hemoglobin A1c  Result Value Ref Range   Hgb A1c MFr Bld 6.4 (H) <5.7 % of total  Hgb   Mean Plasma Glucose 137 (calc)   eAG (mmol/L) 7.6 (calc)  CBC with Differential/Platelet  Result Value Ref Range   WBC 6.0 3.8 - 10.8  Thousand/uL   RBC 4.95 4.20 - 5.80 Million/uL   Hemoglobin 14.7 13.2 - 17.1 g/dL   HCT 44.2 38 - 50 %   MCV 89.3 80.0 - 100.0 fL   MCH 29.7 27.0 - 33.0 pg   MCHC 33.3 32.0 - 36.0 g/dL   RDW 12.4 11.0 - 15.0 %   Platelets 204 140 - 400 Thousand/uL   MPV 10.5 7.5 - 12.5 fL   Neutro Abs 2,106 1,500 - 7,800 cells/uL   Lymphs Abs 3,060 850 - 3,900 cells/uL   Absolute Monocytes 684 200 - 950 cells/uL   Eosinophils Absolute 120 15 - 500 cells/uL   Basophils Absolute 30 0 - 200 cells/uL   Neutrophils Relative % 35.1 %   Total Lymphocyte 51.0 %   Monocytes Relative 11.4 %   Eosinophils Relative 2.0 %   Basophils Relative 0.5 %  COMPLETE METABOLIC PANEL WITH GFR  Result Value Ref Range   Glucose, Bld 101 (H) 65 - 99 mg/dL   BUN 16 7 - 25 mg/dL   Creat 1.06 0.70 - 1.25 mg/dL   GFR, Est Non African American 72 > OR = 60 mL/min/1.38m2   GFR, Est African American 83 > OR = 60 mL/min/1.67m2   BUN/Creatinine Ratio NOT APPLICABLE 6 - 22 (calc)   Sodium 140 135 - 146 mmol/L   Potassium 3.4 (L) 3.5 - 5.3 mmol/L   Chloride 102 98 - 110 mmol/L   CO2 27 20 - 32 mmol/L   Calcium 9.0 8.6 - 10.3 mg/dL   Total Protein 7.1 6.1 - 8.1 g/dL   Albumin 4.0 3.6 - 5.1 g/dL   Globulin 3.1 1.9 - 3.7 g/dL (calc)   AG Ratio 1.3 1.0 - 2.5 (calc)   Total Bilirubin 0.6 0.2 - 1.2 mg/dL   Alkaline phosphatase (APISO) 55 35 - 144 U/L   AST 21 10 - 35 U/L   ALT 19 9 - 46 U/L  Lipid panel  Result Value Ref Range   Cholesterol 162 <200 mg/dL   HDL 49 > OR = 40 mg/dL   Triglycerides 170 (H) <150 mg/dL   LDL Cholesterol (Calc) 86 mg/dL (calc)   Total CHOL/HDL Ratio 3.3 <5.0 (calc)   Non-HDL Cholesterol (Calc) 113 <130 mg/dL (calc)  PSA  Result Value Ref Range   PSA 1.8 < OR = 4.0 ng/mL  TSH  Result Value Ref Range   TSH 3.85 0.40 - 4.50 mIU/L  T4, free  Result Value Ref Range   Free T4 1.1 0.8 - 1.8 ng/dL      Assessment & Plan:   Problem List Items Addressed This Visit    Type 2 diabetes mellitus with  diabetic cataract, without long-term current use of insulin ([place])    Followed by [facility] Endocrinology Controlled A1c 6.4 stable No known hypoglycemia. Complications - DM cataracts removed, other including hyperlipidemia, GERD, hypothyroidism - increases risk of future cardiovascular complications   Plan:  1. Continue current therapy - Glipizide XL $RemoveBefo'5mg'FuzGaknvpRY$  daily - caution hypoglycemia 2. Encourage improved lifestyle - low carb, low sugar diet, reduce portion size, continue improving regular exercise 3. Check CBG , bring log to next visit for review 4. Continue ASA, Statin Return to [facility]      Relevant Medications   atorvastatin (LIPITOR) 20 MG tablet   S/P  TKR (total knee replacement), left   Hypothyroidism    Asymptomatic, controlled Followed by [facility] Endocrinology now      Hyperlipidemia associated with type 2 diabetes mellitus ([place])    Controlled cholesterol on statin and lifestyle Last lipid panel 11/2019  Plan: 1. Continue current meds - Atorvastatin 20mg  daily 2. Continue ASA 81mg  for primary ASCVD risk reduction 3. Encourage improved lifestyle - low carb/cholesterol, reduce portion size, continue improving regular exercise      Relevant Medications   atorvastatin (LIPITOR) 20 MG tablet   Focal dystonia   Relevant Orders   Ambulatory referral to Orthopedic Surgery   Essential hypertension    Well-controlled HTN - Home BP readings normal  No known complications     Plan:  1. Continue current BP regimen - Metoprolol 12.5mg  BID (Half of 25mg ), benazepril-hctz 10-12.5mg  2. Encourage improved lifestyle - low sodium diet, regular exercise 3. Continue monitor BP outside office, bring readings to next visit, if persistently >140/90 or new symptoms notify office sooner      Relevant Medications   atorvastatin (LIPITOR) 20 MG tablet    Other Visit Diagnoses    Annual physical exam    -  Primary   Needs flu shot       Relevant Orders   Flu Vaccine QUAD High Dose(Fluad)  (Completed)   Chronic pain of left ankle       Relevant Orders   Ambulatory referral to Orthopedic Surgery        Meds ordered this encounter  Medications  . atorvastatin (LIPITOR) 20 MG tablet    Sig: Take 1 tablet (20 mg total) by mouth daily.    Dispense:  90 tablet    Refill:  3    Add refills   Weakness and pain L ankle from chronic focal dystonia Handwritten rx for Walker, standard with dx focal dystonia, s/p TKR, given to patient he can submit to med supply store. We can complete orders once he has submitted.  [place]. [address] [place], [place] [address] Ph: [phone] Fax: [phone]  Refer to [employer].  Orders Placed This Encounter  Procedures  . Flu Vaccine QUAD High Dose(Fluad)  . Ambulatory referral to Orthopedic Surgery    Referral Priority:   Routine    Referral Type:   Surgical    Referral Reason:   Specialty Services Required    Requested Specialty:   Orthopedic Surgery    Number of Visits Requested:   1     Follow up plan: Return in about 3 months (around 02/14/2020), or if symptoms worsen or fail to improve, for left ankle pain.  [name], [facility] 11/15/2019, 2:22 PM

## 2019-11-15 NOTE — Patient Instructions (Addendum)
Thank you for coming to the office today.  [facility] Address: [address], [place], [place] [address]  Phone: [phone]  ------------------------------------------------- ------------------  They will call you with an appointment.  [facility] [place], [place]  [address] Phone: [phone]  Take the rx to the location of your choice and they can get the walker ordered for you.   [place]. [address] [place], [place] [address] Ph: [facility] [employer] [address] [place], [place] [address] Ph: [phone]  [facility] [place], [place] [address] Ph [phone] - Lateral shoe wedges as well     Please schedule a Follow-up Appointment to: Return in about 3 months (around 02/14/2020), or if symptoms worsen or fail to improve, for left ankle pain.  If you have any other questions or concerns, please feel free to call the office or send a message through [place]. You may also schedule an earlier appointment if necessary.  Additionally, you may be receiving a survey about your experience at our office within a few days to 1 week by e-mail or mail. We value your feedback.  [name], DO [place]

## 2019-11-16 NOTE — Assessment & Plan Note (Signed)
Well-controlled HTN - Home BP readings normal  No known complications     Plan:  1. Continue current BP regimen - Metoprolol 12.5mg  BID (Half of 25mg ), benazepril-hctz 10-12.5mg  2. Encourage improved lifestyle - low sodium diet, regular exercise 3. Continue monitor BP outside office, bring readings to next visit, if persistently >140/90 or new symptoms notify office sooner

## 2019-11-16 NOTE — Assessment & Plan Note (Signed)
Asymptomatic, controlled Followed by [facility] Endocrinology now

## 2019-11-16 NOTE — Assessment & Plan Note (Signed)
Followed by [facility] Endocrinology Controlled A1c 6.4 stable No known hypoglycemia. Complications - DM cataracts removed, other including hyperlipidemia, GERD, hypothyroidism - increases risk of future cardiovascular complications   Plan:  1. Continue current therapy - Glipizide XL 5mg  daily - caution hypoglycemia 2. Encourage improved lifestyle - low carb, low sugar diet, reduce portion size, continue improving regular exercise 3. Check CBG , bring log to next visit for review 4. Continue ASA, Statin Return to [facility]

## 2019-11-16 NOTE — Assessment & Plan Note (Signed)
Controlled cholesterol on statin and lifestyle Last lipid panel 11/2019  Plan: 1. Continue current meds - Atorvastatin 20mg  daily 2. Continue ASA 81mg  for primary ASCVD risk reduction 3. Encourage improved lifestyle - low carb/cholesterol, reduce portion size, continue improving regular exercise

## 2019-11-24 ENCOUNTER — Other Ambulatory Visit: Payer: Medicare Other

## 2019-11-24 ENCOUNTER — Telehealth: Payer: Self-pay

## 2019-11-24 DIAGNOSIS — Z20822 Contact with and (suspected) exposure to covid-19: Secondary | ICD-10-CM | POA: Diagnosis not present

## 2019-11-24 NOTE — Telephone Encounter (Signed)
Handwritten Rx provided with ICD10 codes M25.579 & G24.8 to be faxed to [facility].

## 2019-11-24 NOTE — Telephone Encounter (Signed)
Copied from [place] [id number]. Topic: General - Inquiry >> Nov 23, 2019  3:56 PM Gillis Ends D wrote: Reason for CRM: Patient received a prescription for a walker however they need a new prescription that says a walker with a seat. The patient would like for you to fax the prescription to [phone]. The name of the company is [employer]. If you have an questions please call [phone]. Please Advise

## 2019-11-25 DIAGNOSIS — M19072 Primary osteoarthritis, left ankle and foot: Secondary | ICD-10-CM | POA: Diagnosis not present

## 2019-11-25 DIAGNOSIS — M25572 Pain in left ankle and joints of left foot: Secondary | ICD-10-CM | POA: Diagnosis not present

## 2019-11-25 NOTE — Telephone Encounter (Signed)
New order faxed over to [facility].

## 2019-11-26 LAB — NOVEL CORONAVIRUS, NAA: SARS-CoV-2, NAA: NOT DETECTED

## 2019-11-26 LAB — SARS-COV-2, NAA 2 DAY TAT

## 2019-11-30 ENCOUNTER — Telehealth: Payer: Self-pay

## 2019-11-30 NOTE — Telephone Encounter (Signed)
As per patient's daughter they needed regulated walker --three wheel walker but Rx was faxed for regular walker --needed a new Rx faxed to [employer].

## 2019-11-30 NOTE — Telephone Encounter (Signed)
New rx written  3 wheeled rollator walker  Dx focal dystonia (G24.8), s/p TKR L knee (K44.695)  Ready for fax back to [place] 11/30/2019, 5:08 PM

## 2019-11-30 NOTE — Telephone Encounter (Signed)
Copied from [place] [id number]. Topic: General - Other >> Nov 29, 2019  4:46 PM Yvette Rack wrote: Reason for CRM: Pt daughter Dedra Skeens stated pt needs a new Rx for a rollator 3 wheel walker. Cb# [phone]

## 2019-12-01 NOTE — Telephone Encounter (Signed)
Rx faxed

## 2019-12-07 ENCOUNTER — Ambulatory Visit
Admission: RE | Admit: 2019-12-07 | Discharge: 2019-12-07 | Disposition: A | Discharge status: Home / Self Care | Payer: Medicare Other | Source: Ambulatory Visit | Attending: Neurosurgery | Admitting: Neurosurgery

## 2019-12-07 ENCOUNTER — Other Ambulatory Visit: Payer: Self-pay

## 2019-12-07 DIAGNOSIS — D329 Benign neoplasm of meninges, unspecified: Secondary | ICD-10-CM | POA: Insufficient documentation

## 2019-12-07 DIAGNOSIS — G9389 Other specified disorders of brain: Secondary | ICD-10-CM | POA: Diagnosis not present

## 2019-12-07 DIAGNOSIS — G319 Degenerative disease of nervous system, unspecified: Secondary | ICD-10-CM | POA: Diagnosis not present

## 2019-12-07 MED ORDER — GADOBUTROL 1 MMOL/ML IV SOLN
7.5000 mL | Freq: Once | INTRAVENOUS | Status: AC | PRN
  Administered 2019-12-07: 7.5 mL via INTRAVENOUS

## 2019-12-12 NOTE — Telephone Encounter (Signed)
Pts Daughter called about the status of the 3 wheel walker order/ please advise asap

## 2019-12-13 DIAGNOSIS — D329 Benign neoplasm of meninges, unspecified: Secondary | ICD-10-CM | POA: Diagnosis not present

## 2019-12-13 NOTE — Telephone Encounter (Signed)
Spoke to the patient's daughter she was just checking if it was faxed before [employer].

## 2019-12-13 NOTE — Telephone Encounter (Signed)
PTs daughter requesting a call back to go over this process / please advise

## 2019-12-14 NOTE — Telephone Encounter (Signed)
According to the patient in order for him to be reimbursed for his out of pocket expense on the walker, the insurance company also needs a copy of the Rx for the 3 wheeled rollator walker that he purchased from [place] to clinic for Rx to be faxed to [employer]. No fax # provided.

## 2019-12-15 NOTE — Telephone Encounter (Signed)
Pt daughter Dedra Skeens states that the documentation sent that Rip Harbour needed to be faxed over to [employer] [phone]. Previous note from NT  They according to [facility], daughter,  need the Member IDv is [phone] and BD on letter DOB 06/27/1950. They seems to have a clear understanding of process and everything would be in place but as we ended conversation pt said they had not purchased wheelchair yet...Marland KitchenMarland KitchenMarland Kitchen I am thinking this may be an issue if maybe DR K's nurse can make sure this does not get further confused.

## 2019-12-15 NOTE — Telephone Encounter (Signed)
Called [name] to clarify she verbalized understanding and now wanted to add patient's insurance ID on the letter so she can get some discount she is aware of Rx is not for the wheel chair and wanted to mail the Rx and letter.

## 2019-12-15 NOTE — Telephone Encounter (Signed)
Letter is written on chart. Would you be able to print it and I can sign it and we can mail it tomorrow?  [name], [facility] 12/15/2019, 3:31 PM

## 2019-12-15 NOTE — Telephone Encounter (Signed)
As per daughter wants office to contact insurance to get reimbursed for his 3 wheeled rollator walker and needed medical  necessity letter from Dr Raliegh Ip, told her to get more specific info from insurance what exactly they require and if Dr Raliegh Ip will write letter than will mail it to the patient and she will need to contact insurance company.

## 2019-12-15 NOTE — Telephone Encounter (Signed)
Daughter requested letter mailed to her and then she will fax it to the insurance company.

## 2019-12-15 NOTE — Telephone Encounter (Signed)
Usually the insurance / company will have a specific form that shows what requirements are for the equipment.  I just looked up some information. I can do a letter if they prefer and then we can just send it and see if any other information is missing.  ---------------  Diagnosis: Focal Dystonia, Left Lower Extremity (G24.8)  The patient has a mobility limitation that prevents the patient from  accomplishing one or more of his/her ADLs in the home  ----------------  If they do not get back in touch with [place], let me know and I can type this in a letter and we can try sending that.  [name], [facility] 12/15/2019, 2:48 PM

## 2019-12-16 ENCOUNTER — Encounter: Payer: Self-pay | Admitting: Family Medicine

## 2019-12-16 NOTE — Telephone Encounter (Signed)
Will mail the medical necessity letter and Rx to the patient.

## 2019-12-30 DIAGNOSIS — M25572 Pain in left ankle and joints of left foot: Secondary | ICD-10-CM | POA: Diagnosis not present

## 2019-12-30 DIAGNOSIS — G8929 Other chronic pain: Secondary | ICD-10-CM | POA: Diagnosis not present

## 2019-12-30 DIAGNOSIS — S93402A Sprain of unspecified ligament of left ankle, initial encounter: Secondary | ICD-10-CM | POA: Diagnosis not present

## 2019-12-30 DIAGNOSIS — Q667 Congenital pes cavus, unspecified foot: Secondary | ICD-10-CM | POA: Diagnosis not present

## 2019-12-30 DIAGNOSIS — M25372 Other instability, left ankle: Secondary | ICD-10-CM | POA: Diagnosis not present

## 2019-12-30 DIAGNOSIS — M19072 Primary osteoarthritis, left ankle and foot: Secondary | ICD-10-CM | POA: Diagnosis not present

## 2020-01-03 ENCOUNTER — Other Ambulatory Visit: Payer: Self-pay | Admitting: Family Medicine

## 2020-01-03 DIAGNOSIS — I10 Essential (primary) hypertension: Secondary | ICD-10-CM

## 2020-01-03 NOTE — Telephone Encounter (Signed)
Requested Prescriptions  Pending Prescriptions Disp Refills   metoprolol tartrate (LOPRESSOR) 25 MG tablet [Pharmacy Med Name: METOPROLOL TARTRATE 25MG  TABLETS] 90 tablet 0    Sig: TAKE 1/2 TABLET(12.5 MG) BY MOUTH TWICE DAILY     Cardiovascular:  Beta Blockers Failed - 01/03/2020  7:04 AM      Failed - Last BP in normal range    BP Readings from Last 1 Encounters:  11/15/19 (!) 141/79         Passed - Last Heart Rate in normal range    Pulse Readings from Last 1 Encounters:  11/15/19 (!) 50         Passed - Valid encounter within last 6 months    Recent Outpatient Visits          1 month ago Annual physical exam   [facility] Olin Hauser, DO   7 months ago Suppurative hidradenitis   [place], DO   7 months ago Pleuritic pain   [place], DO   9 months ago Type 2 diabetes mellitus with diabetic cataract, without long-term current use of insulin [facility])   [facility] [place], Devonne Doughty, DO   1 year ago Annual physical exam   [place], Devonne Doughty, DO

## 2020-01-10 DIAGNOSIS — Z96652 Presence of left artificial knee joint: Secondary | ICD-10-CM | POA: Diagnosis not present

## 2020-01-10 DIAGNOSIS — M25579 Pain in unspecified ankle and joints of unspecified foot: Secondary | ICD-10-CM | POA: Diagnosis not present

## 2020-01-18 ENCOUNTER — Encounter: Payer: Self-pay | Admitting: Family Medicine

## 2020-01-18 ENCOUNTER — Other Ambulatory Visit: Payer: Self-pay

## 2020-01-18 ENCOUNTER — Ambulatory Visit (INDEPENDENT_AMBULATORY_CARE_PROVIDER_SITE_OTHER): Payer: Medicare Other | Admitting: Family Medicine

## 2020-01-18 VITALS — BP 138/72 | HR 59 | Temp 97.1°F | Resp 16 | Ht 66.0 in | Wt 172.6 lb

## 2020-01-18 DIAGNOSIS — L219 Seborrheic dermatitis, unspecified: Secondary | ICD-10-CM

## 2020-01-18 MED ORDER — BETAMETHASONE VALERATE 0.12 % EX FOAM: 1.0000 "application " | Freq: Every day | CUTANEOUS | 0 refills | Status: DC

## 2020-01-18 MED ORDER — CICLOPIROX 1 % EX SHAM: 5.0000 mL | MEDICATED_SHAMPOO | CUTANEOUS | 1 refills | Status: DC

## 2020-01-18 NOTE — Progress Notes (Signed)
Subjective:    Patient ID: Ross Riley, male    DOB: Jul 25, 1950, 69 y.o.   MRN: [id number]  Ross Riley is a 69 y.o. male presenting on 01/18/2020 for brown spots (on forehead onset couple of weeks )   HPI   Seborrheic Dermatitis Scalp Reports new problem few weeks, on bilateral scalp, and top of head has dry flaky skin irritated areas.admits itchy symptom. Denies pain or spreading rash, or drainage or bleeding. No pain. Not using any topical treatment or medicated shampoo.   Depression screen [facility] 2/9 11/15/2019 05/24/2019 11/05/2018  Decreased Interest 0 0 0  Down, Depressed, Hopeless 0 0 0  PHQ - 2 Score 0 0 0  Altered sleeping - - -  Tired, decreased energy - - -  Change in appetite - - -  Feeling bad or failure about yourself  - - -  Trouble concentrating - - -  Moving slowly or fidgety/restless - - -  Suicidal thoughts - - -  PHQ-9 Score - - -  Difficult doing work/chores - - -    Social History   Tobacco Use  . Smoking status: Former Smoker    Packs/day: 1.00    Years: 32.00    Pack years: 32.00    Types: Cigarettes    Quit date: 03/04/1991    Years since quitting: 28.8  . Smokeless tobacco: Former Network engineer  . Vaping Use: Never used  Substance Use Topics  . Alcohol use: Yes    Alcohol/week: 0.0 standard drinks    Comment: occasional  . Drug use: No    Review of Systems Per HPI unless specifically indicated above     Objective:    BP 138/72   Pulse (!) 59   Temp (!) 97.1 F (36.2 C) (Temporal)   Resp 16   Ht 5\' 6"  (1.676 m)   Wt 172 lb 9.6 oz (78.3 kg)   SpO2 100%   BMI 27.86 kg/m   Wt Readings from Last 3 Encounters:  01/18/20 172 lb 9.6 oz (78.3 kg)  11/15/19 170 lb (77.1 kg)  06/09/19 171 lb (77.6 kg)    Physical Exam Vitals and nursing note reviewed.  Constitutional:      General: He is not in acute distress.    Appearance: He is well-developed. He is not diaphoretic.     Comments: Well-appearing, comfortable, cooperative   HENT:     Head: Normocephalic and atraumatic.  Eyes:     General:        Right eye: No discharge.        Left eye: No discharge.     Conjunctiva/sclera: Conjunctivae normal.  Cardiovascular:     Rate and Rhythm: Normal rate.  Pulmonary:     Effort: Pulmonary effort is normal.  Skin:    General: Skin is warm and dry.     Findings: Rash (multiple scaly patches on scalp bilateral sides and top, dry flaky, not inflamed) present. No erythema.  Neurological:     Mental Status: He is alert and oriented to person, place, and time.  Psychiatric:        Behavior: Behavior normal.     Comments: Well groomed, good eye contact, normal speech and thoughts      Results for orders placed or performed in visit on 11/24/19  Novel Coronavirus, NAA ([facility])   Specimen: Nasopharyngeal(NP) swabs in vial transport medium   Nasopharynge  Screenin  Result Value Ref Range   SARS-CoV-2, NAA  Not Detected Not Detected  SARS-COV-2, NAA 2 DAY TAT   Nasopharynge  Screenin  Result Value Ref Range   SARS-CoV-2, NAA 2 DAY TAT Performed       Assessment & Plan:   Problem List Items Addressed This Visit    None    Visit Diagnoses    Seborrheic dermatitis of scalp    -  Primary   Relevant Medications   Ciclopirox 1 % shampoo (Start on 01/19/2020)   Betamethasone Valerate 0.12 % foam      Clinically with several patches of seborrheic dermatitis of scalp with dry flaky itchy patches No sign of secondary significant inflammation or secondary bacterial infection  Trial on topical anti fungal ciclopirox 1% shampoo 2 times weekly for 4 weeks. 39mL apply, leave on for 5 minutes, rinse out thoroughly.  Apply betamethasone foam topical steroid high potency daily for 2 weeks  If meds are higher cost, or not covered we can switch, pharmacy can notify our office.  Follow-up if unresolved, can refer to Dermatology.  Meds ordered this encounter  Medications  . Ciclopirox 1 % shampoo    Sig: Apply 5 mLs  topically 2 (two) times a week. Leave shampoo on scalp for 5 minutes, then rinse off. Use for total of 4 weeks. May repeat if problem returns.    Dispense:  120 mL    Refill:  1  . Betamethasone Valerate 0.12 % foam    Sig: Apply 1 application topically daily. For 2 to 4 weeks, apply small amount of foam to scalp and rub in    Dispense:  50 g    Refill:  0      Follow up plan: Return in about 2 weeks (around 02/01/2020), or if symptoms worsen or fail to improve, for dermatitis if not improved.   [name], [facility] 01/18/2020, 11:14 AM

## 2020-01-18 NOTE — Patient Instructions (Addendum)
Thank you for coming to the office today.  Use Ciclopirox antifungal shampoo twice a week, (few days apart) apply for 5 minutes, then rinse off. Up to 4 weeks TOTAL. Can repeat course if need.  Use Betamethasone Foam steroid for itching and to heal the skin - use this once a day allow it to absorb and rub in, and this can be used for up to 2 weeks  If costly we can try to switch medicines with pharmacy.  FYI - We have sent paperwork to [facility] for the walker already today.  If not improving we can refer to Dermatologist.  Please schedule a Follow-up Appointment to: Return in about 2 weeks (around 02/01/2020), or if symptoms worsen or fail to improve, for dermatitis if not improved.  If you have any other questions or concerns, please feel free to call the office or send a message through [place]. You may also schedule an earlier appointment if necessary.  Additionally, you may be receiving a survey about your experience at our office within a few days to 1 week by e-mail or mail. We value your feedback.  [name], DO [place]

## 2020-01-19 ENCOUNTER — Telehealth: Payer: Self-pay

## 2020-01-19 DIAGNOSIS — L219 Seborrheic dermatitis, unspecified: Secondary | ICD-10-CM

## 2020-01-19 NOTE — Telephone Encounter (Signed)
Copied from [place] [phone]. Topic: General - Other >> Jan 19, 2020 12:28 PM Leward Quan A wrote: Reason for CRM: Patient daughter Dedra Skeens called in to inform Dr Raliegh Ip that Rx for Betamethasone Valerate 0.12 % foam is not covered asking what is the next step. Please call  Ph# [phone]

## 2020-01-20 MED ORDER — BETAMETHASONE VALERATE 0.1 % EX CREA: TOPICAL_CREAM | Freq: Two times a day (BID) | CUTANEOUS | 0 refills | Status: DC

## 2020-01-20 NOTE — Telephone Encounter (Signed)
Patient advised.

## 2020-01-20 NOTE — Telephone Encounter (Signed)
Please notify patient  Foam was not covered.  Sent new rx for cream instead of foam. If this is not covered, they may use goodrx for affordable price on this rx. But hopefully the cream is covered by his insurance.  betamethasone valerate (VALISONE) 0.1 % cream 2 times daily 0 ordered        Summary: Apply topically 2 (two) times daily. For 2-4 weeks, apply to areas of scalp., Starting Fri 01/20/2020, Normal  Dose, Route, Frequency: Topical, 2 times daily Start: 01/20/2020 Ord/Sold: 01/20/2020 (O) Report Taking:  Long-term:  Ph[facility]: [id number] - [place] [facility] [address] [facility] Dose History ChangeDiscontinue     Patient Sig: Apply topically 2 (two) times daily. For 2-4 weeks, apply to areas of scalp.     Ordered on: 01/20/2020     Authorized by: Olin Hauser     Dispense: 30 g     Admin Instructions: For 2-4 weeks, apply to areas of scalp.     Note to Pharmacy: Changed from Foam to Cream for coverage / cost.      Cream - same instructions, use 2 times a day for 2-4 weeks - just to the affected spots on scalp.  [name], [facility] 01/20/2020, 12:41 PM

## 2020-01-20 NOTE — Addendum Note (Signed)
Addended by: Olin Hauser on: 01/20/2020 12:41 PM   Modules accepted: Orders

## 2020-01-30 DIAGNOSIS — M25572 Pain in left ankle and joints of left foot: Secondary | ICD-10-CM | POA: Diagnosis not present

## 2020-01-30 DIAGNOSIS — I739 Peripheral vascular disease, unspecified: Secondary | ICD-10-CM | POA: Diagnosis not present

## 2020-01-30 DIAGNOSIS — M19072 Primary osteoarthritis, left ankle and foot: Secondary | ICD-10-CM | POA: Diagnosis not present

## 2020-01-30 DIAGNOSIS — M25372 Other instability, left ankle: Secondary | ICD-10-CM | POA: Diagnosis not present

## 2020-01-30 DIAGNOSIS — E119 Type 2 diabetes mellitus without complications: Secondary | ICD-10-CM | POA: Diagnosis not present

## 2020-02-07 ENCOUNTER — Other Ambulatory Visit: Payer: Self-pay | Admitting: Family Medicine

## 2020-02-07 NOTE — Telephone Encounter (Signed)
Requested Prescriptions  Pending Prescriptions Disp Refills  . omeprazole (PRILOSEC) 40 MG capsule [Pharmacy Med Name: OMEPRAZOLE 40MG  CAPSULES] 90 capsule 2    Sig: TAKE 1 CAPSULE BY MOUTH EVERY DAY BEFORE BREAKFAST 15 TO 30 MINUTES BEFORE FIRST MEAL OF THE DAY     Gastroenterology: Proton Pump Inhibitors Passed - 02/07/2020  3:22 PM      Passed - Valid encounter within last 12 months    Recent Outpatient Visits          2 weeks ago Seborrheic dermatitis of scalp   [facility] [place], Devonne Doughty, DO   2 months ago Annual physical exam   [facility] Olin Hauser, DO   8 months ago Suppurative hidradenitis   [facility] Olin Hauser, DO   9 months ago Pleuritic pain   [place], DO   10 months ago Type 2 diabetes mellitus with diabetic cataract, without long-term current use of insulin [facility])   [place], Devonne Doughty, DO

## 2020-02-29 ENCOUNTER — Other Ambulatory Visit: Payer: Self-pay | Admitting: Urology

## 2020-03-16 ENCOUNTER — Other Ambulatory Visit: Payer: Self-pay

## 2020-03-16 ENCOUNTER — Encounter: Payer: Self-pay | Admitting: Family Medicine

## 2020-03-16 ENCOUNTER — Ambulatory Visit (INDEPENDENT_AMBULATORY_CARE_PROVIDER_SITE_OTHER): Payer: Medicare Other | Admitting: Family Medicine

## 2020-03-16 VITALS — BP 164/90 | HR 54 | Ht 66.0 in | Wt 175.0 lb

## 2020-03-16 DIAGNOSIS — H9202 Otalgia, left ear: Secondary | ICD-10-CM | POA: Diagnosis not present

## 2020-03-16 DIAGNOSIS — N529 Male erectile dysfunction, unspecified: Secondary | ICD-10-CM

## 2020-03-16 DIAGNOSIS — H6982 Other specified disorders of Eustachian tube, left ear: Secondary | ICD-10-CM

## 2020-03-16 MED ORDER — PREDNISONE 20 MG PO TABS: ORAL_TABLET | ORAL | 0 refills | Status: DC

## 2020-03-16 MED ORDER — SILDENAFIL CITRATE 100 MG PO TABS: 100.0000 mg | ORAL_TABLET | Freq: Every day | ORAL | 2 refills | Status: DC | PRN

## 2020-03-16 NOTE — Patient Instructions (Addendum)
Thank you for coming to the office today.  For Left ear, no sign of infection. There is some clear fluid behind ear. Can try Prednisone rx  May also try the decongestant such as Sudafed temporarily. It can raise blood pressure, use with caution.  Refilled Sildenafil to [facility].  Please schedule a Follow-up Appointment to: Return if symptoms worsen or fail to improve.  If you have any other questions or concerns, please feel free to call the office or send a message through [place]. You may also schedule an earlier appointment if necessary.  Additionally, you may be receiving a survey about your experience at our office within a few days to 1 week by e-mail or mail. We value your feedback.  [name], DO [place]

## 2020-03-16 NOTE — Progress Notes (Signed)
Subjective:    Patient ID: Ross Riley, male    DOB: 04/27/1950, 70 y.o.   MRN: [id number]  Ross Riley is a 70 y.o. male presenting on 03/16/2020 for Ear Pain (Left ear)   HPI  Left Ear Pain Admits pressure pain sensation in Left ear. Feels warm at times. History of sinus issues, but no sinus drainage or congestion. He has used nasal spray in past, without results. Denies hearing loss, ear drainage, injury, tinnitus  Erectile Dysfunction Needs refill Sildenafil 100mg  generic to [facility], takes PRN with good results, failed 20mg  in past.  Health Maintenance: UTD on COVID Vaccine, needs Booster soon. Pfizer  Depression screen [facility] 2/9 11/15/2019 05/24/2019 11/05/2018  Decreased Interest 0 0 0  Down, Depressed, Hopeless 0 0 0  PHQ - 2 Score 0 0 0  Altered sleeping - - -  Tired, decreased energy - - -  Change in appetite - - -  Feeling bad or failure about yourself  - - -  Trouble concentrating - - -  Moving slowly or fidgety/restless - - -  Suicidal thoughts - - -  PHQ-9 Score - - -  Difficult doing work/chores - - -    Social History   Tobacco Use  . Smoking status: Former Smoker    Packs/day: 1.00    Years: 32.00    Pack years: 32.00    Types: Cigarettes    Quit date: 03/04/1991    Years since quitting: 29.0  . Smokeless tobacco: Former Network engineer  . Vaping Use: Never used  Substance Use Topics  . Alcohol use: Yes    Alcohol/week: 0.0 standard drinks    Comment: occasional  . Drug use: No    Review of Systems Per HPI unless specifically indicated above     Objective:    BP (!) 164/90   Pulse (!) 54   Ht 5\' 6"  (1.676 m)   Wt 175 lb (79.4 kg)   SpO2 100%   BMI 28.25 kg/m   Wt Readings from Last 3 Encounters:  03/16/20 175 lb (79.4 kg)  01/18/20 172 lb 9.6 oz (78.3 kg)  11/15/19 170 lb (77.1 kg)    Physical Exam Vitals and nursing note reviewed.  Constitutional:      General: He is not in acute distress.    Appearance: He is  well-developed and well-nourished. He is not diaphoretic.     Comments: Well-appearing, comfortable, cooperative  HENT:     Head: Normocephalic and atraumatic.     Right Ear: Tympanic membrane, ear canal and external ear normal. There is no impacted cerumen.     Left Ear: Ear canal and external ear normal. There is no impacted cerumen.     Ears:     Comments: Mild effusion L TM no erythema or bulging    Mouth/Throat:     Mouth: Oropharynx is clear and moist.  Eyes:     General:        Right eye: No discharge.        Left eye: No discharge.     Conjunctiva/sclera: Conjunctivae normal.  Cardiovascular:     Rate and Rhythm: Normal rate.  Pulmonary:     Effort: Pulmonary effort is normal.  Musculoskeletal:        General: No edema.  Skin:    General: Skin is warm and dry.     Findings: No erythema or rash.  Neurological:     Mental Status: He is  alert and oriented to person, place, and time.  Psychiatric:        Mood and Affect: Mood and affect normal.        Behavior: Behavior normal.     Comments: Well groomed, good eye contact, normal speech and thoughts    Results for orders placed or performed in visit on 11/24/19  Novel Coronavirus, NAA ([facility])   Specimen: Nasopharyngeal(NP) swabs in vial transport medium   Nasopharynge  Screenin  Result Value Ref Range   SARS-CoV-2, NAA Not Detected Not Detected  SARS-COV-2, NAA 2 DAY TAT   Nasopharynge  Screenin  Result Value Ref Range   SARS-CoV-2, NAA 2 DAY TAT Performed       Assessment & Plan:   Problem List Items Addressed This Visit    ED (erectile dysfunction) of organic origin - Primary   Relevant Medications   sildenafil (VIAGRA) 100 MG tablet    Other Visit Diagnoses    Left ear pain       Relevant Medications   predniSONE (DELTASONE) 20 MG tablet   Dysfunction of left eustachian tube       Relevant Medications   predniSONE (DELTASONE) 20 MG tablet      Acute L eustachian tube dysfunction with secondary L  ear effusion, without loss of hearing or evidence of AOM or sinusitis. Likely related allergic rhinosinusitis based on exam and history. - Inadequate conservative therapy  Failed nasal steroids in past  Plan: 1. Trial short course prednisone 40mg  x 2 days and 20mg  x 3 days for pain pressure effusion 2. May resume nasal sprays, OTC decongestant, anti histamine Follow-up if not improved or worsening, return criteria  #ED Refill Sildenafil 100mg  PRN   Meds ordered this encounter  Medications  . sildenafil (VIAGRA) 100 MG tablet    Sig: Take 1 tablet (100 mg total) by mouth daily as needed for erectile dysfunction. Take two hours prior to intercourse on an empty stomach    Dispense:  30 tablet    Refill:  2  . predniSONE (DELTASONE) 20 MG tablet    Sig: Take 2 tablets (40mg ) with meal daily for 2 days, then take 1 tablet (20mg ) daily with meal for 3 days.    Dispense:  7 tablet    Refill:  0      Follow up plan: Return if symptoms worsen or fail to improve.   [name], [facility] 03/16/2020, 4:20 PM

## 2020-03-23 DIAGNOSIS — Z20822 Contact with and (suspected) exposure to covid-19: Secondary | ICD-10-CM | POA: Diagnosis not present

## 2020-03-26 DIAGNOSIS — H401132 Primary open-angle glaucoma, bilateral, moderate stage: Secondary | ICD-10-CM | POA: Diagnosis not present

## 2020-04-02 ENCOUNTER — Other Ambulatory Visit: Payer: Self-pay | Admitting: Family Medicine

## 2020-04-02 DIAGNOSIS — I10 Essential (primary) hypertension: Secondary | ICD-10-CM

## 2020-04-02 NOTE — Telephone Encounter (Signed)
Requested Prescriptions  Pending Prescriptions Disp Refills  . metoprolol tartrate (LOPRESSOR) 25 MG tablet [Pharmacy Med Name: METOPROLOL TARTRATE 25MG  TABLETS] 90 tablet 0    Sig: TAKE 1/2 TABLET(12.5 MG) BY MOUTH TWICE DAILY     Cardiovascular:  Beta Blockers Failed - 04/02/2020  7:02 AM      Failed - Last BP in normal range    BP Readings from Last 1 Encounters:  03/16/20 (!) 164/90         Passed - Last Heart Rate in normal range    Pulse Readings from Last 1 Encounters:  03/16/20 (!) 54         Passed - Valid encounter within last 6 months    Recent Outpatient Visits          2 weeks ago ED (erectile dysfunction) of organic origin   [place], DO   2 months ago Seborrheic dermatitis of scalp   [place], DO   4 months ago Annual physical exam   [place], DO   10 months ago Suppurative hidradenitis   [place], DO   10 months ago Pleuritic pain   [place], Devonne Doughty, [place]

## 2020-04-09 DIAGNOSIS — E039 Hypothyroidism, unspecified: Secondary | ICD-10-CM | POA: Diagnosis not present

## 2020-04-09 DIAGNOSIS — E782 Mixed hyperlipidemia: Secondary | ICD-10-CM | POA: Diagnosis not present

## 2020-04-09 DIAGNOSIS — I10 Essential (primary) hypertension: Secondary | ICD-10-CM | POA: Diagnosis not present

## 2020-04-09 DIAGNOSIS — E119 Type 2 diabetes mellitus without complications: Secondary | ICD-10-CM | POA: Diagnosis not present

## 2020-04-09 LAB — HEMOGLOBIN A1C: Hemoglobin A1C: 6.8

## 2020-05-21 ENCOUNTER — Ambulatory Visit: Payer: Self-pay | Admitting: *Deleted

## 2020-05-21 NOTE — Telephone Encounter (Signed)
Patient states she has had cough/comgestion for 1 week. COVID test 2 days ago- negative. Virtual visit scheduled for patient.  Reason for Disposition . [1] Continuous (nonstop) coughing interferes with work or school AND [2] no improvement using cough treatment per Care Advice  Answer Assessment - Initial Assessment Questions 1. ONSET: "When did the cough begin?"      1 week 2. SEVERITY: "How bad is the cough today?"      Cough making chest hurt 3. SPUTUM: "Describe the color of your sputum" (none, dry cough; clear, white, yellow, green)     yellow 4. HEMOPTYSIS: "Are you coughing up any blood?" If so ask: "How much?" (flecks, streaks, tablespoons, etc.)     No blood 5. DIFFICULTY BREATHING: "Are you having difficulty breathing?" If Yes, ask: "How bad is it?" (e.g., mild, moderate, severe)    - MILD: No SOB at rest, mild SOB with walking, speaks normally in sentences, can lay down, no retractions, pulse < 100.    - MODERATE: SOB at rest, SOB with minimal exertion and prefers to sit, cannot lie down flat, speaks in phrases, mild retractions, audible wheezing, pulse 100-120.    - SEVERE: Very SOB at rest, speaks in single words, struggling to breathe, sitting hunched forward, retractions, pulse > 120      mild 6. FEVER: "Do you have a fever?" If Yes, ask: "What is your temperature, how was it measured, and when did it start?"    Cold spells- normal temperature 7. CARDIAC HISTORY: "Do you have any history of heart disease?" (e.g., heart attack, congestive heart failure)      no 8. LUNG HISTORY: "Do you have any history of lung disease?"  (e.g., pulmonary embolus, asthma, emphysema)     no 9. PE RISK FACTORS: "Do you have a history of blood clots?" (or: recent major surgery, recent prolonged travel, bedridden)     no 10. OTHER SYMPTOMS: "Do you have any other symptoms?" (e.g., runny nose, wheezing, chest pain)       Runny nose, chest soreness from cough 11. PREGNANCY: "Is there any chance  you are pregnant?" "When was your last menstrual period?"       n/a 12. TRAVEL: "Have you traveled out of the country in the last month?" (e.g., travel history, exposures)      Patient states COVID test negative 2 days ago  Protocols used: [place]

## 2020-05-22 ENCOUNTER — Other Ambulatory Visit: Payer: Self-pay

## 2020-05-22 ENCOUNTER — Encounter: Payer: Self-pay | Admitting: Family Medicine

## 2020-05-22 ENCOUNTER — Telehealth (INDEPENDENT_AMBULATORY_CARE_PROVIDER_SITE_OTHER): Payer: Medicare Other | Admitting: Family Medicine

## 2020-05-22 VITALS — Ht 66.0 in | Wt 172.0 lb

## 2020-05-22 DIAGNOSIS — J209 Acute bronchitis, unspecified: Secondary | ICD-10-CM

## 2020-05-22 MED ORDER — ALBUTEROL SULFATE HFA 108 (90 BASE) MCG/ACT IN AERS: 2.0000 | INHALATION_SPRAY | RESPIRATORY_TRACT | 0 refills | Status: DC | PRN

## 2020-05-22 MED ORDER — DM-GUAIFENESIN ER 30-600 MG PO TB12: 1.0000 | ORAL_TABLET | Freq: Two times a day (BID) | ORAL | 0 refills | Status: DC

## 2020-05-22 MED ORDER — PSEUDOEPH-BROMPHEN-DM 30-2-10 MG/5ML PO SYRP: 5.0000 mL | ORAL_SOLUTION | Freq: Four times a day (QID) | ORAL | 0 refills | Status: DC | PRN

## 2020-05-22 NOTE — Progress Notes (Signed)
Subjective:    Patient ID: [name], male    DOB: 04/13/1950, 70 y.o.   MRN: [id number]  Ross Riley is a 70 y.o. male presenting on 05/22/2020 for Cough and Nasal Congestion  Virtual / Telehealth Encounter - Video Visit via MyChart The purpose of this virtual visit is to provide medical care while limiting exposure to the novel coronavirus (COVID19) for both patient and office staff.  Consent was obtained for remote visit:  Yes.   Answered questions that patient had about telehealth interaction:  Yes.   I discussed the limitations, risks, security and privacy concerns of performing an evaluation and management service by video/telephone. I also discussed with the patient that there may be a patient responsible charge related to this service. The patient expressed understanding and agreed to proceed.  Patient Location: Home Provider Location: [facility] (Office)  Participants in virtual visit: - Patient: Ross Riley. Darlina Sicilian / Daughter Ross Riley accompanied on video call. - CMA: Orinda Kenner, CMA - Provider: Dr Parks Ranger   HPI   Acute Bronchitis Productive Cough / Sore chest with cough Reports onset 1 week ago Tried Aleve PRN Never used inhaler PRN. No history of asthma or COPD Not tried any cough medicine OTC. In past cough syrup has helped bronchitis. No other sick contacts Admits some chills and body aches Denies any fever, dyspnea, nausea vomiting   Health Maintenance:  COVID booster updated  Depression screen [facility] 2/9 11/15/2019 05/24/2019 11/05/2018  Decreased Interest 0 0 0  Down, Depressed, Hopeless 0 0 0  PHQ - 2 Score 0 0 0  Altered sleeping - - -  Tired, decreased energy - - -  Change in appetite - - -  Feeling bad or failure about yourself  - - -  Trouble concentrating - - -  Moving slowly or fidgety/restless - - -  Suicidal thoughts - - -  PHQ-9 Score - - -  Difficult doing work/chores - - -    Social History   Tobacco Use   . Smoking status: Former Smoker    Packs/day: 1.00    Years: 32.00    Pack years: 32.00    Types: Cigarettes    Quit date: 03/04/1991    Years since quitting: 29.2  . Smokeless tobacco: Former Network engineer  . Vaping Use: Never used  Substance Use Topics  . Alcohol use: Yes    Alcohol/week: 0.0 standard drinks    Comment: occasional  . Drug use: No    Review of Systems Per HPI unless specifically indicated above     Objective:    Ht 5\' 6"  (1.676 m)   Wt 172 lb (78 kg)   BMI 27.76 kg/m   Wt Readings from Last 3 Encounters:  05/22/20 172 lb (78 kg)  03/16/20 175 lb (79.4 kg)  01/18/20 172 lb 9.6 oz (78.3 kg)    Physical Exam   Note examination was completely remotely via video observation objective data only  Gen - well-appearing, no acute distress or apparent pain, comfortable HEENT - eyes appear clear without discharge or redness Heart/Lungs - cannot examine virtually - observed coughing episodic Abd - cannot examine virtually  Skin - face visible today- no rash Neuro - awake, alert, oriented Psych - not anxious appearing   Results for orders placed or performed in visit on 11/24/19  Novel Coronavirus, NAA ([facility])   Specimen: Nasopharyngeal(NP) swabs in vial transport medium   Nasopharynge  Screenin  Result  Value Ref Range   SARS-CoV-2, NAA Not Detected Not Detected  SARS-COV-2, NAA 2 DAY TAT   Nasopharynge  Screenin  Result Value Ref Range   SARS-CoV-2, NAA 2 DAY TAT Performed       Assessment & Plan:   Problem List Items Addressed This Visit   None   Visit Diagnoses    Acute bronchitis, unspecified organism    -  Primary   Relevant Medications   brompheniramine-pseudoephedrine-DM 30-2-10 MG/5ML syrup   albuterol (VENTOLIN HFA) 108 (90 Base) MCG/ACT inhaler   dextromethorphan-guaiFENesin (MUCINEX DM) 30-600 MG 12hr tablet      Consistent with worsening bronchitis in setting of likely viral URI Duration about 1 week at this time Afebrile  but had some consitutional symptoms at onset Afebrile, no focal signs of infection by history but seems more lower respiratory UTD COVID Vaccine including booster No COPD asthma  Plan: 1. Reassurance, continue supportive measures  2. Start Bromfed cough syrup has helped before 3.  Trial on Albuterol inhaler 2 puffs q 4-6 hour x 3-5 days regularly (discussed proper use, advised to ask pharmacist for demonstration) 4. Mucinex BID 1-2 week 5. Return criteria reviewed, follow-up within 1 week if not improved   Meds ordered this encounter  Medications  . brompheniramine-pseudoephedrine-DM 30-2-10 MG/5ML syrup    Sig: Take 5 mLs by mouth 4 (four) times daily as needed.    Dispense:  236 mL    Refill:  0  . albuterol (VENTOLIN HFA) 108 (90 Base) MCG/ACT inhaler    Sig: Inhale 2 puffs into the lungs every 4 (four) hours as needed for shortness of breath (cough).    Dispense:  6.7 each    Refill:  0  . dextromethorphan-guaiFENesin (MUCINEX DM) 30-600 MG 12hr tablet    Sig: Take 1 tablet by mouth 2 (two) times daily. For up to 2 weeks    Dispense:  30 tablet    Refill:  0      Follow up plan: Return if symptoms worsen or fail to improve.   Patient verbalizes understanding with the above medical recommendations including the limitation of remote medical advice.  Specific follow-up and call-back criteria were given for patient to follow-up or seek medical care more urgently if needed.  Total duration of direct patient care provided via video conference: 10 minutes   [name], [facility] 05/22/2020, 4:42 PM

## 2020-05-22 NOTE — Patient Instructions (Addendum)
1. Reassurance, continue supportive measures  2. Start Bromfed cough syrup has helped before 3.  Trial on Albuterol inhaler 2 puffs q 4-6 hour x 3-5 days regularly (discussed proper use, advised to ask pharmacist for demonstration) 4. Mucinex BID 1-2 week  If not improved by 48 hours can contact office and notify us we can order antibiotic or other therapy if needed.  If your symptoms seem to worsen instead of improve over next several days, including significant fever / chills, worsening shortness of breath, worsening wheezing, or nausea / vomiting and can't take medicines - return sooner or go to hospital Emergency Department for more immediate treatment.   Please schedule a Follow-up Appointment to: Return if symptoms worsen or fail to improve.  If you have any other questions or concerns, please feel free to call the office or send a message through [place]. You may also schedule an earlier appointment if necessary.  Additionally, you may be receiving a survey about your experience at our office within a few days to 1 week by e-mail or mail. We value your feedback.  [name], DO [place]

## 2020-05-29 ENCOUNTER — Ambulatory Visit: Payer: Medicare Other

## 2020-07-01 ENCOUNTER — Other Ambulatory Visit: Payer: Self-pay | Admitting: Family Medicine

## 2020-07-01 DIAGNOSIS — I10 Essential (primary) hypertension: Secondary | ICD-10-CM

## 2020-07-01 NOTE — Telephone Encounter (Signed)
Last OV addressing HTN 11/15/19. Refill program indicated OV within 6 months.  Last RF 04/02/20. Needs appt.  #30 day courtesy Refill needed. Please review if appropriate.  MyChart message sent to pt in Spanish.

## 2020-07-18 ENCOUNTER — Encounter: Payer: Self-pay | Admitting: Family Medicine

## 2020-07-18 ENCOUNTER — Other Ambulatory Visit: Payer: Self-pay

## 2020-07-18 ENCOUNTER — Ambulatory Visit: Payer: Self-pay

## 2020-07-18 ENCOUNTER — Ambulatory Visit (INDEPENDENT_AMBULATORY_CARE_PROVIDER_SITE_OTHER): Payer: Medicare Other | Admitting: Family Medicine

## 2020-07-18 VITALS — BP 152/78 | HR 57 | Ht 66.0 in | Wt 172.8 lb

## 2020-07-18 DIAGNOSIS — R110 Nausea: Secondary | ICD-10-CM

## 2020-07-18 DIAGNOSIS — H8113 Benign paroxysmal vertigo, bilateral: Secondary | ICD-10-CM | POA: Diagnosis not present

## 2020-07-18 MED ORDER — ONDANSETRON 4 MG PO TBDP
4.0000 mg | ORAL_TABLET | Freq: Three times a day (TID) | ORAL | 2 refills | Status: DC | PRN
Start: 2020-07-18 — End: 2022-04-25

## 2020-07-18 MED ORDER — MECLIZINE HCL 25 MG PO TABS: 25.0000 mg | ORAL_TABLET | Freq: Three times a day (TID) | ORAL | 2 refills | Status: DC | PRN

## 2020-07-18 NOTE — Patient Instructions (Addendum)
Thank you for coming to the office today.  1. You have symptoms of Vertigo (Benign Paroxysmal Positional Vertigo) - This is commonly caused by inner ear fluid imbalance, sometimes can be worsened by allergies and sinus symptoms, otherwise it can occur randomly sometimes and we may never discover the exact cause. - To treat this, try the Epley Manuever (see diagrams/instructions below) at home up to 3 times a day for 1-2 weeks or until symptoms resolve - You may take Meclizine as needed up to 3 times a day for dizziness, this will not cure symptoms but may help. Caution may make you drowsy.  Take Zofran as needed for nausea  Ear looks fine without any problem there.  If you develop significant worsening episode with vertigo that does not improve and you get severe headache, loss of vision, arm or leg weakness, slurred speech, or other concerning symptoms please seek immediate medical attention at Emergency Department.   See the next page for images describing the Epley Manuever.     ----------------------------------------------------------------------------------------------------------------------        Vrtigo Vertigo El vrtigo es la sensacin de que usted o las cosas que lo rodean se mueven cuando en realidad eso no sucede. Esta sensacin puede aparecer y desaparecer en cualquier momento. El vrtigo suele desaparecer solo. Esta afeccin puede ser peligrosa si ocurre cuando est realizando actividades como conducir o trabajar con mquinas. El mdico le har pruebas para encontrar la causa del vrtigo. Estas pruebas tambin ayudarn al mdico a decidir cul es el mejor tratamiento para usted. Siga estas indicaciones en su casa: Comida y bebida  Beba suficiente lquido para mantener el pis (la [place]) de color amarillo plido.  No beba alcohol.      Actividad  Retome sus actividades habituales como se lo haya indicado el mdico. Pregntele al mdico qu actividades son  seguras para usted.  Por la maana, sintese primero a un lado de la cama. Cuando se sienta bien, pngase lentamente de pie mientras se sostiene de algo, hasta que sepa que ha logrado el equilibrio.  Muvase lentamente. Evite determinadas posiciones o hacer movimientos repentinos del cuerpo o de la cabeza, como se lo haya indicado el mdico.  Use un bastn si tiene dificultad para ponerse de pie o caminar.  Si se siente mareado, sintese de inmediato.  Evite realizar tareas o actividades que puedan causarle peligro a usted o a Producer, television/film/video si se marea.  Evite agacharse si se siente mareado. En su casa, coloque los objetos de modo que le resulte fcil alcanzarlos sin Office manager.  No conduzca vehculos ni opere maquinaria pesada si se siente mareado. Indicaciones generales  [employer] de venta libre y los recetados solamente como se lo haya indicado el mdico.  Consulting civil engineer a todas las visitas de control como se lo haya indicado el mdico. Esto es importante. Comunquese con un mdico si:  Los medicamentos no le [employer] vrtigo.  Tiene fiebre.  Los problemas empeoran o le aparecen sntomas nuevos.  Sus familiares o amigos observan cambios en su comportamiento.  La sensacin de Air cabin crew.  Los vmitos empeoran.  Pierde la sensibilidad (tiene entumecimiento) en alguna parte del cuerpo.  Siente pinchazos u hormigueo en alguna parte del cuerpo. Solicite ayuda inmediatamente si:  Tiene dificultad para moverse o para caminar.  Esta mareado todo [employer].  Pierde el conocimiento (se desmaya).  Tiene dolores de [place] muy intensos.  Siente debilidad [employer], los brazos o las piernas.  Tiene cambios  en la audicin.  Tiene cambios en la forma de ver (visin).  Tiene rigidez en el cuello.  La luz brillante empieza a molestarlo. Resumen  El vrtigo es la sensacin de que usted o las cosas que lo rodean se mueven cuando en realidad eso no  sucede.  El mdico le har pruebas para encontrar la causa del vrtigo.  Tal vez le indiquen que evite algunas tareas, posiciones o movimientos.  Comunquese con un mdico si los medicamentos no lo ayudan o si tiene fiebre, sntomas nuevos o un cambio en el comportamiento.  Pida ayuda de inmediato si tiene dolores de cabeza muy intensos o si tiene cambios en la manera de hablar, or o ver. Esta informacin no tiene Marine scientist el consejo del mdico. Asegrese de hacerle al mdico cualquier pregunta que tenga. Document Revised: 03/08/2018 Document Reviewed: 03/08/2018 Elsevier Patient Education  [place].    Please schedule a Follow-up Appointment to: Return if symptoms worsen or fail to improve, for vertigo.  If you have any other questions or concerns, please feel free to call the office or send a message through [place]. You may also schedule an earlier appointment if necessary.  Additionally, you may be receiving a survey about your experience at our office within a few days to 1 week by e-mail or mail. We value your feedback.  [name], DO [place]

## 2020-07-18 NOTE — Progress Notes (Signed)
Subjective:    Patient ID: Ross Riley, male    DOB: 1950-10-28, 70 y.o.   MRN: [id number]  Ross Riley is a 70 y.o. male presenting on 07/18/2020 for Dizziness, Headache, and Nausea  Patient presents for a same day appointment.  HPI   Headache Vertigo Nausea Left ear hot, without pain. Ross Riley has had prior history of vertigo in past, previously treated with meclizine. No recent sinusitis, last was 05/2020 has resolved. Ross Riley has no cough congestion or fever or chills. Ross Riley has no ear pain or drainage or other injury. Ross Riley admits room spinning movement dizzy and has some associated headache and nausea without any vomiting. Out of meclizine Denies any numbness tingling  Depression screen [facility] 2/9 11/15/2019 05/24/2019 11/05/2018  Decreased Interest 0 0 0  Down, Depressed, Hopeless 0 0 0  PHQ - 2 Score 0 0 0  Altered sleeping - - -  Tired, decreased energy - - -  Change in appetite - - -  Feeling bad or failure about yourself  - - -  Trouble concentrating - - -  Moving slowly or fidgety/restless - - -  Suicidal thoughts - - -  PHQ-9 Score - - -  Difficult doing work/chores - - -    Social History   Tobacco Use  . Smoking status: Former Smoker    Packs/day: 1.00    Years: 32.00    Pack years: 32.00    Types: Cigarettes    Quit date: 03/04/1991    Years since quitting: 29.3  . Smokeless tobacco: Former Network engineer  . Vaping Use: Never used  Substance Use Topics  . Alcohol use: Yes    Alcohol/week: 0.0 standard drinks    Comment: occasional  . Drug use: No    Review of Systems Per HPI unless specifically indicated above     Objective:    BP (!) 152/78   Pulse (!) 57   Ht 5\' 6"  (1.676 m)   Wt 172 lb 12.8 oz (78.4 kg)   SpO2 100%   BMI 27.89 kg/m   Wt Readings from Last 3 Encounters:  07/18/20 172 lb 12.8 oz (78.4 kg)  05/22/20 172 lb (78 kg)  03/16/20 175 lb (79.4 kg)    Physical Exam Vitals and nursing note reviewed.  Constitutional:      General: Ross Riley.    Appearance: Ross Riley is well-developed. Ross Riley is not diaphoretic.     Comments: Well-appearing, comfortable, cooperative  HENT:     Head: Normocephalic and atraumatic.  Eyes:     General:        Right eye: No discharge.        Left eye: No discharge.     Conjunctiva/sclera: Conjunctivae normal.  Neck:     Thyroid: No thyromegaly.  Cardiovascular:     Rate and Rhythm: Normal rate and regular rhythm.     Heart sounds: Normal heart sounds. No murmur heard.   Pulmonary:     Effort: Pulmonary effort is normal. No respiratory Riley.     Breath sounds: Normal breath sounds. No wheezing or rales.  Musculoskeletal:        General: Normal range of motion.     Cervical back: Normal range of motion and neck supple.  Lymphadenopathy:     Cervical: No cervical adenopathy.  Skin:    General: Skin is warm and dry.     Findings: No erythema or rash.  Neurological:  Mental Status: Ross Riley is alert and oriented to person, place, and time.  Psychiatric:        Behavior: Behavior normal.     Comments: Well groomed, good eye contact, normal speech and thoughts       Results for orders placed or performed in visit on 11/24/19  Novel Coronavirus, NAA ([facility])   Specimen: Nasopharyngeal(NP) swabs in vial transport medium   Nasopharynge  Screenin  Result Value Ref Range   SARS-CoV-2, NAA Not Detected Not Detected  SARS-COV-2, NAA 2 DAY TAT   Nasopharynge  Screenin  Result Value Ref Range   SARS-CoV-2, NAA 2 DAY TAT Performed       Assessment & Plan:   Problem List Items Addressed This Visit    BPPV (benign paroxysmal positional vertigo), bilateral - Primary   Relevant Medications   meclizine (ANTIVERT) 25 MG tablet   ondansetron (ZOFRAN ODT) 4 MG disintegrating tablet    Other Visit Diagnoses    Nausea       Relevant Medications   ondansetron (ZOFRAN ODT) 4 MG disintegrating tablet      Suspected bilateral BPPV by history supported by history - No other  significant neurological findings or focal deficits  Plan: 1. Handout given with Epley maneuver TID for 1-2 weeks until resolved 2. Rx meclizine PRN for breakthrough symptoms 3. Rx Zofran ODT PRN nausea Return criteria, if not improved consider vestibular PT referral vs Neuro vs ENT   Meds ordered this encounter  Medications  . meclizine (ANTIVERT) 25 MG tablet    Sig: Take 1 tablet (25 mg total) by mouth 3 (three) times daily as needed for dizziness.    Dispense:  60 tablet    Refill:  2  . ondansetron (ZOFRAN ODT) 4 MG disintegrating tablet    Sig: Take 1 tablet (4 mg total) by mouth every 8 (eight) hours as needed for nausea or vomiting.    Dispense:  30 tablet    Refill:  2      Follow up plan: Return if symptoms worsen or fail to improve, for vertigo.   [name], [facility] 07/18/2020, 4:09 PM

## 2020-07-18 NOTE — Telephone Encounter (Signed)
Pt. Reports he started having dizziness Monday. Worse when he moves his head. No other symptoms.Pt. has an appointment For today. Answer Assessment - Initial Assessment Questions 1. DESCRIPTION: "Describe your dizziness."     Dizzy 2. LIGHTHEADED: "Do you feel lightheaded?" (e.g., somewhat faint, woozy, weak upon standing)     Woozy 3. VERTIGO: "Do you feel like either you or the room is spinning or tilting?" (i.e. vertigo)     No 4. SEVERITY: "How bad is it?"  "Do you feel like you are going to faint?" "Can you stand and walk?"   - MILD: Feels slightly dizzy, but walking normally.   - MODERATE: Feels unsteady when walking, but not falling; interferes with normal activities (e.g., school, work).   - SEVERE: Unable to walk without falling, or requires assistance to walk without falling; feels like passing out now.      Moderate 5. ONSET:  "When did the dizziness begin?"     Monday 6. AGGRAVATING FACTORS: "Does anything make it worse?" (e.g., standing, change in head position)     Moving head 7. HEART RATE: "Can you tell me your heart rate?" "How many beats in 15 seconds?"  (Note: not all patients can do this)       No 8. CAUSE: "What do you think is causing the dizziness?"     Yes 9. RECURRENT SYMPTOM: "Have you had dizziness before?" If Yes, ask: "When was the last time?" "What happened that time?"     Yes 10. OTHER SYMPTOMS: "Do you have any other symptoms?" (e.g., fever, chest pain, vomiting, diarrhea, bleeding)       No 11. PREGNANCY: "Is there any chance you are pregnant?" "When was your last menstrual period?"       n/a  Protocols used: DIZZINESS [facility]

## 2020-07-24 ENCOUNTER — Telehealth: Payer: Self-pay

## 2020-07-24 ENCOUNTER — Ambulatory Visit: Payer: Medicare Other

## 2020-07-24 NOTE — Telephone Encounter (Signed)
This nurse attempted to contact patient three times via the interpreter service with interpreter [facility] ID# [phone]. Message left that we will attempt to call back in order to reschedule for another time.

## 2020-07-26 ENCOUNTER — Other Ambulatory Visit: Payer: Self-pay | Admitting: Family Medicine

## 2020-07-26 DIAGNOSIS — E1136 Type 2 diabetes mellitus with diabetic cataract: Secondary | ICD-10-CM

## 2020-07-26 NOTE — Telephone Encounter (Signed)
Requested Prescriptions  Pending Prescriptions Disp Refills  . glipiZIDE (GLUCOTROL XL) 5 MG 24 hr tablet [Pharmacy Med Name: GLIPIZIDE ER 5MG  TABLETS] 90 tablet 3    Sig: TAKE 1 TABLET(5 MG) BY MOUTH DAILY WITH BREAKFAST     Endocrinology:  Diabetes - Sulfonylureas Failed - 07/26/2020  7:12 AM      Failed - HBA1C is between 0 and 7.9 and within 180 days    Hemoglobin A1C  Date Value Ref Range Status  04/26/2018 6.6  Final    Comment:    [facility] Everywhere   Hgb A1c MFr Bld  Date Value Ref Range Status  11/09/2019 6.4 (H) <5.7 % of total Hgb Final    Comment:    For someone without known diabetes, a hemoglobin  A1c value between 5.7% and 6.4% is consistent with prediabetes and should be confirmed with a  follow-up test. . For someone with known diabetes, a value <7% indicates that their diabetes is well controlled. A1c targets should be individualized based on duration of diabetes, age, comorbid conditions, and other considerations. . This assay result is consistent with an increased risk of diabetes. . Currently, no consensus exists regarding use of hemoglobin A1c for diagnosis of diabetes for children. Renella Cunas - Valid encounter within last 6 months    Recent Outpatient Visits          1 week ago BPPV (benign paroxysmal positional vertigo), bilateral   [place], DO   2 months ago Acute bronchitis, unspecified organism   [place], DO   4 months ago ED (erectile dysfunction) of organic origin   [place], DO   6 months ago Seborrheic dermatitis of scalp   [place], DO   8 months ago Annual physical exam   [place], Devonne Doughty, DO

## 2020-08-21 ENCOUNTER — Telehealth: Payer: Self-pay | Admitting: Family Medicine

## 2020-08-21 NOTE — Telephone Encounter (Signed)
Copied from [place] [id number]. Topic: Medicare AWV >> Aug 21, 2020  3:34 PM Cher Nakai R wrote: Reason for CRM:  Left message for patient to call back and schedule the Medicare Annual Wellness Visit (AWV) virtually or by telephone.  Last AWV: 05/24/2019  Please schedule at anytime with [place].  40 minute appointment  Any questions, please call me at [phone]

## 2020-09-08 LAB — HM HEPATITIS C SCREENING LAB: HM Hepatitis Screen: NEGATIVE

## 2020-09-18 ENCOUNTER — Encounter: Payer: Self-pay | Admitting: Family Medicine

## 2020-09-29 ENCOUNTER — Other Ambulatory Visit: Payer: Self-pay | Admitting: Family Medicine

## 2020-09-29 DIAGNOSIS — I10 Essential (primary) hypertension: Secondary | ICD-10-CM

## 2020-09-29 NOTE — Telephone Encounter (Signed)
Last RF 07/02/20 # 90. Sent pt message via MyChart to call and make appointment. Requested Prescriptions  Pending Prescriptions Disp Refills   metoprolol tartrate (LOPRESSOR) 25 MG tablet [Pharmacy Med Name: METOPROLOL TARTRATE '25MG'$  TABLETS] 90 tablet 0    Sig: TAKE 1/2 TABLET(12.5 MG) BY MOUTH TWICE DAILY      Cardiovascular:  Beta Blockers Failed - 09/29/2020  7:15 AM      Failed - Last BP in normal range    BP Readings from Last 1 Encounters:  07/18/20 (!) 152/78          Passed - Last Heart Rate in normal range    Pulse Readings from Last 1 Encounters:  07/18/20 (!) 57          Passed - Valid encounter within last 6 months    Recent Outpatient Visits           2 months ago BPPV (benign paroxysmal positional vertigo), bilateral   [place], DO   4 months ago Acute bronchitis, unspecified organism   [place], DO   6 months ago ED (erectile dysfunction) of organic origin   [place], DO   8 months ago Seborrheic dermatitis of scalp   [place], DO   10 months ago Annual physical exam   [place], DO

## 2020-10-08 ENCOUNTER — Telehealth: Payer: Self-pay | Admitting: Family Medicine

## 2020-10-08 NOTE — Telephone Encounter (Signed)
Left message for patient to call back and schedule the Medicare Annual Wellness Visit (AWV) virtually or by telephone.  Last AWV 05/24/2019  Please schedule at anytime with [facility].  40 minute appointment  Any questions, please call me at [phone]

## 2020-10-24 ENCOUNTER — Other Ambulatory Visit: Payer: Self-pay | Admitting: Internal Medicine

## 2020-10-24 ENCOUNTER — Other Ambulatory Visit: Payer: Self-pay | Admitting: Family Medicine

## 2020-10-24 DIAGNOSIS — I10 Essential (primary) hypertension: Secondary | ICD-10-CM

## 2020-10-24 NOTE — Telephone Encounter (Signed)
Requested medication (s) are due for refill today:   Yes for both  Requested medication (s) are on the active medication list:   Yes for both  Future visit scheduled:   No  LOV 07/18/2020   Last ordered: Lotensin HCT 11/04/2019 #90, 3;    omeprazole 02/07/2020 #90, 2 refills  Returned because protocol failed due to lab work being due.   Requested Prescriptions  Pending Prescriptions Disp Refills   benazepril-hydrochlorthiazide (LOTENSIN HCT) 10-12.5 MG tablet [Pharmacy Med Name: BENAZEPRIL/HCTZ 10/12.'5MG'$  TABLETS] 90 tablet 3    Sig: TAKE 1 TABLET BY MOUTH DAILY     Cardiovascular:  ACEI + Diuretic Combos Failed - 10/24/2020  7:16 AM      Failed - Na in normal range and within 180 days    Sodium  Date Value Ref Range Status  11/09/2019 140 135 - 146 mmol/L Final  06/19/2015 142 134 - 144 mmol/L Final  12/07/2012 139 136 - 145 mmol/L Final          Failed - K in normal range and within 180 days    Potassium  Date Value Ref Range Status  11/09/2019 3.4 (L) 3.5 - 5.3 mmol/L Final  12/07/2012 3.3 (L) 3.5 - 5.1 mmol/L Final          Failed - Cr in normal range and within 180 days    Creat  Date Value Ref Range Status  11/09/2019 1.06 0.70 - 1.25 mg/dL Final    Comment:    For patients >35 years of age, the reference limit for Creatinine is approximately 13% higher for people identified as African-American. .    Creatinine, POC  Date Value Ref Range Status  06/18/2015 0 mg/dL Final          Failed - Ca in normal range and within 180 days    Calcium  Date Value Ref Range Status  11/09/2019 9.0 8.6 - 10.3 mg/dL Final   Calcium, Total  Date Value Ref Range Status  12/07/2012 8.9 8.5 - 10.1 mg/dL Final          Failed - Last BP in normal range    BP Readings from Last 1 Encounters:  07/18/20 (!) 152/78          Passed - Patient is not pregnant      Passed - Valid encounter within last 6 months    Recent Outpatient Visits           3 months ago BPPV (benign  paroxysmal positional vertigo), bilateral   [facility] [place], Devonne Doughty, DO   5 months ago Acute bronchitis, unspecified organism   [facility], Devonne Doughty, DO   7 months ago ED (erectile dysfunction) of organic origin   [facility] [place], Devonne Doughty, DO   9 months ago Seborrheic dermatitis of scalp   [facility] Olin Hauser, DO   11 months ago Annual physical exam   [place], DO               omeprazole (PRILOSEC) 40 MG capsule [Pharmacy Med Name: OMEPRAZOLE '40MG'$  CAPSULES] 90 capsule 2    Sig: TAKE 1 CAPSULE BY MOUTH EVERY DAY BEFORE BREAKFAST 15 TO 9 MINUTES BEFORE FIRST MEAL OF THE DAY     Gastroenterology: Proton Pump Inhibitors Passed - 10/24/2020  7:16 AM      Passed - Valid encounter within last 12 months  Recent Outpatient Visits           3 months ago BPPV (benign paroxysmal positional vertigo), bilateral   [place], DO   5 months ago Acute bronchitis, unspecified organism   [place], DO   7 months ago ED (erectile dysfunction) of organic origin   [place] J, DO   9 months ago Seborrheic dermatitis of scalp   [place], DO   11 months ago Annual physical exam   [place], Devonne Doughty, DO

## 2020-10-29 DIAGNOSIS — E119 Type 2 diabetes mellitus without complications: Secondary | ICD-10-CM | POA: Diagnosis not present

## 2020-11-11 ENCOUNTER — Emergency Department
Admission: EM | Admit: 2020-11-11 | Discharge: 2020-11-11 | Disposition: A | Discharge status: Home / Self Care | Payer: Medicare Other | Attending: Emergency Medicine | Admitting: Emergency Medicine

## 2020-11-11 ENCOUNTER — Emergency Department: Payer: Medicare Other

## 2020-11-11 ENCOUNTER — Other Ambulatory Visit: Payer: Self-pay

## 2020-11-11 DIAGNOSIS — Z7982 Long term (current) use of aspirin: Secondary | ICD-10-CM | POA: Diagnosis not present

## 2020-11-11 DIAGNOSIS — N189 Chronic kidney disease, unspecified: Secondary | ICD-10-CM | POA: Insufficient documentation

## 2020-11-11 DIAGNOSIS — Z96659 Presence of unspecified artificial knee joint: Secondary | ICD-10-CM | POA: Insufficient documentation

## 2020-11-11 DIAGNOSIS — E039 Hypothyroidism, unspecified: Secondary | ICD-10-CM | POA: Insufficient documentation

## 2020-11-11 DIAGNOSIS — Z79899 Other long term (current) drug therapy: Secondary | ICD-10-CM | POA: Insufficient documentation

## 2020-11-11 DIAGNOSIS — E1159 Type 2 diabetes mellitus with other circulatory complications: Secondary | ICD-10-CM | POA: Diagnosis not present

## 2020-11-11 DIAGNOSIS — Z87891 Personal history of nicotine dependence: Secondary | ICD-10-CM | POA: Insufficient documentation

## 2020-11-11 DIAGNOSIS — Z20822 Contact with and (suspected) exposure to covid-19: Secondary | ICD-10-CM | POA: Insufficient documentation

## 2020-11-11 DIAGNOSIS — R0789 Other chest pain: Secondary | ICD-10-CM | POA: Insufficient documentation

## 2020-11-11 DIAGNOSIS — R0602 Shortness of breath: Secondary | ICD-10-CM | POA: Diagnosis not present

## 2020-11-11 DIAGNOSIS — N400 Enlarged prostate without lower urinary tract symptoms: Secondary | ICD-10-CM | POA: Insufficient documentation

## 2020-11-11 DIAGNOSIS — Z7984 Long term (current) use of oral hypoglycemic drugs: Secondary | ICD-10-CM | POA: Diagnosis not present

## 2020-11-11 DIAGNOSIS — R079 Chest pain, unspecified: Secondary | ICD-10-CM

## 2020-11-11 DIAGNOSIS — E1136 Type 2 diabetes mellitus with diabetic cataract: Secondary | ICD-10-CM | POA: Insufficient documentation

## 2020-11-11 LAB — CBC
HCT: 45.5 % (ref 39.0–52.0)
Hemoglobin: 15 g/dL (ref 13.0–17.0)
MCH: 30.2 pg (ref 26.0–34.0)
MCHC: 33 g/dL (ref 30.0–36.0)
MCV: 91.5 fL (ref 80.0–100.0)
Platelets: 180 10*3/uL (ref 150–400)
RBC: 4.97 MIL/uL (ref 4.22–5.81)
RDW: 12.4 % (ref 11.5–15.5)
WBC: 5.6 10*3/uL (ref 4.0–10.5)
nRBC: 0 % (ref 0.0–0.2)

## 2020-11-11 LAB — BASIC METABOLIC PANEL
Anion gap: 9 (ref 5–15)
BUN: 15 mg/dL (ref 8–23)
CO2: 29 mmol/L (ref 22–32)
Calcium: 9 mg/dL (ref 8.9–10.3)
Chloride: 102 mmol/L (ref 98–111)
Creatinine, Ser: 0.87 mg/dL (ref 0.61–1.24)
GFR, Estimated: >60 mL/min (ref >60)
Glucose, Bld: 136 mg/dL — ABNORMAL HIGH (ref 70–99)
Potassium: 4.7 mmol/L (ref 3.5–5.1)
Sodium: 140 mmol/L (ref 135–145)

## 2020-11-11 LAB — TROPONIN I (HIGH SENSITIVITY)
Troponin I (High Sensitivity): 5 ng/L (ref <18)
Troponin I (High Sensitivity): 5 ng/L (ref <18)

## 2020-11-11 LAB — RESP PANEL BY RT-PCR (FLU A&B, COVID) ARPGX2
Influenza A by PCR: NEGATIVE
Influenza B by PCR: NEGATIVE
SARS Coronavirus 2 by RT PCR: NEGATIVE

## 2020-11-11 NOTE — ED Notes (Signed)
Pt to ED c/o sharp, stabbing CP on L side and L back. No radiation to arm or jaw. Worse with inspiration. Denies cough.  States pain started about 2 weeks ago and worsened this morning.  Denies NVD. Denies diaphoresis. States from time feels slight dizziness.

## 2020-11-11 NOTE — ED Notes (Signed)
Patient transported to X-ray 

## 2020-11-11 NOTE — ED Triage Notes (Signed)
Pt comes with c/o left sided CP that started two days ago. Pt states some SOb. Pt states the pain has gotten worse.

## 2020-11-11 NOTE — Discharge Instructions (Addendum)
Please call the number provided for cardiology to arrange a follow-up appointment for recheck/reevaluation.  Return to the emergency department for any return of/worsening chest pain, trouble breathing, or any other symptom personally concerning to yourself.

## 2020-11-11 NOTE — ED Notes (Signed)
Pt resting in bed, esplained to pt and daughter that will draw second troponin 2hr after first, then possibly discharge. No needs expressed at this time.

## 2020-11-11 NOTE — ED Provider Notes (Signed)
[facility] Emergency Department Provider Note  Time seen: 10:28 AM  I have reviewed the triage vital signs and the nursing notes.   HISTORY  Chief Complaint Chest Pain   HPI Ross Riley is a 70 y.o. male with a past medical history of arthritis, BPH, CKD, gastric reflux, hypertension, hyperlipidemia, presents to the emergency department for chest pain.  According to the patient over the past 2 days he has had chest discomfort and mild shortness of breath.  States the pain worsened this morning with left-sided sharp chest pain, moderate in severity.  States mild discomfort currently.  Denies any cough.  No abdominal pain.  No nausea or vomiting or diaphoresis.  No history of heart attacks previously.   Past Medical History:  Diagnosis Date   Arthritis    BPH (benign prostatic hyperplasia)    Chronic kidney disease    kidney stones   Dystonia    ED (erectile dysfunction)    GERD (gastroesophageal reflux disease)    Glaucoma (increased eye pressure)    Hematuria    Hemorrhoids    Hyperlipidemia    Hypertension    Thyroid disease     Patient Active Problem List   Diagnosis Date Noted   Epidermal inclusion cyst 06/09/2019   Erectile dysfunction due to arterial insufficiency 09/14/2018   Abnormal ejaculation 09/14/2018   Former smoker 07/08/2017   Hypothyroidism 07/08/2017   Hx of adenomatous colonic polyps 06/09/2017   History of diverticulitis 06/09/2017   History of Clostridium difficile colitis 06/09/2017   Recurrent colitis due to Clostridium difficile 03/25/2017   Chills (without fever) 03/25/2017   Diverticulitis 12/04/2016   Anemia 12/04/2016   Chronic right shoulder pain 11/24/2016   Other constipation 11/24/2016   BPPV (benign paroxysmal positional vertigo), bilateral 10/09/2016   Knee stiffness 10/03/2016   S/P TKR (total knee replacement), left 09/22/2016   Osteoarthritis of left knee 06/05/2016   Atypical intracranial meningioma  ([place]) 05/22/2016   Abdominal pain, LLQ (left lower quadrant) 02/11/2016   Seasonal allergies 01/24/2015   BPH (benign prostatic hyperplasia) 12/06/2014   Pain in joint, ankle and foot 10/26/2014   Gonalgia 10/26/2014   Osteoarthritis of multiple joints 10/26/2014   Bilateral cataracts 10/26/2014   ED (erectile dysfunction) of organic origin 10/26/2014   Acid reflux 10/26/2014   Hyperlipidemia associated with type 2 diabetes mellitus ([place]) 10/26/2014   Essential hypertension 10/26/2014   Type 2 diabetes mellitus with diabetic cataract, without long-term current use of insulin ([place]) 08/02/2014   Focal dystonia 07/22/2012    Past Surgical History:  Procedure Laterality Date   CATARACT EXTRACTION Bilateral    EXTRACORPOREAL SHOCK WAVE LITHOTRIPSY Right    JOINT REPLACEMENT Left 08/2016   left knee, [place]   KIDNEY STONE SURGERY  2009   PROSTATE SURGERY  2011   TRANSURETHRAL RESECTION OF PROSTATE N/A 12/06/2014   Procedure: TRANSURETHRAL RESECTION OF THE PROSTATE (TURP);  Surgeon: Nickie Retort, MD;  Location: [facility] ORS;  Service: Urology;  Laterality: N/A;    Prior to Admission medications   Medication Sig Start Date End Date Taking? Authorizing Provider  ACCU-CHEK FASTCLIX LANCETS MISC check blood sugar up to 1 time daily as directed 07/24/17   Parks Ranger, Devonne Doughty, DO  albuterol (VENTOLIN HFA) 108 (90 Base) MCG/ACT inhaler Inhale 2 puffs into the lungs every 4 (four) hours as needed for shortness of breath (cough). 05/22/20   Karamalegos, Devonne Doughty, DO  Alcohol Swabs (B-D SINGLE USE SWABS REGULAR) PADS check blood  sugar up to 1 time daily as directed 07/24/17   Olin Hauser, DO  aspirin EC 81 MG tablet Take by mouth.    [provider]  atorvastatin (LIPITOR) 20 MG tablet Take 1 tablet (20 mg total) by mouth daily. 11/15/19   Karamalegos, Devonne Doughty, DO  benazepril-hydrochlorthiazide (LOTENSIN HCT) 10-12.5 MG tablet TAKE 1 TABLET BY MOUTH DAILY 10/24/20    Jearld Fenton, NP  betamethasone valerate (VALISONE) 0.1 % cream Apply topically 2 (two) times daily. For 2-4 weeks, apply to areas of scalp. 01/20/20   Parks Ranger, Devonne Doughty, DO  Blood Glucose Monitoring Suppl (ACCU-CHEK AVIVA PLUS) w/Device KIT Use device to check blood sugar up to 1 time daily as directed 07/24/17   Olin Hauser, DO  Cholecalciferol (VITAMIN D3) 125 MCG (5000 UT) CAPS Take by mouth.    [provider]  Ciclopirox 1 % shampoo Apply 5 mLs topically 2 (two) times a week. Leave shampoo on scalp for 5 minutes, then rinse off. Use for total of 4 weeks. May repeat if problem returns. 01/19/20   Karamalegos, Devonne Doughty, DO  dorzolamide-timolol (COSOPT) 22.3-6.8 MG/ML ophthalmic solution dorzolamide 22.3 mg-timolol 6.8 mg/mL eye drops    [provider]  glipiZIDE (GLUCOTROL XL) 5 MG 24 hr tablet TAKE 1 TABLET(5 MG) BY MOUTH DAILY WITH BREAKFAST 07/26/20   Karamalegos, Devonne Doughty, DO  glucose blood test strip check blood sugar up to 1 time daily as directed 07/24/17   Parks Ranger, Devonne Doughty, DO  latanoprost (XALATAN) 0.005 % ophthalmic solution INT 1 GTT INTO OU QHS 10/20/17   [provider]  levothyroxine (SYNTHROID) 75 MCG tablet Take 50 mcg by mouth.  09/13/18   [provider]  meclizine (ANTIVERT) 25 MG tablet Take 1 tablet (25 mg total) by mouth 3 (three) times daily as needed for dizziness. 07/18/20   Karamalegos, Devonne Doughty, DO  metoprolol tartrate (LOPRESSOR) 25 MG tablet TAKE 1/2 TABLET(12.5 MG) BY MOUTH TWICE DAILY 10/01/20   Parks Ranger, Devonne Doughty, DO  naproxen (NAPROSYN) 500 MG tablet Take 1 tablet (500 mg total) by mouth 2 (two) times daily with a meal. For 2-4 weeks then as needed 05/10/19   Olin Hauser, DO  nortriptyline (PAMELOR) 10 MG capsule take 30 mg nightly for one week, then increase to 40 mg nightly 03/16/19   [provider]  omeprazole (PRILOSEC) 40 MG capsule TAKE 1 CAPSULE BY MOUTH EVERY DAY  BEFORE BREAKFAST 15 TO 30 MINUTES BEFORE FIRST MEAL OF THE DAY 10/24/20   Jearld Fenton, NP  ondansetron (ZOFRAN ODT) 4 MG disintegrating tablet Take 1 tablet (4 mg total) by mouth every 8 (eight) hours as needed for nausea or vomiting. 07/18/20   Parks Ranger, Devonne Doughty, DO  sildenafil (VIAGRA) 100 MG tablet Take 1 tablet (100 mg total) by mouth daily as needed for erectile dysfunction. Take two hours prior to intercourse on an empty stomach 03/16/20   Olin Hauser, DO    Allergies  Allergen Reactions   Metformin And Related Other (See Comments)    Headache and dizzines   Metformin Hcl Other (See Comments)    headache and dizziness    Family History  Problem Relation Age of Onset   Heart disease Mother     Social History Social History   Tobacco Use   Smoking status: Former    Packs/day: 1.00    Years: 32.00    Pack years: 32.00    Types: Cigarettes    Quit  date: 03/04/1991    Years since quitting: 29.7   Smokeless tobacco: Former  Scientific laboratory technician Use: Never used  Substance Use Topics   Alcohol use: Yes    Alcohol/week: 0.0 standard drinks    Comment: occasional   Drug use: No    Review of Systems Constitutional: Negative for fever. Cardiovascular: 2 days of left-sided chest pain Respiratory: Mild shortness of breath Gastrointestinal: Negative for abdominal pain Musculoskeletal: Negative for musculoskeletal complaints Neurological: Negative for headache All other ROS negative  ____________________________________________   PHYSICAL EXAM:  VITAL SIGNS: ED Triage Vitals  Enc Vitals Group     BP 11/11/20 1017 (!) 173/89     Pulse Rate 11/11/20 1017 (!) 56     Resp 11/11/20 1017 20     Temp 11/11/20 1017 97.9 F (36.6 C)     Temp Source 11/11/20 1017 Oral     SpO2 11/11/20 1017 98 %     Weight --      Height --      Head Circumference --      Peak Flow --      Pain Score 11/11/20 1014 5     Pain Loc --      Pain Edu? --      Excl. in  [place]? --     Constitutional: Alert and oriented. Well appearing and in no distress. Eyes: Normal exam ENT      Head: Normocephalic and atraumatic.      Mouth/Throat: Mucous membranes are moist. Cardiovascular: Normal rate, regular rhythm. Respiratory: Normal respiratory effort without tachypnea nor retractions. Breath sounds are clear  Gastrointestinal: Soft and nontender. No distention.   Musculoskeletal: Nontender with normal range of motion in all extremities.  Neurologic:  Normal speech and language. No gross focal neurologic deficits Skin:  Skin is warm, dry and intact.  Psychiatric: Mood and affect are normal.   ____________________________________________    EKG  EKG viewed and interpreted by myself shows sinus bradycardia at 58 bpm with a narrow QRS, left axis deviation, largely normal intervals with nonspecific ST changes.  ____________________________________________    RADIOLOGY  Chest x-ray is negative  ____________________________________________   INITIAL IMPRESSION / ASSESSMENT AND PLAN / ED COURSE  Pertinent labs & imaging results that were available during my care of the patient were reviewed by me and considered in my medical decision making (see chart for details).   Patient presents emergency department for left-sided chest pain for the past 2 days but worse since this morning.  Overall patient appears well, no distress moderately hypertensive otherwise reassuring vitals.  Differential is quite broad but would include ACS, pneumonia, COVID, chest wall discomfort.  We will check labs including cardiac enzymes, EKG, chest x-ray and continue to closely monitor.  We will also obtain a COVID swab as a precaution.  Patient agreeable to plan of care.  Patient's work-up is reassuring.  X-ray is negative.  Troponin negative x2.  Patient will be discharged with cardiology follow-up.  Discussed plan of care with interpreter present.  Patient agreeable to plan.  Ross Riley was evaluated in Emergency Department on 11/11/2020 for the symptoms described in the history of present illness. He was evaluated in the context of the global COVID-19 pandemic, which necessitated consideration that the patient might be at risk for infection with the SARS-CoV-2 virus that causes COVID-19. Institutional protocols and algorithms that pertain to the evaluation of patients at risk for COVID-19 are in a state of rapid  change based on information released by regulatory bodies including the CDC and federal and state organizations. These policies and algorithms were followed during the patient's care in the ED.  ____________________________________________   FINAL CLINICAL IMPRESSION(S) / ED DIAGNOSES  Chest pain   Harvest Dark, MD 11/11/20 1324

## 2020-11-11 NOTE — ED Notes (Signed)
Pt will be discharged once provider speaks with pt at bedside with interpreter.

## 2020-12-04 ENCOUNTER — Telehealth: Payer: Self-pay | Admitting: Family Medicine

## 2020-12-04 NOTE — Telephone Encounter (Signed)
Pt needs an RX for diabetic shoes sent to [facility] clinic / fax# [phone]

## 2020-12-04 NOTE — Telephone Encounter (Signed)
Please notify patient that since it has been >6 months since last Diabetic Foot exam, he will need to be seen first before we can order Diabetic Shoes. Medicare requires documentation to be updated before we order.  His apt is on 12/12/20 already for back pain. We can do a Diabetic Foot Exam on that date and then fax the order.  [name], DO [facility] 12/04/2020, 4:41 PM

## 2020-12-05 NOTE — Telephone Encounter (Signed)
I spoke with Ross Riley and she understands the protocol we have to follow. She is fine with waiting to get the prescription after his exam next week.

## 2020-12-06 DIAGNOSIS — I10 Essential (primary) hypertension: Secondary | ICD-10-CM | POA: Diagnosis not present

## 2020-12-06 DIAGNOSIS — I739 Peripheral vascular disease, unspecified: Secondary | ICD-10-CM | POA: Diagnosis not present

## 2020-12-06 DIAGNOSIS — N182 Chronic kidney disease, stage 2 (mild): Secondary | ICD-10-CM | POA: Diagnosis not present

## 2020-12-06 DIAGNOSIS — I509 Heart failure, unspecified: Secondary | ICD-10-CM | POA: Diagnosis not present

## 2020-12-06 DIAGNOSIS — K219 Gastro-esophageal reflux disease without esophagitis: Secondary | ICD-10-CM | POA: Diagnosis not present

## 2020-12-06 DIAGNOSIS — I209 Angina pectoris, unspecified: Secondary | ICD-10-CM | POA: Diagnosis not present

## 2020-12-06 DIAGNOSIS — I208 Other forms of angina pectoris: Secondary | ICD-10-CM | POA: Diagnosis not present

## 2020-12-06 DIAGNOSIS — E119 Type 2 diabetes mellitus without complications: Secondary | ICD-10-CM | POA: Diagnosis not present

## 2020-12-06 DIAGNOSIS — R600 Localized edema: Secondary | ICD-10-CM | POA: Diagnosis not present

## 2020-12-12 ENCOUNTER — Encounter: Payer: Self-pay | Admitting: Family Medicine

## 2020-12-12 ENCOUNTER — Ambulatory Visit (INDEPENDENT_AMBULATORY_CARE_PROVIDER_SITE_OTHER): Payer: Medicare Other | Admitting: Family Medicine

## 2020-12-12 ENCOUNTER — Other Ambulatory Visit: Payer: Self-pay

## 2020-12-12 VITALS — BP 138/79 | HR 61 | Ht 66.0 in | Wt 169.0 lb

## 2020-12-12 DIAGNOSIS — G248 Other dystonia: Secondary | ICD-10-CM

## 2020-12-12 DIAGNOSIS — Z23 Encounter for immunization: Secondary | ICD-10-CM

## 2020-12-12 DIAGNOSIS — E1136 Type 2 diabetes mellitus with diabetic cataract: Secondary | ICD-10-CM | POA: Diagnosis not present

## 2020-12-12 DIAGNOSIS — M7062 Trochanteric bursitis, left hip: Secondary | ICD-10-CM | POA: Diagnosis not present

## 2020-12-12 MED ORDER — PREDNISONE 20 MG PO TABS: ORAL_TABLET | ORAL | 0 refills | Status: DC

## 2020-12-12 NOTE — Progress Notes (Signed)
Subjective:    Patient ID: Ross Riley, male    DOB: May 03, 1950, 70 y.o.   MRN: [id number]  Ross Riley is a 70 y.o. male presenting on 12/12/2020 for Diabetes   HPI  Left Hip Pain Trochanteric bursitis Worse onset over past 3 weeks with pain with movement and laying on L Hip  Diabetes, Type 2 No concerns today. A1c last check stable at 6.4. Denies hypoglycemia. Checking CBG regularly. Meds: Glipizide XL 5mg  daily Reports good compliance. Tolerating well w/o side-effects Currently on ACEi, ASA, Statin Lifestyle: - Diet (balanced diet) - Exercise (limited activity due to chronic LLE issues see note) He will return to [facility] Needs order. Then he will get the L shoe lateral wedge added after  Health Maintenance: Flu Shot today  Depression screen [facility] 2/9 11/15/2019 05/24/2019 11/05/2018  Decreased Interest 0 0 0  Down, Depressed, Hopeless 0 0 0  PHQ - 2 Score 0 0 0  Altered sleeping - - -  Tired, decreased energy - - -  Change in appetite - - -  Feeling bad or failure about yourself  - - -  Trouble concentrating - - -  Moving slowly or fidgety/restless - - -  Suicidal thoughts - - -  PHQ-9 Score - - -  Difficult doing work/chores - - -    Social History   Tobacco Use   Smoking status: Former    Packs/day: 1.00    Years: 32.00    Pack years: 32.00    Types: Cigarettes    Quit date: 03/04/1991    Years since quitting: 29.7   Smokeless tobacco: Former  Scientific laboratory technician Use: Never used  Substance Use Topics   Alcohol use: Yes    Alcohol/week: 0.0 standard drinks    Comment: occasional   Drug use: No    Review of Systems Per HPI unless specifically indicated above     Objective:    BP 138/79   Pulse 61   Ht 5\' 6"  (1.676 m)   Wt 169 lb (76.7 kg)   SpO2 100%   BMI 27.28 kg/m   Wt Readings from Last 3 Encounters:  12/12/20 169 lb (76.7 kg)  07/18/20 172 lb 12.8 oz (78.4 kg)  05/22/20 172 lb (78 kg)     Physical Exam Vitals and nursing note reviewed.  Constitutional:      General: He is not in acute distress.    Appearance: Normal appearance. He is well-developed. He is not diaphoretic.     Comments: Well-appearing, comfortable, cooperative  HENT:     Head: Normocephalic and atraumatic.  Eyes:     General:        Right eye: No discharge.        Left eye: No discharge.     Conjunctiva/sclera: Conjunctivae normal.  Cardiovascular:     Rate and Rhythm: Normal rate.  Pulmonary:     Effort: Pulmonary effort is normal.  Musculoskeletal:     Comments: Left hip pain over greater trochanter  Skin:    General: Skin is warm and dry.     Findings: No erythema or rash.  Neurological:     Mental Status: He is alert and oriented to person, place, and time.  Psychiatric:        Mood and Affect: Mood normal.        Behavior: Behavior normal.        Thought Content: Thought content  normal.     Comments: Well groomed, good eye contact, normal speech and thoughts    Diabetic Foot Exam - Simple   Simple Foot Form Diabetic Foot exam was performed with the following findings: Yes 12/12/2020  1:45 PM  Visual Inspection See comments: Yes Sensation Testing Intact to touch and monofilament testing bilaterally: Yes Pulse Check Posterior Tibialis and Dorsalis pulse intact bilaterally: Yes Comments Left foot has spastic dystonia deformity. No ulceration. He has callus formation heels and forefoot. Intact sensation to monofilament.      Results for orders placed or performed in visit on 12/12/20  Hemoglobin A1c  Result Value Ref Range   Hemoglobin A1C 6.8       Assessment & Plan:   Problem List Items Addressed This Visit     Type 2 diabetes mellitus with diabetic cataract, without long-term current use of insulin ([place]) - Primary    Followed by [facility] Endocrinology Controlled A1c 6.8 previous result. No known hypoglycemia. Complications - DM cataracts removed, other including  hyperlipidemia, GERD, hypothyroidism - increases risk of future cardiovascular complications   Plan:  1. Continue current therapy - Glipizide XL 5mg  daily - caution hypoglycemia 2. Encourage improved lifestyle - low carb, low sugar diet, reduce portion size, continue improving regular exercise 3. Check CBG , bring log to next visit for review 4. Continue ASA, Statin DM Foot exam today see below       Focal dystonia    Stable, chronic Left foot deformity - chronic problem. It is significantly affecting his gait and limiting his ambulation which appears to have affected degenerative wear on his Left knee, now s/p TKR L knee - Followed by Neurology / Ortho - he has diabetic shoes and orthotic inserts / AFO / L shoe lateral wedge  Will order new DM shoes       Other Visit Diagnoses     Trochanteric bursitis of left hip       Left hip pain with bursitis - will treat with Prednisone taper today, caution hyperglycemia   Relevant Medications   predniSONE (DELTASONE) 20 MG tablet   Needs flu shot       Relevant Orders   Flu Vaccine QUAD High Dose(Fluad) (Completed)       Diabetic Shoes Chronic diabetes without neuropathy but with callus formation bilateral feet and Left foot deformity with focal dystonia causing disfiguration of his L foot and loss of muscle tone Evidence of callus formation both feet, heels and mid foot  Completed DM Foot Exam today in office, 12/12/20. See exam note.  Plan - Proceed with ordering Diabetic Shoes, completed form today, will fax back to [facility] along with this office note - Patient would benefit from Diabetic Shoes due to callus formation and Left foot deformity.  He would also benefit from L shoe lateral shoe wedge if able, he may purchase after shoes completed.  Diabetes control is improving on current regimen, and I am continuing to monitor and manage diabetes.    Meds ordered this encounter  Medications   predniSONE (DELTASONE) 20  MG tablet    Sig: Take daily with food. Start with 60mg  (3 pills) x 2 days, then reduce to 40mg  (2 pills) x 2 days, then 20mg  (1 pill) x 3 days    Dispense:  13 tablet    Refill:  0     Follow up plan: Return in about 4 months (around 04/14/2021) for 4 month Annual Physical AFTERNOON fasting lab only after.  [name], DO [facility] 12/12/2020, 1:41 PM

## 2020-12-12 NOTE — Assessment & Plan Note (Signed)
Stable, chronic Left foot deformity - chronic problem. It is significantly affecting his gait and limiting his ambulation which appears to have affected degenerative wear on his Left knee, now s/p TKR L knee - Followed by Neurology / Ortho - he has diabetic shoes and orthotic inserts / AFO / L shoe lateral wedge  Will order new DM shoes

## 2020-12-12 NOTE — Assessment & Plan Note (Signed)
Followed by [facility] Endocrinology Controlled A1c 6.8 previous result. No known hypoglycemia. Complications - DM cataracts removed, other including hyperlipidemia, GERD, hypothyroidism - increases risk of future cardiovascular complications   Plan:  1. Continue current therapy - Glipizide XL 5mg  daily - caution hypoglycemia 2. Encourage improved lifestyle - low carb, low sugar diet, reduce portion size, continue improving regular exercise 3. Check CBG , bring log to next visit for review 4. Continue ASA, Statin DM Foot exam today see below

## 2020-12-12 NOTE — Patient Instructions (Addendum)
Thank you for coming to the office today.  Start steroid pill for 7 days, sugar may increase while on med. This is temporary  We will fax order for shoes this week  DUE for FASTING BLOOD WORK (no food or drink after midnight before the lab appointment, only water or coffee without cream/sugar on the morning of)  SCHEDULE "Lab Only" visit in the morning at the clinic for lab draw in 4 MONTHS    Bursitis en la cadera Hip Bursitis La bursitis en la cadera es la hinchazn de uno o ms sacos llenos de lquido (bolsas sinoviales) en la articulacin de la cadera. Esta afeccin puede Engineer, drilling, y los sntomas pueden aparecer y Armed forces operational officer con Physiological scientist. Cules son las causas? El uso repetido de los msculos de la cadera. Lesin en la cadera. Debilidad en los glteos. Espolones seos. Infeccin. En algunos casos, es posible que la causa se desconozca. Qu incrementa el riesgo? Es ms probable que sufra esta afeccin si: Ha tenido una lesin en la cadera o una ciruga de cadera. Tiene una afeccin, como artritis, gota, diabetes o enfermedad de la tiroides. Tiene problemas en la columna. Tiene una pierna ms corta que la [place]. Corre mucho o hace carreras de larga distancia. Practica deportes en los que hay riesgo de lesiones o cadas, como ftbol americano, artes marciales o esqu. Cules son los signos o sntomas? Los sntomas pueden aparecer y Armed forces operational officer y, con frecuencia, suelen incluir los siguientes: Dolor en la zona de la cadera o la ingle. El dolor puede empeorar al mover la cadera. Sensibilidad e hinchazn en la cadera. En casos poco frecuentes, la bolsa sinovial puede infectarse. Si esto sucede, puede tener fiebre, as como calor y enrojecimiento en la zona de la cadera. Cmo se trata? Esta afeccin se trata de la siguiente manera: Poner la cadera en reposo. Aplicar hielo en la cadera. Envolver la zona de la cadera con una venda elstica (vendaje de compresin). Mantener  la [employer]. Otros tratamientos pueden incluir tomar medicamentos, drenar el lquido de la bolsa sinovial, o usar [place], un bastn o un andador. En algunos casos poco frecuentes es necesaria la [place]. El tratamiento a largo plazo puede incluir hacer ejercicios para ayudar a la fuerza y la flexibilidad. Tambin puede incluir cambios en el estilo de vida, como bajar de peso para disminuir el esfuerzo de la cadera. Siga estas instrucciones en su casa: Control del dolor, la rigidez y la hinchazn   Si se lo indican, aplique hielo sobre la zona dolorida. Ponga el hielo en una bolsa plstica. Coloque una toalla entre la piel y Therapist, nutritional. Aplique el hielo durante 20 minutos, 2 o 3 veces por da. Eleve la cadera; para ello, pngase una almohada debajo de la cadera [place] se encuentra acostado. Detngase si siente dolor. Si se lo indican, aplique calor en la zona afectada. Hgalo con la frecuencia que le haya indicado el mdico. Use una compresa de calor hmedo o una almohadilla trmica como se lo haya indicado el mdico. Coloque una toalla entre la piel y la fuente de Freight forwarder. Aplique calor durante 20 a 30 minutos. Retire la fuente de calor si la piel se le pone de color rojo brillante. Esto es muy importante si no puede Education officer, environmental, calor o fro. Puede correr un riesgo mayor de sufrir quemaduras. Actividad No apoye el peso del cuerpo [employer] cadera, hasta tanto el mdico lo autorice. Use muletas, un bastn o un andador, segn se lo haya indicado  el mdico. Si la pierna afectada es la que [place] para conducir, pregntele al mdico si es seguro que conduzca. Haga reposo y Clinical research associate la cadera tanto como pueda hasta que se sienta mejor. Retome sus actividades normales segn lo indicado por el mdico. Pregntele al mdico qu actividades son seguras para usted. Haga los ejercicios como se lo haya indicado el mdico. Instrucciones generales Use los medicamentos de venta libre y los recetados  solamente como se lo haya indicado el mdico. [place] y estire suavemente la zona de la lesin tan frecuentemente como le resulte cmodo. Use vendas elsticas nicamente segn las indicaciones del mdico. Si tiene una pierna ms corta que la otra, hgase tomar las medidas para una plantilla o un aparato ortopdico. Mantenga un peso saludable. Siga las instrucciones del mdico. Concurra a todas visitas de seguimiento como se lo haya indicado el mdico. Esto es importante. Cmo se evita? Haga ejercicios con regularidad como se lo haya indicado el mdico. Use el calzado adecuado para Information systems manager. Precaliente y elongue adecuadamente antes de hacer actividad fsica. Reljese y elongue despus de hacer actividad fsica. Tome descansos con frecuencia al realizar actividades repetitivas. Evite las [employer] le causan molestias o dolor en la cadera. Evite estar sentado [employer]. Dnde buscar ms informacin American Academy of Orthopaedic Surgeons (Academia Estadounidense de Cirujanos Ortopdicos): orthoinfo.aaos.org Comunquese con un mdico si: Tiene fiebre. Aparecen nuevos sntomas. Tiene dificultad para caminar o [employer] cotidianas. Tiene un dolor que empeora o que no mejora con los medicamentos. La piel alrededor de la cadera est enrojecida. Tiene sensacin de calor en la zona de la cadera. Solicite ayuda de inmediato si: No puede mover la cadera. Siente Geophysical data processor. No puede controlar los [employer]. Resumen La bursitis en la cadera es la hinchazn de uno o ms sacos llenos de lquido (bolsas sinoviales) en la articulacin de la cadera. Los sntomas suelen aparecer y Armed forces operational officer con Physiological scientist. Esta afeccin suele tratarse con reposo y aplicacin de hielo en la cadera. Tambin puede ayudar el mantener la zona elevada y envuelta con una venda elstica. Es posible que se necesiten otros tratamientos. Esta informacin no tiene Hydrologist el consejo del mdico. Asegrese de hacerle al mdico cualquier pregunta que tenga. Document Revised: 03/02/2019 Document Reviewed: 11/12/2017 Elsevier Patient Education  2022 [place].   Please schedule a Follow-up Appointment to: Return in about 4 months (around 04/14/2021) for 4 month Annual Physical AFTERNOON fasting lab only after.  If you have any other questions or concerns, please feel free to call the office or send a message through [place]. You may also schedule an earlier appointment if necessary.  Additionally, you may be receiving a survey about your experience at our office within a few days to 1 week by e-mail or mail. We value your feedback.  [name], DO [place]

## 2020-12-16 ENCOUNTER — Other Ambulatory Visit: Payer: Self-pay | Admitting: Internal Medicine

## 2020-12-16 ENCOUNTER — Other Ambulatory Visit: Payer: Self-pay | Admitting: Family Medicine

## 2020-12-16 NOTE — Telephone Encounter (Signed)
Requested Prescriptions  Pending Prescriptions Disp Refills  . omeprazole (PRILOSEC) 40 MG capsule [Pharmacy Med Name: OMEPRAZOLE 40MG  CAPSULES] 90 capsule 0    Sig: TAKE 1 CAPSULE BY MOUTH EVERY DAY BEFORE BREAKFAST AND 15 TO 30 MINUTES BEFORE FIRST MEAL OF THE DAY     Gastroenterology: Proton Pump Inhibitors Passed - 12/16/2020 12:02 PM      Passed - Valid encounter within last 12 months    Recent Outpatient Visits          4 days ago Type 2 diabetes mellitus with diabetic cataract, without long-term current use of insulin [facility])   [place], DO   5 months ago BPPV (benign paroxysmal positional vertigo), bilateral   [place], DO   6 months ago Acute bronchitis, unspecified organism   [place], DO   9 months ago ED (erectile dysfunction) of organic origin   [place], DO   11 months ago Seborrheic dermatitis of scalp   [place], DO      Future Appointments            In 4 months Parks Ranger, Devonne Doughty, DO [facility], [facility]

## 2020-12-16 NOTE — Telephone Encounter (Signed)
Filled today 12/16/20 Dr Parks Ranger

## 2020-12-18 ENCOUNTER — Encounter: Payer: Self-pay | Admitting: General Surgery

## 2020-12-20 DIAGNOSIS — I208 Other forms of angina pectoris: Secondary | ICD-10-CM | POA: Diagnosis not present

## 2020-12-20 DIAGNOSIS — R600 Localized edema: Secondary | ICD-10-CM | POA: Diagnosis not present

## 2020-12-26 DIAGNOSIS — E559 Vitamin D deficiency, unspecified: Secondary | ICD-10-CM | POA: Diagnosis not present

## 2020-12-26 DIAGNOSIS — E119 Type 2 diabetes mellitus without complications: Secondary | ICD-10-CM | POA: Diagnosis not present

## 2020-12-26 DIAGNOSIS — E1159 Type 2 diabetes mellitus with other circulatory complications: Secondary | ICD-10-CM | POA: Diagnosis not present

## 2020-12-26 DIAGNOSIS — I152 Hypertension secondary to endocrine disorders: Secondary | ICD-10-CM | POA: Diagnosis not present

## 2020-12-26 DIAGNOSIS — E785 Hyperlipidemia, unspecified: Secondary | ICD-10-CM | POA: Diagnosis not present

## 2020-12-26 DIAGNOSIS — E039 Hypothyroidism, unspecified: Secondary | ICD-10-CM | POA: Diagnosis not present

## 2020-12-26 DIAGNOSIS — E1169 Type 2 diabetes mellitus with other specified complication: Secondary | ICD-10-CM | POA: Diagnosis not present

## 2020-12-26 LAB — HEMOGLOBIN A1C: Hemoglobin A1C: 6.8

## 2020-12-28 ENCOUNTER — Other Ambulatory Visit: Payer: Self-pay | Admitting: Family Medicine

## 2020-12-28 DIAGNOSIS — I10 Essential (primary) hypertension: Secondary | ICD-10-CM

## 2020-12-28 DIAGNOSIS — E1169 Type 2 diabetes mellitus with other specified complication: Secondary | ICD-10-CM

## 2020-12-28 DIAGNOSIS — E785 Hyperlipidemia, unspecified: Secondary | ICD-10-CM

## 2020-12-28 NOTE — Telephone Encounter (Signed)
Metoprolol tartrate 25 mg tab approved per protocol.  Future OV 04/17/20. Requested Prescriptions  Pending Prescriptions Disp Refills  . atorvastatin (LIPITOR) 20 MG tablet [Pharmacy Med Name: ATORVASTATIN 20MG  TABLETS] 90 tablet 1    Sig: TAKE 1 TABLET(20 MG) BY MOUTH DAILY     Cardiovascular:  Antilipid - Statins Failed - 12/28/2020  7:09 AM      Failed - Total Cholesterol in normal range and within 360 days    Cholesterol, Total  Date Value Ref Range Status  06/19/2015 195 100 - 199 mg/dL Final   Cholesterol  Date Value Ref Range Status  11/09/2019 162 <200 mg/dL Final         Failed - LDL in normal range and within 360 days    LDL Cholesterol (Calc)  Date Value Ref Range Status  11/09/2019 86 mg/dL (calc) Final    Comment:    Reference range: <100 . Desirable range <100 mg/dL for primary prevention;   <70 mg/dL for patients with CHD or diabetic patients  with > or = 2 CHD risk factors. Marland Kitchen LDL-C is now calculated using the Martin-Hopkins  calculation, which is a validated novel method providing  better accuracy than the Friedewald equation in the  estimation of LDL-C.  Cresenciano Genre et al. Annamaria Helling. 3154;008(67): 2061-2068  (http://education.Quest[facility].com/faq/FAQ164)          Failed - HDL in normal range and within 360 days    HDL  Date Value Ref Range Status  11/09/2019 49 > OR = 40 mg/dL Final  06/19/2015 52 >39 mg/dL Final         Failed - Triglycerides in normal range and within 360 days    Triglycerides  Date Value Ref Range Status  11/09/2019 170 (H) <150 mg/dL Final         Passed - Patient is not pregnant      Passed - Valid encounter within last 12 months    Recent Outpatient Visits          2 weeks ago Type 2 diabetes mellitus with diabetic cataract, without long-term current use of insulin ([place])   [facility], Devonne Doughty, DO   5 months ago BPPV (benign paroxysmal positional vertigo), bilateral   [facility] [place], Devonne Doughty, DO   7 months ago Acute bronchitis, unspecified organism   [facility] [place], Devonne Doughty, DO   9 months ago ED (erectile dysfunction) of organic origin   [facility] [place], Devonne Doughty, DO   11 months ago Seborrheic dermatitis of scalp   [place], DO      Future Appointments            In 3 months Parks Ranger, Devonne Doughty, DO [facility]           . metoprolol tartrate (LOPRESSOR) 25 MG tablet [Pharmacy Med Name: METOPROLOL TARTRATE 25MG  TABLETS] 90 tablet 0    Sig: TAKE 1/2 TABLET(12.5 MG) BY MOUTH TWICE DAILY     Cardiovascular:  Beta Blockers Passed - 12/28/2020  7:09 AM      Passed - Last BP in normal range    BP Readings from Last 1 Encounters:  12/12/20 138/79         Passed - Last Heart Rate in normal range    Pulse Readings from Last 1 Encounters:  12/12/20 61         Passed - Valid  encounter within last 6 months    Recent Outpatient Visits          2 weeks ago Type 2 diabetes mellitus with diabetic cataract, without long-term current use of insulin [facility])   [place], DO   5 months ago BPPV (benign paroxysmal positional vertigo), bilateral   [place], DO   7 months ago Acute bronchitis, unspecified organism   [place], DO   9 months ago ED (erectile dysfunction) of organic origin   [place], DO   11 months ago Seborrheic dermatitis of scalp   [place], DO      Future Appointments            In 3 months Parks Ranger, Devonne Doughty, DO [facility], [facility]

## 2020-12-28 NOTE — Telephone Encounter (Signed)
Requested medication (s) are due for refill today Yes  Requested medication (s) are on the active medication list Yes  Future visit scheduled  Yes for 04/17/21.  Note to clinic-last ordered on 11/15/19 Order has expired. Routing to clinic for new order.    Requested Prescriptions  Pending Prescriptions Disp Refills   atorvastatin (LIPITOR) 20 MG tablet [Pharmacy Med Name: ATORVASTATIN 20MG  TABLETS] 90 tablet 1    Sig: TAKE 1 TABLET(20 MG) BY MOUTH DAILY     Cardiovascular:  Antilipid - Statins Failed - 12/28/2020  7:09 AM      Failed - Total Cholesterol in normal range and within 360 days    Cholesterol, Total  Date Value Ref Range Status  06/19/2015 195 100 - 199 mg/dL Final   Cholesterol  Date Value Ref Range Status  11/09/2019 162 <200 mg/dL Final          Failed - LDL in normal range and within 360 days    LDL Cholesterol (Calc)  Date Value Ref Range Status  11/09/2019 86 mg/dL (calc) Final    Comment:    Reference range: <100 . Desirable range <100 mg/dL for primary prevention;   <70 mg/dL for patients with CHD or diabetic patients  with > or = 2 CHD risk factors. Marland Kitchen LDL-C is now calculated using the Martin-Hopkins  calculation, which is a validated novel method providing  better accuracy than the Friedewald equation in the  estimation of LDL-C.  Cresenciano Genre et al. Annamaria Helling. 2620;355(97): 2061-2068  (http://education.[facility].com/faq/FAQ164)           Failed - HDL in normal range and within 360 days    HDL  Date Value Ref Range Status  11/09/2019 49 > OR = 40 mg/dL Final  06/19/2015 52 >39 mg/dL Final          Failed - Triglycerides in normal range and within 360 days    Triglycerides  Date Value Ref Range Status  11/09/2019 170 (H) <150 mg/dL Final          Passed - Patient is not pregnant      Passed - Valid encounter within last 12 months    Recent Outpatient Visits           2 weeks ago Type 2 diabetes mellitus with diabetic cataract,  without long-term current use of insulin ([place])   [facility], Devonne Doughty, DO   5 months ago BPPV (benign paroxysmal positional vertigo), bilateral   [facility] [place], Devonne Doughty, DO   7 months ago Acute bronchitis, unspecified organism   [facility] [place], Devonne Doughty, DO   9 months ago ED (erectile dysfunction) of organic origin   [facility] [place], Devonne Doughty, DO   11 months ago Seborrheic dermatitis of scalp   [place], DO       Future Appointments             In 3 months Parks Ranger, Devonne Doughty, DO [facility], [facility]            Signed Prescriptions Disp Refills   metoprolol tartrate (LOPRESSOR) 25 MG tablet 90 tablet 1    Sig: TAKE 1/2 TABLET(12.5 MG) BY MOUTH TWICE DAILY     Cardiovascular:  Beta Blockers Passed - 12/28/2020  7:09 AM      Passed - Last BP in normal range    BP Readings from Last 1 Encounters:  12/12/20 138/79          Passed - Last Heart Rate in normal range    Pulse Readings from Last 1 Encounters:  12/12/20 61          Passed - Valid encounter within last 6 months    Recent Outpatient Visits           2 weeks ago Type 2 diabetes mellitus with diabetic cataract, without long-term current use of insulin ([place])   [place], DO   5 months ago BPPV (benign paroxysmal positional vertigo), bilateral   [place], DO   7 months ago Acute bronchitis, unspecified organism   [place], DO   9 months ago ED (erectile dysfunction) of organic origin   [place], DO   11 months ago Seborrheic dermatitis of scalp   [place], DO       Future Appointments             In 3 months  [place] Ranger, Devonne Doughty, DO [facility], [facility]

## 2021-01-01 ENCOUNTER — Ambulatory Visit (INDEPENDENT_AMBULATORY_CARE_PROVIDER_SITE_OTHER): Payer: Medicare Other

## 2021-01-01 VITALS — Ht 66.0 in | Wt 168.0 lb

## 2021-01-01 DIAGNOSIS — Z0000 Encounter for general adult medical examination without abnormal findings: Secondary | ICD-10-CM | POA: Diagnosis not present

## 2021-01-01 NOTE — Patient Instructions (Signed)
Ross Riley , Thank you for taking time to come for your Medicare Wellness Visit. I appreciate your ongoing commitment to your health goals. Please review the following plan we discussed and let me know if I can assist you in the future.   Screening recommendations/referrals: Colonoscopy: completed 02/06/2017 Recommended yearly ophthalmology/optometry visit for glaucoma screening and checkup Recommended yearly dental visit for hygiene and checkup  Vaccinations: Influenza vaccine: completed 12/12/2020 Pneumococcal vaccine: completed 04/13/2018 Tdap vaccine: completed 12/15/2012, due 12/16/2022 Shingles vaccine: completed   Covid-19:  04/02/2020, 05/16/2019, 04/25/2019  Advanced directives: Advance directive discussed with you today.   Conditions/risks identified: none  Next appointment: Follow up in one year for your annual wellness visit.   Preventive Care 69 Years and Older, Male Preventive care refers to lifestyle choices and visits with your health care provider that can promote health and wellness. What does preventive care include? A yearly physical exam. This is also called an annual well check. Dental exams once or twice a year. Routine eye exams. Ask your health care provider how often you should have your eyes checked. Personal lifestyle choices, including: Daily care of your teeth and gums. Regular physical activity. Eating a healthy diet. Avoiding tobacco and drug use. Limiting alcohol use. Practicing safe sex. Taking low doses of aspirin every day. Taking vitamin and mineral supplements as recommended by your health care provider. What happens during an annual well check? The services and screenings done by your health care provider during your annual well check will depend on your age, overall health, lifestyle risk factors, and family history of disease. Counseling  Your health care provider may ask you questions about your: Alcohol use. Tobacco use. Drug  use. Emotional well-being. Home and relationship well-being. Sexual activity. Eating habits. History of falls. Memory and ability to understand (cognition). Work and work Statistician. Screening  You may have the following tests or measurements: Height, weight, and BMI. Blood pressure. Lipid and cholesterol levels. These may be checked every 5 years, or more frequently if you are over 88 years old. Skin check. Lung cancer screening. You may have this screening every year starting at age 39 if you have a 30-pack-year history of smoking and currently smoke or have quit within the past 15 years. Fecal occult blood test (FOBT) of the stool. You may have this test every year starting at age 76. Flexible sigmoidoscopy or colonoscopy. You may have a sigmoidoscopy every 5 years or a colonoscopy every 10 years starting at age 26. Prostate cancer screening. Recommendations will vary depending on your family history and other risks. Hepatitis C blood test. Hepatitis B blood test. Sexually transmitted disease (STD) testing. Diabetes screening. This is done by checking your blood sugar (glucose) after you have not eaten for a while (fasting). You may have this done every 1-3 years. Abdominal aortic aneurysm (AAA) screening. You may need this if you are a current or former smoker. Osteoporosis. You may be screened starting at age 46 if you are at high risk. Talk with your health care provider about your test results, treatment options, and if necessary, the need for more tests. Vaccines  Your health care provider may recommend certain vaccines, such as: Influenza vaccine. This is recommended every year. Tetanus, diphtheria, and acellular pertussis (Tdap, Td) vaccine. You may need a Td booster every 10 years. Zoster vaccine. You may need this after age 45. Pneumococcal 13-valent conjugate (PCV13) vaccine. One dose is recommended after age 91. Pneumococcal polysaccharide (PPSV23) vaccine. One dose is  recommended after age 63. Talk to your health care provider about which screenings and vaccines you need and how often you need them. This information is not intended to replace advice given to you by your health care provider. Make sure you discuss any questions you have with your health care provider. Document Released: 03/16/2015 Document Revised: 11/07/2015 Document Reviewed: 12/19/2014 Elsevier Interactive Patient Education  2017 [place] Prevention in the Home Falls can cause injuries. They can happen to people of all ages. There are many things you can do to make your home safe and to help prevent falls. What can I do on the outside of my home? Regularly fix the edges of walkways and driveways and fix any cracks. Remove anything that might make you trip as you walk through a door, such as a raised step or threshold. Trim any bushes or trees on the path to your home. Use bright outdoor lighting. Clear any walking paths of anything that might make someone trip, such as rocks or tools. Regularly check to see if handrails are loose or broken. Make sure that both sides of any steps have handrails. Any raised decks and porches should have guardrails on the edges. Have any leaves, snow, or ice cleared regularly. Use sand or salt on walking paths during winter. Clean up any spills in your garage right away. This includes oil or grease spills. What can I do in the bathroom? Use night lights. Install grab bars by the toilet and in the tub and shower. Do not use towel bars as grab bars. Use non-skid mats or decals in the tub or shower. If you need to sit down in the shower, use a plastic, non-slip stool. Keep the floor dry. Clean up any water that spills on the floor as soon as it happens. Remove soap buildup in the tub or shower regularly. Attach bath mats securely with double-sided non-slip rug tape. Do not have throw rugs and other things on the floor that can make you  trip. What can I do in the bedroom? Use night lights. Make sure that you have a light by your bed that is easy to reach. Do not use any sheets or blankets that are too big for your bed. They should not hang down onto the floor. Have a firm chair that has side arms. You can use this for support while you get dressed. Do not have throw rugs and other things on the floor that can make you trip. What can I do in the kitchen? Clean up any spills right away. Avoid walking on wet floors. Keep items that you use a lot in easy-to-reach places. If you need to reach something above you, use a strong step stool that has a grab bar. Keep electrical cords out of the way. Do not use floor polish or wax that makes floors slippery. If you must use wax, use non-skid floor wax. Do not have throw rugs and other things on the floor that can make you trip. What can I do with my stairs? Do not leave any items on the stairs. Make sure that there are handrails on both sides of the stairs and use them. Fix handrails that are broken or loose. Make sure that handrails are as long as the stairways. Check any carpeting to make sure that it is firmly attached to the stairs. Fix any carpet that is loose or worn. Avoid having throw rugs at the top or bottom of the stairs. If you  do have throw rugs, attach them to the floor with carpet tape. Make sure that you have a light switch at the top of the stairs and the bottom of the stairs. If you do not have them, ask someone to add them for you. What else can I do to help prevent falls? Wear shoes that: Do not have high heels. Have rubber bottoms. Are comfortable and fit you well. Are closed at the toe. Do not wear sandals. If you use a stepladder: Make sure that it is fully opened. Do not climb a closed stepladder. Make sure that both sides of the stepladder are locked into place. Ask someone to hold it for you, if possible. Clearly mark and make sure that you can  see: Any grab bars or handrails. First and last steps. Where the edge of each step is. Use tools that help you move around (mobility aids) if they are needed. These include: Canes. Walkers. Scooters. Crutches. Turn on the lights when you go into a dark area. Replace any light bulbs as soon as they burn out. Set up your furniture so you have a clear path. Avoid moving your furniture around. If any of your floors are uneven, fix them. If there are any pets around you, be aware of where they are. Review your medicines with your doctor. Some medicines can make you feel dizzy. This can increase your chance of falling. Ask your doctor what other things that you can do to help prevent falls. This information is not intended to replace advice given to you by your health care provider. Make sure you discuss any questions you have with your health care provider. Document Released: 12/14/2008 Document Revised: 07/26/2015 Document Reviewed: 03/24/2014 Elsevier Interactive Patient Education  2017 [employer].

## 2021-01-01 NOTE — Progress Notes (Signed)
I connected with Ross Riley today by telephone and verified that I am speaking with the correct person using two identifiers. Location patient: home Location provider: work Persons participating in the virtual visit: Ross Riley, Sheppard Coil (interpreter), Glenna Durand LPN.   I discussed the limitations, risks, security and privacy concerns of performing an evaluation and management service by telephone and the availability of in person appointments. I also discussed with the patient that there may be a patient responsible charge related to this service. The patient expressed understanding and verbally consented to this telephonic visit.    Interactive audio and video telecommunications were attempted between this provider and patient, however failed, due to patient having technical difficulties OR patient did not have access to video capability.  We continued and completed visit with audio only.     Vital signs may be patient reported or missing.  Subjective:   Ross Riley is a 70 y.o. male who presents for Medicare Annual/Subsequent preventive examination.  Review of Systems     Cardiac Risk Factors include: advanced age (>74mn, >>44women);diabetes mellitus;dyslipidemia;hypertension;male gender     Objective:    Today's Vitals   01/01/21 1321  Weight: 168 lb (76.2 kg)  Height: _0  (1.676 m)   Body mass index is 27.12 kg/m.  Advanced Directives 01/01/2021 11/11/2020 05/24/2019 04/13/2018 04/07/2017 05/20/2016 12/06/2014  Does Patient Have a Medical Advance Directive? _1  No No  Would patient like information on creating a medical advance directive? - - - Yes (MAU/Ambulatory/Procedural Areas - Information given) No - Patient declined No - Patient declined No - patient declined information    Current Medications (verified) Outpatient Encounter Medications as of 01/01/2021  Medication Sig   ACCU-CHEK FASTCLIX LANCETS MISC check blood sugar up to 1 time daily as  directed   Alcohol Swabs (B-D SINGLE USE SWABS REGULAR) PADS check blood sugar up to 1 time daily as directed   atorvastatin (LIPITOR) 20 MG tablet TAKE 1 TABLET(20 MG) BY MOUTH DAILY   benazepril-hydrochlorthiazide (LOTENSIN HCT) 10-12.5 MG tablet TAKE 1 TABLET BY MOUTH DAILY   betamethasone valerate (VALISONE) 0.1 % cream Apply topically 2 (two) times daily. For 2-4 weeks, apply to areas of scalp.   Blood Glucose Monitoring Suppl (ACCU-CHEK AVIVA PLUS) w/Device KIT Use device to check blood sugar up to 1 time daily as directed   Cholecalciferol (VITAMIN D3) 125 MCG (5000 UT) CAPS Take by mouth.   dorzolamide-timolol (COSOPT) 22.3-6.8 MG/ML ophthalmic solution dorzolamide 22.3 mg-timolol 6.8 mg/mL eye drops   glipiZIDE (GLUCOTROL XL) 5 MG 24 hr tablet TAKE 1 TABLET(5 MG) BY MOUTH DAILY WITH BREAKFAST   glucose blood test strip check blood sugar up to 1 time daily as directed   latanoprost (XALATAN) 0.005 % ophthalmic solution INT 1 GTT INTO OU QHS   levothyroxine (SYNTHROID) 75 MCG tablet Take 50 mcg by mouth.    meclizine (ANTIVERT) 25 MG tablet Take 1 tablet (25 mg total) by mouth 3 (three) times daily as needed for dizziness.   metoprolol tartrate (LOPRESSOR) 25 MG tablet TAKE 1/2 TABLET(12.5 MG) BY MOUTH TWICE DAILY   nortriptyline (PAMELOR) 10 MG capsule take 30 mg nightly for one week, then increase to 40 mg nightly   omeprazole (PRILOSEC) 40 MG capsule TAKE 1 CAPSULE BY MOUTH EVERY DAY BEFORE BREAKFAST AND 15 TO 30 MINUTES BEFORE FIRST MEAL OF THE DAY   ondansetron (ZOFRAN ODT) 4 MG disintegrating tablet Take 1 tablet (4 mg total) by mouth every 8 (  eight) hours as needed for nausea or vomiting.   sildenafil (VIAGRA) 100 MG tablet Take 1 tablet (100 mg total) by mouth daily as needed for erectile dysfunction. Take two hours prior to intercourse on an empty stomach   albuterol (VENTOLIN HFA) 108 (90 Base) MCG/ACT inhaler Inhale 2 puffs into the lungs every 4 (four) hours as needed for  shortness of breath (cough). (Patient not taking: Reported on 01/01/2021)   aspirin EC 81 MG tablet Take by mouth. (Patient not taking: Reported on 01/01/2021)   Ciclopirox 1 % shampoo Apply 5 mLs topically 2 (two) times a week. Leave shampoo on scalp for 5 minutes, then rinse off. Use for total of 4 weeks. May repeat if problem returns. (Patient not taking: Reported on 01/01/2021)   naproxen (NAPROSYN) 500 MG tablet Take 1 tablet (500 mg total) by mouth 2 (two) times daily with a meal. For 2-4 weeks then as needed (Patient not taking: Reported on 01/01/2021)   predniSONE (DELTASONE) 20 MG tablet Take daily with food. Start with 54m (3 pills) x 2 days, then reduce to 467m(2 pills) x 2 days, then 204m1 pill) x 3 days (Patient not taking: Reported on 01/01/2021)   No facility-administered encounter medications on file as of 01/01/2021.    Allergies (verified) Metformin and related and Metformin hcl   History: Past Medical History:  Diagnosis Date   Arthritis    BPH (benign prostatic hyperplasia)    Chronic kidney disease    kidney stones   Dystonia    ED (erectile dysfunction)    GERD (gastroesophageal reflux disease)    Glaucoma (increased eye pressure)    Hematuria    Hemorrhoids    Hyperlipidemia    Hypertension    Thyroid disease    Past Surgical History:  Procedure Laterality Date   CATARACT EXTRACTION Bilateral    EXTRACORPOREAL SHOCK WAVE LITHOTRIPSY Right    JOINT REPLACEMENT Left 08/2016   left knee, [place]   KIDNEY STONE SURGERY  2009   PROSTATE SURGERY  2011   TRANSURETHRAL RESECTION OF PROSTATE N/A 12/06/2014   Procedure: TRANSURETHRAL RESECTION OF THE PROSTATE (TURP);  Surgeon: BriNickie RetortD;  Location: [facility] ORS;  Service: Urology;  Laterality: N/A;   Family History  Problem Relation Age of Onset   Heart disease Mother    Social History   Socioeconomic History   Marital status: Married    Spouse name: Not on file   Number of children: Not on file    Years of education: Not on file   Highest education level: 9th grade  Occupational History   Occupation: retired  Tobacco Use   Smoking status: Former    Packs/day: 1.00    Years: 32.00    Pack years: 32.00    Types: Cigarettes    Quit date: 03/04/1991    Years since quitting: 29.8   Smokeless tobacco: Former  VapScientific laboratory techniciane: Never used  Substance and Sexual Activity   Alcohol use: Yes    Alcohol/week: 0.0 standard drinks    Comment: occasional   Drug use: No   Sexual activity: Yes    Birth control/protection: None  Other Topics Concern   Not on file  Social History Narrative   Not on file   Social Determinants of Health   Financial Resource Strain: Low Risk    Difficulty of Paying Living Expenses: Not hard at all  Food Insecurity: No Food Insecurity   Worried About Running Out  of Food in the Last Year: Never true   [place] in the Last Year: Never true  Transportation Needs: No Transportation Needs   Lack of Transportation (Medical): No   Lack of Transportation (Non-Medical): No  Physical Activity: Insufficiently Active   Days of Exercise per Week: 1 day   Minutes of Exercise per Session: 60 min  Stress: No Stress Concern Present   Feeling of Stress : Not at all  Social Connections: Not on file    Tobacco Counseling Counseling given: Not Answered   Clinical Intake:  Pre-visit preparation completed: Yes  Pain : No/denies pain     Nutritional Status: BMI 25 -29 Overweight Nutritional Risks: None Diabetes: Yes  What is the last grade level you completed in school?: 8th grade  Diabetic? Yes Nutrition Risk Assessment:  Has the patient had any N/V/D within the last 2 months?  No  Does the patient have any non-healing wounds?  No  Has the patient had any unintentional weight loss or weight gain?  No   Diabetes:  Is the patient diabetic?  Yes  If diabetic, was a CBG obtained today?  No  Did the patient bring in their glucometer from  home?  No  How often do you monitor your CBG's? 1-2 weekly.   Financial Strains and Diabetes Management:  Are you having any financial strains with the device, your supplies or your medication? No .  Does the patient want to be seen by Chronic Care Management for management of their diabetes?  No  Would the patient like to be referred to a Nutritionist or for Diabetic Management?  No   Diabetic Exams:  Diabetic Eye Exam: Overdue for diabetic eye exam. Pt has been advised about the importance in completing this exam. Patient advised to call and schedule an eye exam. Diabetic Foot Exam: Completed 12/12/2020   Interpreter Needed?: Yes Interpreter Name: Leeann Must ID: [id number]  Information entered by :: NAllen LPN   Activities of Daily Living In your present state of health, do you have any difficulty performing the following activities: 01/01/2021  Hearing? N  Vision? N  Difficulty concentrating or making decisions? Y  Comment sometimes  Walking or climbing stairs? Y  Dressing or bathing? N  Doing errands, shopping? N  Preparing Food and eating ? N  Using the Toilet? N  In the past six months, have you accidently leaked urine? N  Do you have problems with loss of bowel control? N  Managing your Medications? N  Managing your Finances? N  Housekeeping or managing your Housekeeping? N  Some recent data might be hidden    Patient Care Team: Olin Hauser, DO as PCP - General (Family Medicine) Ardelle Lesches, MD as Referring Physician (Internal Medicine)  Indicate any recent Medical Services you may have received from other than [facility] providers in the past year (date may be approximate).     Assessment:   This is a routine wellness examination for [facility].  Hearing/Vision screen Vision Screening - Comments:: Regular eye exams, Joya San  Dietary issues and exercise activities discussed: Current Exercise Habits: Home exercise routine, Type of exercise:  Other - see comments (stationary bike), Time (Minutes): 60, Frequency (Times/Week): 1, Weekly Exercise (Minutes/Week): 60   Goals Addressed             This Visit's Progress    Patient Stated       01/01/2021, no goals       Depression Screen PHQ 2/9  Scores 01/01/2021 11/15/2019 05/24/2019 11/05/2018 09/20/2018 04/21/2018 04/13/2018  PHQ - 2 Score 0 0 0 0 0 0 0  PHQ- 9 Score - - - - - - -    Fall Risk Fall Risk  01/01/2021 07/18/2020 03/16/2020 11/15/2019 06/09/2019  Falls in the past year? 0 0 0 0 0  Number falls in past yr: - 0 0 0 0  Injury with Fall? - 0 0 0 0  Risk for fall due to : Medication side effect - - - -  Follow up Falls evaluation completed;Education provided;Falls prevention discussed Falls evaluation completed - Falls evaluation completed -    FALL RISK PREVENTION PERTAINING TO THE HOME:  Any stairs in or around the home? Yes  If so, are there any without handrails? No  Home free of loose throw rugs in walkways, pet beds, electrical cords, etc? Yes  Adequate lighting in your home to reduce risk of falls? Yes   ASSISTIVE DEVICES UTILIZED TO PREVENT FALLS:  Life alert? No  Use of a cane, walker or w/c? Yes  Grab bars in the bathroom? No  Shower chair or bench in shower? No  Elevated toilet seat or a handicapped toilet? No   TIMED UP AND GO:  Was the test performed? No .      Cognitive Function:        Immunizations Immunization History  Administered Date(s) Administered   Fluad Quad(high Dose 65+) 11/05/2018, 11/15/2019, 12/12/2020   Influenza, High Dose Seasonal PF 11/24/2016   Influenza,inj,Quad PF,6+ Mos 12/15/2012, 12/07/2014   Influenza-Unspecified 12/07/2014, 02/13/2016, 12/02/2017   PFIZER(Purple Top)SARS-COV-2 Vaccination 04/25/2019, 05/16/2019   Pneumococcal Conjugate-13 04/07/2017   Pneumococcal Polysaccharide-23 04/25/2013, 04/13/2018   Tdap 12/15/2012   Zoster Recombinat (Shingrix) 06/22/2016, 04/15/2018    TDAP status: Up to  date  Flu Vaccine status: Up to date  Pneumococcal vaccine status: Up to date  Covid-19 vaccine status: Completed vaccines  Qualifies for Shingles Vaccine? Yes   Zostavax completed No   Shingrix Completed?: Yes  Screening Tests Health Maintenance  Topic Date Due   COVID-19 Vaccine (3 - Pfizer risk series) 06/13/2019   OPHTHALMOLOGY EXAM  09/27/2019   HEMOGLOBIN A1C  10/07/2020   FOOT EXAM  12/12/2021   TETANUS/TDAP  12/16/2022   COLONOSCOPY (Pts 45-61yr Insurance coverage will need to be confirmed)  02/07/2027   Pneumonia Vaccine 69 Years old  Completed   INFLUENZA VACCINE  Completed   Hepatitis C Screening  Completed   Zoster Vaccines- Shingrix  Completed   HPV VACCINES  Aged Out    Health Maintenance  Health Maintenance Due  Topic Date Due   COVID-19 Vaccine (3 - Pfizer risk series) 06/13/2019   OPHTHALMOLOGY EXAM  09/27/2019   HEMOGLOBIN A1C  10/07/2020    Colorectal cancer screening: Type of screening: Colonoscopy. Completed 02/06/2017. Repeat every 10 years  Lung Cancer Screening: (Low Dose CT Chest recommended if Age 69-80years, 30 pack-year currently smoking OR have quit w/in 15years.) does not qualify.   Lung Cancer Screening Referral: no  Additional Screening:  Hepatitis C Screening: does qualify; Completed 09/08/2020  Vision Screening: Recommended annual ophthalmology exams for early detection of glaucoma and other disorders of the eye. Is the patient up to date with their annual eye exam?  No  Who is the provider or what is the name of the office in which the patient attends annual eye exams? TJoya SanIf pt is not established with a provider, would they like to be referred to a provider  to establish care? No .   Dental Screening: Recommended annual dental exams for proper oral hygiene  Community Resource Referral / Chronic Care Management: CRR required this visit?  No   CCM required this visit?  No      Plan:     I have personally reviewed and  noted the following in the patient's chart:   Medical and social history Use of alcohol, tobacco or illicit drugs  Current medications and supplements including opioid prescriptions. Patient is not currently taking opioid prescriptions. Functional ability and status Nutritional status Physical activity Advanced directives List of other physicians Hospitalizations, surgeries, and ER visits in previous 12 months Vitals Screenings to include cognitive, depression, and falls Referrals and appointments  In addition, I have reviewed and discussed with patient certain preventive protocols, quality metrics, and best practice recommendations. A written personalized care plan for preventive services as well as general preventive health recommendations were provided to patient.     Kellie Simmering, LPN   21/04/2480   Nurse Notes: 6 CIT not administered due to language barrier.

## 2021-01-22 ENCOUNTER — Other Ambulatory Visit: Payer: Self-pay | Admitting: Internal Medicine

## 2021-01-22 DIAGNOSIS — I10 Essential (primary) hypertension: Secondary | ICD-10-CM

## 2021-01-22 DIAGNOSIS — R2689 Other abnormalities of gait and mobility: Secondary | ICD-10-CM | POA: Diagnosis not present

## 2021-01-22 DIAGNOSIS — E1136 Type 2 diabetes mellitus with diabetic cataract: Secondary | ICD-10-CM | POA: Diagnosis not present

## 2021-01-23 DIAGNOSIS — M19072 Primary osteoarthritis, left ankle and foot: Secondary | ICD-10-CM | POA: Diagnosis not present

## 2021-01-23 DIAGNOSIS — M216X2 Other acquired deformities of left foot: Secondary | ICD-10-CM | POA: Diagnosis not present

## 2021-01-28 ENCOUNTER — Other Ambulatory Visit: Payer: Self-pay | Admitting: Family Medicine

## 2021-01-28 DIAGNOSIS — K219 Gastro-esophageal reflux disease without esophagitis: Secondary | ICD-10-CM

## 2021-01-29 NOTE — Telephone Encounter (Signed)
Requested medications are due for refill today requesting early  Requested medications are on the active medication list yes  Last refill 12/16/20  Last visit 12/12/20  Future visit scheduled 04/17/21  Notes to clinic signature states to take before breakfast and before first meal of the day, please assess, also requesting early. Requested Prescriptions  Pending Prescriptions Disp Refills   omeprazole (PRILOSEC) 40 MG capsule [Pharmacy Med Name: OMEPRAZOLE 40MG  CAPSULES] 180 capsule     Sig: TAKE 1 CAPSULE BY MOUTH EVERY DAY BEFORE BREAKFAST AND 15 TO 30 MINUTES BEFORE FIRST MEAL OF THE DAY     Gastroenterology: Proton Pump Inhibitors Passed - 01/28/2021 10:03 AM      Passed - Valid encounter within last 12 months    Recent Outpatient Visits           1 month ago Type 2 diabetes mellitus with diabetic cataract, without long-term current use of insulin ([place])   [place], DO   6 months ago BPPV (benign paroxysmal positional vertigo), bilateral   [place], DO   8 months ago Acute bronchitis, unspecified organism   [facility] [place], Devonne Doughty, DO   10 months ago ED (erectile dysfunction) of organic origin   [place], DO   1 year ago Seborrheic dermatitis of scalp   [place], DO       Future Appointments             In 2 months [place], Devonne Doughty, DO [facility], [facility]

## 2021-02-04 ENCOUNTER — Telehealth: Payer: Medicare Other | Admitting: Family Medicine

## 2021-02-04 ENCOUNTER — Ambulatory Visit: Payer: Self-pay | Admitting: *Deleted

## 2021-02-04 DIAGNOSIS — R079 Chest pain, unspecified: Secondary | ICD-10-CM

## 2021-02-04 NOTE — Telephone Encounter (Signed)
Upper respiratory symptoms including dry cough, congestion, HA, fatigue and chest hurts when he coughs.  Began yesterday No fever Care advice offered.  Appointment scheduled for tomorrow. Patient okay with plan     Answer Assessment - Initial Assessment Questions 1. ONSET: "When did the nasal discharge start?"      no 2. AMOUNT: "How much discharge is there?"      na 3. COUGH: "Do you have a cough?" If yes, ask: "Describe the color of your sputum" (clear, white, yellow, green)     none 4. RESPIRATORY DISTRESS: "Describe your breathing."      Short of breath 5. FEVER: "Do you have a fever?" If Yes, ask: "What is your temperature, how was it measured, and when did it start?"     none 6. SEVERITY: "Overall, how bad are you feeling right now?" (e.g., doesn't interfere with normal activities, staying home from school/work, staying in bed)       7. OTHER SYMPTOMS: "Do you have any other symptoms?" (e.g., sore throat, earache, wheezing, vomiting)     Sore throat, HA, fatigue 8. PREGNANCY: "Is there any chance you are pregnant?" "When was your last menstrual period?"     na  Protocols used: Common Cold-A-AH

## 2021-02-04 NOTE — Progress Notes (Signed)
[place]  No response to follow up questions  Due to chest pain- message sent to go to be seen in person  Closed this visit-

## 2021-02-05 ENCOUNTER — Ambulatory Visit (INDEPENDENT_AMBULATORY_CARE_PROVIDER_SITE_OTHER): Payer: Medicare Other | Admitting: Family Medicine

## 2021-02-05 ENCOUNTER — Other Ambulatory Visit: Payer: Self-pay

## 2021-02-05 ENCOUNTER — Encounter: Payer: Self-pay | Admitting: Family Medicine

## 2021-02-05 VITALS — BP 137/64 | HR 90 | Ht 66.0 in | Wt 168.0 lb

## 2021-02-05 DIAGNOSIS — U071 COVID-19: Secondary | ICD-10-CM | POA: Diagnosis not present

## 2021-02-05 DIAGNOSIS — R519 Headache, unspecified: Secondary | ICD-10-CM | POA: Diagnosis not present

## 2021-02-05 DIAGNOSIS — R051 Acute cough: Secondary | ICD-10-CM | POA: Diagnosis not present

## 2021-02-05 DIAGNOSIS — J0110 Acute frontal sinusitis, unspecified: Secondary | ICD-10-CM

## 2021-02-05 DIAGNOSIS — J209 Acute bronchitis, unspecified: Secondary | ICD-10-CM | POA: Diagnosis not present

## 2021-02-05 LAB — POCT INFLUENZA A/B
Influenza A, POC: NEGATIVE
Influenza B, POC: NEGATIVE

## 2021-02-05 MED ORDER — ALBUTEROL SULFATE HFA 108 (90 BASE) MCG/ACT IN AERS: 2.0000 | INHALATION_SPRAY | RESPIRATORY_TRACT | 0 refills | Status: DC | PRN

## 2021-02-05 MED ORDER — AZITHROMYCIN 250 MG PO TABS: ORAL_TABLET | ORAL | 0 refills | Status: DC

## 2021-02-05 MED ORDER — PSEUDOEPH-BROMPHEN-DM 30-2-10 MG/5ML PO SYRP
5.0000 mL | ORAL_SOLUTION | Freq: Four times a day (QID) | ORAL | 0 refills | Status: DC | PRN
Start: 2021-02-05 — End: 2021-09-10

## 2021-02-05 NOTE — Progress Notes (Signed)
Subjective:    Patient ID: Ross Riley, male    DOB: [birth year], 70 y.o.   MRN: [id number]  FREDRIC SLABACH is a 70 y.o. male presenting on 02/05/2021 for Cough and Headache   HPI  Acute URI Sinusitis Cough Headache Symptoms with recent onset viral syndrome or respiratory infection. No sick contact. No covid test or flu test prior. UTD COVID and Flu Vaccine No fever Has cough productive more chest congestion and headache Denies nausea vomiting abdominal pain, dyspnea chest pain  Depression screen [facility] 2/9 01/01/2021 11/15/2019 05/24/2019  Decreased Interest 0 0 0  Down, Depressed, Hopeless 0 0 0  PHQ - 2 Score 0 0 0  Altered sleeping - - -  Tired, decreased energy - - -  Change in appetite - - -  Feeling bad or failure about yourself  - - -  Trouble concentrating - - -  Moving slowly or fidgety/restless - - -  Suicidal thoughts - - -  PHQ-9 Score - - -  Difficult doing work/chores - - -    Social History   Tobacco Use   Smoking status: Former    Packs/day: 1.00    Years: 32.00    Pack years: 32.00    Types: Cigarettes    Quit date: 03/04/1991    Years since quitting: 29.9   Smokeless tobacco: Former  Scientific laboratory technician Use: Never used  Substance Use Topics   Alcohol use: Yes    Alcohol/week: 0.0 standard drinks    Comment: occasional   Drug use: No    Review of Systems Per HPI unless specifically indicated above     Objective:    BP 137/64 (BP Location: Left Arm, Patient Position: Sitting, Cuff Size: Normal)   Pulse 90   Ht 5\' 6"  (1.676 m)   Wt 168 lb (76.2 kg)   BMI 27.12 kg/m   Wt Readings from Last 3 Encounters:  02/05/21 168 lb (76.2 kg)  01/01/21 168 lb (76.2 kg)  12/12/20 169 lb (76.7 kg)    Physical Exam Vitals and nursing note reviewed.  Constitutional:      General: He is not in acute distress.    Appearance: He is well-developed. He is not diaphoretic.     Comments: Well-appearing, comfortable, cooperative  HENT:     Head:  Normocephalic and atraumatic.  Eyes:     General:        Right eye: No discharge.        Left eye: No discharge.     Conjunctiva/sclera: Conjunctivae normal.  Neck:     Thyroid: No thyromegaly.  Cardiovascular:     Rate and Rhythm: Normal rate and regular rhythm.     Pulses: Normal pulses.     Heart sounds: Normal heart sounds. No murmur heard. Pulmonary:     Effort: Pulmonary effort is normal. No respiratory distress.     Breath sounds: Normal breath sounds. No wheezing or rales.     Comments: cough Musculoskeletal:        General: Normal range of motion.     Cervical back: Normal range of motion and neck supple.  Lymphadenopathy:     Cervical: No cervical adenopathy.  Skin:    General: Skin is warm and dry.     Findings: No erythema or rash.  Neurological:     Mental Status: He is alert and oriented to person, place, and time. Mental status is at baseline.  Psychiatric:  Behavior: Behavior normal.     Comments: Well groomed, good eye contact, normal speech and thoughts   Results for orders placed or performed in visit on 02/05/21  POCT Influenza A/B  Result Value Ref Range   Influenza A, POC Negative Negative   Influenza B, POC Negative Negative      Assessment & Plan:   Problem List Items Addressed This Visit   None Visit Diagnoses     Acute non-recurrent frontal sinusitis    -  Primary   Relevant Medications   brompheniramine-pseudoephedrine-DM 30-2-10 MG/5ML syrup   azithromycin (ZITHROMAX Z-PAK) 250 MG tablet   Acute cough       Relevant Medications   brompheniramine-pseudoephedrine-DM 30-2-10 MG/5ML syrup   Other Relevant Orders   COVID-19, Flu A+B and RSV   POCT Influenza A/B (Completed)   Nonintractable headache, unspecified chronicity pattern, unspecified headache type       Relevant Orders   COVID-19, Flu A+B and RSV   Acute bronchitis, unspecified organism       Relevant Medications   albuterol (VENTOLIN HFA) 108 (90 Base) MCG/ACT inhaler       Sinusitis with cough  May have respiratory virus causing bronchitis as well Flu was negative today in office  Start Azithromycin Z pak (antibiotic) 2 tabs day 1, then 1 tab x 4 days, complete entire course even if improved  Cough syrup as ordered Albuterol inhaler for cough  Swab today for CoVID RSV Flu pending result for a few days we can call with result   Meds ordered this encounter  Medications   brompheniramine-pseudoephedrine-DM 30-2-10 MG/5ML syrup    Sig: Take 5 mLs by mouth 4 (four) times daily as needed.    Dispense:  200 mL    Refill:  0   albuterol (VENTOLIN HFA) 108 (90 Base) MCG/ACT inhaler    Sig: Inhale 2 puffs into the lungs every 4 (four) hours as needed for shortness of breath (cough).    Dispense:  6.7 each    Refill:  0   azithromycin (ZITHROMAX Z-PAK) 250 MG tablet    Sig: Take 2 tabs (500mg  total) on Day 1. Take 1 tab (250mg ) daily for next 4 days.    Dispense:  6 tablet    Refill:  0      Follow up plan: Return if symptoms worsen or fail to improve.   [name], [facility] 02/05/2021, 4:01 PM

## 2021-02-05 NOTE — Patient Instructions (Addendum)
Thank you for coming to the office today.  May have respiratory virus causing bronchitis Flu was negative today in office Start Azithromycin Z pak (antibiotic) 2 tabs day 1, then 1 tab x 4 days, complete entire course even if improved Cough syrup as ordered Albuterol inhaler for cough  Swab today for CoVID RSV Flu pending result for a few days we can call with result  Please schedule a Follow-up Appointment to: Return if symptoms worsen or fail to improve.  If you have any other questions or concerns, please feel free to call the office or send a message through [place]. You may also schedule an earlier appointment if necessary.  Additionally, you may be receiving a survey about your experience at our office within a few days to 1 week by e-mail or mail. We value your feedback.  [name], DO [place]

## 2021-02-07 LAB — COVID-19, FLU A+B AND RSV
Influenza A, NAA: NOT DETECTED
Influenza B, NAA: NOT DETECTED
RSV, NAA: NOT DETECTED
SARS-CoV-2, NAA: DETECTED — AB

## 2021-02-07 MED ORDER — NIRMATRELVIR/RITONAVIR (PAXLOVID)TABLET: 3.0000 | ORAL_TABLET | Freq: Two times a day (BID) | ORAL | 0 refills | Status: AC

## 2021-02-07 NOTE — Addendum Note (Signed)
Addended by: Olin Hauser on: 02/07/2021 03:01 PM   Modules accepted: Orders

## 2021-03-13 DIAGNOSIS — M24572 Contracture, left ankle: Secondary | ICD-10-CM | POA: Diagnosis not present

## 2021-03-13 DIAGNOSIS — G249 Dystonia, unspecified: Secondary | ICD-10-CM | POA: Diagnosis not present

## 2021-03-13 DIAGNOSIS — M19072 Primary osteoarthritis, left ankle and foot: Secondary | ICD-10-CM | POA: Diagnosis not present

## 2021-03-13 DIAGNOSIS — E119 Type 2 diabetes mellitus without complications: Secondary | ICD-10-CM | POA: Diagnosis not present

## 2021-03-13 DIAGNOSIS — M216X2 Other acquired deformities of left foot: Secondary | ICD-10-CM | POA: Diagnosis not present

## 2021-04-17 ENCOUNTER — Ambulatory Visit (INDEPENDENT_AMBULATORY_CARE_PROVIDER_SITE_OTHER): Payer: Medicare Other | Admitting: Family Medicine

## 2021-04-17 ENCOUNTER — Other Ambulatory Visit: Payer: Self-pay

## 2021-04-17 ENCOUNTER — Encounter: Payer: Self-pay | Admitting: Family Medicine

## 2021-04-17 VITALS — BP 133/75 | HR 63 | Ht 66.0 in | Wt 165.4 lb

## 2021-04-17 DIAGNOSIS — E039 Hypothyroidism, unspecified: Secondary | ICD-10-CM

## 2021-04-17 DIAGNOSIS — Z0000 Encounter for general adult medical examination without abnormal findings: Secondary | ICD-10-CM

## 2021-04-17 DIAGNOSIS — N400 Enlarged prostate without lower urinary tract symptoms: Secondary | ICD-10-CM

## 2021-04-17 DIAGNOSIS — E1136 Type 2 diabetes mellitus with diabetic cataract: Secondary | ICD-10-CM

## 2021-04-17 NOTE — Assessment & Plan Note (Signed)
Followed by [facility] Endocrinology Controlled A1c 6.8 previous result. No known hypoglycemia. Complications - DM cataracts removed, other including hyperlipidemia, GERD, hypothyroidism - increases risk of future cardiovascular complications   Plan:  1. Continue current therapy - Glipizide XL 5mg  daily - caution hypoglycemia 2. Encourage improved lifestyle - low carb, low sugar diet, reduce portion size, continue improving regular exercise 3. Check CBG , bring log to next visit for review 4. Continue ASA, Statin

## 2021-04-17 NOTE — Progress Notes (Signed)
Subjective:    Patient ID: Ross Riley, male    DOB: October 12, 1950, 71 y.o.   MRN: [id number]  Ross Riley is a 71 y.o. male presenting on 04/17/2021 for Annual Exam   HPI  Here for Annual Physical and Due for labs. Already had blood work from Endocrinology and Cardiology 12/2020 and again upcoming 06/2021  He is doing well today Improving lifestyle  Diabetes, Type 2 Followed by [facility] Last A1c 6.8 (12/26/20) Meds: Glipizide XL $RemoveBefo'5mg'bPUZUYJMrOR$  daily Reports good compliance. Tolerating well w/o side-effects Currently on ACEi, ASA, Statin Lifestyle: - Diet (balanced diet) - Exercise (limited activity due to chronic LLE issues see note) He will return to [facility] this month 04/2021   Depression screen [facility] 2/9 04/17/2021 01/01/2021 11/15/2019  Decreased Interest 0 0 0  Down, Depressed, Hopeless 0 0 0  PHQ - 2 Score 0 0 0  Altered sleeping 0 - -  Tired, decreased energy 0 - -  Change in appetite 0 - -  Feeling bad or failure about yourself  0 - -  Trouble concentrating 0 - -  Moving slowly or fidgety/restless 0 - -  Suicidal thoughts 0 - -  PHQ-9 Score 0 - -  Difficult doing work/chores Not difficult at all - -    Past Medical History:  Diagnosis Date   Arthritis    BPH (benign prostatic hyperplasia)    Chronic kidney disease    kidney stones   Dystonia    ED (erectile dysfunction)    GERD (gastroesophageal reflux disease)    Glaucoma (increased eye pressure)    Hematuria    Hemorrhoids    Hyperlipidemia    Hypertension    Thyroid disease    Past Surgical History:  Procedure Laterality Date   CATARACT EXTRACTION Bilateral    EXTRACORPOREAL SHOCK WAVE LITHOTRIPSY Right    JOINT REPLACEMENT Left 08/2016   left knee, [place]   KIDNEY STONE SURGERY  2009   PROSTATE SURGERY  2011   TRANSURETHRAL RESECTION OF PROSTATE N/A 12/06/2014   Procedure: TRANSURETHRAL RESECTION OF THE PROSTATE (TURP);  Surgeon: Nickie Retort, MD;  Location: [facility] ORS;  Service:  Urology;  Laterality: N/A;   Social History   Socioeconomic History   Marital status: Married    Spouse name: Not on file   Number of children: Not on file   Years of education: Not on file   Highest education level: 9th grade  Occupational History   Occupation: retired  Tobacco Use   Smoking status: Former    Packs/day: 1.00    Years: 32.00    Pack years: 32.00    Types: Cigarettes    Quit date: 03/04/1991    Years since quitting: 30.1   Smokeless tobacco: Former  Scientific laboratory technician Use: Never used  Substance and Sexual Activity   Alcohol use: Yes    Alcohol/week: 0.0 standard drinks    Comment: occasional   Drug use: No   Sexual activity: Yes    Birth control/protection: None  Other Topics Concern   Not on file  Social History Narrative   Not on file   Social Determinants of Health   Financial Resource Strain: Low Risk    Difficulty of Paying Living Expenses: Not hard at all  Food Insecurity: No Food Insecurity   Worried About Charity fundraiser in the Last Year: Never true   Ross Riley in the Last Year: Never true  Transportation Needs:  No Transportation Needs   Lack of Transportation (Medical): No   Lack of Transportation (Non-Medical): No  Physical Activity: Insufficiently Active   Days of Exercise per Week: 1 day   Minutes of Exercise per Session: 60 min  Stress: No Stress Concern Present   Feeling of Stress : Not at all  Social Connections: Not on file  Intimate Partner Violence: Not on file   Family History  Problem Relation Age of Onset   Heart disease Mother    Current Outpatient Medications on File Prior to Visit  Medication Sig   ACCU-CHEK FASTCLIX LANCETS MISC check blood sugar up to 1 time daily as directed   albuterol (VENTOLIN HFA) 108 (90 Base) MCG/ACT inhaler Inhale 2 puffs into the lungs every 4 (four) hours as needed for shortness of breath (cough).   Alcohol Swabs (B-D SINGLE USE SWABS REGULAR) PADS check blood sugar up to 1 time  daily as directed   atorvastatin (LIPITOR) 20 MG tablet TAKE 1 TABLET(20 MG) BY MOUTH DAILY   benazepril-hydrochlorthiazide (LOTENSIN HCT) 10-12.5 MG tablet TAKE 1 TABLET BY MOUTH DAILY   betamethasone valerate (VALISONE) 0.1 % cream Apply topically 2 (two) times daily. For 2-4 weeks, apply to areas of scalp.   Blood Glucose Monitoring Suppl (ACCU-CHEK AVIVA PLUS) w/Device KIT Use device to check blood sugar up to 1 time daily as directed   brompheniramine-pseudoephedrine-DM 30-2-10 MG/5ML syrup Take 5 mLs by mouth 4 (four) times daily as needed.   Cholecalciferol (VITAMIN D3) 125 MCG (5000 UT) CAPS Take by mouth.   Ciclopirox 1 % shampoo Apply 5 mLs topically 2 (two) times a week. Leave shampoo on scalp for 5 minutes, then rinse off. Use for total of 4 weeks. May repeat if problem returns.   dorzolamide-timolol (COSOPT) 22.3-6.8 MG/ML ophthalmic solution dorzolamide 22.3 mg-timolol 6.8 mg/mL eye drops   glipiZIDE (GLUCOTROL XL) 5 MG 24 hr tablet TAKE 1 TABLET(5 MG) BY MOUTH DAILY WITH BREAKFAST   glucose blood test strip check blood sugar up to 1 time daily as directed   latanoprost (XALATAN) 0.005 % ophthalmic solution INT 1 GTT INTO OU QHS   levothyroxine (SYNTHROID) 75 MCG tablet Take 50 mcg by mouth.    meclizine (ANTIVERT) 25 MG tablet Take 1 tablet (25 mg total) by mouth 3 (three) times daily as needed for dizziness.   metoprolol tartrate (LOPRESSOR) 25 MG tablet TAKE 1/2 TABLET(12.5 MG) BY MOUTH TWICE DAILY   naproxen (NAPROSYN) 500 MG tablet Take 1 tablet (500 mg total) by mouth 2 (two) times daily with a meal. For 2-4 weeks then as needed   nortriptyline (PAMELOR) 10 MG capsule take 30 mg nightly for one week, then increase to 40 mg nightly   omeprazole (PRILOSEC) 40 MG capsule Take 1 capsule (40 mg total) by mouth daily before breakfast.   ondansetron (ZOFRAN ODT) 4 MG disintegrating tablet Take 1 tablet (4 mg total) by mouth every 8 (eight) hours as needed for nausea or vomiting.    sildenafil (VIAGRA) 100 MG tablet Take 1 tablet (100 mg total) by mouth daily as needed for erectile dysfunction. Take two hours prior to intercourse on an empty stomach   aspirin EC 81 MG tablet Take by mouth. (Patient not taking: Reported on 01/01/2021)   No current facility-administered medications on file prior to visit.    Review of Systems  Constitutional:  Negative for activity change, appetite change, chills, diaphoresis, fatigue and fever.  HENT:  Negative for congestion and hearing loss.  Eyes:  Negative for visual disturbance.  Respiratory:  Negative for cough, chest tightness, shortness of breath and wheezing.   Cardiovascular:  Negative for chest pain, palpitations and leg swelling.  Gastrointestinal:  Negative for abdominal pain, constipation, diarrhea, nausea and vomiting.  Genitourinary:  Negative for dysuria, frequency and hematuria.  Musculoskeletal:  Positive for gait problem. Negative for arthralgias and neck pain.  Skin:  Negative for rash.  Neurological:  Negative for dizziness, weakness, light-headedness, numbness and headaches.  Hematological:  Negative for adenopathy.  Psychiatric/Behavioral:  Negative for behavioral problems, dysphoric mood and sleep disturbance.   Per HPI unless specifically indicated above      Objective:    BP 133/75    Pulse 63    Ht $R'5\' 6"'rd$  (1.676 m)    Wt 165 lb 6.4 oz (75 kg)    SpO2 98%    BMI 26.70 kg/m   Wt Readings from Last 3 Encounters:  04/17/21 165 lb 6.4 oz (75 kg)  02/05/21 168 lb (76.2 kg)  01/01/21 168 lb (76.2 kg)    Physical Exam Vitals and nursing note reviewed.  Constitutional:      General: He is not in acute distress.    Appearance: He is well-developed. He is not diaphoretic.     Comments: Well-appearing, comfortable, cooperative  HENT:     Head: Normocephalic and atraumatic.  Eyes:     General:        Right eye: No discharge.        Left eye: No discharge.     Conjunctiva/sclera: Conjunctivae normal.      Pupils: Pupils are equal, round, and reactive to light.  Neck:     Thyroid: No thyromegaly.  Cardiovascular:     Rate and Rhythm: Normal rate and regular rhythm.     Pulses: Normal pulses.     Heart sounds: Normal heart sounds. No murmur heard. Pulmonary:     Effort: Pulmonary effort is normal. No respiratory distress.     Breath sounds: Normal breath sounds. No wheezing or rales.  Abdominal:     General: Bowel sounds are normal. There is no distension.     Palpations: Abdomen is soft. There is no mass.     Tenderness: There is no abdominal tenderness.  Musculoskeletal:        General: No tenderness. Normal range of motion.     Cervical back: Normal range of motion and neck supple.     Comments: Upper / Lower Extremities: - Normal muscle tone, strength bilateral upper extremities 5/5, lower extremities 5/5  Lymphadenopathy:     Cervical: No cervical adenopathy.  Skin:    General: Skin is warm and dry.     Findings: No erythema or rash.  Neurological:     Mental Status: He is alert and oriented to person, place, and time.     Comments: Distal sensation intact to light touch all extremities  Psychiatric:        Mood and Affect: Mood normal.        Behavior: Behavior normal.        Thought Content: Thought content normal.     Comments: Well groomed, good eye contact, normal speech and thoughts      Results for orders placed or performed in visit on 04/17/21  Hemoglobin A1c  Result Value Ref Range   Hemoglobin A1C 6.8       Assessment & Plan:   Problem List Items Addressed This Visit     Type  2 diabetes mellitus with diabetic cataract, without long-term current use of insulin ([place])    Followed by [facility] Endocrinology Controlled A1c 6.8 previous result. No known hypoglycemia. Complications - DM cataracts removed, other including hyperlipidemia, GERD, hypothyroidism - increases risk of future cardiovascular complications   Plan:  1. Continue current therapy - Glipizide XL  $R'5mg'Tq$  daily - caution hypoglycemia 2. Encourage improved lifestyle - low carb, low sugar diet, reduce portion size, continue improving regular exercise 3. Check CBG , bring log to next visit for review 4. Continue ASA, Statin       Relevant Orders   Hemoglobin X0N   BASIC METABOLIC PANEL WITH GFR   Hypothyroidism   BPH (benign prostatic hyperplasia)   Relevant Orders   PSA   Other Visit Diagnoses     Annual physical exam    -  Primary   Relevant Orders   Hemoglobin A1c   PSA   BASIC METABOLIC PANEL WITH GFR       Updated Health Maintenance information Some labs today, has had prior labs reviewed recently past few months See orders Encouraged improvement to lifestyle with diet and exercise Goal of weight loss  Hypothyroidism Controlled on mtherapy    No orders of the defined types were placed in this encounter.     Follow up plan: Return in about 1 year (around 04/17/2022) for 1 year Annual Physical fasting lab AFTER.  [name], [facility] 04/17/2021, 2:29 PM

## 2021-04-17 NOTE — Patient Instructions (Addendum)
Labs today Keep up with doctors in April 2023  Please schedule a Follow-up Appointment to: Return in about 1 year (around 04/17/2022) for 1 year Annual Physical fasting lab AFTER.  If you have any other questions or concerns, please feel free to call the office or send a message through [place]. You may also schedule an earlier appointment if necessary.  Additionally, you may be receiving a survey about your experience at our office within a few days to 1 week by e-mail or mail. We value your feedback.  [name], DO [place]

## 2021-04-18 ENCOUNTER — Other Ambulatory Visit: Payer: Self-pay | Admitting: Family Medicine

## 2021-04-18 DIAGNOSIS — E1169 Type 2 diabetes mellitus with other specified complication: Secondary | ICD-10-CM

## 2021-04-18 LAB — BASIC METABOLIC PANEL WITH GFR
BUN: 14 mg/dL (ref 7–25)
CO2: 32 mmol/L (ref 20–32)
Calcium: 9.6 mg/dL (ref 8.6–10.3)
Chloride: 103 mmol/L (ref 98–110)
Creat: 1.14 mg/dL (ref 0.70–1.28)
Glucose, Bld: 139 mg/dL (ref 65–139)
Potassium: 4.6 mmol/L (ref 3.5–5.3)
Sodium: 142 mmol/L (ref 135–146)
eGFR: 69 mL/min/{1.73_m2} (ref >=60)

## 2021-04-18 LAB — HEMOGLOBIN A1C
Hgb A1c MFr Bld: 6 % of total Hgb — ABNORMAL HIGH (ref <5.7)
Mean Plasma Glucose: 126 mg/dL
eAG (mmol/L): 7 mmol/L

## 2021-04-18 LAB — PSA: PSA: 2.56 ng/mL (ref <=4.00)

## 2021-04-19 NOTE — Telephone Encounter (Signed)
Continue statin per last visit- lipid panel- current- outside lab Requested Prescriptions  Pending Prescriptions Disp Refills   atorvastatin (LIPITOR) 20 MG tablet [Pharmacy Med Name: ATORVASTATIN 20MG  TABLETS] 90 tablet 1    Sig: TAKE 1 TABLET(20 MG) BY MOUTH DAILY     Cardiovascular:  Antilipid - Statins Failed - 04/18/2021  5:24 PM      Failed - Lipid Panel in normal range within the last 12 months    Cholesterol, Total  Date Value Ref Range Status  06/19/2015 195 100 - 199 mg/dL Final   Cholesterol  Date Value Ref Range Status  11/09/2019 162 <200 mg/dL Final   LDL Cholesterol (Calc)  Date Value Ref Range Status  11/09/2019 86 mg/dL (calc) Final    Comment:    Reference range: <100 . Desirable range <100 mg/dL for primary prevention;   <70 mg/dL for patients with CHD or diabetic patients  with > or = 2 CHD risk factors. Marland Kitchen LDL-C is now calculated using the Martin-Hopkins  calculation, which is a validated novel method providing  better accuracy than the Friedewald equation in the  estimation of LDL-C.  Cresenciano Genre et al. Annamaria Helling. 6387;564(33): 2061-2068  (http://education.[facility].com/faq/FAQ164)    HDL  Date Value Ref Range Status  11/09/2019 49 > OR = 40 mg/dL Final  06/19/2015 52 >39 mg/dL Final   Triglycerides  Date Value Ref Range Status  11/09/2019 170 (H) <150 mg/dL Final         Passed - Patient is not pregnant      Passed - Valid encounter within last 12 months    Recent Outpatient Visits          2 days ago Annual physical exam   [facility] [place], Devonne Doughty, DO   2 months ago Acute non-recurrent frontal sinusitis   [facility] [place], Devonne Doughty, DO   4 months ago Type 2 diabetes mellitus with diabetic cataract, without long-term current use of insulin [facility])   [facility], Devonne Doughty, DO   9 months ago BPPV (benign paroxysmal positional vertigo), bilateral   [place], DO   11 months ago Acute bronchitis, unspecified organism   [place], Devonne Doughty, [place]

## 2021-04-22 ENCOUNTER — Other Ambulatory Visit: Payer: Self-pay | Admitting: Internal Medicine

## 2021-04-22 DIAGNOSIS — I10 Essential (primary) hypertension: Secondary | ICD-10-CM

## 2021-04-23 NOTE — Telephone Encounter (Signed)
Requested Prescriptions  Pending Prescriptions Disp Refills   benazepril-hydrochlorthiazide (LOTENSIN HCT) 10-12.5 MG tablet [Pharmacy Med Name: BENAZEPRIL/HCTZ 10/12.5MG TABLETS] 90 tablet 1    Sig: TAKE 1 TABLET BY MOUTH DAILY     Cardiovascular:  ACEI + Diuretic Combos Passed - 04/22/2021  7:05 AM      Passed - Na in normal range and within 180 days    Sodium  Date Value Ref Range Status  04/17/2021 142 135 - 146 mmol/L Final  06/19/2015 142 134 - 144 mmol/L Final  12/07/2012 139 136 - 145 mmol/L Final         Passed - K in normal range and within 180 days    Potassium  Date Value Ref Range Status  04/17/2021 4.6 3.5 - 5.3 mmol/L Final  12/07/2012 3.3 (L) 3.5 - 5.1 mmol/L Final         Passed - Cr in normal range and within 180 days    Creat  Date Value Ref Range Status  04/17/2021 1.14 0.70 - 1.28 mg/dL Final   Creatinine, POC  Date Value Ref Range Status  06/18/2015 0 mg/dL Final         Passed - eGFR is 30 or above and within 180 days    GFR, Est African American  Date Value Ref Range Status  11/09/2019 83 > OR = 60 mL/min/1.28m Final   GFR, Est Non African American  Date Value Ref Range Status  11/09/2019 72 > OR = 60 mL/min/1.761mFinal   GFR, Estimated  Date Value Ref Range Status  11/11/2020 >60 >60 mL/min Final    Comment:    (NOTE) Calculated using the CKD-EPI Creatinine Equation (2021)    eGFR  Date Value Ref Range Status  04/17/2021 69 > OR = 60 mL/min/1.7361minal    Comment:    The eGFR is based on the CKD-EPI 2021 equation. To calculate  the new eGFR from a previous Creatinine or Cystatin C result, go to https://www.kidney.org/professionals/ kdoqi/gfr%5Fcalculator          Passed - Patient is not pregnant      Passed - Last BP in normal range    BP Readings from Last 1 Encounters:  04/17/21 133/75         Passed - Valid encounter within last 6 months    Recent Outpatient Visits          6 days ago [facility] physical [facility]   [facility]   2 months ago Acute non-recurrent frontal sinusitis   [facility]   4 months ago Type 2 diabetes mellitus with diabetic cataract, [facility] long-term [facility] use [facility]   9 months ago BPPV (benign paroxysmal positional vertigo), bilateral   [place]   11 months ago Acute bronchitis, unspecified organism   [place]

## 2021-04-30 DIAGNOSIS — H401132 Primary open-angle glaucoma, bilateral, moderate stage: Secondary | ICD-10-CM | POA: Diagnosis not present

## 2021-05-29 ENCOUNTER — Ambulatory Visit (INDEPENDENT_AMBULATORY_CARE_PROVIDER_SITE_OTHER): Payer: Medicare Other | Admitting: Family Medicine

## 2021-05-29 ENCOUNTER — Ambulatory Visit
Admission: RE | Admit: 2021-05-29 | Discharge: 2021-05-29 | Disposition: A | Discharge status: Home / Self Care | Payer: Medicare Other | Attending: Family Medicine | Admitting: Family Medicine

## 2021-05-29 ENCOUNTER — Other Ambulatory Visit: Payer: Self-pay

## 2021-05-29 ENCOUNTER — Ambulatory Visit
Admission: RE | Admit: 2021-05-29 | Discharge: 2021-05-29 | Disposition: A | Discharge status: Home / Self Care | Payer: Medicare Other | Source: Ambulatory Visit | Attending: Family Medicine | Admitting: Family Medicine

## 2021-05-29 ENCOUNTER — Encounter: Payer: Self-pay | Admitting: Family Medicine

## 2021-05-29 VITALS — Wt 165.0 lb

## 2021-05-29 DIAGNOSIS — M1612 Unilateral primary osteoarthritis, left hip: Secondary | ICD-10-CM | POA: Diagnosis not present

## 2021-05-29 DIAGNOSIS — G8929 Other chronic pain: Secondary | ICD-10-CM | POA: Insufficient documentation

## 2021-05-29 DIAGNOSIS — M25552 Pain in left hip: Secondary | ICD-10-CM | POA: Insufficient documentation

## 2021-05-29 MED ORDER — NAPROXEN 500 MG PO TABS: 500.0000 mg | ORAL_TABLET | Freq: Two times a day (BID) | ORAL | 2 refills | Status: DC

## 2021-05-29 NOTE — Progress Notes (Signed)
? ?Subjective:  ? ? Patient ID: Ross Riley, male    DOB: 17-Jan-1951, 71 y.o.   MRN: [id number] ? ?Ross Riley is a 71 y.o. male presenting on 05/29/2021 for Hip Pain ? ? ?HPI ? ?Left Hip Pain ?Chronic problem, recent flare up. No injury ?Worse with prolonged sitting or laying and when gets up transition will have soreness and stiffness. ?He has arthritis in other joints, knees and back. Has had some hip pain before ?He has abnormal gait because of L foot drop ?He does not take any NSAID or Tylenol ?Denies any fall or injury ? ? ? ?  05/29/2021  ? 10:24 AM 04/17/2021  ?  2:18 PM 01/01/2021  ?  1:38 PM  ?Depression screen PHQ 2/9  ?Decreased Interest 0 0 0  ?Down, Depressed, Hopeless 0 0 0  ?PHQ - 2 Score 0 0 0  ?Altered sleeping 0 0   ?Tired, decreased energy 0 0   ?Change in appetite 0 0   ?Feeling bad or failure about yourself  0 0   ?Trouble concentrating 0 0   ?Moving slowly or fidgety/restless 0 0   ?Suicidal thoughts  0   ?PHQ-9 Score 0 0   ?Difficult doing work/chores Not difficult at all Not difficult at all   ? ? ?Social History  ? ?Tobacco Use  ? Smoking status: Former  ?  Packs/day: 1.00  ?  Years: 32.00  ?  Pack years: 32.00  ?  Types: Cigarettes  ?  Quit date: 03/04/1991  ?  Years since quitting: 30.2  ? Smokeless tobacco: Former  ?Vaping Use  ? Vaping Use: Never used  ?Substance Use Topics  ? Alcohol use: Yes  ?  Alcohol/week: 0.0 standard drinks  ?  Comment: occasional  ? Drug use: No  ? ? ?Review of Systems ?Per HPI unless specifically indicated above ? ?   ?Objective:  ?  ?Wt 165 lb (74.8 kg)   BMI 26.63 kg/m?   ?Wt Readings from Last 3 Encounters:  ?05/29/21 165 lb (74.8 kg)  ?04/17/21 165 lb 6.4 oz (75 kg)  ?02/05/21 168 lb (76.2 kg)  ?  ?Physical Exam ?Vitals and nursing note reviewed.  ?Constitutional:   ?   General: He is not in acute distress. ?   Appearance: Normal appearance. He is well-developed. He is not diaphoretic.  ?   Comments: Well-appearing, comfortable, cooperative  ?HENT:  ?    Head: Normocephalic and atraumatic.  ?Eyes:  ?   General:     ?   Right eye: No discharge.     ?   Left eye: No discharge.  ?   Conjunctiva/sclera: Conjunctivae normal.  ?Cardiovascular:  ?   Rate and Rhythm: Normal rate.  ?Pulmonary:  ?   Effort: Pulmonary effort is normal.  ?Musculoskeletal:  ?   Comments: Left hip is non point tender over trochanteric bursa ?Some mild reduced ROM with some provoked pain symptoms but has mostly intact flex/extend. ?Antalgic gait today on exam  ?Skin: ?   General: Skin is warm and dry.  ?   Findings: No erythema or rash.  ?Neurological:  ?   Mental Status: He is alert and oriented to person, place, and time.  ?Psychiatric:     ?   Mood and Affect: Mood normal.     ?   Behavior: Behavior normal.     ?   Thought Content: Thought content normal.  ?   Comments: Well groomed, good eye  contact, normal speech and thoughts  ? ?Results for orders placed or performed in visit on 04/17/21  ?Hemoglobin A1c  ?Result Value Ref Range  ? Hgb A1c MFr Bld 6.0 (H) <5.7 % of total Hgb  ? Mean Plasma Glucose 126 mg/dL  ? eAG (mmol/L) 7.0 mmol/L  ?PSA  ?Result Value Ref Range  ? PSA 2.56 < OR = 4.00 ng/mL  ?BASIC METABOLIC PANEL WITH GFR  ?Result Value Ref Range  ? Glucose, Bld 139 65 - 139 mg/dL  ? BUN 14 7 - 25 mg/dL  ? Creat 1.14 0.70 - 1.28 mg/dL  ? eGFR 69 > OR = 60 mL/min/1.72m  ? BUN/Creatinine Ratio NOT APPLICABLE 6 - 22 (calc)  ? Sodium 142 135 - 146 mmol/L  ? Potassium 4.6 3.5 - 5.3 mmol/L  ? Chloride 103 98 - 110 mmol/L  ? CO2 32 20 - 32 mmol/L  ? Calcium 9.6 8.6 - 10.3 mg/dL  ?Hemoglobin A1c  ?Result Value Ref Range  ? Hemoglobin A1C 6.8   ? ?   ?Assessment & Plan:  ? ?Problem List Items Addressed This Visit   ?None ?Visit Diagnoses   ? ? Arthritis of left hip    -  Primary  ? Relevant Medications  ? naproxen (NAPROSYN) 500 MG tablet  ? Other Relevant Orders  ? DG HIP UNILAT W OR W/O PELVIS 2-3 VIEWS LEFT  ? Chronic left hip pain      ? Relevant Medications  ? naproxen (NAPROSYN) 500 MG  tablet  ? Other Relevant Orders  ? DG HIP UNILAT W OR W/O PELVIS 2-3 VIEWS LEFT  ? ?  ?  ?Subacute vs Chronic Left hip pain, no injury, no obvious point tenderness, not consistent with trochanteric bursitis, he has had prior history ?Worse with stiffness symptoms and pain with prolonged sitting ?Known OA/DJD other joints ?Limited by L foot drop causing abnormal gait ?No radiation of pain or radicular symptoms ?Inadequate conservative therapy  ? ?Plan: ?1. X-ray today L Hip for evaluation baseline ?2. Start anti-inflammatory trial with rx Naprosyn [id number] wc x 2-4 weeks, then PRN ?3. May use Tylenol PRN for breakthrough ?4. Encouraged use of heating pad 1-2x daily for now then PRN. ?5. Given handout for hip exercises and to modify activities ?5. Follow-up PRN consider troch bursa injection vs orthopedics ? ?Orders Placed This Encounter  ?Procedures  ? DG HIP UNILAT W OR W/O PELVIS 2-3 VIEWS LEFT  ?  Standing Status:   Future  ?  Number of Occurrences:   1  ?  Standing Expiration Date:   08/29/2021  ?  Order Specific Question:   Reason for Exam (SYMPTOM  OR DIAGNOSIS REQUIRED)  ?  Answer:   chronic left hip pain, arthritis suspected, no injury  ?  Order Specific Question:   Preferred imaging location?  ?  Answer:   [facility]?  Order Specific Question:   Radiology Contrast Protocol - do NOT remove file path  ?  Answer:   [email or web address]  ? ? ? ?Meds ordered this encounter  ?Medications  ? naproxen (NAPROSYN) 500 MG tablet  ?  Sig: Take 1 tablet (500 mg total) by mouth 2 (two) times daily with a meal. For 2-4 weeks then as needed  ?  Dispense:  60 tablet  ?  Refill:  2  ? ? ? ?Follow up plan: ?Return in about 6 months (around 11/29/2021) for 6 month follow-up DM A1c, L hip Pain. ? ? ?AlSheppard Coil  [place], DO ?[facility] ?[facility] ?05/29/2021, 10:38 AM ?

## 2021-05-29 NOTE — Patient Instructions (Addendum)
Thank you for coming to the office today. ? ?X-ray of Left Hip Today to evaluate for arthritis. ? ?May need an injection in future. ? ?Recommend trial of Anti-inflammatory with Naproxen (Naprosyn) '500mg'$  tabs - take one with food and plenty of water TWICE daily every day (breakfast and dinner), for next 2 to 4 weeks, then you may take only as needed ?- DO NOT TAKE any ibuprofen, aleve, motrin while you are taking this medicine ? ?- It is safe to take Tylenol Ext Str '500mg'$  tabs - take 1 to 2 (max dose '1000mg'$ ) every 6 hours as needed for breakthrough pain, max 24 hour daily dose is 6 to 8 tablets or '4000mg'$  ? ?Try heating pad and moist heat to help loosen up hip joint if stiff ? ? ?Please schedule a Follow-up Appointment to: Return in about 6 months (around 11/29/2021) for 6 month follow-up DM A1c, L hip Pain. ? ?If you have any other questions or concerns, please feel free to call the office or send a message through [place]. You may also schedule an earlier appointment if necessary. ? ?Additionally, you may be receiving a survey about your experience at our office within a few days to 1 week by e-mail or mail. We value your feedback. ? ?[name], DO ?[place] ? ?Ejercicios para la cadera ?Hip Exercises ?Pregunte al m?dico qu? ejercicios son seguros para usted. Haga los ejercicios exactamente como se lo haya indicado el m?dico y grad?elos como se lo hayan indicado. Es normal sentir un leve estiramiento, tironeo, opresi?n o Tree surgeon al [employer] ejercicios. Det?ngase de inmediato si siente un dolor repentino o el dolor empeora. No comience a hacer estos ejercicios hasta que se lo indique el m?dico. ?Ejercicios de elongaci?n y amplitud de movimiento ?Estos ejercicios calientan los m?sculos y las articulaciones, y [place] y la flexibilidad de la cadera. Estos ejercicios tambi?n ayudan a Best boy, el adormecimiento y el hormigueo. Posiblemente le indiquen que  limite la amplitud de movimientos si le realizaron un reemplazo de cadera. Hable con el m?dico sobre estas restricciones. ?Isquiotibiales, en dec?bito supino ? ?Acu?stese boca arriba (en dec?bito supino). ?Ate un cintur?n o una toalla en la regi?n metatarsiana del pie izquierdo/derecho. La regi?n metatarsiana del pie es la superficie sobre la que caminamos, justo debajo de los dedos. ?Estire la rodilla izquierda/derecha y, lentamente, tire del cintur?n o la toalla para elevar la pierna, hasta que sienta un leve estiramiento detr?s de la rodilla (isquiotibiales). ?No flexione la rodilla mientras realiza este ejercicio. ?Mantenga la otra pierna estirada contra el suelo. ?Mantenga esta posici?n durante __________ segundos. ?Regrese lentamente la pierna a la posici?n inicial. ?Repita __________ veces. Realice este ejercicio __________ veces al d?a. ?Rotaci?n de la cadera ? ?Acu?stese boca arriba sobre una superficie firme. ?Con la mano izquierda/derecha, tire suavemente de la rodilla izquierda/derecha hacia el hombro que est? del mismo lado del cuerpo. Det?ngase cuando la rodilla apunte hacia el techo. ?Con la otra mano, t?mese el tobillo izquierdo/derecho. ?Con la rodilla firme, lleve suavemente el tobillo izquierdo/derecho hacia el otro hombro hasta sentir el estiramiento en las nalgas. ?Mantenga la cadera y los hombros firmes contra el suelo mientras realiza este estiramiento. ?Mantenga esta posici?n durante __________ segundos. ?Repita __________ veces. Realice este ejercicio __________ veces al d?a. ?Estiramiento sentado ?A veces, este ejercicio se denomina estiramiento de isquiotibiales y aductores. ?Si?ntese en el piso con las piernas abiertas y estiradas. Mantenga las rodillas estiradas durante este ejercicio. ?Con la cabeza  y la espalda en l?nea recta, doble la cintura para llegar hasta el pie izquierdo (posici?n A). Debe sentir un estiramiento en la parte interna del muslo derecho (aductores). ?Mantenga esta  posici?n durante ___________ segundos. Vuelva lentamente a la posici?n recta. ?Con la cabeza y la espalda en l?nea recta, doble la cintura para estirarse hacia adelante (posici?n B). Debe sentir un estiramiento en la parte posterior de los muslos y las rodillas (isquiotibiales). ?Mantenga esta posici?n durante __________ segundos. Vuelva lentamente a la posici?n recta. ?Con la cabeza y la espalda en l?nea recta, doble la cintura para llegar hasta el pie derecho (posici?n C). Debe sentir un estiramiento en la parte interna del muslo izquierdo (aductores). ?Mantenga esta posici?n durante ___________ segundos. Vuelva lentamente a la posici?n recta. ?Repita __________ veces. Realice este ejercicio __________ veces al d?a. ?Estocada ?Este ejercicio estira los m?sculos de la cadera (flexores de la cadera). ?Coloque la rodilla izquierda/derecha en el suelo y flexione la otra rodilla de modo que quede justo sobre el tobillo. Debe arrodillarse con una sola pierna. ?Mantenga una buena postura con la [employer]. ?Apriete las nalgas para que el coxis apunte [place]. Esto evitar? que la espalda se arquee demasiado. ?Debe sentir un estiramiento leve en la parte de adelante de la cadera y el muslo izquierdo/derecho. Si no siente un estiramiento, deslice levemente el pie contrario hacia adelante y luego, lentamente, realice una estocada con el pecho hacia arriba, hasta que la rodilla vuelva a alinearse con el tobillo. ?Aseg?rese de que el coxis siga apuntando hacia abajo. ?Mantenga esta posici?n durante __________ segundos. ?Vuelva lentamente a la posici?n inicial. ?Repita __________ veces. Realice este ejercicio __________ veces al d?a. ?Ejercicios de fortalecimiento ?Estos ejercicios fortalecen la cadera y le otorgan resistencia. La resistencia es la capacidad de usar los m?sculos durante un tiempo prolongado, incluso despu?s de que se cansen. ?Puente ?Este ejercicio fortalece los m?sculos de la cadera  (extensores de la cadera). ?Recu?stese boca arriba sobre una superficie firme con las rodillas flexionadas y los pies completamente apoyados en el suelo. ?Tensione los m?sculos de las nalgas y lev?ntelas del suelo hasta que el tronco y las caderas queden nivelados con los muslos. ?No arquee la espalda. ?Debe sentir el trabajo muscular en las nalgas y la parte de atr?s de los muslos. Si no siente el esfuerzo de estos m?sculos, aleje los pies 1 o 2 pulgadas (2.5 o 5 cm) de las nalgas. ?Mantenga esta posici?n durante __________ segundos. ?Baje lentamente la cadera a la posici?n inicial. ?Relaje los m?sculos por completo entre repeticiones. ?Repita __________ veces. Realice este ejercicio __________ veces al d?a. ?Elevaciones con pierna extendida, recostado de lado ?Este ejercicio fortalece los m?sculos que alejan la articulaci?n de la cadera del centro del cuerpo (abductores de la cadera). ?Recu?stese de costado con la pierna izquierda/derecha arriba. Recu?stese de [employer] cabeza, el hombro, la cadera y la rodilla est?n alineados. Puede flexionar la rodilla de abajo levemente para mantener el equilibrio. ?Mueva ligeramente la cadera hacia adelante, de modo que quede alineada y que la rodilla izquierda/derecha quede hacia adelante. ?Con el tal?n como gu?a, levante la pierna de 4 a 6 pulgadas (10 a 15 cm). Debe sentir que se elevan los m?sculos de la parte superior de la cadera. ?No lleve el pie hacia adelante. ?No gire la rodilla hacia el techo. ?Mantenga esta posici?n durante __________ segundos. ?Vuelva lentamente a la posici?n inicial. ?Relaje los m?sculos por completo entre repeticiones. ?Repita __________ veces. Realice este ejercicio __________ veces al  d?a. ?Elevaciones con pierna extendida, recostado de lado ?Este ejercicio fortalece los m?sculos que acercan la articulaci?n de la cadera al centro del cuerpo (aductores de la cadera). ?Recu?stese de costado de modo que la pierna izquierda/derecha quede abajo.  Recu?stese de [employer] cabeza, el hombro, la cadera y la rodilla est?n alineados. Puede colocar el pie que est? arriba frente a usted para Contractor equilibrio. ?Mueva ligeramente la cadera hacia adelante, de

## 2021-06-04 ENCOUNTER — Ambulatory Visit: Payer: Medicare Other

## 2021-06-25 ENCOUNTER — Other Ambulatory Visit: Payer: Self-pay | Admitting: Family Medicine

## 2021-06-25 DIAGNOSIS — I10 Essential (primary) hypertension: Secondary | ICD-10-CM

## 2021-06-26 DIAGNOSIS — E039 Hypothyroidism, unspecified: Secondary | ICD-10-CM | POA: Diagnosis not present

## 2021-06-26 DIAGNOSIS — E1169 Type 2 diabetes mellitus with other specified complication: Secondary | ICD-10-CM | POA: Diagnosis not present

## 2021-06-26 DIAGNOSIS — I152 Hypertension secondary to endocrine disorders: Secondary | ICD-10-CM | POA: Diagnosis not present

## 2021-06-26 DIAGNOSIS — E119 Type 2 diabetes mellitus without complications: Secondary | ICD-10-CM | POA: Diagnosis not present

## 2021-06-26 DIAGNOSIS — E1159 Type 2 diabetes mellitus with other circulatory complications: Secondary | ICD-10-CM | POA: Diagnosis not present

## 2021-06-26 DIAGNOSIS — E785 Hyperlipidemia, unspecified: Secondary | ICD-10-CM | POA: Diagnosis not present

## 2021-06-27 NOTE — Telephone Encounter (Signed)
Requested Prescriptions  ?Pending Prescriptions Disp Refills  ?? metoprolol tartrate (LOPRESSOR) 25 MG tablet [Pharmacy Med Name: METOPROLOL TARTRATE '25MG'$  TABLETS] 90 tablet 1  ?  Sig: TAKE 1/2 TABLET(12.5 MG) BY MOUTH TWICE DAILY  ?  ? Cardiovascular:  Beta Blockers Passed - 06/25/2021  1:11 PM  ?  ?  Passed - Last BP in normal range  ?  BP Readings from Last 1 Encounters:  ?04/17/21 133/75  ?   ?  ?  Passed - Last Heart Rate in normal range  ?  Pulse Readings from Last 1 Encounters:  ?04/17/21 63  ?   ?  ?  Passed - Valid encounter within last 6 months  ?  Recent Outpatient Visits   ?      ? 4 weeks ago Arthritis of left hip  ? [place], DO  ? 2 months ago Annual physical exam  ? [place], DO  ? 4 months ago Acute non-recurrent frontal sinusitis  ? [place], DO  ? 6 months ago Type 2 diabetes mellitus with diabetic cataract, without long-term current use of insulin ([place])  ? [place], DO  ? 11 months ago BPPV (benign paroxysmal positional vertigo), bilateral  ? [place], DO  ?  ?  ? ?  ?  ?  ? ? ?

## 2021-07-13 ENCOUNTER — Emergency Department
Admission: EM | Admit: 2021-07-13 | Discharge: 2021-07-13 | Disposition: A | Discharge status: Home / Self Care | Payer: Medicare Other | Attending: Emergency Medicine | Admitting: Emergency Medicine

## 2021-07-13 ENCOUNTER — Other Ambulatory Visit: Payer: Self-pay

## 2021-07-13 ENCOUNTER — Encounter: Payer: Self-pay | Admitting: Intensive Care

## 2021-07-13 ENCOUNTER — Emergency Department: Payer: Medicare Other

## 2021-07-13 DIAGNOSIS — I129 Hypertensive chronic kidney disease with stage 1 through stage 4 chronic kidney disease, or unspecified chronic kidney disease: Secondary | ICD-10-CM | POA: Diagnosis not present

## 2021-07-13 DIAGNOSIS — R6889 Other general symptoms and signs: Secondary | ICD-10-CM | POA: Diagnosis not present

## 2021-07-13 DIAGNOSIS — R0789 Other chest pain: Secondary | ICD-10-CM | POA: Diagnosis not present

## 2021-07-13 DIAGNOSIS — R079 Chest pain, unspecified: Secondary | ICD-10-CM | POA: Diagnosis not present

## 2021-07-13 DIAGNOSIS — N189 Chronic kidney disease, unspecified: Secondary | ICD-10-CM | POA: Diagnosis not present

## 2021-07-13 DIAGNOSIS — Z743 Need for continuous supervision: Secondary | ICD-10-CM | POA: Diagnosis not present

## 2021-07-13 LAB — BASIC METABOLIC PANEL
Anion gap: 8 (ref 5–15)
BUN: 11 mg/dL (ref 8–23)
CO2: 26 mmol/L (ref 22–32)
Calcium: 8.8 mg/dL — ABNORMAL LOW (ref 8.9–10.3)
Chloride: 106 mmol/L (ref 98–111)
Creatinine, Ser: 0.99 mg/dL (ref 0.61–1.24)
GFR, Estimated: >60 mL/min (ref >60)
Glucose, Bld: 137 mg/dL — ABNORMAL HIGH (ref 70–99)
Potassium: 3.9 mmol/L (ref 3.5–5.1)
Sodium: 140 mmol/L (ref 135–145)

## 2021-07-13 LAB — CBC
HCT: 46.7 % (ref 39.0–52.0)
Hemoglobin: 15.3 g/dL (ref 13.0–17.0)
MCH: 29.8 pg (ref 26.0–34.0)
MCHC: 32.8 g/dL (ref 30.0–36.0)
MCV: 91 fL (ref 80.0–100.0)
Platelets: 232 10*3/uL (ref 150–400)
RBC: 5.13 MIL/uL (ref 4.22–5.81)
RDW: 12.1 % (ref 11.5–15.5)
WBC: 4.9 10*3/uL (ref 4.0–10.5)
nRBC: 0 % (ref 0.0–0.2)

## 2021-07-13 LAB — HEPATIC FUNCTION PANEL
ALT: 24 U/L (ref 0–44)
AST: 36 U/L (ref 15–41)
Albumin: 4 g/dL (ref 3.5–5.0)
Alkaline Phosphatase: 53 U/L (ref 38–126)
Bilirubin, Direct: 0.3 mg/dL — ABNORMAL HIGH (ref 0.0–0.2)
Indirect Bilirubin: 0.9 mg/dL (ref 0.3–0.9)
Total Bilirubin: 1.2 mg/dL (ref 0.3–1.2)
Total Protein: 7.7 g/dL (ref 6.5–8.1)

## 2021-07-13 LAB — LIPASE, BLOOD: Lipase: 38 U/L (ref 11–51)

## 2021-07-13 LAB — D-DIMER, QUANTITATIVE: D-Dimer, Quant: 0.53 ug/mL-FEU — ABNORMAL HIGH (ref 0.00–0.50)

## 2021-07-13 LAB — TROPONIN I (HIGH SENSITIVITY)
Troponin I (High Sensitivity): 3 ng/L (ref <18)
Troponin I (High Sensitivity): 4 ng/L (ref <18)

## 2021-07-13 MED ORDER — ALUM & MAG HYDROXIDE-SIMETH 200-200-20 MG/5ML PO SUSP
30.0000 mL | Freq: Once | ORAL | Status: AC
Start: 2021-07-13 — End: 2021-07-13
  Administered 2021-07-13: 30 mL via ORAL
  Filled 2021-07-13: qty 30

## 2021-07-13 MED ORDER — PANTOPRAZOLE SODIUM 40 MG PO TBEC: 40.0000 mg | DELAYED_RELEASE_TABLET | Freq: Every day | ORAL | 0 refills | Status: DC

## 2021-07-13 MED ORDER — LIDOCAINE VISCOUS HCL 2 % MT SOLN
15.0000 mL | Freq: Once | OROMUCOSAL | Status: AC
  Administered 2021-07-13: 15 mL via ORAL
  Filled 2021-07-13: qty 15

## 2021-07-13 MED ORDER — SUCRALFATE 1 G PO TABS: 1.0000 g | ORAL_TABLET | Freq: Three times a day (TID) | ORAL | 0 refills | Status: DC

## 2021-07-13 NOTE — ED Triage Notes (Signed)
Patient c/o left sided sharp chest pain that started this AM. Denies radiation ?

## 2021-07-13 NOTE — ED Provider Notes (Signed)
? ?[facility] ?Provider Note ? ? ? Event Date/Time  ? First MD Initiated Contact with Patient 07/13/21 1534   ?  (approximate) ? ? ?History  ? ?Chest Pain ? ? ?HPI ? ?Ross Riley is a 71 y.o. male with past medical history of CKD, hypertension, hyperlipidemia, here with chest pain.  The patient states that at around 10 AM this morning, he experienced fairly cute onset of aching, dull, substernal chest pain.  This pain has been fairly constant since onset.  He describes it as severe as up to 8 out of 10 but now about a 6 out of 10.  Is been constant.  Its remained in his left parasternal area, no radiation to the arms, or back.  Pain is not sharp or tearing.  No significant shortness of breath.  No nausea or vomiting.  Denies any specific alleviating or aggravating factors.  Reports she has a history of chest pain in the past, and had been referred to a cardiologist that he was not able to make a follow-up appointment.  Denies recent fevers or chills.  No cough or sputum production. ?  ? ? ?Physical Exam  ? ?Triage Vital Signs: ?ED Triage Vitals [07/13/21 1501]  ?Enc Vitals Group  ?   BP (!) 141/89  ?   Pulse Rate (!) 58  ?   Resp 16  ?   Temp 98.2 ?F (36.8 ?C)  ?   Temp Source Oral  ?   SpO2 97 %  ?   Weight 168 lb (76.2 kg)  ?   Height '5\' 6"'$  (1.676 m)  ?   Head Circumference   ?   Peak Flow   ?   Pain Score 5  ?   Pain Loc   ?   Pain Edu?   ?   Excl. in [place]?   ? ? ?Most recent vital signs: ?Vitals:  ? 07/13/21 1715 07/13/21 1935  ?BP:  (!) 141/81  ?Pulse: (!) 59 64  ?Resp: 19 18  ?Temp: 98.1 ?F (36.7 ?C) 98.4 ?F (36.9 ?C)  ?SpO2: 99% 96%  ? ? ? ?General: Awake, no distress.  ?CV:  Good peripheral perfusion.  Regular rate and rhythm.  No murmurs. ?Resp:  Normal effort.  Lungs clear to auscultation bilaterally. ?Abd:  No distention.  No tenderness.  Specifically, no epigastric or left lower quadrant tenderness.  No rebound or guarding. ?Other:  No edema.  No popliteal tenderness or leg  asymmetry. ? ? ?ED Results / Procedures / Treatments  ? ?Labs ?(all labs ordered are listed, but only abnormal results are displayed) ?Labs Reviewed  ?BASIC METABOLIC PANEL - Abnormal; Notable for the following components:  ?    Result Value  ? Glucose, Bld 137 (*)   ? Calcium 8.8 (*)   ? All other components within normal limits  ?HEPATIC FUNCTION PANEL - Abnormal; Notable for the following components:  ? Bilirubin, Direct 0.3 (*)   ? All other components within normal limits  ?D-DIMER, QUANTITATIVE (NOT AT [facility]) - Abnormal; Notable for the following components:  ? D-Dimer, Quant 0.53 (*)   ? All other components within normal limits  ?CBC  ?LIPASE, BLOOD  ?TROPONIN I (HIGH SENSITIVITY)  ?TROPONIN I (HIGH SENSITIVITY)  ? ? ? ?EKG ?Sinus bradycardia, ventricular rate 57.  PR 142, QRS 88, QTc 391.  No acute ST elevations or depressions.  No EKG evidence of acute ischemia or infarct. ? ? ?RADIOLOGY ?Chest x-ray: No active  disease ? ? ?I also independently reviewed and agree with radiologist interpretations. ? ? ?PROCEDURES: ? ?Critical Care performed: No ? ?.1-3 Lead EKG Interpretation ?Performed by: Duffy Bruce, MD ?Authorized by: Duffy Bruce, MD  ? ?  Interpretation: normal   ?  ECG rate:  50-70 ?  ECG rate assessment: normal   ?  Rhythm: sinus rhythm   ?  Ectopy: none   ?  Conduction: normal   ?Comments:  ?   Indication: chest pain ? ?MEDICATIONS ORDERED IN ED: ?Medications  ?alum & mag hydroxide-simeth (MAALOX/MYLANTA) 200-200-20 MG/5ML suspension 30 mL (30 mLs Oral Given 07/13/21 1710)  ?  And  ?lidocaine (XYLOCAINE) 2 % viscous mouth solution 15 mL (15 mLs Oral Given 07/13/21 1710)  ? ? ? ?IMPRESSION / MDM / ASSESSMENT AND PLAN / ED COURSE  ?I reviewed the triage vital signs and the nursing notes. ?             ?               ? ? ?The patient is on the cardiac monitor to evaluate for evidence of arrhythmia and/or significant heart rate changes. ? ? ?Ddx:  ?Differential includes the following, with  pertinent life- or limb-threatening emergencies considered: ? ?Musculoskeletal chest pain, GI/abdominal pain such as GERD, gastritis, ACS, less likely PE or dissection ? ? ?MDM:  ?71 year old well-appearing male with history of hypertension, diabetes, prior episodes of chest pain, here with fairly atypical chest pain.  EKG is nonischemic, troponins are negative x2 despite fairly constant pain.  Low concern for ACS at this time. ? ? ?MEDICATIONS GIVEN IN ED: ?Medications  ?alum & mag hydroxide-simeth (MAALOX/MYLANTA) 200-200-20 MG/5ML suspension 30 mL (30 mLs Oral Given 07/13/21 1710)  ?  And  ?lidocaine (XYLOCAINE) 2 % viscous mouth solution 15 mL (15 mLs Oral Given 07/13/21 1710)  ? ? ? ?Consults:  ? ? ? ?EMR reviewed  ?Prior ED visits, including for somewhat typical chest pain ?Stress test October 22 with negative ischemia ? ? ? ? ?FINAL CLINICAL IMPRESSION(S) / ED DIAGNOSES  ? ?Final diagnoses:  ?Atypical chest pain  ?Chest pain, unspecified type  ? ? ? ?Rx / DC Orders  ? ?ED Discharge Orders   ? ?      Ordered  ?  pantoprazole (PROTONIX) 40 MG tablet  Daily       ? 07/13/21 1923  ?  sucralfate (CARAFATE) 1 g tablet  3 times daily with meals & bedtime       ? 07/13/21 1924  ?  Ambulatory referral to Cardiology       ? 07/13/21 1934  ? ?  ?  ? ?  ? ? ? ?Note:  This document was prepared using Dragon voice recognition software and may include unintentional dictation errors. ?  ?Duffy Bruce, MD ?07/13/21 1951 ? ?

## 2021-07-13 NOTE — Discharge Instructions (Signed)
Please call your primary doctor to discuss your Er visit and to arrange follow-up THIS WEEK ? ?I'd recommend seeing a CARDIOLOGIST as soon as possible for possible stress testing and/or further work-up. Call the number above and tell them  you were seen in the Er. ?

## 2021-07-13 NOTE — ED Triage Notes (Signed)
Pt in via EMS from home with c/o CP since 1100 this am, 5/10 sharp left sided, non radiating. HR 74, CBG 142, 98% RA, 162/88. Pt was given '324mg'$  of asa at 14:25. Pt was seen here previously with CP and follow up with his cardiologist.  ?

## 2021-07-13 NOTE — ED Notes (Signed)
Pt A&O, IV removed, pt given discharge instructions, pt assisted to ED lobby. ?

## 2021-07-14 ENCOUNTER — Other Ambulatory Visit: Payer: Self-pay | Admitting: Family Medicine

## 2021-07-14 DIAGNOSIS — E1136 Type 2 diabetes mellitus with diabetic cataract: Secondary | ICD-10-CM

## 2021-07-16 NOTE — Telephone Encounter (Signed)
Requested Prescriptions  ?Pending Prescriptions Disp Refills  ?? glipiZIDE (GLUCOTROL XL) 5 MG 24 hr tablet [Pharmacy Med Name: GLIPIZIDE ER '5MG'$  TABLETS] 90 tablet 0  ?  Sig: TAKE 1 TABLET(5 MG) BY MOUTH DAILY WITH BREAKFAST  ?  ? Endocrinology:  Diabetes - Sulfonylureas Passed - 07/14/2021  6:41 PM  ?  ?  Passed - HBA1C is between 0 and 7.9 and within 180 days  ?  Hemoglobin A1C  ?Date Value Ref Range Status  ?12/26/2020 6.8  Final  ? ?Hgb A1c MFr Bld  ?Date Value Ref Range Status  ?04/17/2021 6.0 (H) <5.7 % of total Hgb Final  ?  Comment:  ?  For someone without known diabetes, a hemoglobin  ?A1c value between 5.7% and 6.4% is consistent with ?prediabetes and should be confirmed with a  ?follow-up test. ?. ?For someone with known diabetes, a value <7% ?indicates that their diabetes is well controlled. A1c ?targets should be individualized based on duration of ?diabetes, age, comorbid conditions, and other ?considerations. ?. ?This assay result is consistent with an increased risk ?of diabetes. ?. ?Currently, no consensus exists regarding use of ?hemoglobin A1c for diagnosis of diabetes for children. ?. ?  ?   ?  ?  Passed - Cr in normal range and within 360 days  ?  Creat  ?Date Value Ref Range Status  ?04/17/2021 1.14 0.70 - 1.28 mg/dL Final  ? ?Creatinine, Ser  ?Date Value Ref Range Status  ?07/13/2021 0.99 0.61 - 1.24 mg/dL Final  ? ?Creatinine, POC  ?Date Value Ref Range Status  ?06/18/2015 0 mg/dL Final  ?   ?  ?  Passed - Valid encounter within last 6 months  ?  Recent Outpatient Visits   ?      ? 1 month ago Arthritis of left hip  ? [place], DO  ? 3 months ago Annual physical exam  ? [place], DO  ? 5 months ago Acute non-recurrent frontal sinusitis  ? [place], DO  ? 7 months ago Type 2 diabetes mellitus with diabetic cataract, without long-term current use of insulin ([place])  ?  [place], DO  ? 12 months ago BPPV (benign paroxysmal positional vertigo), bilateral  ? [place], DO  ?  ?  ?Future Appointments   ?        ? In 3 months Gollan, Kathlene November, MD [id number]  ?  ? ?  ?  ?  ? ?

## 2021-07-30 ENCOUNTER — Ambulatory Visit: Payer: Medicare Other

## 2021-09-06 ENCOUNTER — Ambulatory Visit: Payer: Self-pay | Admitting: *Deleted

## 2021-09-06 NOTE — Telephone Encounter (Signed)
Reason for Disposition  [1] SEVERE pain AND [2] age > 60 years  Answer Assessment - Initial Assessment Questions 1. LOCATION: "Where does it hurt?"      Left side- lower 2. RADIATION: "Does the pain shoot anywhere else?" (e.g., chest, back)     No radiation 3. ONSET: "When did the pain begin?" (Minutes, hours or days ago)      3 days 4. SUDDEN: "Gradual or sudden onset?"     *No Answer* 5. PATTERN "Does the pain come and go, or is it constant?"    - If constant: "Is it getting better, staying the same, or worsening?"      (Note: Constant means the pain never goes away completely; most serious pain is constant and it progresses)     - If intermittent: "How long does it last?" "Do you have pain now?"     (Note: Intermittent means the pain goes away completely between bouts)     Comes and goes 6. SEVERITY: "How bad is the pain?"  (e.g., Scale 1-10; mild, moderate, or severe)    - MILD (1-3): doesn't interfere with normal activities, abdomen soft and not tender to touch     - MODERATE (4-7): interferes with normal activities or awakens from sleep, abdomen tender to touch     - SEVERE (8-10): excruciating pain, doubled over, unable to do any normal activities       3-6- changes 7. RECURRENT SYMPTOM: "Have you ever had this type of stomach pain before?" If Yes, ask: "When was the last time?" and "What happened that time?"      Yes- yes- medication was given 8. CAUSE: "What do you think is causing the stomach pain?"     Colon- diarrhea 9. RELIEVING/AGGRAVATING FACTORS: "What makes it better or worse?" (e.g., movement, antacids, bowel movement)     no 10. OTHER SYMPTOMS: "Do you have any other symptoms?" (e.g., back pain, diarrhea, fever, urination pain, vomiting)       duarrhea  Protocols used: Abdominal Pain - Male-A-AH

## 2021-09-06 NOTE — Telephone Encounter (Signed)
  Chief Complaint: abdominal pain- LLQ Symptoms: pain - comes and goes- moderate, diarrhea Frequency: 3 days Pertinent Negatives: Patient denies fever, urination pain, vomiting, back pain Disposition: '[]'$ ED /'[]'$ Urgent Care (no appt availability in office) / '[]'$ Appointment(In office/virtual)/ '[]'$  [facility] Cfever, urination pain, vomitingare/ '[]'$ Home Care/ '[x]'$ Refused Recommended Disposition /'[]'$ [place] [facility]/ '[]'$  Follow-up with PCP Additional Notes: Patient advised ED for abdominal pain- patient refuses ED- adamant about appointment . Patient has been scheduled with PCP with direction to go to ED for worsening symptoms. Advised hydration and OTC medication for symptoms since patient has not tried.

## 2021-09-10 ENCOUNTER — Encounter: Payer: Self-pay | Admitting: Physician Assistant

## 2021-09-10 ENCOUNTER — Ambulatory Visit (INDEPENDENT_AMBULATORY_CARE_PROVIDER_SITE_OTHER): Payer: Medicare Other | Admitting: Physician Assistant

## 2021-09-10 VITALS — BP 138/82 | HR 66 | Temp 97.3°F | Wt 161.0 lb

## 2021-09-10 DIAGNOSIS — R1032 Left lower quadrant pain: Secondary | ICD-10-CM

## 2021-09-10 DIAGNOSIS — R197 Diarrhea, unspecified: Secondary | ICD-10-CM | POA: Diagnosis not present

## 2021-09-10 NOTE — Progress Notes (Deleted)
Established Patient Office Visit  Name: Ross Riley   MRN: [id number]    DOB: [birth year]   Date:09/10/2021  Today's Provider: Talitha Givens, MHS, PA-C Introduced myself to the patient as a PA-C and provided education on APPs in clinical practice.         Subjective  Chief Complaint  No chief complaint on file.   HPI   Patient Active Problem List   Diagnosis Date Noted   Epidermal inclusion cyst 06/09/2019   Erectile dysfunction due to arterial insufficiency 09/14/2018   Abnormal ejaculation 09/14/2018   Former smoker 07/08/2017   Hypothyroidism 07/08/2017   Hx of adenomatous colonic polyps 06/09/2017   History of diverticulitis 06/09/2017   History of Clostridium difficile colitis 06/09/2017   Recurrent colitis due to Clostridium difficile 03/25/2017   Chills (without fever) 03/25/2017   Diverticulitis 12/04/2016   Anemia 12/04/2016   Chronic right shoulder pain 11/24/2016   Other constipation 11/24/2016   BPPV (benign paroxysmal positional vertigo), bilateral 10/09/2016   Knee stiffness 10/03/2016   S/P TKR (total knee replacement), left 09/22/2016   Osteoarthritis of left knee 06/05/2016   Atypical intracranial meningioma ([place]) 05/22/2016   Abdominal pain, LLQ (left lower quadrant) 02/11/2016   Seasonal allergies 01/24/2015   BPH (benign prostatic hyperplasia) 12/06/2014   Pain in joint, ankle and foot 10/26/2014   Gonalgia 10/26/2014   Osteoarthritis of multiple joints 10/26/2014   Bilateral cataracts 10/26/2014   ED (erectile dysfunction) of organic origin 10/26/2014   Acid reflux 10/26/2014   Hyperlipidemia associated with type 2 diabetes mellitus ([place]) 10/26/2014   Essential hypertension 10/26/2014   Type 2 diabetes mellitus with diabetic cataract, without long-term current use of insulin ([place]) 08/02/2014   Focal dystonia 07/22/2012    Past Surgical History:  Procedure Laterality Date   CATARACT EXTRACTION Bilateral    EXTRACORPOREAL SHOCK  WAVE LITHOTRIPSY Right    JOINT REPLACEMENT Left 08/2016   left knee, [place]   KIDNEY STONE SURGERY  2009   PROSTATE SURGERY  2011   TRANSURETHRAL RESECTION OF PROSTATE N/A 12/06/2014   Procedure: TRANSURETHRAL RESECTION OF THE PROSTATE (TURP);  Surgeon: Nickie Retort, MD;  Location: [facility] ORS;  Service: Urology;  Laterality: N/A;    Family History  Problem Relation Age of Onset   Heart disease Mother     Social History   Tobacco Use   Smoking status: Former    Packs/day: 1.00    Years: 32.00    Total pack years: 32.00    Types: Cigarettes    Quit date: 03/04/1991    Years since quitting: 30.5   Smokeless tobacco: Former  Substance Use Topics   Alcohol use: Not Currently    Alcohol/week: 1.0 standard drink of alcohol    Types: 1 Shots of liquor per week     Current Outpatient Medications:    ACCU-CHEK FASTCLIX LANCETS MISC, check blood sugar up to 1 time daily as directed, Disp: 102 each, Rfl: 3   albuterol (VENTOLIN HFA) 108 (90 Base) MCG/ACT inhaler, Inhale 2 puffs into the lungs every 4 (four) hours as needed for shortness of breath (cough)., Disp: 6.7 each, Rfl: 0   Alcohol Swabs (B-D SINGLE USE SWABS REGULAR) PADS, check blood sugar up to 1 time daily as directed, Disp: 100 each, Rfl: 3   aspirin EC 81 MG tablet, Take by mouth. (Patient not taking: Reported on 01/01/2021), Disp: , Rfl:    atorvastatin (LIPITOR) 20 MG tablet,  TAKE 1 TABLET(20 MG) BY MOUTH DAILY, Disp: 90 tablet, Rfl: 1   benazepril-hydrochlorthiazide (LOTENSIN HCT) 10-12.5 MG tablet, TAKE 1 TABLET BY MOUTH DAILY, Disp: 90 tablet, Rfl: 1   betamethasone valerate (VALISONE) 0.1 % cream, Apply topically 2 (two) times daily. For 2-4 weeks, apply to areas of scalp., Disp: 30 g, Rfl: 0   Blood Glucose Monitoring Suppl (ACCU-CHEK AVIVA PLUS) w/Device KIT, Use device to check blood sugar up to 1 time daily as directed, Disp: 1 kit, Rfl: 0   brompheniramine-pseudoephedrine-DM 30-2-10 MG/5ML syrup, Take 5 mLs by  mouth 4 (four) times daily as needed., Disp: 200 mL, Rfl: 0   Cholecalciferol (VITAMIN D3) 125 MCG (5000 UT) CAPS, Take by mouth., Disp: , Rfl:    Ciclopirox 1 % shampoo, Apply 5 mLs topically 2 (two) times a week. Leave shampoo on scalp for 5 minutes, then rinse off. Use for total of 4 weeks. May repeat if problem returns., Disp: 120 mL, Rfl: 1   dorzolamide-timolol (COSOPT) 22.3-6.8 MG/ML ophthalmic solution, dorzolamide 22.3 mg-timolol 6.8 mg/mL eye drops, Disp: , Rfl:    glipiZIDE (GLUCOTROL XL) 5 MG 24 hr tablet, TAKE 1 TABLET(5 MG) BY MOUTH DAILY WITH BREAKFAST, Disp: 90 tablet, Rfl: 0   glucose blood test strip, check blood sugar up to 1 time daily as directed, Disp: 100 each, Rfl: 3   latanoprost (XALATAN) 0.005 % ophthalmic solution, INT 1 GTT INTO OU QHS, Disp: , Rfl: 6   levothyroxine (SYNTHROID) 75 MCG tablet, Take 50 mcg by mouth. , Disp: , Rfl:    meclizine (ANTIVERT) 25 MG tablet, Take 1 tablet (25 mg total) by mouth 3 (three) times daily as needed for dizziness., Disp: 60 tablet, Rfl: 2   metoprolol tartrate (LOPRESSOR) 25 MG tablet, TAKE 1/2 TABLET(12.5 MG) BY MOUTH TWICE DAILY, Disp: 90 tablet, Rfl: 0   naproxen (NAPROSYN) 500 MG tablet, Take 1 tablet (500 mg total) by mouth 2 (two) times daily with a meal. For 2-4 weeks then as needed, Disp: 60 tablet, Rfl: 2   nortriptyline (PAMELOR) 10 MG capsule, take 30 mg nightly for one week, then increase to 40 mg nightly, Disp: , Rfl:    ondansetron (ZOFRAN ODT) 4 MG disintegrating tablet, Take 1 tablet (4 mg total) by mouth every 8 (eight) hours as needed for nausea or vomiting., Disp: 30 tablet, Rfl: 2   pantoprazole (PROTONIX) 40 MG tablet, Take 1 tablet (40 mg total) by mouth daily for 14 days., Disp: 14 tablet, Rfl: 0   sildenafil (VIAGRA) 100 MG tablet, Take 1 tablet (100 mg total) by mouth daily as needed for erectile dysfunction. Take two hours prior to intercourse on an empty stomach, Disp: 30 tablet, Rfl: 2   sucralfate (CARAFATE)  1 g tablet, Take 1 tablet (1 g total) by mouth 4 (four) times daily -  with meals and at bedtime for 5 days., Disp: 20 tablet, Rfl: 0  Allergies  Allergen Reactions   Metformin And Related Other (See Comments)    Headache and dizzines   Metformin Hcl Other (See Comments)    headache and dizziness    I personally reviewed {Reviewed:14835} with the patient/caregiver today.   ROS    Objective  There were no vitals filed for this visit.  There is no height or weight on file to calculate BMI.  Physical Exam   Recent Results (from the past 2160 hour(s))  Basic metabolic panel     Status: Abnormal   Collection Time: 07/13/21  3:05  PM  Result Value Ref Range   Sodium 140 135 - 145 mmol/L   Potassium 3.9 3.5 - 5.1 mmol/L    Comment: HEMOLYSIS AT THIS LEVEL MAY AFFECT RESULT   Chloride 106 98 - 111 mmol/L   CO2 26 22 - 32 mmol/L   Glucose, Bld 137 (H) 70 - 99 mg/dL    Comment: Glucose reference range applies only to samples taken after fasting for at least 8 hours.   BUN 11 8 - 23 mg/dL   Creatinine, Ser 0.99 0.61 - 1.24 mg/dL   Calcium 8.8 (L) 8.9 - 10.3 mg/dL   GFR, Estimated >60 >60 mL/min    Comment: (NOTE) Calculated using the CKD-EPI Creatinine Equation (2021)    Anion gap 8 5 - 15    Comment: Performed at [facility], [address] [place], [place] [address]  CBC     Status: None   Collection Time: 07/13/21  3:05 PM  Result Value Ref Range   WBC 4.9 4.0 - 10.5 K/uL   RBC 5.13 4.22 - 5.81 MIL/uL   Hemoglobin 15.3 13.0 - 17.0 g/dL   HCT 46.7 39.0 - 52.0 %   MCV 91.0 80.0 - 100.0 fL   MCH 29.8 26.0 - 34.0 pg   MCHC 32.8 30.0 - 36.0 g/dL   RDW 12.1 11.5 - 15.5 %   Platelets 232 150 - 400 K/uL   nRBC 0.0 0.0 - 0.2 %    Comment: Performed at [facility], [place], [place] [address]  Troponin I (High Sensitivity)     Status: None   Collection Time: 07/13/21  3:05 PM  Result Value Ref Range   Troponin I (High Sensitivity) 3  <18 ng/L    Comment: (NOTE) Elevated high sensitivity troponin I (hsTnI) values and significant  changes across serial measurements may suggest ACS but many other  chronic and acute conditions are known to elevate hsTnI results.  Refer to the "Links" section for chest pain algorithms and additional  guidance. Performed at [facility], [address] [place], [place] [address]   Hepatic function panel     Status: Abnormal   Collection Time: 07/13/21  3:05 PM  Result Value Ref Range   Total Protein 7.7 6.5 - 8.1 g/dL   Albumin 4.0 3.5 - 5.0 g/dL   AST 36 15 - 41 U/L   ALT 24 0 - 44 U/L   Alkaline Phosphatase 53 38 - 126 U/L   Total Bilirubin 1.2 0.3 - 1.2 mg/dL   Bilirubin, Direct 0.3 (H) 0.0 - 0.2 mg/dL   Indirect Bilirubin 0.9 0.3 - 0.9 mg/dL    Comment: Performed at [facility], [address] [place], [place] [address]  Lipase, blood     Status: None   Collection Time: 07/13/21  3:05 PM  Result Value Ref Range   Lipase 38 11 - 51 U/L    Comment: Performed at [facility], [place], [place] [address]  Troponin I (High Sensitivity)     Status: None   Collection Time: 07/13/21  5:09 PM  Result Value Ref Range   Troponin I (High Sensitivity) 4 <18 ng/L    Comment: (NOTE) Elevated high sensitivity troponin I (hsTnI) values and significant  changes across serial measurements may suggest ACS but many other  chronic and acute conditions are known to elevate hsTnI results.  Refer to the "Links" section for chest pain algorithms and additional  guidance.  Performed at [facility], [address] [place], [place] [address]   D-dimer, quantitative     Status: Abnormal   Collection Time: 07/13/21  6:08 PM  Result Value Ref Range   D-Dimer, Quant 0.53 (H) 0.00 - 0.50 ug/mL-FEU    Comment: (NOTE) At the manufacturer cut-off value of 0.5 g/mL FEU, this assay has a negative predictive value of 95-100%.This assay is intended  for use in conjunction with a clinical pretest probability (PTP) assessment model to exclude pulmonary embolism (PE) and deep venous thrombosis (DVT) in outpatients suspected of PE or DVT. Results should be correlated with clinical presentation. Performed at [facility], [address] [place], [place] [address]      PHQ2/9:    05/29/2021   10:24 AM 04/17/2021    2:18 PM 01/01/2021    1:38 PM 11/15/2019    2:10 PM 05/24/2019    1:45 PM  Depression screen PHQ 2/9  Decreased Interest 0 0 0 0 0  Down, Depressed, Hopeless 0 0 0 0 0  PHQ - 2 Score 0 0 0 0 0  Altered sleeping 0 0     Tired, decreased energy 0 0     Change in appetite 0 0     Feeling bad or failure about yourself  0 0     Trouble concentrating 0 0     Moving slowly or fidgety/restless 0 0     Suicidal thoughts  0     PHQ-9 Score 0 0     Difficult doing work/chores Not difficult at all Not difficult at all         Fall Risk:    05/29/2021   10:24 AM 04/17/2021    2:18 PM 01/01/2021    1:38 PM 07/18/2020    3:55 PM 03/16/2020    3:38 PM  Fall Risk   Falls in the past year? 0 0 0 0 0  Number falls in past yr: 0 0  0 0  Injury with Fall? 0 0  0 0  Risk for fall due to : No Fall Risks No Fall Risks Medication side effect    Follow up Falls evaluation completed Falls evaluation completed Falls evaluation completed;Education provided;Falls prevention discussed Falls evaluation completed       Functional Status Survey:      Assessment & Plan

## 2021-09-10 NOTE — Patient Instructions (Signed)
It seems like your abdominal pain is getting better and your diarrhea is improving  At this time I recommend the following Try eating a bland diet or BRAT diet to prevent upsetting your stomach You can slowly return to your normal foods as you feel able to  Stay well hydrated with plenty of water  If you need to you can alternate Tylenol and Ibuprofen for the pain (no more than 3500 mg of Tylenol per day)  You can take Imodium or Pepto Bismol  as needed for the diarrhea   If you start having more intense pain, fevers, blood in your stool, or constipation please let us know.

## 2021-09-10 NOTE — Assessment & Plan Note (Addendum)
Acute, recurrent per chart review Patient reports intermittent 4/10 cramping pain with diarrhea that appears to be improving States diarrhea is now about 2 times per day  Denies fever, blood in stool, odorous bowel movements, nausea, vomiting, chills Unsure how or if he is being treated by another provider based on HPI Due to improving nature of concern recommend alternating Tylenol and Ibuprofen as needed for pain, staying well hydrated, bland diet with gradual return to normal diet as tolerable. Patient denies hx of diverticulitis - unsure if language barrier is impacting understanding of questions. Reviewed previous abdominal c/o - patient has hx of diverticulitis, c.dif and constipation  Offered to run blood work to check for dehydration and inflammation but pt declined.  Reviewed return and ED precautions with him Follow up as needed

## 2021-09-10 NOTE — Progress Notes (Signed)
Acute Office Visit   Patient: Ross Riley   DOB: 1950/10/16   71 y.o. Male  MRN: [id number] Visit Date: 09/10/2021  Today's healthcare provider: Dani Gobble Shasta Chinn, PA-C  Introduced myself to the patient as a Journalist, newspaper and provided education on APPs in clinical practice.    Chief Complaint  Patient presents with   Abdominal Pain    Left side; started about a week ago.    Subjective    HPI HPI     Abdominal Pain    Additional comments: Left side; started about a week ago.       Last edited by Kizzie Furnish, CMA on 09/10/2021  3:23 PM.       LLQ pain and diarrhea  Onset: about 10 days ago Alleviating: nothing  Aggravating: Eating seems to make it worse  Interventions: took colipop ? Unsure of other medications he took but he states one was for infection and one was for discomfort  Pain Level and character: Intermittent pain, 4/10 right now, cramping pain  Associated symptoms: see ROS  Describes diarrhea as having 2 stools per day that are loose or almost liquid in nature. States it was about 4 times per day at onset Denies anyone around him having similar symptoms  States he feels like it is getting better at this time  States he has seen PCP for something like this in the past but was not sure of year or therapy he received.   Medications: Outpatient Medications Prior to Visit  Medication Sig   albuterol (VENTOLIN HFA) 108 (90 Base) MCG/ACT inhaler Inhale 2 puffs into the lungs every 4 (four) hours as needed for shortness of breath (cough).   atorvastatin (LIPITOR) 20 MG tablet TAKE 1 TABLET(20 MG) BY MOUTH DAILY   benazepril-hydrochlorthiazide (LOTENSIN HCT) 10-12.5 MG tablet TAKE 1 TABLET BY MOUTH DAILY   betamethasone valerate (VALISONE) 0.1 % cream Apply topically 2 (two) times daily. For 2-4 weeks, apply to areas of scalp.   Cholecalciferol (VITAMIN D3) 125 MCG (5000 UT) CAPS Take by mouth.   dorzolamide-timolol (COSOPT) 22.3-6.8 MG/ML ophthalmic solution  dorzolamide 22.3 mg-timolol 6.8 mg/mL eye drops   glipiZIDE (GLUCOTROL XL) 5 MG 24 hr tablet TAKE 1 TABLET(5 MG) BY MOUTH DAILY WITH BREAKFAST   latanoprost (XALATAN) 0.005 % ophthalmic solution INT 1 GTT INTO OU QHS   levothyroxine (SYNTHROID) 75 MCG tablet Take 50 mcg by mouth.    meclizine (ANTIVERT) 25 MG tablet Take 1 tablet (25 mg total) by mouth 3 (three) times daily as needed for dizziness.   metoprolol tartrate (LOPRESSOR) 25 MG tablet TAKE 1/2 TABLET(12.5 MG) BY MOUTH TWICE DAILY   naproxen (NAPROSYN) 500 MG tablet Take 1 tablet (500 mg total) by mouth 2 (two) times daily with a meal. For 2-4 weeks then as needed   nortriptyline (PAMELOR) 10 MG capsule take 30 mg nightly for one week, then increase to 40 mg nightly   ondansetron (ZOFRAN ODT) 4 MG disintegrating tablet Take 1 tablet (4 mg total) by mouth every 8 (eight) hours as needed for nausea or vomiting.   sildenafil (VIAGRA) 100 MG tablet Take 1 tablet (100 mg total) by mouth daily as needed for erectile dysfunction. Take two hours prior to intercourse on an empty stomach   ACCU-CHEK FASTCLIX LANCETS MISC check blood sugar up to 1 time daily as directed   Alcohol Swabs (B-D SINGLE USE SWABS REGULAR) PADS check blood sugar up to 1  time daily as directed   aspirin EC 81 MG tablet Take by mouth. (Patient not taking: Reported on 01/01/2021)   Blood Glucose Monitoring Suppl (ACCU-CHEK AVIVA PLUS) w/Device KIT Use device to check blood sugar up to 1 time daily as directed   glucose blood test strip check blood sugar up to 1 time daily as directed   pantoprazole (PROTONIX) 40 MG tablet Take 1 tablet (40 mg total) by mouth daily for 14 days.   sucralfate (CARAFATE) 1 g tablet Take 1 tablet (1 g total) by mouth 4 (four) times daily -  with meals and at bedtime for 5 days.   [DISCONTINUED] brompheniramine-pseudoephedrine-DM 30-2-10 MG/5ML syrup Take 5 mLs by mouth 4 (four) times daily as needed.   [DISCONTINUED] Ciclopirox 1 % shampoo Apply 5  mLs topically 2 (two) times a week. Leave shampoo on scalp for 5 minutes, then rinse off. Use for total of 4 weeks. May repeat if problem returns.   No facility-administered medications prior to visit.    Review of Systems  Constitutional:  Negative for chills, diaphoresis, fatigue and fever.  Gastrointestinal:  Positive for abdominal distention (bloating), abdominal pain and diarrhea. Negative for blood in stool, nausea and vomiting.  Neurological:  Negative for dizziness, light-headedness and headaches.       Objective    BP 138/82 (BP Location: Left Arm, Patient Position: Sitting, Cuff Size: Normal)   Pulse 66   Temp (!) 97.3 F (36.3 C) (Temporal)   Wt 161 lb (73 kg)   SpO2 99%   BMI 25.99 kg/m    Physical Exam Vitals reviewed.  Constitutional:      General: He is awake.     Appearance: Normal appearance. He is well-developed, well-groomed and normal weight.  HENT:     Head: Normocephalic and atraumatic.  Cardiovascular:     Rate and Rhythm: Normal rate and regular rhythm.     Pulses: Normal pulses.     Heart sounds: Normal heart sounds.  Pulmonary:     Effort: Pulmonary effort is normal.     Breath sounds: Normal breath sounds. No decreased breath sounds, wheezing, rhonchi or rales.  Abdominal:     General: Abdomen is flat. Bowel sounds are normal.     Palpations: Abdomen is soft.     Tenderness: There is abdominal tenderness in the left lower quadrant. There is no guarding or rebound.     Comments: Mild tenderness with deep palpation in LLQ and some over suprapubic area.   Musculoskeletal:     Right lower leg: No edema.     Left lower leg: No edema.  Neurological:     Mental Status: He is alert.  Psychiatric:        Attention and Perception: Attention normal.        Mood and Affect: Mood and affect normal.        Speech: Speech normal.        Behavior: Behavior normal. Behavior is cooperative.       No results found for any visits on 09/10/21.   Assessment & Plan      No follow-ups on file.       Problem List Items Addressed This Visit       Other   Abdominal pain, LLQ (left lower quadrant)    Acute, recurrent per chart review Patient reports intermittent 4/10 cramping pain with diarrhea that appears to be improving States diarrhea is now about 2 times per day  Denies fever, blood in  stool, odorous bowel movements, nausea, vomiting, chills Unsure how or if he is being treated by another provider based on HPI Due to improving nature of concern recommend alternating Tylenol and Ibuprofen as needed for pain, staying well hydrated, bland diet with gradual return to normal diet as tolerable. Patient denies hx of diverticulitis - unsure if language barrier is impacting understanding of questions. Reviewed previous abdominal c/o - patient has hx of diverticulitis, c.dif and constipation  Offered to run blood work to check for dehydration and inflammation but pt declined.  Reviewed return and ED precautions with him Follow up as needed       Other Visit Diagnoses     Diarrhea, unspecified type    -  Primary Acute, new problem, appears to be resolving per patient  States he is having about 2 episodes of loose to liquid stools per day at this point- improved from 4+ per day Denies fever, chills, headaches, dizziness, blood in stool Recommend he use Pepto Bismol or Imodium as needed per manufacturer's directions Discussed bland diet with gradual return to normal diet to assist with abdominal pain and diarrhea  Recommend he stay well hydrated.  Reviewed ED and return precautions Follow up as needed..         No follow-ups on file.   I, Amilee Janvier E Mona Ayars, PA-C, have reviewed all documentation for this visit. The documentation on 09/10/21 for the exam, diagnosis, procedures, and orders are all accurate and complete.   Talitha Givens, MHS, PA-C [facility]

## 2021-09-22 ENCOUNTER — Other Ambulatory Visit: Payer: Self-pay | Admitting: Family Medicine

## 2021-09-22 DIAGNOSIS — I10 Essential (primary) hypertension: Secondary | ICD-10-CM

## 2021-09-22 DIAGNOSIS — E1136 Type 2 diabetes mellitus with diabetic cataract: Secondary | ICD-10-CM

## 2021-09-22 DIAGNOSIS — H8113 Benign paroxysmal vertigo, bilateral: Secondary | ICD-10-CM

## 2021-09-24 NOTE — Telephone Encounter (Signed)
Requested Prescriptions  Pending Prescriptions Disp Refills  . metoprolol tartrate (LOPRESSOR) 25 MG tablet [Pharmacy Med Name: METOPROLOL TARTRATE '25MG'$  TABLETS] 90 tablet 0    Sig: TAKE 1/2 TABLET(12.5 MG) BY MOUTH TWICE DAILY     Cardiovascular:  Beta Blockers Passed - 09/22/2021 11:48 AM      Passed - Last BP in normal range    BP Readings from Last 1 Encounters:  09/10/21 138/82         Passed - Last Heart Rate in normal range    Pulse Readings from Last 1 Encounters:  09/10/21 66         Passed - Valid encounter within last 6 months    Recent Outpatient Visits          2 weeks ago Diarrhea, unspecified type   [facility] Mecum, Dani Gobble, PA-C   3 months ago Arthritis of left hip   [place], DO   5 months ago Annual physical exam   [facility] Olin Hauser, DO   7 months ago Acute non-recurrent frontal sinusitis   [facility] [place], Devonne Doughty, DO   9 months ago Type 2 diabetes mellitus with diabetic cataract, without long-term current use of insulin ([place])   [place], DO      Future Appointments            In 1 month Gollan, Kathlene November, MD [facility]           . meclizine (ANTIVERT) 25 MG tablet [Pharmacy Med Name: MECLIZINE '25MG'$  RX TABLETS] 60 tablet     Sig: TAKE 1 TABLET(25 MG) BY MOUTH THREE TIMES DAILY AS NEEDED FOR DIZZINESS     Not Delegated - Gastroenterology: Antiemetics Failed - 09/22/2021 11:48 AM      Failed - This refill cannot be delegated      Passed - Valid encounter within last 6 months    Recent Outpatient Visits          2 weeks ago Diarrhea, unspecified type   [place], PA-C   3 months ago Arthritis of left hip   [place], DO   5 months ago Annual physical exam   [facility] Olin Hauser, DO   7 months ago Acute non-recurrent frontal sinusitis   [facility] [place], Devonne Doughty, DO   9 months ago Type 2 diabetes mellitus with diabetic cataract, without long-term current use of insulin ([place])   [place], DO      Future Appointments            In 1 month Gollan, Kathlene November, MD [facility], [facility]           . glipiZIDE (GLUCOTROL XL) 5 MG 24 hr tablet [Pharmacy Med Name: GLIPIZIDE ER '5MG'$  TABLETS] 90 tablet 0    Sig: TAKE 1 TABLET(5 MG) BY MOUTH DAILY WITH BREAKFAST     Endocrinology:  Diabetes - Sulfonylureas Passed - 09/22/2021 11:48 AM      Passed - HBA1C is between 0 and 7.9 and within 180 days    Hemoglobin A1C  Date Value Ref Range Status  12/26/2020 6.8  Final   Hgb A1c MFr Bld  Date Value Ref Range Status  04/17/2021 6.0 (H) <5.7 % of total  Hgb Final    Comment:    For someone without known diabetes, a hemoglobin  A1c value between 5.7% and 6.4% is consistent with prediabetes and should be confirmed with a  follow-up test. . For someone with known diabetes, a value <7% indicates that their diabetes is well controlled. A1c targets should be individualized based on duration of diabetes, age, comorbid conditions, and other considerations. . This assay result is consistent with an increased risk of diabetes. . Currently, no consensus exists regarding use of hemoglobin A1c for diagnosis of diabetes for children. .          Passed - Cr in normal range and within 360 days    Creat  Date Value Ref Range Status  04/17/2021 1.14 0.70 - 1.28 mg/dL Final   Creatinine, Ser  Date Value Ref Range Status  07/13/2021 0.99 0.61 - 1.24 mg/dL Final   Creatinine, POC  Date Value Ref Range Status  06/18/2015 0 mg/dL Final         Passed - Valid encounter within last 6 months    Recent Outpatient Visits          2 weeks ago Diarrhea,  unspecified type   [facility] Mecum, Dani Gobble, PA-C   3 months ago Arthritis of left hip   [place], DO   5 months ago Annual physical exam   [facility] Olin Hauser, DO   7 months ago Acute non-recurrent frontal sinusitis   [facility] [place], Devonne Doughty, DO   9 months ago Type 2 diabetes mellitus with diabetic cataract, without long-term current use of insulin ([place])   [facility] Olin Hauser, DO      Future Appointments            In 1 month Gollan, Kathlene November, MD [facility], [place]

## 2021-09-24 NOTE — Telephone Encounter (Signed)
Requested medication (s) are due for refill today: yes  Requested medication (s) are on the active medication list: yes  Last refill:  07/18/20  Future visit scheduled: no  Notes to clinic:  med not delegated to NT to RF   Requested Prescriptions  Pending Prescriptions Disp Refills   meclizine (ANTIVERT) 25 MG tablet [Pharmacy Med Name: MECLIZINE '25MG'$  RX TABLETS] 60 tablet     Sig: TAKE 1 TABLET(25 MG) BY MOUTH THREE TIMES DAILY AS NEEDED FOR DIZZINESS     Not Delegated - Gastroenterology: Antiemetics Failed - 09/22/2021 11:48 AM      Failed - This refill cannot be delegated      Passed - Valid encounter within last 6 months    Recent Outpatient Visits           2 weeks ago Diarrhea, unspecified type   [facility] Mecum, Dani Gobble, PA-C   3 months ago Arthritis of left hip   [place], DO   5 months ago Annual physical exam   [facility] Olin Hauser, DO   7 months ago Acute non-recurrent frontal sinusitis   [facility] [place], Devonne Doughty, DO   9 months ago Type 2 diabetes mellitus with diabetic cataract, without long-term current use of insulin ([place])   [facility] Olin Hauser, DO       Future Appointments             In 1 month Gollan, Kathlene November, MD [place], [facility]            Signed Prescriptions Disp Refills   metoprolol tartrate (LOPRESSOR) 25 MG tablet 90 tablet 0    Sig: TAKE 1/2 TABLET(12.5 MG) BY MOUTH TWICE DAILY     Cardiovascular:  Beta Blockers Passed - 09/22/2021 11:48 AM      Passed - Last BP in normal range    BP Readings from Last 1 Encounters:  09/10/21 138/82         Passed - Last Heart Rate in normal range    Pulse Readings from Last 1 Encounters:  09/10/21 66         Passed - Valid encounter within last 6 months    Recent Outpatient Visits           2 weeks ago Diarrhea,  unspecified type   [facility] Mecum, Dani Gobble, PA-C   3 months ago Arthritis of left hip   [place], DO   5 months ago Annual physical exam   [facility] Olin Hauser, DO   7 months ago Acute non-recurrent frontal sinusitis   [facility] [place], Devonne Doughty, DO   9 months ago Type 2 diabetes mellitus with diabetic cataract, without long-term current use of insulin ([place])   [facility] Olin Hauser, DO       Future Appointments             In 1 month Gollan, Kathlene November, MD [place], [facility]             glipiZIDE (GLUCOTROL XL) 5 MG 24 hr tablet 90 tablet 0    Sig: TAKE 1 TABLET(5 MG) BY MOUTH DAILY WITH BREAKFAST     Endocrinology:  Diabetes - Sulfonylureas Passed - 09/22/2021 11:48 AM      Passed - HBA1C is between  0 and 7.9 and within 180 days    Hemoglobin A1C  Date Value Ref Range Status  12/26/2020 6.8  Final   Hgb A1c MFr Bld  Date Value Ref Range Status  04/17/2021 6.0 (H) <5.7 % of total Hgb Final    Comment:    For someone without known diabetes, a hemoglobin  A1c value between 5.7% and 6.4% is consistent with prediabetes and should be confirmed with a  follow-up test. . For someone with known diabetes, a value <7% indicates that their diabetes is well controlled. A1c targets should be individualized based on duration of diabetes, age, comorbid conditions, and other considerations. . This assay result is consistent with an increased risk of diabetes. . Currently, no consensus exists regarding use of hemoglobin A1c for diagnosis of diabetes for children. .          Passed - Cr in normal range and within 360 days    Creat  Date Value Ref Range Status  04/17/2021 1.14 0.70 - 1.28 mg/dL Final   Creatinine, Ser  Date Value Ref Range Status  07/13/2021 0.99 0.61 - 1.24 mg/dL Final   Creatinine,  POC  Date Value Ref Range Status  06/18/2015 0 mg/dL Final         Passed - Valid encounter within last 6 months    Recent Outpatient Visits           2 weeks ago Diarrhea, unspecified type   [facility] Mecum, Dani Gobble, PA-C   3 months ago Arthritis of left hip   [place], DO   5 months ago Annual physical exam   [facility] Olin Hauser, DO   7 months ago Acute non-recurrent frontal sinusitis   [facility], Devonne Doughty, DO   9 months ago Type 2 diabetes mellitus with diabetic cataract, without long-term current use of insulin ([place])   [facility] Olin Hauser, DO       Future Appointments             In 1 month Gollan, Kathlene November, MD [facility], [place]

## 2021-10-16 DIAGNOSIS — M19071 Primary osteoarthritis, right ankle and foot: Secondary | ICD-10-CM | POA: Diagnosis not present

## 2021-10-16 DIAGNOSIS — M216X2 Other acquired deformities of left foot: Secondary | ICD-10-CM | POA: Diagnosis not present

## 2021-10-16 DIAGNOSIS — M19072 Primary osteoarthritis, left ankle and foot: Secondary | ICD-10-CM | POA: Diagnosis not present

## 2021-10-16 DIAGNOSIS — M24572 Contracture, left ankle: Secondary | ICD-10-CM | POA: Diagnosis not present

## 2021-10-21 ENCOUNTER — Other Ambulatory Visit: Payer: Self-pay | Admitting: Family Medicine

## 2021-10-21 DIAGNOSIS — I10 Essential (primary) hypertension: Secondary | ICD-10-CM

## 2021-10-22 NOTE — Telephone Encounter (Signed)
Requested Prescriptions  Pending Prescriptions Disp Refills  . benazepril-hydrochlorthiazide (LOTENSIN HCT) 10-12.5 MG tablet [Pharmacy Med Name: BENAZEPRIL/HCTZ 10/12.5MG  TABLETS] 90 tablet 1    Sig: TAKE 1 TABLET BY MOUTH DAILY     Cardiovascular:  ACEI + Diuretic Combos Passed - 10/21/2021  5:04 PM      Passed - Na in normal range and within 180 days    Sodium  Date Value Ref Range Status  07/13/2021 140 135 - 145 mmol/L Final  06/19/2015 142 134 - 144 mmol/L Final  12/07/2012 139 136 - 145 mmol/L Final         Passed - K in normal range and within 180 days    Potassium  Date Value Ref Range Status  07/13/2021 3.9 3.5 - 5.1 mmol/L Final    Comment:    HEMOLYSIS AT THIS LEVEL MAY AFFECT RESULT  12/07/2012 3.3 (L) 3.5 - 5.1 mmol/L Final         Passed - Cr in normal range and within 180 days    Creat  Date Value Ref Range Status  04/17/2021 1.14 0.70 - 1.28 mg/dL Final   Creatinine, Ser  Date Value Ref Range Status  07/13/2021 0.99 0.61 - 1.24 mg/dL Final   Creatinine, POC  Date Value Ref Range Status  06/18/2015 0 mg/dL Final         Passed - eGFR is 30 or above and within 180 days    GFR, Est African American  Date Value Ref Range Status  11/09/2019 83 > OR = 60 mL/min/1.110m2 Final   GFR, Est Non African American  Date Value Ref Range Status  11/09/2019 72 > OR = 60 mL/min/1.69m2 Final   GFR, Estimated  Date Value Ref Range Status  07/13/2021 >60 >60 mL/min Final    Comment:    (NOTE) Calculated using the CKD-EPI Creatinine Equation (2021)    eGFR  Date Value Ref Range Status  04/17/2021 69 > OR = 60 mL/min/1.59m2 Final    Comment:    The eGFR is based on the CKD-EPI 2021 equation. To calculate  the new eGFR from a previous Creatinine or Cystatin C result, go to https://www.kidney.org/professionals/ kdoqi/gfr%5Fcalculator          Passed - Patient is not pregnant      Passed - Last BP in normal range    BP Readings from Last 1 Encounters:   09/10/21 138/82         Passed - Valid encounter within last 6 months    Recent Outpatient Visits          1 month ago Diarrhea, unspecified type   [place], Dani Gobble, PA-C   4 months ago Arthritis of left hip   [place], DO   6 months ago Annual physical exam   [facility] Olin Hauser, DO   8 months ago Acute non-recurrent frontal sinusitis   [facility] [place], Devonne Doughty, DO   10 months ago Type 2 diabetes mellitus with diabetic cataract, without long-term current use of insulin ([place])   [place], DO      Future Appointments            In 2 weeks Gollan, Kathlene November, MD [facility], [place]

## 2021-10-23 DIAGNOSIS — M24572 Contracture, left ankle: Secondary | ICD-10-CM | POA: Diagnosis not present

## 2021-10-23 DIAGNOSIS — M19072 Primary osteoarthritis, left ankle and foot: Secondary | ICD-10-CM | POA: Diagnosis not present

## 2021-10-23 DIAGNOSIS — Z01818 Encounter for other preprocedural examination: Secondary | ICD-10-CM | POA: Diagnosis not present

## 2021-10-23 DIAGNOSIS — M216X2 Other acquired deformities of left foot: Secondary | ICD-10-CM | POA: Diagnosis not present

## 2021-11-05 ENCOUNTER — Other Ambulatory Visit
Admission: RE | Admit: 2021-11-05 | Discharge: 2021-11-05 | Disposition: A | Discharge status: Home / Self Care | Payer: Medicare Other | Source: Ambulatory Visit | Attending: Cardiovascular Disease | Admitting: Cardiovascular Disease

## 2021-11-05 ENCOUNTER — Encounter: Payer: Self-pay | Admitting: Cardiovascular Disease

## 2021-11-05 ENCOUNTER — Ambulatory Visit: Payer: Medicare Other | Attending: Cardiovascular Disease | Admitting: Cardiovascular Disease

## 2021-11-05 VITALS — BP 100/64 | HR 57 | Ht 66.0 in | Wt 161.1 lb

## 2021-11-05 DIAGNOSIS — I209 Angina pectoris, unspecified: Secondary | ICD-10-CM | POA: Insufficient documentation

## 2021-11-05 DIAGNOSIS — R079 Chest pain, unspecified: Secondary | ICD-10-CM | POA: Diagnosis not present

## 2021-11-05 DIAGNOSIS — R072 Precordial pain: Secondary | ICD-10-CM

## 2021-11-05 LAB — BASIC METABOLIC PANEL
Anion gap: 10 (ref 5–15)
BUN: 13 mg/dL (ref 8–23)
CO2: 28 mmol/L (ref 22–32)
Calcium: 8.5 mg/dL — ABNORMAL LOW (ref 8.9–10.3)
Chloride: 103 mmol/L (ref 98–111)
Creatinine, Ser: 1.14 mg/dL (ref 0.61–1.24)
GFR, Estimated: >60 mL/min (ref >60)
Glucose, Bld: 131 mg/dL — ABNORMAL HIGH (ref 70–99)
Potassium: 3.6 mmol/L (ref 3.5–5.1)
Sodium: 141 mmol/L (ref 135–145)

## 2021-11-05 NOTE — Patient Instructions (Addendum)
Medication Instructions:  No changes  If you need a refill on your cardiac medications before your next appointment, please call your pharmacy.   Lab work:  Today at the medical mall: [facility]  -  Please go to the [place] Entrance at [place] in at the Registration Desk: 1st desk to the right, past the screening table   Testing/Procedures:  Your physician has requested that you have cardiac CT. Cardiac computed tomography (CT) is a painless test that uses an x-ray machine to take clear, detailed pictures of your heart.   Your cardiac CT has been scheduled Monday 11/18/21 at 9:30 AM at the below location:  [facility] [place], [place] [address] Check in at the [place] (First desk on right) in the [place] [phone]   Please arrive 15 mins early for check-in and test prep.  Please follow these instructions carefully (unless otherwise directed):  Hold all erectile dysfunction medications at least 3 days (72 hrs) prior to test.  On the Night Before the Test:  Be sure to Drink plenty of water. Do not consume any caffeinated/decaffeinated beverages or chocolate 12 hours prior to your test.    On the Day of the Test:  Drink plenty of water until 1 hour prior to the test. Do not eat any food 4 hours prior to the test. You may take your regular medications prior to the test.         After the Test:  Drink plenty of water. After receiving IV contrast, you may experience a mild flushed feeling. This is normal. On occasion, you may experience a mild rash up to 24 hours after the test. This is not dangerous. If this occurs, you can take Benadryl 25 mg and increase your fluid intake. If you experience trouble breathing, this can be serious. If it is severe call 911 IMMEDIATELY. If it is mild, please call our office. If you take any of these medications: Glipizide/Metformin, Avandament, Glucavance, please do  not take 48 hours after completing test unless otherwise instructed.   For non-scheduling related questions, please contact the cardiac imaging nurse navigator should you have any questions/concerns: Marchia Bond, Cardiac Imaging Nurse Navigator Gordy Clement, Cardiac Imaging Nurse Navigator [facility] Direct Office Dial: [phone]   For scheduling needs, including cancellations and rescheduling, please call [place], [phone].   Follow-Up: At [facility], you and your health needs are our priority.  As part of our continuing mission to provide you with exceptional heart care, we have created designated Provider Care Teams.  These Care Teams include your primary Cardiologist (physician) and Advanced Practice Providers (APPs -  Physician Assistants and Nurse Practitioners) who all work together to provide you with the care you need, when you need it.  You will need a follow up appointment as needed  Providers on your designated Care Team:   [place] Hodgkins, NP Christell Faith, PA-C Cadence Kathlen Mody, [place]  COVID-19 Vaccine Information can be found at: [email or web address].uk For questions related to vaccine distribution or appointments, please email [email or web address] or call [phone].

## 2021-11-05 NOTE — Progress Notes (Signed)
Shortness of cardiology Office Note  Date:  11/05/2021   ID:  Ross Riley, DOB 04-Jan-1951, MRN [id number]  PCP:  Olin Hauser, DO   Chief Complaint  Patient presents with   New Patient (Initial Visit)    Ref by Dr. Parks Ranger for chest pain. Patient c/o shortness of breath. Medications reviewed by the patient verbally.     HPI:  [name] is a 71 year old gentleman with past medical history of Diabetes type 2 Hypertension Hyperlipidemia Smoker, quit Who presents by referral from the emergency room for chest pain  Recently seen in the emergency room Jul 13, 2021 10 AM , he experienced fairly acute onset of aching, dull, substernal chest pain.  This pain has been fairly constant since onset.  He describes it as severe as up to 8 out of 10 but now about a 6 out of 10.  Is been constant.  Its remained in his left parasternal area, no radiation to the arms, or back.  Pain is not sharp or tearing.  No significant shortness of breath.  No nausea or vomiting.  Denies any specific alleviating or aggravating factors.  Reports he has a history of chest pain in the past  Cardiac enzymes negative, EKG nonacute Given GI cocktail in the ER Discharged on PPI daily with sucralfate 3 times daily  On further discussion today he reports having 3 episodes of chest pain Once this year, 2 episodes last year Typically lasting several hours at a time, left side of his chest, throbbing  He is sedentary, no regular exercise, " I do nothing"  Remote smoking Other risk factors include diabetes, hyperlipidemia  EKG personally reviewed by myself on todays visit Nsr rate 57 bpm no ST or T wave changes  PMH:   has a past medical history of Arthritis, BPH (benign prostatic hyperplasia), Chronic kidney disease, Dystonia, ED (erectile dysfunction), GERD (gastroesophageal reflux disease), Glaucoma (increased eye pressure), Hematuria, Hemorrhoids, Hyperlipidemia, Hypertension, and Thyroid  disease.  PSH:    Past Surgical History:  Procedure Laterality Date   CATARACT EXTRACTION Bilateral    EXTRACORPOREAL SHOCK WAVE LITHOTRIPSY Right    JOINT REPLACEMENT Left 08/2016   left knee, [place]   KIDNEY STONE SURGERY  2009   PROSTATE SURGERY  2011   TRANSURETHRAL RESECTION OF PROSTATE N/A 12/06/2014   Procedure: TRANSURETHRAL RESECTION OF THE PROSTATE (TURP);  Surgeon: Nickie Retort, MD;  Location: [facility] ORS;  Service: Urology;  Laterality: N/A;    Current Outpatient Medications  Medication Sig Dispense Refill   atorvastatin (LIPITOR) 20 MG tablet TAKE 1 TABLET(20 MG) BY MOUTH DAILY 90 tablet 1   benazepril-hydrochlorthiazide (LOTENSIN HCT) 10-12.5 MG tablet TAKE 1 TABLET BY MOUTH DAILY 90 tablet 1   betamethasone valerate (VALISONE) 0.1 % cream Apply topically 2 (two) times daily. For 2-4 weeks, apply to areas of scalp. 30 g 0   dorzolamide-timolol (COSOPT) 22.3-6.8 MG/ML ophthalmic solution dorzolamide 22.3 mg-timolol 6.8 mg/mL eye drops     glipiZIDE (GLUCOTROL XL) 5 MG 24 hr tablet TAKE 1 TABLET(5 MG) BY MOUTH DAILY WITH BREAKFAST 90 tablet 0   latanoprost (XALATAN) 0.005 % ophthalmic solution INT 1 GTT INTO OU QHS  6   levothyroxine (SYNTHROID) 75 MCG tablet Take 75 mcg by mouth.     metoprolol tartrate (LOPRESSOR) 25 MG tablet TAKE 1/2 TABLET(12.5 MG) BY MOUTH TWICE DAILY 90 tablet 0   omeprazole (PRILOSEC) 40 MG capsule 1 C PO QD BEFORE BREAKFAST 15-30 MINUTES BEFORE FIRST MEAL OF  THE DAY     ACCU-CHEK FASTCLIX LANCETS MISC check blood sugar up to 1 time daily as directed (Patient not taking: Reported on 11/05/2021) 102 each 3   albuterol (VENTOLIN HFA) 108 (90 Base) MCG/ACT inhaler Inhale 2 puffs into the lungs every 4 (four) hours as needed for shortness of breath (cough). (Patient not taking: Reported on 11/05/2021) 6.7 each 0   Alcohol Swabs (B-D SINGLE USE SWABS REGULAR) PADS check blood sugar up to 1 time daily as directed (Patient not taking: Reported on 11/05/2021) 100  each 3   Blood Glucose Monitoring Suppl (ACCU-CHEK AVIVA PLUS) w/Device KIT Use device to check blood sugar up to 1 time daily as directed (Patient not taking: Reported on 11/05/2021) 1 kit 0   Cholecalciferol (VITAMIN D3) 125 MCG (5000 UT) CAPS Take by mouth. (Patient not taking: Reported on 11/05/2021)     glucose blood test strip check blood sugar up to 1 time daily as directed (Patient not taking: Reported on 11/05/2021) 100 each 3   meclizine (ANTIVERT) 25 MG tablet TAKE 1 TABLET(25 MG) BY MOUTH THREE TIMES DAILY AS NEEDED FOR DIZZINESS (Patient not taking: Reported on 11/05/2021) 60 tablet 2   naproxen (NAPROSYN) 500 MG tablet Take 1 tablet (500 mg total) by mouth 2 (two) times daily with a meal. For 2-4 weeks then as needed (Patient not taking: Reported on 11/05/2021) 60 tablet 2   nortriptyline (PAMELOR) 10 MG capsule take 30 mg nightly for one week, then increase to 40 mg nightly (Patient not taking: Reported on 11/05/2021)     ondansetron (ZOFRAN ODT) 4 MG disintegrating tablet Take 1 tablet (4 mg total) by mouth every 8 (eight) hours as needed for nausea or vomiting. (Patient not taking: Reported on 11/05/2021) 30 tablet 2   pantoprazole (PROTONIX) 40 MG tablet Take 1 tablet (40 mg total) by mouth daily for 14 days. (Patient not taking: Reported on 11/05/2021) 14 tablet 0   sildenafil (VIAGRA) 100 MG tablet Take 1 tablet (100 mg total) by mouth daily as needed for erectile dysfunction. Take two hours prior to intercourse on an empty stomach (Patient not taking: Reported on 11/05/2021) 30 tablet 2   sucralfate (CARAFATE) 1 g tablet Take 1 tablet (1 g total) by mouth 4 (four) times daily -  with meals and at bedtime for 5 days. (Patient not taking: Reported on 11/05/2021) 20 tablet 0   No current facility-administered medications for this visit.     Allergies:   Metformin and related and Metformin hcl   Social History:  The patient  reports that he quit smoking about 30 years ago. His smoking use included  cigarettes. He has a 32.00 pack-year smoking history. He has quit using smokeless tobacco. He reports that he does not currently use alcohol after a past usage of about 1.0 standard drink of alcohol per week. He reports that he does not use drugs.   Family History:   family history includes Heart disease in his mother.    Review of Systems: Review of Systems  Constitutional: Negative.   HENT: Negative.    Respiratory: Negative.    Cardiovascular:  Positive for chest pain.  Gastrointestinal: Negative.   Musculoskeletal: Negative.   Neurological: Negative.   Psychiatric/Behavioral: Negative.    All other systems reviewed and are negative.   PHYSICAL EXAM: VS:  BP 100/64 (BP Location: Right Arm, Patient Position: Sitting, Cuff Size: Normal)   Pulse (!) 57   Ht $R'5\' 6"'uX$  (1.676 m)   Wt  161 lb 2 oz (73.1 kg)   SpO2 96%   BMI 26.01 kg/m  , BMI Body mass index is 26.01 kg/m. Constitutional:  oriented to person, place, and time. No distress.  HENT:  Head: Grossly normal Eyes:  no discharge. No scleral icterus.  Neck: No JVD, no carotid bruits  Cardiovascular: Regular rate and rhythm, no murmurs appreciated Pulmonary/Chest: Clear to auscultation bilaterally, no wheezes or rails Abdominal: Soft.  no distension.  no tenderness.  Musculoskeletal: Normal range of motion Neurological:  normal muscle tone. Coordination normal. No atrophy Skin: Skin warm and dry Psychiatric: normal affect, pleasant  Recent Labs: 07/13/2021: ALT 24; BUN 11; Creatinine, Ser 0.99; Hemoglobin 15.3; Platelets 232; Potassium 3.9; Sodium 140   Lipid Panel Lab Results  Component Value Date   CHOL 162 11/09/2019   HDL 49 11/09/2019   LDLCALC 86 11/09/2019   TRIG 170 (H) 11/09/2019      Wt Readings from Last 3 Encounters:  11/05/21 161 lb 2 oz (73.1 kg)  09/10/21 161 lb (73 kg)  07/13/21 168 lb (76.2 kg)     ASSESSMENT AND PLAN:  Problem List Items Addressed This Visit   None Visit Diagnoses      Chest pain of uncertain etiology    -  Primary   Angina pectoris (HCC)          Angina/chest pain Risk factors for ischemia include prior smoking history, hyperlipidemia, diabetes Several episodes, most recently leading to evaluation in the emergency room Cardiac enzymes negative at the time, EKG nonacute Given his risk factors, we have recommended ischemic work-up Prior history normal renal function We will schedule him for cardiac CTA for further evaluation  Diabetes type 2 A1c typically in the 6 range, managed by primary care  Hyperlipidemia Total cholesterol 188 LDL 96 on atorvastatin Further guidance based on cardiac CTA images  Essential hypertension Blood pressure is well controlled on today's visit. No changes made to the medications.    Total encounter time more than 50 minutes  Greater than 50% was spent in counseling and coordination of care with the patient    Signed, Esmond Plants, M.D., Ph.D. [place], [place]

## 2021-11-07 ENCOUNTER — Ambulatory Visit (INDEPENDENT_AMBULATORY_CARE_PROVIDER_SITE_OTHER): Payer: Medicare Other | Admitting: Internal Medicine

## 2021-11-07 ENCOUNTER — Ambulatory Visit: Payer: Self-pay | Admitting: *Deleted

## 2021-11-07 ENCOUNTER — Encounter: Payer: Self-pay | Admitting: Internal Medicine

## 2021-11-07 VITALS — BP 140/82 | HR 61 | Temp 96.8°F | Wt 159.0 lb

## 2021-11-07 DIAGNOSIS — R197 Diarrhea, unspecified: Secondary | ICD-10-CM

## 2021-11-07 DIAGNOSIS — K5792 Diverticulitis of intestine, part unspecified, without perforation or abscess without bleeding: Secondary | ICD-10-CM

## 2021-11-07 DIAGNOSIS — R1012 Left upper quadrant pain: Secondary | ICD-10-CM | POA: Diagnosis not present

## 2021-11-07 MED ORDER — METRONIDAZOLE 500 MG PO TABS: 500.0000 mg | ORAL_TABLET | Freq: Three times a day (TID) | ORAL | 0 refills | Status: AC

## 2021-11-07 MED ORDER — CIPROFLOXACIN HCL 500 MG PO TABS: 500.0000 mg | ORAL_TABLET | Freq: Two times a day (BID) | ORAL | 0 refills | Status: AC

## 2021-11-07 NOTE — Progress Notes (Signed)
Subjective:    Patient ID: Ross Riley, male    DOB: 18-Apr-1950, 71 y.o.   MRN: [id number]  HPI  Patient presents to clinic today with complaint of abdominal pain and diarrhea. This started 5 days ago after eating tomatoes for dinner.  The pain is located in his left upper abdomen.  He describes the pain as cramping.  He reports 3-4 loose stools daily.  He did vomit twice a few days ago but this has resolved.  He denies nausea, reflux, constipation, bloating or blood in his stool.  He was seen 08/2021 for the same.  He has a history of GERD, diverticulosis and C. difficile colitis.  Colonoscopy from 01/2017 reviewed.  Review of Systems     Past Medical History:  Diagnosis Date   Arthritis    BPH (benign prostatic hyperplasia)    Chronic kidney disease    kidney stones   Dystonia    ED (erectile dysfunction)    GERD (gastroesophageal reflux disease)    Glaucoma (increased eye pressure)    Hematuria    Hemorrhoids    Hyperlipidemia    Hypertension    Thyroid disease     Current Outpatient Medications  Medication Sig Dispense Refill   ACCU-CHEK FASTCLIX LANCETS MISC check blood sugar up to 1 time daily as directed (Patient not taking: Reported on 11/05/2021) 102 each 3   albuterol (VENTOLIN HFA) 108 (90 Base) MCG/ACT inhaler Inhale 2 puffs into the lungs every 4 (four) hours as needed for shortness of breath (cough). (Patient not taking: Reported on 11/05/2021) 6.7 each 0   Alcohol Swabs (B-D SINGLE USE SWABS REGULAR) PADS check blood sugar up to 1 time daily as directed (Patient not taking: Reported on 11/05/2021) 100 each 3   atorvastatin (LIPITOR) 20 MG tablet TAKE 1 TABLET(20 MG) BY MOUTH DAILY 90 tablet 1   benazepril-hydrochlorthiazide (LOTENSIN HCT) 10-12.5 MG tablet TAKE 1 TABLET BY MOUTH DAILY 90 tablet 1   betamethasone valerate (VALISONE) 0.1 % cream Apply topically 2 (two) times daily. For 2-4 weeks, apply to areas of scalp. 30 g 0   Blood Glucose Monitoring Suppl  (ACCU-CHEK AVIVA PLUS) w/Device KIT Use device to check blood sugar up to 1 time daily as directed (Patient not taking: Reported on 11/05/2021) 1 kit 0   Cholecalciferol (VITAMIN D3) 125 MCG (5000 UT) CAPS Take by mouth. (Patient not taking: Reported on 11/05/2021)     dorzolamide-timolol (COSOPT) 22.3-6.8 MG/ML ophthalmic solution dorzolamide 22.3 mg-timolol 6.8 mg/mL eye drops     glipiZIDE (GLUCOTROL XL) 5 MG 24 hr tablet TAKE 1 TABLET(5 MG) BY MOUTH DAILY WITH BREAKFAST 90 tablet 0   glucose blood test strip check blood sugar up to 1 time daily as directed (Patient not taking: Reported on 11/05/2021) 100 each 3   latanoprost (XALATAN) 0.005 % ophthalmic solution INT 1 GTT INTO OU QHS  6   levothyroxine (SYNTHROID) 75 MCG tablet Take 75 mcg by mouth.     meclizine (ANTIVERT) 25 MG tablet TAKE 1 TABLET(25 MG) BY MOUTH THREE TIMES DAILY AS NEEDED FOR DIZZINESS (Patient not taking: Reported on 11/05/2021) 60 tablet 2   metoprolol tartrate (LOPRESSOR) 25 MG tablet TAKE 1/2 TABLET(12.5 MG) BY MOUTH TWICE DAILY 90 tablet 0   naproxen (NAPROSYN) 500 MG tablet Take 1 tablet (500 mg total) by mouth 2 (two) times daily with a meal. For 2-4 weeks then as needed (Patient not taking: Reported on 11/05/2021) 60 tablet 2   nortriptyline (PAMELOR) 10  MG capsule take 30 mg nightly for one week, then increase to 40 mg nightly (Patient not taking: Reported on 11/05/2021)     omeprazole (PRILOSEC) 40 MG capsule 1 C PO QD BEFORE BREAKFAST 15-30 MINUTES BEFORE FIRST MEAL OF THE DAY     ondansetron (ZOFRAN ODT) 4 MG disintegrating tablet Take 1 tablet (4 mg total) by mouth every 8 (eight) hours as needed for nausea or vomiting. (Patient not taking: Reported on 11/05/2021) 30 tablet 2   pantoprazole (PROTONIX) 40 MG tablet Take 1 tablet (40 mg total) by mouth daily for 14 days. (Patient not taking: Reported on 11/05/2021) 14 tablet 0   sildenafil (VIAGRA) 100 MG tablet Take 1 tablet (100 mg total) by mouth daily as needed for erectile  dysfunction. Take two hours prior to intercourse on an empty stomach (Patient not taking: Reported on 11/05/2021) 30 tablet 2   sucralfate (CARAFATE) 1 g tablet Take 1 tablet (1 g total) by mouth 4 (four) times daily -  with meals and at bedtime for 5 days. (Patient not taking: Reported on 11/05/2021) 20 tablet 0   No current facility-administered medications for this visit.    Allergies  Allergen Reactions   Metformin And Related Other (See Comments)    Headache and dizzines   Metformin Hcl Other (See Comments)    headache and dizziness    Family History  Problem Relation Age of Onset   Heart disease Mother     Social History   Socioeconomic History   Marital status: Married    Spouse name: Not on file   Number of children: Not on file   Years of education: Not on file   Highest education level: 9th grade  Occupational History   Occupation: retired  Tobacco Use   Smoking status: Former    Packs/day: 1.00    Years: 32.00    Total pack years: 32.00    Types: Cigarettes    Quit date: 03/04/1991    Years since quitting: 30.7   Smokeless tobacco: Former  Scientific laboratory technician Use: Never used  Substance and Sexual Activity   Alcohol use: Not Currently    Alcohol/week: 1.0 standard drink of alcohol    Types: 1 Shots of liquor per week   Drug use: No   Sexual activity: Yes    Birth control/protection: None  Other Topics Concern   Not on file  Social History Narrative   Not on file   Social Determinants of Health   Financial Resource Strain: Low Risk  (01/01/2021)   Overall Financial Resource Strain (CARDIA)    Difficulty of Paying Living Expenses: Not hard at all  Food Insecurity: No Food Insecurity (01/01/2021)   Hunger Vital Sign    Worried About Running Out of Food in the Last Year: Never true    [place] in the Last Year: Never true  Transportation Needs: No Transportation Needs (01/01/2021)   PRAPARE - Hydrologist (Medical): No     Lack of Transportation (Non-Medical): No  Physical Activity: Insufficiently Active (01/01/2021)   Exercise Vital Sign    Days of Exercise per Week: 1 day    Minutes of Exercise per Session: 60 min  Stress: No Stress Concern Present (01/01/2021)   [place]    Feeling of Stress : Not at all  Social Connections: Somewhat Isolated (04/07/2017)   Social Connection and Isolation Panel [NHANES]  Frequency of Communication with Friends and Family: Never    Frequency of Social Gatherings with Friends and Family: Never    Attends Religious Services: 1 to 4 times per year    Active Member of [employer] or Organizations: No    Attends Archivist Meetings: Never    Marital Status: Married  Human resources officer Violence: Not At Risk (04/07/2017)   Humiliation, Afraid, Rape, and Kick questionnaire    Fear of Current or Ex-Partner: No    Emotionally Abused: No    Physically Abused: No    Sexually Abused: No     Constitutional: Denies fever, malaise, fatigue, headache or abrupt weight changes.  Respiratory: Denies difficulty breathing, shortness of breath, cough or sputum production.   Cardiovascular: Denies chest pain, chest tightness, palpitations or swelling in the hands or feet.  Gastrointestinal: Patient reports LUQ abdominal pain and diarrhea.  Denies bloating, constipation,  or blood in the stool.  GU: Denies urgency, frequency, pain with urination, burning sensation, blood in urine, odor or discharge. Musculoskeletal: Denies decrease in range of motion, difficulty with gait, muscle pain or joint pain and swelling.  Skin: Denies redness, rashes, lesions or ulcercations.   No other specific complaints in a complete review of systems (except as listed in HPI above).  Objective:   Physical Exam  BP (!) 140/82 (BP Location: Left Arm, Patient Position: Sitting, Cuff Size: Normal)   Pulse 61   Temp (!) 96.8 F (36 C) (Temporal)    Wt 159 lb (72.1 kg)   SpO2 99%   BMI 25.66 kg/m   Wt Readings from Last 3 Encounters:  11/05/21 161 lb 2 oz (73.1 kg)  09/10/21 161 lb (73 kg)  07/13/21 168 lb (76.2 kg)    General: Appears his stated age, overweight in NAD. Cardiovascular: Normal rate and rhythm. S1,S2 noted.  No murmur, rubs or gallops noted.  Pulmonary/Chest: Normal effort and positive vesicular breath sounds. No respiratory distress. No wheezes, rales or ronchi noted.  Abdomen: Soft and tender in the LUQ.  Hyperactive bowel sounds. No distention or masses noted. Liver, spleen and kidneys non palpable. Neurological: Alert and oriented.   BMET    Component Value Date/Time   NA 141 11/05/2021 1653   NA 142 06/19/2015 0908   NA 139 12/07/2012 1602   K 3.6 11/05/2021 1653   K 3.3 (L) 12/07/2012 1602   CL 103 11/05/2021 1653   CL 104 12/07/2012 1602   CO2 28 11/05/2021 1653   CO2 27 12/07/2012 1602   GLUCOSE 131 (H) 11/05/2021 1653   GLUCOSE 132 (H) 12/07/2012 1602   BUN 13 11/05/2021 1653   BUN 13 06/19/2015 0908   BUN 11 12/07/2012 1602   CREATININE 1.14 11/05/2021 1653   CREATININE 1.14 04/17/2021 1440   CALCIUM 8.5 (L) 11/05/2021 1653   CALCIUM 8.9 12/07/2012 1602   GFRNONAA >60 11/05/2021 1653   GFRNONAA 72 11/09/2019 0802   GFRAA 83 11/09/2019 0802    Lipid Panel     Component Value Date/Time   CHOL 162 11/09/2019 0802   CHOL 195 06/19/2015 0908   TRIG 170 (H) 11/09/2019 0802   HDL 49 11/09/2019 0802   HDL 52 06/19/2015 0908   CHOLHDL 3.3 11/09/2019 0802   VLDL 42 (H) 10/05/2015 1119   LDLCALC 86 11/09/2019 0802    CBC    Component Value Date/Time   WBC 4.9 07/13/2021 1505   RBC 5.13 07/13/2021 1505   HGB 15.3 07/13/2021 1505   HGB  15.2 12/07/2012 1602   HCT 46.7 07/13/2021 1505   HCT 43.8 12/07/2012 1602   PLT 232 07/13/2021 1505   PLT 194 12/07/2012 1602   MCV 91.0 07/13/2021 1505   MCV 90 12/07/2012 1602   MCH 29.8 07/13/2021 1505   MCHC 32.8 07/13/2021 1505   RDW 12.1  07/13/2021 1505   RDW 12.3 12/07/2012 1602   LYMPHSABS 3,060 11/09/2019 0802   MONOABS 0.5 11/27/2014 0921   EOSABS 120 11/09/2019 0802   BASOSABS 30 11/09/2019 0802    Hgb A1C Lab Results  Component Value Date   HGBA1C 6.0 (H) 04/17/2021           Assessment & Plan:   LUQ Abdominal Pain, Diarrhea, Hx of Diverticulosis, GERD:  Exam concerning for diverticulitis We will check CBC, c-Met, amylase, lipase Encourage clear liquid diet until symptoms improve Rx for Cipro 500 mg twice daily x10 days Rx for Flagyl 500 mg 3 times daily for 10 days  Follow-up with your PCP as previously scheduled. Webb Silversmith, NP

## 2021-11-07 NOTE — Patient Instructions (Signed)
Diverticulitis Diverticulitis  La diverticulitis ocurre cuando pequeas bolsas que se han formado en el colon (intestino grueso) se infectan o se inflaman. Esto ocasiona dolor en el vientre (abdomen) y heces acuosas (diarrea). Estas bolsas en el colon se denominan divertculos. Las bolsas se forman en las personas que tienen una afeccin llamada diverticulosis. Cules son las causas? Esta afeccin puede ser causada por materia fecal (heces) que queda atrapada en las bolsas del colon. La materia fecal permite que los microbios (bacterias) crezcan en las bolsas. Esto causa la infeccin. Qu incrementa el riesgo? Es ms probable que contraiga esta afeccin si tiene pequeas bolsas en el colon. El riesgo es mayor si: Tiene sobrepeso o mucho sobrepeso (es obeso). No realiza actividad fsica suficiente. Bebe alcohol. Fuma o consume productos que contienen tabaco. Lleva una dieta con gran cantidad de carnes rojas, como carne de vaca, cerdo o cordero. Lleva una dieta que no incluye suficiente cantidad de fibra. Es mayor de 40 aos. Cules son los signos o sntomas? Dolor en el abdomen. El dolor suele aparecer en el lado izquierdo, pero puede manifestarse en otras zonas. Fiebre y sensacin de fro. Ganas de vomitar. Vmitos. Clicos. Sensacin de estar lleno. Cambios en la frecuencia de las deposiciones. Sangre en las heces. Cmo se trata? La mayora de los casos se tratan en casa con las siguientes medidas: Tomar analgsicos de venta libre. Seguir una dieta lquida absoluta. Tomar antibiticos. Hacer reposo. Es posible que los casos muy graves deban tratarse en el hospital. Esto puede incluir: No comer ni beber nada. Tomar analgsicos recetados. Recibir antibiticos por un tubo (catter) intravenoso. Recibir lquidos y alimentos a travs de un tubo intravenoso. Someterse a una ciruga. Cuando se sienta mejor, el mdico puede indicarle que se someta a un estudio para revisar el colon  (colonoscopa). Siga estas instrucciones en su casa: Medicamentos Use los medicamentos de venta libre y los recetados solamente como se lo haya indicado el mdico. Estos incluyen lo siguiente: Antibiticos. Analgsicos. Pastillas de fibra. Probiticos. Laxantes. Si le recetaron un antibitico, tmelo como se lo haya indicado el mdico. No deje de tomar el antibitico aunque comience a sentirse mejor. Pregntele al mdico si el medicamento recetado le impide conducir o usar maquinaria. Comida y bebida  Siga una dieta como se lo haya indicado el mdico. Cuando se sienta mejor, el mdico puede indicarle que cambie la dieta. Tal vez necesite ingerir gran cantidad de fibra. La fibra facilita la evacuacin intestinal (las deposiciones). Los alimentos con fibra incluyen los siguientes: [place]. Frijoles. Lentejas. Verduras de hoja verde. Evite comer carne roja. Instrucciones generales No consuma ningn producto que contenga nicotina o tabaco, como cigarrillos, cigarrillos electrnicos y tabaco de mascar. Si necesita ayuda para dejar de consumir estos productos, consulte al mdico. Haga ejercicio 3 o ms veces por semana. Trate de hacer 30 minutos cada vez. Ejerctese lo suficiente como para transpirar y acelerar los latidos cardacos. Concurra a todas las visitas de seguimiento como se lo haya indicado el mdico. Esto es importante. Comunquese con un mdico si: El dolor no mejora. No defeca como lo hace normalmente. Solicite ayuda de inmediato si: El dolor empeora. Los sntomas no mejoran. Los sntomas empeoran con gran rapidez. Tiene fiebre. Vomita ms de una vez. Sus heces tienen las siguientes caractersticas: Contienen sangre. Son de color negro. Son alquitranadas. Resumen Esta afeccin ocurre cuando pequeas bolsas que se forman en el colon se infectan o se inflaman. Tome los medicamentos solamente como se lo haya indicado el mdico. Siga   una dieta como se lo haya indicado el  mdico. Concurra a todas las visitas de seguimiento como se lo haya indicado el mdico. Esto es importante. Esta informacin no tiene como fin reemplazar el consejo del mdico. Asegrese de hacerle al mdico cualquier pregunta que tenga. Document Revised: 03/03/2019 Document Reviewed: 03/03/2019 Elsevier Patient Education  2023 Elsevier Inc.  

## 2021-11-07 NOTE — Telephone Encounter (Addendum)
  Chief Complaint: abd pain and diarrhea continues even after being seen on 09/10/2021 by Erin Mecum.   He does not want to see her again. Symptoms: Left lower abd pain and pain in rectal area along with diarrhea.  Having diarrhea 3-4 times a day. Frequency: Abd pain in constant since last Sunday. Pertinent Negatives: Patient denies blood in stool or urinary symptoms Disposition: '[]'$ ED /'[x]'$ Urgent Care (no appt availability in office) / '[]'$ Appointment(In office/virtual)/ '[]'$  [facility]/ '[]'$ Home Care/ '[]'$ Refused Recommended Disposition /'[]'$ [facility]/ '[]'$  Follow-up with PCP Additional Notes: No appts available until 11/08/2021.   Erin Mecum, PA has an availability at 9:00 11/08/2021 but he is refusing to be seen by her again.   "She just gave me Mylanta".    Offered a [employer].  A family member got on the line to help him access MyChart but he decided he wanted to see someone not do a video visit.   I suggested the urgent care.  He was upset that he could not get in sooner and did not want to see Junie Panning Mecum again. Message sent to [facility] for their consideration on getting him in.    I also spoke with R[facility] and they are going to see what they can do to get him seen today.

## 2021-11-07 NOTE — Telephone Encounter (Signed)
Reason for Disposition  Age > 60 years  Answer Assessment - Initial Assessment Questions 1. LOCATION: "Where does it hurt?"      Having abd pain and in the rear.  In left side lower side.   Hurting in rear.  No blood. 2. RADIATION: "Does the pain shoot anywhere else?" (e.g., chest, back)     No 3. ONSET: "When did the pain begin?" (Minutes, hours or days ago)      Sunday I'm having diarrhea 3-4 times a day.    4. SUDDEN: "Gradual or sudden onset?"     My wife is also having diarrhea.     5. PATTERN "Does the pain come and go, or is it constant?"    - If it comes and goes: "How long does it last?" "Do you have pain now?"     (Note: Comes and goes means the pain is intermittent. It goes away completely between bouts.)    - If constant: "Is it getting better, staying the same, or getting worse?"      (Note: Constant means the pain never goes away completely; most serious pain is constant and gets worse.)      Constant pain 6. SEVERITY: "How bad is the pain?"  (e.g., Scale 1-10; mild, moderate, or severe)    - MILD (1-3): Doesn't interfere with normal activities, abdomen soft and not tender to touch.     - MODERATE (4-7): Interferes with normal activities or awakens from sleep, abdomen tender to touch.     - SEVERE (8-10): Excruciating pain, doubled over, unable to do any normal activities.       Unable to answer 7. RECURRENT SYMPTOM: "Have you ever had this type of stomach pain before?" If Yes, ask: "When was the last time?" and "What happened that time?"      A couple of weeks ago this happened.   I saw the dr.   She gave Mylanta. 8. CAUSE: "What do you think is causing the stomach pain?"      9. RELIEVING/AGGRAVATING FACTORS: "What makes it better or worse?" (e.g., antacids, bending or twisting motion, bowel movement)     Not tried anything else 10. OTHER SYMPTOMS: "Do you have any other symptoms?" (e.g., back pain, diarrhea, fever, urination pain, vomiting)       No urinary symptoms,  no vomiting.  Last ate yesterday.  Protocols used: Abdominal Pain - Male-A-AH

## 2021-11-07 NOTE — Telephone Encounter (Signed)
Pt was seen in the office today.    Thanks,   -Mickel Baas

## 2021-11-08 LAB — CBC
HCT: 44.5 % (ref 38.5–50.0)
Hemoglobin: 15 g/dL (ref 13.2–17.1)
MCH: 30.4 pg (ref 27.0–33.0)
MCHC: 33.7 g/dL (ref 32.0–36.0)
MCV: 90.3 fL (ref 80.0–100.0)
MPV: 10.8 fL (ref 7.5–12.5)
Platelets: 202 10*3/uL (ref 140–400)
RBC: 4.93 10*6/uL (ref 4.20–5.80)
RDW: 12.3 % (ref 11.0–15.0)
WBC: 5.5 10*3/uL (ref 3.8–10.8)

## 2021-11-08 LAB — COMPLETE METABOLIC PANEL WITH GFR
AG Ratio: 1.3 (calc) (ref 1.0–2.5)
ALT: 22 U/L (ref 9–46)
AST: 24 U/L (ref 10–35)
Albumin: 4.2 g/dL (ref 3.6–5.1)
Alkaline phosphatase (APISO): 56 U/L (ref 35–144)
BUN: 10 mg/dL (ref 7–25)
CO2: 23 mmol/L (ref 20–32)
Calcium: 8.9 mg/dL (ref 8.6–10.3)
Chloride: 104 mmol/L (ref 98–110)
Creat: 0.99 mg/dL (ref 0.70–1.28)
Globulin: 3.2 g/dL (calc) (ref 1.9–3.7)
Glucose, Bld: 95 mg/dL (ref 65–99)
Potassium: 3.7 mmol/L (ref 3.5–5.3)
Sodium: 138 mmol/L (ref 135–146)
Total Bilirubin: 0.7 mg/dL (ref 0.2–1.2)
Total Protein: 7.4 g/dL (ref 6.1–8.1)
eGFR: 82 mL/min/{1.73_m2} (ref >=60)

## 2021-11-08 LAB — LIPASE: Lipase: 26 U/L (ref 7–60)

## 2021-11-08 LAB — AMYLASE: Amylase: 52 U/L (ref 21–101)

## 2021-11-15 ENCOUNTER — Telehealth (HOSPITAL_COMMUNITY): Payer: Self-pay | Admitting: *Deleted

## 2021-11-15 NOTE — Telephone Encounter (Signed)
Attempted to call patient regarding upcoming cardiac CT appointment using a [place] interpreter, [place] ID [id number]. Left message on voicemail with name and callback number  Gordy Clement RN Navigator Cardiac [facility] [phone] Office [phone] Cell

## 2021-11-18 ENCOUNTER — Ambulatory Visit
Admission: RE | Admit: 2021-11-18 | Discharge: 2021-11-18 | Disposition: A | Discharge status: Home / Self Care | Payer: Medicare Other | Source: Ambulatory Visit | Attending: Cardiovascular Disease | Admitting: Cardiovascular Disease

## 2021-11-18 DIAGNOSIS — I209 Angina pectoris, unspecified: Secondary | ICD-10-CM | POA: Diagnosis not present

## 2021-11-18 DIAGNOSIS — R072 Precordial pain: Secondary | ICD-10-CM | POA: Insufficient documentation

## 2021-11-18 DIAGNOSIS — R079 Chest pain, unspecified: Secondary | ICD-10-CM | POA: Insufficient documentation

## 2021-11-18 MED ORDER — IOHEXOL 350 MG/ML SOLN
100.0000 mL | Freq: Once | INTRAVENOUS | Status: AC | PRN
  Administered 2021-11-18: 100 mL via INTRAVENOUS

## 2021-11-18 MED ORDER — NITROGLYCERIN 0.4 MG SL SUBL
0.8000 mg | SUBLINGUAL_TABLET | Freq: Once | SUBLINGUAL | Status: AC
  Administered 2021-11-18: 0.8 mg via SUBLINGUAL

## 2021-11-18 NOTE — Progress Notes (Signed)
Patient tolerated procedure well. Ambulate w/o difficulty. Denies any lightheadedness or being dizzy. Pt denies any pain at this time. Sitting in chair, pt is encouraged to drink additional water throughout the day and reason explained to patient. Patient verbalized understanding and all questions answered. ABC intact. No further needs at this time. Discharge from procedure area w/o issues.  

## 2021-11-19 ENCOUNTER — Ambulatory Visit
Admission: RE | Admit: 2021-11-19 | Discharge: 2021-11-19 | Disposition: A | Discharge status: Home / Self Care | Payer: Medicare Other | Source: Ambulatory Visit | Attending: Cardiology | Admitting: Cardiology

## 2021-11-19 ENCOUNTER — Other Ambulatory Visit: Payer: Self-pay | Admitting: Cardiology

## 2021-11-19 DIAGNOSIS — R931 Abnormal findings on diagnostic imaging of heart and coronary circulation: Secondary | ICD-10-CM

## 2021-11-20 ENCOUNTER — Telehealth: Payer: Self-pay

## 2021-11-20 DIAGNOSIS — E1169 Type 2 diabetes mellitus with other specified complication: Secondary | ICD-10-CM

## 2021-11-20 MED ORDER — ATORVASTATIN CALCIUM 40 MG PO TABS: 40.0000 mg | ORAL_TABLET | Freq: Every day | ORAL | 1 refills | Status: DC

## 2021-11-20 MED ORDER — EZETIMIBE 10 MG PO TABS: 10.0000 mg | ORAL_TABLET | Freq: Every day | ORAL | 3 refills | Status: DC

## 2021-11-20 NOTE — Telephone Encounter (Signed)
Called patient and informed him and his daughter of the result note below. Both verbalized understanding and agreed with plan.

## 2021-11-20 NOTE — Telephone Encounter (Signed)
-----   Message from Minna Merritts, MD sent at 11/19/2021  7:53 AM EDT ----- Cardiac CTA Coronary calcium score of 265. This was 61st percentile for age and sex matched control. 2. Normal coronary origin with left dominance. 3. Moderate stenosis (50%) in the proximal LAD. 4. Mild stenosis in the proximal RCA.  No further testing needed as blockage is moderate and would not warrant a stent. Given CAD on CT scan, we need more aggressive lipid management Would increase atorvastatin up to 40 mg daily, add zetia 10 mg daily. Goal LDL <55 (recently >90)

## 2021-12-03 DIAGNOSIS — D320 Benign neoplasm of cerebral meninges: Secondary | ICD-10-CM | POA: Diagnosis not present

## 2021-12-03 DIAGNOSIS — D329 Benign neoplasm of meninges, unspecified: Secondary | ICD-10-CM | POA: Diagnosis not present

## 2021-12-10 ENCOUNTER — Ambulatory Visit (INDEPENDENT_AMBULATORY_CARE_PROVIDER_SITE_OTHER): Payer: Medicare Other | Admitting: Internal Medicine

## 2021-12-10 ENCOUNTER — Encounter: Payer: Self-pay | Admitting: Internal Medicine

## 2021-12-10 VITALS — BP 136/86 | HR 60 | Temp 96.9°F | Wt 158.0 lb

## 2021-12-10 DIAGNOSIS — J069 Acute upper respiratory infection, unspecified: Secondary | ICD-10-CM

## 2021-12-10 MED ORDER — PROMETHAZINE-DM 6.25-15 MG/5ML PO SYRP: 5.0000 mL | ORAL_SOLUTION | Freq: Three times a day (TID) | ORAL | 0 refills | Status: DC | PRN

## 2021-12-10 MED ORDER — PREDNISONE 10 MG PO TABS: ORAL_TABLET | ORAL | 0 refills | Status: DC

## 2021-12-10 NOTE — Addendum Note (Signed)
Addended by: Ashley Royalty E on: 12/10/2021 02:22 PM   Modules accepted: Orders

## 2021-12-10 NOTE — Patient Instructions (Signed)
Infeccin de las vas respiratorias superiores en adultos Upper Respiratory Infection, Adult Una infeccin de las vas respiratorias superiores (IVRS) afecta la nariz, la garganta y las vas respiratorias superiores que llegan a los pulmones. El tipo ms comn de IVRS suele conocerse como el resfro comn. Las IVRS generalmente mejoran solas, sin tratamiento mdico. Cules son las causas? La causa de las IVRS es un germen (virus). Puede contraer estos grmenes de las siguientes maneras: Al aspirar las gotitas que una persona infectada elimina al toser o estornudar. Al tocar algo que tiene el germen (est contaminado) y luego tocarse la boca, la nariz o los ojos. Qu incrementa el riesgo? Es ms propenso a contraer una IVRS si: Es muy pequeo o de edad muy avanzada. Tiene contacto cercano con otros, como en el trabajo, la escuela o un centro de atencin mdica. Fuma. Tiene una enfermedad cardaca o pulmonar a largo plazo (crnica). Tiene debilitado el sistema encargado de combatir las enfermedades (sistema inmunitario). Tiene asma o alergias nasales. Tiene mucho estrs. Tiene un dficit nutricional. Cules son los signos o sntomas? Secrecin nasal o nariz tapada (congestin). Tos. Estornudos. Dolor de garganta. Dolor de cabeza. Sensacin de cansancio (fatiga). Fiebre. No querer comer tanto como lo hace habitualmente. Dolor en la frente, detrs de los ojos y por encima de los pmulos (dolor sinusal). Dolores musculares. Enrojecimiento o irritacin de los ojos. Presin en los odos o la cara. Cmo se trata? Las IVRS generalmente mejoran por s solas en un perodo de entre 7 y 10 das. Los medicamentos no curan las IVRS, pero el mdico puede recomendarle ciertos medicamentos para ayudar a aliviar los sntomas, como por ejemplo: Medicamentos para la tos de venta libre. Medicamentos para reducir la tos (antitusivos). La tos es un tipo de defensa contra las infecciones que ayuda a  despejar la nariz, la garganta, la trquea y los pulmones (el sistema respiratorio). Tome estos medicamentos solamente como se lo haya indicado el mdico. Medicamentos para bajar la fiebre. Siga estas instrucciones en su casa: Actividad Descanse todo lo que sea necesario. Si tiene fiebre, permanezca en su casa, sin ir al trabajo o a la escuela, hasta que ya no tenga fiebre, o hasta que el mdico le indique que puede regresar al trabajo o a la escuela. Debe permanecer en su casa hasta que ya no pueda propagar (contagiar) la infeccin. Es posible que el mdico le indique que use una mascarilla para tener menos riesgo de propagar la infeccin. Para aliviar los sntomas Enjuguese la boca frecuentemente con una mezcla de agua con sal. Para preparar agua con sal, disuelva de  a 1 cucharadita (de 3 a 6 g) de sal en 1 taza (237 ml) de agua tibia. Use un humidificador de aire fro para agregar humedad al aire. Esto puede ayudarlo a que respire mejor. Comida y bebida  Beba suficiente lquido para mantener la orina de color amarillo plido. Tome sopas y caldos transparentes. Instrucciones generales  Use los medicamentos de venta libre y los recetados solamente como se lo haya indicado el mdico. No fume ni consuma ningn producto que contenga nicotina o tabaco. Si necesita ayuda para dejar de fumar, consulte al mdico. Evite estar cerca de personas que fuman (evite el humo ambiental de tabaco). Mantngase al da con todas las vacunas (inmunizaciones) y aplquese la vacuna contra la gripe todos los aos. Concurra a todas las visitas de seguimiento. Cmo evitar contagiar la infeccin a otros  Lvese las manos con agua y jabn durante al menos 20   segundos. Use un desinfectante para manos si no dispone de agua y jabn. Evite tocarse la boca, la cara, los ojos o la nariz. Tosa o estornude en un pauelo de papel o sobre su manga o codo. No tosa o estornude al aire ni se cubra la boca o la nariz con la  mano. Comunquese con un mdico si: Siente que empeora o que no mejora. Tiene alguno de estos sntomas: Fiebre o escalofros. Mucosidad color marrn o roja en la nariz. Lquido amarillento o amarronado (secrecin)que le sale de la nariz. Dolor en la cara, especialmente al inclinarse hacia adelante. Ganglios del cuello inflamados. Dolor al tragar. Zonas blancas en la parte de atrs de la garganta. Solicite ayuda de inmediato si: La falta de aire empeora. Los siguientes sntomas son muy intensos o constantes: Dolor de cabeza. Dolor de odo. Dolor en la frente, detrs de los ojos y por encima de los pmulos (dolor sinusal). Dolor de pecho. Tiene una enfermedad pulmonar prolongada (crnica) junto con cualquiera de estos sntomas: Emitir sonidos de silbidos agudos al respirar, ms a menudo al exhalar (sibilancias). Tos prolongada (ms de 14 das). Tos con sangre. Cambio en la mucosidad habitual. Tiene rigidez en el cuello. Tiene cambios en: La visin. La audicin. El razonamiento. El estado de nimo. Estos sntomas pueden indicar una emergencia. Solicite ayuda de inmediato. Llame al 911. No espere a ver si los sntomas desaparecen. No conduzca por sus propios medios hasta el hospital. Resumen Una infeccin de las vas respiratorias superiores (IVRS) es causada por un germen (virus). El tipo ms comn de IVRS suele conocerse como el resfro comn. Una IVRS suele mejorar en el transcurso de 7 a 10 das. Use los medicamentos de venta libre y los recetados solamente como se lo haya indicado el mdico. Esta informacin no tiene como fin reemplazar el consejo del mdico. Asegrese de hacerle al mdico cualquier pregunta que tenga. Document Revised: 10/15/2020 Document Reviewed: 10/15/2020 Elsevier Patient Education  2023 Elsevier Inc.  

## 2021-12-10 NOTE — Progress Notes (Signed)
Subjective:    Patient ID: Ross Riley, male    DOB: 20-Aug-1950, 71 y.o.   MRN: [id number]  HPI  Patient presents to clinic today with complaint of headache, runny nose, sore throat and cough.  This started 5 days ago.  The headache is generalized.  He describes the pain as pressure.  He is blowing clear mucus out of his nose.  He denies difficulty swallowing.  The cough is dry nonproductive.  He denies nasal congestion, ear pain, shortness of breath, chest pain, nausea, vomiting or diarrhea.  He denies fever, chills or body aches.  He has tried medication OTC but does not remember the name of it.  He has not had sick contacts that he is aware of.  He did not take a COVID test.  Review of Systems  Past Medical History:  Diagnosis Date   Arthritis    BPH (benign prostatic hyperplasia)    Chronic kidney disease    kidney stones   Dystonia    ED (erectile dysfunction)    GERD (gastroesophageal reflux disease)    Glaucoma (increased eye pressure)    Hematuria    Hemorrhoids    Hyperlipidemia    Hypertension    Thyroid disease     Current Outpatient Medications  Medication Sig Dispense Refill   ACCU-CHEK FASTCLIX LANCETS MISC check blood sugar up to 1 time daily as directed (Patient not taking: Reported on 11/05/2021) 102 each 3   albuterol (VENTOLIN HFA) 108 (90 Base) MCG/ACT inhaler Inhale 2 puffs into the lungs every 4 (four) hours as needed for shortness of breath (cough). (Patient not taking: Reported on 11/05/2021) 6.7 each 0   Alcohol Swabs (B-D SINGLE USE SWABS REGULAR) PADS check blood sugar up to 1 time daily as directed (Patient not taking: Reported on 11/05/2021) 100 each 3   atorvastatin (LIPITOR) 40 MG tablet Take 1 tablet (40 mg total) by mouth daily. 90 tablet 1   benazepril-hydrochlorthiazide (LOTENSIN HCT) 10-12.5 MG tablet TAKE 1 TABLET BY MOUTH DAILY 90 tablet 1   betamethasone valerate (VALISONE) 0.1 % cream Apply topically 2 (two) times daily. For 2-4 weeks, apply  to areas of scalp. 30 g 0   Blood Glucose Monitoring Suppl (ACCU-CHEK AVIVA PLUS) w/Device KIT Use device to check blood sugar up to 1 time daily as directed (Patient not taking: Reported on 11/05/2021) 1 kit 0   Cholecalciferol (VITAMIN D3) 125 MCG (5000 UT) CAPS Take by mouth. (Patient not taking: Reported on 11/05/2021)     dorzolamide-timolol (COSOPT) 22.3-6.8 MG/ML ophthalmic solution dorzolamide 22.3 mg-timolol 6.8 mg/mL eye drops     ezetimibe (ZETIA) 10 MG tablet Take 1 tablet (10 mg total) by mouth daily. 30 tablet 3   glipiZIDE (GLUCOTROL XL) 5 MG 24 hr tablet TAKE 1 TABLET(5 MG) BY MOUTH DAILY WITH BREAKFAST 90 tablet 0   glucose blood test strip check blood sugar up to 1 time daily as directed (Patient not taking: Reported on 11/05/2021) 100 each 3   latanoprost (XALATAN) 0.005 % ophthalmic solution INT 1 GTT INTO OU QHS  6   levothyroxine (SYNTHROID) 75 MCG tablet Take 75 mcg by mouth.     meclizine (ANTIVERT) 25 MG tablet TAKE 1 TABLET(25 MG) BY MOUTH THREE TIMES DAILY AS NEEDED FOR DIZZINESS (Patient not taking: Reported on 11/05/2021) 60 tablet 2   metoprolol tartrate (LOPRESSOR) 25 MG tablet TAKE 1/2 TABLET(12.5 MG) BY MOUTH TWICE DAILY 90 tablet 0   naproxen (NAPROSYN) 500 MG tablet Take  1 tablet (500 mg total) by mouth 2 (two) times daily with a meal. For 2-4 weeks then as needed 60 tablet 2   nortriptyline (PAMELOR) 10 MG capsule take 30 mg nightly for one week, then increase to 40 mg nightly (Patient not taking: Reported on 11/05/2021)     omeprazole (PRILOSEC) 40 MG capsule 1 C PO QD BEFORE BREAKFAST 15-30 MINUTES BEFORE FIRST MEAL OF THE DAY     ondansetron (ZOFRAN ODT) 4 MG disintegrating tablet Take 1 tablet (4 mg total) by mouth every 8 (eight) hours as needed for nausea or vomiting. (Patient not taking: Reported on 11/05/2021) 30 tablet 2   pantoprazole (PROTONIX) 40 MG tablet Take 1 tablet (40 mg total) by mouth daily for 14 days. (Patient not taking: Reported on 11/05/2021) 14 tablet 0    sildenafil (VIAGRA) 100 MG tablet Take 1 tablet (100 mg total) by mouth daily as needed for erectile dysfunction. Take two hours prior to intercourse on an empty stomach (Patient not taking: Reported on 11/05/2021) 30 tablet 2   sucralfate (CARAFATE) 1 g tablet Take 1 tablet (1 g total) by mouth 4 (four) times daily -  with meals and at bedtime for 5 days. (Patient not taking: Reported on 11/05/2021) 20 tablet 0   No current facility-administered medications for this visit.    Allergies  Allergen Reactions   Metformin And Related Other (See Comments)    Headache and dizzines   Metformin Hcl Other (See Comments)    headache and dizziness    Family History  Problem Relation Age of Onset   Heart disease Mother     Social History   Socioeconomic History   Marital status: Married    Spouse name: Not on file   Number of children: Not on file   Years of education: Not on file   Highest education level: 9th grade  Occupational History   Occupation: retired  Tobacco Use   Smoking status: Former    Packs/day: 1.00    Years: 32.00    Total pack years: 32.00    Types: Cigarettes    Quit date: 03/04/1991    Years since quitting: 30.7   Smokeless tobacco: Former  Scientific laboratory technician Use: Never used  Substance and Sexual Activity   Alcohol use: Not Currently    Alcohol/week: 1.0 standard drink of alcohol    Types: 1 Shots of liquor per week   Drug use: No   Sexual activity: Yes    Birth control/protection: None  Other Topics Concern   Not on file  Social History Narrative   Not on file   Social Determinants of Health   Financial Resource Strain: Low Risk  (01/01/2021)   Overall Financial Resource Strain (CARDIA)    Difficulty of Paying Living Expenses: Not hard at all  Food Insecurity: No Food Insecurity (01/01/2021)   Hunger Vital Sign    Worried About Running Out of Food in the Last Year: Never true    [place]idas in the Last Year: Never true  Transportation Needs: No  Transportation Needs (01/01/2021)   PRAPARE - Hydrologist (Medical): No    Lack of Transportation (Non-Medical): No  Physical Activity: Insufficiently Active (01/01/2021)   Exercise Vital Sign    Days of Exercise per Week: 1 day    Minutes of Exercise per Session: 60 min  Stress: No Stress Concern Present (01/01/2021)   Pink  Stress Questionnaire    Feeling of Stress : Not at all  Social Connections: Somewhat Isolated (04/07/2017)   Social Connection and Isolation Panel [NHANES]    Frequency of Communication with Friends and Family: Never    Frequency of Social Gatherings with Friends and Family: Never    Attends Religious Services: 1 to 4 times per year    Active Member of [employer] or Organizations: No    Attends Archivist Meetings: Never    Marital Status: Married  Human resources officer Violence: Not At Risk (04/07/2017)   Humiliation, Afraid, Rape, and Kick questionnaire    Fear of Current or Ex-Partner: No    Emotionally Abused: No    Physically Abused: No    Sexually Abused: No     Constitutional: Patient reports headache.  Denies fever, malaise, fatigue, or abrupt weight changes.  HEENT: Patient reports runny nose and sore throat.  Denies eye pain, eye redness, ear pain, ringing in the ears, wax buildup, nasal congestion, bloody nose. Respiratory: Patient reports cough.  Denies difficulty breathing, shortness of breath, or sputum production.   Cardiovascular: Denies chest pain, chest tightness, palpitations or swelling in the hands or feet.  Gastrointestinal: Denies abdominal pain, bloating, constipation, diarrhea or blood in the stool.   No other specific complaints in a complete review of systems (except as listed in HPI above).     Objective:   Physical Exam  BP 136/86 (BP Location: Left Arm, Patient Position: Sitting, Cuff Size: Normal)   Pulse 60   Temp (!) 96.9 F (36.1 C) (Temporal)    Wt 158 lb (71.7 kg)   SpO2 99%   BMI 25.50 kg/m   Wt Readings from Last 3 Encounters:  11/07/21 159 lb (72.1 kg)  11/05/21 161 lb 2 oz (73.1 kg)  09/10/21 161 lb (73 kg)    General: Appears his stated age, appears unwell but in NAD. Skin: Clammy.  No rashes noted. HEENT: Head: normal shape and size, no sinus tenderness noted; Eyes: sclera white, no icterus, conjunctiva pink, PERRLA and EOMs intact; Throat/Mouth: Teeth present, mucosa pink and moist, no exudate, lesions or ulcerations noted.  Neck: No adenopathy noted. Cardiovascular: Normal rate and rhythm. S1,S2 noted.  No murmur, rubs or gallops noted. No JVD or BLE edema. No carotid bruits noted. Pulmonary/Chest: Normal effort and positive vesicular breath sounds. No respiratory distress. No wheezes, rales or ronchi noted.  Neurological: Alert and oriented.   BMET    Component Value Date/Time   NA 138 11/07/2021 1044   NA 142 06/19/2015 0908   NA 139 12/07/2012 1602   K 3.7 11/07/2021 1044   K 3.3 (L) 12/07/2012 1602   CL 104 11/07/2021 1044   CL 104 12/07/2012 1602   CO2 23 11/07/2021 1044   CO2 27 12/07/2012 1602   GLUCOSE 95 11/07/2021 1044   GLUCOSE 132 (H) 12/07/2012 1602   BUN 10 11/07/2021 1044   BUN 13 06/19/2015 0908   BUN 11 12/07/2012 1602   CREATININE 0.99 11/07/2021 1044   CALCIUM 8.9 11/07/2021 1044   CALCIUM 8.9 12/07/2012 1602   GFRNONAA >60 11/05/2021 1653   GFRNONAA 72 11/09/2019 0802   GFRAA 83 11/09/2019 0802    Lipid Panel     Component Value Date/Time   CHOL 162 11/09/2019 0802   CHOL 195 06/19/2015 0908   TRIG 170 (H) 11/09/2019 0802   HDL 49 11/09/2019 0802   HDL 52 06/19/2015 0908   CHOLHDL 3.3 11/09/2019 0802   VLDL  42 (H) 10/05/2015 1119   LDLCALC 86 11/09/2019 0802    CBC    Component Value Date/Time   WBC 5.5 11/07/2021 1044   RBC 4.93 11/07/2021 1044   HGB 15.0 11/07/2021 1044   HGB 15.2 12/07/2012 1602   HCT 44.5 11/07/2021 1044   HCT 43.8 12/07/2012 1602   PLT 202  11/07/2021 1044   PLT 194 12/07/2012 1602   MCV 90.3 11/07/2021 1044   MCV 90 12/07/2012 1602   MCH 30.4 11/07/2021 1044   MCHC 33.7 11/07/2021 1044   RDW 12.3 11/07/2021 1044   RDW 12.3 12/07/2012 1602   LYMPHSABS 3,060 11/09/2019 0802   MONOABS 0.5 11/27/2014 0921   EOSABS 120 11/09/2019 0802   BASOSABS 30 11/09/2019 0802    Hgb A1C Lab Results  Component Value Date   HGBA1C 6.0 (H) 04/17/2021           Assessment & Plan:   Viral URI with Cough:  We will check COVID/flu/RSV Encourage rest and fluids Rx for Prednisone taper for symptom management Rx for Promethazine DM for cough-sedation caution given  Follow-up with your PCP as previously scheduled Webb Silversmith, NP

## 2021-12-12 LAB — SARS-COV-2 RNA, INFLUENZA A/B, AND RSV RNA, QUALITATIVE NAAT
INFLUENZA A RNA: NOT DETECTED
INFLUENZA B RNA: NOT DETECTED
RSV RNA: NOT DETECTED
SARS COV2 RNA: NOT DETECTED

## 2021-12-16 DIAGNOSIS — G249 Dystonia, unspecified: Secondary | ICD-10-CM | POA: Diagnosis not present

## 2021-12-16 DIAGNOSIS — M24572 Contracture, left ankle: Secondary | ICD-10-CM | POA: Diagnosis not present

## 2021-12-16 DIAGNOSIS — M216X2 Other acquired deformities of left foot: Secondary | ICD-10-CM | POA: Diagnosis not present

## 2021-12-16 DIAGNOSIS — E119 Type 2 diabetes mellitus without complications: Secondary | ICD-10-CM | POA: Diagnosis not present

## 2021-12-16 DIAGNOSIS — M19072 Primary osteoarthritis, left ankle and foot: Secondary | ICD-10-CM | POA: Diagnosis not present

## 2021-12-18 ENCOUNTER — Other Ambulatory Visit: Payer: Self-pay | Admitting: Family Medicine

## 2021-12-18 DIAGNOSIS — I10 Essential (primary) hypertension: Secondary | ICD-10-CM

## 2021-12-18 DIAGNOSIS — E1136 Type 2 diabetes mellitus with diabetic cataract: Secondary | ICD-10-CM

## 2021-12-18 DIAGNOSIS — E1169 Type 2 diabetes mellitus with other specified complication: Secondary | ICD-10-CM

## 2021-12-18 NOTE — Telephone Encounter (Signed)
Requested medication (s) are due for refill today: yes  Requested medication (s) are on the active medication list: yes  Last refill:  09/24/21  #90  Future visit scheduled: no  Notes to clinic:  overdue lab work   Requested Prescriptions  Pending Prescriptions Disp Refills   glipiZIDE (GLUCOTROL XL) 5 MG 24 hr tablet [Pharmacy Med Name: GLIPIZIDE ER '5MG'$  TABLETS] 90 tablet     Sig: TAKE 1 TABLET(5 MG) BY MOUTH DAILY WITH BREAKFAST     Endocrinology:  Diabetes - Sulfonylureas Failed - 12/18/2021 10:04 AM      Failed - HBA1C is between 0 and 7.9 and within 180 days    Hemoglobin A1C  Date Value Ref Range Status  12/26/2020 6.8  Final   Hgb A1c MFr Bld  Date Value Ref Range Status  04/17/2021 6.0 (H) <5.7 % of total Hgb Final    Comment:    For someone without known diabetes, a hemoglobin  A1c value between 5.7% and 6.4% is consistent with prediabetes and should be confirmed with a  follow-up test. . For someone with known diabetes, a value <7% indicates that their diabetes is well controlled. A1c targets should be individualized based on duration of diabetes, age, comorbid conditions, and other considerations. . This assay result is consistent with an increased risk of diabetes. . Currently, no consensus exists regarding use of hemoglobin A1c for diagnosis of diabetes for children. .          Passed - Cr in normal range and within 360 days    Creat  Date Value Ref Range Status  11/07/2021 0.99 0.70 - 1.28 mg/dL Final   Creatinine, POC  Date Value Ref Range Status  06/18/2015 0 mg/dL Final         Passed - Valid encounter within last 6 months    Recent Outpatient Visits           1 week ago Viral URI with cough   [facility] [place], Coralie Keens, NP   1 month ago LUQ pain   [facility] [place], Coralie Keens, NP   3 months ago Diarrhea, unspecified type   [place], PA-C   6 months ago Arthritis of left  hip   [place], DO   8 months ago Annual physical exam   [place], DO              Signed Prescriptions Disp Refills   metoprolol tartrate (LOPRESSOR) 25 MG tablet 90 tablet 0    Sig: TAKE 1/2 TABLET(12.5 MG) BY MOUTH TWICE DAILY     Cardiovascular:  Beta Blockers Passed - 12/18/2021 10:04 AM      Passed - Last BP in normal range    BP Readings from Last 1 Encounters:  12/10/21 136/86         Passed - Last Heart Rate in normal range    Pulse Readings from Last 1 Encounters:  12/10/21 60         Passed - Valid encounter within last 6 months    Recent Outpatient Visits           1 week ago Viral URI with cough   [place], NP   1 month ago LUQ pain   [place], [place], NP   3 months ago Diarrhea, unspecified type  [facility] [place], Dani [place], PA-C   6 months ago Arthritis of left hip   [place], DO   8 months ago Annual physical exam   [place], DO              Refused Prescriptions Disp Refills   atorvastatin (LIPITOR) 20 MG tablet [Pharmacy Med Name: ATORVASTATIN '20MG'$  TABLETS] 90 tablet     Sig: TAKE 1 TABLET(20 MG) BY MOUTH DAILY     Cardiovascular:  Antilipid - Statins Failed - 12/18/2021 10:04 AM      Failed - Lipid Panel in normal range within the last 12 months    Cholesterol, Total  Date Value Ref Range Status  06/19/2015 195 100 - 199 mg/dL Final   Cholesterol  Date Value Ref Range Status  11/09/2019 162 <200 mg/dL Final   LDL Cholesterol (Calc)  Date Value Ref Range Status  11/09/2019 86 mg/dL (calc) Final    Comment:    Reference range: <100 . Desirable range <100 mg/dL for primary prevention;   <70 mg/dL for patients with CHD or diabetic patients  with > or = 2 CHD risk factors. Marland Kitchen LDL-C  is now calculated using the Martin-Hopkins  calculation, which is a validated novel method providing  better accuracy than the Friedewald equation in the  estimation of LDL-C.  Cresenciano Genre et al. Annamaria Helling. 9798;921(19): 2061-2068  (http://education.[employer].com/faq/FAQ164)    HDL  Date Value Ref Range Status  11/09/2019 49 > OR = 40 mg/dL Final  06/19/2015 52 >39 mg/dL Final   Triglycerides  Date Value Ref Range Status  11/09/2019 170 (H) <150 mg/dL Final         Passed - Patient is not pregnant      Passed - Valid encounter within last 12 months    Recent Outpatient Visits           1 week ago Viral URI with cough   [place], NP   1 month ago LUQ pain   [facility] [place], Coralie Keens, NP   3 months ago Diarrhea, unspecified type   [place], PA-C   6 months ago Arthritis of left hip   [place], DO   8 months ago Annual physical exam   [place], Devonne Doughty, DO

## 2021-12-18 NOTE — Telephone Encounter (Signed)
Requested Prescriptions  Pending Prescriptions Disp Refills  . metoprolol tartrate (LOPRESSOR) 25 MG tablet [Pharmacy Med Name: METOPROLOL TARTRATE '25MG'$  TABLETS] 90 tablet 0    Sig: TAKE 1/2 TABLET(12.5 MG) BY MOUTH TWICE DAILY     Cardiovascular:  Beta Blockers Passed - 12/18/2021 10:04 AM      Passed - Last BP in normal range    BP Readings from Last 1 Encounters:  12/10/21 136/86         Passed - Last Heart Rate in normal range    Pulse Readings from Last 1 Encounters:  12/10/21 60         Passed - Valid encounter within last 6 months    Recent Outpatient Visits          1 week ago Viral URI with cough   [facility] [place], Coralie Keens, NP   1 month ago LUQ pain   [facility] [place], Coralie Keens, NP   3 months ago Diarrhea, unspecified type   [place], PA-C   6 months ago Arthritis of left hip   [place], DO   8 months ago Annual physical exam   [facility], Devonne Doughty, DO             . glipiZIDE (GLUCOTROL XL) 5 MG 24 hr tablet [Pharmacy Med Name: GLIPIZIDE ER '5MG'$  TABLETS] 90 tablet     Sig: TAKE 1 TABLET(5 MG) BY MOUTH DAILY WITH BREAKFAST     Endocrinology:  Diabetes - Sulfonylureas Failed - 12/18/2021 10:04 AM      Failed - HBA1C is between 0 and 7.9 and within 180 days    Hemoglobin A1C  Date Value Ref Range Status  12/26/2020 6.8  Final   Hgb A1c MFr Bld  Date Value Ref Range Status  04/17/2021 6.0 (H) <5.7 % of total Hgb Final    Comment:    For someone without known diabetes, a hemoglobin  A1c value between 5.7% and 6.4% is consistent with prediabetes and should be confirmed with a  follow-up test. . For someone with known diabetes, a value <7% indicates that their diabetes is well controlled. A1c targets should be individualized based on duration of diabetes, age, comorbid conditions, and other considerations. . This  assay result is consistent with an increased risk of diabetes. . Currently, no consensus exists regarding use of hemoglobin A1c for diagnosis of diabetes for children. .          Passed - Cr in normal range and within 360 days    Creat  Date Value Ref Range Status  11/07/2021 0.99 0.70 - 1.28 mg/dL Final   Creatinine, POC  Date Value Ref Range Status  06/18/2015 0 mg/dL Final         Passed - Valid encounter within last 6 months    Recent Outpatient Visits          1 week ago Viral URI with cough   [place], NP   1 month ago LUQ pain   [facility] [place], Coralie Keens, NP   3 months ago Diarrhea, unspecified type   [place], PA-C   6 months ago Arthritis of left hip   [place], DO   8 months ago Annual physical exam   [facility],  Devonne Doughty, DO             . atorvastatin (LIPITOR) 20 MG tablet [Pharmacy Med Name: ATORVASTATIN '20MG'$  TABLETS] 90 tablet     Sig: TAKE 1 TABLET(20 MG) BY MOUTH DAILY     Cardiovascular:  Antilipid - Statins Failed - 12/18/2021 10:04 AM      Failed - Lipid Panel in normal range within the last 12 months    Cholesterol, Total  Date Value Ref Range Status  06/19/2015 195 100 - 199 mg/dL Final   Cholesterol  Date Value Ref Range Status  11/09/2019 162 <200 mg/dL Final   LDL Cholesterol (Calc)  Date Value Ref Range Status  11/09/2019 86 mg/dL (calc) Final    Comment:    Reference range: <100 . Desirable range <100 mg/dL for primary prevention;   <70 mg/dL for patients with CHD or diabetic patients  with > or = 2 CHD risk factors. Marland Kitchen LDL-C is now calculated using the Martin-Hopkins  calculation, which is a validated novel method providing  better accuracy than the Friedewald equation in the  estimation of LDL-C.  Cresenciano Genre et al. Annamaria Helling. 6269;485(46): 2061-2068   (http://education.QuestDiagnostics.com/faq/FAQ164)    HDL  Date Value Ref Range Status  11/09/2019 49 > OR = 40 mg/dL Final  06/19/2015 52 >39 mg/dL Final   Triglycerides  Date Value Ref Range Status  11/09/2019 170 (H) <150 mg/dL Final         Passed - Patient is not pregnant      Passed - Valid encounter within last 12 months    Recent Outpatient Visits          1 week ago Viral URI with cough   [place], NP   1 month ago LUQ pain   [facility] [place], Coralie Keens, NP   3 months ago Diarrhea, unspecified type   [place], PA-C   6 months ago Arthritis of left hip   [place], DO   8 months ago Annual physical exam   [place], Devonne Doughty, DO

## 2021-12-19 ENCOUNTER — Other Ambulatory Visit: Payer: Self-pay | Admitting: Family Medicine

## 2021-12-19 DIAGNOSIS — I10 Essential (primary) hypertension: Secondary | ICD-10-CM

## 2021-12-19 NOTE — Telephone Encounter (Signed)
Requested Prescriptions  Pending Prescriptions Disp Refills  . metoprolol tartrate (LOPRESSOR) 25 MG tablet [Pharmacy Med Name: METOPROLOL TARTRATE '25MG'$  TABLETS] 90 tablet 0    Sig: TAKE 1/2 TABLET(12.5 MG) BY MOUTH TWICE DAILY     Cardiovascular:  Beta Blockers Passed - 12/19/2021 11:48 AM      Passed - Last BP in normal range    BP Readings from Last 1 Encounters:  12/10/21 136/86         Passed - Last Heart Rate in normal range    Pulse Readings from Last 1 Encounters:  12/10/21 60         Passed - Valid encounter within last 6 months    Recent Outpatient Visits          1 week ago Viral URI with cough   [place], NP   1 month ago LUQ pain   [facility] [place], Coralie Keens, NP   3 months ago Diarrhea, unspecified type   [place], PA-C   6 months ago Arthritis of left hip   [place], DO   8 months ago Annual physical exam   [place], Devonne Doughty, DO

## 2021-12-26 DIAGNOSIS — Z7984 Long term (current) use of oral hypoglycemic drugs: Secondary | ICD-10-CM | POA: Diagnosis not present

## 2021-12-26 DIAGNOSIS — E039 Hypothyroidism, unspecified: Secondary | ICD-10-CM | POA: Diagnosis not present

## 2021-12-26 DIAGNOSIS — G8918 Other acute postprocedural pain: Secondary | ICD-10-CM | POA: Diagnosis not present

## 2021-12-26 DIAGNOSIS — Z87891 Personal history of nicotine dependence: Secondary | ICD-10-CM | POA: Diagnosis not present

## 2021-12-26 DIAGNOSIS — K219 Gastro-esophageal reflux disease without esophagitis: Secondary | ICD-10-CM | POA: Diagnosis not present

## 2021-12-26 DIAGNOSIS — Z9189 Other specified personal risk factors, not elsewhere classified: Secondary | ICD-10-CM | POA: Diagnosis not present

## 2021-12-26 DIAGNOSIS — Z79899 Other long term (current) drug therapy: Secondary | ICD-10-CM | POA: Diagnosis not present

## 2021-12-26 DIAGNOSIS — R262 Difficulty in walking, not elsewhere classified: Secondary | ICD-10-CM | POA: Diagnosis not present

## 2021-12-26 DIAGNOSIS — I10 Essential (primary) hypertension: Secondary | ICD-10-CM | POA: Diagnosis not present

## 2021-12-26 DIAGNOSIS — E119 Type 2 diabetes mellitus without complications: Secondary | ICD-10-CM | POA: Diagnosis not present

## 2021-12-26 DIAGNOSIS — M216X2 Other acquired deformities of left foot: Secondary | ICD-10-CM | POA: Diagnosis not present

## 2021-12-26 DIAGNOSIS — M24572 Contracture, left ankle: Secondary | ICD-10-CM | POA: Diagnosis not present

## 2021-12-26 DIAGNOSIS — G249 Dystonia, unspecified: Secondary | ICD-10-CM | POA: Diagnosis not present

## 2021-12-26 DIAGNOSIS — M19072 Primary osteoarthritis, left ankle and foot: Secondary | ICD-10-CM | POA: Diagnosis not present

## 2021-12-26 DIAGNOSIS — Z791 Long term (current) use of non-steroidal anti-inflammatories (NSAID): Secondary | ICD-10-CM | POA: Diagnosis not present

## 2021-12-26 DIAGNOSIS — M19172 Post-traumatic osteoarthritis, left ankle and foot: Secondary | ICD-10-CM | POA: Diagnosis not present

## 2021-12-27 DIAGNOSIS — Z7984 Long term (current) use of oral hypoglycemic drugs: Secondary | ICD-10-CM | POA: Diagnosis not present

## 2021-12-27 DIAGNOSIS — M216X2 Other acquired deformities of left foot: Secondary | ICD-10-CM | POA: Diagnosis not present

## 2021-12-27 DIAGNOSIS — Z9189 Other specified personal risk factors, not elsewhere classified: Secondary | ICD-10-CM | POA: Diagnosis not present

## 2021-12-27 DIAGNOSIS — M19072 Primary osteoarthritis, left ankle and foot: Secondary | ICD-10-CM | POA: Diagnosis not present

## 2021-12-27 DIAGNOSIS — Z79899 Other long term (current) drug therapy: Secondary | ICD-10-CM | POA: Diagnosis not present

## 2021-12-27 DIAGNOSIS — I10 Essential (primary) hypertension: Secondary | ICD-10-CM | POA: Diagnosis not present

## 2021-12-27 DIAGNOSIS — G249 Dystonia, unspecified: Secondary | ICD-10-CM | POA: Diagnosis not present

## 2021-12-27 DIAGNOSIS — Z791 Long term (current) use of non-steroidal anti-inflammatories (NSAID): Secondary | ICD-10-CM | POA: Diagnosis not present

## 2021-12-27 DIAGNOSIS — K219 Gastro-esophageal reflux disease without esophagitis: Secondary | ICD-10-CM | POA: Diagnosis not present

## 2021-12-27 DIAGNOSIS — M24572 Contracture, left ankle: Secondary | ICD-10-CM | POA: Diagnosis not present

## 2021-12-27 DIAGNOSIS — M19172 Post-traumatic osteoarthritis, left ankle and foot: Secondary | ICD-10-CM | POA: Diagnosis not present

## 2021-12-27 DIAGNOSIS — E039 Hypothyroidism, unspecified: Secondary | ICD-10-CM | POA: Diagnosis not present

## 2021-12-27 DIAGNOSIS — Z87891 Personal history of nicotine dependence: Secondary | ICD-10-CM | POA: Diagnosis not present

## 2021-12-27 DIAGNOSIS — E119 Type 2 diabetes mellitus without complications: Secondary | ICD-10-CM | POA: Diagnosis not present

## 2021-12-27 DIAGNOSIS — R262 Difficulty in walking, not elsewhere classified: Secondary | ICD-10-CM | POA: Diagnosis not present

## 2022-01-26 ENCOUNTER — Other Ambulatory Visit: Payer: Self-pay | Admitting: Family Medicine

## 2022-01-26 DIAGNOSIS — K219 Gastro-esophageal reflux disease without esophagitis: Secondary | ICD-10-CM

## 2022-01-28 NOTE — Telephone Encounter (Signed)
Requested medication (s) are due for refill today: -  Requested medication (s) are on the active medication list: historical med  Last refill:  11/05/21  Future visit scheduled: no  Notes to clinic:  historical provider   Requested Prescriptions  Pending Prescriptions Disp Refills   omeprazole (PRILOSEC) 40 MG capsule [Pharmacy Med Name: OMEPRAZOLE '40MG'$  CAPSULES] 90 capsule     Sig: TAKE 1 CAPSULE(40 MG) BY MOUTH DAILY BEFORE BREAKFAST     Gastroenterology: Proton Pump Inhibitors Passed - 01/26/2022 11:02 AM      Passed - Valid encounter within last 12 months    Recent Outpatient Visits           1 month ago Viral URI with cough   [facility] [place], Coralie Keens, NP   2 months ago LUQ pain   [facility] [place], Coralie Keens, NP   4 months ago Diarrhea, unspecified type   [place], PA-C   8 months ago Arthritis of left hip   [place], DO   9 months ago Annual physical exam   [place], Devonne Doughty, DO

## 2022-01-30 DIAGNOSIS — M19072 Primary osteoarthritis, left ankle and foot: Secondary | ICD-10-CM | POA: Diagnosis not present

## 2022-01-30 DIAGNOSIS — I739 Peripheral vascular disease, unspecified: Secondary | ICD-10-CM | POA: Diagnosis not present

## 2022-01-30 DIAGNOSIS — M24572 Contracture, left ankle: Secondary | ICD-10-CM | POA: Diagnosis not present

## 2022-01-30 DIAGNOSIS — M216X2 Other acquired deformities of left foot: Secondary | ICD-10-CM | POA: Diagnosis not present

## 2022-02-10 DIAGNOSIS — M19072 Primary osteoarthritis, left ankle and foot: Secondary | ICD-10-CM | POA: Diagnosis not present

## 2022-02-10 DIAGNOSIS — M216X2 Other acquired deformities of left foot: Secondary | ICD-10-CM | POA: Diagnosis not present

## 2022-02-10 DIAGNOSIS — Z96662 Presence of left artificial ankle joint: Secondary | ICD-10-CM | POA: Diagnosis not present

## 2022-02-10 DIAGNOSIS — M24572 Contracture, left ankle: Secondary | ICD-10-CM | POA: Diagnosis not present

## 2022-02-12 ENCOUNTER — Ambulatory Visit: Payer: Self-pay | Admitting: *Deleted

## 2022-02-12 NOTE — Telephone Encounter (Signed)
Message from [place] sent at 02/12/2022 11:52 AM EST  Summary: cough, runny nose, and headache   Pt has a cough, runny nose, and headache  Next appt is 12-15  Pt requesting a cb to discuss how to care for symptoms until next appt  Pt speaks and understand english but would benefit from an interpreter when call is returned          Call History   Type Contact Phone/Fax User  02/12/2022 11:51 AM EST Phone ([address] Ross Riley, Ross Riley (Self) [phone] Jerilynn Mages) Leroy Kennedy R   Reason for Disposition  Common cold with no complications    Pt had these same symptoms 2 months ago and was better after taking medication.   He has had surgery and is in bed.  Not able to get out.   Requesting to have the same medications prescribed that he had 2 months ago when he had these same symptoms.  Answer Assessment - Initial Assessment Questions 1. ONSET: "When did the nasal discharge start?"      Called him back using [employer] [phone] Cletus Gash.  I have a runny nose, cough and headache.   Started 3 days ago.  I had a surgery and can not leave the bed at the moment.   Has not tried anything OTC.   2. AMOUNT: "How much discharge is there?"       3. COUGH: "Do you have a cough?" If Yes, ask: "Describe the color of your sputum" (clear, white, yellow, green)     Yes    I have a dry cough.   I'm having the same symptoms I had 2 months ago when I was seen.   I got better after the medications.  4. RESPIRATORY DISTRESS: "Describe your breathing."      Having shortness of breath 5. FEVER: "Do you have a fever?" If Yes, ask: "What is your temperature, how was it measured, and when did it start?"     Chills not sure about fever or not. 6. SEVERITY: "Overall, how bad are you feeling right now?" (e.g., doesn't interfere with normal activities, staying home from school/work, staying in bed)      Can I have a refill of the same medications I was given 2 months ago when I was sick with  these same symptoms? 7. OTHER SYMPTOMS: "Do you have any other symptoms?" (e.g., sore throat, earache, wheezing, vomiting)     Runny nose, dry cough, headache 8. PREGNANCY: "Is there any chance you are pregnant?" "When was your last menstrual period?"     N/A  Protocols used: Common Cold-A-AH

## 2022-02-12 NOTE — Telephone Encounter (Signed)
  Chief Complaint: Headache, dry cough, chills, runny nose, shortness of breath (Had surgery and is in bed not able to get out.)    Seen on 12/10/2021 by Webb Silversmith, NP for URI with coughing, headache and URI.   Was prescribed Prednisone and Promethazine DM cough medicine.    Requesting to have these prescribed again since having the same symptoms. Symptoms: runny nose, dry cough, chills headache.  I returned his call using [employer] [phone].   Frequency: 3 days ago  Pertinent Negatives: Patient denies fever. Disposition: '[]'$ ED /'[]'$ Urgent Care (no appt availability in office) / '[]'$ Appointment(In office/virtual)/ '[]'$  [place] [facility]/ '[]'$ Home Care/ '[]'$ Refused Recommended Disposition /'[]'$ [place] [facility]/ '[x]'$  Follow-up with PCP Additional Notes: Message sent to [facility] with request for same medications he was prescribed back in Oct. For URI.   Post op and in bed.   Having same symptoms as before.

## 2022-02-13 ENCOUNTER — Ambulatory Visit: Payer: Self-pay | Admitting: *Deleted

## 2022-02-13 DIAGNOSIS — J069 Acute upper respiratory infection, unspecified: Secondary | ICD-10-CM

## 2022-02-13 MED ORDER — PREDNISONE 10 MG PO TABS: ORAL_TABLET | ORAL | 0 refills | Status: DC

## 2022-02-13 MED ORDER — PROMETHAZINE-DM 6.25-15 MG/5ML PO SYRP: 5.0000 mL | ORAL_SOLUTION | Freq: Three times a day (TID) | ORAL | 0 refills | Status: DC | PRN

## 2022-02-13 NOTE — Telephone Encounter (Signed)
Patient called back to see if PCP could prescribed any medications for sx as requested yesterday. Continues with productive cough clear sputum. Has tried warm liquids , hard candy cough drops with no relief. Reports he has surgery 3 weeks ago and is having difficulty moving around due to surgery. Appt scheduled for tomorrow. Patient requesting if any medication for cough can be given today. Please advise . Recommended UC if sx worsen .

## 2022-02-13 NOTE — Telephone Encounter (Signed)
Okay I have ordered cough meds to his pharmacy and he can keep apt 12/15  [name], [facility] 02/13/2022, 3:16 PM

## 2022-02-13 NOTE — Telephone Encounter (Signed)
  Chief Complaint: requesting medication for cough. See previous NT encounter from 02/12/22 Symptoms: same sx , cough is productive white sputum at this time. Constant coughing noted on call. Requesting if cough medication can be prescribed prior to appt tomorrow. Frequency: see NT encounter 02/12/22 Pertinent Negatives: Patient denies na  Disposition: '[]'$ ED /'[x]'$ Urgent Care (no appt availability in office) / '[]'$ Appointment(In office/virtual)/ '[]'$  [place] [facility]/ '[]'$ Home Care/ '[]'$ Refused Recommended Disposition /'[]'$ [facility]/ '[]'$  Follow-up with PCP Additional Notes:   Recommended UC if sx worsen. Patient reports he had surgery 3 weeks ago and has difficulty moving right now. Please advise. See NT encounter from 02/12/22   Reason for Disposition  [1] Caller requesting NON-URGENT health information AND [2] PCP's office is the best resource  Answer Assessment - Initial Assessment Questions 1. REASON FOR CALL or QUESTION: "What is your reason for calling today?" or "How can I best help you?" or "What question do you have that I can help answer?"     Requesting if medication for cough can be given today? See NT encounter from 02/13/22.  Protocols used: Information Only Call - No Triage-A-AH

## 2022-02-14 ENCOUNTER — Encounter: Payer: Self-pay | Admitting: Family Medicine

## 2022-02-14 ENCOUNTER — Telehealth (INDEPENDENT_AMBULATORY_CARE_PROVIDER_SITE_OTHER): Payer: Medicare Other | Admitting: Family Medicine

## 2022-02-14 VITALS — Ht 66.0 in | Wt 158.0 lb

## 2022-02-14 DIAGNOSIS — J069 Acute upper respiratory infection, unspecified: Secondary | ICD-10-CM | POA: Diagnosis not present

## 2022-02-14 NOTE — Progress Notes (Signed)
Virtual Visit via Telephone The purpose of this virtual visit is to provide medical care while limiting exposure to the novel coronavirus (COVID19) for both patient and office staff.  Consent was obtained for phone visit:  Yes.   Answered questions that patient had about telehealth interaction:  Yes.   I discussed the limitations, risks, security and privacy concerns of performing an evaluation and management service by telephone. I also discussed with the patient that there may be a patient responsible charge related to this service. The patient expressed understanding and agreed to proceed.  Patient Location: Home Provider Location: Ross Riley (Office)  Participants in virtual visit: - Patient: Ross Riley - CMA: Orinda Kenner, CMA - Provider: Dr Parks Ranger  ---------------------------------------------------------------------- Chief Complaint  Patient presents with   Cough   Headache    S: Reviewed CMA documentation. I have called patient and gathered additional HPI as follows:  Viral URI Bronchospasm Cough Reports that symptoms started within past few days with headache cough congestion dyspnea. He had similar URI 12/2021 treated with Prednisone and cough syrup and it resolved. He called yesterday 12/14 to request meds prior to apt today. He has started meds and feels better already, cough improved breathing is improved.  Denies any known or suspected exposure to person with or possibly with COVID19.  Denies any fevers, chills, sweats, body ache, sinus pain or pressure, headache, abdominal pain, diarrhea  Past Medical History:  Diagnosis Date   Arthritis    BPH (benign prostatic hyperplasia)    Chronic kidney disease    kidney stones   Dystonia    ED (erectile dysfunction)    GERD (gastroesophageal reflux disease)    Glaucoma (increased eye pressure)    Hematuria    Hemorrhoids    Hyperlipidemia    Hypertension    Thyroid disease    Social  History   Tobacco Use   Smoking status: Former    Packs/day: 1.00    Years: 32.00    Total pack years: 32.00    Types: Cigarettes    Quit date: 03/04/1991    Years since quitting: 30.9   Smokeless tobacco: Former  Scientific laboratory technician Use: Never used  Substance Use Topics   Alcohol use: Not Currently    Alcohol/week: 1.0 standard drink of alcohol    Types: 1 Shots of liquor per week   Drug use: No    Current Outpatient Medications:    ACCU-CHEK FASTCLIX LANCETS MISC, check blood sugar up to 1 time daily as directed (Patient not taking: Reported on 11/05/2021), Disp: 102 each, Rfl: 3   albuterol (VENTOLIN HFA) 108 (90 Base) MCG/ACT inhaler, Inhale 2 puffs into the lungs every 4 (four) hours as needed for shortness of breath (cough). (Patient not taking: Reported on 11/05/2021), Disp: 6.7 each, Rfl: 0   Alcohol Swabs (B-D SINGLE USE SWABS REGULAR) PADS, check blood sugar up to 1 time daily as directed (Patient not taking: Reported on 11/05/2021), Disp: 100 each, Rfl: 3   atorvastatin (LIPITOR) 40 MG tablet, Take 1 tablet (40 mg total) by mouth daily., Disp: 90 tablet, Rfl: 1   benazepril-hydrochlorthiazide (LOTENSIN HCT) 10-12.5 MG tablet, TAKE 1 TABLET BY MOUTH DAILY, Disp: 90 tablet, Rfl: 1   betamethasone valerate (VALISONE) 0.1 % cream, Apply topically 2 (two) times daily. For 2-4 weeks, apply to areas of scalp., Disp: 30 g, Rfl: 0   Blood Glucose Monitoring Suppl (ACCU-CHEK AVIVA PLUS) w/Device KIT, Use device to check blood  sugar up to 1 time daily as directed, Disp: 1 kit, Rfl: 0   Cholecalciferol (VITAMIN D3) 125 MCG (5000 UT) CAPS, Take by mouth., Disp: , Rfl:    dorzolamide-timolol (COSOPT) 22.3-6.8 MG/ML ophthalmic solution, dorzolamide 22.3 mg-timolol 6.8 mg/mL eye drops, Disp: , Rfl:    ezetimibe (ZETIA) 10 MG tablet, Take 1 tablet (10 mg total) by mouth daily., Disp: 30 tablet, Rfl: 3   glipiZIDE (GLUCOTROL XL) 5 MG 24 hr tablet, TAKE 1 TABLET(5 MG) BY MOUTH DAILY WITH BREAKFAST,  Disp: 90 tablet, Rfl: 1   glucose blood test strip, check blood sugar up to 1 time daily as directed, Disp: 100 each, Rfl: 3   latanoprost (XALATAN) 0.005 % ophthalmic solution, INT 1 GTT INTO OU QHS, Disp: , Rfl: 6   levothyroxine (SYNTHROID) 75 MCG tablet, Take 75 mcg by mouth., Disp: , Rfl:    meclizine (ANTIVERT) 25 MG tablet, TAKE 1 TABLET(25 MG) BY MOUTH THREE TIMES DAILY AS NEEDED FOR DIZZINESS (Patient not taking: Reported on 11/05/2021), Disp: 60 tablet, Rfl: 2   metoprolol tartrate (LOPRESSOR) 25 MG tablet, TAKE 1/2 TABLET(12.5 MG) BY MOUTH TWICE DAILY, Disp: 90 tablet, Rfl: 0   naproxen (NAPROSYN) 500 MG tablet, Take 1 tablet (500 mg total) by mouth 2 (two) times daily with a meal. For 2-4 weeks then as needed, Disp: 60 tablet, Rfl: 2   nortriptyline (PAMELOR) 10 MG capsule, , Disp: , Rfl:    omeprazole (PRILOSEC) 40 MG capsule, TAKE 1 CAPSULE(40 MG) BY MOUTH DAILY BEFORE BREAKFAST, Disp: 90 capsule, Rfl: 1   ondansetron (ZOFRAN ODT) 4 MG disintegrating tablet, Take 1 tablet (4 mg total) by mouth every 8 (eight) hours as needed for nausea or vomiting., Disp: 30 tablet, Rfl: 2   predniSONE (DELTASONE) 10 MG tablet, Take 6 tabs with breakfast Day 1, 5 tabs Day 2, 4 tabs Day 3, 3 tabs Day 4, 2 tabs Day 5, 1 tab Day 6., Disp: 21 tablet, Rfl: 0   promethazine-dextromethorphan (PROMETHAZINE-DM) 6.25-15 MG/5ML syrup, Take 5 mLs by mouth 3 (three) times daily as needed for cough., Disp: 118 mL, Rfl: 0   sildenafil (VIAGRA) 100 MG tablet, Take 1 tablet (100 mg total) by mouth daily as needed for erectile dysfunction. Take two hours prior to intercourse on an empty stomach (Patient not taking: Reported on 11/05/2021), Disp: 30 tablet, Rfl: 2   sucralfate (CARAFATE) 1 g tablet, Take 1 tablet (1 g total) by mouth 4 (four) times daily -  with meals and at bedtime for 5 days. (Patient not taking: Reported on 11/05/2021), Disp: 20 tablet, Rfl: 0     05/29/2021   10:24 AM 04/17/2021    2:18 PM 01/01/2021     1:38 PM  Depression screen PHQ 2/9  Decreased Interest 0 0 0  Down, Depressed, Hopeless 0 0 0  PHQ - 2 Score 0 0 0  Altered sleeping 0 0   Tired, decreased energy 0 0   Change in appetite 0 0   Feeling bad or failure about yourself  0 0   Trouble concentrating 0 0   Moving slowly or fidgety/restless 0 0   Suicidal thoughts  0   PHQ-9 Score 0 0   Difficult doing work/chores Not difficult at all Not difficult at all        05/29/2021   10:24 AM 04/17/2021    2:18 PM  GAD 7 : Generalized Anxiety Score  Nervous, Anxious, on Edge 0 0  Control/stop worrying 0 0  Worry too much - different things 0 0  Trouble relaxing 0 0  Restless 0 0  Easily annoyed or irritable 0 0  Afraid - awful might happen 0 0  Total GAD 7 Score 0 0  Anxiety Difficulty Not difficult at all Not difficult at all    -------------------------------------------------------------------------- O: No physical exam performed due to remote telephone encounter.  Lab results reviewed.  Recent Results (from the past 2160 hour(s))  SARS-CoV-2 RNA, Influenza A/B, and RSV RNA, Qualitative NAAT     Status: None   Collection Time: 12/10/21  2:34 PM  Result Value Ref Range   INFLUENZA A RNA NOT DETECTED NOT DETECTED   INFLUENZA B RNA NOT DETECTED NOT DETECTED   RSV RNA NOT DETECTED NOT DETECTED   SARS COV2 RNA NOT DETECTED NOT DETECTED    Comment: A Not Detected (negative) test result for this test  means that RNA from SARS-CoV-2, influenza A, influenza B and RSV was not present in the specimen above the limit of detection. However, it does not rule out the possibility of infection from SARS-CoV-2, influenza A, influenza B  and/or RSV. Laboratory test results should always be considered in the context of clinical observations and  epidemiological data in making a final diagnosis and patient management decisions. . Please review the "Fact Sheets" and FDA authorized labeling available for health care providers  and patients using the following websites: For HCP: GravelBags.it For Patients: PinkCheek.be . The test for detection of SARS-CoV-2 has been authorized by  the FDA under an Emergency Use Authorization (EUA) for use by authorized laboratories. . Methodology: Nucleic Acid Amplification Test including reverse transcription polymerase chain reaction (R T-PCR) and transcription mediated amplification (TMA). . Additional information about COVID-19 can be found at the [employer] website: www.[facility].com/Covid19. . For patients with a Detected or Inconclusive test result, please see CDC's COVID-19 Treatments and  Medications page located at  WeAreTheNavy.is treatments-for-severe-illness.html  for information on COVID-19 therapeutics. . For patients with a Not Detected test result, please see CDC's Vaccines for COVID-19 page located at PhoneStatistics.is for information on COVID-19 vaccines.     -------------------------------------------------------------------------- A&P:  Problem List Items Addressed This Visit   None Visit Diagnoses     Viral URI with cough    -  Primary      Consistent with viral URI post-viral cough symptoms for 1 week  Improved today  Plan: 1. Reassurance, likely self-limited 2. Rx Prednisone taper and Cough Syrup similar to before last URI 3. OTC cough cold medication 4. F/u if not improving   No orders of the defined types were placed in this encounter.   Follow-up: - Return in AS NEEDED  Patient verbalizes understanding with the above medical recommendations including the limitation of remote medical advice.  Specific follow-up and call-back criteria were given for patient to follow-up or seek medical care more urgently if needed.  - Time spent in direct consultation with patient on  phone: 6 minutes   [name], [facility] 02/14/2022, 11:07 AM

## 2022-02-14 NOTE — Patient Instructions (Addendum)
   Please schedule a Follow-up Appointment to: Return if symptoms worsen or fail to improve.  If you have any other questions or concerns, please feel free to call the office or send a message through MyChart. You may also schedule an earlier appointment if necessary.  Additionally, you may be receiving a survey about your experience at our office within a few days to 1 week by e-mail or mail. We value your feedback.  Domnic Vantol, DO [facility] 

## 2022-02-17 ENCOUNTER — Other Ambulatory Visit: Payer: Self-pay | Admitting: Family Medicine

## 2022-02-17 DIAGNOSIS — J069 Acute upper respiratory infection, unspecified: Secondary | ICD-10-CM

## 2022-02-18 NOTE — Telephone Encounter (Signed)
Requested medication (s) are due for refill today - provider review   Requested medication (s) are on the active medication list -yes  Future visit scheduled -no  Last refill: 02/13/22 110m  Notes to clinic: acute Rx- request for RF sent for provider review   Requested Prescriptions  Pending Prescriptions Disp Refills   promethazine-dextromethorphan (PROMETHAZINE-DM) 6.25-15 MG/5ML syrup [Pharmacy Med Name: PROMETHAZINE DM SYRUP] 118 mL 0    Sig: TAKE 5 ML BY MOUTH THREE TIMES DAILY AS NEEDED FOR COUGH     Ear, Nose, and Throat:  Antitussives/Expectorants Passed - 02/17/2022 12:07 PM      Passed - Valid encounter within last 12 months    Recent Outpatient Visits           4 days ago Viral URI with cough   [place]   2 months ago [facility]al URI with cough   [facility]   3 months ago LUQ pain   [facility] [place] [facility]   5 months ago Diarrhea, unspecified type   [place]-C   8 months ago Arthritis of left hip   [place]                 Requested Prescriptions  Pending Prescriptions Disp Refills   promethazine-dextromethorphan (PROMETHAZINE-DM) 6.25-15 MG/5ML syrup [Pharmacy Med Name: PROMETHAZINE DM SYRUP] 118 mL 0    Sig: TAKE 5 ML BY MOUTH THREE TIMES DAILY AS NEEDED FOR COUGH     Ear, Nose, and Throat:  Antitussives/Expectorants Passed - 02/17/2022 12:07 PM      Passed - Valid encounter within last 12 months    Recent Outpatient Visits           4 days ago Viral URI with cough   [place] DO   2 months ago Viral URI with cough   [facility]   3 months ago LUQ pain   [facility]   5 months ago Diarrhea, unspecified type   [place]   8 months ago Arthritis of left hip   [place]

## 2022-02-25 DIAGNOSIS — R262 Difficulty in walking, not elsewhere classified: Secondary | ICD-10-CM | POA: Diagnosis not present

## 2022-02-25 DIAGNOSIS — M25572 Pain in left ankle and joints of left foot: Secondary | ICD-10-CM | POA: Diagnosis not present

## 2022-02-25 DIAGNOSIS — Z4789 Encounter for other orthopedic aftercare: Secondary | ICD-10-CM | POA: Diagnosis not present

## 2022-02-27 DIAGNOSIS — R262 Difficulty in walking, not elsewhere classified: Secondary | ICD-10-CM | POA: Diagnosis not present

## 2022-02-27 DIAGNOSIS — M25572 Pain in left ankle and joints of left foot: Secondary | ICD-10-CM | POA: Diagnosis not present

## 2022-02-27 DIAGNOSIS — Z4789 Encounter for other orthopedic aftercare: Secondary | ICD-10-CM | POA: Diagnosis not present

## 2022-03-05 DIAGNOSIS — R262 Difficulty in walking, not elsewhere classified: Secondary | ICD-10-CM | POA: Diagnosis not present

## 2022-03-05 DIAGNOSIS — M25572 Pain in left ankle and joints of left foot: Secondary | ICD-10-CM | POA: Diagnosis not present

## 2022-03-05 DIAGNOSIS — Z4789 Encounter for other orthopedic aftercare: Secondary | ICD-10-CM | POA: Diagnosis not present

## 2022-03-07 DIAGNOSIS — Z4789 Encounter for other orthopedic aftercare: Secondary | ICD-10-CM | POA: Diagnosis not present

## 2022-03-07 DIAGNOSIS — M25572 Pain in left ankle and joints of left foot: Secondary | ICD-10-CM | POA: Diagnosis not present

## 2022-03-07 DIAGNOSIS — R262 Difficulty in walking, not elsewhere classified: Secondary | ICD-10-CM | POA: Diagnosis not present

## 2022-03-12 DIAGNOSIS — Z4789 Encounter for other orthopedic aftercare: Secondary | ICD-10-CM | POA: Diagnosis not present

## 2022-03-12 DIAGNOSIS — R262 Difficulty in walking, not elsewhere classified: Secondary | ICD-10-CM | POA: Diagnosis not present

## 2022-03-12 DIAGNOSIS — M25572 Pain in left ankle and joints of left foot: Secondary | ICD-10-CM | POA: Diagnosis not present

## 2022-03-14 ENCOUNTER — Other Ambulatory Visit: Payer: Self-pay | Admitting: Cardiovascular Disease

## 2022-03-14 DIAGNOSIS — Z4789 Encounter for other orthopedic aftercare: Secondary | ICD-10-CM | POA: Diagnosis not present

## 2022-03-14 DIAGNOSIS — M25572 Pain in left ankle and joints of left foot: Secondary | ICD-10-CM | POA: Diagnosis not present

## 2022-03-14 DIAGNOSIS — R262 Difficulty in walking, not elsewhere classified: Secondary | ICD-10-CM | POA: Diagnosis not present

## 2022-03-17 DIAGNOSIS — R262 Difficulty in walking, not elsewhere classified: Secondary | ICD-10-CM | POA: Diagnosis not present

## 2022-03-17 DIAGNOSIS — Z4789 Encounter for other orthopedic aftercare: Secondary | ICD-10-CM | POA: Diagnosis not present

## 2022-03-17 DIAGNOSIS — M25572 Pain in left ankle and joints of left foot: Secondary | ICD-10-CM | POA: Diagnosis not present

## 2022-03-19 DIAGNOSIS — M24572 Contracture, left ankle: Secondary | ICD-10-CM | POA: Diagnosis not present

## 2022-03-19 DIAGNOSIS — M19072 Primary osteoarthritis, left ankle and foot: Secondary | ICD-10-CM | POA: Diagnosis not present

## 2022-03-21 DIAGNOSIS — Z4789 Encounter for other orthopedic aftercare: Secondary | ICD-10-CM | POA: Diagnosis not present

## 2022-03-21 DIAGNOSIS — M25572 Pain in left ankle and joints of left foot: Secondary | ICD-10-CM | POA: Diagnosis not present

## 2022-03-21 DIAGNOSIS — R262 Difficulty in walking, not elsewhere classified: Secondary | ICD-10-CM | POA: Diagnosis not present

## 2022-03-24 DIAGNOSIS — Z4789 Encounter for other orthopedic aftercare: Secondary | ICD-10-CM | POA: Diagnosis not present

## 2022-03-24 DIAGNOSIS — M25572 Pain in left ankle and joints of left foot: Secondary | ICD-10-CM | POA: Diagnosis not present

## 2022-03-24 DIAGNOSIS — R262 Difficulty in walking, not elsewhere classified: Secondary | ICD-10-CM | POA: Diagnosis not present

## 2022-03-26 DIAGNOSIS — Z4789 Encounter for other orthopedic aftercare: Secondary | ICD-10-CM | POA: Diagnosis not present

## 2022-03-26 DIAGNOSIS — R262 Difficulty in walking, not elsewhere classified: Secondary | ICD-10-CM | POA: Diagnosis not present

## 2022-03-26 DIAGNOSIS — M25572 Pain in left ankle and joints of left foot: Secondary | ICD-10-CM | POA: Diagnosis not present

## 2022-03-31 DIAGNOSIS — Z4789 Encounter for other orthopedic aftercare: Secondary | ICD-10-CM | POA: Diagnosis not present

## 2022-03-31 DIAGNOSIS — M25572 Pain in left ankle and joints of left foot: Secondary | ICD-10-CM | POA: Diagnosis not present

## 2022-03-31 DIAGNOSIS — R262 Difficulty in walking, not elsewhere classified: Secondary | ICD-10-CM | POA: Diagnosis not present

## 2022-04-02 DIAGNOSIS — R262 Difficulty in walking, not elsewhere classified: Secondary | ICD-10-CM | POA: Diagnosis not present

## 2022-04-02 DIAGNOSIS — I152 Hypertension secondary to endocrine disorders: Secondary | ICD-10-CM | POA: Diagnosis not present

## 2022-04-02 DIAGNOSIS — Z4789 Encounter for other orthopedic aftercare: Secondary | ICD-10-CM | POA: Diagnosis not present

## 2022-04-02 DIAGNOSIS — E1169 Type 2 diabetes mellitus with other specified complication: Secondary | ICD-10-CM | POA: Diagnosis not present

## 2022-04-02 DIAGNOSIS — E039 Hypothyroidism, unspecified: Secondary | ICD-10-CM | POA: Diagnosis not present

## 2022-04-02 DIAGNOSIS — M25572 Pain in left ankle and joints of left foot: Secondary | ICD-10-CM | POA: Diagnosis not present

## 2022-04-02 DIAGNOSIS — E559 Vitamin D deficiency, unspecified: Secondary | ICD-10-CM | POA: Diagnosis not present

## 2022-04-02 DIAGNOSIS — E1159 Type 2 diabetes mellitus with other circulatory complications: Secondary | ICD-10-CM | POA: Diagnosis not present

## 2022-04-02 DIAGNOSIS — E785 Hyperlipidemia, unspecified: Secondary | ICD-10-CM | POA: Diagnosis not present

## 2022-04-02 LAB — MICROALBUMIN, URINE: Microalb, Ur: 34

## 2022-04-02 LAB — HEMOGLOBIN A1C: Hemoglobin A1C: 7.1

## 2022-04-02 LAB — MICROALBUMIN / CREATININE URINE RATIO: Microalb Creat Ratio: 29.8

## 2022-04-08 ENCOUNTER — Telehealth: Payer: Self-pay

## 2022-04-08 NOTE — Telephone Encounter (Signed)
Copied from [place] [phone]. Topic: General - Other >> Apr 08, 2022  2:06 PM Everette C wrote: Reason for CRM: The patient's daughter has called to request a prescription for orthopedic shoes for the patient   The patient would also like for the prescription to be printed so they can take it to the supplier of their choice  Please contact further when possible

## 2022-04-08 NOTE — Telephone Encounter (Signed)
I will need more information before I can write this prescription.  What exactly do they mean by "Orthopedic" Shoes?  Is this Diabetic Shoes or something else?  I can write "Orthopedic Shoes" on rx pad but I am not sure if that is covered.  [name], [facility] 04/08/2022, 2:37 PM

## 2022-04-08 NOTE — Telephone Encounter (Signed)
I have sent a MyChart message to his daughter asking them to make an appt so we can get more information so you can write an accurate prescription so insurance will approve the request.

## 2022-04-09 DIAGNOSIS — H401132 Primary open-angle glaucoma, bilateral, moderate stage: Secondary | ICD-10-CM | POA: Diagnosis not present

## 2022-04-09 DIAGNOSIS — R262 Difficulty in walking, not elsewhere classified: Secondary | ICD-10-CM | POA: Diagnosis not present

## 2022-04-09 DIAGNOSIS — M25572 Pain in left ankle and joints of left foot: Secondary | ICD-10-CM | POA: Diagnosis not present

## 2022-04-09 DIAGNOSIS — Z4789 Encounter for other orthopedic aftercare: Secondary | ICD-10-CM | POA: Diagnosis not present

## 2022-04-09 LAB — HM DIABETES EYE EXAM

## 2022-04-14 DIAGNOSIS — R262 Difficulty in walking, not elsewhere classified: Secondary | ICD-10-CM | POA: Diagnosis not present

## 2022-04-14 DIAGNOSIS — M25572 Pain in left ankle and joints of left foot: Secondary | ICD-10-CM | POA: Diagnosis not present

## 2022-04-14 DIAGNOSIS — Z4789 Encounter for other orthopedic aftercare: Secondary | ICD-10-CM | POA: Diagnosis not present

## 2022-04-16 DIAGNOSIS — Z4789 Encounter for other orthopedic aftercare: Secondary | ICD-10-CM | POA: Diagnosis not present

## 2022-04-16 DIAGNOSIS — R262 Difficulty in walking, not elsewhere classified: Secondary | ICD-10-CM | POA: Diagnosis not present

## 2022-04-16 DIAGNOSIS — M25572 Pain in left ankle and joints of left foot: Secondary | ICD-10-CM | POA: Diagnosis not present

## 2022-04-17 ENCOUNTER — Other Ambulatory Visit: Payer: Self-pay | Admitting: Family Medicine

## 2022-04-17 DIAGNOSIS — I10 Essential (primary) hypertension: Secondary | ICD-10-CM

## 2022-04-17 NOTE — Telephone Encounter (Signed)
Requested Prescriptions  Pending Prescriptions Disp Refills   benazepril-hydrochlorthiazide (LOTENSIN HCT) 10-12.5 MG tablet [Pharmacy Med Name: BENAZEPRIL/HCTZ 10/12.5MG TABLETS] 90 tablet 0    Sig: TAKE 1 TABLET BY MOUTH DAILY     Cardiovascular:  ACEI + Diuretic Combos Passed - 04/17/2022  5:03 PM      Passed - Na in normal range and within 180 days    Sodium  Date Value Ref Range Status  11/07/2021 138 135 - 146 mmol/L Final  06/19/2015 142 134 - 144 mmol/L Final  12/07/2012 139 136 - 145 mmol/L Final         Passed - K in normal range and within 180 days    Potassium  Date Value Ref Range Status  11/07/2021 3.7 3.5 - 5.3 mmol/L Final  12/07/2012 3.3 (L) 3.5 - 5.1 mmol/L Final         Passed - Cr in normal range and within 180 days    Creat  Date Value Ref Range Status  11/07/2021 0.99 0.70 - 1.28 mg/dL Final   Creatinine, POC  Date Value Ref Range Status  06/18/2015 0 mg/dL Final         Passed - eGFR is 30 or above and within 180 days    GFR, Est African American  Date Value Ref Range Status  11/09/2019 83 > OR = 60 mL/min/1.20m Final   GFR, Est Non African American  Date Value Ref Range Status  11/09/2019 72 > OR = 60 mL/min/1.712mFinal   GFR, Estimated  Date Value Ref Range Status  11/05/2021 >60 >60 mL/min Final    Comment:    (NOTE) Calculated using the CKD-EPI Creatinine Equation (2021)    eGFR  Date Value Ref Range Status  11/07/2021 82 > OR = 60 mL/min/1.7360minal         Passed - Patient is not pregnant      Passed - Last BP in normal range    BP Readings from Last 1 Encounters:  12/10/21 136/86         Passed - Valid encounter within last 6 months    Recent Outpatient Visits           2 months ago Viral URI with cough   [place]   4 months ago [facility]al URI with cough   [facility]   5 months ago LUQ pain   [facility]   7 months ago Diarrhea, unspecified type   [place] [place] Arthritis of left [place]   [place]       Future Appointments             In 1 week [facility]

## 2022-04-21 DIAGNOSIS — M24572 Contracture, left ankle: Secondary | ICD-10-CM | POA: Diagnosis not present

## 2022-04-21 DIAGNOSIS — M19072 Primary osteoarthritis, left ankle and foot: Secondary | ICD-10-CM | POA: Diagnosis not present

## 2022-04-21 DIAGNOSIS — M216X2 Other acquired deformities of left foot: Secondary | ICD-10-CM | POA: Diagnosis not present

## 2022-04-21 DIAGNOSIS — Z96662 Presence of left artificial ankle joint: Secondary | ICD-10-CM | POA: Diagnosis not present

## 2022-04-23 ENCOUNTER — Other Ambulatory Visit: Payer: Self-pay | Admitting: Family Medicine

## 2022-04-23 DIAGNOSIS — I10 Essential (primary) hypertension: Secondary | ICD-10-CM

## 2022-04-23 MED ORDER — METOPROLOL TARTRATE 25 MG PO TABS: ORAL_TABLET | ORAL | 0 refills | Status: DC

## 2022-04-23 NOTE — Telephone Encounter (Signed)
Medication Refill - Medication: metoprolol tartrate (LOPRESSOR) 25 MG tablet   Has the patient contacted their pharmacy? Yes.   (Agent: If no, request that the patient contact the pharmacy for the refill. If patient does not wish to contact the pharmacy document the reason why and proceed with request.) (Agent: If yes, when and what did the pharmacy advise?)call pcp   Preferred Pharmacy (with phone number or street name): [facility] [place], [place]  Has the patient been seen for an appointment in the last year OR does the patient have an upcoming appointment? Yes.    Agent: Please be advised that RX refills may take up to 3 business days. We ask that you follow-up with your pharmacy.

## 2022-04-23 NOTE — Telephone Encounter (Signed)
Requested Prescriptions  Pending Prescriptions Disp Refills   metoprolol tartrate (LOPRESSOR) 25 MG tablet 90 tablet 0    Sig: TAKE 1/2 TABLET(12.5 MG) BY MOUTH TWICE DAILY     Cardiovascular:  Beta Blockers Passed - 04/23/2022 12:29 PM      Passed - Last BP in normal range    BP Readings from Last 1 Encounters:  12/10/21 136/86         Passed - Last Heart Rate in normal range    Pulse Readings from Last 1 Encounters:  12/10/21 60         Passed - Valid encounter within last 6 months    Recent Outpatient Visits           2 months ago Viral URI with cough   [place], DO   4 months ago Viral URI with cough   [facility] [place], Coralie Keens, NP   5 months ago LUQ pain   [facility] [place], Coralie Keens, NP   7 months ago Diarrhea, unspecified type   [place], [place]   10 months ago Arthritis of left hip   [place], DO       Future Appointments             In 2 days [place], Devonne Doughty, DO [facility], [facility]

## 2022-04-24 DIAGNOSIS — R262 Difficulty in walking, not elsewhere classified: Secondary | ICD-10-CM | POA: Diagnosis not present

## 2022-04-24 DIAGNOSIS — M25572 Pain in left ankle and joints of left foot: Secondary | ICD-10-CM | POA: Diagnosis not present

## 2022-04-24 DIAGNOSIS — Z4789 Encounter for other orthopedic aftercare: Secondary | ICD-10-CM | POA: Diagnosis not present

## 2022-04-25 ENCOUNTER — Encounter: Payer: Self-pay | Admitting: Family Medicine

## 2022-04-25 ENCOUNTER — Ambulatory Visit (INDEPENDENT_AMBULATORY_CARE_PROVIDER_SITE_OTHER): Payer: 59 | Admitting: Family Medicine

## 2022-04-25 VITALS — BP 128/72 | HR 54 | Ht 66.0 in | Wt 157.2 lb

## 2022-04-25 DIAGNOSIS — E1136 Type 2 diabetes mellitus with diabetic cataract: Secondary | ICD-10-CM | POA: Diagnosis not present

## 2022-04-25 DIAGNOSIS — M159 Polyosteoarthritis, unspecified: Secondary | ICD-10-CM | POA: Diagnosis not present

## 2022-04-25 DIAGNOSIS — G248 Other dystonia: Secondary | ICD-10-CM | POA: Diagnosis not present

## 2022-04-25 DIAGNOSIS — Z23 Encounter for immunization: Secondary | ICD-10-CM | POA: Diagnosis not present

## 2022-04-25 DIAGNOSIS — I10 Essential (primary) hypertension: Secondary | ICD-10-CM | POA: Diagnosis not present

## 2022-04-25 MED ORDER — GABAPENTIN 300 MG PO CAPS: 300.0000 mg | ORAL_CAPSULE | Freq: Two times a day (BID) | ORAL | 3 refills | Status: DC

## 2022-04-25 MED ORDER — METOPROLOL TARTRATE 25 MG PO TABS: ORAL_TABLET | ORAL | 3 refills | Status: DC

## 2022-04-25 NOTE — Patient Instructions (Addendum)
Thank you for coming to the office today.  Last A1c 7.1 in January. It was controlled.  I have re ordered the Gabapentin '300mg'$ , for 2 pills per day, one in morning and one in evening. For 90 day with refills.  I have re ordered the Metoprolol, take half tablet twice a day. Refilled 90 day with refills.  We will send an order for Diabetic Shoes to [facility].  They should contact you. If you do not hear back in 2 weeks, you may call them to check status.  Flu shot today.   Please schedule a Follow-up Appointment to: Return in about 6 months (around 10/24/2022) for 6 month follow-up DM updates.  If you have any other questions or concerns, please feel free to call the office or send a message through [place]. You may also schedule an earlier appointment if necessary.  Additionally, you may be receiving a survey about your experience at our office within a few days to 1 week by e-mail or mail. We value your feedback.  [name], DO [place]

## 2022-04-25 NOTE — Progress Notes (Unsigned)
Subjective:    Patient ID: Ross Riley, male    DOB: Oct 24, 1950, 72 y.o.   MRN: [id number]  Ross Riley is a 72 y.o. male presenting on 04/25/2022 for orthopedic shoes and Arthritis   HPI  Diabetes, Type 2 Followed by [facility] A1c 7.1 (04/02/22) Urine test 04/02/22,  114.1 cr and alb 34, and ratio 29.8 normal Meds: Glipizide XL '5mg'$  daily Reports good compliance. 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    Health Maintenance: Flu shot today     04/25/2022   10:24 AM 05/29/2021   10:24 AM 04/17/2021    2:18 PM  Depression screen PHQ 2/9  Decreased Interest 1 0 0  Down, Depressed, Hopeless 1 0 0  PHQ - 2 Score 2 0 0  Altered sleeping 0 0 0  Tired, decreased energy 1 0 0  Change in appetite 0 0 0  Feeling bad or failure about yourself  0 0 0  Trouble concentrating 0 0 0  Moving slowly or fidgety/restless 0 0 0  Suicidal thoughts 0  0  PHQ-9 Score 3 0 0  Difficult doing work/chores Not difficult at all Not difficult at all Not difficult at all    Social History   Tobacco Use   Smoking status: Former    Packs/day: 1.00    Years: 32.00    Total pack years: 32.00    Types: Cigarettes    Quit date: 03/04/1991    Years since quitting: 31.1   Smokeless tobacco: Former  Scientific laboratory technician Use: Never used  Substance Use Topics   Alcohol use: Not Currently    Alcohol/week: 1.0 standard drink of alcohol    Types: 1 Shots of liquor per week   Drug use: No    Review of Systems Per HPI unless specifically indicated above     Objective:    BP 128/72   Pulse (!) 54   Ht '5\' 6"'$  (1.676 m)   Wt 157 lb 3.2 oz (71.3 kg)   SpO2 99%   BMI 25.37 kg/m   Wt Readings from Last 3 Encounters:  04/25/22 157 lb 3.2 oz (71.3  kg)  02/14/22 158 lb (71.7 kg)  12/10/21 158 lb (71.7 kg)    Physical Exam Vitals and nursing note reviewed.  Constitutional:      General: He is not in acute distress.    Appearance: He is well-developed. He is not diaphoretic.     Comments: Well-appearing, comfortable, cooperative  HENT:     Head: Normocephalic and atraumatic.  Eyes:     General:        Right eye: No discharge.        Left eye: No discharge.     Conjunctiva/sclera: Conjunctivae normal.     Pupils: Pupils are equal, round, and reactive to light.  Neck:     Thyroid: No thyromegaly.  Cardiovascular:     Rate and Rhythm: Normal rate and regular rhythm.     Pulses: Normal pulses.     Heart sounds: Normal heart sounds. No murmur heard. Pulmonary:     Effort: Pulmonary effort is normal. No respiratory distress.     Breath sounds: Normal breath sounds. No wheezing or rales.  Abdominal:  General: Bowel sounds are normal. There is no distension.     Palpations: Abdomen is soft. There is no mass.     Tenderness: There is no abdominal tenderness.  Musculoskeletal:        General: No tenderness. Normal range of motion.     Cervical back: Normal range of motion and neck supple.     Comments: Upper / Lower Extremities: - Normal muscle tone, strength bilateral upper extremities 5/5, lower extremities 5/5  Lymphadenopathy:     Cervical: No cervical adenopathy.  Skin:    General: Skin is warm and dry.     Findings: No erythema or rash.  Neurological:     Mental Status: He is alert and oriented to person, place, and time.     Comments: Distal sensation intact to light touch all extremities  Psychiatric:        Mood and Affect: Mood normal.        Behavior: Behavior normal.        Thought Content: Thought content normal.     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 No ulceration. He has callus formation heels and forefoot. Intact sensation to monofilament.      Results for orders placed or performed in visit on 04/25/22  Microalbumin, urine  Result Value Ref Range   Microalb, Ur 34   Microalbumin / creatinine urine ratio  Result Value Ref Range   Microalb Creat Ratio 29.8   Hemoglobin A1c  Result Value Ref Range   Hemoglobin A1C 7.1       Assessment & Plan:   Problem List Items Addressed This Visit     Essential hypertension   Relevant Medications   metoprolol tartrate (LOPRESSOR) 25 MG tablet   Focal dystonia    Stable, chronic Left foot deformity - chronic problem. It is significantly affecting his gait and limiting his ambulation which appears to have affected degenerative wear on his Left knee, now s/p TKR L knee - Followed by Neurology / Manson Passey - he has diabetic shoes and orthotic inserts / AFO S/p L Ankle surgery  Will order new DM shoes  Chronic L foot deformity and dystonia Callus formation on heels. No ulcer  Completed DM Foot Exam today in office, 04/25/22. See exam note.  Plan - Proceed with ordering Diabetic Shoes, completed rx pad, will fax back to [facility] - Patient would benefit from Diabetic Shoes due to foot deformity and callus formation diabetes control is improving on current regimen, and I am continuing to monitor and manage diabetes.      Osteoarthritis of multiple joints   Relevant Medications   gabapentin (NEURONTIN) 300 MG capsule   Type 2 diabetes mellitus with diabetic cataract, without long-term current use of insulin ([place]) - Primary    Followed by [facility] Controlled A1c 7.1 No known hypoglycemia. Complications - DM cataracts removed, other including hyperlipidemia,  GERD, hypothyroidism - increases risk of future cardiovascular complications   Plan:  1. Continue current therapy - Glipizide XL '5mg'$  daily - caution hypoglycemia 2. Encourage improved lifestyle - low carb, low sugar diet, reduce portion size, continue improving regular exercise 3. Check  CBG , bring log to next visit for review 4. Continue ASA, Statin       Other Visit Diagnoses     Needs flu shot       Relevant Orders   Flu Vaccine QUAD High Dose(Fluad) (Completed)       Last A1c 7.1 in January. It was controlled.  I have re ordered the Gabapentin '300mg'$ , for 2 pills per day, one in morning and one in evening. For 90 day with refills.  I have re ordered the Metoprolol, take half tablet twice a day. Refilled 90 day with refills.  We will send an order for Diabetic Shoes to [facility].  They should contact you. If you do not hear back in 2 weeks, you may call them to check status.  Flu shot today.   Meds ordered this encounter  Medications   gabapentin (NEURONTIN) 300 MG capsule    Sig: Take 1 capsule (300 mg total) by mouth 2 (two) times daily.    Dispense:  180 capsule    Refill:  3   metoprolol tartrate (LOPRESSOR) 25 MG tablet    Sig: TAKE 1/2 TABLET(12.5 MG) BY MOUTH TWICE DAILY    Dispense:  90 tablet    Refill:  3    Add extra refills for future.      Follow up plan: Return in about 6 months (around 10/24/2022) for 6 month follow-up DM updates.   [name], DO [facility] 04/25/2022, 10:33 AM

## 2022-04-26 ENCOUNTER — Encounter: Payer: Self-pay | Admitting: Family Medicine

## 2022-04-26 NOTE — Addendum Note (Signed)
Addended by: Olin Hauser on: 04/26/2022 02:19 AM   Modules accepted: Level of Service

## 2022-04-26 NOTE — Assessment & Plan Note (Signed)
Stable, chronic Left foot deformity - chronic problem. It is significantly affecting his gait and limiting his ambulation which appears to have affected degenerative wear on his Left knee, now s/p TKR L knee - Followed by Neurology / Manson Passey - he has diabetic shoes and orthotic inserts / AFO S/p L Ankle surgery  Will order new DM shoes  Chronic L foot deformity and dystonia Callus formation on heels. No ulcer  Completed DM Foot Exam today in office, 04/25/22. See exam note.  Plan - Proceed with ordering Diabetic Shoes, completed rx pad, will fax back to [facility] - Patient would benefit from Diabetic Shoes due to foot deformity and callus formation diabetes control is improving on current regimen, and I am continuing to monitor and manage diabetes.

## 2022-04-26 NOTE — Assessment & Plan Note (Signed)
Followed by [facility] Controlled A1c 7.1 No known hypoglycemia. Complications - DM cataracts removed, other including hyperlipidemia, GERD, hypothyroidism - increases risk of future cardiovascular complications   Plan:  1. Continue current therapy - Glipizide XL '5mg'$  daily - caution hypoglycemia 2. Encourage improved lifestyle - low carb, low sugar diet, reduce portion size, continue improving regular exercise 3. Check CBG , bring log to next visit for review 4. Continue ASA, Statin

## 2022-04-29 DIAGNOSIS — M25572 Pain in left ankle and joints of left foot: Secondary | ICD-10-CM | POA: Diagnosis not present

## 2022-04-29 DIAGNOSIS — R262 Difficulty in walking, not elsewhere classified: Secondary | ICD-10-CM | POA: Diagnosis not present

## 2022-04-29 DIAGNOSIS — Z4789 Encounter for other orthopedic aftercare: Secondary | ICD-10-CM | POA: Diagnosis not present

## 2022-05-01 DIAGNOSIS — Z4789 Encounter for other orthopedic aftercare: Secondary | ICD-10-CM | POA: Diagnosis not present

## 2022-05-01 DIAGNOSIS — R262 Difficulty in walking, not elsewhere classified: Secondary | ICD-10-CM | POA: Diagnosis not present

## 2022-05-01 DIAGNOSIS — M25572 Pain in left ankle and joints of left foot: Secondary | ICD-10-CM | POA: Diagnosis not present

## 2022-05-07 DIAGNOSIS — Z4789 Encounter for other orthopedic aftercare: Secondary | ICD-10-CM | POA: Diagnosis not present

## 2022-05-07 DIAGNOSIS — R262 Difficulty in walking, not elsewhere classified: Secondary | ICD-10-CM | POA: Diagnosis not present

## 2022-05-07 DIAGNOSIS — M25572 Pain in left ankle and joints of left foot: Secondary | ICD-10-CM | POA: Diagnosis not present

## 2022-05-09 DIAGNOSIS — M25572 Pain in left ankle and joints of left foot: Secondary | ICD-10-CM | POA: Diagnosis not present

## 2022-05-09 DIAGNOSIS — R262 Difficulty in walking, not elsewhere classified: Secondary | ICD-10-CM | POA: Diagnosis not present

## 2022-05-09 DIAGNOSIS — Z4789 Encounter for other orthopedic aftercare: Secondary | ICD-10-CM | POA: Diagnosis not present

## 2022-05-12 ENCOUNTER — Ambulatory Visit: Payer: Self-pay

## 2022-05-12 DIAGNOSIS — Z4789 Encounter for other orthopedic aftercare: Secondary | ICD-10-CM | POA: Diagnosis not present

## 2022-05-12 DIAGNOSIS — R262 Difficulty in walking, not elsewhere classified: Secondary | ICD-10-CM | POA: Diagnosis not present

## 2022-05-12 DIAGNOSIS — M25572 Pain in left ankle and joints of left foot: Secondary | ICD-10-CM | POA: Diagnosis not present

## 2022-05-12 NOTE — Telephone Encounter (Signed)
     Chief Complaint: Daughter states pt.'s Atorvastatin needs to be changed back to 20 mg. Please advise when this is done. Symptoms: n/a Frequency: n/a Pertinent Negatives: Patient denies  Disposition: [] ED /[] Urgent Care (no appt availability in office) / [] Appointment(In office/virtual)/ []  [place] [facility]/ [] Home Care/ [] Refused Recommended Disposition /[] [place] [facility]/ [x]  Follow-up with PCP Additional Notes: Please advise daughter.  Answer Assessment - Initial Assessment Questions 1. NAME of MEDICINE: "What medicine(s) are you calling about?"     Atorvastatin - needs to change back to 20 mg now that surgery is completed  2. QUESTION: "What is your question?" (e.g., double dose of medicine, side effect)     N/a 3. PRESCRIBER: "Who prescribed the medicine?" Reason: if prescribed by specialist, call should be referred to that group.      4. SYMPTOMS: "Do you have any symptoms?" If Yes, ask: "What symptoms are you having?"  "How bad are the symptoms (e.g., mild, moderate, severe)     No 5. PREGNANCY:  "Is there any chance that you are pregnant?" "When was your last menstrual period?"     N/a  Protocols used: Medication Question Call-A-AH

## 2022-05-12 NOTE — Telephone Encounter (Signed)
Patient's daughter called, left VM to return the call to the office to discuss medication with a nurse.  N[place], MD  [place], [place] [address]  [phone]   Summary: atorvastatin (LIPITOR) 40 MG tablet request for decrease in 20 mg?   The daughter called in stating the patient is requesting a decrease in dosage of medication for atorvastatin (LIPITOR) 40 MG tablet. The daughter states his primary provider had him on 20 mg but his specialist changed it to 40 mg for the surgery. The specialist has told the patient to call his primary to get it switched back to 20 mg. He uses  [place] [id number], [place] [address] Phone: [phone] Fax: [phone]   Please assist patient further

## 2022-05-13 ENCOUNTER — Other Ambulatory Visit: Payer: Self-pay | Admitting: Cardiovascular Disease

## 2022-05-13 DIAGNOSIS — E1169 Type 2 diabetes mellitus with other specified complication: Secondary | ICD-10-CM

## 2022-05-14 ENCOUNTER — Other Ambulatory Visit: Payer: Self-pay | Admitting: Family Medicine

## 2022-05-14 DIAGNOSIS — E1169 Type 2 diabetes mellitus with other specified complication: Secondary | ICD-10-CM

## 2022-05-14 MED ORDER — ATORVASTATIN CALCIUM 20 MG PO TABS: 20.0000 mg | ORAL_TABLET | Freq: Every day | ORAL | 3 refills | Status: DC

## 2022-05-14 NOTE — Telephone Encounter (Signed)
Please notify them.  Done. New order signed Atorvastatin '20mg'$  instead of '40mg'$ .  [name], [facility] 05/14/2022, 5:26 PM

## 2022-05-15 NOTE — Telephone Encounter (Signed)
I sent a secure MyChart message to the patient letting him know that Dr. Raliegh Ip changed it and sent it to [facility] in [place].

## 2022-05-16 ENCOUNTER — Encounter: Payer: Self-pay | Admitting: Family Medicine

## 2022-05-16 NOTE — Progress Notes (Signed)
Completed DM Shoe order form from [facility]  To be faxed on Monday 3/18  [name], [facility] 05/16/2022, 6:39 PM

## 2022-05-19 DIAGNOSIS — Z4789 Encounter for other orthopedic aftercare: Secondary | ICD-10-CM | POA: Diagnosis not present

## 2022-05-19 DIAGNOSIS — R262 Difficulty in walking, not elsewhere classified: Secondary | ICD-10-CM | POA: Diagnosis not present

## 2022-05-19 DIAGNOSIS — M25572 Pain in left ankle and joints of left foot: Secondary | ICD-10-CM | POA: Diagnosis not present

## 2022-05-21 DIAGNOSIS — M25572 Pain in left ankle and joints of left foot: Secondary | ICD-10-CM | POA: Diagnosis not present

## 2022-05-21 DIAGNOSIS — R262 Difficulty in walking, not elsewhere classified: Secondary | ICD-10-CM | POA: Diagnosis not present

## 2022-05-21 DIAGNOSIS — Z4789 Encounter for other orthopedic aftercare: Secondary | ICD-10-CM | POA: Diagnosis not present

## 2022-05-26 DIAGNOSIS — Z4789 Encounter for other orthopedic aftercare: Secondary | ICD-10-CM | POA: Diagnosis not present

## 2022-05-26 DIAGNOSIS — M25572 Pain in left ankle and joints of left foot: Secondary | ICD-10-CM | POA: Diagnosis not present

## 2022-05-26 DIAGNOSIS — R262 Difficulty in walking, not elsewhere classified: Secondary | ICD-10-CM | POA: Diagnosis not present

## 2022-05-27 DIAGNOSIS — E1136 Type 2 diabetes mellitus with diabetic cataract: Secondary | ICD-10-CM | POA: Diagnosis not present

## 2022-05-28 DIAGNOSIS — M25572 Pain in left ankle and joints of left foot: Secondary | ICD-10-CM | POA: Diagnosis not present

## 2022-05-28 DIAGNOSIS — R262 Difficulty in walking, not elsewhere classified: Secondary | ICD-10-CM | POA: Diagnosis not present

## 2022-05-28 DIAGNOSIS — Z4789 Encounter for other orthopedic aftercare: Secondary | ICD-10-CM | POA: Diagnosis not present

## 2022-06-02 DIAGNOSIS — M25572 Pain in left ankle and joints of left foot: Secondary | ICD-10-CM | POA: Diagnosis not present

## 2022-06-02 DIAGNOSIS — Z4789 Encounter for other orthopedic aftercare: Secondary | ICD-10-CM | POA: Diagnosis not present

## 2022-06-02 DIAGNOSIS — R262 Difficulty in walking, not elsewhere classified: Secondary | ICD-10-CM | POA: Diagnosis not present

## 2022-06-04 DIAGNOSIS — M25572 Pain in left ankle and joints of left foot: Secondary | ICD-10-CM | POA: Diagnosis not present

## 2022-06-04 DIAGNOSIS — Z4789 Encounter for other orthopedic aftercare: Secondary | ICD-10-CM | POA: Diagnosis not present

## 2022-06-04 DIAGNOSIS — R262 Difficulty in walking, not elsewhere classified: Secondary | ICD-10-CM | POA: Diagnosis not present

## 2022-06-09 DIAGNOSIS — R262 Difficulty in walking, not elsewhere classified: Secondary | ICD-10-CM | POA: Diagnosis not present

## 2022-06-09 DIAGNOSIS — M25572 Pain in left ankle and joints of left foot: Secondary | ICD-10-CM | POA: Diagnosis not present

## 2022-06-09 DIAGNOSIS — Z4789 Encounter for other orthopedic aftercare: Secondary | ICD-10-CM | POA: Diagnosis not present

## 2022-06-11 DIAGNOSIS — M25572 Pain in left ankle and joints of left foot: Secondary | ICD-10-CM | POA: Diagnosis not present

## 2022-06-11 DIAGNOSIS — Z4789 Encounter for other orthopedic aftercare: Secondary | ICD-10-CM | POA: Diagnosis not present

## 2022-06-11 DIAGNOSIS — R262 Difficulty in walking, not elsewhere classified: Secondary | ICD-10-CM | POA: Diagnosis not present

## 2022-06-15 ENCOUNTER — Other Ambulatory Visit: Payer: Self-pay

## 2022-06-15 ENCOUNTER — Inpatient Hospital Stay
Admission: EM | Admit: 2022-06-15 | Discharge: 2022-06-18 | DRG: 392 | Disposition: A | Discharge status: Home / Self Care | Payer: 59 | Attending: Internal Medicine | Admitting: Internal Medicine

## 2022-06-15 ENCOUNTER — Emergency Department: Payer: 59

## 2022-06-15 DIAGNOSIS — N529 Male erectile dysfunction, unspecified: Secondary | ICD-10-CM | POA: Diagnosis present

## 2022-06-15 DIAGNOSIS — E1136 Type 2 diabetes mellitus with diabetic cataract: Secondary | ICD-10-CM | POA: Diagnosis present

## 2022-06-15 DIAGNOSIS — Z96652 Presence of left artificial knee joint: Secondary | ICD-10-CM | POA: Diagnosis not present

## 2022-06-15 DIAGNOSIS — Z87442 Personal history of urinary calculi: Secondary | ICD-10-CM | POA: Diagnosis not present

## 2022-06-15 DIAGNOSIS — R1032 Left lower quadrant pain: Secondary | ICD-10-CM | POA: Diagnosis not present

## 2022-06-15 DIAGNOSIS — I10 Essential (primary) hypertension: Secondary | ICD-10-CM | POA: Diagnosis present

## 2022-06-15 DIAGNOSIS — Z8619 Personal history of other infectious and parasitic diseases: Secondary | ICD-10-CM

## 2022-06-15 DIAGNOSIS — Z9842 Cataract extraction status, left eye: Secondary | ICD-10-CM | POA: Diagnosis not present

## 2022-06-15 DIAGNOSIS — K219 Gastro-esophageal reflux disease without esophagitis: Secondary | ICD-10-CM | POA: Diagnosis present

## 2022-06-15 DIAGNOSIS — K5792 Diverticulitis of intestine, part unspecified, without perforation or abscess without bleeding: Secondary | ICD-10-CM | POA: Diagnosis present

## 2022-06-15 DIAGNOSIS — E876 Hypokalemia: Secondary | ICD-10-CM | POA: Diagnosis not present

## 2022-06-15 DIAGNOSIS — R0902 Hypoxemia: Secondary | ICD-10-CM | POA: Diagnosis not present

## 2022-06-15 DIAGNOSIS — Z7984 Long term (current) use of oral hypoglycemic drugs: Secondary | ICD-10-CM | POA: Diagnosis not present

## 2022-06-15 DIAGNOSIS — E785 Hyperlipidemia, unspecified: Secondary | ICD-10-CM | POA: Diagnosis present

## 2022-06-15 DIAGNOSIS — K5732 Diverticulitis of large intestine without perforation or abscess without bleeding: Secondary | ICD-10-CM | POA: Diagnosis not present

## 2022-06-15 DIAGNOSIS — Z79899 Other long term (current) drug therapy: Secondary | ICD-10-CM | POA: Diagnosis not present

## 2022-06-15 DIAGNOSIS — Z9079 Acquired absence of other genital organ(s): Secondary | ICD-10-CM

## 2022-06-15 DIAGNOSIS — M1990 Unspecified osteoarthritis, unspecified site: Secondary | ICD-10-CM | POA: Diagnosis not present

## 2022-06-15 DIAGNOSIS — N400 Enlarged prostate without lower urinary tract symptoms: Secondary | ICD-10-CM | POA: Diagnosis present

## 2022-06-15 DIAGNOSIS — G249 Dystonia, unspecified: Secondary | ICD-10-CM | POA: Diagnosis present

## 2022-06-15 DIAGNOSIS — R1084 Generalized abdominal pain: Secondary | ICD-10-CM | POA: Diagnosis not present

## 2022-06-15 DIAGNOSIS — Z8249 Family history of ischemic heart disease and other diseases of the circulatory system: Secondary | ICD-10-CM | POA: Diagnosis not present

## 2022-06-15 DIAGNOSIS — E1169 Type 2 diabetes mellitus with other specified complication: Secondary | ICD-10-CM | POA: Diagnosis present

## 2022-06-15 DIAGNOSIS — Z888 Allergy status to other drugs, medicaments and biological substances status: Secondary | ICD-10-CM

## 2022-06-15 DIAGNOSIS — Z9841 Cataract extraction status, right eye: Secondary | ICD-10-CM

## 2022-06-15 DIAGNOSIS — Z87891 Personal history of nicotine dependence: Secondary | ICD-10-CM

## 2022-06-15 DIAGNOSIS — I700 Atherosclerosis of aorta: Secondary | ICD-10-CM | POA: Diagnosis not present

## 2022-06-15 DIAGNOSIS — H409 Unspecified glaucoma: Secondary | ICD-10-CM | POA: Diagnosis not present

## 2022-06-15 DIAGNOSIS — Z79890 Hormone replacement therapy (postmenopausal): Secondary | ICD-10-CM

## 2022-06-15 LAB — COMPREHENSIVE METABOLIC PANEL
ALT: 18 U/L (ref 0–44)
AST: 21 U/L (ref 15–41)
Albumin: 4.1 g/dL (ref 3.5–5.0)
Alkaline Phosphatase: 62 U/L (ref 38–126)
Anion gap: 10 (ref 5–15)
BUN: 13 mg/dL (ref 8–23)
CO2: 25 mmol/L (ref 22–32)
Calcium: 9 mg/dL (ref 8.9–10.3)
Chloride: 102 mmol/L (ref 98–111)
Creatinine, Ser: 0.87 mg/dL (ref 0.61–1.24)
GFR, Estimated: >60 mL/min (ref >60)
Glucose, Bld: 141 mg/dL — ABNORMAL HIGH (ref 70–99)
Potassium: 3.7 mmol/L (ref 3.5–5.1)
Sodium: 137 mmol/L (ref 135–145)
Total Bilirubin: 1.1 mg/dL (ref 0.3–1.2)
Total Protein: 7.6 g/dL (ref 6.5–8.1)

## 2022-06-15 LAB — URINALYSIS, ROUTINE W REFLEX MICROSCOPIC
Bilirubin Urine: NEGATIVE
Glucose, UA: NEGATIVE mg/dL
Ketones, ur: NEGATIVE mg/dL
Leukocytes,Ua: NEGATIVE
Nitrite: NEGATIVE
Protein, ur: 30 mg/dL — AB
RBC / HPF: >50 RBC/hpf (ref 0–5)
Specific Gravity, Urine: 1.021 (ref 1.005–1.030)
pH: 5 (ref 5.0–8.0)

## 2022-06-15 LAB — CBC
HCT: 47.5 % (ref 39.0–52.0)
Hemoglobin: 15.5 g/dL (ref 13.0–17.0)
MCH: 29.2 pg (ref 26.0–34.0)
MCHC: 32.6 g/dL (ref 30.0–36.0)
MCV: 89.6 fL (ref 80.0–100.0)
Platelets: 214 10*3/uL (ref 150–400)
RBC: 5.3 MIL/uL (ref 4.22–5.81)
RDW: 12.4 % (ref 11.5–15.5)
WBC: 10.7 10*3/uL — ABNORMAL HIGH (ref 4.0–10.5)
nRBC: 0 % (ref 0.0–0.2)

## 2022-06-15 LAB — GLUCOSE, CAPILLARY
Glucose-Capillary: 150 mg/dL — ABNORMAL HIGH (ref 70–99)
Glucose-Capillary: 170 mg/dL — ABNORMAL HIGH (ref 70–99)
Glucose-Capillary: 181 mg/dL — ABNORMAL HIGH (ref 70–99)
Glucose-Capillary: 97 mg/dL (ref 70–99)

## 2022-06-15 LAB — LIPASE, BLOOD: Lipase: 45 U/L (ref 11–51)

## 2022-06-15 MED ORDER — PIPERACILLIN-TAZOBACTAM 3.375 G IVPB
3.3750 g | Freq: Three times a day (TID) | INTRAVENOUS | Status: DC
  Administered 2022-06-15 – 2022-06-18 (×9): 3.375 g via INTRAVENOUS
  Filled 2022-06-15 (×9): qty 50

## 2022-06-15 MED ORDER — ENOXAPARIN SODIUM 40 MG/0.4ML IJ SOSY
40.0000 mg | PREFILLED_SYRINGE | INTRAMUSCULAR | Status: DC
  Administered 2022-06-15 – 2022-06-17 (×3): 40 mg via SUBCUTANEOUS
  Filled 2022-06-15 (×3): qty 0.4

## 2022-06-15 MED ORDER — PANTOPRAZOLE SODIUM 40 MG PO TBEC
40.0000 mg | DELAYED_RELEASE_TABLET | Freq: Every day | ORAL | Status: DC
  Administered 2022-06-15 – 2022-06-18 (×4): 40 mg via ORAL
  Filled 2022-06-15 (×4): qty 1

## 2022-06-15 MED ORDER — ONDANSETRON HCL 4 MG/2ML IJ SOLN: 4.0000 mg | Freq: Four times a day (QID) | INTRAMUSCULAR | Status: DC | PRN

## 2022-06-15 MED ORDER — MORPHINE SULFATE (PF) 4 MG/ML IV SOLN
4.0000 mg | Freq: Once | INTRAVENOUS | Status: AC
  Administered 2022-06-15: 4 mg via INTRAVENOUS
  Filled 2022-06-15: qty 1

## 2022-06-15 MED ORDER — BENAZEPRIL HCL 10 MG PO TABS
10.0000 mg | ORAL_TABLET | Freq: Every day | ORAL | Status: DC
  Administered 2022-06-16 – 2022-06-18 (×3): 10 mg via ORAL
  Filled 2022-06-15 (×5): qty 1

## 2022-06-15 MED ORDER — ATORVASTATIN CALCIUM 20 MG PO TABS
20.0000 mg | ORAL_TABLET | Freq: Every day | ORAL | Status: DC
  Administered 2022-06-15 – 2022-06-17 (×3): 20 mg via ORAL
  Filled 2022-06-15 (×3): qty 1

## 2022-06-15 MED ORDER — BENAZEPRIL-HYDROCHLOROTHIAZIDE 10-12.5 MG PO TABS: 1.0000 | ORAL_TABLET | Freq: Every day | ORAL | Status: DC

## 2022-06-15 MED ORDER — HYDROMORPHONE HCL 1 MG/ML IJ SOLN
0.5000 mg | INTRAMUSCULAR | Status: DC | PRN
  Filled 2022-06-15: qty 0.5

## 2022-06-15 MED ORDER — METOPROLOL TARTRATE 25 MG PO TABS
12.5000 mg | ORAL_TABLET | Freq: Two times a day (BID) | ORAL | Status: DC
  Administered 2022-06-15 – 2022-06-17 (×4): 12.5 mg via ORAL
  Filled 2022-06-15 (×7): qty 1

## 2022-06-15 MED ORDER — LEVOTHYROXINE SODIUM 50 MCG PO TABS
75.0000 ug | ORAL_TABLET | Freq: Every day | ORAL | Status: DC
  Administered 2022-06-16 – 2022-06-18 (×3): 75 ug via ORAL
  Filled 2022-06-15 (×3): qty 2

## 2022-06-15 MED ORDER — INSULIN ASPART 100 UNIT/ML IJ SOLN
0.0000 [IU] | INTRAMUSCULAR | Status: DC
  Administered 2022-06-15 (×2): 2 [IU] via SUBCUTANEOUS
  Administered 2022-06-15: 1 [IU] via SUBCUTANEOUS
  Filled 2022-06-15 (×3): qty 1

## 2022-06-15 MED ORDER — HYDROMORPHONE HCL 1 MG/ML IJ SOLN
0.5000 mg | Freq: Once | INTRAMUSCULAR | Status: AC
  Administered 2022-06-15: 0.5 mg via INTRAVENOUS
  Filled 2022-06-15: qty 0.5

## 2022-06-15 MED ORDER — ONDANSETRON HCL 4 MG/2ML IJ SOLN
4.0000 mg | Freq: Once | INTRAMUSCULAR | Status: AC
  Administered 2022-06-15: 4 mg via INTRAVENOUS
  Filled 2022-06-15: qty 2

## 2022-06-15 MED ORDER — LACTATED RINGERS IV BOLUS
1000.0000 mL | Freq: Once | INTRAVENOUS | Status: AC
  Administered 2022-06-15: 1000 mL via INTRAVENOUS

## 2022-06-15 MED ORDER — SODIUM CHLORIDE 0.9 % IV SOLN: INTRAVENOUS | Status: DC

## 2022-06-15 MED ORDER — IOHEXOL 300 MG/ML  SOLN
100.0000 mL | Freq: Once | INTRAMUSCULAR | Status: AC | PRN
  Administered 2022-06-15: 100 mL via INTRAVENOUS

## 2022-06-15 MED ORDER — ONDANSETRON HCL 4 MG PO TABS: 4.0000 mg | ORAL_TABLET | Freq: Four times a day (QID) | ORAL | Status: DC | PRN

## 2022-06-15 MED ORDER — SODIUM CHLORIDE 0.9 % IV SOLN
3.0000 g | Freq: Once | INTRAVENOUS | Status: AC
  Administered 2022-06-15: 3 g via INTRAVENOUS
  Filled 2022-06-15: qty 8

## 2022-06-15 MED ORDER — HYDROCHLOROTHIAZIDE 12.5 MG PO TABS
12.5000 mg | ORAL_TABLET | Freq: Every day | ORAL | Status: DC
  Administered 2022-06-15 – 2022-06-18 (×4): 12.5 mg via ORAL
  Filled 2022-06-15 (×4): qty 1

## 2022-06-15 NOTE — ED Notes (Signed)
Called CT to let them know pt has a new IV.

## 2022-06-15 NOTE — Assessment & Plan Note (Signed)
PPI ?

## 2022-06-15 NOTE — ED Triage Notes (Signed)
First RN Note: pt to ED via [facility] with c/o LLQ abd pain that he rates 6/10. Per EMS pt states pain started last night, hx of diverticulitis, last PO intake at 11 am yesterday. Per EMS pt also c/o nausea.     150/80 80HR 98% RA 98 oral

## 2022-06-15 NOTE — ED Triage Notes (Signed)
Pt to ED via [facility] c/o LLQ pain that started 3 days ago but got worse last night. Pt with hx of diverticulitis. Denies any CP, SOB, dizziness, fevers.

## 2022-06-15 NOTE — Assessment & Plan Note (Signed)
SSI A1c 

## 2022-06-15 NOTE — ED Provider Notes (Signed)
[facility] Provider Note    Event Date/Time   First MD Initiated Contact with Patient 06/15/22 0703     (approximate)   History   Chief Complaint Abdominal Pain   HPI  Ross Riley is a 72 y.o. male with past medical history of hypertension, diabetes, diverticulitis, and C. difficile colitis who presents to the ED complaining of abdominal pain.  History is limited as patient is Spanish-speaking only, history obtained via interpreter [phone].  Patient reports that he has been dealing with intermittent pain in the left lower quadrant of his abdomen for the past 3 days that became more constant and severe last night.  He describes the pain as sharp and stabbing, not exacerbated or alleviated by anything in particular.  It has been associated with nausea but he denies any vomiting or changes in his bowel movements.  He has not noticed any blood in his stool and denies any dysuria, hematuria, fever, or flank pain.  He describes symptoms as similar to prior episodes of diverticulitis.     Physical Exam   Triage Vital Signs: ED Triage Vitals  Enc Vitals Group     BP 06/15/22 0607 126/88     Pulse Rate 06/15/22 0607 86     Resp 06/15/22 0607 20     Temp 06/15/22 0607 98.8 F (37.1 C)     Temp Source 06/15/22 0607 Oral     SpO2 06/15/22 0607 98 %     Weight 06/15/22 0608 168 lb (76.2 kg)     Height --      Head Circumference --      Peak Flow --      Pain Score 06/15/22 0608 6     Pain Loc --      Pain Edu? --      Excl. in GC? --     Most recent vital signs: Vitals:   06/15/22 0607 06/15/22 0810  BP: 126/88 111/72  Pulse: 86 69  Resp: 20 19  Temp: 98.8 F (37.1 C) 98.6 F (37 C)  SpO2: 98% 97%    Constitutional: Alert and oriented. Eyes: Conjunctivae are normal. Head: Atraumatic. Nose: No congestion/rhinnorhea. Mouth/Throat: Mucous membranes are moist.  Cardiovascular: Normal rate, regular rhythm. Grossly normal heart sounds.  2+  radial pulses bilaterally. Respiratory: Normal respiratory effort.  No retractions. Lungs CTAB. Gastrointestinal: Soft and tender to palpation in the left lower quadrant with voluntary guarding, no distention noted. Musculoskeletal: No lower extremity tenderness nor edema.  Neurologic:  Normal speech and language. No gross focal neurologic deficits are appreciated.    ED Results / Procedures / Treatments   Labs (all labs ordered are listed, but only abnormal results are displayed) Labs Reviewed  COMPREHENSIVE METABOLIC PANEL - Abnormal; Notable for the following components:      Result Value   Glucose, Bld 141 (*)    All other components within normal limits  CBC - Abnormal; Notable for the following components:   WBC 10.7 (*)    All other components within normal limits  URINALYSIS, ROUTINE W REFLEX MICROSCOPIC - Abnormal; Notable for the following components:   Color, Urine YELLOW (*)    APPearance HAZY (*)    Hgb urine dipstick LARGE (*)    Protein, ur 30 (*)    Bacteria, UA RARE (*)    All other components within normal limits  LIPASE, BLOOD   RADIOLOGY CT abdomen/pelvis reviewed and interpreted by me with inflammatory changes in the left  lower quadrant consistent with diverticulitis, no abscess noted.  PROCEDURES:  Critical Care performed: No  Procedures   MEDICATIONS ORDERED IN ED: Medications  Ampicillin-Sulbactam (UNASYN) 3 g in sodium chloride 0.9 % 100 mL IVPB (has no administration in time range)  lactated ringers bolus 1,000 mL (1,000 mLs Intravenous New Bag/Given 06/15/22 0826)  morphine (PF) 4 MG/ML injection 4 mg (4 mg Intravenous Given 06/15/22 0822)  ondansetron (ZOFRAN) injection 4 mg (4 mg Intravenous Given 06/15/22 0819)  iohexol (OMNIPAQUE) 300 MG/ML solution 100 mL (100 mLs Intravenous Contrast Given 06/15/22 0748)     IMPRESSION / MDM / ASSESSMENT AND PLAN / ED COURSE  I reviewed the triage vital signs and the nursing notes.                               72 y.o. male with past medical history of hypertension, diabetes, diverticulitis, and C. difficile colitis who presents to the ED with 3 days of intermittent left lower quadrant abdominal pain that is now constant and severe since last night.  Patient's presentation is most consistent with acute presentation with potential threat to life or bodily function.  Differential diagnosis includes, but is not limited to, diverticulitis, intra-abdominal abscess, bowel perforation, kidney stone, UTI, colitis.  Patient uncomfortable appearing but in no acute distress, vital signs are unremarkable.  Pain is reproducible with palpation in the left lower quadrant and we will further assess with CT scan for diverticulitis, any potential complication, or other source of his pain.  We will treat symptomatically with IV morphine and Zofran, hydrate with IV fluids.  Labs thus far are reassuring with no significant anemia, leukocytosis, electrolyte abnormality, or AKI.  LFTs and lipase are unremarkable, urinalysis shows some red blood cells but no evidence of infection.  CT of abdomen/pelvis is remarkable for moderate to severe diverticulitis, no apparent complication noted.  On reassessment, patient reports pain is improved but he is still quite uncomfortable.  Given significant ongoing pain, patient was offered admission and does prefer to stay at the hospital for pain control in addition to antibiotics.  We will treat with IV Unasyn, case discussed with hospitalist for admission.      FINAL CLINICAL IMPRESSION(S) / ED DIAGNOSES   Final diagnoses:  Diverticulitis     Rx / DC Orders   ED Discharge Orders     None        Note:  This document was prepared using Dragon voice recognition software and may include unintentional dictation errors.   Chesley Noon, MD 06/15/22 1000

## 2022-06-15 NOTE — H&P (Signed)
History and Physical    Patient: Ross Riley [id number]:[id number] DOB: 1951-01-09 DOA: 06/15/2022 DOS: the patient was seen and examined on 06/15/2022 PCP: Smitty Cords, DO  Patient coming from: Home  Chief Complaint:  Chief Complaint  Patient presents with   Abdominal Pain   HPI: Ross Riley is a 72 y.o. male with medical history significant of obesity, BPH, GERD, hypertension, hyperlipidemia presenting with diverticulitis.  Patient reports approximately 3 days of left-sided abdominal pain.  Symptoms have progressively worsening.  No reported nausea or diarrhea.  No reported chest pain or shortness of breath.  Reports remote history of diagnosis of diverticulosis in the past associated with colonoscopy roughly 10 years ago.  No alcohol or tobacco use.  Symptoms progressively worsened over the course of 3 days to the point of becoming unbearable.  No reported bloody bowel movements Presented to the ER afebrile, hemodynamically stable.  Satting well on room air.  White count 10.7, hemoglobin 15.5, creatinine 0.87, glucose 141.  CT abdomen pelvis with moderate to severe diverticulitis in the proximal sigmoid colon.  Otherwise stable findings. Review of Systems: As mentioned in the history of present illness. All other systems reviewed and are negative. Past Medical History:  Diagnosis Date   Arthritis    BPH (benign prostatic hyperplasia)    Chronic kidney disease    kidney stones   Dystonia    ED (erectile dysfunction)    GERD (gastroesophageal reflux disease)    Glaucoma (increased eye pressure)    Hematuria    Hemorrhoids    Hyperlipidemia    Hypertension    Thyroid disease    Past Surgical History:  Procedure Laterality Date   CATARACT EXTRACTION Bilateral    EXTRACORPOREAL SHOCK WAVE LITHOTRIPSY Right    JOINT REPLACEMENT Left 08/2016   left knee, [place]   KIDNEY STONE SURGERY  2009   PROSTATE SURGERY  2011   TRANSURETHRAL RESECTION OF PROSTATE N/A 12/06/2014    Procedure: TRANSURETHRAL RESECTION OF THE PROSTATE (TURP);  Surgeon: Hildred Laser, MD;  Location: [facility] ORS;  Service: Urology;  Laterality: N/A;   Social History:  reports that he quit smoking about 31 years ago. His smoking use included cigarettes. He has a 32.00 pack-year smoking history. He has quit using smokeless tobacco. He reports that he does not currently use alcohol after a past usage of about 1.0 standard drink of alcohol per week. He reports that he does not use drugs.  Allergies  Allergen Reactions   Metformin And Related Other (See Comments)    Headache and dizzines   Metformin Hcl Other (See Comments)    headache and dizziness    Family History  Problem Relation Age of Onset   Heart disease Mother     Prior to Admission medications   Medication Sig Start Date End Date Taking? Authorizing Provider  atorvastatin (LIPITOR) 20 MG tablet Take 1 tablet (20 mg total) by mouth at bedtime. 05/14/22  Yes Karamalegos, Netta Neat, DO  benazepril-hydrochlorthiazide (LOTENSIN HCT) 10-12.5 MG tablet TAKE 1 TABLET BY MOUTH DAILY 04/17/22  Yes Karamalegos, Netta Neat, DO  dorzolamide-timolol (COSOPT) 22.3-6.8 MG/ML ophthalmic solution dorzolamide 22.3 mg-timolol 6.8 mg/mL eye drops   Yes [provider]  gabapentin (NEURONTIN) 300 MG capsule Take 1 capsule (300 mg total) by mouth 2 (two) times daily. 04/25/22  Yes Karamalegos, Alexander J, DO  glipiZIDE (GLUCOTROL XL) 5 MG 24 hr tablet TAKE 1 TABLET(5 MG) BY MOUTH DAILY WITH BREAKFAST 12/20/21  Yes Saralyn Pilar  J, DO  latanoprost (XALATAN) 0.005 % ophthalmic solution INT 1 GTT INTO OU QHS 10/20/17  Yes [provider]  levothyroxine (SYNTHROID) 75 MCG tablet Take 75 mcg by mouth. 09/13/18  Yes [provider]  metoprolol tartrate (LOPRESSOR) 25 MG tablet TAKE 1/2 TABLET(12.5 MG) BY MOUTH TWICE DAILY 04/25/22  Yes Karamalegos, Netta Neat, DO  omeprazole (PRILOSEC) 40 MG capsule TAKE 1 CAPSULE(40 MG)  BY MOUTH DAILY BEFORE BREAKFAST 01/28/22  Yes Karamalegos, Netta Neat, DO  ACCU-CHEK FASTCLIX LANCETS MISC check blood sugar up to 1 time daily as directed Patient not taking: Reported on 11/05/2021 07/24/17   Smitty Cords, DO  Alcohol Swabs (B-D SINGLE USE SWABS REGULAR) PADS check blood sugar up to 1 time daily as directed Patient not taking: Reported on 11/05/2021 07/24/17   Smitty Cords, DO  Blood Glucose Monitoring Suppl (ACCU-CHEK AVIVA PLUS) w/Device KIT Use device to check blood sugar up to 1 time daily as directed 07/24/17   Smitty Cords, DO  ezetimibe (ZETIA) 10 MG tablet TAKE 1 TABLET(10 MG) BY MOUTH DAILY Patient not taking: Reported on 06/15/2022 03/14/22   Antonieta Iba, MD  glucose blood test strip check blood sugar up to 1 time daily as directed 07/24/17   Smitty Cords, DO  meclizine (ANTIVERT) 25 MG tablet TAKE 1 TABLET(25 MG) BY MOUTH THREE TIMES DAILY AS NEEDED FOR DIZZINESS Patient not taking: Reported on 06/15/2022 09/24/21   Smitty Cords, DO  naproxen (NAPROSYN) 500 MG tablet Take 1 tablet (500 mg total) by mouth 2 (two) times daily with a meal. For 2-4 weeks then as needed Patient not taking: Reported on 06/15/2022 05/29/21   Smitty Cords, DO  promethazine-dextromethorphan (PROMETHAZINE-DM) 6.25-15 MG/5ML syrup TAKE 5 ML BY MOUTH THREE TIMES DAILY AS NEEDED FOR COUGH Patient not taking: Reported on 06/15/2022 02/19/22   Smitty Cords, DO  sildenafil (VIAGRA) 100 MG tablet Take 1 tablet (100 mg total) by mouth daily as needed for erectile dysfunction. Take two hours prior to intercourse on an empty stomach Patient not taking: Reported on 06/15/2022 03/16/20   Smitty Cords, DO    Physical Exam: Vitals:   06/15/22 0608 06/15/22 0810 06/15/22 0830 06/15/22 1005  BP:  111/72 108/68 (!) 152/140  Pulse:  69 63 62  Resp:  19 (!) 26 17  Temp:  98.6 F (37 C)    TempSrc:  Oral    SpO2:  97% 92%  98%  Weight: 76.2 kg      Physical Exam Constitutional:      General: He is not in acute distress.    Appearance: He is obese.  HENT:     Head: Normocephalic.     Mouth/Throat:     Mouth: Mucous membranes are moist.  Eyes:     Pupils: Pupils are equal, round, and reactive to light.  Cardiovascular:     Rate and Rhythm: Normal rate and regular rhythm.  Pulmonary:     Effort: Pulmonary effort is normal.     Breath sounds: Normal breath sounds.  Abdominal:     General: Abdomen is flat. Bowel sounds are normal.     Comments: + minimal to mild guarding   Musculoskeletal:        General: Normal range of motion.     Cervical back: Normal range of motion.  Skin:    General: Skin is warm.  Neurological:     General: No focal deficit present.  Psychiatric:  Mood and Affect: Mood normal.     Data Reviewed:  There are no new results to review at this time. CT Abdomen Pelvis W Contrast CLINICAL DATA:  Acute left lower quadrant abdominal pain.  EXAM: CT ABDOMEN AND PELVIS WITH CONTRAST  TECHNIQUE: Multidetector CT imaging of the abdomen and pelvis was performed using the standard protocol following bolus administration of intravenous contrast.  RADIATION DOSE REDUCTION: This exam was performed according to the departmental dose-optimization program which includes automated exposure control, adjustment of the mA and/or kV according to patient size and/or use of iterative reconstruction technique.  CONTRAST:  OMNIPAQUE IOHEXOL 300 MG/ML  SOLN  COMPARISON:  September 15, 2006.  FINDINGS: Lower chest: No acute abnormality.  Hepatobiliary: No focal liver abnormality is seen. No gallstones, gallbladder wall thickening, or biliary dilatation.  Pancreas: Unremarkable. No pancreatic ductal dilatation or surrounding inflammatory changes.  Spleen: Normal in size without focal abnormality.  Adrenals/Urinary Tract: Adrenal glands are unremarkable. Kidneys are normal,  without renal calculi, focal lesion, or hydronephrosis. Bladder is unremarkable.  Stomach/Bowel: Stomach and appendix are unremarkable. There is no evidence of bowel obstruction. Moderate to severe diverticulitis is seen involving the proximal sigmoid colon. No abscess is noted.  Vascular/Lymphatic: Aortic atherosclerosis. No enlarged abdominal or pelvic lymph nodes.  Reproductive: Stable mild prostatic enlargement.  Other: Small fat containing left inguinal hernia. No ascites is noted.  Musculoskeletal: No acute or significant osseous findings.  IMPRESSION: Moderate to severe diverticulitis is seen involving the proximal sigmoid colon. No definite abscess is noted.  Stable mild prostatic enlargement.  Small fat containing left inguinal hernia.  Aortic Atherosclerosis (ICD10-I70.0).  Electronically Signed   By: Lupita Raider M.D.   On: 06/15/2022 09:29  Lab Results  Component Value Date   WBC 10.7 (H) 06/15/2022   HGB 15.5 06/15/2022   HCT 47.5 06/15/2022   MCV 89.6 06/15/2022   PLT 214 06/15/2022   Last metabolic panel Lab Results  Component Value Date   GLUCOSE 141 (H) 06/15/2022   NA 137 06/15/2022   K 3.7 06/15/2022   CL 102 06/15/2022   CO2 25 06/15/2022   BUN 13 06/15/2022   CREATININE 0.87 06/15/2022   GFRNONAA >60 06/15/2022   CALCIUM 9.0 06/15/2022   PROT 7.6 06/15/2022   ALBUMIN 4.1 06/15/2022   LABGLOB 3.0 06/19/2015   AGRATIO 1.4 06/19/2015   BILITOT 1.1 06/15/2022   ALKPHOS 62 06/15/2022   AST 21 06/15/2022   ALT 18 06/15/2022   ANIONGAP 10 06/15/2022    Assessment and Plan: * Diverticulitis Positive left-sided abdominal pain x 3 to 4 days with moderate to severe diverticulitis on CT imaging White count 10.7 IV Zosyn for infectious coverage Pain control Antiemetics Liquid diet Follow  Essential hypertension BP stable Titrate home regimen  Hyperlipidemia associated with type 2 diabetes mellitus Statin  Acid  reflux PPI  Type 2 diabetes mellitus with diabetic cataract, without long-term current use of insulin SSI A1c      Advance Care Planning:   Code Status: Full Code   Consults: None   Family Communication: No family at the bedside   Greater than 50% was spent in counseling and coordination of care with patient Total encounter time 80 minutes or more   Severity of Illness: The appropriate patient status for this patient is OBSERVATION. Observation status is judged to be reasonable and necessary in order to provide the required intensity of service to ensure the patient's safety. The patient's presenting symptoms, physical exam  findings, and initial radiographic and laboratory data in the context of their medical condition is felt to place them at decreased risk for further clinical deterioration. Furthermore, it is anticipated that the patient will be medically stable for discharge from the hospital within 2 midnights of admission.   Author: Floydene Flock, MD 06/15/2022 10:53 AM  For on call review [email or web address].

## 2022-06-15 NOTE — Assessment & Plan Note (Signed)
Positive left-sided abdominal pain x 3 to 4 days with moderate to severe diverticulitis on CT imaging White count 10.7 IV Zosyn for infectious coverage Pain control Antiemetics Liquid diet Follow

## 2022-06-15 NOTE — Assessment & Plan Note (Signed)
?   Statin.

## 2022-06-15 NOTE — Assessment & Plan Note (Signed)
BP stable Titrate home regimen 

## 2022-06-15 NOTE — ED Notes (Signed)
Patient has ready bed

## 2022-06-16 DIAGNOSIS — K5792 Diverticulitis of intestine, part unspecified, without perforation or abscess without bleeding: Secondary | ICD-10-CM | POA: Diagnosis not present

## 2022-06-16 LAB — CBC
HCT: 40.1 % (ref 39.0–52.0)
Hemoglobin: 13.2 g/dL (ref 13.0–17.0)
MCH: 29.4 pg (ref 26.0–34.0)
MCHC: 32.9 g/dL (ref 30.0–36.0)
MCV: 89.3 fL (ref 80.0–100.0)
Platelets: 166 10*3/uL (ref 150–400)
RBC: 4.49 MIL/uL (ref 4.22–5.81)
RDW: 12.4 % (ref 11.5–15.5)
WBC: 6.4 10*3/uL (ref 4.0–10.5)
nRBC: 0.3 % — ABNORMAL HIGH (ref 0.0–0.2)

## 2022-06-16 LAB — COMPREHENSIVE METABOLIC PANEL
ALT: 14 U/L (ref 0–44)
AST: 23 U/L (ref 15–41)
Albumin: 3.4 g/dL — ABNORMAL LOW (ref 3.5–5.0)
Alkaline Phosphatase: 44 U/L (ref 38–126)
Anion gap: 10 (ref 5–15)
BUN: 9 mg/dL (ref 8–23)
CO2: 27 mmol/L (ref 22–32)
Calcium: 8.4 mg/dL — ABNORMAL LOW (ref 8.9–10.3)
Chloride: 102 mmol/L (ref 98–111)
Creatinine, Ser: 0.81 mg/dL (ref 0.61–1.24)
GFR, Estimated: >60 mL/min (ref >60)
Glucose, Bld: 153 mg/dL — ABNORMAL HIGH (ref 70–99)
Potassium: 3.5 mmol/L (ref 3.5–5.1)
Sodium: 139 mmol/L (ref 135–145)
Total Bilirubin: 1.5 mg/dL — ABNORMAL HIGH (ref 0.3–1.2)
Total Protein: 6.4 g/dL — ABNORMAL LOW (ref 6.5–8.1)

## 2022-06-16 LAB — GLUCOSE, CAPILLARY
Glucose-Capillary: 82 mg/dL (ref 70–99)
Glucose-Capillary: 109 mg/dL — ABNORMAL HIGH (ref 70–99)
Glucose-Capillary: 182 mg/dL — ABNORMAL HIGH (ref 70–99)
Glucose-Capillary: 116 mg/dL — ABNORMAL HIGH (ref 70–99)
Glucose-Capillary: 134 mg/dL — ABNORMAL HIGH (ref 70–99)

## 2022-06-16 LAB — BASIC METABOLIC PANEL
Anion gap: 7 (ref 5–15)
BUN: 8 mg/dL (ref 8–23)
CO2: 27 mmol/L (ref 22–32)
Calcium: 8.7 mg/dL — ABNORMAL LOW (ref 8.9–10.3)
Chloride: 106 mmol/L (ref 98–111)
Creatinine, Ser: 0.95 mg/dL (ref 0.61–1.24)
GFR, Estimated: >60 mL/min (ref >60)
Glucose, Bld: 115 mg/dL — ABNORMAL HIGH (ref 70–99)
Potassium: 3.8 mmol/L (ref 3.5–5.1)
Sodium: 140 mmol/L (ref 135–145)

## 2022-06-16 MED ORDER — LATANOPROST 0.005 % OP SOLN
1.0000 [drp] | Freq: Every day | OPHTHALMIC | Status: DC
  Administered 2022-06-16 – 2022-06-17 (×2): 1 [drp] via OPHTHALMIC
  Filled 2022-06-16 (×2): qty 2.5

## 2022-06-16 MED ORDER — DORZOLAMIDE HCL-TIMOLOL MAL 2-0.5 % OP SOLN
1.0000 [drp] | Freq: Two times a day (BID) | OPHTHALMIC | Status: DC
  Administered 2022-06-16 – 2022-06-18 (×4): 1 [drp] via OPHTHALMIC
  Filled 2022-06-16: qty 10

## 2022-06-16 MED ORDER — INSULIN ASPART 100 UNIT/ML IJ SOLN
0.0000 [IU] | Freq: Three times a day (TID) | INTRAMUSCULAR | Status: DC
  Administered 2022-06-16: 2 [IU] via SUBCUTANEOUS
  Filled 2022-06-16 (×2): qty 1

## 2022-06-16 NOTE — Progress Notes (Signed)
Progress Note   Patient: Ross Riley [id number]:[id number] DOB: January 30, 1951 DOA: 06/15/2022     0 DOS: the patient was seen and examined on 06/16/2022    Subjective: Patient seen and examined at bedside this morning Still complains of some abdominal pain Currently on full liquid diet being advanced as tolerated Denied nausea vomiting chest pain cough or urinary complaints  Brief hospital course: From HPI of Dr. Alvester Morin : "Ross Riley is a 72 y.o. male with medical history significant of obesity, BPH, GERD, hypertension, hyperlipidemia presenting with diverticulitis.  Patient reports approximately 3 days of left-sided abdominal pain.  Symptoms have progressively worsening.  No reported nausea or diarrhea.  No reported chest pain or shortness of breath.  Reports remote history of diagnosis of diverticulosis in the past associated with colonoscopy roughly 10 years ago.  No alcohol or tobacco use.  Symptoms progressively worsened over the course of 3 days to the point of becoming unbearable.  No reported bloody bowel movements Presented to the ER afebrile, hemodynamically stable.  Satting well on room air.  White count 10.7, hemoglobin 15.5, creatinine 0.87, glucose 141.  CT abdomen pelvis with moderate to severe diverticulitis in the proximal sigmoid colon.  Otherwise stable findings."   Assessment and Plan: Acute diverticulitis Positive left-sided abdominal pain x 3 to 4 days with moderate to severe diverticulitis on CT imaging White count 10.7 on admission Continue IV Zosyn for infectious coverage Continue current pain control Continue antiemetics Advancement of diet as tolerated   Essential hypertension BP stable Titrate home regimen   Hyperlipidemia associated with type 2 diabetes mellitus Statin   Acid reflux PPI   Type 2 diabetes mellitus with diabetic cataract, without long-term current use of insulin SSI A1c       Advance Care Planning:   Code Status: Full Code     Consults: None      Physical Exam:    General: He is not in acute distress.    Appearance: He is obese.  HENT:     Head: Normocephalic.     Mouth/Throat:     Mouth: Mucous membranes are moist.  Eyes:     Pupils: Pupils are equal, round, and reactive to light.  Cardiovascular:     Rate and Rhythm: Normal rate and regular rhythm.  Pulmonary:     Effort: Pulmonary effort is normal.     Breath sounds: Normal breath sounds.  Abdominal:     General: Abdominal tenderness to the mid abdomen  Musculoskeletal:        General: Normal range of motion.     Cervical back: Normal range of motion.  Skin:    General: Skin is warm.  Neurological:     General: No focal deficit present.  Psychiatric:         Data Reviewed: I have reviewed patient's laboratory results from this morning showing sodium 139 potassium 3.5 and glucose 153  Family Communication: None present at bedside    Planned Discharge Destination: Home    Time spent: 45 minutes   Vitals:   06/15/22 1930 06/15/22 2100 06/16/22 0412 06/16/22 0748  BP: 131/75  110/70 114/68  Pulse: (!) 59 62 (!) 51 (!) 51  Resp: 20  20 18   Temp: (!) 97.5 F (36.4 C)  97.8 F (36.6 C) 98.4 F (36.9 C)  TempSrc: Oral  Oral   SpO2: 99%  99% 97%  Weight:      Height:        Author: Criss Alvine  Gus Puma, MD 06/16/2022 3:58 PM  For on call review [email or web address].

## 2022-06-16 NOTE — Plan of Care (Signed)
Pt alert and oriented x4. Up with 1 assist with cane. No prns given. Pt denied pain. Compliant of only tenderness to abdomen. Afebrile. Vitals stable.  Problem: Education: Goal: Ability to describe self-care measures that may prevent or decrease complications (Diabetes Survival Skills Education) will improve Outcome: Progressing Goal: Individualized Educational Video(s) Outcome: Progressing   Problem: Coping: Goal: Ability to adjust to condition or change in health will improve Outcome: Progressing   Problem: Fluid Volume: Goal: Ability to maintain a balanced intake and output will improve Outcome: Progressing   Problem: Health Behavior/Discharge Planning: Goal: Ability to identify and utilize available resources and services will improve Outcome: Progressing Goal: Ability to manage health-related needs will improve Outcome: Progressing   Problem: Metabolic: Goal: Ability to maintain appropriate glucose levels will improve Outcome: Progressing   Problem: Nutritional: Goal: Maintenance of adequate nutrition will improve Outcome: Progressing Goal: Progress toward achieving an optimal weight will improve Outcome: Progressing   Problem: Skin Integrity: Goal: Risk for impaired skin integrity will decrease Outcome: Progressing   Problem: Tissue Perfusion: Goal: Adequacy of tissue perfusion will improve Outcome: Progressing   Problem: Education: Goal: Knowledge of General Education information will improve Description: Including pain rating scale, medication(s)/side effects and non-pharmacologic comfort measures Outcome: Progressing   Problem: Health Behavior/Discharge Planning: Goal: Ability to manage health-related needs will improve Outcome: Progressing   Problem: Clinical Measurements: Goal: Ability to maintain clinical measurements within normal limits will improve Outcome: Progressing Goal: Will remain free from infection Outcome: Progressing Goal: Diagnostic test  results will improve Outcome: Progressing Goal: Respiratory complications will improve Outcome: Progressing Goal: Cardiovascular complication will be avoided Outcome: Progressing   Problem: Activity: Goal: Risk for activity intolerance will decrease Outcome: Progressing   Problem: Nutrition: Goal: Adequate nutrition will be maintained Outcome: Progressing   Problem: Coping: Goal: Level of anxiety will decrease Outcome: Progressing   Problem: Elimination: Goal: Will not experience complications related to bowel motility Outcome: Progressing Goal: Will not experience complications related to urinary retention Outcome: Progressing   Problem: Pain Managment: Goal: General experience of comfort will improve Outcome: Progressing   Problem: Safety: Goal: Ability to remain free from injury will improve Outcome: Progressing   Problem: Skin Integrity: Goal: Risk for impaired skin integrity will decrease Outcome: Progressing

## 2022-06-17 DIAGNOSIS — Z7984 Long term (current) use of oral hypoglycemic drugs: Secondary | ICD-10-CM | POA: Diagnosis not present

## 2022-06-17 DIAGNOSIS — Z888 Allergy status to other drugs, medicaments and biological substances status: Secondary | ICD-10-CM | POA: Diagnosis not present

## 2022-06-17 DIAGNOSIS — Z8619 Personal history of other infectious and parasitic diseases: Secondary | ICD-10-CM | POA: Diagnosis not present

## 2022-06-17 DIAGNOSIS — Z79890 Hormone replacement therapy (postmenopausal): Secondary | ICD-10-CM | POA: Diagnosis not present

## 2022-06-17 DIAGNOSIS — I10 Essential (primary) hypertension: Secondary | ICD-10-CM | POA: Diagnosis present

## 2022-06-17 DIAGNOSIS — G249 Dystonia, unspecified: Secondary | ICD-10-CM | POA: Diagnosis present

## 2022-06-17 DIAGNOSIS — Z9841 Cataract extraction status, right eye: Secondary | ICD-10-CM | POA: Diagnosis not present

## 2022-06-17 DIAGNOSIS — E785 Hyperlipidemia, unspecified: Secondary | ICD-10-CM | POA: Diagnosis present

## 2022-06-17 DIAGNOSIS — N400 Enlarged prostate without lower urinary tract symptoms: Secondary | ICD-10-CM | POA: Diagnosis present

## 2022-06-17 DIAGNOSIS — E1169 Type 2 diabetes mellitus with other specified complication: Secondary | ICD-10-CM | POA: Diagnosis present

## 2022-06-17 DIAGNOSIS — N529 Male erectile dysfunction, unspecified: Secondary | ICD-10-CM | POA: Diagnosis present

## 2022-06-17 DIAGNOSIS — Z9079 Acquired absence of other genital organ(s): Secondary | ICD-10-CM | POA: Diagnosis not present

## 2022-06-17 DIAGNOSIS — K219 Gastro-esophageal reflux disease without esophagitis: Secondary | ICD-10-CM | POA: Diagnosis present

## 2022-06-17 DIAGNOSIS — Z8249 Family history of ischemic heart disease and other diseases of the circulatory system: Secondary | ICD-10-CM | POA: Diagnosis not present

## 2022-06-17 DIAGNOSIS — K5792 Diverticulitis of intestine, part unspecified, without perforation or abscess without bleeding: Secondary | ICD-10-CM | POA: Diagnosis not present

## 2022-06-17 DIAGNOSIS — R1032 Left lower quadrant pain: Secondary | ICD-10-CM | POA: Diagnosis present

## 2022-06-17 DIAGNOSIS — Z79899 Other long term (current) drug therapy: Secondary | ICD-10-CM | POA: Diagnosis not present

## 2022-06-17 DIAGNOSIS — K5732 Diverticulitis of large intestine without perforation or abscess without bleeding: Secondary | ICD-10-CM | POA: Diagnosis present

## 2022-06-17 DIAGNOSIS — E876 Hypokalemia: Secondary | ICD-10-CM | POA: Diagnosis present

## 2022-06-17 DIAGNOSIS — Z87442 Personal history of urinary calculi: Secondary | ICD-10-CM | POA: Diagnosis not present

## 2022-06-17 DIAGNOSIS — Z9842 Cataract extraction status, left eye: Secondary | ICD-10-CM | POA: Diagnosis not present

## 2022-06-17 DIAGNOSIS — H409 Unspecified glaucoma: Secondary | ICD-10-CM | POA: Diagnosis present

## 2022-06-17 DIAGNOSIS — Z96652 Presence of left artificial knee joint: Secondary | ICD-10-CM | POA: Diagnosis present

## 2022-06-17 DIAGNOSIS — Z87891 Personal history of nicotine dependence: Secondary | ICD-10-CM | POA: Diagnosis not present

## 2022-06-17 DIAGNOSIS — M1990 Unspecified osteoarthritis, unspecified site: Secondary | ICD-10-CM | POA: Diagnosis present

## 2022-06-17 LAB — CBC WITH DIFFERENTIAL/PLATELET
Abs Immature Granulocytes: 0.02 10*3/uL (ref 0.00–0.07)
Basophils Absolute: 0 10*3/uL (ref 0.0–0.1)
Basophils Relative: 1 %
Eosinophils Absolute: 0.2 10*3/uL (ref 0.0–0.5)
Eosinophils Relative: 4 %
HCT: 39.5 % (ref 39.0–52.0)
Hemoglobin: 12.8 g/dL — ABNORMAL LOW (ref 13.0–17.0)
Immature Granulocytes: 0 %
Lymphocytes Relative: 54 %
Lymphs Abs: 3 10*3/uL (ref 0.7–4.0)
MCH: 29 pg (ref 26.0–34.0)
MCHC: 32.4 g/dL (ref 30.0–36.0)
MCV: 89.4 fL (ref 80.0–100.0)
Monocytes Absolute: 0.6 10*3/uL (ref 0.1–1.0)
Monocytes Relative: 10 %
Neutro Abs: 1.7 10*3/uL (ref 1.7–7.7)
Neutrophils Relative %: 31 %
Platelets: 181 10*3/uL (ref 150–400)
RBC: 4.42 MIL/uL (ref 4.22–5.81)
RDW: 12.2 % (ref 11.5–15.5)
WBC: 5.6 10*3/uL (ref 4.0–10.5)
nRBC: 0 % (ref 0.0–0.2)

## 2022-06-17 LAB — GLUCOSE, CAPILLARY
Glucose-Capillary: 104 mg/dL — ABNORMAL HIGH (ref 70–99)
Glucose-Capillary: 156 mg/dL — ABNORMAL HIGH (ref 70–99)
Glucose-Capillary: 162 mg/dL — ABNORMAL HIGH (ref 70–99)
Glucose-Capillary: 132 mg/dL — ABNORMAL HIGH (ref 70–99)

## 2022-06-17 MED ORDER — ACETAMINOPHEN 325 MG PO TABS
650.0000 mg | ORAL_TABLET | Freq: Four times a day (QID) | ORAL | Status: DC | PRN
  Administered 2022-06-17: 650 mg via ORAL

## 2022-06-17 NOTE — TOC Initial Note (Signed)
Transition of Care [facility]) - Initial/Assessment Note    Patient Details  Name: Ross Riley MRN: [id number] Date of Birth: 21-Jan-1951  Transition of Care [facility]) CM/SW Contact:    Chapman Fitch, RN Phone Number: 06/17/2022, 4:18 PM  Clinical Narrative:                   Transition of Care [facility]) Screening Note   Patient Details  Name: Ross Riley Date of Birth: 10/12/1950   Transition of Care [facility]) CM/SW Contact:    Chapman Fitch, RN Phone Number: 06/17/2022, 4:18 PM    Transition of Care Department [facility]) has reviewed patient and no TOC needs have been identified at this time. We will continue to monitor patient advancement through interdisciplinary progression rounds. If new patient transition needs arise, please place a TOC consult.         Patient Goals and CMS Choice            Expected Discharge Plan and Services                                              Prior Living Arrangements/Services                       Activities of Daily Living Home Assistive Devices/Equipment: Cane (specify quad or straight) ADL Screening (condition at time of admission) Patient's cognitive ability adequate to safely complete daily activities?: Yes Is the patient deaf or have difficulty hearing?: No Does the patient have difficulty seeing, even when wearing glasses/contacts?: No Does the patient have difficulty concentrating, remembering, or making decisions?: No Patient able to express need for assistance with ADLs?: Yes Does the patient have difficulty dressing or bathing?: No Independently performs ADLs?: Yes (appropriate for developmental age) Does the patient have difficulty walking or climbing stairs?: No Weakness of Legs: None Weakness of Arms/Hands: None  Permission Sought/Granted                  Emotional Assessment              Admission diagnosis:  Diverticulitis [K57.92] Patient Active Problem List   Diagnosis  Date Noted   Epidermal inclusion cyst 06/09/2019   Erectile dysfunction due to arterial insufficiency 09/14/2018   Abnormal ejaculation 09/14/2018   Former smoker 07/08/2017   Hypothyroidism 07/08/2017   Hx of adenomatous colonic polyps 06/09/2017   History of diverticulitis 06/09/2017   History of Clostridium difficile colitis 06/09/2017   Recurrent colitis due to Clostridium difficile 03/25/2017   Chills (without fever) 03/25/2017   Diverticulitis 12/04/2016   Anemia 12/04/2016   Chronic right shoulder pain 11/24/2016   Other constipation 11/24/2016   BPPV (benign paroxysmal positional vertigo), bilateral 10/09/2016   Knee stiffness 10/03/2016   S/P TKR (total knee replacement), left 09/22/2016   Osteoarthritis of left knee 06/05/2016   Atypical intracranial meningioma 05/22/2016   Abdominal pain, LLQ (left lower quadrant) 02/11/2016   Seasonal allergies 01/24/2015   BPH (benign prostatic hyperplasia) 12/06/2014   Pain in joint, ankle and foot 10/26/2014   Gonalgia 10/26/2014   Osteoarthritis of multiple joints 10/26/2014   Bilateral cataracts 10/26/2014   ED (erectile dysfunction) of organic origin 10/26/2014   Acid reflux 10/26/2014   Hyperlipidemia associated with type 2 diabetes mellitus 10/26/2014   Essential hypertension 10/26/2014   Type 2  diabetes mellitus with diabetic cataract, without long-term current use of insulin 08/02/2014   Focal dystonia 07/22/2012   PCP:  Smitty Cords, DO Pharmacy:   [facility] [phone] - [place], [place] - [address] AT [facility] OF [address] [place] [address] [place] [address] Phone: [phone] Fax: [phone]     Social Determinants of Health (SDOH) Social History: SDOH Screenings   Food Insecurity: No Food Insecurity (06/15/2022)  Housing: Low Risk  (06/15/2022)  Transportation Needs: No Transportation Needs (06/15/2022)  Utilities: Not At Risk (06/15/2022)  Depression (PHQ2-9): Low Risk   (04/25/2022)  Financial Resource Strain: Low Risk  (01/01/2021)  Physical Activity: Insufficiently Active (01/01/2021)  Social Connections: Somewhat Isolated (04/07/2017)  Stress: No Stress Concern Present (01/01/2021)  Tobacco Use: Medium Risk (06/15/2022)   SDOH Interventions:     Readmission Risk Interventions     No data to display

## 2022-06-17 NOTE — Progress Notes (Signed)
Progress Note   Patient: Ross Riley UJW:[id number] DOB: [birth year] DOA: 06/15/2022     0 DOS: the patient was seen and examined on 06/17/2022    Subjective: Patient seen and examined at bedside this morning He admits to having loose stool today Still having abdominal pain however improving Denied nausea vomiting chest pain cough or urinary complaints   Brief hospital course: From HPI of Dr. Alvester Morin : "Ross Riley is a 72 y.o. male with medical history significant of obesity, BPH, GERD, hypertension, hyperlipidemia presenting with diverticulitis.  Patient reports approximately 3 days of left-sided abdominal pain.  Symptoms have progressively worsening.  No reported nausea or diarrhea.  No reported chest pain or shortness of breath.  Reports remote history of diagnosis of diverticulosis in the past associated with colonoscopy roughly 10 years ago.  No alcohol or tobacco use.  Symptoms progressively worsened over the course of 3 days to the point of becoming unbearable.  No reported bloody bowel movements Presented to the ER afebrile, hemodynamically stable.  Satting well on room air.  White count 10.7, hemoglobin 15.5, creatinine 0.87, glucose 141.  CT abdomen pelvis with moderate to severe diverticulitis in the proximal sigmoid colon.  Otherwise stable findings."     Assessment and Plan: Moderate to severe acute diverticulitis Positive left-sided abdominal pain x 3 to 4 days with moderate to severe diverticulitis on CT imaging White count 10.7 on admission Continue IV Zosyn for infectious coverage Continue current pain control Continue antiemetics Advancement of diet as tolerated   Essential hypertension BP stable Titrate home regimen   Hyperlipidemia  Continue statin   Acid reflux PPI   Type 2 diabetes mellitus with diabetic cataract, without long-term current use of insulin SSI A1c      Advance Care Planning:   Code Status: Full Code    Consults: None         Physical Exam:    General: He is not in acute distress.    Appearance: He is obese.  HENT:     Head: Normocephalic.     Mouth/Throat:     Mouth: Mucous membranes are moist.  Eyes:     Pupils: Pupils are equal, round, and reactive to light.  Cardiovascular:     Rate and Rhythm: Normal rate and regular rhythm.  Pulmonary:     Effort: Pulmonary effort is normal.     Breath sounds: Normal breath sounds.  Abdominal:     General: Abdominal tenderness to the mid abdomen  Musculoskeletal:        General: Normal range of motion.     Cervical back: Normal range of motion.  Skin:    General: Skin is warm.  Neurological:     General: No focal deficit present.  Psychiatric:         Data Reviewed: I have reviewed patient's laboratory results today showing WBC 5.6 hemoglobin 12.8   Family Communication: None present at bedside      Planned Discharge Destination: Home       Time spent: 40  minutes  Vitals:   06/16/22 0748 06/16/22 1859 06/17/22 0416 06/17/22 0808  BP: 114/68 114/67 139/84 139/81  Pulse: (!) 51 (!) 56 (!) 53 89  Resp: Temp: 98.4 F (36.9 C) 98.7 F (37.1 C) 98.6 F (37 C) 98 F (36.7 C)  TempSrc:  Oral Oral   SpO2: 97% 99% 99% 100%  Weight:      Height:  Author: Loyce Dys, MD 06/17/2022 2:21 PM  For on call review [email or web address].

## 2022-06-17 NOTE — Plan of Care (Addendum)
Pt alert and oriented x 4. Pt up adlib in room uses cane. Pt request to go by himself. Steady gait. Pt complaint of pain this am #2. Dilaudid only for severe pain. Questioned pt regarding meds for pain if he wanted to order. Pt complained of having loose bm this am.  Problem: Elimination: Goal: Will not experience complications related to bowel motility Outcome: Not Progressing   Problem: Education: Goal: Ability to describe self-care measures that may prevent or decrease complications (Diabetes Survival Skills Education) will improve Outcome: Progressing Goal: Individualized Educational Video(s) Outcome: Progressing   Problem: Coping: Goal: Ability to adjust to condition or change in health will improve Outcome: Progressing   Problem: Fluid Volume: Goal: Ability to maintain a balanced intake and output will improve Outcome: Progressing   Problem: Health Behavior/Discharge Planning: Goal: Ability to identify and utilize available resources and services will improve Outcome: Progressing Goal: Ability to manage health-related needs will improve Outcome: Progressing   Problem: Metabolic: Goal: Ability to maintain appropriate glucose levels will improve Outcome: Progressing   Problem: Nutritional: Goal: Maintenance of adequate nutrition will improve Outcome: Progressing Goal: Progress toward achieving an optimal weight will improve Outcome: Progressing   Problem: Skin Integrity: Goal: Risk for impaired skin integrity will decrease Outcome: Progressing   Problem: Tissue Perfusion: Goal: Adequacy of tissue perfusion will improve Outcome: Progressing   Problem: Education: Goal: Knowledge of General Education information will improve Description: Including pain rating scale, medication(s)/side effects and non-pharmacologic comfort measures Outcome: Progressing   Problem: Health Behavior/Discharge Planning: Goal: Ability to manage health-related needs will improve Outcome:  Progressing   Problem: Clinical Measurements: Goal: Ability to maintain clinical measurements within normal limits will improve Outcome: Progressing Goal: Will remain free from infection Outcome: Progressing Goal: Diagnostic test results will improve Outcome: Progressing Goal: Respiratory complications will improve Outcome: Progressing Goal: Cardiovascular complication will be avoided Outcome: Progressing   Problem: Activity: Goal: Risk for activity intolerance will decrease Outcome: Progressing   Problem: Nutrition: Goal: Adequate nutrition will be maintained Outcome: Progressing   Problem: Coping: Goal: Level of anxiety will decrease Outcome: Progressing   Problem: Elimination: Goal: Will not experience complications related to urinary retention Outcome: Progressing   Problem: Pain Managment: Goal: General experience of comfort will improve Outcome: Progressing   Problem: Safety: Goal: Ability to remain free from injury will improve Outcome: Progressing   Problem: Skin Integrity: Goal: Risk for impaired skin integrity will decrease Outcome: Progressing

## 2022-06-18 DIAGNOSIS — E876 Hypokalemia: Secondary | ICD-10-CM | POA: Insufficient documentation

## 2022-06-18 LAB — BASIC METABOLIC PANEL
Anion gap: 7 (ref 5–15)
BUN: 14 mg/dL (ref 8–23)
CO2: 26 mmol/L (ref 22–32)
Calcium: 8.5 mg/dL — ABNORMAL LOW (ref 8.9–10.3)
Chloride: 106 mmol/L (ref 98–111)
Creatinine, Ser: 0.93 mg/dL (ref 0.61–1.24)
GFR, Estimated: >60 mL/min (ref >60)
Glucose, Bld: 122 mg/dL — ABNORMAL HIGH (ref 70–99)
Potassium: 3.4 mmol/L — ABNORMAL LOW (ref 3.5–5.1)
Sodium: 139 mmol/L (ref 135–145)

## 2022-06-18 LAB — GLUCOSE, CAPILLARY: Glucose-Capillary: 102 mg/dL — ABNORMAL HIGH (ref 70–99)

## 2022-06-18 LAB — CBC WITH DIFFERENTIAL/PLATELET
Abs Immature Granulocytes: 0.01 10*3/uL (ref 0.00–0.07)
Basophils Absolute: 0.1 10*3/uL (ref 0.0–0.1)
Basophils Relative: 1 %
Eosinophils Absolute: 0.2 10*3/uL (ref 0.0–0.5)
Eosinophils Relative: 4 %
HCT: 40.7 % (ref 39.0–52.0)
Hemoglobin: 13.3 g/dL (ref 13.0–17.0)
Immature Granulocytes: 0 %
Lymphocytes Relative: 58 %
Lymphs Abs: 3.2 10*3/uL (ref 0.7–4.0)
MCH: 29.2 pg (ref 26.0–34.0)
MCHC: 32.7 g/dL (ref 30.0–36.0)
MCV: 89.5 fL (ref 80.0–100.0)
Monocytes Absolute: 0.6 10*3/uL (ref 0.1–1.0)
Monocytes Relative: 10 %
Neutro Abs: 1.5 10*3/uL — ABNORMAL LOW (ref 1.7–7.7)
Neutrophils Relative %: 27 %
Platelets: 199 10*3/uL (ref 150–400)
RBC: 4.55 MIL/uL (ref 4.22–5.81)
RDW: 12.2 % (ref 11.5–15.5)
WBC: 5.5 10*3/uL (ref 4.0–10.5)
nRBC: 0 % (ref 0.0–0.2)

## 2022-06-18 MED ORDER — OXYCODONE HCL 5 MG PO TABS: 5.0000 mg | ORAL_TABLET | ORAL | Status: DC | PRN

## 2022-06-18 MED ORDER — AMOXICILLIN-POT CLAVULANATE 875-125 MG PO TABS: 1.0000 | ORAL_TABLET | Freq: Two times a day (BID) | ORAL | 0 refills | Status: AC

## 2022-06-18 MED ORDER — POTASSIUM CHLORIDE CRYS ER 20 MEQ PO TBCR
40.0000 meq | EXTENDED_RELEASE_TABLET | Freq: Once | ORAL | Status: AC
  Administered 2022-06-18: 40 meq via ORAL
  Filled 2022-06-18: qty 2

## 2022-06-18 NOTE — Discharge Summary (Signed)
Physician Discharge Summary   Patient: Ross Riley MRN: [id number] DOB: October 27, 1950  Admit date:     06/15/2022  Discharge date: 06/20/22  Discharge Physician: Pennie Banter   PCP: Smitty Cords, DO   Recommendations at discharge:   Follow up with Primary Care in 1-2 weeks Follow up on resolution of diverticulitis Repeat CBC, BMP in 1-2 weeks  Discharge Diagnoses: Principal Problem:   Diverticulitis Active Problems:   Type 2 diabetes mellitus with diabetic cataract, without long-term current use of insulin   Acid reflux   Hyperlipidemia associated with type 2 diabetes mellitus   Essential hypertension   Hypokalemia  Resolved Problems:   * No resolved hospital problems. [facility] Course:  From HPI of Dr. Alvester Morin : "Ross Riley is a 72 y.o. male with medical history significant of obesity, BPH, GERD, hypertension, hyperlipidemia presenting with diverticulitis.  Patient reports approximately 3 days of left-sided abdominal pain.  Symptoms have progressively worsening.  No reported nausea or diarrhea.  No reported chest pain or shortness of breath.  Reports remote history of diagnosis of diverticulosis in the past associated with colonoscopy roughly 10 years ago.  No alcohol or tobacco use.  Symptoms progressively worsened over the course of 3 days to the point of becoming unbearable.  No reported bloody bowel movements Presented to the ER afebrile, hemodynamically stable.  Satting well on room air.  White count 10.7, hemoglobin 15.5, creatinine 0.87, glucose 141.  CT abdomen pelvis with moderate to severe diverticulitis in the proximal sigmoid colon.  Otherwise stable findings."  Assessment and Plan:  Moderate to severe acute diverticulitis Presented with left-sided abdominal pain x 3 to 4 days with moderate to severe diverticulitis on CT imaging White count 10.7 on admission Treated with IV Zosyn, IV fluids, IV antiemetics, pain control  Diet has been  advanced and patient tolerating well with minimal pain.  No N/V.  4/17 -- medically stable to d/c on PO Augmentin   Essential hypertension BP stable Continue home regimen   Hyperlipidemia  On statin   Acid reflux On PPI   Type 2 diabetes mellitus with diabetic cataract, without long-term current use of insulin Covered with sliding scale insulin Resume home regimen at d/c. PCP follow up       Consultants: None Procedures performed: None  Disposition: Home Diet recommendation:  Soft Diet  DISCHARGE MEDICATION: Allergies as of 06/18/2022       Reactions   Metformin And Related Other (See Comments)   Headache and dizzines   Metformin Hcl Other (See Comments)   headache and dizziness        Medication List     STOP taking these medications    ezetimibe 10 MG tablet Commonly known as: ZETIA       TAKE these medications    Accu-Chek Aviva Plus w/Device Kit Use device to check blood sugar up to 1 time daily as directed   amoxicillin-clavulanate 875-125 MG tablet Commonly known as: AUGMENTIN Take 1 tablet by mouth 2 (two) times daily for 4 days.   atorvastatin 20 MG tablet Commonly known as: LIPITOR Take 1 tablet (20 mg total) by mouth at bedtime.   benazepril-hydrochlorthiazide 10-12.5 MG tablet Commonly known as: LOTENSIN HCT TAKE 1 TABLET BY MOUTH DAILY   dorzolamide-timolol 2-0.5 % ophthalmic solution Commonly known as: COSOPT Place 1 drop into both eyes 2 (two) times daily.   gabapentin 300 MG capsule Commonly known as: NEURONTIN Take 1 capsule (300 mg total) by  mouth 2 (two) times daily.   glipiZIDE 5 MG 24 hr tablet Commonly known as: GLUCOTROL XL TAKE 1 TABLET(5 MG) BY MOUTH DAILY WITH BREAKFAST   glucose blood test strip check blood sugar up to 1 time daily as directed   latanoprost 0.005 % ophthalmic solution Commonly known as: XALATAN INT 1 GTT INTO OU QHS   levothyroxine 75 MCG tablet Commonly known as: SYNTHROID Take 75 mcg  by mouth.   metoprolol tartrate 25 MG tablet Commonly known as: LOPRESSOR TAKE 1/2 TABLET(12.5 MG) BY MOUTH TWICE DAILY   omeprazole 40 MG capsule Commonly known as: PRILOSEC TAKE 1 CAPSULE(40 MG) BY MOUTH DAILY BEFORE BREAKFAST        Discharge Exam: Filed Weights   06/15/22 0608 06/15/22 1123  Weight: 76.2 kg 76.2 kg   General exam: awake, alert, no acute distress HEENT: atraumatic, clear conjunctiva, anicteric sclera, moist mucus membranes, hearing grossly normal  Respiratory system: CTAB, no wheezes, rales or rhonchi, normal respiratory effort. Cardiovascular system: normal S1/S2,  RRR, no JVD, murmurs, rubs, gallops, no pedal edema.   Gastrointestinal system: soft, NT, ND, no HSM felt, +bowel sounds. Central nervous system: A&O x 4. no gross focal neurologic deficits, normal speech Extremities: moves all , no edema, normal tone Skin: dry, intact, normal temperature, normal color, No rashes, lesions or ulcers Psychiatry: normal mood, congruent affect, judgement and insight appear normal   Condition at discharge: stable  The results of significant diagnostics from this hospitalization (including imaging, microbiology, ancillary and laboratory) are listed below for reference.   Imaging Studies: CT Abdomen Pelvis W Contrast  Result Date: 06/15/2022 CLINICAL DATA:  Acute left lower quadrant abdominal pain. EXAM: CT ABDOMEN AND PELVIS WITH CONTRAST TECHNIQUE: Multidetector CT imaging of the abdomen and pelvis was performed using the standard protocol following bolus administration of intravenous contrast. RADIATION DOSE REDUCTION: This exam was performed according to the departmental dose-optimization program which includes automated exposure control, adjustment of the mA and/or kV according to patient size and/or use of iterative reconstruction technique. CONTRAST:  OMNIPAQUE IOHEXOL 300 MG/ML  SOLN COMPARISON:  September 15, 2006. FINDINGS: Lower chest: No acute abnormality.  Hepatobiliary: No focal liver abnormality is seen. No gallstones, gallbladder wall thickening, or biliary dilatation. Pancreas: Unremarkable. No pancreatic ductal dilatation or surrounding inflammatory changes. Spleen: Normal in size without focal abnormality. Adrenals/Urinary Tract: Adrenal glands are unremarkable. Kidneys are normal, without renal calculi, focal lesion, or hydronephrosis. Bladder is unremarkable. Stomach/Bowel: Stomach and appendix are unremarkable. There is no evidence of bowel obstruction. Moderate to severe diverticulitis is seen involving the proximal sigmoid colon. No abscess is noted. Vascular/Lymphatic: Aortic atherosclerosis. No enlarged abdominal or pelvic lymph nodes. Reproductive: Stable mild prostatic enlargement. Other: Small fat containing left inguinal hernia. No ascites is noted. Musculoskeletal: No acute or significant osseous findings. IMPRESSION: Moderate to severe diverticulitis is seen involving the proximal sigmoid colon. No definite abscess is noted. Stable mild prostatic enlargement. Small fat containing left inguinal hernia. Aortic Atherosclerosis (ICD10-I70.0). Electronically Signed   By: Lupita Raider M.D.   On: 06/15/2022 09:29    Microbiology: Results for orders placed or performed in visit on 02/05/21  COVID-19, Flu A+B and RSV     Status: Abnormal   Collection Time: 02/05/21 12:00 AM  Result Value Ref Range Status   SARS-CoV-2, NAA Detected (A) Not Detected Final    Comment: Patients who have a positive COVID-19 test result may now have treatment options. Treatment options are available for patients with mild  to moderate symptoms and for hospitalized patients. Visit our website at [email or web address] for resources and information.    Influenza A, NAA Not Detected Not Detected Final   Influenza B, NAA Not Detected Not Detected Final   RSV, NAA Not Detected Not Detected Final   Test Information: Comment  Final    Comment: This nucleic  acid amplification test was developed and its performance characteristics determined by [employer]. Nucleic acid amplification tests include RT-PCR and TMA. This test has not been FDA cleared or approved. This test has been authorized by FDA under an Emergency Use Authorization (EUA). This test is only authorized for the duration of time the declaration that circumstances exist justifying the authorization of the emergency use of in vitro diagnostic tests for detection of SARS-CoV-2 virus and/or diagnosis of COVID-19 infection under section 564(b)(1) of the Act, 21 U.S.C. 409WJX-9(J) (1), unless the authorization is terminated or revoked sooner. When diagnostic testing is negative, the possibility of a false negative result should be considered in the context of a patient's recent exposures and the presence of clinical signs and symptoms consistent with COVID-19. An individual without symptoms of COVID-19 and who is not shedding SARS-CoV-2 virus wo uld expect to have a negative (not detected) result in this assay.     Labs: CBC: Recent Labs  Lab 06/15/22 0619 06/16/22 0555 06/17/22 0507 06/18/22 0600  WBC 10.7* 6.4 5.6 5.5  NEUTROABS  --   --  1.7 1.5*  HGB 15.5 13.2 12.8* 13.3  HCT 47.5 40.1 39.5 40.7  MCV 89.6 89.3 89.4 89.5  PLT 214 166 181 199   Basic Metabolic Panel: Recent Labs  Lab 06/15/22 0619 06/16/22 0555 06/16/22 1706 06/18/22 0600  NA 137 139 140 139  K 3.7 3.5 3.8 3.4*  CL 102 102 106 106  CO2 25 27 27 26   GLUCOSE 141* 153* 115* 122*  BUN 13 9 8 14   CREATININE 0.87 0.81 0.95 0.93  CALCIUM 9.0 8.4* 8.7* 8.5*   Liver Function Tests: Recent Labs  Lab 06/15/22 0619 06/16/22 0555  AST 21 23  ALT 18 14  ALKPHOS 62 44  BILITOT 1.1 1.5*  PROT 7.6 6.4*  ALBUMIN 4.1 3.4*   CBG: Recent Labs  Lab 06/17/22 0805 06/17/22 1218 06/17/22 1655 06/17/22 2117 06/18/22 0757  GLUCAP 104* 156* 162* 132* 102*    Discharge time spent: less than  30 minutes.  Signed: Pennie Banter, DO [facility] 06/20/2022

## 2022-06-20 ENCOUNTER — Telehealth: Payer: Self-pay | Admitting: *Deleted

## 2022-06-20 NOTE — Transitions of Care (Post Inpatient/ED Visit) (Signed)
   06/20/2022  Name: NICKOLI BAGHERI MRN: [id number] DOB: 1950/03/06  Today's TOC FU Call Status: Today's TOC FU Call Status:: Unsuccessul Call (1st Attempt) Unsuccessful Call (1st Attempt) Date: 06/20/22  Attempted to reach the patient regarding the most recent Inpatient/ED visit.  Follow Up Plan: Additional outreach attempts will be made to reach the patient to complete the Transitions of Care (Post Inpatient/ED visit) call.   Gean Maidens BSN RN [facility] [phone]

## 2022-06-20 NOTE — Transitions of Care (Post Inpatient/ED Visit) (Signed)
   06/20/2022  Name: Ross Riley MRN: [id number] DOB: 02-10-1951  Today's TOC FU Call Status: Today's TOC FU Call Status:: Unsuccessful Call (2nd Attempt) Unsuccessful Call (2nd Attempt) Date: 06/20/22  Attempted to reach the patient regarding the most recent Inpatient/ED visit.  Follow Up Plan: Additional outreach attempts will be made to reach the patient to complete the Transitions of Care (Post Inpatient/ED visit) call.   Gean Maidens BSN RN [facility] [phone]

## 2022-06-23 ENCOUNTER — Encounter: Payer: Self-pay | Admitting: Family Medicine

## 2022-06-23 ENCOUNTER — Telehealth: Payer: Self-pay | Admitting: *Deleted

## 2022-06-23 ENCOUNTER — Ambulatory Visit (INDEPENDENT_AMBULATORY_CARE_PROVIDER_SITE_OTHER): Payer: 59 | Admitting: Family Medicine

## 2022-06-23 VITALS — BP 123/71 | HR 60 | Ht 66.0 in | Wt 169.0 lb

## 2022-06-23 DIAGNOSIS — Z4789 Encounter for other orthopedic aftercare: Secondary | ICD-10-CM | POA: Diagnosis not present

## 2022-06-23 DIAGNOSIS — M25572 Pain in left ankle and joints of left foot: Secondary | ICD-10-CM | POA: Diagnosis not present

## 2022-06-23 DIAGNOSIS — Z8719 Personal history of other diseases of the digestive system: Secondary | ICD-10-CM

## 2022-06-23 DIAGNOSIS — K5792 Diverticulitis of intestine, part unspecified, without perforation or abscess without bleeding: Secondary | ICD-10-CM | POA: Diagnosis not present

## 2022-06-23 DIAGNOSIS — R1032 Left lower quadrant pain: Secondary | ICD-10-CM | POA: Diagnosis not present

## 2022-06-23 DIAGNOSIS — R262 Difficulty in walking, not elsewhere classified: Secondary | ICD-10-CM | POA: Diagnosis not present

## 2022-06-23 MED ORDER — DICYCLOMINE HCL 10 MG PO CAPS
10.0000 mg | ORAL_CAPSULE | Freq: Three times a day (TID) | ORAL | 2 refills | Status: DC
Start: 2022-06-23 — End: 2023-01-12

## 2022-06-23 NOTE — Progress Notes (Unsigned)
Subjective:    Patient ID: Ross Riley, male    DOB: May 12, 1950, 72 y.o.   MRN: [id number]  Ross Riley is a 72 y.o. male presenting on 06/23/2022 for Medical Management of Chronic Issues and Hospitalization Follow-up  Spanish Language Language Interpreter on video.  HPI  HOSPITAL FOLLOW-UP VISIT  Hospital/Location: [facility] Date of Admission: 06/15/22 Date of Discharge: 06/20/22 Transitions of care telephone call: Gean Maidens RN - completed. 06/20/22  Reason for Admission: Diverticulitis Abdominal Pain  - Hospital H&P and Discharge Summary have been reviewed - Patient presents today 3 days after recent hospitalization. Brief summary of recent course, patient had symptoms of abdominal pain LLQ without diarrhea or blood. Treated for diverticulitis after confirmed with moderate to severe on CT imaging. Given IV medication. Discharged on oral antibiotics.  - Today reports overall has done well after discharge. Symptoms of LLQ pain have improved.  ***Prior Bentyl  - New medications on discharge: Augmentin course - Changes to current meds on discharge: none  I have reviewed the discharge medication list, and have reconciled the current and discharge medications today.   Current Outpatient Medications:    atorvastatin (LIPITOR) 20 MG tablet, Take 1 tablet (20 mg total) by mouth at bedtime., Disp: 90 tablet, Rfl: 3   benazepril-hydrochlorthiazide (LOTENSIN HCT) 10-12.5 MG tablet, TAKE 1 TABLET BY MOUTH DAILY, Disp: 90 tablet, Rfl: 0   Blood Glucose Monitoring Suppl (ACCU-CHEK AVIVA PLUS) w/Device KIT, Use device to check blood sugar up to 1 time daily as directed, Disp: 1 kit, Rfl: 0   dorzolamide-timolol (COSOPT) 22.3-6.8 MG/ML ophthalmic solution, Place 1 drop into both eyes 2 (two) times daily., Disp: , Rfl:    ezetimibe (ZETIA) 10 MG tablet, Take 10 mg by mouth daily., Disp: , Rfl:    gabapentin (NEURONTIN) 300 MG capsule, Take 1 capsule (300 mg total) by mouth 2 (two)  times daily., Disp: 180 capsule, Rfl: 3   glipiZIDE (GLUCOTROL XL) 5 MG 24 hr tablet, TAKE 1 TABLET(5 MG) BY MOUTH DAILY WITH BREAKFAST, Disp: 90 tablet, Rfl: 1   glucose blood test strip, check blood sugar up to 1 time daily as directed, Disp: 100 each, Rfl: 3   latanoprost (XALATAN) 0.005 % ophthalmic solution, INT 1 GTT INTO OU QHS, Disp: , Rfl: 6   levothyroxine (SYNTHROID) 75 MCG tablet, Take 75 mcg by mouth., Disp: , Rfl:    metoprolol tartrate (LOPRESSOR) 25 MG tablet, TAKE 1/2 TABLET(12.5 MG) BY MOUTH TWICE DAILY, Disp: 90 tablet, Rfl: 3   omeprazole (PRILOSEC) 40 MG capsule, TAKE 1 CAPSULE(40 MG) BY MOUTH DAILY BEFORE BREAKFAST, Disp: 90 capsule, Rfl: 1  ------------------------------------------------------------------------- Social History   Tobacco Use   Smoking status: Former    Packs/day: 1.00    Years: 32.00    Additional pack years: 0.00    Total pack years: 32.00    Types: Cigarettes    Quit date: 03/04/1991    Years since quitting: 31.3   Smokeless tobacco: Former  Building services engineer Use: Never used  Substance Use Topics   Alcohol use: Not Currently    Alcohol/week: 1.0 standard drink of alcohol    Types: 1 Shots of liquor per week   Drug use: No    Review of Systems Per HPI unless specifically indicated above     Objective:    BP 123/71   Pulse 60   Ht 5\' 6"  (1.676 m)   Wt 169 lb (76.7 kg)   BMI 27.28 kg/m  Wt Readings from Last 3 Encounters:  06/23/22 169 lb (76.7 kg)  06/15/22 167 lb 15.9 oz (76.2 kg)  04/25/22 157 lb 3.2 oz (71.3 kg)    Physical Exam*** Results for orders placed or performed during the hospital encounter of 06/15/22  Lipase, blood  Result Value Ref Range   Lipase 45 11 - 51 U/L  Comprehensive metabolic panel  Result Value Ref Range   Sodium 137 135 - 145 mmol/L   Potassium 3.7 3.5 - 5.1 mmol/L   Chloride 102 98 - 111 mmol/L   CO2 25 22 - 32 mmol/L   Glucose, Bld 141 (H) 70 - 99 mg/dL   BUN 13 8 - 23 mg/dL   Creatinine,  Ser 7.82 0.61 - 1.24 mg/dL   Calcium 9.0 8.9 - 95.6 mg/dL   Total Protein 7.6 6.5 - 8.1 g/dL   Albumin 4.1 3.5 - 5.0 g/dL   AST 21 15 - 41 U/L   ALT 18 0 - 44 U/L   Alkaline Phosphatase 62 38 - 126 U/L   Total Bilirubin 1.1 0.3 - 1.2 mg/dL   GFR, Estimated >21 >30 mL/min   Anion gap 10 5 - 15  CBC  Result Value Ref Range   WBC 10.7 (H) 4.0 - 10.5 K/uL   RBC 5.30 4.22 - 5.81 MIL/uL   Hemoglobin 15.5 13.0 - 17.0 g/dL   HCT 86.5 78.4 - 69.6 %   MCV 89.6 80.0 - 100.0 fL   MCH 29.2 26.0 - 34.0 pg   MCHC 32.6 30.0 - 36.0 g/dL   RDW 29.5 28.4 - 13.2 %   Platelets 214 150 - 400 K/uL   nRBC 0.0 0.0 - 0.2 %  Urinalysis, Routine w reflex microscopic -Urine, Clean Catch  Result Value Ref Range   Color, Urine YELLOW (A) YELLOW   APPearance HAZY (A) CLEAR   Specific Gravity, Urine 1.021 1.005 - 1.030   pH 5.0 5.0 - 8.0   Glucose, UA NEGATIVE NEGATIVE mg/dL   Hgb urine dipstick LARGE (A) NEGATIVE   Bilirubin Urine NEGATIVE NEGATIVE   Ketones, ur NEGATIVE NEGATIVE mg/dL   Protein, ur 30 (A) NEGATIVE mg/dL   Nitrite NEGATIVE NEGATIVE   Leukocytes,Ua NEGATIVE NEGATIVE   RBC / HPF >50 0 - 5 RBC/hpf   WBC, UA 0-5 0 - 5 WBC/hpf   Bacteria, UA RARE (A) NONE SEEN   Squamous Epithelial / HPF 0-5 0 - 5 /HPF   Mucus PRESENT   Glucose, capillary  Result Value Ref Range   Glucose-Capillary 97 70 - 99 mg/dL  Glucose, capillary  Result Value Ref Range   Glucose-Capillary 150 (H) 70 - 99 mg/dL  CBC  Result Value Ref Range   WBC 6.4 4.0 - 10.5 K/uL   RBC 4.49 4.22 - 5.81 MIL/uL   Hemoglobin 13.2 13.0 - 17.0 g/dL   HCT 44.0 10.2 - 72.5 %   MCV 89.3 80.0 - 100.0 fL   MCH 29.4 26.0 - 34.0 pg   MCHC 32.9 30.0 - 36.0 g/dL   RDW 36.6 44.0 - 34.7 %   Platelets 166 150 - 400 K/uL   nRBC 0.3 (H) 0.0 - 0.2 %  Comprehensive metabolic panel  Result Value Ref Range   Sodium 139 135 - 145 mmol/L   Potassium 3.5 3.5 - 5.1 mmol/L   Chloride 102 98 - 111 mmol/L   CO2 27 22 - 32 mmol/L   Glucose, Bld  153 (H) 70 - 99 mg/dL   BUN  9 8 - 23 mg/dL   Creatinine, Ser 1.61 0.61 - 1.24 mg/dL   Calcium 8.4 (L) 8.9 - 10.3 mg/dL   Total Protein 6.4 (L) 6.5 - 8.1 g/dL   Albumin 3.4 (L) 3.5 - 5.0 g/dL   AST 23 15 - 41 U/L   ALT 14 0 - 44 U/L   Alkaline Phosphatase 44 38 - 126 U/L   Total Bilirubin 1.5 (H) 0.3 - 1.2 mg/dL   GFR, Estimated >09 >60 mL/min   Anion gap 10 5 - 15  Glucose, capillary  Result Value Ref Range   Glucose-Capillary 181 (H) 70 - 99 mg/dL  Glucose, capillary  Result Value Ref Range   Glucose-Capillary 170 (H) 70 - 99 mg/dL  Glucose, capillary  Result Value Ref Range   Glucose-Capillary 82 70 - 99 mg/dL  Glucose, capillary  Result Value Ref Range   Glucose-Capillary 109 (H) 70 - 99 mg/dL  Glucose, capillary  Result Value Ref Range   Glucose-Capillary 182 (H) 70 - 99 mg/dL  Basic metabolic panel  Result Value Ref Range   Sodium 140 135 - 145 mmol/L   Potassium 3.8 3.5 - 5.1 mmol/L   Chloride 106 98 - 111 mmol/L   CO2 27 22 - 32 mmol/L   Glucose, Bld 115 (H) 70 - 99 mg/dL   BUN 8 8 - 23 mg/dL   Creatinine, Ser 4.54 0.61 - 1.24 mg/dL   Calcium 8.7 (L) 8.9 - 10.3 mg/dL   GFR, Estimated >09 >81 mL/min   Anion gap 7 5 - 15  Glucose, capillary  Result Value Ref Range   Glucose-Capillary 116 (H) 70 - 99 mg/dL  CBC with Differential/Platelet  Result Value Ref Range   WBC 5.6 4.0 - 10.5 K/uL   RBC 4.42 4.22 - 5.81 MIL/uL   Hemoglobin 12.8 (L) 13.0 - 17.0 g/dL   HCT 19.1 47.8 - 29.5 %   MCV 89.4 80.0 - 100.0 fL   MCH 29.0 26.0 - 34.0 pg   MCHC 32.4 30.0 - 36.0 g/dL   RDW 62.1 30.8 - 65.7 %   Platelets 181 150 - 400 K/uL   nRBC 0.0 0.0 - 0.2 %   Neutrophils Relative % 31 %   Neutro Abs 1.7 1.7 - 7.7 K/uL   Lymphocytes Relative 54 %   Lymphs Abs 3.0 0.7 - 4.0 K/uL   Monocytes Relative 10 %   Monocytes Absolute 0.6 0.1 - 1.0 K/uL   Eosinophils Relative 4 %   Eosinophils Absolute 0.2 0.0 - 0.5 K/uL   Basophils Relative 1 %   Basophils Absolute 0.0 0.0 - 0.1  K/uL   Immature Granulocytes 0 %   Abs Immature Granulocytes 0.02 0.00 - 0.07 K/uL  Glucose, capillary  Result Value Ref Range   Glucose-Capillary 134 (H) 70 - 99 mg/dL  Glucose, capillary  Result Value Ref Range   Glucose-Capillary 104 (H) 70 - 99 mg/dL  Glucose, capillary  Result Value Ref Range   Glucose-Capillary 156 (H) 70 - 99 mg/dL  Glucose, capillary  Result Value Ref Range   Glucose-Capillary 162 (H) 70 - 99 mg/dL  CBC with Differential/Platelet  Result Value Ref Range   WBC 5.5 4.0 - 10.5 K/uL   RBC 4.55 4.22 - 5.81 MIL/uL   Hemoglobin 13.3 13.0 - 17.0 g/dL   HCT 84.6 96.2 - 95.2 %   MCV 89.5 80.0 - 100.0 fL   MCH 29.2 26.0 - 34.0 pg   MCHC 32.7 30.0 - 36.0  g/dL   RDW 16.1 09.6 - 04.5 %   Platelets 199 150 - 400 K/uL   nRBC 0.0 0.0 - 0.2 %   Neutrophils Relative % 27 %   Neutro Abs 1.5 (L) 1.7 - 7.7 K/uL   Lymphocytes Relative 58 %   Lymphs Abs 3.2 0.7 - 4.0 K/uL   Monocytes Relative 10 %   Monocytes Absolute 0.6 0.1 - 1.0 K/uL   Eosinophils Relative 4 %   Eosinophils Absolute 0.2 0.0 - 0.5 K/uL   Basophils Relative 1 %   Basophils Absolute 0.1 0.0 - 0.1 K/uL   Immature Granulocytes 0 %   Abs Immature Granulocytes 0.01 0.00 - 0.07 K/uL  Basic metabolic panel  Result Value Ref Range   Sodium 139 135 - 145 mmol/L   Potassium 3.4 (L) 3.5 - 5.1 mmol/L   Chloride 106 98 - 111 mmol/L   CO2 26 22 - 32 mmol/L   Glucose, Bld 122 (H) 70 - 99 mg/dL   BUN 14 8 - 23 mg/dL   Creatinine, Ser 4.09 0.61 - 1.24 mg/dL   Calcium 8.5 (L) 8.9 - 10.3 mg/dL   GFR, Estimated >81 >19 mL/min   Anion gap 7 5 - 15  Glucose, capillary  Result Value Ref Range   Glucose-Capillary 132 (H) 70 - 99 mg/dL  Glucose, capillary  Result Value Ref Range   Glucose-Capillary 102 (H) 70 - 99 mg/dL      Assessment & Plan:   Problem List Items Addressed This Visit     Diverticulitis   History of diverticulitis   Other Visit Diagnoses     Colicky LLQ abdominal pain    -  Primary         No orders of the defined types were placed in this encounter.   Follow up plan: No follow-ups on file.   Saralyn Pilar, DO [facility] 06/23/2022, 4:48 PM

## 2022-06-23 NOTE — Transitions of Care (Post Inpatient/ED Visit) (Signed)
   06/23/2022  Name: BRALLAN DENIO MRN: [id number] DOB: [birth year]  Today's TOC FU Call Status: Today's TOC FU Call Status:: Unsuccessful Call (3rd Attempt) Unsuccessful Call (3rd Attempt) Date: 06/23/22  Attempted to reach the patient regarding the most recent Inpatient/ED visit.  Follow Up Plan: No further outreach attempts will be made at this time. We have been unable to contact the patient.  Gean Maidens BSN RN [facility] [phone]

## 2022-06-23 NOTE — Patient Instructions (Addendum)
Thank you for coming to the office today.  Diverticulitis was treated with the antibiotics previously.  Dicyclomine as needed for abdominal cramping pain. Use if you need it especially with meals and evening.  Okay to proceed w/ Colonoscopy.  Keep apt in August  Please schedule a Follow-up Appointment to: Return if symptoms worsen or fail to improve, for keep apt upcoming.  If you have any other questions or concerns, please feel free to call the office or send a message through MyChart. You may also schedule an earlier appointment if necessary.  Additionally, you may be receiving a survey about your experience at our office within a few days to 1 week by e-mail or mail. We value your feedback.  Saralyn Pilar, DO [facility], [place]

## 2022-06-24 ENCOUNTER — Encounter: Payer: Self-pay | Admitting: Family Medicine

## 2022-06-24 ENCOUNTER — Other Ambulatory Visit: Payer: Self-pay | Admitting: Family Medicine

## 2022-06-24 DIAGNOSIS — E1136 Type 2 diabetes mellitus with diabetic cataract: Secondary | ICD-10-CM

## 2022-06-24 NOTE — Telephone Encounter (Signed)
Requested Prescriptions  Pending Prescriptions Disp Refills   glipiZIDE (GLUCOTROL XL) 5 MG 24 hr tablet [Pharmacy Med Name: GLIPIZIDE ER  TABLETS] 90 tablet 1    Sig: TAKE 1 TABLET(5 MG) BY MOUTH DAILY WITH BREAKFAST     Endocrinology:  Diabetes - Sulfonylureas Passed - 06/24/2022 10:02 AM      Passed - HBA1C is between 0 and 7.9 and within 180 days    Hemoglobin A1C  Date Value Ref Range Status  04/02/2022 7.1  Final         Passed - Cr in normal range and within 360 days    Creat  Date Value Ref Range Status  11/07/2021 0.99 0.70 - 1.28 mg/dL Final   Creatinine, Ser  Date Value Ref Range Status  06/18/2022 0.93 0.61 - 1.24 mg/dL Final   Creatinine, POC  Date Value Ref Range Status  06/18/2015 0 mg/dL Final         Passed - Valid encounter within last 6 months    Recent Outpatient Visits           Yesterday Colicky LLQ abdominal pain   [place] [facility] Smitty Cords, DO   2 months ago Type 2 diabetes mellitus with diabetic cataract, without long-term current use of insulin [facility])   [place] [facility] [place], Netta Neat, DO   4 months ago Viral URI with cough   [place] [facility] Smitty Cords, DO   6 months ago Viral URI with cough   [place] [facility] [place], Salvadore Oxford, NP   7 months ago LUQ pain   [place] [facility] [place], Salvadore Oxford, NP       Future Appointments             In 4 months Althea Charon, Netta Neat, DO  [facility], [facility]

## 2022-06-25 DIAGNOSIS — M25572 Pain in left ankle and joints of left foot: Secondary | ICD-10-CM | POA: Diagnosis not present

## 2022-06-25 DIAGNOSIS — Z4789 Encounter for other orthopedic aftercare: Secondary | ICD-10-CM | POA: Diagnosis not present

## 2022-06-25 DIAGNOSIS — R262 Difficulty in walking, not elsewhere classified: Secondary | ICD-10-CM | POA: Diagnosis not present

## 2022-06-27 DIAGNOSIS — K5730 Diverticulosis of large intestine without perforation or abscess without bleeding: Secondary | ICD-10-CM | POA: Diagnosis not present

## 2022-06-27 DIAGNOSIS — Z87891 Personal history of nicotine dependence: Secondary | ICD-10-CM | POA: Diagnosis not present

## 2022-06-27 DIAGNOSIS — K648 Other hemorrhoids: Secondary | ICD-10-CM | POA: Diagnosis not present

## 2022-06-27 DIAGNOSIS — Z86010 Personal history of colonic polyps: Secondary | ICD-10-CM | POA: Diagnosis not present

## 2022-06-27 DIAGNOSIS — I10 Essential (primary) hypertension: Secondary | ICD-10-CM | POA: Diagnosis not present

## 2022-06-27 DIAGNOSIS — K219 Gastro-esophageal reflux disease without esophagitis: Secondary | ICD-10-CM | POA: Diagnosis not present

## 2022-06-27 DIAGNOSIS — D125 Benign neoplasm of sigmoid colon: Secondary | ICD-10-CM | POA: Diagnosis not present

## 2022-06-27 DIAGNOSIS — E039 Hypothyroidism, unspecified: Secondary | ICD-10-CM | POA: Diagnosis not present

## 2022-06-27 DIAGNOSIS — Z1211 Encounter for screening for malignant neoplasm of colon: Secondary | ICD-10-CM | POA: Diagnosis not present

## 2022-06-27 DIAGNOSIS — E785 Hyperlipidemia, unspecified: Secondary | ICD-10-CM | POA: Diagnosis not present

## 2022-06-27 DIAGNOSIS — Z79899 Other long term (current) drug therapy: Secondary | ICD-10-CM | POA: Diagnosis not present

## 2022-06-27 DIAGNOSIS — I2510 Atherosclerotic heart disease of native coronary artery without angina pectoris: Secondary | ICD-10-CM | POA: Diagnosis not present

## 2022-06-27 DIAGNOSIS — E119 Type 2 diabetes mellitus without complications: Secondary | ICD-10-CM | POA: Diagnosis not present

## 2022-06-27 DIAGNOSIS — Z9889 Other specified postprocedural states: Secondary | ICD-10-CM | POA: Diagnosis not present

## 2022-06-30 DIAGNOSIS — M25572 Pain in left ankle and joints of left foot: Secondary | ICD-10-CM | POA: Diagnosis not present

## 2022-06-30 DIAGNOSIS — R262 Difficulty in walking, not elsewhere classified: Secondary | ICD-10-CM | POA: Diagnosis not present

## 2022-06-30 DIAGNOSIS — Z4789 Encounter for other orthopedic aftercare: Secondary | ICD-10-CM | POA: Diagnosis not present

## 2022-07-03 DIAGNOSIS — R262 Difficulty in walking, not elsewhere classified: Secondary | ICD-10-CM | POA: Diagnosis not present

## 2022-07-03 DIAGNOSIS — Z4789 Encounter for other orthopedic aftercare: Secondary | ICD-10-CM | POA: Diagnosis not present

## 2022-07-03 DIAGNOSIS — M25572 Pain in left ankle and joints of left foot: Secondary | ICD-10-CM | POA: Diagnosis not present

## 2022-07-07 DIAGNOSIS — Z96662 Presence of left artificial ankle joint: Secondary | ICD-10-CM | POA: Diagnosis not present

## 2022-07-07 DIAGNOSIS — M19072 Primary osteoarthritis, left ankle and foot: Secondary | ICD-10-CM | POA: Diagnosis not present

## 2022-07-07 DIAGNOSIS — M216X2 Other acquired deformities of left foot: Secondary | ICD-10-CM | POA: Diagnosis not present

## 2022-07-07 DIAGNOSIS — M24572 Contracture, left ankle: Secondary | ICD-10-CM | POA: Diagnosis not present

## 2022-07-09 ENCOUNTER — Other Ambulatory Visit: Payer: Self-pay | Admitting: Cardiovascular Disease

## 2022-07-14 ENCOUNTER — Telehealth: Payer: Self-pay | Admitting: Family Medicine

## 2022-07-14 NOTE — Telephone Encounter (Signed)
Called patient to schedule Medicare Annual Wellness Visit (AWV). Left message for patient to call back and schedule Medicare Annual Wellness Visit (AWV).  Last date of AWV: 01/01/21  Please schedule an appointment at any time with Kennedy Bucker, LPN  .  If any questions, please contact me.  Thank you ,  [name]; Care Guide Ambulatory Clinical Support [place] [facility] Direct Dial: [phone]

## 2022-07-16 ENCOUNTER — Other Ambulatory Visit: Payer: Self-pay | Admitting: Family Medicine

## 2022-07-16 DIAGNOSIS — I10 Essential (primary) hypertension: Secondary | ICD-10-CM

## 2022-07-16 NOTE — Telephone Encounter (Signed)
Requested Prescriptions  Pending Prescriptions Disp Refills   benazepril-hydrochlorthiazide (LOTENSIN HCT) 10-12.5 MG tablet [Pharmacy Med Name: BENAZEPRIL/HCTZ 10/12.5MG  TABLETS] 90 tablet 0    Sig: TAKE 1 TABLET BY MOUTH DAILY     Cardiovascular:  ACEI + Diuretic Combos Failed - 07/16/2022  1:10 PM      Failed - K in normal range and within 180 days    Potassium  Date Value Ref Range Status  06/18/2022 3.4 (L) 3.5 - 5.1 mmol/L Final  12/07/2012 3.3 (L) 3.5 - 5.1 mmol/L Final         Passed - Na in normal range and within 180 days    Sodium  Date Value Ref Range Status  06/18/2022 139 135 - 145 mmol/L Final  06/19/2015 142 134 - 144 mmol/L Final  12/07/2012 139 136 - 145 mmol/L Final         Passed - Cr in normal range and within 180 days    Creat  Date Value Ref Range Status  11/07/2021 0.99 0.70 - 1.28 mg/dL Final   Creatinine, Ser  Date Value Ref Range Status  06/18/2022 0.93 0.61 - 1.24 mg/dL Final   Creatinine, POC  Date Value Ref Range Status  06/18/2015 0 mg/dL Final         Passed - eGFR is 30 or above and within 180 days    GFR, Est African American  Date Value Ref Range Status  11/09/2019 83 > OR = 60 mL/min/1.37m2 Final   GFR, Est Non African American  Date Value Ref Range Status  11/09/2019 72 > OR = 60 mL/min/1.42m2 Final   GFR, Estimated  Date Value Ref Range Status  06/18/2022 >60 >60 mL/min Final    Comment:    (NOTE) Calculated using the CKD-EPI Creatinine Equation (2021)    eGFR  Date Value Ref Range Status  11/07/2021 82 > OR = 60 mL/min/1.23m2 Final         Passed - Patient is not pregnant      Passed - Last BP in normal range    BP Readings from Last 1 Encounters:  06/23/22 123/71         Passed - Valid encounter within last 6 months    Recent Outpatient Visits           3 weeks ago Colicky LLQ abdominal pain   [place] [facility] Smitty Cords, DO   2 months ago Type 2 diabetes mellitus with  diabetic cataract, without long-term current use of insulin [facility])   [place] [facility] Smitty Cords, DO   5 months ago Viral URI with cough   [place] [facility] Smitty Cords, DO   7 months ago Viral URI with cough   [place] [facility] [place], Salvadore Oxford, NP   8 months ago LUQ pain   [place] [facility] [place], Salvadore Oxford, NP       Future Appointments             In 3 months Althea Charon, Netta Neat, DO [facility], [facility]

## 2022-07-17 ENCOUNTER — Telehealth: Payer: Self-pay | Admitting: Family Medicine

## 2022-07-17 NOTE — Telephone Encounter (Signed)
Contacted [name] to schedule their annual wellness visit. Appointment made for 0606/2024.   [name]; Care Guide Ambulatory Clinical Support [place] [facility] Direct Dial: [phone]

## 2022-07-21 ENCOUNTER — Other Ambulatory Visit: Payer: Self-pay | Admitting: Family Medicine

## 2022-07-21 DIAGNOSIS — K219 Gastro-esophageal reflux disease without esophagitis: Secondary | ICD-10-CM

## 2022-07-22 NOTE — Telephone Encounter (Signed)
Requested Prescriptions  Pending Prescriptions Disp Refills   omeprazole (PRILOSEC) 40 MG capsule [Pharmacy Med Name: OMEPRAZOLE 40MG  CAPSULES] 90 capsule 1    Sig: TAKE 1 CAPSULE(40 MG) BY MOUTH DAILY BEFORE BREAKFAST     Gastroenterology: Proton Pump Inhibitors Passed - 07/21/2022  5:04 PM      Passed - Valid encounter within last 12 months    Recent Outpatient Visits           4 weeks ago Colicky LLQ abdominal pain   [place] [facility] [place], Netta Neat, DO   2 months ago Type 2 diabetes mellitus with diabetic cataract, without long-term current use of insulin [facility])   [place] [facility] [place], Netta Neat, DO   5 months ago Viral URI with cough   [place] [facility] Smitty Cords, DO   7 months ago Viral URI with cough   [place] [facility] [place], Salvadore Oxford, NP   8 months ago LUQ pain   [place] [facility] [place], Salvadore Oxford, NP       Future Appointments             In 3 months Althea Charon, Netta Neat, DO [place] [facility], [facility]

## 2022-08-07 ENCOUNTER — Ambulatory Visit (INDEPENDENT_AMBULATORY_CARE_PROVIDER_SITE_OTHER): Payer: 59

## 2022-08-07 VITALS — Ht 66.0 in | Wt 169.0 lb

## 2022-08-07 DIAGNOSIS — Z0000 Encounter for general adult medical examination without abnormal findings: Secondary | ICD-10-CM | POA: Diagnosis not present

## 2022-08-07 NOTE — Progress Notes (Signed)
I connected with  Ross Riley on 08/07/22 by a audio enabled telemedicine application and verified that I am speaking with the correct person using two identifiers.  Patient Location: Home  Provider Location: Office/Clinic  I discussed the limitations of evaluation and management by telemedicine. The patient expressed understanding and agreed to proceed.  Subjective:   Ross Riley is a 72 y.o. male who presents for Medicare Annual/Subsequent preventive examination.  Review of Systems     Cardiac Risk Factors include: advanced age (>38men, >56 women);diabetes mellitus;dyslipidemia;hypertension;male gender     Objective:    Today's Vitals   08/07/22 1445  Weight: 169 lb (76.7 kg)  Height: 5\' 6"  (1.676 m)   Body mass index is 27.28 kg/m.     08/07/2022    2:34 PM 06/15/2022   11:26 AM 06/15/2022    6:09 AM 07/13/2021    3:03 PM 01/01/2021    1:36 PM 11/11/2020   10:15 AM 05/24/2019    1:46 PM  Advanced Directives  Does Patient Have a Medical Advance Directive? No  No No No No No  Would patient like information on creating a medical advance directive? No - Patient declined No - Patient declined  No - Patient declined       Current Medications (verified) Outpatient Encounter Medications as of 08/07/2022  Medication Sig   atorvastatin (LIPITOR) 20 MG tablet Take 1 tablet (20 mg total) by mouth at bedtime.   benazepril-hydrochlorthiazide (LOTENSIN HCT) 10-12.5 MG tablet TAKE 1 TABLET BY MOUTH DAILY   Blood Glucose Monitoring Suppl (ACCU-CHEK AVIVA PLUS) w/Device KIT Use device to check blood sugar up to 1 time daily as directed   dicyclomine (BENTYL) 10 MG capsule Take 1 capsule (10 mg total) by mouth 4 (four) times daily -  before meals and at bedtime. As needed for abdominal pain cramping   dorzolamide-timolol (COSOPT) 22.3-6.8 MG/ML ophthalmic solution Place 1 drop into both eyes 2 (two) times daily.   ezetimibe (ZETIA) 10 MG tablet Take 10 mg by mouth daily.    gabapentin (NEURONTIN) 300 MG capsule Take 1 capsule (300 mg total) by mouth 2 (two) times daily.   glipiZIDE (GLUCOTROL XL) 5 MG 24 hr tablet TAKE 1 TABLET(5 MG) BY MOUTH DAILY WITH BREAKFAST   glucose blood test strip check blood sugar up to 1 time daily as directed   latanoprost (XALATAN) 0.005 % ophthalmic solution INT 1 GTT INTO OU QHS   levothyroxine (SYNTHROID) 75 MCG tablet Take 75 mcg by mouth.   metoprolol tartrate (LOPRESSOR) 25 MG tablet TAKE 1/2 TABLET(12.5 MG) BY MOUTH TWICE DAILY   omeprazole (PRILOSEC) 40 MG capsule TAKE 1 CAPSULE(40 MG) BY MOUTH DAILY BEFORE BREAKFAST   No facility-administered encounter medications on file as of 08/07/2022.    Allergies (verified) Metformin and related and Metformin hcl   History: Past Medical History:  Diagnosis Date   Arthritis    BPH (benign prostatic hyperplasia)    Chronic kidney disease    kidney stones   Dystonia    ED (erectile dysfunction)    GERD (gastroesophageal reflux disease)    Glaucoma (increased eye pressure)    Hematuria    Hemorrhoids    Hyperlipidemia    Hypertension    Thyroid disease    Past Surgical History:  Procedure Laterality Date   CATARACT EXTRACTION Bilateral    EXTRACORPOREAL SHOCK WAVE LITHOTRIPSY Right    JOINT REPLACEMENT Left 08/2016   left knee, [facility]   KIDNEY STONE SURGERY  2009  PROSTATE SURGERY  2011   TRANSURETHRAL RESECTION OF PROSTATE N/A 12/06/2014   Procedure: TRANSURETHRAL RESECTION OF THE PROSTATE (TURP);  Surgeon: Hildred Laser, MD;  Location: [facility] ORS;  Service: Urology;  Laterality: N/A;   Family History  Problem Relation Age of Onset   Heart disease Mother    Social History   Socioeconomic History   Marital status: Married    Spouse name: Not on file   Number of children: Not on file   Years of education: Not on file   Highest education level: 9th grade  Occupational History   Occupation: retired  Tobacco Use   Smoking status: Former    Packs/day: 1.00     Years: 32.00    Additional pack years: 0.00    Total pack years: 32.00    Types: Cigarettes    Quit date: 03/04/1991    Years since quitting: 31.4   Smokeless tobacco: Former  Building services engineer Use: Never used  Substance and Sexual Activity   Alcohol use: Not Currently    Alcohol/week: 1.0 standard drink of alcohol    Types: 1 Shots of liquor per week   Drug use: No   Sexual activity: Yes    Birth control/protection: None  Other Topics Concern   Not on file  Social History Narrative   Not on file   Social Determinants of Health   Financial Resource Strain: Low Risk  (08/07/2022)   Overall Financial Resource Strain (CARDIA)    Difficulty of Paying Living Expenses: Not very hard  Food Insecurity: No Food Insecurity (08/07/2022)   Hunger Vital Sign    Worried About Running Out of Food in the Last Year: Never true    Ran Out of Food in the Last Year: Never true  Transportation Needs: No Transportation Needs (08/07/2022)   PRAPARE - Administrator, Civil Service (Medical): No    Lack of Transportation (Non-Medical): No  Physical Activity: Sufficiently Active (08/07/2022)   Exercise Vital Sign    Days of Exercise per Week: 3 days    Minutes of Exercise per Session: 60 min  Stress: No Stress Concern Present (08/07/2022)   [employer] of Occupational Health - Occupational Stress Questionnaire    Feeling of Stress : Not at all  Social Connections: Socially Isolated (08/07/2022)   Social Connection and Isolation Panel [NHANES]    Frequency of Communication with Friends and Family: Never    Frequency of Social Gatherings with Friends and Family: Never    Attends Religious Services: Never    Database administrator or Organizations: No    Attends Engineer, structural: Never    Marital Status: Married    Tobacco Counseling Counseling given: Not Answered   Clinical Intake:  Pre-visit preparation completed: Yes  Pain : No/denies pain     Nutritional  Risks: None Diabetes: Yes CBG done?: No Did pt. bring in CBG monitor from home?: No  How often do you need to have someone help you when you read instructions, pamphlets, or other written materials from your doctor or pharmacy?: 1 - Never  Diabetic?yes Nutrition Risk Assessment:  Has the patient had any N/V/D within the last 2 months?  No  Does the patient have any non-healing wounds?  No  Has the patient had any unintentional weight loss or weight gain?  No   Diabetes:  Is the patient diabetic?  Yes  If diabetic, was a CBG obtained today?  No  Did  the patient bring in their glucometer from home?  No  How often do you monitor your CBG's? Once per month.   Financial Strains and Diabetes Management:  Are you having any financial strains with the device, your supplies or your medication? No .  Does the patient want to be seen by Chronic Care Management for management of their diabetes?  No  Would the patient like to be referred to a Nutritionist or for Diabetic Management?  No   Diabetic Exams:  Diabetic Eye Exam: Completed 04/09/22.  Pt has been advised about the importance in completing this exam. Diabetic Foot Exam: Completed 04/25/22. Pt has been advised about the importance in completing this exam.    Interpreter Needed?: Yes Interpreter ID: [id number] Patient Declined Interpreter : No Patient signed [place] waiver: No  Information entered by :: Kennedy Bucker, LPN   Activities of Daily Living    08/07/2022    2:36 PM 06/23/2022    4:34 PM  In your present state of health, do you have any difficulty performing the following activities:  Hearing? 0 0  Vision? 0 0  Difficulty concentrating or making decisions? 0 0  Walking or climbing stairs? 1 1  Dressing or bathing? 0 0  Doing errands, shopping? 0 0  Preparing Food and eating ? N   Using the Toilet? N   In the past six months, have you accidently leaked urine? N   Do you have problems with loss of bowel control? N    Managing your Medications? N   Managing your Finances? N   Housekeeping or managing your Housekeeping? N     Patient Care Team: Smitty Cords, DO as PCP - General (Family Medicine) Toni Arthurs, MD as Referring Physician (Internal Medicine) Marlene Bast [facility])  Indicate any recent Medical Services you may have received from other than [facility] providers in the past year (date may be approximate).     Assessment:   This is a routine wellness examination for [facility].  Hearing/Vision screen Hearing Screening - Comments:: No aids Vision Screening - Comments:: Wears glasses- Dr.Thurmond  Dietary issues and exercise activities discussed: Current Exercise Habits: Home exercise routine, Type of exercise: strength training/weights, Time (Minutes): 60, Frequency (Times/Week): 3, Weekly Exercise (Minutes/Week): 180, Intensity: Mild   Goals Addressed             This Visit's Progress    DIET - EAT MORE FRUITS AND VEGETABLES         Depression Screen    08/07/2022    2:31 PM 06/23/2022    4:33 PM 04/25/2022   10:24 AM 05/29/2021   10:24 AM 04/17/2021    2:18 PM 01/01/2021    1:38 PM 11/15/2019    2:10 PM  PHQ 2/9 Scores  PHQ - 2 Score 2 0 2 0 0 0 0  PHQ- 9 Score 4  3 0 0      Fall Risk    08/07/2022    2:35 PM 06/23/2022    4:34 PM 05/29/2021   10:24 AM 04/17/2021    2:18 PM 01/01/2021    1:38 PM  Fall Risk   Falls in the past year? 0 0 0 0 0  Number falls in past yr: 0  0 0   Injury with Fall? 0  0 0   Risk for fall due to : No Fall Risks  No Fall Risks No Fall Risks Medication side effect  Follow up Falls prevention discussed  Falls  evaluation completed Falls evaluation completed Falls evaluation completed;Education provided;Falls prevention discussed    FALL RISK PREVENTION PERTAINING TO THE HOME:  Any stairs in or around the home? Yes  If so, are there any without handrails? No  Home free of loose throw rugs in walkways, pet beds, electrical cords,  etc? Yes  Adequate lighting in your home to reduce risk of falls? Yes   ASSISTIVE DEVICES UTILIZED TO PREVENT FALLS:  Life alert? No  Use of a cane, walker or w/c? Yes - cane Grab bars in the bathroom? No  Shower chair or bench in shower? Yes  Elevated toilet seat or a handicapped toilet? No    Cognitive Function:        08/07/2022    2:42 PM  6CIT Screen  What Year? 0 points  What month? 0 points  What time? 0 points  Count back from 20 0 points  Months in reverse 0 points  Repeat phrase 2 points  Total Score 2 points    Immunizations Immunization History  Administered Date(s) Administered   Fluad Quad(high Dose 65+) 11/05/2018, 11/15/2019, 12/12/2020, 04/25/2022   Influenza Inj Mdck Quad Pf 12/02/2017   Influenza, High Dose Seasonal PF 11/24/2016   Influenza,inj,Quad PF,6+ Mos 12/15/2012, 12/07/2014   Influenza-Unspecified 12/07/2014, 02/13/2016, 12/02/2017   PFIZER(Purple Top)SARS-COV-2 Vaccination 04/25/2019, 05/16/2019, 04/02/2020   Pneumococcal Conjugate-13 04/07/2017   Pneumococcal Polysaccharide-23 04/25/2013, 04/13/2018   Tdap 12/15/2012   Zoster Recombinat (Shingrix) 06/22/2016, 04/15/2018    TDAP status: Up to date  Flu Vaccine status: Up to date  Pneumococcal vaccine status: Up to date  Covid-19 vaccine status: Completed vaccines  Qualifies for Shingles Vaccine? Yes   Zostavax completed No   Shingrix Completed?: Yes  Screening Tests Health Maintenance  Topic Date Due   COVID-19 Vaccine (4 - 2023-24 season) 11/01/2021   HEMOGLOBIN A1C  10/01/2022   INFLUENZA VACCINE  10/02/2022   DTaP/Tdap/Td (2 - Td or Tdap) 12/16/2022   Diabetic kidney evaluation - Urine ACR  04/03/2023   OPHTHALMOLOGY EXAM  04/10/2023   FOOT EXAM  04/26/2023   Diabetic kidney evaluation - eGFR measurement  06/18/2023   Medicare Annual Wellness (AWV)  08/07/2023   Colonoscopy  06/26/2032   Pneumonia Vaccine 1+ Years old  Completed   Hepatitis C Screening  Completed    Zoster Vaccines- Shingrix  Completed   HPV VACCINES  Aged Out    Health Maintenance  Health Maintenance Due  Topic Date Due   COVID-19 Vaccine (4 - 2023-24 season) 11/01/2021    Colorectal cancer screening: Type of screening: Colonoscopy. Completed 06/27/22. Repeat every 10 years  Lung Cancer Screening: (Low Dose CT Chest recommended if Age 57-80 years, 30 pack-year currently smoking OR have quit w/in 15years.) does not qualify.    Additional Screening:  Hepatitis C Screening: does qualify; Completed 09/08/20  Vision Screening: Recommended annual ophthalmology exams for early detection of glaucoma and other disorders of the eye. Is the patient up to date with their annual eye exam?  Yes  Who is the provider or what is the name of the office in which the patient attends annual eye exams? Dr.Thurmond If pt is not established with a provider, would they like to be referred to a provider to establish care? No .   Dental Screening: Recommended annual dental exams for proper oral hygiene  Community Resource Referral / Chronic Care Management: CRR required this visit?  No   CCM required this visit?  No  Plan:     I have personally reviewed and noted the following in the patient's chart:   Medical and social history Use of alcohol, tobacco or illicit drugs  Current medications and supplements including opioid prescriptions. Patient is not currently taking opioid prescriptions. Functional ability and status Nutritional status Physical activity Advanced directives List of other physicians Hospitalizations, surgeries, and ER visits in previous 12 months Vitals Screenings to include cognitive, depression, and falls Referrals and appointments  In addition, I have reviewed and discussed with patient certain preventive protocols, quality metrics, and best practice recommendations. A written personalized care plan for preventive services as well as general preventive health  recommendations were provided to patient.     Hal Hope, LPN   03/08/1094   Nurse Notes: none

## 2022-08-07 NOTE — Patient Instructions (Signed)
Ross Riley , Thank you for taking time to come for your Medicare Wellness Visit. I appreciate your ongoing commitment to your health goals. Please review the following plan we discussed and let me know if I can assist you in the future.   These are the goals we discussed:  Goals      DIET - EAT MORE FRUITS AND VEGETABLES     DIET - INCREASE WATER INTAKE     Recommend drinking at least 6-8 glasses of water a day      Patient Stated     01/01/2021, no goals        This is a list of the screening recommended for you and due dates:  Health Maintenance  Topic Date Due   COVID-19 Vaccine (4 - 2023-24 season) 11/01/2021   Hemoglobin A1C  10/01/2022   Flu Shot  10/02/2022   DTaP/Tdap/Td vaccine (2 - Td or Tdap) 12/16/2022   Yearly kidney health urinalysis for diabetes  04/03/2023   Eye exam for diabetics  04/10/2023   Complete foot exam   04/26/2023   Yearly kidney function blood test for diabetes  06/18/2023   Medicare Annual Wellness Visit  08/07/2023   Colon Cancer Screening  06/26/2032   Pneumonia Vaccine  Completed   Hepatitis C Screening  Completed   Zoster (Shingles) Vaccine  Completed   HPV Vaccine  Aged Out    Advanced directives: no  Conditions/risks identified: none  Next appointment: Follow up in one year for your annual wellness visit. 08/13/23 @ 2:30 pm by phone  Preventive Care 65 Years and Older, Male  Preventive care refers to lifestyle choices and visits with your health care provider that can promote health and wellness. What does preventive care include? A yearly physical exam. This is also called an annual well check. Dental exams once or twice a year. Routine eye exams. Ask your health care provider how often you should have your eyes checked. Personal lifestyle choices, including: Daily care of your teeth and gums. Regular physical activity. Eating a healthy diet. Avoiding tobacco and drug use. Limiting alcohol use. Practicing safe sex. Taking low  doses of aspirin every day. Taking vitamin and mineral supplements as recommended by your health care provider. What happens during an annual well check? The services and screenings done by your health care provider during your annual well check will depend on your age, overall health, lifestyle risk factors, and family history of disease. Counseling  Your health care provider may ask you questions about your: Alcohol use. Tobacco use. Drug use. Emotional well-being. Home and relationship well-being. Sexual activity. Eating habits. History of falls. Memory and ability to understand (cognition). Work and work Astronomer. Screening  You may have the following tests or measurements: Height, weight, and BMI. Blood pressure. Lipid and cholesterol levels. These may be checked every 5 years, or more frequently if you are over 75 years old. Skin check. Lung cancer screening. You may have this screening every year starting at age 33 if you have a 30-pack-year history of smoking and currently smoke or have quit within the past 15 years. Fecal occult blood test (FOBT) of the stool. You may have this test every year starting at age 47. Flexible sigmoidoscopy or colonoscopy. You may have a sigmoidoscopy every 5 years or a colonoscopy every 10 years starting at age 42. Prostate cancer screening. Recommendations will vary depending on your family history and other risks. Hepatitis C blood test. Hepatitis B blood test. Sexually transmitted  disease (STD) testing. Diabetes screening. This is done by checking your blood sugar (glucose) after you have not eaten for a while (fasting). You may have this done every 1-3 years. Abdominal aortic aneurysm (AAA) screening. You may need this if you are a current or former smoker. Osteoporosis. You may be screened starting at age 35 if you are at high risk. Talk with your health care provider about your test results, treatment options, and if necessary, the need  for more tests. Vaccines  Your health care provider may recommend certain vaccines, such as: Influenza vaccine. This is recommended every year. Tetanus, diphtheria, and acellular pertussis (Tdap, Td) vaccine. You may need a Td booster every 10 years. Zoster vaccine. You may need this after age 38. Pneumococcal 13-valent conjugate (PCV13) vaccine. One dose is recommended after age 63. Pneumococcal polysaccharide (PPSV23) vaccine. One dose is recommended after age 41. Talk to your health care provider about which screenings and vaccines you need and how often you need them. This information is not intended to replace advice given to you by your health care provider. Make sure you discuss any questions you have with your health care provider. Document Released: 03/16/2015 Document Revised: 11/07/2015 Document Reviewed: 12/19/2014 Elsevier Interactive Patient Education  2017 [employer].  Fall Prevention in the Home Falls can cause injuries. They can happen to people of all ages. There are many things you can do to make your home safe and to help prevent falls. What can I do on the outside of my home? Regularly fix the edges of walkways and driveways and fix any cracks. Remove anything that might make you trip as you walk through a door, such as a raised step or threshold. Trim any bushes or trees on the path to your home. Use bright outdoor lighting. Clear any walking paths of anything that might make someone trip, such as rocks or tools. Regularly check to see if handrails are loose or broken. Make sure that both sides of any steps have handrails. Any raised decks and porches should have guardrails on the edges. Have any leaves, snow, or ice cleared regularly. Use sand or salt on walking paths during winter. Clean up any spills in your garage right away. This includes oil or grease spills. What can I do in the bathroom? Use night lights. Install grab bars by the toilet and in the tub and  shower. Do not use towel bars as grab bars. Use non-skid mats or decals in the tub or shower. If you need to sit down in the shower, use a plastic, non-slip stool. Keep the floor dry. Clean up any water that spills on the floor as soon as it happens. Remove soap buildup in the tub or shower regularly. Attach bath mats securely with double-sided non-slip rug tape. Do not have throw rugs and other things on the floor that can make you trip. What can I do in the bedroom? Use night lights. Make sure that you have a light by your bed that is easy to reach. Do not use any sheets or blankets that are too big for your bed. They should not hang down onto the floor. Have a firm chair that has side arms. You can use this for support while you get dressed. Do not have throw rugs and other things on the floor that can make you trip. What can I do in the kitchen? Clean up any spills right away. Avoid walking on wet floors. Keep items that you use a  lot in easy-to-reach places. If you need to reach something above you, use a strong step stool that has a grab bar. Keep electrical cords out of the way. Do not use floor polish or wax that makes floors slippery. If you must use wax, use non-skid floor wax. Do not have throw rugs and other things on the floor that can make you trip. What can I do with my stairs? Do not leave any items on the stairs. Make sure that there are handrails on both sides of the stairs and use them. Fix handrails that are broken or loose. Make sure that handrails are as long as the stairways. Check any carpeting to make sure that it is firmly attached to the stairs. Fix any carpet that is loose or worn. Avoid having throw rugs at the top or bottom of the stairs. If you do have throw rugs, attach them to the floor with carpet tape. Make sure that you have a light switch at the top of the stairs and the bottom of the stairs. If you do not have them, ask someone to add them for  you. What else can I do to help prevent falls? Wear shoes that: Do not have high heels. Have rubber bottoms. Are comfortable and fit you well. Are closed at the toe. Do not wear sandals. If you use a stepladder: Make sure that it is fully opened. Do not climb a closed stepladder. Make sure that both sides of the stepladder are locked into place. Ask someone to hold it for you, if possible. Clearly mark and make sure that you can see: Any grab bars or handrails. First and last steps. Where the edge of each step is. Use tools that help you move around (mobility aids) if they are needed. These include: Canes. Walkers. Scooters. Crutches. Turn on the lights when you go into a dark area. Replace any light bulbs as soon as they burn out. Set up your furniture so you have a clear path. Avoid moving your furniture around. If any of your floors are uneven, fix them. If there are any pets around you, be aware of where they are. Review your medicines with your doctor. Some medicines can make you feel dizzy. This can increase your chance of falling. Ask your doctor what other things that you can do to help prevent falls. This information is not intended to replace advice given to you by your health care provider. Make sure you discuss any questions you have with your health care provider. Document Released: 12/14/2008 Document Revised: 07/26/2015 Document Reviewed: 03/24/2014 Elsevier Interactive Patient Education  2017 [employer].

## 2022-09-16 ENCOUNTER — Other Ambulatory Visit: Payer: Self-pay | Admitting: Family Medicine

## 2022-09-16 DIAGNOSIS — I10 Essential (primary) hypertension: Secondary | ICD-10-CM

## 2022-09-17 NOTE — Telephone Encounter (Signed)
Requested Prescriptions  Pending Prescriptions Disp Refills   metoprolol tartrate (LOPRESSOR) 25 MG tablet [Pharmacy Med Name: METOPROLOL TARTRATE 25MG  TABLETS] 270 tablet 0    Sig: TAKE 1/2 TABLET(12.5 MG) BY MOUTH TWICE DAILY     Cardiovascular:  Beta Blockers Passed - 09/16/2022  3:00 PM      Passed - Last BP in normal range    BP Readings from Last 1 Encounters:  06/23/22 123/71         Passed - Last Heart Rate in normal range    Pulse Readings from Last 1 Encounters:  06/23/22 60         Passed - Valid encounter within last 6 months    Recent Outpatient Visits           2 months ago Colicky LLQ abdominal pain   [place] [facility] [place], Netta Neat, DO   4 months ago Type 2 diabetes mellitus with diabetic cataract, without long-term current use of insulin [facility])   [facility] Smitty Cords, DO   7 months ago Viral URI with cough   [place] [facility] Smitty Cords, DO   9 months ago Viral URI with cough   [facility] [place], Salvadore Oxford, NP   10 months ago LUQ pain   [place] [facility] [place], Salvadore Oxford, NP       Future Appointments             In 1 month Althea Charon, Netta Neat, DO [place] [facility], [facility]

## 2022-10-02 DIAGNOSIS — E039 Hypothyroidism, unspecified: Secondary | ICD-10-CM | POA: Diagnosis not present

## 2022-10-02 DIAGNOSIS — E1159 Type 2 diabetes mellitus with other circulatory complications: Secondary | ICD-10-CM | POA: Diagnosis not present

## 2022-10-02 DIAGNOSIS — E785 Hyperlipidemia, unspecified: Secondary | ICD-10-CM | POA: Diagnosis not present

## 2022-10-02 DIAGNOSIS — I152 Hypertension secondary to endocrine disorders: Secondary | ICD-10-CM | POA: Diagnosis not present

## 2022-10-02 DIAGNOSIS — E119 Type 2 diabetes mellitus without complications: Secondary | ICD-10-CM | POA: Diagnosis not present

## 2022-10-02 DIAGNOSIS — E1169 Type 2 diabetes mellitus with other specified complication: Secondary | ICD-10-CM | POA: Diagnosis not present

## 2022-10-02 LAB — HEMOGLOBIN A1C: Hemoglobin A1C: 7.3

## 2022-10-08 DIAGNOSIS — Z96662 Presence of left artificial ankle joint: Secondary | ICD-10-CM | POA: Diagnosis not present

## 2022-10-08 DIAGNOSIS — M216X2 Other acquired deformities of left foot: Secondary | ICD-10-CM | POA: Diagnosis not present

## 2022-10-08 DIAGNOSIS — M792 Neuralgia and neuritis, unspecified: Secondary | ICD-10-CM | POA: Diagnosis not present

## 2022-10-08 DIAGNOSIS — M19072 Primary osteoarthritis, left ankle and foot: Secondary | ICD-10-CM | POA: Diagnosis not present

## 2022-10-08 DIAGNOSIS — M85872 Other specified disorders of bone density and structure, left ankle and foot: Secondary | ICD-10-CM | POA: Diagnosis not present

## 2022-10-08 DIAGNOSIS — M24572 Contracture, left ankle: Secondary | ICD-10-CM | POA: Diagnosis not present

## 2022-10-12 ENCOUNTER — Other Ambulatory Visit: Payer: Self-pay | Admitting: Family Medicine

## 2022-10-12 DIAGNOSIS — I10 Essential (primary) hypertension: Secondary | ICD-10-CM

## 2022-10-13 DIAGNOSIS — H401132 Primary open-angle glaucoma, bilateral, moderate stage: Secondary | ICD-10-CM | POA: Diagnosis not present

## 2022-10-16 ENCOUNTER — Ambulatory Visit: Payer: Self-pay

## 2022-10-16 NOTE — Telephone Encounter (Signed)
Using [employer] [phone]. Call was disconnected Summary: sore throat   Pt has sore throat since yesterday and wants appt w/ dr, but nothing available. please advise

## 2022-10-16 NOTE — Telephone Encounter (Addendum)
.  Using [facility] [phone].   Chief Complaint: Sore throat Symptoms: 7/10 pain, redness to throat Frequency: Yesterday Pertinent Negatives: Patient denies other symptoms Disposition: [] ED /[x] Urgent Care (no appt availability in office) / [] Appointment(In office/virtual)/ []  [facility]/ [] Home Care/ [x] Refused Recommended Disposition /[] [facility]/ []  Follow-up with PCP Additional Notes: Patient advised due to no availability in the office to go to an UC so that his throat can be swabbed for strep or even COVID. He says that he doesn't understand why I asked all the questions if I wasn't able to schedule, all he needs is an antibiotic for the sore throat. Advised the questions are needed to assess the symptoms he's having and make a determination as to scheduling appointment or going to the UC. But unfortunately no available appointments. I asked if he wants me to call the scheduler to see if maybe something is available that I can't see on the schedule, he says yes. I called the office x 2, no answer, recording to leave a message, which I didn't do. Advised patient someone will call him back to let him know if he can be seen in the office, he verbalized understanding.    Summary: sore throat     Pt has sore throat since yesterday and wants appt w/ dr, but nothing available. please advise     Reason for Disposition  [1] Sore throat is the only symptom AND [2] present > 48 hours  Answer Assessment - Initial Assessment Questions 1. ONSET: "When did the throat start hurting?" (Hours or days ago)      Yesterday 2. SEVERITY: "How bad is the sore throat?" (Scale 1-10; mild, moderate or severe)   - MILD (1-3):  Doesn't interfere with eating or normal activities.   - MODERATE (4-7): Interferes with eating some solids and normal activities.   - SEVERE (8-10):  Excruciating pain, interferes with most normal activities.   - SEVERE WITH DYSPHAGIA (10): Can't  swallow liquids, drooling.     7 3. STREP EXPOSURE: "Has there been any exposure to strep within the past week?" If Yes, ask: "What type of contact occurred?"      No 4.  VIRAL SYMPTOMS: "Are there any symptoms of a cold, such as a runny nose, cough, hoarse voice or red eyes?"      No 5. FEVER: "Do you have a fever?" If Yes, ask: "What is your temperature, how was it measured, and when did it start?"     No 6. PUS ON THE TONSILS: "Is there pus on the tonsils in the back of your throat?"     Redness 7. OTHER SYMPTOMS: "Do you have any other symptoms?" (e.g., difficulty breathing, headache, rash)     No  Protocols used: Sore Throat-A-AH

## 2022-10-16 NOTE — Telephone Encounter (Signed)
Advised pt that we do not have openings today or tomorrow.  He will go to an Urgent Care.    Thanks,   -Vernona Rieger

## 2022-10-24 ENCOUNTER — Encounter: Payer: Self-pay | Admitting: Family Medicine

## 2022-10-24 ENCOUNTER — Ambulatory Visit: Payer: 59 | Admitting: Family Medicine

## 2022-10-24 VITALS — BP 123/71 | HR 58 | Ht 66.0 in | Wt 173.0 lb

## 2022-10-24 DIAGNOSIS — L821 Other seborrheic keratosis: Secondary | ICD-10-CM | POA: Diagnosis not present

## 2022-10-24 DIAGNOSIS — E1136 Type 2 diabetes mellitus with diabetic cataract: Secondary | ICD-10-CM | POA: Diagnosis not present

## 2022-10-24 DIAGNOSIS — M25572 Pain in left ankle and joints of left foot: Secondary | ICD-10-CM | POA: Diagnosis not present

## 2022-10-24 DIAGNOSIS — G8929 Other chronic pain: Secondary | ICD-10-CM | POA: Diagnosis not present

## 2022-10-24 NOTE — Patient Instructions (Addendum)
Thank you for coming to the office today.  Recent Labs    04/02/22 0000 10/02/22 0000  HGBA1C 7.1 7.3   Keep up the great work on your A1c sugar.  Handicap placard is completed, good for 5 years.  Dermatology Referral for the Skin Growths to be removed.  [facility]   [address], [place] [address] Hours: 8AM-5PM Phone: [phone]  [facility] Dermatology [address] [place], [place] [address] Phone: [phone]  [facility] Dermatologist in [place] Address: [address] [place], [place] [address] Phone: [phone]   Please schedule a Follow-up Appointment to: Return in about 6 months (around 04/26/2023) for 6 month Annual physical early afternoon apt, labs AFTER.  If you have any other questions or concerns, please feel free to call the office or send a message through MyChart. You may also schedule an earlier appointment if necessary.  Additionally, you may be receiving a survey about your experience at our office within a few days to 1 week by e-mail or mail. We value your feedback.  Saralyn Pilar, DO [facility], [place]

## 2022-10-24 NOTE — Assessment & Plan Note (Signed)
Followed by [facility] Endocrinology Last A1c 7.3 at Endocrine No known hypoglycemia. Complications - DM cataracts removed, other including hyperlipidemia, GERD, hypothyroidism - increases risk of future cardiovascular complications   Plan:  1. Continue current therapy - Glipizide XL 5mg  daily - caution hypoglycemia 2. Encourage improved lifestyle - low carb, low sugar diet, reduce portion size, continue improving regular exercise 3. Check CBG , bring log to next visit for review 4. Continue ASA, Statin

## 2022-10-24 NOTE — Progress Notes (Signed)
Subjective:    Patient ID: Ross Riley, male    DOB: 12-29-1950, 72 y.o.   MRN: [id number]  Ross Riley is a 72 y.o. male presenting on 10/24/2022 for Diabetes   HPI  Diabetes, Type 2 Followed by [facility] Endocrinology A1c 7.3 (10/2022) Meds: Glipizide XL 5mg  daily Reports good compliance. Tolerating well w/o side-effects Currently on ACEi, ASA, Statin Lifestyle: - Diet (balanced diet) - Exercise (limited activity due to chronic LLE issues see note) He will return to [facility] placard  SKs Multiple locations on L abdomen flank and other areas with benign appearing SKs       10/24/2022    2:25 PM 08/07/2022    2:31 PM 06/23/2022    4:33 PM  Depression screen PHQ 2/9  Decreased Interest 0 1 0  Down, Depressed, Hopeless 1 1 0  PHQ - 2 Score 1 2 0  Altered sleeping 0 1   Tired, decreased energy 1 1   Change in appetite 0 0   Feeling bad or failure about yourself  0 0   Trouble concentrating 0 0   Moving slowly or fidgety/restless 0 0   Suicidal thoughts 3 0   PHQ-9 Score 5 4   Difficult doing work/chores Somewhat difficult Not difficult at all     Social History   Tobacco Use   Smoking status: Former    Current packs/day: 0.00    Average packs/day: 1 pack/day for 32.0 years (32.0 ttl pk-yrs)    Types: Cigarettes    Start date: 03/04/1959    Quit date: 03/04/1991    Years since quitting: 31.6   Smokeless tobacco: Former  Building services engineer status: Never Used  Substance Use Topics   Alcohol use: Not Currently    Alcohol/week: 1.0 standard drink of alcohol    Types: 1 Shots of liquor per week   Drug use: No    Review of Systems Per HPI unless specifically indicated above     Objective:    BP 123/71   Pulse (!) 58   Ht 5\' 6"  (1.676 m)   Wt 173 lb (78.5 kg)   SpO2 98%   BMI 27.92 kg/m   Wt Readings from Last 3 Encounters:  10/24/22 173 lb (78.5 kg)  08/07/22 169 lb (76.7 kg)  06/23/22 169 lb (76.7 kg)    Physical Exam Vitals and  nursing note reviewed.  Constitutional:      General: He is not in acute distress.    Appearance: Normal appearance. He is well-developed. He is not diaphoretic.     Comments: Well-appearing, comfortable, cooperative  HENT:     Head: Normocephalic and atraumatic.  Eyes:     General:        Right eye: No discharge.        Left eye: No discharge.     Conjunctiva/sclera: Conjunctivae normal.  Cardiovascular:     Rate and Rhythm: Normal rate.  Pulmonary:     Effort: Pulmonary effort is normal.  Skin:    General: Skin is warm and dry.     Findings: No erythema or rash.  Neurological:     Mental Status: He is alert and oriented to person, place, and time.  Psychiatric:        Mood and Affect: Mood normal.        Behavior: Behavior normal.        Thought Content: Thought content normal.  Comments: Well groomed, good eye contact, normal speech and thoughts     Recent Labs    04/02/22 0000 10/02/22 0000  HGBA1C 7.1 7.3    Results for orders placed or performed in visit on 06/25/22  HM DIABETES EYE EXAM  Result Value Ref Range   HM Diabetic Eye Exam No Retinopathy No Retinopathy      Assessment & Plan:   Problem List Items Addressed This Visit     Type 2 diabetes mellitus with diabetic cataract, without long-term current use of insulin (HCC) - Primary    Followed by [facility] Endocrinology Last A1c 7.3 at Endocrine No known hypoglycemia. Complications - DM cataracts removed, other including hyperlipidemia, GERD, hypothyroidism - increases risk of future cardiovascular complications   Plan:  1. Continue current therapy - Glipizide XL 5mg  daily - caution hypoglycemia 2. Encourage improved lifestyle - low carb, low sugar diet, reduce portion size, continue improving regular exercise 3. Check CBG , bring log to next visit for review 4. Continue ASA, Statin       Other Visit Diagnoses     Seborrheic keratosis       Relevant Orders   Ambulatory referral to Dermatology        referral to Dermatology for multiple benign appearing SKs, excision removal, abdomen flank and L neck / face   Orders Placed This Encounter  Procedures   Ambulatory referral to Dermatology    Referral Priority:   Routine    Referral Type:   Consultation    Referral Reason:   Specialty Services Required    Requested Specialty:   Dermatology    Number of Visits Requested:   1     No orders of the defined types were placed in this encounter.     Follow up plan: Return in about 6 months (around 04/26/2023) for 6 month Annual physical early afternoon apt, labs AFTER.   Ross Pilar, DO [facility] 10/24/2022, 2:42 PM

## 2022-11-04 ENCOUNTER — Other Ambulatory Visit: Payer: Self-pay | Admitting: Family Medicine

## 2022-11-04 DIAGNOSIS — I10 Essential (primary) hypertension: Secondary | ICD-10-CM

## 2022-11-04 MED ORDER — BENAZEPRIL-HYDROCHLOROTHIAZIDE 10-12.5 MG PO TABS
1.0000 | ORAL_TABLET | Freq: Every day | ORAL | 0 refills | Status: DC
Start: 2022-11-04 — End: 2022-12-22

## 2022-11-04 NOTE — Telephone Encounter (Signed)
Requested Prescriptions  Pending Prescriptions Disp Refills   benazepril-hydrochlorthiazide (LOTENSIN HCT) 10-12.5 MG tablet 90 tablet 0    Sig: Take 1 tablet by mouth daily.     Cardiovascular:  ACEI + Diuretic Combos Failed - 11/04/2022  1:24 PM      Failed - K in normal range and within 180 days    Potassium  Date Value Ref Range Status  06/18/2022 3.4 (L) 3.5 - 5.1 mmol/L Final  12/07/2012 3.3 (L) 3.5 - 5.1 mmol/L Final         Passed - Na in normal range and within 180 days    Sodium  Date Value Ref Range Status  06/18/2022 139 135 - 145 mmol/L Final  06/19/2015 142 134 - 144 mmol/L Final  12/07/2012 139 136 - 145 mmol/L Final         Passed - Cr in normal range and within 180 days    Creat  Date Value Ref Range Status  11/07/2021 0.99 0.70 - 1.28 mg/dL Final   Creatinine, Ser  Date Value Ref Range Status  06/18/2022 0.93 0.61 - 1.24 mg/dL Final   Creatinine, POC  Date Value Ref Range Status  06/18/2015 0 mg/dL Final         Passed - eGFR is 30 or above and within 180 days    GFR, Est African American  Date Value Ref Range Status  11/09/2019 83 > OR = 60 mL/min/1.7m2 Final   GFR, Est Non African American  Date Value Ref Range Status  11/09/2019 72 > OR = 60 mL/min/1.49m2 Final   GFR, Estimated  Date Value Ref Range Status  06/18/2022 >60 >60 mL/min Final    Comment:    (NOTE) Calculated using the CKD-EPI Creatinine Equation (2021)    eGFR  Date Value Ref Range Status  11/07/2021 82 > OR = 60 mL/min/1.60m2 Final         Passed - Patient is not pregnant      Passed - Last BP in normal range    BP Readings from Last 1 Encounters:  10/24/22 123/71         Passed - Valid encounter within last 6 months    Recent Outpatient Visits           1 week ago Type 2 diabetes mellitus with diabetic cataract, without long-term current use of insulin [facility])   [place] [facility], Netta Neat, DO   4 months ago Colicky LLQ  abdominal pain   [place] [facility] Smitty Cords, DO   6 months ago Type 2 diabetes mellitus with diabetic cataract, without long-term current use of insulin [facility])   [place] [facility] Smitty Cords, DO   8 months ago Viral URI with cough   [facility] Smitty Cords, DO   10 months ago Viral URI with cough   [place] [facility] [place], Salvadore Oxford, NP       Future Appointments             In 5 months Althea Charon, Netta Neat, DO  [facility], [facility]

## 2022-11-04 NOTE — Telephone Encounter (Signed)
Medication Refill - Medication:  benazepril-hydrochlorthiazide (LOTENSIN HCT) 10-12.5 MG tablet   Has the patient contacted their pharmacy? Yes.    (Agent: If yes, when and what did the pharmacy advise?)  Preferred Pharmacy (with phone number or street name):  [id number] [place], [place] - [address] AT [facility] [address] [place]  [address] [place] [address]  Phone: [phone] Fax: [phone]  Hours: Not open 24 hours   Has the patient been seen for an appointment in the last year OR does the patient have an upcoming appointment? Yes.    Agent: Please be advised that RX refills may take up to 3 business days. We ask that you follow-up with your pharmacy.

## 2022-11-04 NOTE — Telephone Encounter (Signed)
[facility] called and spoke to Ross Riley, Pensions consultant about the refill(s) benazepril-hydrochlorothiazide requested. Advised it was sent on 10/14/22 #90/0 refill(s). She says it was not received. Advised will resend.

## 2022-11-11 ENCOUNTER — Other Ambulatory Visit: Payer: Self-pay | Admitting: Family Medicine

## 2022-11-11 DIAGNOSIS — I10 Essential (primary) hypertension: Secondary | ICD-10-CM

## 2022-11-21 ENCOUNTER — Other Ambulatory Visit: Payer: Self-pay | Admitting: Family Medicine

## 2022-11-21 DIAGNOSIS — G8929 Other chronic pain: Secondary | ICD-10-CM | POA: Diagnosis not present

## 2022-11-21 DIAGNOSIS — E1136 Type 2 diabetes mellitus with diabetic cataract: Secondary | ICD-10-CM

## 2022-11-21 DIAGNOSIS — M25572 Pain in left ankle and joints of left foot: Secondary | ICD-10-CM | POA: Diagnosis not present

## 2022-11-24 NOTE — Telephone Encounter (Signed)
Requested Prescriptions  Pending Prescriptions Disp Refills   glipiZIDE (GLUCOTROL XL) 5 MG 24 hr tablet [Pharmacy Med Name: GLIPIZIDE ER 5MG  TABLETS] 90 tablet 1    Sig: TAKE 1 TABLET(5 MG) BY MOUTH DAILY WITH BREAKFAST     Endocrinology:  Diabetes - Sulfonylureas Passed - 11/21/2022  3:40 PM      Passed - HBA1C is between 0 and 7.9 and within 180 days    Hemoglobin A1C  Date Value Ref Range Status  10/02/2022 7.3  Final         Passed - Cr in normal range and within 360 days    Creat  Date Value Ref Range Status  11/07/2021 0.99 0.70 - 1.28 mg/dL Final   Creatinine, Ser  Date Value Ref Range Status  06/18/2022 0.93 0.61 - 1.24 mg/dL Final   Creatinine, POC  Date Value Ref Range Status  06/18/2015 0 mg/dL Final         Passed - Valid encounter within last 6 months    Recent Outpatient Visits           1 month ago Type 2 diabetes mellitus with diabetic cataract, without long-term current use of insulin [facility])   [place], Netta Neat, DO   5 months ago Colicky LLQ abdominal pain   [place] [facility] [place], Netta Neat, DO   7 months ago Type 2 diabetes mellitus with diabetic cataract, without long-term current use of insulin [facility])   [place] [facility] Smitty Cords, DO   9 months ago Viral URI with cough   [facility] Smitty Cords, DO   11 months ago Viral URI with cough   [place] [facility] [place], Salvadore Oxford, NP       Future Appointments             In 5 months Althea Charon, Netta Neat, DO [facility], [facility]

## 2022-12-04 DIAGNOSIS — L538 Other specified erythematous conditions: Secondary | ICD-10-CM | POA: Diagnosis not present

## 2022-12-04 DIAGNOSIS — R208 Other disturbances of skin sensation: Secondary | ICD-10-CM | POA: Diagnosis not present

## 2022-12-04 DIAGNOSIS — L820 Inflamed seborrheic keratosis: Secondary | ICD-10-CM | POA: Diagnosis not present

## 2022-12-04 DIAGNOSIS — D225 Melanocytic nevi of trunk: Secondary | ICD-10-CM | POA: Diagnosis not present

## 2022-12-04 DIAGNOSIS — L2989 Other pruritus: Secondary | ICD-10-CM | POA: Diagnosis not present

## 2022-12-04 DIAGNOSIS — L821 Other seborrheic keratosis: Secondary | ICD-10-CM | POA: Diagnosis not present

## 2022-12-20 ENCOUNTER — Inpatient Hospital Stay
Admission: EM | Admit: 2022-12-20 | Discharge: 2022-12-22 | DRG: 321 | Disposition: A | Discharge status: Home / Self Care | Payer: 59 | Attending: Internal Medicine | Admitting: Internal Medicine

## 2022-12-20 ENCOUNTER — Other Ambulatory Visit: Payer: Self-pay

## 2022-12-20 ENCOUNTER — Emergency Department: Payer: 59

## 2022-12-20 ENCOUNTER — Encounter: Payer: Self-pay | Admitting: Emergency Medicine

## 2022-12-20 ENCOUNTER — Inpatient Hospital Stay (HOSPITAL_COMMUNITY)
Admit: 2022-12-20 | Discharge: 2022-12-20 | Disposition: A | Discharge status: Home / Self Care | Payer: 59 | Attending: Cardiovascular Disease | Admitting: Cardiovascular Disease

## 2022-12-20 ENCOUNTER — Encounter
Admission: EM | Disposition: A | Discharge status: Home / Self Care | Payer: Self-pay | Source: Home / Self Care | Attending: Internal Medicine

## 2022-12-20 DIAGNOSIS — Z555 Less than a high school diploma: Secondary | ICD-10-CM

## 2022-12-20 DIAGNOSIS — E1169 Type 2 diabetes mellitus with other specified complication: Secondary | ICD-10-CM | POA: Diagnosis present

## 2022-12-20 DIAGNOSIS — E876 Hypokalemia: Secondary | ICD-10-CM | POA: Diagnosis present

## 2022-12-20 DIAGNOSIS — I255 Ischemic cardiomyopathy: Secondary | ICD-10-CM

## 2022-12-20 DIAGNOSIS — N400 Enlarged prostate without lower urinary tract symptoms: Secondary | ICD-10-CM | POA: Diagnosis present

## 2022-12-20 DIAGNOSIS — Z87891 Personal history of nicotine dependence: Secondary | ICD-10-CM

## 2022-12-20 DIAGNOSIS — I5021 Acute systolic (congestive) heart failure: Secondary | ICD-10-CM | POA: Diagnosis not present

## 2022-12-20 DIAGNOSIS — R10819 Abdominal tenderness, unspecified site: Secondary | ICD-10-CM | POA: Diagnosis not present

## 2022-12-20 DIAGNOSIS — I2109 ST elevation (STEMI) myocardial infarction involving other coronary artery of anterior wall: Secondary | ICD-10-CM | POA: Diagnosis not present

## 2022-12-20 DIAGNOSIS — R079 Chest pain, unspecified: Secondary | ICD-10-CM | POA: Diagnosis not present

## 2022-12-20 DIAGNOSIS — H269 Unspecified cataract: Secondary | ICD-10-CM | POA: Diagnosis present

## 2022-12-20 DIAGNOSIS — I2102 ST elevation (STEMI) myocardial infarction involving left anterior descending coronary artery: Secondary | ICD-10-CM | POA: Diagnosis present

## 2022-12-20 DIAGNOSIS — E039 Hypothyroidism, unspecified: Secondary | ICD-10-CM | POA: Diagnosis present

## 2022-12-20 DIAGNOSIS — Z9079 Acquired absence of other genital organ(s): Secondary | ICD-10-CM

## 2022-12-20 DIAGNOSIS — I10 Essential (primary) hypertension: Secondary | ICD-10-CM | POA: Diagnosis present

## 2022-12-20 DIAGNOSIS — K219 Gastro-esophageal reflux disease without esophagitis: Secondary | ICD-10-CM | POA: Diagnosis present

## 2022-12-20 DIAGNOSIS — Z87442 Personal history of urinary calculi: Secondary | ICD-10-CM

## 2022-12-20 DIAGNOSIS — I2510 Atherosclerotic heart disease of native coronary artery without angina pectoris: Secondary | ICD-10-CM | POA: Diagnosis present

## 2022-12-20 DIAGNOSIS — Z8249 Family history of ischemic heart disease and other diseases of the circulatory system: Secondary | ICD-10-CM

## 2022-12-20 DIAGNOSIS — Z96652 Presence of left artificial knee joint: Secondary | ICD-10-CM | POA: Diagnosis not present

## 2022-12-20 DIAGNOSIS — R1110 Vomiting, unspecified: Secondary | ICD-10-CM | POA: Diagnosis not present

## 2022-12-20 DIAGNOSIS — I499 Cardiac arrhythmia, unspecified: Secondary | ICD-10-CM | POA: Diagnosis not present

## 2022-12-20 DIAGNOSIS — Z79899 Other long term (current) drug therapy: Secondary | ICD-10-CM

## 2022-12-20 DIAGNOSIS — Z888 Allergy status to other drugs, medicaments and biological substances status: Secondary | ICD-10-CM

## 2022-12-20 DIAGNOSIS — Z7984 Long term (current) use of oral hypoglycemic drugs: Secondary | ICD-10-CM

## 2022-12-20 DIAGNOSIS — H409 Unspecified glaucoma: Secondary | ICD-10-CM | POA: Diagnosis present

## 2022-12-20 DIAGNOSIS — Z743 Need for continuous supervision: Secondary | ICD-10-CM | POA: Diagnosis not present

## 2022-12-20 DIAGNOSIS — H5789 Other specified disorders of eye and adnexa: Secondary | ICD-10-CM | POA: Diagnosis present

## 2022-12-20 DIAGNOSIS — Z79890 Hormone replacement therapy (postmenopausal): Secondary | ICD-10-CM | POA: Diagnosis not present

## 2022-12-20 DIAGNOSIS — I110 Hypertensive heart disease with heart failure: Secondary | ICD-10-CM | POA: Diagnosis not present

## 2022-12-20 DIAGNOSIS — I213 ST elevation (STEMI) myocardial infarction of unspecified site: Secondary | ICD-10-CM | POA: Diagnosis not present

## 2022-12-20 DIAGNOSIS — F1010 Alcohol abuse, uncomplicated: Secondary | ICD-10-CM | POA: Diagnosis present

## 2022-12-20 DIAGNOSIS — E1136 Type 2 diabetes mellitus with diabetic cataract: Secondary | ICD-10-CM | POA: Diagnosis not present

## 2022-12-20 DIAGNOSIS — I319 Disease of pericardium, unspecified: Secondary | ICD-10-CM | POA: Diagnosis not present

## 2022-12-20 DIAGNOSIS — M1990 Unspecified osteoarthritis, unspecified site: Secondary | ICD-10-CM | POA: Diagnosis present

## 2022-12-20 DIAGNOSIS — I959 Hypotension, unspecified: Secondary | ICD-10-CM | POA: Diagnosis not present

## 2022-12-20 DIAGNOSIS — E785 Hyperlipidemia, unspecified: Secondary | ICD-10-CM | POA: Diagnosis present

## 2022-12-20 DIAGNOSIS — R001 Bradycardia, unspecified: Secondary | ICD-10-CM | POA: Diagnosis present

## 2022-12-20 HISTORY — PX: LEFT HEART CATH AND CORONARY ANGIOGRAPHY: CATH118249

## 2022-12-20 HISTORY — PX: CORONARY/GRAFT ACUTE MI REVASCULARIZATION: CATH118305

## 2022-12-20 LAB — LIPID PANEL
Cholesterol: 172 mg/dL (ref 0–200)
HDL: 49 mg/dL (ref >40)
LDL Cholesterol: 90 mg/dL (ref 0–99)
Total CHOL/HDL Ratio: 3.5 {ratio}
Triglycerides: 166 mg/dL — ABNORMAL HIGH (ref <150)
VLDL: 33 mg/dL (ref 0–40)

## 2022-12-20 LAB — CBC
HCT: 45.6 % (ref 39.0–52.0)
Hemoglobin: 14.9 g/dL (ref 13.0–17.0)
MCH: 29.2 pg (ref 26.0–34.0)
MCHC: 32.7 g/dL (ref 30.0–36.0)
MCV: 89.4 fL (ref 80.0–100.0)
Platelets: 201 10*3/uL (ref 150–400)
RBC: 5.1 MIL/uL (ref 4.22–5.81)
RDW: 12.4 % (ref 11.5–15.5)
WBC: 8.3 10*3/uL (ref 4.0–10.5)
nRBC: 0 % (ref 0.0–0.2)

## 2022-12-20 LAB — ECHOCARDIOGRAM COMPLETE
AR max vel: 2.62 cm2
AV Peak grad: 6.3 mm[Hg]
Ao pk vel: 1.25 m/s
Area-P 1/2: 3.08 cm2
Calc EF: 42.8 %
Height: 66 in
P 1/2 time: 517 ms
S' Lateral: 3.8 cm
Single Plane A2C EF: 44.5 %
Single Plane A4C EF: 46.1 %
Weight: 2860.69 [oz_av]

## 2022-12-20 LAB — TROPONIN I (HIGH SENSITIVITY)
Troponin I (High Sensitivity): 772 ng/L (ref <18)
Troponin I (High Sensitivity): >24000 ng/L (ref <18)

## 2022-12-20 LAB — POCT ACTIVATED CLOTTING TIME: Activated Clotting Time: 281 s

## 2022-12-20 LAB — BASIC METABOLIC PANEL
Anion gap: 10 (ref 5–15)
BUN: 14 mg/dL (ref 8–23)
CO2: 25 mmol/L (ref 22–32)
Calcium: 8.7 mg/dL — ABNORMAL LOW (ref 8.9–10.3)
Chloride: 104 mmol/L (ref 98–111)
Creatinine, Ser: 0.89 mg/dL (ref 0.61–1.24)
GFR, Estimated: >60 mL/min (ref >60)
Glucose, Bld: 166 mg/dL — ABNORMAL HIGH (ref 70–99)
Potassium: 3.6 mmol/L (ref 3.5–5.1)
Sodium: 139 mmol/L (ref 135–145)

## 2022-12-20 LAB — GLUCOSE, CAPILLARY
Glucose-Capillary: 146 mg/dL — ABNORMAL HIGH (ref 70–99)
Glucose-Capillary: 144 mg/dL — ABNORMAL HIGH (ref 70–99)

## 2022-12-20 LAB — PROTIME-INR
INR: 1.2 (ref 0.8–1.2)
Prothrombin Time: 15.5 s — ABNORMAL HIGH (ref 11.4–15.2)

## 2022-12-20 LAB — HEMOGLOBIN A1C
Hgb A1c MFr Bld: 7 % — ABNORMAL HIGH (ref 4.8–5.6)
Mean Plasma Glucose: 154.2 mg/dL

## 2022-12-20 LAB — APTT: aPTT: 159 s — ABNORMAL HIGH (ref 24–36)

## 2022-12-20 LAB — MRSA NEXT GEN BY PCR, NASAL: MRSA by PCR Next Gen: NOT DETECTED

## 2022-12-20 LAB — LIPASE, BLOOD: Lipase: 33 U/L (ref 11–51)

## 2022-12-20 LAB — ETHANOL: Alcohol, Ethyl (B): <10 mg/dL (ref <10)

## 2022-12-20 SURGERY — CORONARY/GRAFT ACUTE MI REVASCULARIZATION
Anesthesia: Moderate Sedation

## 2022-12-20 MED ORDER — HEPARIN (PORCINE) IN NACL 1000-0.9 UT/500ML-% IV SOLN
INTRAVENOUS | Status: AC
  Filled 2022-12-20: qty 1000

## 2022-12-20 MED ORDER — ENOXAPARIN SODIUM 40 MG/0.4ML IJ SOSY: 40.0000 mg | PREFILLED_SYRINGE | INTRAMUSCULAR | Status: DC

## 2022-12-20 MED ORDER — GABAPENTIN 300 MG PO CAPS
300.0000 mg | ORAL_CAPSULE | Freq: Two times a day (BID) | ORAL | Status: DC
  Administered 2022-12-20 – 2022-12-22 (×4): 300 mg via ORAL
  Filled 2022-12-20 (×4): qty 1

## 2022-12-20 MED ORDER — INSULIN ASPART 100 UNIT/ML IJ SOLN
0.0000 [IU] | Freq: Three times a day (TID) | INTRAMUSCULAR | Status: DC
  Administered 2022-12-20: 2 [IU] via SUBCUTANEOUS
  Administered 2022-12-21 (×2): 3 [IU] via SUBCUTANEOUS
  Administered 2022-12-21 – 2022-12-22 (×3): 2 [IU] via SUBCUTANEOUS
  Filled 2022-12-20 (×6): qty 1

## 2022-12-20 MED ORDER — HEPARIN SODIUM (PORCINE) 1000 UNIT/ML IJ SOLN
INTRAMUSCULAR | Status: DC | PRN
  Administered 2022-12-20: 2000 [IU] via INTRAVENOUS
  Administered 2022-12-20: 4000 [IU] via INTRAVENOUS

## 2022-12-20 MED ORDER — SODIUM CHLORIDE 0.9% FLUSH: 3.0000 mL | INTRAVENOUS | Status: DC | PRN

## 2022-12-20 MED ORDER — HEPARIN SODIUM (PORCINE) 1000 UNIT/ML IJ SOLN
INTRAMUSCULAR | Status: AC
  Filled 2022-12-20: qty 10

## 2022-12-20 MED ORDER — NITROGLYCERIN 2 % TD OINT
1.0000 [in_us] | TOPICAL_OINTMENT | Freq: Four times a day (QID) | TRANSDERMAL | Status: AC | PRN
  Administered 2022-12-20 – 2022-12-21 (×2): 1 [in_us] via TOPICAL
  Filled 2022-12-20 (×2): qty 1

## 2022-12-20 MED ORDER — ATORVASTATIN CALCIUM 80 MG PO TABS
80.0000 mg | ORAL_TABLET | Freq: Every day | ORAL | Status: DC
  Administered 2022-12-20 – 2022-12-22 (×3): 80 mg via ORAL
  Filled 2022-12-20: qty 4
  Filled 2022-12-20 (×2): qty 1
  Filled 2022-12-20: qty 4

## 2022-12-20 MED ORDER — ONDANSETRON HCL 4 MG PO TABS: 4.0000 mg | ORAL_TABLET | Freq: Four times a day (QID) | ORAL | Status: DC | PRN

## 2022-12-20 MED ORDER — SODIUM CHLORIDE 0.9 % IV SOLN: 250.0000 mL | INTRAVENOUS | Status: AC | PRN

## 2022-12-20 MED ORDER — FENTANYL CITRATE (PF) 100 MCG/2ML IJ SOLN
INTRAMUSCULAR | Status: DC | PRN
  Administered 2022-12-20: 50 ug via INTRAVENOUS

## 2022-12-20 MED ORDER — ORAL CARE MOUTH RINSE: 15.0000 mL | OROMUCOSAL | Status: DC | PRN

## 2022-12-20 MED ORDER — NITROGLYCERIN 0.4 MG SL SUBL
0.4000 mg | SUBLINGUAL_TABLET | SUBLINGUAL | Status: DC | PRN
  Administered 2022-12-20: 0.4 mg via SUBLINGUAL
  Filled 2022-12-20: qty 1

## 2022-12-20 MED ORDER — ONDANSETRON HCL 4 MG/2ML IJ SOLN
4.0000 mg | Freq: Once | INTRAMUSCULAR | Status: AC
  Administered 2022-12-20: 4 mg via INTRAVENOUS

## 2022-12-20 MED ORDER — ASPIRIN 81 MG PO CHEW
324.0000 mg | CHEWABLE_TABLET | Freq: Once | ORAL | Status: AC
  Administered 2022-12-20: 324 mg via ORAL

## 2022-12-20 MED ORDER — MELATONIN 5 MG PO TABS: 5.0000 mg | ORAL_TABLET | Freq: Every evening | ORAL | Status: DC | PRN

## 2022-12-20 MED ORDER — MORPHINE SULFATE (PF) 4 MG/ML IV SOLN
4.0000 mg | INTRAVENOUS | Status: DC | PRN
Start: 2022-12-20 — End: 2022-12-20

## 2022-12-20 MED ORDER — LIDOCAINE HCL (PF) 1 % IJ SOLN
INTRAMUSCULAR | Status: DC | PRN
  Administered 2022-12-20 (×2): 2 mL

## 2022-12-20 MED ORDER — IOHEXOL 300 MG/ML  SOLN
INTRAMUSCULAR | Status: DC | PRN
  Administered 2022-12-20: 104 mL

## 2022-12-20 MED ORDER — ONDANSETRON HCL 4 MG/2ML IJ SOLN
4.0000 mg | Freq: Once | INTRAMUSCULAR | Status: DC
  Filled 2022-12-20: qty 2

## 2022-12-20 MED ORDER — MORPHINE SULFATE (PF) 4 MG/ML IV SOLN: 4.0000 mg | Freq: Once | INTRAVENOUS | Status: DC

## 2022-12-20 MED ORDER — LEVOTHYROXINE SODIUM 50 MCG PO TABS
75.0000 ug | ORAL_TABLET | Freq: Every day | ORAL | Status: DC
  Administered 2022-12-21 – 2022-12-22 (×2): 75 ug via ORAL
  Filled 2022-12-20: qty 2
  Filled 2022-12-20: qty 1

## 2022-12-20 MED ORDER — FENTANYL CITRATE (PF) 100 MCG/2ML IJ SOLN
INTRAMUSCULAR | Status: AC
  Filled 2022-12-20: qty 2

## 2022-12-20 MED ORDER — ADULT MULTIVITAMIN W/MINERALS CH
1.0000 | ORAL_TABLET | Freq: Every day | ORAL | Status: DC
  Administered 2022-12-20 – 2022-12-22 (×3): 1 via ORAL
  Filled 2022-12-20 (×3): qty 1

## 2022-12-20 MED ORDER — SENNOSIDES-DOCUSATE SODIUM 8.6-50 MG PO TABS: 1.0000 | ORAL_TABLET | Freq: Every evening | ORAL | Status: DC | PRN

## 2022-12-20 MED ORDER — ONDANSETRON HCL 4 MG/2ML IJ SOLN: 4.0000 mg | Freq: Four times a day (QID) | INTRAMUSCULAR | Status: DC | PRN

## 2022-12-20 MED ORDER — LATANOPROST 0.005 % OP SOLN
1.0000 [drp] | Freq: Every day | OPHTHALMIC | Status: DC
  Administered 2022-12-20 – 2022-12-21 (×2): 1 [drp] via OPHTHALMIC
  Filled 2022-12-20: qty 2.5

## 2022-12-20 MED ORDER — MORPHINE SULFATE (PF) 2 MG/ML IV SOLN
2.0000 mg | Freq: Once | INTRAVENOUS | Status: AC
  Administered 2022-12-20: 2 mg via INTRAVENOUS
  Filled 2022-12-20: qty 1

## 2022-12-20 MED ORDER — PANTOPRAZOLE SODIUM 40 MG PO TBEC
40.0000 mg | DELAYED_RELEASE_TABLET | Freq: Every day | ORAL | Status: DC
  Administered 2022-12-20 – 2022-12-22 (×3): 40 mg via ORAL
  Filled 2022-12-20 (×3): qty 1

## 2022-12-20 MED ORDER — HEPARIN SODIUM (PORCINE) 5000 UNIT/ML IJ SOLN: 5000.0000 [IU] | Freq: Three times a day (TID) | INTRAMUSCULAR | Status: DC

## 2022-12-20 MED ORDER — ACETAMINOPHEN 325 MG PO TABS
650.0000 mg | ORAL_TABLET | Freq: Four times a day (QID) | ORAL | Status: DC | PRN
  Administered 2022-12-20: 650 mg via ORAL
  Filled 2022-12-20: qty 2

## 2022-12-20 MED ORDER — INSULIN ASPART 100 UNIT/ML IJ SOLN
0.0000 [IU] | Freq: Every day | INTRAMUSCULAR | Status: DC
  Administered 2022-12-20: 2 [IU] via SUBCUTANEOUS
  Filled 2022-12-20: qty 1

## 2022-12-20 MED ORDER — MORPHINE SULFATE (PF) 2 MG/ML IV SOLN
2.0000 mg | INTRAVENOUS | Status: DC | PRN
Start: 2022-12-20 — End: 2022-12-20

## 2022-12-20 MED ORDER — ACETAMINOPHEN 650 MG RE SUPP: 650.0000 mg | Freq: Four times a day (QID) | RECTAL | Status: DC | PRN

## 2022-12-20 MED ORDER — ASPIRIN 81 MG PO CHEW
81.0000 mg | CHEWABLE_TABLET | Freq: Every day | ORAL | Status: DC
  Administered 2022-12-21 – 2022-12-22 (×2): 81 mg via ORAL
  Filled 2022-12-20 (×2): qty 1

## 2022-12-20 MED ORDER — SODIUM CHLORIDE 0.9% FLUSH
3.0000 mL | Freq: Two times a day (BID) | INTRAVENOUS | Status: DC
  Administered 2022-12-20 – 2022-12-22 (×4): 3 mL via INTRAVENOUS

## 2022-12-20 MED ORDER — DORZOLAMIDE HCL-TIMOLOL MAL 2-0.5 % OP SOLN
1.0000 [drp] | Freq: Two times a day (BID) | OPHTHALMIC | Status: DC
  Administered 2022-12-20 – 2022-12-22 (×4): 1 [drp] via OPHTHALMIC
  Filled 2022-12-20 (×2): qty 10

## 2022-12-20 MED ORDER — TICAGRELOR 90 MG PO TABS
ORAL_TABLET | ORAL | Status: AC
  Filled 2022-12-20: qty 2

## 2022-12-20 MED ORDER — ACETAMINOPHEN 325 MG PO TABS: 650.0000 mg | ORAL_TABLET | ORAL | Status: DC | PRN

## 2022-12-20 MED ORDER — TICAGRELOR 90 MG PO TABS
90.0000 mg | ORAL_TABLET | Freq: Two times a day (BID) | ORAL | Status: DC
  Administered 2022-12-20 – 2022-12-22 (×4): 90 mg via ORAL
  Filled 2022-12-20 (×4): qty 1

## 2022-12-20 MED ORDER — MORPHINE SULFATE (PF) 2 MG/ML IV SOLN
2.0000 mg | INTRAVENOUS | Status: AC | PRN
Start: 2022-12-20 — End: 2022-12-20
  Administered 2022-12-20: 2 mg via INTRAVENOUS
  Filled 2022-12-20: qty 1

## 2022-12-20 MED ORDER — HEPARIN (PORCINE) IN NACL 1000-0.9 UT/500ML-% IV SOLN
INTRAVENOUS | Status: DC | PRN
  Administered 2022-12-20 (×2): 500 mL

## 2022-12-20 MED ORDER — HEPARIN SODIUM (PORCINE) 5000 UNIT/ML IJ SOLN
4000.0000 [IU] | Freq: Once | INTRAMUSCULAR | Status: AC
  Administered 2022-12-20: 4000 [IU] via INTRAVENOUS

## 2022-12-20 MED ORDER — CHLORHEXIDINE GLUCONATE CLOTH 2 % EX PADS: 6.0000 | MEDICATED_PAD | Freq: Every day | CUTANEOUS | Status: DC

## 2022-12-20 MED ORDER — LORAZEPAM 2 MG/ML IJ SOLN: 1.0000 mg | INTRAMUSCULAR | Status: DC | PRN

## 2022-12-20 MED ORDER — TICAGRELOR 90 MG PO TABS
ORAL_TABLET | ORAL | Status: DC | PRN
  Administered 2022-12-20: 180 mg via ORAL

## 2022-12-20 MED ORDER — SODIUM CHLORIDE 0.9 % IV SOLN: INTRAVENOUS | Status: AC

## 2022-12-20 MED ORDER — FOLIC ACID 1 MG PO TABS
1.0000 mg | ORAL_TABLET | Freq: Every day | ORAL | Status: DC
Start: 2022-12-20 — End: 2022-12-22
  Administered 2022-12-20 – 2022-12-22 (×3): 1 mg via ORAL
  Filled 2022-12-20 (×3): qty 1

## 2022-12-20 MED ORDER — ENOXAPARIN SODIUM 40 MG/0.4ML IJ SOSY
40.0000 mg | PREFILLED_SYRINGE | INTRAMUSCULAR | Status: DC
  Administered 2022-12-21 – 2022-12-22 (×2): 40 mg via SUBCUTANEOUS
  Filled 2022-12-20 (×2): qty 0.4

## 2022-12-20 MED ORDER — VERAPAMIL HCL 2.5 MG/ML IV SOLN
INTRAVENOUS | Status: DC | PRN
  Administered 2022-12-20: 2.5 mg via INTRAVENOUS

## 2022-12-20 MED ORDER — SODIUM CHLORIDE 0.9 % IV SOLN
INTRAVENOUS | Status: AC | PRN
  Administered 2022-12-20: 250 mL via INTRAVENOUS

## 2022-12-20 MED ORDER — THIAMINE MONONITRATE 100 MG PO TABS
100.0000 mg | ORAL_TABLET | Freq: Every day | ORAL | Status: DC
Start: 2022-12-20 — End: 2022-12-22
  Administered 2022-12-20 – 2022-12-22 (×3): 100 mg via ORAL
  Filled 2022-12-20 (×3): qty 1

## 2022-12-20 MED ORDER — THIAMINE HCL 100 MG/ML IJ SOLN
100.0000 mg | Freq: Every day | INTRAMUSCULAR | Status: DC
Start: 2022-12-20 — End: 2022-12-22

## 2022-12-20 MED ORDER — LIDOCAINE HCL 1 % IJ SOLN
INTRAMUSCULAR | Status: AC
  Filled 2022-12-20: qty 20

## 2022-12-20 MED ORDER — INFLUENZA VAC A&B SURF ANT ADJ 0.5 ML IM SUSY
0.5000 mL | PREFILLED_SYRINGE | INTRAMUSCULAR | Status: DC
  Filled 2022-12-20: qty 0.5

## 2022-12-20 MED ORDER — LORAZEPAM 1 MG PO TABS
1.0000 mg | ORAL_TABLET | ORAL | Status: DC | PRN
  Administered 2022-12-20: 1 mg via ORAL
  Filled 2022-12-20: qty 1

## 2022-12-20 MED ORDER — VERAPAMIL HCL 2.5 MG/ML IV SOLN
INTRAVENOUS | Status: AC
  Filled 2022-12-20: qty 2

## 2022-12-20 SURGICAL SUPPLY — 19 items
BALLN TREK RX 2.5X15 (BALLOONS) ×1
BALLN ~~LOC~~ TREK NEO RX 3.75X15 (BALLOONS) ×1
BALLOON TREK RX 2.5X15 (BALLOONS) IMPLANT
BALLOON ~~LOC~~ TREK NEO RX 3.75X15 (BALLOONS) IMPLANT
CATH INFINITI JR4 5F (CATHETERS) IMPLANT
CATH LAUNCHER 6FR EBU3.5 (CATHETERS) IMPLANT
DEVICE RAD TR BAND REGULAR (VASCULAR PRODUCTS) IMPLANT
DRAPE BRACHIAL (DRAPES) IMPLANT
GLIDESHEATH SLEND SS 6F .021 (SHEATH) IMPLANT
GUIDEWIRE INQWIRE 1.5J.035X260 (WIRE) IMPLANT
INQWIRE 1.5J .035X260CM (WIRE) ×1
KIT ENCORE 26 ADVANTAGE (KITS) IMPLANT
PACK CARDIAC CATH (CUSTOM PROCEDURE TRAY) ×1 IMPLANT
PROTECTION STATION PRESSURIZED (MISCELLANEOUS) ×1
SET ATX-X65L (MISCELLANEOUS) IMPLANT
STATION PROTECTION PRESSURIZED (MISCELLANEOUS) IMPLANT
STENT ONYX FRONTIER 3.5X34 (Permanent Stent) IMPLANT
TUBING CIL FLEX 10 FLL-RA (TUBING) IMPLANT
WIRE RUNTHROUGH .014X180CM (WIRE) IMPLANT

## 2022-12-20 NOTE — Progress Notes (Signed)
[facility] Progress Note  Received message from nursing at approximately 18:22: Patient endorsing 4 out of 10 chest pain, increased from 3 out of 10 post cath.  Patient asking for pain medication, patient did not want acetaminophen earlier.  Nursing not sure if this is alcohol withdrawal or chest pain.  Vitals: BP 141/81 HR 65, 98 percent rm air RR 25.  # Chest pain in setting of STEMI status post PCI to LAD - Cardiology recommends nitroglycerin sublingual and if patient does not improve, can have morphine 2 mg one-time dose. - Nitroglycerin sublingual, every 5 minutes.  For chest pain ordered; morphine 2 mg IV every 4 hours as needed for severe pain, 1 doses ordered - Continue close monitoring in stepdown  Dr. Sedalia Muta

## 2022-12-20 NOTE — ED Notes (Signed)
Stemi called at 10:57

## 2022-12-20 NOTE — H&P (Signed)
History and Physical   Ross Riley HYQ:[id number] DOB: 1950-11-11 DOA: 12/20/2022  PCP: Ross Cords, DO Outpatient Specialists: Dr. Jerrel Riley clinic endocrinology Patient coming from: Home via EMS  I have personally briefly reviewed patient's old medical records in [facility] EMR.  Chief Concern: Chest pain  HPI: [name] is a 72 year old male with history of hyperlipidemia, non-insulin-dependent diabetes mellitus, hypertension, hypothyroid, GERD, who presents to the emergency department for chief concerns of nonradiating left-sided chest pain.  Vitals in the ED showed temperature of 97.8, respiration rate of 20, heart rate of 69, blood pressure 147/108, SpO2 100% on room air.  Serum sodium is 139, potassium 3.6, chloride 104, bicarb 25, BUN of 14, serum creatinine of 0.89, EGFR greater than 60, nonfasting blood glucose 166, WBC 8.3, hemoglobin 14.9, platelets of 201.  High sensitive troponin was 772.  ED treatment: Aspirin 324 mg p.o., morphine 2 mg IV one-time dose, ondansetron 4 mg IV, heparin loading dose.  Code STEMI was called.  Patient was taken to the Cath Lab for cardiovascular revascularization.  Per cardiologist, In the Cath Lab, patient was found to have occluded LAD and is status post DES PCI. ------------------------------------------- At bedside, patient was able to tell me his name, age, location, current calendar year.  His daughter, Ross Riley, was used as interpreter service at patient's request.  Patient reports that he was sleeping when he woke up with chest pressure.  He endorses associated shortness of breath and right arm pain and discomfort.  He denies jaw and neck discomfort and abnormal taste in his mouth.  He reports his pain is similar to the pain that he had about a year ago.  He reports that over the last year, he has had worsening shortness of breath with exertion.  He denies trauma to his person.  Social history: He  lives at home with his wife.  He denies current tobacco use and he denies recreational drug use.  He currently drinks about 1 large shots (1-2 standard shots) of Smirnoff vodka.  He is retired  ROS: Constitutional: no weight change, no fever ENT/Mouth: no sore throat, no rhinorrhea Eyes: no eye pain, no vision changes Cardiovascular: + chest pain, + dyspnea,  no edema, no palpitations Respiratory: no cough, no sputum, no wheezing Gastrointestinal: no nausea, no vomiting, no diarrhea, no constipation Genitourinary: no urinary incontinence, no dysuria, no hematuria Musculoskeletal: no arthralgias, no myalgias Skin: no skin lesions, no pruritus, Neuro: + weakness, no loss of consciousness, no syncope Psych: no anxiety, no depression, no decrease appetite Heme/Lymph: no bruising, no bleeding  Assessment/Plan  Principal Problem:   Acute ST elevation myocardial infarction (STEMI) due to occlusion of left anterior descending (LAD) coronary artery (HCC) Active Problems:   Type 2 diabetes mellitus with diabetic cataract, without long-term current use of insulin (HCC)   Hyperlipidemia associated with type 2 diabetes mellitus (HCC)   Essential hypertension   BPH (benign prostatic hyperplasia)   S/P TKR (total knee replacement), left   Hypothyroidism   Alcohol abuse   Assessment and Plan:  * Acute ST elevation myocardial infarction (STEMI) due to occlusion of left anterior descending (LAD) coronary artery (HCC) Patient is status post DES PCI to the LAD per STEMI cardiologist. Continue dual antiplatelet therapy per cardiology recommendation Continue high intensity statin medication, atorvastatin 80 mg daily Admit to stepdown, inpatient  Alcohol abuse Check serum ETOH level, add to prior labs Counseling given to decrease alcohol use to every other day or once per  week if able Patient and family endorses understanding and compliance at bedside CIWA precaution  initiated  Hypothyroidism Home levothyroxine 75 mcg daily resumed  Essential hypertension Home benazepril-hydrochlorothiazide 10-12.5 mg daily, metoprolol tartrate 12.5 mg p.o. twice daily not resumed on admission, antihypertensive medication per cardiology specialist recommendations  Hyperlipidemia associated with type 2 diabetes mellitus (HCC) Atorvastatin 80 mg daily resumed  Type 2 diabetes mellitus with diabetic cataract, without long-term current use of insulin (HCC) Home glipizide 5 mg daily not resumed on admission Insulin SSI with at bedtime coverage ordered Goal inpatient blood glucose levels 140-180  Chart reviewed.   DVT prophylaxis: Heparin 5000 units subcutaneous every 8 hours Code Status: Full code Diet: Heart healthy Family Communication: Updated spouse and daughter, [name] at bedside with patient's permission Disposition Plan: Pending clinical course, cardiology service final recommendation Consults called: Cardiology Admission status: Stepdown, inpatient  Past Medical History:  Diagnosis Date   Arthritis    BPH (benign prostatic hyperplasia)    Chronic kidney disease    kidney stones   Dystonia    ED (erectile dysfunction)    GERD (gastroesophageal reflux disease)    Glaucoma (increased eye pressure)    Hematuria    Hemorrhoids    Hyperlipidemia    Hypertension    Thyroid disease    Past Surgical History:  Procedure Laterality Date   CATARACT EXTRACTION Bilateral    EXTRACORPOREAL SHOCK WAVE LITHOTRIPSY Right    JOINT REPLACEMENT Left 08/2016   left knee, [place]   KIDNEY STONE SURGERY  2009   PROSTATE SURGERY  2011   TRANSURETHRAL RESECTION OF PROSTATE N/A 12/06/2014   Procedure: TRANSURETHRAL RESECTION OF THE PROSTATE (TURP);  Surgeon: Hildred Laser, MD;  Location: [facility] ORS;  Service: Urology;  Laterality: N/A;   Social History:  reports that he quit smoking about 31 years ago. His smoking use included cigarettes. He started smoking about 63  years ago. He has a 32 pack-year smoking history. He has quit using smokeless tobacco. He reports current alcohol use of about 7.0 standard drinks of alcohol per week. He reports that he does not use drugs.  Allergies  Allergen Reactions   Metformin And Related Other (See Comments)    Headache and dizzines   Metformin Hcl Other (See Comments)    headache and dizziness   Family History  Problem Relation Age of Onset   Heart disease Mother    Hypertension Brother    Family history: Family history reviewed and not pertinent.  Prior to Admission medications   Medication Sig Start Date End Date Taking? Authorizing Provider  atorvastatin (LIPITOR) 20 MG tablet Take 1 tablet (20 mg total) by mouth at bedtime. 05/14/22   Karamalegos, Netta Neat, DO  benazepril-hydrochlorthiazide (LOTENSIN HCT) 10-12.5 MG tablet Take 1 tablet by mouth daily. 11/04/22   Karamalegos, Netta Neat, DO  Blood Glucose Monitoring Suppl (ACCU-CHEK AVIVA PLUS) w/Device KIT Use device to check blood sugar up to 1 time daily as directed 07/24/17   Althea Charon, Netta Neat, DO  dicyclomine (BENTYL) 10 MG capsule Take 1 capsule (10 mg total) by mouth 4 (four) times daily -  before meals and at bedtime. As needed for abdominal pain cramping 06/23/22   Ross Cords, DO  dorzolamide-timolol (COSOPT) 22.3-6.8 MG/ML ophthalmic solution Place 1 drop into both eyes 2 (two) times daily.    [provider]  ezetimibe (ZETIA) 10 MG tablet Take 10 mg by mouth daily.    [provider]  gabapentin (NEURONTIN) 300 MG  capsule Take 1 capsule (300 mg total) by mouth 2 (two) times daily. 04/25/22   Karamalegos, Netta Neat, DO  glipiZIDE (GLUCOTROL XL) 5 MG 24 hr tablet TAKE 1 TABLET(5 MG) BY MOUTH DAILY WITH BREAKFAST 11/24/22   Karamalegos, Netta Neat, DO  glucose blood test strip check blood sugar up to 1 time daily as directed 07/24/17   Althea Charon, Netta Neat, DO  latanoprost (XALATAN) 0.005 % ophthalmic solution  INT 1 GTT INTO OU QHS 10/20/17   [provider]  levothyroxine (SYNTHROID) 75 MCG tablet Take 75 mcg by mouth. 09/13/18   [provider]  metoprolol tartrate (LOPRESSOR) 25 MG tablet Take 0.5 tablets (12.5 mg total) by mouth 2 (two) times daily. 11/11/22   Althea Charon, Netta Neat, DO  omeprazole (PRILOSEC) 40 MG capsule TAKE 1 CAPSULE(40 MG) BY MOUTH DAILY BEFORE BREAKFAST 07/22/22   Ross Cords, DO   Physical Exam: Vitals:   12/20/22 1211 12/20/22 1216 12/20/22 1221 12/20/22 1233  BP: 110/70 113/74 113/74 100/75  Pulse: (!) 58 (!) 59 (!) 0   Resp: (!) 23 (!) 23  (!) 21  Temp:    (!) 97.5 F (36.4 C)  TempSrc:    Oral  SpO2: 97% 99%  97%  Weight:    81.1 kg  Height:       Constitutional: appears age-appropriate, NAD, calm Eyes: PERRL, lids and conjunctivae normal ENMT: Mucous membranes are moist. Posterior pharynx clear of any exudate or lesions. Age-appropriate dentition. Hearing appropriate Neck: normal, supple, no masses, no thyromegaly Respiratory: clear to auscultation bilaterally, no wheezing, no crackles. Normal respiratory effort. No accessory muscle use.  Cardiovascular: Regular rate and rhythm, no murmurs / rubs / gallops. No extremity edema. 2+ pedal pulses. No carotid bruits.  Abdomen: no tenderness, no masses palpated, no hepatosplenomegaly. Bowel sounds positive.  Musculoskeletal: no clubbing / cyanosis. No joint deformity upper and lower extremities. Good ROM, no contractures, no atrophy. Normal muscle tone.  Skin: no rashes, lesions, ulcers. No induration Neurologic: Sensation intact. Strength 5/5 in all 4.  Psychiatric: Normal judgment and insight. Alert and oriented x 3. Normal mood.   EKG: independently reviewed, showing sinus rhythm with a rate of 57, ST elevation in leads V1 to V3, ST depression in leads II, III, aVF and V5 to V6.  QTc 452  Chest x-ray on Admission: I personally reviewed and I agree with radiologist reading as  below.  CARDIAC CATHETERIZATION  Result Date: 12/20/2022   Prox LAD to Mid LAD lesion is 100% stenosed.   Dist LAD lesion is 60% stenosed.   A drug-eluting stent was successfully placed using a STENT ONYX FRONTIER 3.5X34.   Post intervention, there is a 0% residual stenosis.   There is mild to moderate left ventricular systolic dysfunction.   LV end diastolic pressure is normal.   The left ventricular ejection fraction is 35-45% by visual estimate. 1.  Acute anterior ST elevation myocardial infarction due to thrombotic occlusion of the proximal/mid LAD.  Left dominant coronary arteries with no other obstructive disease other than 60% stenosis in the distal LAD. 2.  Mildly to moderately reduced LV systolic function with low normal LVEDP at 8 mmHg. 3.  Successful angioplasty and drug-eluting stent placement to the LAD.  Both first and second diagonals were jailed by the stent and there was sluggish flow in second diagonal but the branch was too small to rescue. Recommendations: Dual antiplatelet therapy for at least 1 year. Aggressive treatment of risk factors.  Will obtain  an echocardiogram to evaluate EF and start small dose carvedilol once blood pressure allows.  Will need to start other heart failure medications as well once his blood pressure is improved.  He did have transient hypotension that responded to IV fluids. Please note that first EKG at 9: 59 am was not diagnostic.  Diagnostic EKG was at 10:55 AM   DG Chest 2 View  Result Date: 12/20/2022 CLINICAL DATA:  Left-sided chest pain beginning this morning. Vomiting. Abdominal tenderness. EXAM: CHEST - 2 VIEW COMPARISON:  07/13/2021 FINDINGS: The heart size and mediastinal contours are within normal limits. Mild scarring again seen in anterior right middle lobe. The lungs are otherwise clear. The visualized skeletal structures are unremarkable. IMPRESSION: No active cardiopulmonary disease. Electronically Signed   By: Danae Orleans M.D.   On:  12/20/2022 10:50    Labs on Admission: I have personally reviewed following labs  CBC: Recent Labs  Lab 12/20/22 1009  WBC 8.3  HGB 14.9  HCT 45.6  MCV 89.4  PLT 201   Basic Metabolic Panel: Recent Labs  Lab 12/20/22 1009  NA 139  K 3.6  CL 104  CO2 25  GLUCOSE 166*  BUN 14  CREATININE 0.89  CALCIUM 8.7*   GFR: Estimated Creatinine Clearance: 76.1 mL/min (by C-G formula based on SCr of 0.89 mg/dL).  Recent Labs  Lab 12/20/22 1009  LIPASE 33   Urine analysis:    Component Value Date/Time   COLORURINE YELLOW (A) 06/15/2022 0619   APPEARANCEUR HAZY (A) 06/15/2022 0619   APPEARANCEUR Clear 12/07/2012 1602   LABSPEC 1.021 06/15/2022 0619   LABSPEC 1.015 12/07/2012 1602   PHURINE 5.0 06/15/2022 0619   GLUCOSEU NEGATIVE 06/15/2022 0619   GLUCOSEU Negative 12/07/2012 1602   HGBUR LARGE (A) 06/15/2022 0619   BILIRUBINUR NEGATIVE 06/15/2022 0619   BILIRUBINUR Negative 12/07/2012 1602   KETONESUR NEGATIVE 06/15/2022 0619   PROTEINUR 30 (A) 06/15/2022 0619   NITRITE NEGATIVE 06/15/2022 0619   LEUKOCYTESUR NEGATIVE 06/15/2022 0619   LEUKOCYTESUR Negative 12/07/2012 1602   This document was prepared using Dragon Voice Recognition software and may include unintentional dictation errors.  Dr. Sedalia Muta [facility]  If 7PM-7AM, please contact overnight-coverage provider If 7AM-7PM, please contact day attending provider [email or web address]  12/20/2022, 1:30 PM

## 2022-12-20 NOTE — Assessment & Plan Note (Signed)
Home glipizide 5 mg daily not resumed on admission Insulin SSI with at bedtime coverage ordered Goal inpatient blood glucose levels 140-180

## 2022-12-20 NOTE — Consult Note (Signed)
Cardiology Consultation   Patient ID: Ross Riley MRN: [id number]; DOB: 1950/10/02  Admit date: 12/20/2022 Date of Consult: 12/20/2022  PCP:  Ross Cords, DO   [facility] Providers Cardiologist:  Dr. Mariah Riley   Patient Profile:   Ross Riley is a 72 y.o. male with a hx of diabetes mellitus, essential hypertension and hyperlipidemia who is being seen 12/20/2022 for the evaluation of anterior STEMI at the request of Dr. Cyril Riley.  History of Present Illness:   Ross Riley is a 72 year old male with prolonged history of diabetes mellitus, essential hypertension and hyperlipidemia.  He was evaluated by Dr. Mariah Riley last year for atypical chest pain.  He had cardiac CTA done that showed moderate proximal LAD stenosis.  Aggressive medical therapy was recommended. He presented with acute onset of chest pain described as tightness sensation with associated nausea this morning.  His initial EKG showed nonspecific changes and the patient initially reported improvement in symptoms.  However, he started having chest pain again and his EKG was repeated at 10:55 AM and was consistent with anterior STEMI.  A code STEMI was activated.  The patient was given 4000 units of unfractionated heparin.  I talked with the patient with the help of an interpreter and recommended proceeding with emergent cardiac catheterization.  The patient was in significant distress.   Past Medical History:  Diagnosis Date   Arthritis    BPH (benign prostatic hyperplasia)    Chronic kidney disease    kidney stones   Dystonia    ED (erectile dysfunction)    GERD (gastroesophageal reflux disease)    Glaucoma (increased eye pressure)    Hematuria    Hemorrhoids    Hyperlipidemia    Hypertension    Thyroid disease     Past Surgical History:  Procedure Laterality Date   CATARACT EXTRACTION Bilateral    EXTRACORPOREAL SHOCK WAVE LITHOTRIPSY Right    JOINT REPLACEMENT Left 08/2016   left  knee, [place]   KIDNEY STONE SURGERY  2009   PROSTATE SURGERY  2011   TRANSURETHRAL RESECTION OF PROSTATE N/A 12/06/2014   Procedure: TRANSURETHRAL RESECTION OF THE PROSTATE (TURP);  Surgeon: Hildred Laser, MD;  Location: [facility] ORS;  Service: Urology;  Laterality: N/A;     Home Medications:  Prior to Admission medications   Medication Sig Start Date End Date Taking? Authorizing Provider  atorvastatin (LIPITOR) 20 MG tablet Take 1 tablet (20 mg total) by mouth at bedtime. 05/14/22   Karamalegos, Netta Neat, DO  benazepril-hydrochlorthiazide (LOTENSIN HCT) 10-12.5 MG tablet Take 1 tablet by mouth daily. 11/04/22   Karamalegos, Netta Neat, DO  Blood Glucose Monitoring Suppl (ACCU-CHEK AVIVA PLUS) w/Device KIT Use device to check blood sugar up to 1 time daily as directed 07/24/17   Althea Charon, Netta Neat, DO  dicyclomine (BENTYL) 10 MG capsule Take 1 capsule (10 mg total) by mouth 4 (four) times daily -  before meals and at bedtime. As needed for abdominal pain cramping 06/23/22   Ross Cords, DO  dorzolamide-timolol (COSOPT) 22.3-6.8 MG/ML ophthalmic solution Place 1 drop into both eyes 2 (two) times daily.    [provider]  ezetimibe (ZETIA) 10 MG tablet Take 10 mg by mouth daily.    [provider]  gabapentin (NEURONTIN) 300 MG capsule Take 1 capsule (300 mg total) by mouth 2 (two) times daily. 04/25/22   Karamalegos, Netta Neat, DO  glipiZIDE (GLUCOTROL XL) 5 MG 24 hr tablet TAKE 1 TABLET(5 MG) BY MOUTH  DAILY WITH BREAKFAST 11/24/22   Karamalegos, Netta Neat, DO  glucose blood test strip check blood sugar up to 1 time daily as directed 07/24/17   Ross Cords, DO  latanoprost (XALATAN) 0.005 % ophthalmic solution INT 1 GTT INTO OU QHS 10/20/17   [provider]  levothyroxine (SYNTHROID) 75 MCG tablet Take 75 mcg by mouth. 09/13/18   [provider]  metoprolol tartrate (LOPRESSOR) 25 MG tablet Take 0.5 tablets (12.5 mg total) by  mouth 2 (two) times daily. 11/11/22   Althea Charon, Netta Neat, DO  omeprazole (PRILOSEC) 40 MG capsule TAKE 1 CAPSULE(40 MG) BY MOUTH DAILY BEFORE BREAKFAST 07/22/22   Ross Cords, DO    Inpatient Medications: Scheduled Meds:  [START ON 12/21/2022] aspirin  81 mg Oral Daily   atorvastatin  80 mg Oral Daily   [START ON 12/21/2022] Chlorhexidine Gluconate Cloth  6 each Topical Q0600   [START ON 12/21/2022] enoxaparin (LOVENOX) injection  40 mg Subcutaneous Q24H   folic acid  1 mg Oral Daily   gabapentin  300 mg Oral BID   insulin aspart  0-15 Units Subcutaneous TID WC   insulin aspart  0-5 Units Subcutaneous QHS   [START ON 12/21/2022] levothyroxine  75 mcg Oral Q0600    morphine injection  4 mg Intravenous Once   multivitamin with minerals  1 tablet Oral Daily   pantoprazole  40 mg Oral Daily   sodium chloride flush  3 mL Intravenous Q12H   thiamine  100 mg Oral Daily   Or   thiamine  100 mg Intravenous Daily   ticagrelor  90 mg Oral BID   Continuous Infusions:  sodium chloride     sodium chloride     PRN Meds: sodium chloride, acetaminophen, LORazepam **OR** LORazepam, melatonin, ondansetron (ZOFRAN) IV, sodium chloride flush  Allergies:    Allergies  Allergen Reactions   Metformin And Related Other (See Comments)    Headache and dizzines   Metformin Hcl Other (See Comments)    headache and dizziness    Social History:   Social History   Socioeconomic History   Marital status: Married    Spouse name: Not on file   Number of children: Not on file   Years of education: Not on file   Highest education level: 9th grade  Occupational History   Occupation: retired  Tobacco Use   Smoking status: Former    Current packs/day: 0.00    Average packs/day: 1 pack/day for 32.0 years (32.0 ttl pk-yrs)    Types: Cigarettes    Start date: 03/04/1959    Quit date: 03/04/1991    Years since quitting: 31.8   Smokeless tobacco: Former  Building services engineer status: Never  Used  Substance and Sexual Activity   Alcohol use: Not Currently    Alcohol/week: 1.0 standard drink of alcohol    Types: 1 Shots of liquor per week   Drug use: No   Sexual activity: Yes    Birth control/protection: None  Other Topics Concern   Not on file  Social History Narrative   Not on file   Social Determinants of Health   Financial Resource Strain: Low Risk  (08/07/2022)   Overall Financial Resource Strain (CARDIA)    Difficulty of Paying Living Expenses: Not very hard  Food Insecurity: No Food Insecurity (08/07/2022)   Hunger Vital Sign    Worried About Running Out of Food in the Last Year: Never true    Ran Out of  Food in the Last Year: Never true  Transportation Needs: No Transportation Needs (08/07/2022)   PRAPARE - Administrator, Civil Service (Medical): No    Lack of Transportation (Non-Medical): No  Physical Activity: Sufficiently Active (08/07/2022)   Exercise Vital Sign    Days of Exercise per Week: 3 days    Minutes of Exercise per Session: 60 min  Stress: No Stress Concern Present (08/07/2022)   [employer] of Occupational Health - Occupational Stress Questionnaire    Feeling of Stress : Not at all  Social Connections: Socially Isolated (08/07/2022)   Social Connection and Isolation Panel [NHANES]    Frequency of Communication with Friends and Family: Never    Frequency of Social Gatherings with Friends and Family: Never    Attends Religious Services: Never    Database administrator or Organizations: No    Attends Banker Meetings: Never    Marital Status: Married  Catering manager Violence: Not At Risk (08/07/2022)   Humiliation, Afraid, Rape, and Kick questionnaire    Fear of Current or Ex-Partner: No    Emotionally Abused: No    Physically Abused: No    Sexually Abused: No    Family History:    Family History  Problem Relation Age of Onset   Heart disease Mother      ROS:  Please see the history of present illness.   All  other ROS reviewed and negative.     Physical Exam/Data:   Vitals:   12/20/22 1211 12/20/22 1216 12/20/22 1221 12/20/22 1233  BP: 110/70 113/74 113/74 100/75  Pulse: (!) 58 (!) 59 (!) 0   Resp: (!) 23 (!) 23  (!) 21  Temp:    (!) 97.5 F (36.4 C)  TempSrc:    Oral  SpO2: 97% 99%  97%  Weight:    81.1 kg  Height:       No intake or output data in the 24 hours ending 12/20/22 1305    12/20/2022   12:33 PM 12/20/2022    9:57 AM 10/24/2022    2:21 PM  Last 3 Weights  Weight (lbs) 178 lb 12.7 oz 172 lb 173 lb  Weight (kg) 81.1 kg 78.019 kg 78.472 kg     Body mass index is 28.86 kg/m.  General:  Well nourished, well developed, in no acute distress HEENT: normal Neck: no JVD Vascular: No carotid bruits; Distal pulses 2+ bilaterally Cardiac:  normal S1, S2; RRR; no murmur  Lungs:  clear to auscultation bilaterally, no wheezing, rhonchi or rales  Abd: soft, nontender, no hepatomegaly  Ext: no edema Musculoskeletal:  No deformities, BUE and BLE strength normal and equal Skin: warm and dry  Neuro:  CNs 2-12 intact, no focal abnormalities noted Psych:  Normal affect   EKG:  The EKG was personally reviewed and demonstrates: Initial EKG showed sinus rhythm with nonspecific ST changes.  EKG at 1055 showed sinus bradycardia with anterior ST elevation with peaked T waves. Telemetry:  Telemetry was personally reviewed and demonstrates:    Relevant CV Studies:   Laboratory Data:  High Sensitivity Troponin:   Recent Labs  Lab 12/20/22 1009  TROPONINIHS 772*     Chemistry Recent Labs  Lab 12/20/22 1009  NA 139  K 3.6  CL 104  CO2 25  GLUCOSE 166*  BUN 14  CREATININE 0.89  CALCIUM 8.7*  GFRNONAA >60  ANIONGAP 10    No results for input(s): "PROT", "ALBUMIN", "AST", "ALT", "ALKPHOS", "  BILITOT" in the last 168 hours. Lipids No results for input(s): "CHOL", "TRIG", "HDL", "LABVLDL", "LDLCALC", "CHOLHDL" in the last 168 hours.  Hematology Recent Labs  Lab 12/20/22 1009   WBC 8.3  RBC 5.10  HGB 14.9  HCT 45.6  MCV 89.4  MCH 29.2  MCHC 32.7  RDW 12.4  PLT 201   Thyroid No results for input(s): "TSH", "FREET4" in the last 168 hours.  BNPNo results for input(s): "BNP", "PROBNP" in the last 168 hours.  DDimer No results for input(s): "DDIMER" in the last 168 hours.   Radiology/Studies:  CARDIAC CATHETERIZATION  Result Date: 12/20/2022   Prox LAD to Mid LAD lesion is 100% stenosed.   Dist LAD lesion is 60% stenosed.   A drug-eluting stent was successfully placed using a STENT ONYX FRONTIER [id number].   Post intervention, there is a 0% residual stenosis.   There is mild to moderate left ventricular systolic dysfunction.   LV end diastolic pressure is normal.   The left ventricular ejection fraction is 35-45% by visual estimate. 1.  Acute anterior ST elevation myocardial infarction due to thrombotic occlusion of the proximal/mid LAD.  Left dominant coronary arteries with no other obstructive disease other than 60% stenosis in the distal LAD. 2.  Mildly to moderately reduced LV systolic function with low normal LVEDP at 8 mmHg. 3.  Successful angioplasty and drug-eluting stent placement to the LAD.  Both first and second diagonals were jailed by the stent and there was sluggish flow in second diagonal but the branch was too small to rescue. Recommendations: Dual antiplatelet therapy for at least 1 year. Aggressive treatment of risk factors.  Will obtain an echocardiogram to evaluate EF and start small dose carvedilol once blood pressure allows.  Will need to start other heart failure medications as well once his blood pressure is improved.  He did have transient hypotension that responded to IV fluids. Please note that first EKG at 9: 59 am was not diagnostic.  Diagnostic EKG was at 10:55 AM   DG Chest 2 View  Result Date: 12/20/2022 CLINICAL DATA:  Left-sided chest pain beginning this morning. Vomiting. Abdominal tenderness. EXAM: CHEST - 2 VIEW COMPARISON:   07/13/2021 FINDINGS: The heart size and mediastinal contours are within normal limits. Mild scarring again seen in anterior right middle lobe. The lungs are otherwise clear. The visualized skeletal structures are unremarkable. IMPRESSION: No active cardiopulmonary disease. Electronically Signed   By: Danae Orleans M.D.   On: 12/20/2022 10:50     Assessment and Plan:   Acute anterior ST elevation myocardial infarction: Emergent cardiac catheterization was done via the right radial artery which showed thrombotic occlusion of the proximal/mid LAD.  This was treated successfully with PCI and drug-eluting stent placement.  Recommend dual antiplatelet therapy for at least 12 months. Acute systolic heart failure due to post MI cardiomyopathy: EF was 40% by left ventricular angiography.  Will obtain an echocardiogram.  His LVEDP was normal and he does not require diuresis.  Recommend starting small dose carvedilol and an ARB once blood pressure and heart rate tolerate. Hyperlipidemia: I increased atorvastatin to 80 mg daily. Excessive alcohol use: This was reported by the family and the patient might require withdrawal precautions.  I anticipate the patient to be ready for discharge on Monday morning as long as he remains stable.   Risk Assessment/Risk Scores:     TIMI Risk Score for ST  Elevation MI:   The patient's TIMI risk score is 7, which indicates  a 23.4% risk of all cause mortality at 30 days.{   For questions or updates, please contact [facility] Please consult [email or web address] for contact info under    Signed, Lorine Bears, MD  12/20/2022 1:05 PM

## 2022-12-20 NOTE — Assessment & Plan Note (Signed)
Home benazepril-hydrochlorothiazide 10-12.5 mg daily, metoprolol tartrate 12.5 mg p.o. twice daily not resumed on admission, antihypertensive medication per cardiology specialist recommendations

## 2022-12-20 NOTE — Assessment & Plan Note (Addendum)
Check serum ETOH level, add to prior labs Counseling given to decrease alcohol use to every other day or once per week if able Patient and family endorses understanding and compliance at bedside CIWA precaution initiated

## 2022-12-20 NOTE — Hospital Course (Addendum)
[name] is a 72 year old male with history of hyperlipidemia, non-insulin-dependent diabetes mellitus, hypertension, hypothyroid, GERD, who presents to the emergency department for chief concerns of nonradiating left-sided chest pain.  Vitals in the ED showed temperature of 97.8, respiration rate of 20, heart rate of 69, blood pressure 147/108, SpO2 100% on room air.  Serum sodium is 139, potassium 3.6, chloride 104, bicarb 25, BUN of 14, serum creatinine of 0.89, EGFR greater than 60, nonfasting blood glucose 166, WBC 8.3, hemoglobin 14.9, platelets of 201.  High sensitive troponin was 772.  ED treatment: Aspirin 324 mg p.o., morphine 2 mg IV one-time dose, ondansetron 4 mg IV, heparin loading dose.  Code STEMI was called.  Patient was taken to the Cath Lab for cardiovascular revascularization.  Per cardiologist, In the Cath Lab, patient was found to have occluded LAD and is status post DES PCI.

## 2022-12-20 NOTE — TOC Initial Note (Signed)
Transition of Care [facility]) - Initial/Assessment Note    Patient Details  Name: Ross Riley MRN: [id number] Date of Birth: [birth year]  [facility] of [facility]) CM/SW Contact:    Colette Ribas, LCSWA Phone Number: 12/20/2022, 3:39 PM  Clinical Narrative:                    CSW received consult for SA resources. SA resources added to AVS.     Patient Goals and CMS Choice            Expected Discharge Plan and Services                                              Prior Living Arrangements/Services                       Activities of Daily Living   ADL Screening (condition at time of admission) Independently performs ADLs?: Yes (appropriate for developmental age) Is the patient deaf or have difficulty hearing?: No Does the patient have difficulty seeing, even when wearing glasses/contacts?: No Does the patient have difficulty concentrating, remembering, or making decisions?: No  Permission Sought/Granted                  Emotional Assessment              Admission diagnosis:  ST elevation myocardial infarction (STEMI), unspecified artery (HCC) [I21.3] Acute ST elevation myocardial infarction (STEMI) due to occlusion of left anterior descending (LAD) coronary artery (HCC) [I21.02] STEMI (ST elevation myocardial infarction) (HCC) [I21.3] Patient Active Problem List   Diagnosis Date Noted   Acute ST elevation myocardial infarction (STEMI) due to occlusion of left anterior descending (LAD) coronary artery (HCC) 12/20/2022   Alcohol abuse 12/20/2022   Hypokalemia 06/18/2022   Epidermal inclusion cyst 06/09/2019   Erectile dysfunction due to arterial insufficiency 09/14/2018   Abnormal ejaculation 09/14/2018   Former smoker 07/08/2017   Hypothyroidism 07/08/2017   Hx of adenomatous colonic polyps 06/09/2017   History of diverticulitis 06/09/2017   History of Clostridium difficile colitis 06/09/2017   Recurrent colitis due to  Clostridium difficile 03/25/2017   Chills (without fever) 03/25/2017   Diverticulitis 12/04/2016   Anemia 12/04/2016   Chronic right shoulder pain 11/24/2016   Other constipation 11/24/2016   BPPV (benign paroxysmal positional vertigo), bilateral 10/09/2016   Knee stiffness 10/03/2016   S/P TKR (total knee replacement), left 09/22/2016   Osteoarthritis of left knee 06/05/2016   Atypical intracranial meningioma (HCC) 05/22/2016   Abdominal pain, LLQ (left lower quadrant) 02/11/2016   Seasonal allergies 01/24/2015   BPH (benign prostatic hyperplasia) 12/06/2014   Pain in joint, ankle and foot 10/26/2014   Gonalgia 10/26/2014   Osteoarthritis of multiple joints 10/26/2014   Bilateral cataracts 10/26/2014   ED (erectile dysfunction) of organic origin 10/26/2014   Acid reflux 10/26/2014   Hyperlipidemia associated with type 2 diabetes mellitus (HCC) 10/26/2014   Essential hypertension 10/26/2014   Type 2 diabetes mellitus with diabetic cataract, without long-term current use of insulin (HCC) 08/02/2014   Focal dystonia 07/22/2012   PCP:  Smitty Cords, DO Pharmacy:   [facility] [id number] [place], [place] - [address] AT [facility] [address] [place] [address] [place] [address] Phone: [phone] Fax: [phone]  Social Determinants of Health (SDOH) Social History: SDOH Screenings   Food Insecurity: No Food Insecurity (12/20/2022)  Housing: Low Risk  (12/20/2022)  Transportation Needs: No Transportation Needs (12/20/2022)  Utilities: Not At Risk (12/20/2022)  Alcohol Screen: Low Risk  (08/07/2022)  Depression (PHQ2-9): Medium Risk (10/24/2022)  Financial Resource Strain: Low Risk  (08/07/2022)  Physical Activity: Sufficiently Active (08/07/2022)  Social Connections: Socially Isolated (08/07/2022)  Stress: No Stress Concern Present (08/07/2022)  Tobacco Use: Medium Risk (12/20/2022)   SDOH Interventions:     Readmission Risk Interventions      No data to display

## 2022-12-20 NOTE — Assessment & Plan Note (Signed)
Home dosing of latanoprost ophthalmic solution 1 drop both eyes, nightly; dorzolamide-timolol 2-0.5% ophthalmic solution 1 drop both eyes twice daily resumed on admission

## 2022-12-20 NOTE — Discharge Instructions (Signed)
**Gua de Recursos Comunitarios para Consejera Ambulatoria/Abuso de Animal nutritionist para Adultos**  El servicio "211" de [address] es una excelente fuente de informacin sobre los servicios comunitarios disponibles. Acceda marcando el 2-1-1 desde cualquier lugar de [place] o a travs de la pgina web: [www.nc211.org](http://www.nc211.org).  **Otros Recursos Locales (Actualizado 01/2016)**  **Lneas de Crisis**   **Servicios**  **rea de Servicio**   ---  ---   **[facility]**  Lnea de crisis disponible las 24 horas, los 7 das de la semana: [phone]  [place], [place]   **[facility]**  Lnea de crisis disponible las 24 horas, los 7 das de la semana: [phone]  [place], [place]   [facility]**  Lnea de prevencin de suicidio disponible las 24 horas, los 7 [address] de la semana: [phone]  [place], [place]   **[place]**  [place] de crisis disponible las 24 horas, los 7 [address]  [address] de [place]: [phone] o [phone]  Louellen [place]older, [place]   **[facility] Access to Care Line**  Lnea de crisis disponible las 24 horas, los 7 Realitos de la semana: [phone]  [employer] condados   **[facility]**  Lnea de crisis disponible las 24 horas, los 7 [address] de la semana: [phone]  [employer] condados   **Otros Recursos Locales (Actualizado 01/2016)**  **Programas de Consejera Ambulatoria/Abuso de Sustancias**   **Servicios**  **Direccin y Nmero de [place]**   ---  ---   **[facility] ([facility])**   Consejera individual, consejera grupal, programa ambulatorio intensivo (varias horas al da, varios das a la semana) <br>  Evaluaciones de depresin <br>  Programa de mantenimiento con [place]  [phone] <br> [address] <br> [place], [place] [address]   **[facility]**   Hospitalizacin parcial/tratamiento diurno y programas  para DUI/DWI <br>  [employer], seguros privados  [phone] <br> [address] <br> [place], [place] [address]   [facility]**   Acepta Medicaid, Medicare, seguros privados  [phone] <br> [address] <br> [place], [place]   **[facility]**   Servicios incluyen programa ambulatorio intensivo (varias horas al da, varios das a la semana), tratamiento ambulatorio, servicios para DUI/DWI, educacin familiar <br>  [place] ofrece algunos servicios especficamente para veteranos <br>  Ofrece vivienda transitoria  [phone] <br> [address] <br> [place], [place] [address]   [facility]**   Acepta Medicare, Philbert Riser, y seguros privados  [phone] <br> [address] <br> [place], [place] [address]   **[facility]**   Consejera individual, programa ambulatorio intensivo para abuso de sustancias (varias horas al da, [address] a la [place]), tratamiento diurno <br>  [place], [place], seguros privados  [phone] <br> [address] <br> [place], [place] [address]   **[facility]**   Programa ambulatorio intensivo para abuso de sustancias (varias horas al da, [address] a la [place]), programa de hospitalizacin parcial  [phone] <br> [address] <br> [place], [place] [address]   **[facility]**   Solo consejera individual <br>  Acepta seguros privados  [phone] <br> [address] <br> [place], [place] [address]   **[facility]**   Programa de mantenimiento con [place]  [phone] <br> [address] <br> [place], [place] [address]   **[facility]**   Clnica sin cita previa que ofrece consejera para abuso de sustancias y salud mental <br>  [place]cept[place] Medicaid, Medicare, seguros  privados <br>  Ofrece escala mvil para personas sin  seguro  [phone] <br> [address] <br> [place], [place]   **[facility] [place] [facility]   Consejera individual y servicios intensivos a domicilio  [phone] <br> [address] <br> [place], [place] [address]   **[facility] [place]**   [place] individual, familiar, terapia grupal, consejera para violencia domstica, consejera de crdito al consumidor <br>  [employer], Medicaid, seguros privados <br>  Ofrece escala mvil para personas sin seguro  [phone] <br> [address] <br> [place], [place] [address]   **[facility]**   Consejera individual, familiar y grupal <br>  3 ubicaciones: Lorenza Evangelist y [place]  [phone] <br> [address] <br> [place], [place] [address]   **[facility]**   Evaluacin psiquitrica, programa ambulatorio intensivo de 8 semanas (varias horas al da, varias veces a la semana, en el da o en la noche), grupo de recuperacin temprana, programa familiar, manejo de medicacin <br>  Pago privado o seguros privados  [phone] o [phone] <br> [address] <br> [place], [place] [address]   (Contina con ms recursos... si deseas, puedo seguir con la traduccin de la lista completa).    **Gua de Recursos Comunitarios para Tratamiento de Salud Comportamental Ingresado/Tratamiento Residencial de Abuso de Sustancias - Adultos**  El servicio "211" de [address] es una excelente fuente de informacin sobre los servicios comunitarios disponibles. Acceda marcando el 2-1-1 desde cualquier lugar de [place] o a travs de la pgina web: [www.nc211.org](http://www.nc211.org).  **(Actualizado 06/2015)**  **Asistencia en Crisis**   Disponible las 24 horas del da   **Servicios Ofrecidos**  **rea de Servicio**   ---  ---   [facility]**  Asistencia en crisis 24 horas: [phone]  [place], [place]   **[facility]**  Asistencia  en crisis 24 horas: [phone]  America Brown [place], [place]   **[facility]**  Asistencia en crisis 24 horas: [phone]  [place], [place]   [facility] Access to Care Line**  Asistencia en crisis 24 horas: [phone]  [facility] condados   **[facility]**  Lnea de Veryl Speak a crisis 24 horas: [phone]  [employer] condados   **Otros Recursos Locales (Actualizado 03/2015)**  **Programas de Tratamiento Residencial de Salud Comportamental/Abuso de Sustancias**   [facility]**  **Direccin y Nmero de [place]**   ---  ---   **[facility])**   Rehabilitacin residencial de [address]  [phone] <br> [address] <br> [place], [place]   **[facility])**   Desintoxicacin - solo pago privado <br>  Rehabilitacin residencial de [address] - solo [place], seguros, pago privado  [phone] o [phone] <br> [address] <br> [place], [place] [address]   **[facility]**   Solo seguro privado <br>  Mltiples instalaciones  [phone] (admisiones)   **[facility])**   Programa de [address] <br>  Debe estar sin hogar para participar  [phone] o [phone] <br> Marcy Panning, [place]   **[facility]**   Solo seguro privado  [phone] o [phone] <br> [address] <br> [place], [place] [address]   **[facility]**   Se requiere cita previa <br>  Acepta pago privado, Medicare, Medicaid del [place]  [phone] <br> [address] <br> [place], [place] [address]   **[facility]**   Solo para mujeres <br>  Asociado con [employer]  [phone] <br> [address] <br> [place], [place] [address]   **  [facility] [address]   Solo seguro privado  [phone] o [phone] <br> [address] <br> [place], [place] [address]   **[facility]**    Desintoxicacin <br>  Rehabilitacin residencial <br>  Solo seguro privado <br>  Mltiples ubicaciones  [phone] (admisiones)   **[facility]**   Pago privado <br>  Seguro privado  [phone] <br> [address] <br> [place], [place] [address]   **[facility]**   Solo para hombres <br>  Se requiere tarifa al momento de la admisin  [phone] <br> [address] <br> [place], [place] [address]   **[facility]**   [place] pago privado  [phone] <br> [phone] <br> [place], [place]   **[facility] ([facility])**   Desintoxicacin - pago privado, Medicaid <br>  Rehabilitacin residencial para hombres - Medicare, Medicaid, seguros, pago privado  [phone] <br> [address] <br> [place], [place]   **[facility]**   Entrevistas sin cita previa de lunes a sbado, de 8 a.m. a 4 p.m. <br>  Personas con cargos legales no son elegibles  [phone] <br> [address] <br> [place], [place] [address]   **[facility]**   Debe estar dispuesto a trabajar <br>  Debe asistir a reuniones de [place]coh[place]  [phone] <br> [address] <br> [place], [place]   **[facility]**   Programa basado en la fe <br>  Solo pago privado  [phone] <br> [address] <br> [place], [place]

## 2022-12-20 NOTE — Assessment & Plan Note (Signed)
-   Atorvastatin 80 mg daily resumed 

## 2022-12-20 NOTE — ED Provider Notes (Signed)
[facility] Provider Note    Event Date/Time   First MD Initiated Contact with Patient 12/20/22 1041     (approximate)   History   Chest Pain   HPI  Ross Riley is a 72 y.o. male with a history of CKD, hypertension, diabetes who presents with chest pain which started this morning.  EMS gave aspirin but vomited it up.  Feeling improved in triage     Physical Exam   Triage Vital Signs: ED Triage Vitals  Encounter Vitals Group     BP 12/20/22 0958 (!) 147/108     Systolic BP Percentile --      Diastolic BP Percentile --      Pulse Rate 12/20/22 0958 69     Resp 12/20/22 0958 20     Temp 12/20/22 0958 97.8 F (36.6 C)     Temp Source 12/20/22 0958 Oral     SpO2 12/20/22 0958 100 %     Weight 12/20/22 0957 78 kg (172 lb)     Height 12/20/22 0957 1.676 m (5\' 6" )     Head Circumference --      Peak Flow --      Pain Score 12/20/22 0956 10     Pain Loc --      Pain Education --      Exclude from Growth Chart --     Most recent vital signs: Vitals:   12/20/22 0958 12/20/22 1112  BP: (!) 147/108 (!) 138/104  Pulse: 69 (!) 54  Resp: 20 20  Temp: 97.8 F (36.6 C)   SpO2: 100% 99%     General: Awake, uncomfortable CV:  Good peripheral perfusion.  Regular rate and rhythm Resp:  Normal effort.  Clear to auscultation bilaterally Abd:  No distention.  Other:     ED Results / Procedures / Treatments   Labs (all labs ordered are listed, but only abnormal results are displayed) Labs Reviewed  BASIC METABOLIC PANEL - Abnormal; Notable for the following components:      Result Value   Glucose, Bld 166 (*)    Calcium 8.7 (*)    All other components within normal limits  TROPONIN I (HIGH SENSITIVITY) - Abnormal; Notable for the following components:   Troponin I (High Sensitivity) 772 (*)    All other components within normal limits  CBC  LIPASE, BLOOD  HEMOGLOBIN A1C  PROTIME-INR  APTT  LIPID PANEL  I-STAT CG4 LACTIC ACID, ED   TROPONIN I (HIGH SENSITIVITY)     EKG  ED ECG REPORT I, Jene Every, the attending physician, personally viewed and interpreted this ECG.  Date: 12/20/2022  Rhythm: normal sinus rhythm QRS Axis: normal Intervals: normal ST/T Wave abnormalities: normal Narrative Interpretation: T wave inversions, mild noted inferiorly    RADIOLOGY Chest x-ray viewed interpret by me, no acute abnormality     PROCEDURES:  Critical Care performed: yes  CRITICAL CARE Performed by: Jene Every   Total critical care time: 20 minutes  Critical care time was exclusive of separately billable procedures and treating other patients.  Critical care was necessary to treat or prevent imminent or life-threatening deterioration.  Critical care was time spent personally by me on the following activities: development of treatment plan with patient and/or surrogate as well as nursing, discussions with consultants, evaluation of patient's response to treatment, examination of patient, obtaining history from patient or surrogate, ordering and performing treatments and interventions, ordering and review of laboratory studies, ordering and  review of radiographic studies, pulse oximetry and re-evaluation of patient's condition.   Procedures   MEDICATIONS ORDERED IN ED: Medications  ondansetron (ZOFRAN) injection 4 mg ( Intravenous MAR Hold 12/20/22 1131)  morphine (PF) 4 MG/ML injection 4 mg ( Intravenous MAR Hold 12/20/22 1131)  Heparin (Porcine) in NaCl 1000-0.9 UT/500ML-% SOLN (500 mLs  Given 12/20/22 1141)  lidocaine (PF) (XYLOCAINE) 1 % injection (2 mLs  Given 12/20/22 1145)  verapamil (ISOPTIN) injection (2.5 mg Intravenous Given 12/20/22 1142)  heparin sodium (porcine) injection (4,000 Units Intravenous Given 12/20/22 1147)  fentaNYL (SUBLIMAZE) injection (50 mcg Intravenous Given 12/20/22 1155)  0.9 %  sodium chloride infusion (250 mLs Intravenous New Bag/Given 12/20/22 1155)  morphine (PF)  2 MG/ML injection 2 mg (2 mg Intravenous Given 12/20/22 1108)  ondansetron (ZOFRAN) injection 4 mg (4 mg Intravenous Given 12/20/22 1108)  aspirin chewable tablet 324 mg (324 mg Oral Given 12/20/22 1100)  heparin injection 4,000 Units (4,000 Units Intravenous Given 12/20/22 1117)     IMPRESSION / MDM / ASSESSMENT AND PLAN / ED COURSE  I reviewed the triage vital signs and the nursing notes. Patient's presentation is most consistent with acute presentation with potential threat to life or bodily function.  Patient presents with chest pain as detailed above.  Symptoms had improved however upon coming back to a room started complaining of chest pain again.  Initial EKG demonstrated some nonspecific T wave changes  Repeat EKG ordered and performed at 10:55 AM consistent with STEMI, code STEMI activated  notified of elevated troponin of 700 as well  Dr. Kirke Corin has seen the patient and is taking to the Cath Lab      FINAL CLINICAL IMPRESSION(S) / ED DIAGNOSES   Final diagnoses:  ST elevation myocardial infarction (STEMI), unspecified artery (HCC)     Rx / DC Orders   ED Discharge Orders     None        Note:  This document was prepared using Dragon voice recognition software and may include unintentional dictation errors.   Jene Every, MD 12/20/22 [phone]

## 2022-12-20 NOTE — ED Triage Notes (Signed)
Pt via [facility] from home. Pt c/o non-radiating L sided chest pain that started this AM. EMS reports abd pain on palpation. EMS gave 324 ASA but vomited shortly afterwards. Reports ETOH use daily, pt has a hx of HTN and DM. Pt is A&OX4 and NAD

## 2022-12-20 NOTE — ED Notes (Signed)
2nd IV placed, pt given medications, seems more comfortable and less nauseous at the time.  Pt was placed on 2L [place] for comfort.  Interpreting services remains online for pt understanding/facilitate communication.

## 2022-12-20 NOTE — Assessment & Plan Note (Signed)
Home levothyroxine 75 mcg daily resumed

## 2022-12-20 NOTE — Assessment & Plan Note (Signed)
Patient is status post DES PCI to the LAD per STEMI cardiologist. Continue dual antiplatelet therapy per cardiology recommendation Continue high intensity statin medication, atorvastatin 80 mg daily Admit to stepdown, inpatient

## 2022-12-20 NOTE — Progress Notes (Signed)
Pt assessment completed with interpretor. A&OX4. 3/10 chest pain since receiving pt from cath lab. Pt refuses tylenol at this time. TR band removed at 1500, level zero, barbeau performed and level A. RUE elevated above heart and pt educated on post procedure care. VSS. No issues voiding. Tolerating edge of bed and heart healthy diet. Will continue to monitor.

## 2022-12-20 NOTE — Progress Notes (Signed)
  Echocardiogram 2D Echocardiogram has been performed.  Ross Riley 12/20/2022, 2:24 PM

## 2022-12-21 DIAGNOSIS — I255 Ischemic cardiomyopathy: Secondary | ICD-10-CM

## 2022-12-21 DIAGNOSIS — I2102 ST elevation (STEMI) myocardial infarction involving left anterior descending coronary artery: Secondary | ICD-10-CM

## 2022-12-21 LAB — HEPATIC FUNCTION PANEL
ALT: 39 U/L (ref 0–44)
AST: 163 U/L — ABNORMAL HIGH (ref 15–41)
Albumin: 3.2 g/dL — ABNORMAL LOW (ref 3.5–5.0)
Alkaline Phosphatase: 44 U/L (ref 38–126)
Bilirubin, Direct: <0.1 mg/dL (ref 0.0–0.2)
Total Bilirubin: 0.8 mg/dL (ref 0.3–1.2)
Total Protein: 5.9 g/dL — ABNORMAL LOW (ref 6.5–8.1)

## 2022-12-21 LAB — BASIC METABOLIC PANEL
Anion gap: 9 (ref 5–15)
BUN: 17 mg/dL (ref 8–23)
CO2: 23 mmol/L (ref 22–32)
Calcium: 7.8 mg/dL — ABNORMAL LOW (ref 8.9–10.3)
Chloride: 103 mmol/L (ref 98–111)
Creatinine, Ser: 0.8 mg/dL (ref 0.61–1.24)
GFR, Estimated: >60 mL/min (ref >60)
Glucose, Bld: 133 mg/dL — ABNORMAL HIGH (ref 70–99)
Potassium: 3.2 mmol/L — ABNORMAL LOW (ref 3.5–5.1)
Sodium: 135 mmol/L (ref 135–145)

## 2022-12-21 LAB — CBC
HCT: 37.2 % — ABNORMAL LOW (ref 39.0–52.0)
Hemoglobin: 12.5 g/dL — ABNORMAL LOW (ref 13.0–17.0)
MCH: 29.8 pg (ref 26.0–34.0)
MCHC: 33.6 g/dL (ref 30.0–36.0)
MCV: 88.6 fL (ref 80.0–100.0)
Platelets: 158 10*3/uL (ref 150–400)
RBC: 4.2 MIL/uL — ABNORMAL LOW (ref 4.22–5.81)
RDW: 12.6 % (ref 11.5–15.5)
WBC: 8.5 10*3/uL (ref 4.0–10.5)
nRBC: 0 % (ref 0.0–0.2)

## 2022-12-21 LAB — GLUCOSE, CAPILLARY
Glucose-Capillary: 148 mg/dL — ABNORMAL HIGH (ref 70–99)
Glucose-Capillary: 168 mg/dL — ABNORMAL HIGH (ref 70–99)
Glucose-Capillary: 200 mg/dL — ABNORMAL HIGH (ref 70–99)
Glucose-Capillary: 153 mg/dL — ABNORMAL HIGH (ref 70–99)

## 2022-12-21 LAB — PHOSPHORUS: Phosphorus: 3.4 mg/dL (ref 2.5–4.6)

## 2022-12-21 LAB — MAGNESIUM: Magnesium: 2 mg/dL (ref 1.7–2.4)

## 2022-12-21 MED ORDER — SODIUM CHLORIDE 0.9 % IV BOLUS
1000.0000 mL | Freq: Once | INTRAVENOUS | Status: AC
  Administered 2022-12-21: 1000 mL via INTRAVENOUS

## 2022-12-21 MED ORDER — KETOROLAC TROMETHAMINE 15 MG/ML IJ SOLN
15.0000 mg | Freq: Four times a day (QID) | INTRAMUSCULAR | Status: DC | PRN
  Administered 2022-12-21: 15 mg via INTRAVENOUS
  Filled 2022-12-21: qty 1

## 2022-12-21 MED ORDER — METOPROLOL SUCCINATE ER 25 MG PO TB24
12.5000 mg | ORAL_TABLET | Freq: Every day | ORAL | Status: DC
Start: 2022-12-21 — End: 2022-12-22
  Administered 2022-12-22: 12.5 mg via ORAL
  Filled 2022-12-21: qty 0.5
  Filled 2022-12-21: qty 1

## 2022-12-21 MED ORDER — SODIUM CHLORIDE 0.9 % IV BOLUS
500.0000 mL | Freq: Once | INTRAVENOUS | Status: AC
  Administered 2022-12-21: 500 mL via INTRAVENOUS

## 2022-12-21 MED ORDER — POTASSIUM CHLORIDE CRYS ER 20 MEQ PO TBCR
40.0000 meq | EXTENDED_RELEASE_TABLET | ORAL | Status: AC
Start: 2022-12-21 — End: 2022-12-21
  Administered 2022-12-21 (×2): 40 meq via ORAL
  Filled 2022-12-21 (×2): qty 2

## 2022-12-21 NOTE — Progress Notes (Signed)
   Patient Name: Ross Riley Date of Encounter: 12/21/2022 [facility] Cardiologist: None   Interval Summary  .    Patient seen on a.m. rounds.  Has continued complaint of chest pain off and on since his procedure.  Patient is status post anterior ST elevated myocardial infarction with successful angioplasty and DES to the LAD. Patient asking for pain medication but declines use of Tylenol.  Has received Nitropaste for continued chest discomfort after breakfast this morning.  Was also given PPI.  No EKG or telemetry changes have been noted during these episodes.  Vital Signs .    Vitals:   12/21/22 0600 12/21/22 0603 12/21/22 0621 12/21/22 0630  BP: (!) 72/48 (!) 90/57  98/78  Pulse: (!) 58 (!) 57 (!) 51 63  Resp: (!) 28  (!) 23 (!) 31  Temp:      TempSrc:      SpO2: 99%  98% 98%  Weight:      Height:        Intake/Output Summary (Last 24 hours) at 12/21/2022 0759 Last data filed at 12/21/2022 0609 Gross per 24 hour  Intake 1779.06 ml  Output 1275 ml  Net 504.06 ml      12/20/2022   12:33 PM 12/20/2022    9:57 AM 10/24/2022    2:21 PM  Last 3 Weights  Weight (lbs) 178 lb 12.7 oz 172 lb 173 lb  Weight (kg) 81.1 kg 78.019 kg 78.472 kg      Telemetry/ECG    Sinus bradycardia to sinus rhythm rate of 50-60- Personally Reviewed  Physical Exam .   GEN: No acute distress.   Neck: No JVD Cardiac: RRR, no murmurs, rubs, or gallops.  Right radial cath site with 2+ pulse, gauze and OpSite dressing intact, no bleeding, bruising, or hematoma noted at site Respiratory: Clear to auscultation bilaterally.  Respirations are unlabored at rest on room air GI: Soft, nontender, obese, non-distended  MS: No edema  Assessment & Plan .     Acute anterior ST elevation myocardial infarction -High-sensitivity troponins greater than 24,000 -Status post successful PCI/DES to the LAD -Recommend dual antiplatelet therapy for minimum of 12 months with aspirin and  Brilinta -Continue high intensity statin therapy -Started on beta-blocker therapy this morning -Continues to have chest discomfort and has Nitropaste without EKG changes concerning whether it is chest discomfort or sign of withdrawal -Continue with telemetry monitoring -EKG as needed for pain or changes -Cardiac rehab on discharge -Increase activity as tolerated with out of the bed to the chair  Acute systolic heart failure/ICM status post MI -EF 40% by LV gram during procedure -Echocardiogram ordered and pending with further recommendations to follow -Started on low-dose Toprol-XL this morning for antianginal benefit -Will continue to escalate GDMT with adding ARB once allowed by blood pressure -Does not require diuretic therapy is euvolemic on exam -Heart failure education -Daily weights, I's and O's, low-sodium diet  Hypokalemia -Serum potassium 3.2 -Potassium supplements ordered -Recommend keeping potassium closer to 4 less than 5 -Monitor/trend/replete electrolytes as needed -Daily BMP  Hyperlipidemia -Continued on atorvastatin 80 mg daily -LDL 90 goal ideally less than 55 -LP(a) pending  Excessive alcohol use -Continue to monitor for withdrawals -Family reported patient might require withdrawal precautions and due to his excessive alcohol use -Continued management per IM  For questions or updates, please contact [facility] Please consult [email or web address] for contact info under        Signed, Meleane Selinger, NP

## 2022-12-21 NOTE — Plan of Care (Signed)
  Problem: Clinical Measurements: Goal: Ability to maintain clinical measurements within normal limits will improve Outcome: Progressing   Problem: Clinical Measurements: Goal: Respiratory complications will improve Outcome: Progressing   Problem: Clinical Measurements: Goal: Cardiovascular complication will be avoided Outcome: Progressing   Problem: Education: Goal: Knowledge of General Education information will improve Description: Including pain rating scale, medication(s)/side effects and non-pharmacologic comfort measures Outcome: Progressing

## 2022-12-21 NOTE — Progress Notes (Addendum)
Comment: (NOTE) The GeneXpert MRSA Assay (FDA approved for NASAL specimens only), is one component of a comprehensive MRSA colonization surveillance program. It is not intended to diagnose MRSA infection nor to guide or monitor treatment for MRSA infections. Test performance is not FDA approved in patients less than 71 years old. Performed at [facility], [address] [place], [place] [address]     Procedures and diagnostic studies:  ECHOCARDIOGRAM COMPLETE  Result Date: 12/20/2022    ECHOCARDIOGRAM REPORT   Patient Name:   Ross Riley Date of Exam: 12/20/2022 Medical Rec #:  [id number]        Height:       66.0 in Accession #:    [id number]       Weight:       178.8 lb Date of Birth:  [birth year]         BSA:          1.907 m Patient Age:    72 years         BP:           100/75 mmHg Patient Gender: M                HR:           57 bpm. Exam Location:  [facility] Procedure: 2D Echo Indications:     AMI I21.9  History:         Patient has no prior history of Echocardiogram examinations.  Sonographer:     Overton Mam RDCS, FASE Referring Phys:  [id number] ONGEXBMW A ARIDA Diagnosing Phys: Debbe Odea MD IMPRESSIONS  1. Left ventricular ejection fraction, by estimation, is 35 to 40%. The left ventricle has moderately decreased function. The left ventricle demonstrates regional wall motion abnormalities (see scoring diagram/findings for description). There is mild left ventricular hypertrophy. Left ventricular diastolic parameters are indeterminate. There is akinesis of the left ventricular, entire anteroseptal wall and apical segment.  2. Right ventricular systolic function is normal.  The right ventricular size is normal.  3. The mitral valve is normal in structure. No evidence of mitral valve regurgitation.  4. The aortic valve is grossly normal. Aortic valve regurgitation is mild. FINDINGS  Left Ventricle: Left ventricular ejection fraction, by estimation, is 35 to 40%. The left ventricle has moderately decreased function. The left ventricle demonstrates regional wall motion abnormalities. The left ventricular internal cavity size was normal in size. There is mild left ventricular hypertrophy. Left ventricular diastolic parameters are indeterminate. Right Ventricle: The right ventricular size is normal. No increase in right ventricular wall thickness. Right ventricular systolic function is normal. Left Atrium: Left atrial size was normal in size. Right Atrium: Right atrial size was normal in size. Pericardium: There is no evidence of pericardial effusion. Mitral Valve: The mitral valve is normal in structure. No evidence of mitral valve regurgitation. Tricuspid Valve: The tricuspid valve is normal in structure. Tricuspid valve regurgitation is mild. Aortic Valve: The aortic valve is grossly normal. Aortic valve regurgitation is mild. Aortic regurgitation PHT measures 517 msec. Aortic valve peak gradient measures 6.2 mmHg. Pulmonic Valve: The pulmonic valve was not well visualized. Pulmonic valve regurgitation is not visualized. Aorta: The aortic root and ascending aorta are structurally normal, with no evidence of dilitation. Venous: The inferior vena cava was not well visualized. IAS/Shunts: No atrial level shunt detected by color flow Doppler.  LEFT VENTRICLE PLAX 2D LVIDd:         4.90 cm  Progress Note    Ross Riley  VZD:[id number] DOB: 08/26/1950  DOA: 12/20/2022 PCP: Smitty Cords, DO      Brief Narrative:    Medical records reviewed and are as summarized below:  Ross Riley is a 72 y.o. male with history of hyperlipidemia, non-insulin-dependent diabetes mellitus, hypertension, hypothyroid, GERD, alcohol use, who presented to the hospital with chest pain.  Initial high-sensitivity troponin was 772.  He was found to have acute ST elevation MI.  He underwent emergent left heart catheterization and stent was placed to LAD for occluded LAD.        Assessment/Plan:   Principal Problem:   Acute ST elevation myocardial infarction (STEMI) due to occlusion of left anterior descending (LAD) coronary artery (HCC) Active Problems:   Type 2 diabetes mellitus with diabetic cataract, without long-term current use of insulin (HCC)   Bilateral cataracts   Hyperlipidemia associated with type 2 diabetes mellitus (HCC)   Essential hypertension   BPH (benign prostatic hyperplasia)   S/P TKR (total knee replacement), left   Hypothyroidism   Alcohol abuse    Body mass index is 28.86 kg/m.   Acute ST elevation MI: S/p left LAD coronary stent on 12/20/2022.  Continue aspirin, Brilinta, high-dose statin, Toprol-XL.  Follow-up with cardiologist   Ischemic cardiomyopathy: 2D echo showed EF estimated at 35 to 40%.  Continue Toprol-XL   Hypokalemia: Replete potassium and monitor levels   Type II DM: Continue NovoLog as needed for hyperglycemia.  Home glipizide is on hold   Hyperlipidemia: Continue high-dose Lipitor   Other comorbidities include alcohol use, hypertension, hypothyroidism     Diet Order             Diet Heart Room service appropriate? Yes; Fluid consistency: Thin  Diet effective now                            Consultants: Cardiologist  Procedures: Left heart cath with LAD stent    Medications:     aspirin  81 mg Oral Daily   atorvastatin  80 mg Oral Daily   dorzolamide-timolol  1 drop Both Eyes BID   enoxaparin (LOVENOX) injection  40 mg Subcutaneous Q24H   folic acid  1 mg Oral Daily   gabapentin  300 mg Oral BID   influenza vaccine adjuvanted  0.5 mL Intramuscular Tomorrow-1000   insulin aspart  0-15 Units Subcutaneous TID WC   insulin aspart  0-5 Units Subcutaneous QHS   latanoprost  1 drop Both Eyes QHS   levothyroxine  75 mcg Oral Q0600   metoprolol succinate  12.5 mg Oral Daily   multivitamin with minerals  1 tablet Oral Daily   pantoprazole  40 mg Oral Daily   potassium chloride  40 mEq Oral Q4H   sodium chloride flush  3 mL Intravenous Q12H   thiamine  100 mg Oral Daily   Or   thiamine  100 mg Intravenous Daily   ticagrelor  90 mg Oral BID   Continuous Infusions:  sodium chloride       Anti-infectives (From admission, onward)    None              Family Communication/Anticipated D/C date and plan/Code Status   DVT prophylaxis: enoxaparin (LOVENOX) injection 40 mg Start: 12/21/22 0800 Place TED hose Start: 12/20/22 1404     Code Status: Full Code  Family Communication: None Disposition Plan: Plan to discharge  Progress Note    Ross Riley  VZD:[id number] DOB: 08/26/1950  DOA: 12/20/2022 PCP: Smitty Cords, DO      Brief Narrative:    Medical records reviewed and are as summarized below:  Ross Riley is a 72 y.o. male with history of hyperlipidemia, non-insulin-dependent diabetes mellitus, hypertension, hypothyroid, GERD, alcohol use, who presented to the hospital with chest pain.  Initial high-sensitivity troponin was 772.  He was found to have acute ST elevation MI.  He underwent emergent left heart catheterization and stent was placed to LAD for occluded LAD.        Assessment/Plan:   Principal Problem:   Acute ST elevation myocardial infarction (STEMI) due to occlusion of left anterior descending (LAD) coronary artery (HCC) Active Problems:   Type 2 diabetes mellitus with diabetic cataract, without long-term current use of insulin (HCC)   Bilateral cataracts   Hyperlipidemia associated with type 2 diabetes mellitus (HCC)   Essential hypertension   BPH (benign prostatic hyperplasia)   S/P TKR (total knee replacement), left   Hypothyroidism   Alcohol abuse    Body mass index is 28.86 kg/m.   Acute ST elevation MI: S/p left LAD coronary stent on 12/20/2022.  Continue aspirin, Brilinta, high-dose statin, Toprol-XL.  Follow-up with cardiologist   Ischemic cardiomyopathy: 2D echo showed EF estimated at 35 to 40%.  Continue Toprol-XL   Hypokalemia: Replete potassium and monitor levels   Type II DM: Continue NovoLog as needed for hyperglycemia.  Home glipizide is on hold   Hyperlipidemia: Continue high-dose Lipitor   Other comorbidities include alcohol use, hypertension, hypothyroidism     Diet Order             Diet Heart Room service appropriate? Yes; Fluid consistency: Thin  Diet effective now                            Consultants: Cardiologist  Procedures: Left heart cath with LAD stent    Medications:     aspirin  81 mg Oral Daily   atorvastatin  80 mg Oral Daily   dorzolamide-timolol  1 drop Both Eyes BID   enoxaparin (LOVENOX) injection  40 mg Subcutaneous Q24H   folic acid  1 mg Oral Daily   gabapentin  300 mg Oral BID   influenza vaccine adjuvanted  0.5 mL Intramuscular Tomorrow-1000   insulin aspart  0-15 Units Subcutaneous TID WC   insulin aspart  0-5 Units Subcutaneous QHS   latanoprost  1 drop Both Eyes QHS   levothyroxine  75 mcg Oral Q0600   metoprolol succinate  12.5 mg Oral Daily   multivitamin with minerals  1 tablet Oral Daily   pantoprazole  40 mg Oral Daily   potassium chloride  40 mEq Oral Q4H   sodium chloride flush  3 mL Intravenous Q12H   thiamine  100 mg Oral Daily   Or   thiamine  100 mg Intravenous Daily   ticagrelor  90 mg Oral BID   Continuous Infusions:  sodium chloride       Anti-infectives (From admission, onward)    None              Family Communication/Anticipated D/C date and plan/Code Status   DVT prophylaxis: enoxaparin (LOVENOX) injection 40 mg Start: 12/21/22 0800 Place TED hose Start: 12/20/22 1404     Code Status: Full Code  Family Communication: None Disposition Plan: Plan to discharge  Comment: (NOTE) The GeneXpert MRSA Assay (FDA approved for NASAL specimens only), is one component of a comprehensive MRSA colonization surveillance program. It is not intended to diagnose MRSA infection nor to guide or monitor treatment for MRSA infections. Test performance is not FDA approved in patients less than 71 years old. Performed at [facility], [address] [place], [place] [address]     Procedures and diagnostic studies:  ECHOCARDIOGRAM COMPLETE  Result Date: 12/20/2022    ECHOCARDIOGRAM REPORT   Patient Name:   Ross Riley Date of Exam: 12/20/2022 Medical Rec #:  [id number]        Height:       66.0 in Accession #:    [id number]       Weight:       178.8 lb Date of Birth:  [birth year]         BSA:          1.907 m Patient Age:    72 years         BP:           100/75 mmHg Patient Gender: M                HR:           57 bpm. Exam Location:  [facility] Procedure: 2D Echo Indications:     AMI I21.9  History:         Patient has no prior history of Echocardiogram examinations.  Sonographer:     Overton Mam RDCS, FASE Referring Phys:  [id number] ONGEXBMW A ARIDA Diagnosing Phys: Debbe Odea MD IMPRESSIONS  1. Left ventricular ejection fraction, by estimation, is 35 to 40%. The left ventricle has moderately decreased function. The left ventricle demonstrates regional wall motion abnormalities (see scoring diagram/findings for description). There is mild left ventricular hypertrophy. Left ventricular diastolic parameters are indeterminate. There is akinesis of the left ventricular, entire anteroseptal wall and apical segment.  2. Right ventricular systolic function is normal.  The right ventricular size is normal.  3. The mitral valve is normal in structure. No evidence of mitral valve regurgitation.  4. The aortic valve is grossly normal. Aortic valve regurgitation is mild. FINDINGS  Left Ventricle: Left ventricular ejection fraction, by estimation, is 35 to 40%. The left ventricle has moderately decreased function. The left ventricle demonstrates regional wall motion abnormalities. The left ventricular internal cavity size was normal in size. There is mild left ventricular hypertrophy. Left ventricular diastolic parameters are indeterminate. Right Ventricle: The right ventricular size is normal. No increase in right ventricular wall thickness. Right ventricular systolic function is normal. Left Atrium: Left atrial size was normal in size. Right Atrium: Right atrial size was normal in size. Pericardium: There is no evidence of pericardial effusion. Mitral Valve: The mitral valve is normal in structure. No evidence of mitral valve regurgitation. Tricuspid Valve: The tricuspid valve is normal in structure. Tricuspid valve regurgitation is mild. Aortic Valve: The aortic valve is grossly normal. Aortic valve regurgitation is mild. Aortic regurgitation PHT measures 517 msec. Aortic valve peak gradient measures 6.2 mmHg. Pulmonic Valve: The pulmonic valve was not well visualized. Pulmonic valve regurgitation is not visualized. Aorta: The aortic root and ascending aorta are structurally normal, with no evidence of dilitation. Venous: The inferior vena cava was not well visualized. IAS/Shunts: No atrial level shunt detected by color flow Doppler.  LEFT VENTRICLE PLAX 2D LVIDd:         4.90 cm  Comment: (NOTE) The GeneXpert MRSA Assay (FDA approved for NASAL specimens only), is one component of a comprehensive MRSA colonization surveillance program. It is not intended to diagnose MRSA infection nor to guide or monitor treatment for MRSA infections. Test performance is not FDA approved in patients less than 71 years old. Performed at [facility], [address] [place], [place] [address]     Procedures and diagnostic studies:  ECHOCARDIOGRAM COMPLETE  Result Date: 12/20/2022    ECHOCARDIOGRAM REPORT   Patient Name:   Ross Riley Date of Exam: 12/20/2022 Medical Rec #:  [id number]        Height:       66.0 in Accession #:    [id number]       Weight:       178.8 lb Date of Birth:  [birth year]         BSA:          1.907 m Patient Age:    72 years         BP:           100/75 mmHg Patient Gender: M                HR:           57 bpm. Exam Location:  [facility] Procedure: 2D Echo Indications:     AMI I21.9  History:         Patient has no prior history of Echocardiogram examinations.  Sonographer:     Overton Mam RDCS, FASE Referring Phys:  [id number] ONGEXBMW A ARIDA Diagnosing Phys: Debbe Odea MD IMPRESSIONS  1. Left ventricular ejection fraction, by estimation, is 35 to 40%. The left ventricle has moderately decreased function. The left ventricle demonstrates regional wall motion abnormalities (see scoring diagram/findings for description). There is mild left ventricular hypertrophy. Left ventricular diastolic parameters are indeterminate. There is akinesis of the left ventricular, entire anteroseptal wall and apical segment.  2. Right ventricular systolic function is normal.  The right ventricular size is normal.  3. The mitral valve is normal in structure. No evidence of mitral valve regurgitation.  4. The aortic valve is grossly normal. Aortic valve regurgitation is mild. FINDINGS  Left Ventricle: Left ventricular ejection fraction, by estimation, is 35 to 40%. The left ventricle has moderately decreased function. The left ventricle demonstrates regional wall motion abnormalities. The left ventricular internal cavity size was normal in size. There is mild left ventricular hypertrophy. Left ventricular diastolic parameters are indeterminate. Right Ventricle: The right ventricular size is normal. No increase in right ventricular wall thickness. Right ventricular systolic function is normal. Left Atrium: Left atrial size was normal in size. Right Atrium: Right atrial size was normal in size. Pericardium: There is no evidence of pericardial effusion. Mitral Valve: The mitral valve is normal in structure. No evidence of mitral valve regurgitation. Tricuspid Valve: The tricuspid valve is normal in structure. Tricuspid valve regurgitation is mild. Aortic Valve: The aortic valve is grossly normal. Aortic valve regurgitation is mild. Aortic regurgitation PHT measures 517 msec. Aortic valve peak gradient measures 6.2 mmHg. Pulmonic Valve: The pulmonic valve was not well visualized. Pulmonic valve regurgitation is not visualized. Aorta: The aortic root and ascending aorta are structurally normal, with no evidence of dilitation. Venous: The inferior vena cava was not well visualized. IAS/Shunts: No atrial level shunt detected by color flow Doppler.  LEFT VENTRICLE PLAX 2D LVIDd:         4.90 cm

## 2022-12-22 ENCOUNTER — Encounter: Payer: Self-pay | Admitting: Cardiovascular Disease

## 2022-12-22 ENCOUNTER — Other Ambulatory Visit (HOSPITAL_COMMUNITY): Payer: Self-pay

## 2022-12-22 ENCOUNTER — Other Ambulatory Visit: Payer: Self-pay

## 2022-12-22 DIAGNOSIS — I2102 ST elevation (STEMI) myocardial infarction involving left anterior descending coronary artery: Secondary | ICD-10-CM | POA: Diagnosis not present

## 2022-12-22 LAB — BASIC METABOLIC PANEL
Anion gap: 7 (ref 5–15)
BUN: 16 mg/dL (ref 8–23)
CO2: 24 mmol/L (ref 22–32)
Calcium: 8.2 mg/dL — ABNORMAL LOW (ref 8.9–10.3)
Chloride: 107 mmol/L (ref 98–111)
Creatinine, Ser: 0.92 mg/dL (ref 0.61–1.24)
GFR, Estimated: >60 mL/min (ref >60)
Glucose, Bld: 125 mg/dL — ABNORMAL HIGH (ref 70–99)
Potassium: 3.9 mmol/L (ref 3.5–5.1)
Sodium: 138 mmol/L (ref 135–145)

## 2022-12-22 LAB — GLUCOSE, CAPILLARY
Glucose-Capillary: 124 mg/dL — ABNORMAL HIGH (ref 70–99)
Glucose-Capillary: 134 mg/dL — ABNORMAL HIGH (ref 70–99)
Glucose-Capillary: 144 mg/dL — ABNORMAL HIGH (ref 70–99)

## 2022-12-22 MED ORDER — ASPIRIN 81 MG PO CHEW
81.0000 mg | CHEWABLE_TABLET | Freq: Every day | ORAL | 2 refills | Status: DC
  Filled 2022-12-22: qty 30, 30d supply, fill #0

## 2022-12-22 MED ORDER — ATORVASTATIN CALCIUM 80 MG PO TABS
80.0000 mg | ORAL_TABLET | Freq: Every day | ORAL | 2 refills | Status: DC
  Filled 2022-12-22: qty 30, 30d supply, fill #0

## 2022-12-22 MED ORDER — EMPAGLIFLOZIN 10 MG PO TABS
10.0000 mg | ORAL_TABLET | Freq: Every day | ORAL | Status: DC
  Administered 2022-12-22: 10 mg via ORAL
  Filled 2022-12-22: qty 1

## 2022-12-22 MED ORDER — FOLIC ACID 1 MG PO TABS
1.0000 mg | ORAL_TABLET | Freq: Every day | ORAL | 2 refills | Status: AC
  Filled 2022-12-22: qty 30, 30d supply, fill #0

## 2022-12-22 MED ORDER — TICAGRELOR 90 MG PO TABS
90.0000 mg | ORAL_TABLET | Freq: Two times a day (BID) | ORAL | 2 refills | Status: DC
  Filled 2022-12-22: qty 60, 30d supply, fill #0

## 2022-12-22 MED ORDER — EMPAGLIFLOZIN 10 MG PO TABS
10.0000 mg | ORAL_TABLET | Freq: Every day | ORAL | 2 refills | Status: DC
  Filled 2022-12-22: qty 30, 30d supply, fill #0

## 2022-12-22 MED ORDER — ADULT MULTIVITAMIN W/MINERALS CH
1.0000 | ORAL_TABLET | Freq: Every day | ORAL | 2 refills | Status: AC
  Filled 2022-12-22: qty 30, 30d supply, fill #0

## 2022-12-22 MED ORDER — CARVEDILOL 3.125 MG PO TABS
3.1250 mg | ORAL_TABLET | Freq: Two times a day (BID) | ORAL | 2 refills | Status: DC
  Filled 2022-12-22: qty 60, 30d supply, fill #0

## 2022-12-22 MED ORDER — LOSARTAN POTASSIUM 25 MG PO TABS
25.0000 mg | ORAL_TABLET | Freq: Every day | ORAL | 2 refills | Status: DC
  Filled 2022-12-22: qty 30, 30d supply, fill #0

## 2022-12-22 MED ORDER — CARVEDILOL 3.125 MG PO TABS: 3.1250 mg | ORAL_TABLET | Freq: Two times a day (BID) | ORAL | Status: DC

## 2022-12-22 MED ORDER — LOSARTAN POTASSIUM 25 MG PO TABS
25.0000 mg | ORAL_TABLET | Freq: Every day | ORAL | Status: DC
  Administered 2022-12-22: 25 mg via ORAL
  Filled 2022-12-22: qty 1

## 2022-12-22 NOTE — Progress Notes (Signed)
   Patient Name: Ross Riley Date of Encounter: 12/22/2022 [facility] Cardiologist: None   Interval Summary  .    He is feeling significantly better.  He does complain of being fatigued.  He reports sharp chest discomfort when he takes a deep breath but mild overall.  Vital Signs .    Vitals:   12/21/22 2007 12/22/22 0053 12/22/22 0438 12/22/22 0813  BP: 125/74 (!) 127/94 137/89 132/88  Pulse: 63 63 71 60  Resp: 20 16 15 20   Temp: 99.8 F (37.7 C) 97.8 F (36.6 C) 98.4 F (36.9 C) 98.9 F (37.2 C)  TempSrc: Oral   Oral  SpO2: 100% 99% 98% 99%  Weight:      Height:        Intake/Output Summary (Last 24 hours) at 12/22/2022 0906 Last data filed at 12/21/2022 2240 Gross per 24 hour  Intake 3 ml  Output --  Net 3 ml      12/20/2022   12:33 PM 12/20/2022    9:57 AM 10/24/2022    2:21 PM  Last 3 Weights  Weight (lbs) 178 lb 12.7 oz 172 lb 173 lb  Weight (kg) 81.1 kg 78.019 kg 78.472 kg      Telemetry/ECG    Sinus bradycardia to sinus rhythm rate of 50-60- Personally Reviewed  Physical Exam .   GEN: No acute distress.   Neck: No JVD Cardiac: RRR, no murmurs, rubs, or gallops.  Right radial cath site with 2+ pulse, gauze and OpSite dressing intact, no bleeding, bruising, or hematoma noted at site Respiratory: Clear to auscultation bilaterally.  Respirations are unlabored at rest on room air GI: Soft, nontender, obese, non-distended  MS: No edema Right radial pulses normal with no hematoma.  Assessment & Plan .     Acute anterior ST elevation myocardial infarction -High-sensitivity troponins greater than 24,000 -Status post successful PCI/DES to the proximal LAD -Recommend dual antiplatelet therapy for minimum of 12 months with aspirin and Brilinta -Continue high intensity statin therapy -I switch Toprol to carvedilol 3.125 mg twice daily. The patient was referred to cardiac rehab. He has pleuritic chest pain likely due to mild pericarditis but  not significant enough to require treatment at this time.  Acute systolic heart failure/ICM status post MI -Moderate reduced ejection fraction at 35 to 40%.  LVEDP was normal.  He appears to be euvolemic. -I switch Toprol to carvedilol and added losartan 25 mg once daily. I suspect significant recovery of LV systolic function given quick revascularization.   Hyperlipidemia -Continued on atorvastatin 80 mg daily -LDL 90 goal ideally less than 55 -LP(a) pending  Excessive alcohol use -No signs of withdrawal.   The patient can be discharged home from a cardiac standpoint on current medications. I will arrange for follow-up in our office in 1 week.  For questions or updates, please contact [facility] Please consult [email or web address] for contact info under        Signed, Lorine Bears, MD

## 2022-12-22 NOTE — Consult Note (Signed)
[facility] Customer service manager [facility]) Accountable Care Organization (ACO) [facility] Liaison Note  12/22/2022  Ross Riley 12/11/1950 [id number]  Location: [facility] RN Hospital Liaison screened the patient remotely at [facility].  Insurance: [employer]   Ross Riley is a 72 y.o. male who is a Primary Care Patient of Smitty Cords, DO [facility]. The patient was screened for readmission hospitalization with noted low risk score for unplanned readmission risk with 1 IP in 6 months.  The patient was assessed for potential [facility]) Care Management service needs for post hospital transition for care coordination. Review of patient's electronic medical record reveals patient for Acute (STEMI). Pt will discharged home with no anticipated needs.    Plan: [facility] Liaison will continue to follow progress and disposition to asess for post hospital community care coordination/management needs.  Referral request for community care coordination: anticipate [facility] Transitions of Care Team follow up.   [facility] Care Management/Population Health does not replace or interfere with any arrangements made by the Inpatient Transition of Care team.   For questions contact:   Elliot Cousin, RN, [facility] Liaison [place]   [facility], Population Health Office Hours MTWF  8:00 am-6:00 pm Direct Dial: [phone] mobile [phone] [Office toll free line] Office Hours are M-F 8:30 - 5 pm [email or web address]

## 2022-12-22 NOTE — Progress Notes (Signed)
Heart Failure Stewardship Pharmacy Note  PCP: Smitty Cords, DO PCP-Cardiologist: None  HPI: Ross Riley is a 72 y.o. male with hyperlipidemia, non-insulin-dependent diabetes mellitus, hypertension, hypothyroid, GERD who presented with non-radiating left sided chest pain. Troponin on admission was 772 and peaked at >24,000. Coronary CT on 11/19/2021 did not show flow-limiting stenosis. Patient diagnosed with acute anterior MI with 100% proximal-mid LAD stenosis treated with DES. TTE showed LVEF of 35-40% with mild TR.  Pertinent Lab Values: Creat  Date Value Ref Range Status  11/07/2021 0.99 0.70 - 1.28 mg/dL Final   Creatinine, Ser  Date Value Ref Range Status  12/22/2022 0.92 0.61 - 1.24 mg/dL Final   BUN  Date Value Ref Range Status  12/22/2022 16 8 - 23 mg/dL Final  59/56/3875 13 8 - 27 mg/dL Final  64/33/2951 11 7 - 18 mg/dL Final   Potassium  Date Value Ref Range Status  12/22/2022 3.9 3.5 - 5.1 mmol/L Final  12/07/2012 3.3 (L) 3.5 - 5.1 mmol/L Final   Sodium  Date Value Ref Range Status  12/22/2022 138 135 - 145 mmol/L Final  06/19/2015 142 134 - 144 mmol/L Final  12/07/2012 139 136 - 145 mmol/L Final   Magnesium  Date Value Ref Range Status  12/21/2022 2.0 1.7 - 2.4 mg/dL Final    Comment:    Performed at [facility], [address] [place], [place] [address]   Hemoglobin A1C  Date Value Ref Range Status  10/02/2022 7.3  Final   Hgb A1c MFr Bld  Date Value Ref Range Status  12/20/2022 7.0 (H) 4.8 - 5.6 % Final    Comment:    (NOTE) Pre diabetes:          5.7%-6.4%  Diabetes:              >6.4%  Glycemic control for   <7.0% adults with diabetes    TSH  Date Value Ref Range Status  11/09/2019 3.85 0.40 - 4.50 mIU/L Final    Vital Signs: Admission weight: Temp:  [97.8 F (36.6 C)-99.8 F (37.7 C)] 98.9 F (37.2 C) (10/21 0813) Pulse Rate:  [55-71] 60 (10/21 0813) Cardiac Rhythm: Sinus bradycardia (10/21 0700) Resp:   [11-40] 20 (10/21 0813) BP: (95-137)/(57-117) 132/88 (10/21 0813) SpO2:  [94 %-100 %] 99 % (10/21 0813)  Intake/Output Summary (Last 24 hours) at 12/22/2022 0957 Last data filed at 12/21/2022 2240 Gross per 24 hour  Intake 3 ml  Output --  Net 3 ml    Current Heart Failure Medications:  Loop diuretic: none Beta-Blocker: Metoprolol succinate 12.5 mg daily > Carvedilol 3.125 mg BID ACEI/ARB/ARNI: none MRA: none SGLT2i: none Other: none  Prior to admission Heart Failure Medications:  Loop diuretic: none Beta-Blocker: Metoprolol tartrate 12.5 mg BID ACEI/ARB/ARNI: Benazepril 10 mg daily (in combination with hydrochlorothiazide) MRA: none SGLT2i: none Other: none  Assessment: 1. Acute systolic heart failure (LVEF 35-40%)  , due to ischemic cardiomyopathy. NYHA class II symptoms.  -Symptoms: Feeling fine today. No shortness of breath -Volume: Euvolemic -Hemodynamics: BP up to 120-130s/70-90s. Heart rate 60-70s.  -BB: Metoprolol transitioned to carvedilol 3.125 mg BID. Titration outpatient. -ACEI/ARB/ARNI: Benazepril-hydrochlorothiazide transitioned to losartan 25 mg daily.  -MRA: Can consider adding outpatient -SGLT2i: Start Jardiance 10 mg daily and stop glipizide at discharge.  Plan: 1) Medication changes recommended at this time: -Discussed with providers. Will start Jardiance 10 mg daily for T2DM + HFrEF and stop glipizide to prevent hypoglycemia.   2) Patient assistance: -[place] Pepper  and Farxiga copay $0  3) Education: - Patient has been educated on current HF medications and potential additions to HF medication regimen - Patient verbalizes understanding that over the next few months, these medication doses may change and more medications may be added to optimize HF regimen - Patient has been educated on basic disease state pathophysiology and goals of therapy  Medication Assistance / Insurance Benefits Check: Does the patient have prescription insurance? Prescription  Insurance: Medicare  Type of insurance plan:  Does the patient qualify for medication assistance through manufacturers or grants? No  Eligible grants and/or patient assistance programs: no  Outpatient Pharmacy: Prior to admission outpatient pharmacy: [facility]   Is the patient willing to utilize a [facility] pharmacy at discharge?: Yes  Please do not hesitate to reach out with questions or concerns,  Enos Fling, PharmD, CPP, BCPS Heart Failure Pharmacist  Phone - [phone] 12/22/2022 9:57 AM

## 2022-12-22 NOTE — Discharge Summary (Signed)
Physician Discharge Summary   Patient: Ross Riley MRN: [id number] DOB: [birth year]  Admit date:     12/20/2022  Discharge date: 12/22/22  Discharge Physician: Enedina Finner   PCP: Smitty Cords, DO   Recommendations at discharge:    F/u dr Kirke Corin cardiology in 1-2 weeks F/u PCP in 1-2 weeks  Discharge Diagnoses: Principal Problem:   Acute ST elevation myocardial infarction (STEMI) due to occlusion of left anterior descending (LAD) coronary artery [facility]) Active Problems:   Type 2 diabetes mellitus with diabetic cataract, without long-term current use of insulin (HCC)   Bilateral cataracts   Hyperlipidemia associated with type 2 diabetes mellitus (HCC)   Essential hypertension   BPH (benign prostatic hyperplasia)   S/P TKR (total knee replacement), left   Hypothyroidism   Alcohol abuse   Ischemic cardiomyopathy  Resolved Problems:   ST elevation myocardial infarction (STEMI) (HCC)   STEMI (ST elevation myocardial infarction) (HCC)   Ross Riley is a 72 y.o. male with history of hyperlipidemia, non-insulin-dependent diabetes mellitus, hypertension, hypothyroid, GERD, alcohol use, who presented to the hospital with chest pain.  Initial high-sensitivity troponin was 772.   He was found to have acute ST elevation MI.  He underwent emergent left heart catheterization and stent was placed to LAD for occluded LAD.  Acute ST elevation MI: S/p left LAD coronary stent on 12/20/2022.   --Continue aspirin, Brilinta, high-dose statin, Toprol-XL.  Follow-up with cardiologist in 1 week --ok with Dr Kirke Corin for discharge today. Pt overall stable   Ischemic cardiomyopathy: 2D echo showed EF estimated at 35 to 40%.  -- Continue Toprol-XL   Hypokalemia: Replete potassium and monitor levels   Type II DM: Continue NovoLog as needed for hyperglycemia.  Home glipizide is on hold   Hyperlipidemia: Continue high-dose Lipitor   Overall stable. D/c home. Pt agreeable        Consultants: [facility] cardiology Procedures performed: cardiac cath  Disposition: Home Diet recommendation:  Discharge Diet Orders (From admission, onward)     Start     Ordered   12/22/22 0000  Diet - low sodium heart healthy        12/22/22 1003           Cardiac diet DISCHARGE MEDICATION: Allergies as of 12/22/2022       Reactions   Metformin And Related Other (See Comments)   Headache and dizzines   Metformin Hcl Other (See Comments)   headache and dizziness        Medication List     STOP taking these medications    benazepril-hydrochlorthiazide 10-12.5 MG tablet Commonly known as: LOTENSIN HCT   glipiZIDE 5 MG 24 hr tablet Commonly known as: GLUCOTROL XL   metoprolol tartrate 25 MG tablet Commonly known as: LOPRESSOR       TAKE these medications    Accu-Chek Aviva Plus w/Device Kit Use device to check blood sugar up to 1 time daily as directed   aspirin 81 MG chewable tablet Chew 1 tablet (81 mg total) by mouth daily. Start taking on: December 23, 2022   atorvastatin 80 MG tablet Commonly known as: LIPITOR Take 1 tablet (80 mg total) by mouth daily. Start taking on: December 23, 2022 What changed:  medication strength how much to take when to take this   carvedilol 3.125 MG tablet Commonly known as: COREG Take 1 tablet (3.125 mg total) by mouth 2 (two) times daily with a meal.   celecoxib 200 MG capsule Commonly known as:  Physician Discharge Summary   Patient: Ross Riley MRN: [id number] DOB: [birth year]  Admit date:     12/20/2022  Discharge date: 12/22/22  Discharge Physician: Enedina Finner   PCP: Smitty Cords, DO   Recommendations at discharge:    F/u dr Kirke Corin cardiology in 1-2 weeks F/u PCP in 1-2 weeks  Discharge Diagnoses: Principal Problem:   Acute ST elevation myocardial infarction (STEMI) due to occlusion of left anterior descending (LAD) coronary artery [facility]) Active Problems:   Type 2 diabetes mellitus with diabetic cataract, without long-term current use of insulin (HCC)   Bilateral cataracts   Hyperlipidemia associated with type 2 diabetes mellitus (HCC)   Essential hypertension   BPH (benign prostatic hyperplasia)   S/P TKR (total knee replacement), left   Hypothyroidism   Alcohol abuse   Ischemic cardiomyopathy  Resolved Problems:   ST elevation myocardial infarction (STEMI) (HCC)   STEMI (ST elevation myocardial infarction) (HCC)   Ross Riley is a 72 y.o. male with history of hyperlipidemia, non-insulin-dependent diabetes mellitus, hypertension, hypothyroid, GERD, alcohol use, who presented to the hospital with chest pain.  Initial high-sensitivity troponin was 772.   He was found to have acute ST elevation MI.  He underwent emergent left heart catheterization and stent was placed to LAD for occluded LAD.  Acute ST elevation MI: S/p left LAD coronary stent on 12/20/2022.   --Continue aspirin, Brilinta, high-dose statin, Toprol-XL.  Follow-up with cardiologist in 1 week --ok with Dr Kirke Corin for discharge today. Pt overall stable   Ischemic cardiomyopathy: 2D echo showed EF estimated at 35 to 40%.  -- Continue Toprol-XL   Hypokalemia: Replete potassium and monitor levels   Type II DM: Continue NovoLog as needed for hyperglycemia.  Home glipizide is on hold   Hyperlipidemia: Continue high-dose Lipitor   Overall stable. D/c home. Pt agreeable        Consultants: [facility] cardiology Procedures performed: cardiac cath  Disposition: Home Diet recommendation:  Discharge Diet Orders (From admission, onward)     Start     Ordered   12/22/22 0000  Diet - low sodium heart healthy        12/22/22 1003           Cardiac diet DISCHARGE MEDICATION: Allergies as of 12/22/2022       Reactions   Metformin And Related Other (See Comments)   Headache and dizzines   Metformin Hcl Other (See Comments)   headache and dizziness        Medication List     STOP taking these medications    benazepril-hydrochlorthiazide 10-12.5 MG tablet Commonly known as: LOTENSIN HCT   glipiZIDE 5 MG 24 hr tablet Commonly known as: GLUCOTROL XL   metoprolol tartrate 25 MG tablet Commonly known as: LOPRESSOR       TAKE these medications    Accu-Chek Aviva Plus w/Device Kit Use device to check blood sugar up to 1 time daily as directed   aspirin 81 MG chewable tablet Chew 1 tablet (81 mg total) by mouth daily. Start taking on: December 23, 2022   atorvastatin 80 MG tablet Commonly known as: LIPITOR Take 1 tablet (80 mg total) by mouth daily. Start taking on: December 23, 2022 What changed:  medication strength how much to take when to take this   carvedilol 3.125 MG tablet Commonly known as: COREG Take 1 tablet (3.125 mg total) by mouth 2 (two) times daily with a meal.   celecoxib 200 MG capsule Commonly known as:  Physician Discharge Summary   Patient: Ross Riley MRN: [id number] DOB: [birth year]  Admit date:     12/20/2022  Discharge date: 12/22/22  Discharge Physician: Enedina Finner   PCP: Smitty Cords, DO   Recommendations at discharge:    F/u dr Kirke Corin cardiology in 1-2 weeks F/u PCP in 1-2 weeks  Discharge Diagnoses: Principal Problem:   Acute ST elevation myocardial infarction (STEMI) due to occlusion of left anterior descending (LAD) coronary artery [facility]) Active Problems:   Type 2 diabetes mellitus with diabetic cataract, without long-term current use of insulin (HCC)   Bilateral cataracts   Hyperlipidemia associated with type 2 diabetes mellitus (HCC)   Essential hypertension   BPH (benign prostatic hyperplasia)   S/P TKR (total knee replacement), left   Hypothyroidism   Alcohol abuse   Ischemic cardiomyopathy  Resolved Problems:   ST elevation myocardial infarction (STEMI) (HCC)   STEMI (ST elevation myocardial infarction) (HCC)   Ross Riley is a 72 y.o. male with history of hyperlipidemia, non-insulin-dependent diabetes mellitus, hypertension, hypothyroid, GERD, alcohol use, who presented to the hospital with chest pain.  Initial high-sensitivity troponin was 772.   He was found to have acute ST elevation MI.  He underwent emergent left heart catheterization and stent was placed to LAD for occluded LAD.  Acute ST elevation MI: S/p left LAD coronary stent on 12/20/2022.   --Continue aspirin, Brilinta, high-dose statin, Toprol-XL.  Follow-up with cardiologist in 1 week --ok with Dr Kirke Corin for discharge today. Pt overall stable   Ischemic cardiomyopathy: 2D echo showed EF estimated at 35 to 40%.  -- Continue Toprol-XL   Hypokalemia: Replete potassium and monitor levels   Type II DM: Continue NovoLog as needed for hyperglycemia.  Home glipizide is on hold   Hyperlipidemia: Continue high-dose Lipitor   Overall stable. D/c home. Pt agreeable        Consultants: [facility] cardiology Procedures performed: cardiac cath  Disposition: Home Diet recommendation:  Discharge Diet Orders (From admission, onward)     Start     Ordered   12/22/22 0000  Diet - low sodium heart healthy        12/22/22 1003           Cardiac diet DISCHARGE MEDICATION: Allergies as of 12/22/2022       Reactions   Metformin And Related Other (See Comments)   Headache and dizzines   Metformin Hcl Other (See Comments)   headache and dizziness        Medication List     STOP taking these medications    benazepril-hydrochlorthiazide 10-12.5 MG tablet Commonly known as: LOTENSIN HCT   glipiZIDE 5 MG 24 hr tablet Commonly known as: GLUCOTROL XL   metoprolol tartrate 25 MG tablet Commonly known as: LOPRESSOR       TAKE these medications    Accu-Chek Aviva Plus w/Device Kit Use device to check blood sugar up to 1 time daily as directed   aspirin 81 MG chewable tablet Chew 1 tablet (81 mg total) by mouth daily. Start taking on: December 23, 2022   atorvastatin 80 MG tablet Commonly known as: LIPITOR Take 1 tablet (80 mg total) by mouth daily. Start taking on: December 23, 2022 What changed:  medication strength how much to take when to take this   carvedilol 3.125 MG tablet Commonly known as: COREG Take 1 tablet (3.125 mg total) by mouth 2 (two) times daily with a meal.   celecoxib 200 MG capsule Commonly known as:  CELEBREX Take 200 mg by mouth 2 (two) times daily as needed for mild pain (pain score 1-3) or moderate pain (pain score 4-6).   dicyclomine 10 MG capsule Commonly known as: BENTYL Take 1 capsule (10 mg total) by mouth 4 (four) times daily -  before meals and at bedtime. As needed for abdominal pain cramping   dorzolamide-timolol 2-0.5 % ophthalmic solution Commonly known as: COSOPT Place 1 drop into both eyes 2 (two) times daily.   empagliflozin 10 MG Tabs tablet Commonly known as: JARDIANCE Take 1 tablet (10 mg  total) by mouth daily.   folic acid 1 MG tablet Commonly known as: FOLVITE Take 1 tablet (1 mg total) by mouth daily. Start taking on: December 23, 2022   gabapentin 300 MG capsule Commonly known as: NEURONTIN Take 1 capsule (300 mg total) by mouth 2 (two) times daily. What changed:  how much to take when to take this additional instructions   glucose blood test strip check blood sugar up to 1 time daily as directed   latanoprost 0.005 % ophthalmic solution Commonly known as: XALATAN Place 1 drop into both eyes at bedtime.   levothyroxine 75 MCG tablet Commonly known as: SYNTHROID Take 75 mcg by mouth daily before breakfast.   losartan 25 MG tablet Commonly known as: COZAAR Take 1 tablet (25 mg total) by mouth daily.   multivitamin with minerals Tabs tablet Take 1 tablet by mouth daily. Start taking on: December 23, 2022   omeprazole 40 MG capsule Commonly known as: PRILOSEC TAKE 1 CAPSULE(40 MG) BY MOUTH DAILY BEFORE BREAKFAST   ticagrelor 90 MG Tabs tablet Commonly known as: BRILINTA Take 1 tablet (90 mg total) by mouth 2 (two) times daily.        Follow-up Information     Smitty Cords, DO. Schedule an appointment as soon as possible for a visit in 1 week(s).   Specialty: Family Medicine Why: stemi Contact information: [address] [place] [address] [phone]         Iran Ouch, MD. Schedule an appointment as soon as possible for a visit in 1 week(s).   Specialty: Cardiology Why: f/u STEMI Contact information: [address] [place] [address] [place] [address] [phone]                Discharge Exam: Ceasar Mons Weights   12/20/22 0957 12/20/22 1233  Weight: 78 kg 81.1 kg   CV s1 s2 normal Resp: clear to auscultation  Condition at discharge: fair  The results of significant diagnostics from this hospitalization (including imaging, microbiology, ancillary and laboratory) are listed below for reference.    Imaging Studies: ECHOCARDIOGRAM COMPLETE  Result Date: 12/20/2022    ECHOCARDIOGRAM REPORT   Patient Name:   WILL CHAVEZ Date of Exam: 12/20/2022 Medical Rec #:  [id number]        Height:       66.0 in Accession #:    [id number]       Weight:       178.8 lb Date of Birth:  [birth year]         BSA:          1.907 m Patient Age:    72 years         BP:           100/75 mmHg Patient Gender: M                HR:  Physician Discharge Summary   Patient: Ross Riley MRN: [id number] DOB: [birth year]  Admit date:     12/20/2022  Discharge date: 12/22/22  Discharge Physician: Enedina Finner   PCP: Smitty Cords, DO   Recommendations at discharge:    F/u dr Kirke Corin cardiology in 1-2 weeks F/u PCP in 1-2 weeks  Discharge Diagnoses: Principal Problem:   Acute ST elevation myocardial infarction (STEMI) due to occlusion of left anterior descending (LAD) coronary artery [facility]) Active Problems:   Type 2 diabetes mellitus with diabetic cataract, without long-term current use of insulin (HCC)   Bilateral cataracts   Hyperlipidemia associated with type 2 diabetes mellitus (HCC)   Essential hypertension   BPH (benign prostatic hyperplasia)   S/P TKR (total knee replacement), left   Hypothyroidism   Alcohol abuse   Ischemic cardiomyopathy  Resolved Problems:   ST elevation myocardial infarction (STEMI) (HCC)   STEMI (ST elevation myocardial infarction) (HCC)   Ross Riley is a 72 y.o. male with history of hyperlipidemia, non-insulin-dependent diabetes mellitus, hypertension, hypothyroid, GERD, alcohol use, who presented to the hospital with chest pain.  Initial high-sensitivity troponin was 772.   He was found to have acute ST elevation MI.  He underwent emergent left heart catheterization and stent was placed to LAD for occluded LAD.  Acute ST elevation MI: S/p left LAD coronary stent on 12/20/2022.   --Continue aspirin, Brilinta, high-dose statin, Toprol-XL.  Follow-up with cardiologist in 1 week --ok with Dr Kirke Corin for discharge today. Pt overall stable   Ischemic cardiomyopathy: 2D echo showed EF estimated at 35 to 40%.  -- Continue Toprol-XL   Hypokalemia: Replete potassium and monitor levels   Type II DM: Continue NovoLog as needed for hyperglycemia.  Home glipizide is on hold   Hyperlipidemia: Continue high-dose Lipitor   Overall stable. D/c home. Pt agreeable        Consultants: [facility] cardiology Procedures performed: cardiac cath  Disposition: Home Diet recommendation:  Discharge Diet Orders (From admission, onward)     Start     Ordered   12/22/22 0000  Diet - low sodium heart healthy        12/22/22 1003           Cardiac diet DISCHARGE MEDICATION: Allergies as of 12/22/2022       Reactions   Metformin And Related Other (See Comments)   Headache and dizzines   Metformin Hcl Other (See Comments)   headache and dizziness        Medication List     STOP taking these medications    benazepril-hydrochlorthiazide 10-12.5 MG tablet Commonly known as: LOTENSIN HCT   glipiZIDE 5 MG 24 hr tablet Commonly known as: GLUCOTROL XL   metoprolol tartrate 25 MG tablet Commonly known as: LOPRESSOR       TAKE these medications    Accu-Chek Aviva Plus w/Device Kit Use device to check blood sugar up to 1 time daily as directed   aspirin 81 MG chewable tablet Chew 1 tablet (81 mg total) by mouth daily. Start taking on: December 23, 2022   atorvastatin 80 MG tablet Commonly known as: LIPITOR Take 1 tablet (80 mg total) by mouth daily. Start taking on: December 23, 2022 What changed:  medication strength how much to take when to take this   carvedilol 3.125 MG tablet Commonly known as: COREG Take 1 tablet (3.125 mg total) by mouth 2 (two) times daily with a meal.   celecoxib 200 MG capsule Commonly known as:

## 2022-12-22 NOTE — TOC Benefit Eligibility Note (Addendum)
Patient Product/process development scientist completed.    The patient is insured through [facility]. Patient has Medicare and is not eligible for a copay card, but may be able to apply for patient assistance, if available.    Ran test claim for Brilinta 90 mg and the current 30 day co-pay is $0.00.  Ran test claim for Farxiga 10 mg and the current 30 day co-pay is $0.00.  Ran test claim for Jardiance 10 mg and the current 30 day co-pay is $0.00.   This test claim was processed through [facility]- copay amounts may vary at other pharmacies due to pharmacy/plan contracts, or as the patient moves through the different stages of their insurance plan.     Roland Earl, CPHT Pharmacy Technician III Certified Patient Advocate [facility] Patient Advocate Team Direct Number: [phone]  Fax: [phone]

## 2022-12-22 NOTE — Care Management Important Message (Signed)
Important Message  Patient Details  Name: Ross Riley MRN: [id number] Date of Birth: 07/10/1950   Important Message Given:  Yes - Medicare IM     Darolyn Rua, LCSW 12/22/2022, 11:43 AM

## 2022-12-22 NOTE — Progress Notes (Signed)
Reviewed discharge instructions with patient. Answered all of patients questions regarding discharge instructions. Patients IV's have been removed without complication and he has been removed from telemetry. Pharmacy brought patient his medications and reviewed his medications with him. Patient's wife will meet him at the front of the hospital and transport him home. Patient will be transported downstairs by wheelchair and will meet his wife at the front of the hospital.

## 2022-12-23 LAB — LIPOPROTEIN A (LPA): Lipoprotein (a): 18.5 nmol/L (ref <75.0)

## 2022-12-24 ENCOUNTER — Telehealth: Payer: Self-pay | Admitting: Family Medicine

## 2022-12-24 ENCOUNTER — Telehealth: Payer: Self-pay

## 2022-12-24 ENCOUNTER — Telehealth: Payer: Self-pay | Admitting: Cardiovascular Disease

## 2022-12-24 ENCOUNTER — Ambulatory Visit: Payer: 59 | Attending: Student | Admitting: Student

## 2022-12-24 ENCOUNTER — Encounter: Payer: Self-pay | Admitting: Student

## 2022-12-24 VITALS — BP 120/80 | HR 57 | Ht 66.0 in | Wt 176.5 lb

## 2022-12-24 DIAGNOSIS — I10 Essential (primary) hypertension: Secondary | ICD-10-CM | POA: Diagnosis not present

## 2022-12-24 DIAGNOSIS — I5020 Unspecified systolic (congestive) heart failure: Secondary | ICD-10-CM | POA: Diagnosis not present

## 2022-12-24 DIAGNOSIS — E1136 Type 2 diabetes mellitus with diabetic cataract: Secondary | ICD-10-CM

## 2022-12-24 DIAGNOSIS — I255 Ischemic cardiomyopathy: Secondary | ICD-10-CM

## 2022-12-24 DIAGNOSIS — E785 Hyperlipidemia, unspecified: Secondary | ICD-10-CM | POA: Diagnosis not present

## 2022-12-24 DIAGNOSIS — I25118 Atherosclerotic heart disease of native coronary artery with other forms of angina pectoris: Secondary | ICD-10-CM

## 2022-12-24 MED ORDER — ACCU-CHEK AVIVA PLUS VI STRP: ORAL_STRIP | 12 refills | Status: AC

## 2022-12-24 MED ORDER — NITROGLYCERIN 0.4 MG SL SUBL
0.4000 mg | SUBLINGUAL_TABLET | SUBLINGUAL | 0 refills | Status: DC | PRN
Start: 2022-12-24 — End: 2023-01-23

## 2022-12-24 MED ORDER — ACCU-CHEK AVIVA PLUS W/DEVICE KIT: PACK | 0 refills | Status: AC

## 2022-12-24 MED ORDER — ACCU-CHEK SOFTCLIX LANCETS MISC: 12 refills | Status: AC

## 2022-12-24 MED ORDER — ISOSORBIDE MONONITRATE ER 30 MG PO TB24: 15.0000 mg | ORAL_TABLET | Freq: Every day | ORAL | 0 refills | Status: DC

## 2022-12-24 NOTE — Telephone Encounter (Signed)
Rx sent to [facility] for Accu-chek aviva device, test strip + lancets  Saralyn Pilar, DO [facility] 12/24/2022, 5:49 PM

## 2022-12-24 NOTE — Patient Instructions (Addendum)
Medication Instructions:  Take Nitroglycerin as needed for chest pain: Place one tablet under the tongue as needed every 5 minutes for chest pain. If you have to use three tablets with no relief, please call 911 or go to your closest Emergency Department.  START Imdur 15 mg once daily (Half of the 30 mg tablet)  *If you need a refill on your cardiac medications before your next appointment, please call your pharmacy*   Lab Work: None ordered If you have labs (blood work) drawn today and your tests are completely normal, you will receive your results only by: MyChart Message (if you have MyChart) OR A paper copy in the mail If you have any lab test that is abnormal or we need to change your treatment, we will call you to review the results.   Testing/Procedures: None ordered   Follow-Up: At [facility], you and your health needs are our priority.  As part of our continuing mission to provide you with exceptional heart care, we have created designated Provider Care Teams.  These Care Teams include your primary Cardiologist (physician) and Advanced Practice Providers (APPs -  Physician Assistants and Nurse Practitioners) who all work together to provide you with the care you need, when you need it.  We recommend signing up for the patient portal called "MyChart".  Sign up information is provided on this After Visit Summary.  MyChart is used to connect with patients for Virtual Visits (Telemedicine).  Patients are able to view lab/test results, encounter notes, upcoming appointments, etc.  Non-urgent messages can be sent to your provider as well.   To learn more about what you can do with MyChart, go to [email or web address].    Your next appointment:   1 month(s)  Provider:   You may see Julien Nordmann, MD or one of the following Advanced Practice Providers on your designated Care Team:   Nicolasa Ducking, NP Eula Listen, PA-C Cadence Fransico Michael, PA-C Charlsie Quest, NP

## 2022-12-24 NOTE — Telephone Encounter (Signed)
[facility] called ad spoke to [place], Pensions consultant to ask if there was a glucose machine on file that the patient uses. She says there is no machine on file. Patient's daughter [name], on [place], called and asked what type of meter. She says Accucheck Aviva. Advised Dr. Althea Charon will be made aware so that the Rx can be sent for test strips and lancets to [facility]. She asked how to use the lancets, advised to check with the pharmacist when she picks them up.

## 2022-12-24 NOTE — Transitions of Care (Post Inpatient/ED Visit) (Signed)
12/24/2022  Name: Ross Riley MRN: [id number] DOB: Feb 03, 1951  Today's TOC FU Call Status: Today's TOC FU Call Status:: Successful TOC FU Call Completed TOC FU Call Complete Date: 12/24/22 Patient's Name and Date of Birth confirmed.  Transition Care Management Follow-up Telephone Call Date of Discharge: 12/22/22 Discharge Facility: [facility]) Type of Discharge: Inpatient Admission Primary Inpatient Discharge Diagnosis:: STEMI w/ occlusion LAD How have you been since you were released from the hospital?: Worse (He reports SOB w exerction, + LE Edema ( pitting) per Dtr., Weight checked during visit 181 ( prev 178)) Any questions or concerns?: Yes Patient Questions/Concerns:: SOB, Needs Glucometer strips and lancets Patient Questions/Concerns Addressed: Other: (Recc call Pharmacy for refill for glucometer supplies)  Items Reviewed: Did you receive and understand the discharge instructions provided?: Yes Medications obtained,verified, and reconciled?: Yes (Medications Reviewed) (Needs Glucometer strips and Lancets) Any new allergies since your discharge?: No Dietary orders reviewed?: Yes Type of Diet Ordered:: Reg Heart Healthy, Carb Modified Do you have support at home?: Yes People in Home: child(ren), adult, spouse Name of Support/Comfort Primary Source: Spouse Blanca Dtr [name]  Medications Reviewed Today: Medications Reviewed Today     Reviewed by Johnnette Barrios, RN (Registered Nurse) on 12/24/22 at 1336  Med List Status: <None>   Medication Order Taking? Sig Documenting Provider Last Dose Status Informant  aspirin 81 MG chewable tablet [id number] Yes Chew 1 tablet (81 mg total) by mouth daily. Enedina Finner, MD Taking Active   atorvastatin (LIPITOR) 80 MG tablet [id number] Yes Take 1 tablet (80 mg total) by mouth daily. Enedina Finner, MD Taking Active   Blood Glucose Monitoring Suppl (ACCU-CHEK AVIVA PLUS) w/Device KIT [id number] Yes Use device to  check blood sugar up to 1 time daily as directed Smitty Cords, DO Taking Active Pharmacy Records  carvedilol (COREG) 3.125 MG tablet [id number] No Take 1 tablet (3.125 mg total) by mouth 2 (two) times daily with a meal.  Patient not taking: Reported on 12/24/2022   Enedina Finner, MD Not Taking Active   celecoxib (CELEBREX) 200 MG capsule [id number] Yes Take 200 mg by mouth 2 (two) times daily as needed for mild pain (pain score 1-3) or moderate pain (pain score 4-6). [provider] Taking Active Pharmacy Records  dicyclomine (BENTYL) 10 MG capsule [id number] Yes Take 1 capsule (10 mg total) by mouth 4 (four) times daily -  before meals and at bedtime. As needed for abdominal pain cramping Smitty Cords, DO Taking Active Pharmacy Records  dorzolamide-timolol (COSOPT) 22.3-6.8 MG/ML ophthalmic solution [id number] Yes Place 1 drop into both eyes 2 (two) times daily. [provider] Taking Active Pharmacy Records  empagliflozin (JARDIANCE) 10 MG TABS tablet [id number] Yes Take 1 tablet (10 mg total) by mouth daily. Enedina Finner, MD Taking Active   folic acid (FOLVITE) 1 MG tablet [id number] Yes Take 1 tablet (1 mg total) by mouth daily. Enedina Finner, MD Taking Active   gabapentin (NEURONTIN) 300 MG capsule [id number] Yes Take 1 capsule (300 mg total) by mouth 2 (two) times daily.  Patient taking differently: Take 300-600 mg by mouth See admin instructions. Take 1 capsule (300mg ) by mouth every morning, 1 capsule (300mg ) by mouth every day at noon and take 2 capsule (600mg ) by mouth at bedtime   Smitty Cords, DO Taking Active Pharmacy Records  glucose blood test strip [id number] Yes check blood sugar up to 1 time daily as directed Smitty Cords, DO Taking Active Pharmacy Records  latanoprost (XALATAN) 0.005 % ophthalmic solution [id number] Yes Place 1 drop into both eyes at bedtime. [provider] Taking Active Pharmacy Records  levothyroxine  (SYNTHROID) 75 MCG tablet [id number] Yes Take 75 mcg by mouth daily before breakfast. [provider] Taking Active Pharmacy Records  losartan (COZAAR) 25 MG tablet [id number] Yes Take 1 tablet (25 mg total) by mouth daily. Enedina Finner, MD Taking Active   Multiple Vitamin (MULTIVITAMIN WITH MINERALS) TABS tablet [id number] Yes Take 1 tablet by mouth daily. Enedina Finner, MD Taking Active   omeprazole (PRILOSEC) 40 MG capsule [id number] Yes TAKE 1 CAPSULE(40 MG) BY MOUTH DAILY BEFORE BREAKFAST Smitty Cords, DO Taking Active Pharmacy Records  ticagrelor [facility]) 90 MG TABS tablet [id number] Yes Take 1 tablet (90 mg total) by mouth 2 (two) times daily. Enedina Finner, MD Taking Active             Home Care and Equipment/Supplies: Were Home Health Services Ordered?: No Any new equipment or medical supplies ordered?: No  Functional Questionnaire: Do you need assistance with bathing/showering or dressing?: No Do you need assistance with meal preparation?: Yes (Spouse makes meals) Do you need assistance with eating?: No Do you have difficulty maintaining continence: No Do you need assistance with getting out of bed/getting out of a chair/moving?: No (noted increased SOB with exerction even mild tasks) Do you have difficulty managing or taking your medications?: No (He self manages and ues a pill box)  Follow up appointments reviewed: PCP Follow-up appointment confirmed?: No (Daughter pans to schedule today) MD Provider Line [phone] Given: No Specialist Hospital Follow-up appointment confirmed?: Yes Date of Specialist follow-up appointment?: 12/29/22 Follow-Up Specialty Provider:: Cardio f/u 10/28 Do you need transportation to your follow-up appointment?: No (He is able to drive but spouse will transport) Do you understand care options if your condition(s) worsen?: Yes-patient verbalized understanding (Reviewed w/ daughter , she translated for patient)    Patient  is at high risk for readmission and/or has history of  high utilization  Discussed VBCI  TOC program and weekly calls to patient to assess condition/status, medication management  and provide support/education as indicated . Patient/ Caregiver voiced understanding and declined enrollment in the 30-day TOC Program.      The patient has been provided with contact information for the care management team and has been advised to call with any health related questions or concerns, or if they change their mind and wish to enroll.  Susa Loffler , BSN, RN Care Management Coordinator [place]   [facility] [email or web address] Direct Dial: [phone]

## 2022-12-24 NOTE — Telephone Encounter (Signed)
New Message:     Nurse is calling wants  this patient seen asap, she is on the phone waiting to someone..  Pt c/o Shortness Of Breath: STAT if SOB developed within the last 24 hours or pt is noticeably SOB on the phone  1. Are you currently SOB (can you hear that pt is SOB on the phone)? She says patient is very short of breath with any movement and pitting Edema  2. How long have you been experiencing SOB? Since 10-221-24  3. Are you SOB when sitting or when up moving around? Any type of movement  4.  Are you currently experiencing any other symptoms? Weight gain of 3 lbs and pitting Edema t

## 2022-12-24 NOTE — Progress Notes (Signed)
Cardiology Clinic Note   Date: 12/24/2022 ID: Maverick, Ponting [birth year], MRN [id number]  Primary Cardiologist:  Julien Nordmann, MD  Patient Profile    Ross Riley is a 72 y.o. male who presents to the clinic today for evaluation of chest pain and shortness of breath.     Past medical history significant for: CAD. Coronary CTA with FFR 11/18/2021: Coronary calcium score 265 (61st percentile).  Moderate stenosis of proximal LAD.  Mild stenosis proximal RCA.  FFR did not show significant stenosis. LHC 12/20/2022 (STEMI): Proximal to mid LAD 100%.  Distal LAD 60%.  PCI with DES 3.5 x 34 mm to proximal and mid LAD.  D1 and D2 jailed by stent with sluggish flow in D2 bifurcates to small rescue. Chronic HFrEF/Ischemic cardiomyopathy. Echo 12/20/2022: EF 35 to 40%.  Akinesis of the left ventricle entire anteroseptal wall and apical segments.  Mild LVH.  Normal RV function. Hypertension. Hyperlipidemia. Lipid panel 12/20/2022: LDL 90, HDL 49, TG 166, total 172. LPa 12/21/2022: 18.5. T2DM. Hypothyroidism. EtOH abuse.     History of Present Illness    Ross Riley was first evaluated by Dr. Mariah Milling on 11/05/2021 for chest pain and shortness of breath at the request of Dr. Althea Charon. He was seen in the emergency room in May 2023 for substernal chest pain. Troponin negative, EKG non-acute. Given GI cocktail and discharged on PPI.  Coronary CTA showed calcium score of 265 with moderate stenosis in proximal LAD mild stenosis of the proximal RCA, FFR did not show significant stenosis.  Patient presented to the ED via EMS on 12/20/2022 with complaints of left-sided chest pain.  EMS gave aspirin 324 mg but patient vomited shortly after.  Initial EKG demonstrated inferior T wave inversions.  Repeat EKG was consistent with STEMI.  Troponin elevated to 700.  Patient taken emergently to the Cath Lab and underwent PCI with DES to proximal to mid LAD.  Patient continued to complain of chest pain  throughout hospital stay.  Concern for alcohol withdrawal secondary to patient's excessive alcohol use. Patient discharged 12/22/2022 on aspirin, Brilinta, atorvastatin, Jardiance, losartan.  Patient's case manager contacted the office today stating patient complaining of shortness of breath with exertion and 3 pound weight gain overnight.  Patient was scheduled for visit this afternoon.  Patient is accompanied by wife.  Interpreter via video assisted with visit Ross Riley [phone]).  Patient reports continued left sided chest pain and shortness of breath that comes and goes since hospitalization.  He reports the pain is the same as what brought him to the hospital and unchanged since discharge.  Pain occurs without exertion and resolves on its own.  It is always associated with shortness of breath.  Patient did not receive as needed NTG at the time of his discharge.  Patient also reports edema in bilateral feet over the last couple of days.  He weighed yesterday and was 3 pounds up from prior to hospital admission.  Normal weight 168-170 LB.  Weight today 172 LB.   ROS: All other systems reviewed and are otherwise negative except as noted in History of Present Illness.  Studies Reviewed    EKG Interpretation Date/Time:  Wednesday December 24 2022 15:28:59 EDT Ventricular Rate:  57 PR Interval:  124 QRS Duration:  92 QT Interval:  478 QTC Calculation: 465 R Axis:   -54  Text Interpretation: Sinus bradycardia Left axis deviation T wave abnormality, consider anterolateral ischemia When compared with ECG of 21-Dec-2022 18:55, No significant change  was found Confirmed by Ross Riley [phone]) on 12/24/2022 4:01:20 PM       Physical Exam    VS:  BP 120/80 (BP Location: Left Arm, Patient Position: Sitting, Cuff Size: Normal)   Pulse (!) 57   Ht 5\' 6"  (1.676 m)   Wt 176 lb 8 oz (80.1 kg)   SpO2 97%   BMI 28.49 kg/m  , BMI Body mass index is 28.49 kg/m.  GEN: Well nourished, well developed,  in no acute distress. Neck: No JVD or carotid bruits. Cardiac:  RRR. No murmurs. No rubs or gallops.   Respiratory:  Respirations regular and unlabored. Clear to auscultation without rales, wheezing or rhonchi. GI: Soft, nontender, nondistended. Extremities: Radials/DP/PT 2+ and equal bilaterally. No clubbing or cyanosis. No edema.  Skin: Warm and dry, no rash. Neuro: Strength intact.  Assessment & Plan   CAD/angina S/p PCI with DES to proximal to mid LAD in the setting of STEMI 12/20/2022.  Patient continued to complain of left-sided chest pain throughout hospital admission.  He was placed on Nitropaste during admission with continued complaints of pain and shortness of breath.  Patient reports continued intermittent chest pain and shortness of breath since discharge from hospital.  Pain occurs without exertion and resolves on its own.  He did not receive NTG at discharge.  Patient reports compliance with aspirin and Brilinta.  EKG today without acute changes. -Start isosorbide 15 mg daily.  Provide Rx for as needed SL NTG. -Continue aspirin, Brilinta, carvedilol, atorvastatin. -Patient is instructed if isosorbide does not resolve pain he may take as needed NTG.  If pain does not resolve he is to report to the ED.  Chronic HFrEF/ischemic cardiomyopathy Echo 12/20/2022 showed EF 35 to 40%.  Patient reports noticing edema bilateral feet over the last couple of days.  He reports weighing today and noticing 3 pound weight gain since hospital admission.  Normal weight 168-170 LB and weight today 172 LB.  He denies increased sodium intake and reports he tries to elevate his legs throughout the day.  He has no edema on exam today. Euvolemic and well compensated on exam. -Diuretic not indicated at this time. -Instructed patient to weigh daily and contact the office with weight gain of 3 pounds overnight or 5 pounds in a week. -Continue carvedilol, Jardiance, losartan.  Hypertension BP today  120/80. -Continue carvedilol, losartan.  Hyperlipidemia LDL October 2024 90, not at goal.  Patient reported he was not taking atorvastatin secondary to being told to stop it.  Explained to patient this is a medication he needs to continue taking.  He does have this at home and will restart it. -Continue atorvastatin.  Disposition: Isosorbide 15 mg daily.  As needed SL NTG.  ED precautions provided.  Return in 1 month or sooner as needed.    Cardiac Rehabilitation Eligibility Assessment  The patient is NOT ready to start cardiac rehabilitation due to: Other (Will reasses at followup in one month)        Signed, Etta Grandchild. Arnetta Odeh, DNP, NP-C

## 2022-12-24 NOTE — Transitions of Care (Post Inpatient/ED Visit) (Signed)
12/24/2022  Name: AISHA OBERMANN MRN: [id number] DOB: 1950/04/29   Patent reporting Increases SOB with any exertion. He reports he  has to move very slow because he has trouble breathing. Per daughter he does have LE edea , she was able to verify it it pitting edema and there is a indentation in skin when pressed.  She was able to weigh hm on a standing scale Weight today is 18 . Prev weight was 178. He is not on any diuretic , He does not use O2  Daughter was calling today for a PCP post hospital follow-up. Call placed to Cardiology ( [phone]) Pt Cardiologist Dr Mariah Milling. He has a prescheduled appt 12/29/22 ) Spoke with Nettie Elm reviewed changes requested a appt ASAP.  Availability today and 12/26/22. Spoke with Nurse Raynelle Fanning Pt is able to be see today @ 3:10pm. Spoke with Daughter . They are able to transport him for appt today. Verified Cardiology address and time.  Susa Loffler , BSN, RN Care Management Coordinator [place]   [facility] [email or web address] Direct Dial: [phone]

## 2022-12-24 NOTE — Telephone Encounter (Signed)
Received call from case management at [facility]- advising patient was recent stemi, called to check on him today and his family is stating he is sob with exertion, having weight gain of 3 lbs. Was 178- and now 181 lbs. Case worker was advised by scheduling team we had a open spot this afternoon. She will contact him and his family and have him arrive at 3:10 PM.   Thanks!

## 2022-12-24 NOTE — Telephone Encounter (Signed)
Medication Refill - Medication:  glucose blood test strip ,  the lancets to go w/ this  Has the patient contacted their pharmacy? Yes.   Daughter is calling to ask for this refill.  She said the pharmacy tod her to call her dr  Preferred Pharmacy (with phone number or street name): [id number] - [place], [place] - [address] AT [facility] OF [address]  Has the patient been seen for an appointment in the last year OR does the patient have an upcoming appointment? Yes.   Office. Agent: Please be advised that RX refills may take up to 3 business days. We ask that you follow-up with your pharmacy.

## 2022-12-26 ENCOUNTER — Encounter: Payer: Self-pay | Admitting: Family Medicine

## 2022-12-26 ENCOUNTER — Ambulatory Visit: Payer: 59 | Admitting: Family Medicine

## 2022-12-26 VITALS — BP 112/68 | HR 58 | Ht 66.0 in | Wt 176.0 lb

## 2022-12-26 DIAGNOSIS — I25118 Atherosclerotic heart disease of native coronary artery with other forms of angina pectoris: Secondary | ICD-10-CM

## 2022-12-26 DIAGNOSIS — R0600 Dyspnea, unspecified: Secondary | ICD-10-CM

## 2022-12-26 DIAGNOSIS — Z23 Encounter for immunization: Secondary | ICD-10-CM

## 2022-12-26 NOTE — Progress Notes (Signed)
Subjective:    Patient ID: DIN ALHASSAN, male    DOB: 11-29-1950, 72 y.o.   MRN: [id number]  Ross Riley is a 72 y.o. male presenting on 12/26/2022 for hospital f/u (Heart attack--feeling tired and having hard time breathing.)   HPI  Discussed the use of AI scribe software for clinical note transcription with the patient, who gave verbal consent to proceed.      HOSPITAL FOLLOW-UP VISIT  Hospital/Location: [facility] Date of Admission: 12/20/22 Date of Discharge: 12/22/22 Transitions of care telephone call: Completed on 12/24/22 Tiffany Kocher RN  Reason for Admission: Heart Attack MI  [facility] H&P and Discharge Summary have been reviewed - Patient presents today 4 days after recent hospitalization. Brief summary of recent course, patient had symptoms of chest pain and dyspnea, hospitalized, on 12/20/22 advised to go immediately to ED, treated with cardiac cath and DES Stent. Med management opimtizaton.  - Today reports overall has done well after discharge. Symptoms of chest pain have improved, only mild chest pain residual now.  He has seen Cardiology for HFU already on 12/24/22 given new rx Imdur 15mg  daily for angina symptoms, he will pause the SL NTG and use AS NEEDED only.   He admits some difficulty taking deep breath at this time.  Note Medtronic Onyx Frontier 3.5 mm x 34 mm [id number] Zotarolimus-Eluting Coronary Stent System   I have reviewed the discharge medication list, and have reconciled the current and discharge medications today.   Current Outpatient Medications:    ACCU-CHEK AVIVA PLUS test strip, Use to check blood sugar up to 1 x per day, Disp: 100 each, Rfl: 12   Accu-Chek Softclix Lancets lancets, Use to check blood sugar up to 1 x per day, Disp: 100 each, Rfl: 12   aspirin 81 MG chewable tablet, Chew 1 tablet (81 mg total) by mouth daily., Disp: 30 tablet, Rfl: 2   Blood Glucose Monitoring Suppl (ACCU-CHEK AVIVA PLUS) w/Device  KIT, Use to check blood sugar up to 1 x per day, Disp: 1 kit, Rfl: 0   carvedilol (COREG) 3.125 MG tablet, Take 1 tablet (3.125 mg total) by mouth 2 (two) times daily with a meal., Disp: 60 tablet, Rfl: 2   celecoxib (CELEBREX) 200 MG capsule, Take 200 mg by mouth 2 (two) times daily as needed for mild pain (pain score 1-3) or moderate pain (pain score 4-6)., Disp: , Rfl:    dicyclomine (BENTYL) 10 MG capsule, Take 1 capsule (10 mg total) by mouth 4 (four) times daily -  before meals and at bedtime. As needed for abdominal pain cramping, Disp: 30 capsule, Rfl: 2   dorzolamide-timolol (COSOPT) 22.3-6.8 MG/ML ophthalmic solution, Place 1 drop into both eyes 2 (two) times daily., Disp: , Rfl:    empagliflozin (JARDIANCE) 10 MG TABS tablet, Take 1 tablet (10 mg total) by mouth daily., Disp: 30 tablet, Rfl: 2   folic acid (FOLVITE) 1 MG tablet, Take 1 tablet (1 mg total) by mouth daily., Disp: 30 tablet, Rfl: 2   gabapentin (NEURONTIN) 300 MG capsule, Take 1 capsule (300 mg total) by mouth 2 (two) times daily. (Patient taking differently: Take 300-600 mg by mouth See admin instructions. Take 1 capsule (300mg ) by mouth every morning, 1 capsule (300mg ) by mouth every day at noon and take 2 capsule (600mg ) by mouth at bedtime), Disp: 180 capsule, Rfl: 3   isosorbide mononitrate (IMDUR) 30 MG 24 hr tablet, Take 0.5 tablets (15 mg total) by mouth daily.,  Disp: 45 tablet, Rfl: 0   latanoprost (XALATAN) 0.005 % ophthalmic solution, Place 1 drop into both eyes at bedtime., Disp: , Rfl: 6   levothyroxine (SYNTHROID) 75 MCG tablet, Take 75 mcg by mouth daily before breakfast., Disp: , Rfl:    losartan (COZAAR) 25 MG tablet, Take 1 tablet (25 mg total) by mouth daily., Disp: 30 tablet, Rfl: 2   Multiple Vitamin (MULTIVITAMIN WITH MINERALS) TABS tablet, Take 1 tablet by mouth daily., Disp: 30 tablet, Rfl: 2   nitroGLYCERIN (NITROSTAT) 0.4 MG SL tablet, Place 1 tablet (0.4 mg total) under the tongue every 5 (five)  minutes as needed for chest pain., Disp: 25 tablet, Rfl: 0   omeprazole (PRILOSEC) 40 MG capsule, TAKE 1 CAPSULE(40 MG) BY MOUTH DAILY BEFORE BREAKFAST, Disp: 90 capsule, Rfl: 1   ticagrelor (BRILINTA) 90 MG TABS tablet, Take 1 tablet (90 mg total) by mouth 2 (two) times daily., Disp: 60 tablet, Rfl: 2  ------------------------------------------------------------------------- Social History   Tobacco Use   Smoking status: Former    Current packs/day: 0.00    Average packs/day: 1 pack/day for 32.0 years (32.0 ttl pk-yrs)    Types: Cigarettes    Start date: 03/04/1959    Quit date: 03/04/1991    Years since quitting: 31.8   Smokeless tobacco: Former  Building services engineer status: Never Used  Substance Use Topics   Alcohol use: Yes    Alcohol/week: 7.0 standard drinks of alcohol    Types: 7 Shots of liquor per week    Comment: 1-2 shots of vodka daily   Drug use: No    Review of Systems Per HPI unless specifically indicated above     Objective:    BP 112/68 (BP Location: Left Arm, Patient Position: Sitting, Cuff Size: Normal)   Pulse (!) 58   Ht 5\' 6"  (1.676 m)   Wt 176 lb (79.8 kg)   SpO2 98%   BMI 28.41 kg/m   Wt Readings from Last 3 Encounters:  12/26/22 176 lb (79.8 kg)  12/24/22 176 lb 8 oz (80.1 kg)  12/20/22 178 lb 12.7 oz (81.1 kg)    Physical Exam Vitals and nursing note reviewed.  Constitutional:      General: He is not in acute distress.    Appearance: He is well-developed. He is not diaphoretic.     Comments: Well-appearing, comfortable, cooperative  HENT:     Head: Normocephalic and atraumatic.  Eyes:     General:        Right eye: No discharge.        Left eye: No discharge.     Conjunctiva/sclera: Conjunctivae normal.  Neck:     Thyroid: No thyromegaly.  Cardiovascular:     Rate and Rhythm: Normal rate and regular rhythm.     Pulses: Normal pulses.     Heart sounds: Normal heart sounds. No murmur heard. Pulmonary:     Effort: Pulmonary effort is  normal. No respiratory distress.     Breath sounds: Normal breath sounds. No wheezing or rales.  Musculoskeletal:        General: Normal range of motion.     Cervical back: Normal range of motion and neck supple.  Lymphadenopathy:     Cervical: No cervical adenopathy.  Skin:    General: Skin is warm and dry.     Findings: No erythema or rash.  Neurological:     Mental Status: He is alert and oriented to person, place, and time. Mental status  is at baseline.  Psychiatric:        Behavior: Behavior normal.     Comments: Well groomed, good eye contact, normal speech and thoughts    Results for orders placed or performed during the hospital encounter of 12/20/22  MRSA Next Gen by PCR, Nasal   Specimen: Nasal Mucosa; Nasal Swab  Result Value Ref Range   MRSA by PCR Next Gen NOT DETECTED NOT DETECTED  Basic metabolic panel  Result Value Ref Range   Sodium 139 135 - 145 mmol/L   Potassium 3.6 3.5 - 5.1 mmol/L   Chloride 104 98 - 111 mmol/L   CO2 25 22 - 32 mmol/L   Glucose, Bld 166 (H) 70 - 99 mg/dL   BUN 14 8 - 23 mg/dL   Creatinine, Ser 0.10 0.61 - 1.24 mg/dL   Calcium 8.7 (L) 8.9 - 10.3 mg/dL   GFR, Estimated >93 >23 mL/min   Anion gap 10 5 - 15  CBC  Result Value Ref Range   WBC 8.3 4.0 - 10.5 K/uL   RBC 5.10 4.22 - 5.81 MIL/uL   Hemoglobin 14.9 13.0 - 17.0 g/dL   HCT 55.7 32.2 - 02.5 %   MCV 89.4 80.0 - 100.0 fL   MCH 29.2 26.0 - 34.0 pg   MCHC 32.7 30.0 - 36.0 g/dL   RDW 42.7 06.2 - 37.6 %   Platelets 201 150 - 400 K/uL   nRBC 0.0 0.0 - 0.2 %  Lipase, blood  Result Value Ref Range   Lipase 33 11 - 51 U/L  Hemoglobin A1c  Result Value Ref Range   Hgb A1c MFr Bld 7.0 (H) 4.8 - 5.6 %   Mean Plasma Glucose 154.2 mg/dL  Protime-INR  Result Value Ref Range   Prothrombin Time 15.5 (H) 11.4 - 15.2 seconds   INR 1.2 0.8 - 1.2  APTT  Result Value Ref Range   aPTT 159 (H) 24 - 36 seconds  Lipid panel  Result Value Ref Range   Cholesterol 172 0 - 200 mg/dL    Triglycerides 283 (H) <150 mg/dL   HDL 49 >15 mg/dL   Total CHOL/HDL Ratio 3.5 RATIO   VLDL 33 0 - 40 mg/dL   LDL Cholesterol 90 0 - 99 mg/dL  Ethanol  Result Value Ref Range   Alcohol, Ethyl (B) <10 <10 mg/dL  Basic metabolic panel  Result Value Ref Range   Sodium 135 135 - 145 mmol/L   Potassium 3.2 (L) 3.5 - 5.1 mmol/L   Chloride 103 98 - 111 mmol/L   CO2 23 22 - 32 mmol/L   Glucose, Bld 133 (H) 70 - 99 mg/dL   BUN 17 8 - 23 mg/dL   Creatinine, Ser 1.76 0.61 - 1.24 mg/dL   Calcium 7.8 (L) 8.9 - 10.3 mg/dL   GFR, Estimated >16 >07 mL/min   Anion gap 9 5 - 15  CBC  Result Value Ref Range   WBC 8.5 4.0 - 10.5 K/uL   RBC 4.20 (L) 4.22 - 5.81 MIL/uL   Hemoglobin 12.5 (L) 13.0 - 17.0 g/dL   HCT 37.1 (L) 06.2 - 69.4 %   MCV 88.6 80.0 - 100.0 fL   MCH 29.8 26.0 - 34.0 pg   MCHC 33.6 30.0 - 36.0 g/dL   RDW 85.4 62.7 - 03.5 %   Platelets 158 150 - 400 K/uL   nRBC 0.0 0.0 - 0.2 %  Lipoprotein A (LPA)  Result Value Ref Range   Lipoprotein (  a) 18.5 <75.0 nmol/L  Hepatic function panel  Result Value Ref Range   Total Protein 5.9 (L) 6.5 - 8.1 g/dL   Albumin 3.2 (L) 3.5 - 5.0 g/dL   AST 161 (H) 15 - 41 U/L   ALT 39 0 - 44 U/L   Alkaline Phosphatase 44 38 - 126 U/L   Total Bilirubin 0.8 0.3 - 1.2 mg/dL   Bilirubin, Direct <0.9 0.0 - 0.2 mg/dL   Indirect Bilirubin NOT CALCULATED 0.3 - 0.9 mg/dL  Glucose, capillary  Result Value Ref Range   Glucose-Capillary 146 (H) 70 - 99 mg/dL  Glucose, capillary  Result Value Ref Range   Glucose-Capillary 144 (H) 70 - 99 mg/dL  Glucose, capillary  Result Value Ref Range   Glucose-Capillary 148 (H) 70 - 99 mg/dL  Glucose, capillary  Result Value Ref Range   Glucose-Capillary 168 (H) 70 - 99 mg/dL  Magnesium  Result Value Ref Range   Magnesium 2.0 1.7 - 2.4 mg/dL  Phosphorus  Result Value Ref Range   Phosphorus 3.4 2.5 - 4.6 mg/dL  Basic metabolic panel  Result Value Ref Range   Sodium 138 135 - 145 mmol/L   Potassium 3.9 3.5 -  5.1 mmol/L   Chloride 107 98 - 111 mmol/L   CO2 24 22 - 32 mmol/L   Glucose, Bld 125 (H) 70 - 99 mg/dL   BUN 16 8 - 23 mg/dL   Creatinine, Ser 6.04 0.61 - 1.24 mg/dL   Calcium 8.2 (L) 8.9 - 10.3 mg/dL   GFR, Estimated >54 >09 mL/min   Anion gap 7 5 - 15  Glucose, capillary  Result Value Ref Range   Glucose-Capillary 200 (H) 70 - 99 mg/dL  Glucose, capillary  Result Value Ref Range   Glucose-Capillary 153 (H) 70 - 99 mg/dL  Glucose, capillary  Result Value Ref Range   Glucose-Capillary 134 (H) 70 - 99 mg/dL  Glucose, capillary  Result Value Ref Range   Glucose-Capillary 124 (H) 70 - 99 mg/dL  Glucose, capillary  Result Value Ref Range   Glucose-Capillary 144 (H) 70 - 99 mg/dL  POCT Activated clotting time  Result Value Ref Range   Activated Clotting Time 281 seconds  ECHOCARDIOGRAM COMPLETE  Result Value Ref Range   Weight 2,860.69 oz   Height 66 in   BP 100/75 mmHg   Ao pk vel 1.25 m/s   AR max vel 2.62 cm2   AV Peak grad 6.3 mmHg   Single Plane A2C EF 44.5 %   Single Plane A4C EF 46.1 %   Calc EF 42.8 %   S' Lateral 3.80 cm   Area-P 1/2 3.08 cm2   P 1/2 time 517 msec   Est EF 35 - 40%   Troponin I (High Sensitivity)  Result Value Ref Range   Troponin I (High Sensitivity) 772 (HH) <18 ng/L  Troponin I (High Sensitivity)  Result Value Ref Range   Troponin I (High Sensitivity) >24,000 (HH) <18 ng/L      Assessment & Plan:   Problem List Items Addressed This Visit   None Visit Diagnoses     Coronary artery disease of native artery of native heart with stable angina pectoris (HCC)    -  Primary   Dyspnea, unspecified type       Needs flu shot       Relevant Orders   Flu Vaccine Trivalent High Dose (Fluad)       Assessment and Plan    Recent  Myocardial Infarction Hospitalized on 12/20/2022 for chest pain and diagnosed with a heart attack. Discharged on 12/22/2022. Currently on aspirin, a blood thinner, and cholesterol medication. Followed up with  cardiologist who started Imdur for chest pain. Reports improvement in chest pain but still experiencing shortness of breath and fatigue. -Continue current medications. - ASA, Brillinta, BB coreg, ARB Losartan -Per Cardiology - he is to pick up and start Imdur daily for chest pain as prescribed. -Discontinue nitroglycerin under the tongue and use only as needed once Imdur is started. -Expect improvement in fatigue and shortness of breath over time.  General Health Maintenance -Administer influenza vaccine today. -Consider purchasing an incentive spirometer for lung strengthening exercises, handwritten rx given today       Orders Placed This Encounter  Procedures   Flu Vaccine Trivalent High Dose (Fluad)     No orders of the defined types were placed in this encounter.   Follow up plan: Return if symptoms worsen or fail to improve.   Saralyn Pilar, DO [facility] 12/26/2022, 2:30 PM

## 2022-12-26 NOTE — Patient Instructions (Addendum)
Thank you for coming to the office today.  Flu Shot today  Keep on current medications.  Fatigue and breathing will improve with time.  Cardiologist started new medication Imdur 15mg  daily for chest pain. Once you start taking this one, stop the Nitroglycerin under the tongue and ONLY USE AS NEEDED  ----------------------  Incentive Spirometer to help improve breathing - lung strengthening exercise.  [facility] [address] [place], [place] [address] Open until 5PM Phone: [phone] Fax: [phone]  [facility] [address] [place], [place]  [address] Ph: [phone] Fax: [phone]  [facility]. [address] [place], [place] [address] Ph: [phone] Fax: [phone]  Please schedule a Follow-up Appointment to: Return if symptoms worsen or fail to improve.  If you have any other questions or concerns, please feel free to call the office or send a message through MyChart. You may also schedule an earlier appointment if necessary.  Additionally, you may be receiving a survey about your experience at our office within a few days to 1 week by e-mail or mail. We value your feedback.  Saralyn Pilar, DO [facility], [place]

## 2022-12-29 ENCOUNTER — Ambulatory Visit: Payer: 59 | Admitting: Student

## 2022-12-30 ENCOUNTER — Telehealth: Payer: Self-pay

## 2022-12-30 ENCOUNTER — Other Ambulatory Visit: Payer: Self-pay

## 2022-12-30 NOTE — Patient Outreach (Signed)
Care Management  Transitions of Care Program Transitions of Care Post-discharge week 2   12/30/2022 Name: Ross Riley MRN: [id number] DOB: Sep 30, 1950  Subjective: Ross Riley is a 72 y.o. year old male who is a primary care patient of Smitty Cords, DO. The Care Management team Engaged with patient Engaged with patient by telephone to assess and address transitions of care needs.   Consent to Services:  Patient was given information about care management services, agreed to services, and gave verbal consent to participate.   Assessment:   Patient voices no new complaints Patient has not developed/ reported any new Medical issues / Dx or acute changes.- since last follow-up call for most recent  Hospital stay   10/19-10/21 / 2024  Spoke with patients daughter [name], They initially declined TOC but have agreed to the program. He was seem by Cardiology and PCP last week. Med changes noted. He picked up an Facilities manager . He has ess LE edema but still reports some SOB. He is pacing his activities , taking medication as ordered.Has BG monitoring strips  and has begun  to regularly check blood glucose  He received Flu vaccine last week. Patient educated on red flags s/s to watch for and was encouraged to report any of these identified , any new symptoms , changes in baseline or  medication regimen,  change in health status  /  well-being, or safety concerns to PCP and / or the  [facility] Case Management team .          SDOH Interventions    Flowsheet Row Telephone from 12/30/2022 in [place] POPULATION HEALTH DEPARTMENT Office Visit from 10/24/2022 in [facility] Clinical Support from 08/07/2022 in [facility] Office Visit from 06/23/2022 in [facility] Office Visit from 04/25/2022 in [facility] Office Visit from 08/12/2017 in [facility]   SDOH Interventions        Food Insecurity Interventions Intervention Not Indicated -- Intervention Not Indicated -- -- --  Housing Interventions Intervention Not Indicated -- Intervention Not Indicated -- -- --  Transportation Interventions Patient Declined, Patient Resources (Friends/Family) -- Intervention Not Indicated, Patient Resources (Friends/Family) -- -- --  Utilities Interventions -- -- Intervention Not Indicated -- -- --  Alcohol Usage Interventions -- -- Intervention Not Indicated (Score <7) Intervention Not Indicated (Score <7) -- --  Depression Interventions/Treatment  -- Counseling -- -- Patient refuses Treatment Patient refuses Treatment  Financial Strain Interventions -- -- Intervention Not Indicated -- -- --  Physical Activity Interventions -- -- Intervention Not Indicated -- -- --  Stress Interventions -- -- Intervention Not Indicated -- -- --  Social Connections Interventions -- -- Intervention Not Indicated -- -- --        Goals Addressed             This Visit's Progress    TOC Care Plan       Current Barriers:  Medication management Scheduled and PRN medications  Provider appointments scheduling and attending appt for changes in condition and routine follow-up  RNCM Clinical Goal(s):  Patient will work with the Care Management team over the next 30 days to address Transition of Care Barriers: Medication Management Support at home Provider appointments take all medications exactly as prescribed and will call provider for medication related questions as evidenced by no missed medications , has all prescribed medications available attend  all scheduled medical appointments: with PCP, and Specialists as evidenced by no missed appointments demonstrate ongoing self health care management ability with medication management  as evidenced by calling Provider for changes, managing symptoms   through collaboration with RN Care manager, provider, and care team.    Interventions: Evaluation of current treatment plan related to  self management and patient's adherence to plan as established by provider  Transitions of Care:  New goal. Doctor Visits  - discussed the importance of doctor visits  Patient Goals/Self-Care Activities: Participate in Transition of Care Program/Attend [facility] scheduled calls Take all medications as prescribed Attend all scheduled provider appointments Call pharmacy for medication refills 3-7 days in advance of running out of medications Perform all self care activities independently  Perform IADL's (shopping, preparing meals, housekeeping, managing finances) independently Call provider office for new concerns or questions   Follow Up Plan:  Telephone follow up appointment with care management team member scheduled for:  01/08/23 12:00pm The patient has been provided with contact information for the care management team and has been advised to call with any health related questions or concerns.          Plan: The patient has been provided with contact information for the care management team and has been advised to call with any health related questions or concerns.  Routine follow-up and on-going assessment evaluation and education of disease processes, recommended interventions for both chronic and acute medical conditions , will occur during each weekly visit along with ongoing review of symptoms ,medication reviews and reconciliation. Any updates , inconsistencies, discrepancies or acute care concerns will be addressed and routed to the correct Practitioner if indicated   Please refer to Care Plan for goals and interventions -Effectiveness of interventions, symptom management and outcomes will be evaluated  weekly during [facility] 30-day Program Outreach calls  . Any necessary  changes and updates to Care Plan will be completed episodically    Reviewed goals for care  Patient provided with Contact information and verbalized  understanding with current POC.  Susa Loffler , BSN, RN Care Management Coordinator [place]   [facility] : [facility] [email or web address] Direct Dial: [phone]

## 2022-12-31 DIAGNOSIS — L538 Other specified erythematous conditions: Secondary | ICD-10-CM | POA: Diagnosis not present

## 2022-12-31 DIAGNOSIS — L728 Other follicular cysts of the skin and subcutaneous tissue: Secondary | ICD-10-CM | POA: Diagnosis not present

## 2022-12-31 DIAGNOSIS — D225 Melanocytic nevi of trunk: Secondary | ICD-10-CM | POA: Diagnosis not present

## 2022-12-31 DIAGNOSIS — R208 Other disturbances of skin sensation: Secondary | ICD-10-CM | POA: Diagnosis not present

## 2022-12-31 DIAGNOSIS — L820 Inflamed seborrheic keratosis: Secondary | ICD-10-CM | POA: Diagnosis not present

## 2022-12-31 DIAGNOSIS — D485 Neoplasm of uncertain behavior of skin: Secondary | ICD-10-CM | POA: Diagnosis not present

## 2022-12-31 DIAGNOSIS — L918 Other hypertrophic disorders of the skin: Secondary | ICD-10-CM | POA: Diagnosis not present

## 2023-01-08 ENCOUNTER — Telehealth: Payer: Self-pay | Admitting: Family Medicine

## 2023-01-08 ENCOUNTER — Other Ambulatory Visit: Payer: Self-pay

## 2023-01-08 DIAGNOSIS — R0600 Dyspnea, unspecified: Secondary | ICD-10-CM

## 2023-01-08 DIAGNOSIS — I252 Old myocardial infarction: Secondary | ICD-10-CM

## 2023-01-08 DIAGNOSIS — I25118 Atherosclerotic heart disease of native coronary artery with other forms of angina pectoris: Secondary | ICD-10-CM

## 2023-01-08 NOTE — Patient Outreach (Signed)
Care Management  Transitions of Care Program Transitions of Care Post-discharge week 3   01/08/2023 Name: Ross Riley MRN: [id number] DOB: [birth year]  Subjective: Ross Riley is a 72 y.o. year old male who is a primary care patient of Ross Cords, DO. The Care Management team Engaged with patient Engaged with patient by telephone to assess and address transitions of care needs.   Consent to Services:  Patient was given information about care management services, agreed to services, and gave verbal consent to participate.   Assessment:   Patient voices no new complaints Patient has not developed/ reported any new Medical issues / Dx or acute changes.- since last follow-up call for most recent  Hospital stay      10/19-10/21  / 2024  Spoke with daughter Ross Riley with patient present. He is feeling better although he still reports SOB and DOE. He is using his Facilities manager. He has poor endurance. He has decreased edema and no weight gain weigh this am 171 ( previously 181). He reports he does elevate is LE when sitting  He is checking his BG this am 120 Reviewed process for checking with Daughter.  Reviewed refilling prescriptions with daughter and recommended they request auto-fill Pharmacy is [facility]  Patient educated on red flags s/s to watch for and was encouraged to report any of these identified , any new symptoms , changes in baseline or  medication regimen,  change in health status  /  well-being, or safety concerns to PCP and / or the  [facility]BCI Case Management team .          SDOH Interventions    Flowsheet Row Patient Outreach from 01/08/2023 in [place] [facility] DEPARTMENT Telephone from 12/30/2022 in [place] [facility] DEPARTMENT Office Visit from 10/24/2022 in [facility] Clinical Support from 08/07/2022 in [facility] Office Visit from 06/23/2022 in [facility] Office Visit from 04/25/2022 in [facility]  SDOH Interventions        Food Insecurity Interventions Intervention Not Indicated Intervention Not Indicated -- Intervention Not Indicated -- --  Housing Interventions Intervention Not Indicated Intervention Not Indicated -- Intervention Not Indicated -- --  Transportation Interventions Intervention Not Indicated, Patient Resources (Friends/Family) Patient Declined, Patient Resources (Friends/Family) -- Intervention Not Indicated, Patient Resources Dietitian) -- --  Utilities Interventions Intervention Not Indicated -- -- Intervention Not Indicated -- --  Alcohol Usage Interventions -- -- -- Intervention Not Indicated (Score <7) Intervention Not Indicated (Score <7) --  Depression Interventions/Treatment  -- -- Counseling -- -- Patient refuses Treatment  Financial Strain Interventions -- -- -- Intervention Not Indicated -- --  Physical Activity Interventions -- -- -- Intervention Not Indicated -- --  Stress Interventions -- -- -- Intervention Not Indicated -- --  Social Connections Interventions -- -- -- Intervention Not Indicated -- --        Goals Addressed             This Visit's Progress    TOC Care Plan       Current Barriers:  Medication management Scheduled and PRN medications  Provider appointments scheduling and attending appt for changes in condition and routine follow-up Home Health services Requested [facility] Nursing and PT Functional/Safety SOB and unsteady amb in home Language Barrier  RNCM Clinical Goal(s):  Patient will work with the Care Management team over the next 30 days to address Transition  of Care Barriers: Medication Management Support at home Provider appointments take all medications exactly as prescribed and will call provider for medication related questions as evidenced by no missed medications , has all prescribed medications available attend all scheduled medical  appointments: with PCP, and Specialists as evidenced by no missed appointments demonstrate ongoing self health care management ability with medication management  as evidenced by calling Provider for changes, managing symptoms   through collaboration with RN Care manager, provider, and care team.   Interventions: Evaluation of current treatment plan related to  self management and patient's adherence to plan as established by provider  Transitions of Care:  Goal on track:  Yes. Doctor Visits  - discussed the importance of doctor visits  Patient Goals/Self-Care Activities: Participate in Transition of Care Program/Attend [facility] scheduled calls Take all medications as prescribed Attend all scheduled provider appointments Call pharmacy for medication refills 3-7 days in advance of running out of medications Perform all self care activities independently  Perform IADL's (shopping, preparing meals, housekeeping, managing finances) independently Call provider office for new concerns or questions   Follow Up Plan:  Telephone follow up appointment with care management team member scheduled for:  01/16/23 11:30am The patient has been provided with contact information for the care management team and has been advised to call with any health related questions or concerns.          Plan:  Called PCP office and requested [facility] referral for Nursing and PT, OT if other disciplines feel it is needed  Spoke with Ross Riley who will send message to PCP  Routine follow-up and on-going assessment evaluation and education of disease processes, recommended interventions for both chronic and acute medical conditions , will occur during each weekly visit along with ongoing review of symptoms ,medication reviews and reconciliation. Any updates , inconsistencies, discrepancies or acute care concerns will be addressed and routed to the correct Practitioner if indicated   Please refer to Care Plan for goals and  interventions -Effectiveness of interventions, symptom management and outcomes will be evaluated  weekly during [facility] 30-day Program Outreach calls  . Any necessary  changes and updates to Care Plan will be completed episodically    Reviewed goals for care  Patient provided with Contact information and verbalized understanding with current POC.  The patient has been provided with contact information for the care management team and has been advised to call with any health related questions or concerns.    Susa Loffler , BSN, RN Care Management Coordinator [place]   [facility] [email or web address] Direct Dial: [phone]

## 2023-01-08 NOTE — Telephone Encounter (Signed)
Referral to [facility] RN PT OT ordered.  Following NSTEMI History, CAD, with Dyspnea.  Last office visit with me 12/26/22  Saralyn Pilar, DO [facility] 01/08/2023, 2:06 PM

## 2023-01-08 NOTE — Telephone Encounter (Signed)
Christy with [facility] called asking if pt could get home health nurse and Pt  Pt gets winded easy  strength and endurance is not good.  CB#  [phone]

## 2023-01-11 DIAGNOSIS — D649 Anemia, unspecified: Secondary | ICD-10-CM | POA: Diagnosis not present

## 2023-01-11 DIAGNOSIS — H8113 Benign paroxysmal vertigo, bilateral: Secondary | ICD-10-CM | POA: Diagnosis not present

## 2023-01-11 DIAGNOSIS — K5790 Diverticulosis of intestine, part unspecified, without perforation or abscess without bleeding: Secondary | ICD-10-CM | POA: Diagnosis not present

## 2023-01-11 DIAGNOSIS — I25118 Atherosclerotic heart disease of native coronary artery with other forms of angina pectoris: Secondary | ICD-10-CM | POA: Diagnosis not present

## 2023-01-11 DIAGNOSIS — E1136 Type 2 diabetes mellitus with diabetic cataract: Secondary | ICD-10-CM | POA: Diagnosis not present

## 2023-01-11 DIAGNOSIS — H409 Unspecified glaucoma: Secondary | ICD-10-CM | POA: Diagnosis not present

## 2023-01-11 DIAGNOSIS — I255 Ischemic cardiomyopathy: Secondary | ICD-10-CM | POA: Diagnosis not present

## 2023-01-11 DIAGNOSIS — E039 Hypothyroidism, unspecified: Secondary | ICD-10-CM | POA: Diagnosis not present

## 2023-01-11 DIAGNOSIS — I10 Essential (primary) hypertension: Secondary | ICD-10-CM | POA: Diagnosis not present

## 2023-01-11 DIAGNOSIS — E1169 Type 2 diabetes mellitus with other specified complication: Secondary | ICD-10-CM | POA: Diagnosis not present

## 2023-01-11 DIAGNOSIS — G8929 Other chronic pain: Secondary | ICD-10-CM | POA: Diagnosis not present

## 2023-01-11 DIAGNOSIS — M25511 Pain in right shoulder: Secondary | ICD-10-CM | POA: Diagnosis not present

## 2023-01-11 DIAGNOSIS — Z7982 Long term (current) use of aspirin: Secondary | ICD-10-CM | POA: Diagnosis not present

## 2023-01-11 DIAGNOSIS — G248 Other dystonia: Secondary | ICD-10-CM | POA: Diagnosis not present

## 2023-01-11 DIAGNOSIS — K5909 Other constipation: Secondary | ICD-10-CM | POA: Diagnosis not present

## 2023-01-11 DIAGNOSIS — K219 Gastro-esophageal reflux disease without esophagitis: Secondary | ICD-10-CM | POA: Diagnosis not present

## 2023-01-11 DIAGNOSIS — Z7984 Long term (current) use of oral hypoglycemic drugs: Secondary | ICD-10-CM | POA: Diagnosis not present

## 2023-01-11 DIAGNOSIS — I2102 ST elevation (STEMI) myocardial infarction involving left anterior descending coronary artery: Secondary | ICD-10-CM | POA: Diagnosis not present

## 2023-01-11 DIAGNOSIS — E785 Hyperlipidemia, unspecified: Secondary | ICD-10-CM | POA: Diagnosis not present

## 2023-01-11 DIAGNOSIS — M159 Polyosteoarthritis, unspecified: Secondary | ICD-10-CM | POA: Diagnosis not present

## 2023-01-12 ENCOUNTER — Telehealth: Payer: Self-pay | Admitting: Family Medicine

## 2023-01-12 DIAGNOSIS — I255 Ischemic cardiomyopathy: Secondary | ICD-10-CM | POA: Diagnosis not present

## 2023-01-12 DIAGNOSIS — I2102 ST elevation (STEMI) myocardial infarction involving left anterior descending coronary artery: Secondary | ICD-10-CM | POA: Diagnosis not present

## 2023-01-12 DIAGNOSIS — G248 Other dystonia: Secondary | ICD-10-CM | POA: Diagnosis not present

## 2023-01-12 DIAGNOSIS — E1169 Type 2 diabetes mellitus with other specified complication: Secondary | ICD-10-CM | POA: Diagnosis not present

## 2023-01-12 DIAGNOSIS — I25118 Atherosclerotic heart disease of native coronary artery with other forms of angina pectoris: Secondary | ICD-10-CM | POA: Diagnosis not present

## 2023-01-12 DIAGNOSIS — E785 Hyperlipidemia, unspecified: Secondary | ICD-10-CM | POA: Diagnosis not present

## 2023-01-12 DIAGNOSIS — D649 Anemia, unspecified: Secondary | ICD-10-CM | POA: Diagnosis not present

## 2023-01-12 DIAGNOSIS — K5792 Diverticulitis of intestine, part unspecified, without perforation or abscess without bleeding: Secondary | ICD-10-CM

## 2023-01-12 DIAGNOSIS — K5790 Diverticulosis of intestine, part unspecified, without perforation or abscess without bleeding: Secondary | ICD-10-CM | POA: Diagnosis not present

## 2023-01-12 DIAGNOSIS — H8113 Benign paroxysmal vertigo, bilateral: Secondary | ICD-10-CM | POA: Diagnosis not present

## 2023-01-12 DIAGNOSIS — M159 Polyosteoarthritis, unspecified: Secondary | ICD-10-CM | POA: Diagnosis not present

## 2023-01-12 DIAGNOSIS — Z7982 Long term (current) use of aspirin: Secondary | ICD-10-CM | POA: Diagnosis not present

## 2023-01-12 DIAGNOSIS — E1136 Type 2 diabetes mellitus with diabetic cataract: Secondary | ICD-10-CM | POA: Diagnosis not present

## 2023-01-12 DIAGNOSIS — H409 Unspecified glaucoma: Secondary | ICD-10-CM | POA: Diagnosis not present

## 2023-01-12 DIAGNOSIS — K219 Gastro-esophageal reflux disease without esophagitis: Secondary | ICD-10-CM | POA: Diagnosis not present

## 2023-01-12 DIAGNOSIS — I10 Essential (primary) hypertension: Secondary | ICD-10-CM | POA: Diagnosis not present

## 2023-01-12 DIAGNOSIS — E039 Hypothyroidism, unspecified: Secondary | ICD-10-CM | POA: Diagnosis not present

## 2023-01-12 DIAGNOSIS — R1032 Left lower quadrant pain: Secondary | ICD-10-CM

## 2023-01-12 DIAGNOSIS — Z8719 Personal history of other diseases of the digestive system: Secondary | ICD-10-CM

## 2023-01-12 DIAGNOSIS — Z7984 Long term (current) use of oral hypoglycemic drugs: Secondary | ICD-10-CM | POA: Diagnosis not present

## 2023-01-12 DIAGNOSIS — K5909 Other constipation: Secondary | ICD-10-CM | POA: Diagnosis not present

## 2023-01-12 DIAGNOSIS — M25511 Pain in right shoulder: Secondary | ICD-10-CM | POA: Diagnosis not present

## 2023-01-12 DIAGNOSIS — G8929 Other chronic pain: Secondary | ICD-10-CM | POA: Diagnosis not present

## 2023-01-12 MED ORDER — DICYCLOMINE HCL 10 MG PO CAPS: 10.0000 mg | ORAL_CAPSULE | Freq: Three times a day (TID) | ORAL | 2 refills | Status: DC

## 2023-01-12 NOTE — Telephone Encounter (Signed)
Pam RN calling from [facility] is calling to report that the dicyclomine (BENTYL) 10 MG capsule [[id number]] is not in the home. Does Dr. Kirtland Bouchard want the patient to continue on this medication CB- [phone]

## 2023-01-12 NOTE — Telephone Encounter (Signed)
Verbal given to Pam to continue therapy.

## 2023-01-12 NOTE — Telephone Encounter (Signed)
Spoke with Elita Quick and updated med list for patient.

## 2023-01-12 NOTE — Telephone Encounter (Signed)
Pam RN calling from [facility] is also asking should the patient continue taking Rx #: [id number] atorvastatin (LIPITOR) 80 MG tablet [[id number]]  DISCONTINUED Please advise CB- [phone]

## 2023-01-12 NOTE — Telephone Encounter (Signed)
Pam RN calling from [facility] calling to request skilled nursing for heart disease and medication management  Frequency- 1 W 4 2 Month 1 2 PRNS  [phone]

## 2023-01-12 NOTE — Telephone Encounter (Signed)
Please notify [facility] back with my responses  Dicyclomine - is used for as needed abdominal cramping symptom relief. It is only AS NEEDED. I can re order it. But he does not need to take it every day. Only abdominal cramping bloating urgency.  2.  Atorvastatin - He should be taking Atorvastatin 80mg  daily for protecting his heart. He was on a lower dosage before the hospital. They discontinued the low dose, and recommended he keep taking the higher dose 80mg . Please resume Atorvastatin 80mg  dose. If he needs more refills should contact pharmacy or heart doctors office.  Saralyn Pilar, DO [facility] 01/12/2023, 4:11 PM

## 2023-01-12 NOTE — Addendum Note (Signed)
Addended by: Judd Gaudier on: 01/12/2023 04:19 PM   Modules accepted: Orders

## 2023-01-12 NOTE — Telephone Encounter (Signed)
Okay to proceed w/ verbal orders.  There are 2 phone notes for call back request to [facility] for Pam RN  The other call is about medication questions.  Saralyn Pilar, DO [facility] 01/12/2023, 4:12 PM

## 2023-01-13 ENCOUNTER — Telehealth: Payer: Self-pay | Admitting: Family Medicine

## 2023-01-13 NOTE — Telephone Encounter (Signed)
Okay to proceed w/ verbal orders for PT  Saralyn Pilar, DO [facility] 01/13/2023, 4:47 PM

## 2023-01-13 NOTE — Telephone Encounter (Signed)
Home Health Verbal Orders - Caller/Agency: Phu, PT, with [facility]  Callback Number: [phone] (Secured VM)  Service Requested: Physical Therapy  Frequency: 1 week 9  Any new concerns about the patient? No

## 2023-01-14 NOTE — Telephone Encounter (Signed)
Verbal given to [place].

## 2023-01-15 DIAGNOSIS — I255 Ischemic cardiomyopathy: Secondary | ICD-10-CM | POA: Diagnosis not present

## 2023-01-15 DIAGNOSIS — E1136 Type 2 diabetes mellitus with diabetic cataract: Secondary | ICD-10-CM | POA: Diagnosis not present

## 2023-01-15 DIAGNOSIS — M159 Polyosteoarthritis, unspecified: Secondary | ICD-10-CM | POA: Diagnosis not present

## 2023-01-15 DIAGNOSIS — D649 Anemia, unspecified: Secondary | ICD-10-CM | POA: Diagnosis not present

## 2023-01-15 DIAGNOSIS — G248 Other dystonia: Secondary | ICD-10-CM | POA: Diagnosis not present

## 2023-01-15 DIAGNOSIS — Z7984 Long term (current) use of oral hypoglycemic drugs: Secondary | ICD-10-CM | POA: Diagnosis not present

## 2023-01-15 DIAGNOSIS — K219 Gastro-esophageal reflux disease without esophagitis: Secondary | ICD-10-CM | POA: Diagnosis not present

## 2023-01-15 DIAGNOSIS — M25511 Pain in right shoulder: Secondary | ICD-10-CM | POA: Diagnosis not present

## 2023-01-15 DIAGNOSIS — I25118 Atherosclerotic heart disease of native coronary artery with other forms of angina pectoris: Secondary | ICD-10-CM | POA: Diagnosis not present

## 2023-01-15 DIAGNOSIS — I2102 ST elevation (STEMI) myocardial infarction involving left anterior descending coronary artery: Secondary | ICD-10-CM | POA: Diagnosis not present

## 2023-01-15 DIAGNOSIS — E039 Hypothyroidism, unspecified: Secondary | ICD-10-CM | POA: Diagnosis not present

## 2023-01-15 DIAGNOSIS — H8113 Benign paroxysmal vertigo, bilateral: Secondary | ICD-10-CM | POA: Diagnosis not present

## 2023-01-15 DIAGNOSIS — E785 Hyperlipidemia, unspecified: Secondary | ICD-10-CM | POA: Diagnosis not present

## 2023-01-15 DIAGNOSIS — K5790 Diverticulosis of intestine, part unspecified, without perforation or abscess without bleeding: Secondary | ICD-10-CM | POA: Diagnosis not present

## 2023-01-15 DIAGNOSIS — Z7982 Long term (current) use of aspirin: Secondary | ICD-10-CM | POA: Diagnosis not present

## 2023-01-15 DIAGNOSIS — K5909 Other constipation: Secondary | ICD-10-CM | POA: Diagnosis not present

## 2023-01-15 DIAGNOSIS — H409 Unspecified glaucoma: Secondary | ICD-10-CM | POA: Diagnosis not present

## 2023-01-15 DIAGNOSIS — E1169 Type 2 diabetes mellitus with other specified complication: Secondary | ICD-10-CM | POA: Diagnosis not present

## 2023-01-15 DIAGNOSIS — I10 Essential (primary) hypertension: Secondary | ICD-10-CM | POA: Diagnosis not present

## 2023-01-15 DIAGNOSIS — G8929 Other chronic pain: Secondary | ICD-10-CM | POA: Diagnosis not present

## 2023-01-16 ENCOUNTER — Other Ambulatory Visit: Payer: Self-pay

## 2023-01-16 ENCOUNTER — Telehealth: Payer: Self-pay | Admitting: Family Medicine

## 2023-01-16 NOTE — Telephone Encounter (Signed)
Verbal given 

## 2023-01-16 NOTE — Telephone Encounter (Signed)
Home Health Verbal Orders - Caller/Agency: Junious Dresser OT - [facility]   Callback Number: [phone]  Service Requested: Occupational Therapy  Frequency: 1w8   Any new concerns about the patient? No

## 2023-01-16 NOTE — Patient Outreach (Addendum)
Care Management  Transitions of Care Program Transitions of Care Post-discharge week 4  01/16/2023 Name: AZARION NORBY MRN: [id number] DOB: 23-Oct-1950  Subjective: ATTILA CUSIMANO is a 72 y.o. year old male who is a primary care patient of Smitty Cords, DO. The Care Management team spoke with the family member (POA, Guardian, [place]) to assess and address transitions of care needs.    Patient voices no new complaints Patient has not developed/ reported any new Medical issues / Dx or acute changes.- since last follow-up call for most recent  Hospital stay   10/19-10/21/ 2024  He is doing a little better. Is being followed by [facility] with Nursing and PT. Recent med clarification,Reviewed and clarified medications w/ patient and daughter Ala. He still reports some SOB with exertion but states it's decreased and he is able to amb further. He has several appt next week but overall is improved  Patient educated on red flags s/s to watch for and was encouraged to report any of these identified , any new symptoms , changes in baseline or  medication regimen,  change in health status  /  well-being, or safety concerns to PCP and / or the  VBCI Case Management team .    Goals Addressed             This Visit's Progress    TOC Care Plan       Current Barriers:  Medication management Scheduled and PRN medications  Provider appointments scheduling and attending appt for changes in condition and routine follow-up Home Health services [facility] Nursing and PT- [facility] Nursing and PT started Functional/Safety SOB and unsteady amb in home Language Barrier  RNCM Clinical Goal(s):  Patient will work with the Care Management team over the next 30 days to address Transition of Care Barriers: Medication Management Support at home Provider appointments take all medications exactly as prescribed and will call provider for medication related questions as evidenced by no missed  medications , has all prescribed medications available attend all scheduled medical appointments: with PCP, and Specialists as evidenced by no missed appointments demonstrate ongoing self health care management ability with medication management  as evidenced by calling Provider for changes, managing symptoms   through collaboration with RN Care manager, provider, and care team.   Interventions: Evaluation of current treatment plan related to  self management and patient's adherence to plan as established by provider  Transitions of Care:  Goal on track:  Yes. Doctor Visits  - discussed the importance of doctor visits  Patient Goals/Self-Care Activities: Participate in Transition of Care Program/Attend [facility] scheduled calls Take all medications as prescribed Attend all scheduled provider appointments Call pharmacy for medication refills 3-7 days in advance of running out of medications Perform all self care activities independently  Perform IADL's (shopping, preparing meals, housekeeping, managing finances) independently Call provider office for new concerns or questions   Follow Up Plan:  Telephone follow up appointment with care management team member scheduled for:  01/21/23 12:00pm-   The patient has been provided with contact information for the care management team and has been advised to call with any health related questions or concerns.           Plan:  Routine follow-up and on-going assessment evaluation and education of disease processes, recommended interventions for both chronic and acute medical conditions , will occur during each weekly visit along with ongoing review of symptoms ,medication reviews and reconciliation. Any updates , inconsistencies, discrepancies  or acute care concerns will be addressed and routed to the correct Practitioner if indicated   Please refer to Care Plan for goals and interventions -Effectiveness of interventions, symptom management and outcomes  will be evaluated  weekly during [facility] 30-day Program Outreach calls  . Any necessary  changes and updates to Care Plan will be completed episodically    Reviewed goals for care  Patient provided with Contact information and verbalized understanding with current POC.    Susa Loffler , BSN, RN Care Management Coordinator [place]   [facility] [email or web address] Direct Dial: [phone]

## 2023-01-19 ENCOUNTER — Other Ambulatory Visit: Payer: Self-pay

## 2023-01-19 DIAGNOSIS — K5909 Other constipation: Secondary | ICD-10-CM | POA: Diagnosis not present

## 2023-01-19 DIAGNOSIS — I255 Ischemic cardiomyopathy: Secondary | ICD-10-CM | POA: Diagnosis not present

## 2023-01-19 DIAGNOSIS — G248 Other dystonia: Secondary | ICD-10-CM | POA: Diagnosis not present

## 2023-01-19 DIAGNOSIS — Z7984 Long term (current) use of oral hypoglycemic drugs: Secondary | ICD-10-CM | POA: Diagnosis not present

## 2023-01-19 DIAGNOSIS — K5790 Diverticulosis of intestine, part unspecified, without perforation or abscess without bleeding: Secondary | ICD-10-CM | POA: Diagnosis not present

## 2023-01-19 DIAGNOSIS — E1169 Type 2 diabetes mellitus with other specified complication: Secondary | ICD-10-CM | POA: Diagnosis not present

## 2023-01-19 DIAGNOSIS — I25118 Atherosclerotic heart disease of native coronary artery with other forms of angina pectoris: Secondary | ICD-10-CM | POA: Diagnosis not present

## 2023-01-19 DIAGNOSIS — E039 Hypothyroidism, unspecified: Secondary | ICD-10-CM | POA: Diagnosis not present

## 2023-01-19 DIAGNOSIS — I10 Essential (primary) hypertension: Secondary | ICD-10-CM | POA: Diagnosis not present

## 2023-01-19 DIAGNOSIS — E785 Hyperlipidemia, unspecified: Secondary | ICD-10-CM | POA: Diagnosis not present

## 2023-01-19 DIAGNOSIS — G8929 Other chronic pain: Secondary | ICD-10-CM | POA: Diagnosis not present

## 2023-01-19 DIAGNOSIS — D649 Anemia, unspecified: Secondary | ICD-10-CM | POA: Diagnosis not present

## 2023-01-19 DIAGNOSIS — K219 Gastro-esophageal reflux disease without esophagitis: Secondary | ICD-10-CM | POA: Diagnosis not present

## 2023-01-19 DIAGNOSIS — M25511 Pain in right shoulder: Secondary | ICD-10-CM | POA: Diagnosis not present

## 2023-01-19 DIAGNOSIS — H8113 Benign paroxysmal vertigo, bilateral: Secondary | ICD-10-CM | POA: Diagnosis not present

## 2023-01-19 DIAGNOSIS — Z7982 Long term (current) use of aspirin: Secondary | ICD-10-CM | POA: Diagnosis not present

## 2023-01-19 DIAGNOSIS — H409 Unspecified glaucoma: Secondary | ICD-10-CM | POA: Diagnosis not present

## 2023-01-19 DIAGNOSIS — I2102 ST elevation (STEMI) myocardial infarction involving left anterior descending coronary artery: Secondary | ICD-10-CM | POA: Diagnosis not present

## 2023-01-19 DIAGNOSIS — E1136 Type 2 diabetes mellitus with diabetic cataract: Secondary | ICD-10-CM | POA: Diagnosis not present

## 2023-01-19 DIAGNOSIS — M159 Polyosteoarthritis, unspecified: Secondary | ICD-10-CM | POA: Diagnosis not present

## 2023-01-20 ENCOUNTER — Other Ambulatory Visit: Payer: Self-pay

## 2023-01-20 NOTE — Progress Notes (Signed)
Cardiology Clinic Note   Date: 01/23/2023 ID: Ross Riley, Ross Riley 08-29-1950, MRN [id number]  Primary Cardiologist:  Julien Nordmann, MD  Patient Profile    Ross Riley is a 72 y.o. male who presents to the clinic today for follow up.     Past medical history significant for: CAD. Coronary CTA with FFR 11/18/2021: Coronary calcium score 265 (61st percentile).  Moderate stenosis of proximal LAD.  Mild stenosis proximal RCA.  FFR did not show significant stenosis. LHC 12/20/2022 (STEMI): Proximal to mid LAD 100%.  Distal LAD 60%.  PCI with DES 3.5 x 34 mm to proximal and mid LAD.  D1 and D2 jailed by stent with sluggish flow in D2 bifurcates too small to rescue. Chronic HFrEF/Ischemic cardiomyopathy. Echo 12/20/2022: EF 35 to 40%.  Akinesis of the left ventricle entire anteroseptal wall and apical segments.  Mild LVH.  Normal RV function. Hypertension. Hyperlipidemia. Lipid panel 12/20/2022: LDL 90, HDL 49, TG 166, total 172. LPa 12/21/2022: 18.5. T2DM. Hypothyroidism. EtOH abuse.   In summary, patient was first evaluated by Dr. Mariah Milling on 11/05/2021 for chest pain and shortness of breath at the request of Dr. Althea Charon. He was seen in the emergency room in May 2023 for substernal chest pain. Troponin negative, EKG non-acute. Given GI cocktail and discharged on PPI.  Coronary CTA showed calcium score of 265 with moderate stenosis in proximal LAD mild stenosis of the proximal RCA, FFR did not show significant stenosis.   Patient presented to the ED via EMS on 12/20/2022 with complaints of left-sided chest pain.  EMS gave aspirin 324 mg but patient vomited shortly after.  Initial EKG demonstrated inferior T wave inversions.  Repeat EKG was consistent with STEMI.  Troponin elevated to 700.  Patient taken emergently to the Cath Lab and underwent PCI with DES to proximal to mid LAD.  Patient continued to complain of chest pain throughout hospital stay.  Concern for alcohol withdrawal  secondary to patient's excessive alcohol use. Patient discharged 12/22/2022 on aspirin, Brilinta, atorvastatin, Jardiance, losartan.     History of Present Illness    Ross Riley is followed by Dr. Mariah Milling for the above outlined history.   Patient was last seen in the office by me on 12/24/2022 for chest pain and shortness of breath. His caseworker had contacted the office requesting a visit for weight gain. Patient reported continued left sided chest pain and shortness of breath since discharge from hospital. He reports pain is the same as what brought him to the hospital and is unchanged since discharge. It does not occur with exertion and resolves on its own. It is associated with shortness of breath. He did not receive NTG at the time of discharge. Patient also reports edema to bilateral feet with 3 lb weight gain compared to pre-hospital admission weight. Normal weight 168-170 lb. He had no edema on exam. Patient was prescribed isosorbide 15 mg daily and prn NTG.    Discussed the use of AI scribe software for clinical note transcription with the patient, who gave verbal consent to proceed. The patient presents accompanied by his wife for follow-up. He reports he had been doing well for a couple of weeks without episodes of chest pain until yesterday when he had an episode of chest pain that required him to take NTG x 3 with complete resolution of pain. Upon further questioning, patient reported the label for Imdur has directions for as needed dosing so he has not been taking it daily. He  does report some fatigue. He has been working with home health PT with good tolerance. He is interested in starting cardiac rehab. Patient requests information as to why he is taking each of his medication. Discussed each medication and reason for taking in detail. He has not had any further lower extremity edema.         ROS: All other systems reviewed and are otherwise negative except as noted in History  of Present Illness.  Studies Reviewed    EKG Interpretation Date/Time:  Friday January 23 2023 15:08:42 EST Ventricular Rate:  63 PR Interval:  100 QRS Duration:  86 QT Interval:  464 QTC Calculation: 474 R Axis:   -63  Text Interpretation: Sinus rhythm with short PR Left axis deviation Pulmonary disease pattern T wave abnormality, consider anterolateral ischemia When compared with ECG of 24-Dec-2022 15:28, Nonspecific T wave abnormality no longer evident in Inferior leads T wave inversion less evident in Lateral leads Confirmed by Carlos Levering [phone]) on 01/23/2023 3:35:30 PM        Physical Exam    VS:  BP 130/62 (BP Location: Left Arm, Patient Position: Sitting, Cuff Size: Normal)   Pulse 63   Ht 5\' 6"  (1.676 m)   Wt 172 lb 3.2 oz (78.1 kg)   SpO2 97%   BMI 27.79 kg/m  , BMI Body mass index is 27.79 kg/m.  GEN: Well nourished, well developed, in no acute distress. Neck: No JVD or carotid bruits. Cardiac:  RRR. No murmurs. No rubs or gallops.   Respiratory:  Respirations regular and unlabored. Clear to auscultation without rales, wheezing or rhonchi. GI: Soft, nontender, nondistended. Extremities: Radials/DP/PT 2+ and equal bilaterally. No clubbing or cyanosis. No edema.  Skin: Warm and dry, no rash. Neuro: Strength intact.  Assessment & Plan   Assessment and Plan    CAD/angina S/p PCI with DES to proximal to mid LAD in the setting of STEMI 12/20/2022.  Patient continued to complain of left-sided chest pain throughout hospital admission.  He was placed on Nitropaste during admission with continued complaints of pain and shortness of breath.  Patient complained of continued chest pain at early post hospital follow up. He was started on isosorbide. He reports since last visit he had been doing well with no chest pain until yesterday when he had an episode requiring NTG x 3. There was some confusion about his medications and he reported he was not taking isosorbide  daily. Discussed medications in detail and instructed patient to start isosorbide daily. He has been working with home PT with good tolerance. He is interested in cardiac rehab.  -Continue aspirin, Brilinta, carvedilol, atorvastatin, isosorbide, prn SL NTG.   Chronic HFrEF/ischemic cardiomyopathy Echo 12/20/2022 showed EF 35 to 40%. Normal weight 168-170 Lb.  Patient denies any further lower extremity edema. He has occasional DOE.  Euvolemic and well compensated on exam. -Continue carvedilol, Jardiance, losartan, isosorbide.   Hypertension BP today 130/62. -Continue carvedilol, losartan.   Hyperlipidemia LDL October 2024 90, not at goal.  -Continue atorvastatin.        Disposition: Return in 3 months or sooner as needed.     Cardiac Rehabilitation Eligibility Assessment  The patient is ready to start cardiac rehabilitation from a cardiac standpoint.        Signed, Etta Grandchild. Lenee Franze, DNP, NP-C

## 2023-01-21 ENCOUNTER — Other Ambulatory Visit: Payer: Self-pay | Admitting: Family Medicine

## 2023-01-21 DIAGNOSIS — K5790 Diverticulosis of intestine, part unspecified, without perforation or abscess without bleeding: Secondary | ICD-10-CM | POA: Diagnosis not present

## 2023-01-21 DIAGNOSIS — Z7984 Long term (current) use of oral hypoglycemic drugs: Secondary | ICD-10-CM | POA: Diagnosis not present

## 2023-01-21 DIAGNOSIS — I25118 Atherosclerotic heart disease of native coronary artery with other forms of angina pectoris: Secondary | ICD-10-CM | POA: Diagnosis not present

## 2023-01-21 DIAGNOSIS — K5909 Other constipation: Secondary | ICD-10-CM | POA: Diagnosis not present

## 2023-01-21 DIAGNOSIS — E1169 Type 2 diabetes mellitus with other specified complication: Secondary | ICD-10-CM | POA: Diagnosis not present

## 2023-01-21 DIAGNOSIS — I2102 ST elevation (STEMI) myocardial infarction involving left anterior descending coronary artery: Secondary | ICD-10-CM | POA: Diagnosis not present

## 2023-01-21 DIAGNOSIS — I252 Old myocardial infarction: Secondary | ICD-10-CM

## 2023-01-21 DIAGNOSIS — M159 Polyosteoarthritis, unspecified: Secondary | ICD-10-CM | POA: Diagnosis not present

## 2023-01-21 DIAGNOSIS — E1136 Type 2 diabetes mellitus with diabetic cataract: Secondary | ICD-10-CM | POA: Diagnosis not present

## 2023-01-21 DIAGNOSIS — G8929 Other chronic pain: Secondary | ICD-10-CM | POA: Diagnosis not present

## 2023-01-21 DIAGNOSIS — I10 Essential (primary) hypertension: Secondary | ICD-10-CM | POA: Diagnosis not present

## 2023-01-21 DIAGNOSIS — Z7982 Long term (current) use of aspirin: Secondary | ICD-10-CM | POA: Diagnosis not present

## 2023-01-21 DIAGNOSIS — K219 Gastro-esophageal reflux disease without esophagitis: Secondary | ICD-10-CM | POA: Diagnosis not present

## 2023-01-21 DIAGNOSIS — E039 Hypothyroidism, unspecified: Secondary | ICD-10-CM | POA: Diagnosis not present

## 2023-01-21 DIAGNOSIS — G248 Other dystonia: Secondary | ICD-10-CM | POA: Diagnosis not present

## 2023-01-21 DIAGNOSIS — D649 Anemia, unspecified: Secondary | ICD-10-CM | POA: Diagnosis not present

## 2023-01-21 DIAGNOSIS — M25511 Pain in right shoulder: Secondary | ICD-10-CM | POA: Diagnosis not present

## 2023-01-21 DIAGNOSIS — H8113 Benign paroxysmal vertigo, bilateral: Secondary | ICD-10-CM | POA: Diagnosis not present

## 2023-01-21 DIAGNOSIS — I255 Ischemic cardiomyopathy: Secondary | ICD-10-CM | POA: Diagnosis not present

## 2023-01-21 DIAGNOSIS — E785 Hyperlipidemia, unspecified: Secondary | ICD-10-CM | POA: Diagnosis not present

## 2023-01-21 DIAGNOSIS — H409 Unspecified glaucoma: Secondary | ICD-10-CM | POA: Diagnosis not present

## 2023-01-21 NOTE — Telephone Encounter (Signed)
Medication Refill -  Most Recent Primary Care Visit:  Provider: Smitty Cords  Department: [facility]  Visit Type: HOSPITAL FU  Date: 12/26/2022  Medication: atorvastatin (LIPITOR) 80 MG tablet   Has the patient contacted their pharmacy? Yes (Agent: If no, request that the patient contact the pharmacy for the refill. If patient does not wish to contact the pharmacy document the reason why and proceed with request.) (Agent: If yes, when and what did the pharmacy advise?)  Is this the correct pharmacy for this prescription? No If no, delete pharmacy and type the correct one.  This is the patient's preferred pharmacy: [id number] - [place], [place] - [address] AT [facility] OF [address] [place] [address] [place] [address] Phone: [phone] Fax: [phone]  Has the prescription been filled recently? No  Is the patient out of the medication? No  Has the patient been seen for an appointment in the last year OR does the patient have an upcoming appointment? Yes  Can we respond through MyChart? No  Agent: Please be advised that Rx refills may take up to 3 business days. We ask that you follow-up with your pharmacy.

## 2023-01-22 ENCOUNTER — Other Ambulatory Visit: Payer: Self-pay | Admitting: *Deleted

## 2023-01-22 MED ORDER — ATORVASTATIN CALCIUM 80 MG PO TABS: 80.0000 mg | ORAL_TABLET | Freq: Every day | ORAL | 3 refills | Status: DC

## 2023-01-22 NOTE — Patient Outreach (Signed)
Care Management  Transitions of Care Program Transitions of Care Post-discharge week 5   01/22/2023 Name: Ross Riley MRN: [id number] DOB: 1950/06/24  Subjective: Ross Riley is a 72 y.o. year old male who is a primary care patient of Smitty Cords, DO. The Care Management team Engaged with patient Engaged with patient by telephone to assess and address transitions of care needs.   Consent to Services:  Patient was given information about care management services, agreed to services, and gave verbal consent to participate.   Assessment: Spoke with daughter [name] (interpreter for pt) who reports pt is doing well, continues completing exercises prescribed by PT, has all medications and taking as prescribed, is attending all scheduled appointments.  Pt to follow up with cardiologist on 01/23/23, pt denies any chest pain, no new concerns reported, declines any further outreach with care management citing does not feel is necessary.        SDOH Interventions    Flowsheet Row Patient Outreach from 01/08/2023 in [facility] POPULATION HEALTH DEPARTMENT Telephone from 12/30/2022 in [facility] HEALTH POPULATION HEALTH DEPARTMENT Office Visit from 10/24/2022 in [facility] Clinical Support from 08/07/2022 in [facility] Office Visit from 06/23/2022 in [facility] Office Visit from 04/25/2022 in [facility]  SDOH Interventions        Food Insecurity Interventions Intervention Not Indicated Intervention Not Indicated -- Intervention Not Indicated -- --  Housing Interventions Intervention Not Indicated Intervention Not Indicated -- Intervention Not Indicated -- --  Transportation Interventions Intervention Not Indicated, Patient Resources (Friends/Family) Patient Declined, Patient Resources (Friends/Family) -- Intervention Not Indicated, Patient Resources (Friends/Family) -- --   Utilities Interventions Intervention Not Indicated -- -- Intervention Not Indicated -- --  Alcohol Usage Interventions -- -- -- Intervention Not Indicated (Score <7) Intervention Not Indicated (Score <7) --  Depression Interventions/Treatment  -- -- Counseling -- -- Patient refuses Treatment  Financial Strain Interventions -- -- -- Intervention Not Indicated -- --  Physical Activity Interventions -- -- -- Intervention Not Indicated -- --  Stress Interventions -- -- -- Intervention Not Indicated -- --  Social Connections Interventions -- -- -- Intervention Not Indicated -- --        Goals Addressed             This Visit's Progress    COMPLETED: TOC Care Plan       Current Barriers:  Medication management Scheduled and PRN medications  Provider appointments scheduling and attending appt for changes in condition and routine follow-up Home Health services HH Nursing and PT- [facility] and PT started Functional/Safety SOB and unsteady amb in home Designer, 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  RNCM Clinical Goal(s):  Patient will work with the Care Management team over the next 30 days to address Transition of Care Barriers: Medication Management Support at home Provider appointments take all medications exactly as prescribed and will call provider for medication related questions as evidenced by no missed medications , has all prescribed medications available attend all scheduled medical appointments: with PCP, and Specialists as evidenced by no missed appointments demonstrate ongoing self health care management ability with medication management  as evidenced by calling Provider for changes, managing symptoms   through collaboration with RN Care manager, provider, and care team.   Interventions: Evaluation of current treatment plan related to  self management and patient's adherence to plan as established by provider Transitions of  Care:  Goal on track:  Yes.,  Goal Met., and Patient declined further engagement on this goal. Doctor Visits  - discussed the importance of doctor visits Reviewed all upcoming scheduled appointments and importance of attending Verified with daughter, pt does have all medications and taking as prescribed Reviewed importance of continuing to work with home health physical therapist (daughter reports PT continues working with pt) Reviewed plan of care with patient and daughter including case closure as pt declines further outreach from care management  Patient Goals/Self-Care Activities: Participate in Transition of Care Program/Attend TOC scheduled calls Take all medications as prescribed Attend all scheduled provider appointments Call pharmacy for medication refills 3-7 days in advance of running out of medications Perform all self care activities independently  Perform IADL's (shopping, preparing meals, housekeeping, managing finances) independently Call provider office for new concerns or questions   Follow Up Plan:  The patient has been provided with contact information for the care management team and has been advised to call with any health related questions or concerns.  No further follow up required: Case closure          Plan: The patient has been provided with contact information for the care management team and has been advised to call with any health related questions or concerns.    Case closure- pt completed 30 day TOC program  Irving Shows [facility], BSN RN Care Manager/ Transition of Care [place]/ [facility] [phone]

## 2023-01-22 NOTE — Patient Instructions (Signed)
Visit Information  Thank you for taking time to visit with me today. Please don't hesitate to contact me if I can be of assistance.  Following is a copy of your care plan:   Goals Addressed             This Visit's Progress    COMPLETED: TOC Care Plan       Current Barriers:  Medication management Scheduled and PRN medications  Provider appointments scheduling and attending appt for changes in condition and routine follow-up Home Health services [facility] and PT- [facility] and PT started Functional/Safety SOB and unsteady amb in home Language Barrier- daughter interpreted for patient today  RNCM Clinical Goal(s):  Patient will work with the Care Management team over the next 30 days to address Transition of Care Barriers: Medication Management Support at home Provider appointments take all medications exactly as prescribed and will call provider for medication related questions as evidenced by no missed medications , has all prescribed medications available attend all scheduled medical appointments: with PCP, and Specialists as evidenced by no missed appointments demonstrate ongoing self health care management ability with medication management  as evidenced by calling Provider for changes, managing symptoms   through collaboration with RN Care manager, provider, and care team.   Interventions: Evaluation of current treatment plan related to  self management and patient's adherence to plan as established by provider Transitions of Care:  Goal on track:  Yes., Goal Met., and Patient declined further engagement on this goal. Doctor Visits  - discussed the importance of doctor visits Reviewed all upcoming scheduled appointments and importance of attending Verified with daughter, pt does have all medications and taking as prescribed Reviewed importance of continuing to work with home health physical therapist (daughter reports PT continues working with pt) Reviewed plan of  care with patient and daughter including case closure as pt declines further outreach from care management  Patient Goals/Self-Care Activities: Participate in Transition of Care Program/Attend TOC scheduled calls Take all medications as prescribed Attend all scheduled provider appointments Call pharmacy for medication refills 3-7 days in advance of running out of medications Perform all self care activities independently  Perform IADL's (shopping, preparing meals, housekeeping, managing finances) independently Call provider office for new concerns or questions   Follow Up Plan:  The patient has been provided with contact information for the care management team and has been advised to call with any health related questions or concerns.  No further follow up required: Case closure          Patient verbalizes understanding of instructions and care plan provided today and agrees to view in MyChart. Active MyChart status and patient understanding of how to access instructions and care plan via MyChart confirmed with patient.     No further follow up required: case closure  Please call the care guide team at [phone] if you need to cancel or reschedule your appointment.   Please call the Suicide and Crisis Lifeline: 988 call the [place] National Suicide Prevention Lifeline: 716-368-3471 or TTY: 571-369-2243 TTY 347-501-0553) to talk to a trained counselor call 1-800-273-TALK (toll free, 24 hour hotline) go to [facility] [address] [place] [phone]) call the [facility] Crisis Line: 646-095-4821 call 911 if you are experiencing a Mental Health or Behavioral Health Crisis or need someone to talk to.  Ross Riley [facility], BSN RN Care Manager/ Transition of Care [place]/ [facility] [phone]

## 2023-01-22 NOTE — Telephone Encounter (Signed)
Requested medication (s) are due for refill today:   Requested medication (s) are on the active medication list: Yes  Last refill:  01/12/23  Future visit scheduled: Yes  Notes to clinic:  Historical provider.    Requested Prescriptions  Pending Prescriptions Disp Refills   atorvastatin (LIPITOR) 80 MG tablet      Sig: Take 1 tablet (80 mg total) by mouth daily.     Cardiovascular:  Antilipid - Statins Failed - 01/21/2023  5:47 PM      Failed - Lipid Panel in normal range within the last 12 months    Cholesterol, Total  Date Value Ref Range Status  06/19/2015 195 100 - 199 mg/dL Final   Cholesterol  Date Value Ref Range Status  12/20/2022 172 0 - 200 mg/dL Final   LDL Cholesterol (Calc)  Date Value Ref Range Status  11/09/2019 86 mg/dL (calc) Final    Comment:    Reference range: <100 . Desirable range <100 mg/dL for primary prevention;   <70 mg/dL for patients with CHD or diabetic patients  with > or = 2 CHD risk factors. Marland Kitchen LDL-C is now calculated using the Martin-Hopkins  calculation, which is a validated novel method providing  better accuracy than the Friedewald equation in the  estimation of LDL-C.  Horald Pollen et al. Lenox Ahr. 8119;147(82): 2061-2068  (http://education.[employer].com/faq/FAQ164)    LDL Cholesterol  Date Value Ref Range Status  12/20/2022 90 0 - 99 mg/dL Final    Comment:           Total Cholesterol/HDL:CHD Risk Coronary Heart Disease Risk Table                     Men   Women  1/2 Average Risk   3.4   3.3  Average Risk       5.0   4.4  2 X Average Risk   9.6   7.1  3 X Average Risk  23.4   11.0        Use the calculated Patient Ratio above and the CHD Risk Table to determine the patient's CHD Risk.        ATP III CLASSIFICATION (LDL):  <100     mg/dL   Optimal  956-213  mg/dL   Near or Above                    Optimal  130-159  mg/dL   Borderline  086-578  mg/dL   High  >469     mg/dL   Very High Performed at [facility], [address] [place], [place] [address]    HDL  Date Value Ref Range Status  12/20/2022 49 >40 mg/dL Final  84/13/2440 52 >10 mg/dL Final   Triglycerides  Date Value Ref Range Status  12/20/2022 166 (H) <150 mg/dL Final         Passed - Patient is not pregnant      Passed - Valid encounter within last 12 months    Recent Outpatient Visits           3 weeks ago Coronary artery disease of native artery of native heart with stable angina pectoris [facility])   [place] [facility] [place], Ross Neat, DO   3 months ago Type 2 diabetes mellitus with diabetic cataract, without long-term current use of insulin [facility])   [facility] [place], Ross Neat, DO   7 months ago Colicky  LLQ abdominal pain   [place] [employer] Smitty Cords, DO   9 months ago Type 2 diabetes mellitus with diabetic cataract, without long-term current use of insulin [facility])   [place] [facility] Smitty Cords, DO   11 months ago Viral URI with cough   [place] [facility] [place], Ross Neat, DO       Future Appointments             Tomorrow Ross Levering, NP [place] [facility] [place]   In 3 months Ross Riley, Ross Neat, DO [place] [facility]

## 2023-01-23 ENCOUNTER — Ambulatory Visit: Payer: 59 | Attending: Student | Admitting: Student

## 2023-01-23 ENCOUNTER — Encounter: Payer: Self-pay | Admitting: Student

## 2023-01-23 VITALS — BP 130/62 | HR 63 | Ht 66.0 in | Wt 172.2 lb

## 2023-01-23 DIAGNOSIS — I5020 Unspecified systolic (congestive) heart failure: Secondary | ICD-10-CM

## 2023-01-23 DIAGNOSIS — I25118 Atherosclerotic heart disease of native coronary artery with other forms of angina pectoris: Secondary | ICD-10-CM | POA: Diagnosis not present

## 2023-01-23 DIAGNOSIS — I10 Essential (primary) hypertension: Secondary | ICD-10-CM | POA: Diagnosis not present

## 2023-01-23 DIAGNOSIS — E785 Hyperlipidemia, unspecified: Secondary | ICD-10-CM

## 2023-01-23 MED ORDER — NITROGLYCERIN 0.4 MG SL SUBL: 0.4000 mg | SUBLINGUAL_TABLET | SUBLINGUAL | 2 refills | Status: AC | PRN

## 2023-01-23 NOTE — Patient Instructions (Signed)
Medication Instructions:  No changes at this time.   *If you need a refill on your cardiac medications before your next appointment, please call your pharmacy*   Lab Work: None  If you have labs (blood work) drawn today and your tests are completely normal, you will receive your results only by: MyChart Message (if you have MyChart) OR A paper copy in the mail If you have any lab test that is abnormal or we need to change your treatment, we will call you to review the results.   Testing/Procedures: None   Follow-Up: At [facility], you and your health needs are our priority.  As part of our continuing mission to provide you with exceptional heart care, we have created designated Provider Care Teams.  These Care Teams include your primary Cardiologist (physician) and Advanced Practice Providers (APPs -  Physician Assistants and Nurse Practitioners) who all work together to provide you with the care you need, when you need it.   Your next appointment:   3 month(s)  Provider:   Julien Nordmann, MD

## 2023-01-26 DIAGNOSIS — K5909 Other constipation: Secondary | ICD-10-CM | POA: Diagnosis not present

## 2023-01-26 DIAGNOSIS — E1169 Type 2 diabetes mellitus with other specified complication: Secondary | ICD-10-CM | POA: Diagnosis not present

## 2023-01-26 DIAGNOSIS — H8113 Benign paroxysmal vertigo, bilateral: Secondary | ICD-10-CM | POA: Diagnosis not present

## 2023-01-26 DIAGNOSIS — D649 Anemia, unspecified: Secondary | ICD-10-CM | POA: Diagnosis not present

## 2023-01-26 DIAGNOSIS — H409 Unspecified glaucoma: Secondary | ICD-10-CM | POA: Diagnosis not present

## 2023-01-26 DIAGNOSIS — K219 Gastro-esophageal reflux disease without esophagitis: Secondary | ICD-10-CM | POA: Diagnosis not present

## 2023-01-26 DIAGNOSIS — E785 Hyperlipidemia, unspecified: Secondary | ICD-10-CM | POA: Diagnosis not present

## 2023-01-26 DIAGNOSIS — E039 Hypothyroidism, unspecified: Secondary | ICD-10-CM | POA: Diagnosis not present

## 2023-01-26 DIAGNOSIS — M25511 Pain in right shoulder: Secondary | ICD-10-CM | POA: Diagnosis not present

## 2023-01-26 DIAGNOSIS — I255 Ischemic cardiomyopathy: Secondary | ICD-10-CM | POA: Diagnosis not present

## 2023-01-26 DIAGNOSIS — I2102 ST elevation (STEMI) myocardial infarction involving left anterior descending coronary artery: Secondary | ICD-10-CM | POA: Diagnosis not present

## 2023-01-26 DIAGNOSIS — I25118 Atherosclerotic heart disease of native coronary artery with other forms of angina pectoris: Secondary | ICD-10-CM | POA: Diagnosis not present

## 2023-01-26 DIAGNOSIS — M159 Polyosteoarthritis, unspecified: Secondary | ICD-10-CM | POA: Diagnosis not present

## 2023-01-26 DIAGNOSIS — Z7984 Long term (current) use of oral hypoglycemic drugs: Secondary | ICD-10-CM | POA: Diagnosis not present

## 2023-01-26 DIAGNOSIS — E1136 Type 2 diabetes mellitus with diabetic cataract: Secondary | ICD-10-CM | POA: Diagnosis not present

## 2023-01-26 DIAGNOSIS — K5790 Diverticulosis of intestine, part unspecified, without perforation or abscess without bleeding: Secondary | ICD-10-CM | POA: Diagnosis not present

## 2023-01-26 DIAGNOSIS — Z7982 Long term (current) use of aspirin: Secondary | ICD-10-CM | POA: Diagnosis not present

## 2023-01-26 DIAGNOSIS — G248 Other dystonia: Secondary | ICD-10-CM | POA: Diagnosis not present

## 2023-01-26 DIAGNOSIS — G8929 Other chronic pain: Secondary | ICD-10-CM | POA: Diagnosis not present

## 2023-01-26 DIAGNOSIS — I10 Essential (primary) hypertension: Secondary | ICD-10-CM | POA: Diagnosis not present

## 2023-01-27 DIAGNOSIS — E785 Hyperlipidemia, unspecified: Secondary | ICD-10-CM | POA: Diagnosis not present

## 2023-01-27 DIAGNOSIS — Z7982 Long term (current) use of aspirin: Secondary | ICD-10-CM | POA: Diagnosis not present

## 2023-01-27 DIAGNOSIS — K5909 Other constipation: Secondary | ICD-10-CM | POA: Diagnosis not present

## 2023-01-27 DIAGNOSIS — M159 Polyosteoarthritis, unspecified: Secondary | ICD-10-CM | POA: Diagnosis not present

## 2023-01-27 DIAGNOSIS — I10 Essential (primary) hypertension: Secondary | ICD-10-CM | POA: Diagnosis not present

## 2023-01-27 DIAGNOSIS — I2102 ST elevation (STEMI) myocardial infarction involving left anterior descending coronary artery: Secondary | ICD-10-CM | POA: Diagnosis not present

## 2023-01-27 DIAGNOSIS — I255 Ischemic cardiomyopathy: Secondary | ICD-10-CM | POA: Diagnosis not present

## 2023-01-27 DIAGNOSIS — G248 Other dystonia: Secondary | ICD-10-CM | POA: Diagnosis not present

## 2023-01-27 DIAGNOSIS — E1136 Type 2 diabetes mellitus with diabetic cataract: Secondary | ICD-10-CM | POA: Diagnosis not present

## 2023-01-27 DIAGNOSIS — G8929 Other chronic pain: Secondary | ICD-10-CM | POA: Diagnosis not present

## 2023-01-27 DIAGNOSIS — M25511 Pain in right shoulder: Secondary | ICD-10-CM | POA: Diagnosis not present

## 2023-01-27 DIAGNOSIS — Z7984 Long term (current) use of oral hypoglycemic drugs: Secondary | ICD-10-CM | POA: Diagnosis not present

## 2023-01-27 DIAGNOSIS — E039 Hypothyroidism, unspecified: Secondary | ICD-10-CM | POA: Diagnosis not present

## 2023-01-27 DIAGNOSIS — D649 Anemia, unspecified: Secondary | ICD-10-CM | POA: Diagnosis not present

## 2023-01-27 DIAGNOSIS — I25118 Atherosclerotic heart disease of native coronary artery with other forms of angina pectoris: Secondary | ICD-10-CM | POA: Diagnosis not present

## 2023-01-27 DIAGNOSIS — H8113 Benign paroxysmal vertigo, bilateral: Secondary | ICD-10-CM | POA: Diagnosis not present

## 2023-01-27 DIAGNOSIS — K5790 Diverticulosis of intestine, part unspecified, without perforation or abscess without bleeding: Secondary | ICD-10-CM | POA: Diagnosis not present

## 2023-01-27 DIAGNOSIS — E1169 Type 2 diabetes mellitus with other specified complication: Secondary | ICD-10-CM | POA: Diagnosis not present

## 2023-01-27 DIAGNOSIS — H409 Unspecified glaucoma: Secondary | ICD-10-CM | POA: Diagnosis not present

## 2023-01-27 DIAGNOSIS — K219 Gastro-esophageal reflux disease without esophagitis: Secondary | ICD-10-CM | POA: Diagnosis not present

## 2023-01-28 DIAGNOSIS — L728 Other follicular cysts of the skin and subcutaneous tissue: Secondary | ICD-10-CM | POA: Diagnosis not present

## 2023-01-28 DIAGNOSIS — R208 Other disturbances of skin sensation: Secondary | ICD-10-CM | POA: Diagnosis not present

## 2023-01-28 DIAGNOSIS — L538 Other specified erythematous conditions: Secondary | ICD-10-CM | POA: Diagnosis not present

## 2023-01-28 DIAGNOSIS — D485 Neoplasm of uncertain behavior of skin: Secondary | ICD-10-CM | POA: Diagnosis not present

## 2023-01-30 DIAGNOSIS — E785 Hyperlipidemia, unspecified: Secondary | ICD-10-CM | POA: Diagnosis not present

## 2023-01-30 DIAGNOSIS — E1169 Type 2 diabetes mellitus with other specified complication: Secondary | ICD-10-CM | POA: Diagnosis not present

## 2023-01-30 DIAGNOSIS — H409 Unspecified glaucoma: Secondary | ICD-10-CM | POA: Diagnosis not present

## 2023-01-30 DIAGNOSIS — Z7982 Long term (current) use of aspirin: Secondary | ICD-10-CM | POA: Diagnosis not present

## 2023-01-30 DIAGNOSIS — D649 Anemia, unspecified: Secondary | ICD-10-CM | POA: Diagnosis not present

## 2023-01-30 DIAGNOSIS — G248 Other dystonia: Secondary | ICD-10-CM | POA: Diagnosis not present

## 2023-01-30 DIAGNOSIS — I25118 Atherosclerotic heart disease of native coronary artery with other forms of angina pectoris: Secondary | ICD-10-CM | POA: Diagnosis not present

## 2023-01-30 DIAGNOSIS — K5909 Other constipation: Secondary | ICD-10-CM | POA: Diagnosis not present

## 2023-01-30 DIAGNOSIS — H8113 Benign paroxysmal vertigo, bilateral: Secondary | ICD-10-CM | POA: Diagnosis not present

## 2023-01-30 DIAGNOSIS — M25511 Pain in right shoulder: Secondary | ICD-10-CM | POA: Diagnosis not present

## 2023-01-30 DIAGNOSIS — G8929 Other chronic pain: Secondary | ICD-10-CM | POA: Diagnosis not present

## 2023-01-30 DIAGNOSIS — I2102 ST elevation (STEMI) myocardial infarction involving left anterior descending coronary artery: Secondary | ICD-10-CM | POA: Diagnosis not present

## 2023-01-30 DIAGNOSIS — K5790 Diverticulosis of intestine, part unspecified, without perforation or abscess without bleeding: Secondary | ICD-10-CM | POA: Diagnosis not present

## 2023-01-30 DIAGNOSIS — Z7984 Long term (current) use of oral hypoglycemic drugs: Secondary | ICD-10-CM | POA: Diagnosis not present

## 2023-01-30 DIAGNOSIS — M159 Polyosteoarthritis, unspecified: Secondary | ICD-10-CM | POA: Diagnosis not present

## 2023-01-30 DIAGNOSIS — E039 Hypothyroidism, unspecified: Secondary | ICD-10-CM | POA: Diagnosis not present

## 2023-01-30 DIAGNOSIS — I255 Ischemic cardiomyopathy: Secondary | ICD-10-CM | POA: Diagnosis not present

## 2023-01-30 DIAGNOSIS — K219 Gastro-esophageal reflux disease without esophagitis: Secondary | ICD-10-CM | POA: Diagnosis not present

## 2023-01-30 DIAGNOSIS — I10 Essential (primary) hypertension: Secondary | ICD-10-CM | POA: Diagnosis not present

## 2023-01-30 DIAGNOSIS — E1136 Type 2 diabetes mellitus with diabetic cataract: Secondary | ICD-10-CM | POA: Diagnosis not present

## 2023-01-31 ENCOUNTER — Other Ambulatory Visit: Payer: Self-pay | Admitting: Family Medicine

## 2023-01-31 DIAGNOSIS — I10 Essential (primary) hypertension: Secondary | ICD-10-CM

## 2023-02-02 DIAGNOSIS — E039 Hypothyroidism, unspecified: Secondary | ICD-10-CM | POA: Diagnosis not present

## 2023-02-02 DIAGNOSIS — D649 Anemia, unspecified: Secondary | ICD-10-CM | POA: Diagnosis not present

## 2023-02-02 DIAGNOSIS — Z7984 Long term (current) use of oral hypoglycemic drugs: Secondary | ICD-10-CM | POA: Diagnosis not present

## 2023-02-02 DIAGNOSIS — I10 Essential (primary) hypertension: Secondary | ICD-10-CM | POA: Diagnosis not present

## 2023-02-02 DIAGNOSIS — K5790 Diverticulosis of intestine, part unspecified, without perforation or abscess without bleeding: Secondary | ICD-10-CM | POA: Diagnosis not present

## 2023-02-02 DIAGNOSIS — H409 Unspecified glaucoma: Secondary | ICD-10-CM | POA: Diagnosis not present

## 2023-02-02 DIAGNOSIS — E1169 Type 2 diabetes mellitus with other specified complication: Secondary | ICD-10-CM | POA: Diagnosis not present

## 2023-02-02 DIAGNOSIS — E1136 Type 2 diabetes mellitus with diabetic cataract: Secondary | ICD-10-CM | POA: Diagnosis not present

## 2023-02-02 DIAGNOSIS — I2102 ST elevation (STEMI) myocardial infarction involving left anterior descending coronary artery: Secondary | ICD-10-CM | POA: Diagnosis not present

## 2023-02-02 DIAGNOSIS — E785 Hyperlipidemia, unspecified: Secondary | ICD-10-CM | POA: Diagnosis not present

## 2023-02-02 DIAGNOSIS — M25511 Pain in right shoulder: Secondary | ICD-10-CM | POA: Diagnosis not present

## 2023-02-02 DIAGNOSIS — I25118 Atherosclerotic heart disease of native coronary artery with other forms of angina pectoris: Secondary | ICD-10-CM | POA: Diagnosis not present

## 2023-02-02 DIAGNOSIS — M159 Polyosteoarthritis, unspecified: Secondary | ICD-10-CM | POA: Diagnosis not present

## 2023-02-02 DIAGNOSIS — K5909 Other constipation: Secondary | ICD-10-CM | POA: Diagnosis not present

## 2023-02-02 DIAGNOSIS — G248 Other dystonia: Secondary | ICD-10-CM | POA: Diagnosis not present

## 2023-02-02 DIAGNOSIS — K219 Gastro-esophageal reflux disease without esophagitis: Secondary | ICD-10-CM | POA: Diagnosis not present

## 2023-02-02 DIAGNOSIS — H8113 Benign paroxysmal vertigo, bilateral: Secondary | ICD-10-CM | POA: Diagnosis not present

## 2023-02-02 DIAGNOSIS — I255 Ischemic cardiomyopathy: Secondary | ICD-10-CM | POA: Diagnosis not present

## 2023-02-02 DIAGNOSIS — G8929 Other chronic pain: Secondary | ICD-10-CM | POA: Diagnosis not present

## 2023-02-02 DIAGNOSIS — Z7982 Long term (current) use of aspirin: Secondary | ICD-10-CM | POA: Diagnosis not present

## 2023-02-03 NOTE — Telephone Encounter (Signed)
The original prescription was discontinued on 12/22/2022 by Enedina Finner, MD for the following reason: Stop Taking at Discharge.   Requested Prescriptions  Pending Prescriptions Disp Refills   benazepril-hydrochlorthiazide (LOTENSIN HCT) 10-12.5 MG tablet [Pharmacy Med Name: BENAZEPRIL/HCTZ 10/12.5MG  TABLETS] 90 tablet 0    Sig: TAKE 1 TABLET BY MOUTH DAILY     Cardiovascular:  ACEI + Diuretic Combos Passed - 01/31/2023  3:02 PM      Passed - Na in normal range and within 180 days    Sodium  Date Value Ref Range Status  12/22/2022 138 135 - 145 mmol/L Final  06/19/2015 142 134 - 144 mmol/L Final  12/07/2012 139 136 - 145 mmol/L Final         Passed - K in normal range and within 180 days    Potassium  Date Value Ref Range Status  12/22/2022 3.9 3.5 - 5.1 mmol/L Final  12/07/2012 3.3 (L) 3.5 - 5.1 mmol/L Final         Passed - Cr in normal range and within 180 days    Creat  Date Value Ref Range Status  11/07/2021 0.99 0.70 - 1.28 mg/dL Final   Creatinine, Ser  Date Value Ref Range Status  12/22/2022 0.92 0.61 - 1.24 mg/dL Final   Creatinine, POC  Date Value Ref Range Status  06/18/2015 0 mg/dL Final         Passed - eGFR is 30 or above and within 180 days    GFR, Est African American  Date Value Ref Range Status  11/09/2019 83 > OR = 60 mL/min/1.13m2 Final   GFR, Est Non African American  Date Value Ref Range Status  11/09/2019 72 > OR = 60 mL/min/1.51m2 Final   GFR, Estimated  Date Value Ref Range Status  12/22/2022 >60 >60 mL/min Final    Comment:    (NOTE) Calculated using the CKD-EPI Creatinine Equation (2021)    eGFR  Date Value Ref Range Status  11/07/2021 82 > OR = 60 mL/min/1.24m2 Final         Passed - Patient is not pregnant      Passed - Last BP in normal range    BP Readings from Last 1 Encounters:  01/23/23 130/62         Passed - Valid encounter within last 6 months    Recent Outpatient Visits           1 month ago Coronary artery  disease of native artery of native heart with stable angina pectoris [facility])   [facility] [facility] [place], Netta Neat, DO   3 months ago Type 2 diabetes mellitus with diabetic cataract, without long-term current use of insulin [facility])   [place] [facility] Smitty Cords, DO   7 months ago Colicky LLQ abdominal pain   [place] [facility] [place] Smitty Cords, DO   9 months ago Type 2 diabetes mellitus with diabetic cataract, without long-term current use of insulin [facility])   [place] [facility] Smitty Cords, DO   11 months ago Viral URI with cough   [place] [facility], Netta Neat, DO       Future Appointments             In 2 months Althea Charon, Netta Neat, DO [facility]   In 3 months Gollan, Tollie Pizza, MD [facility]

## 2023-02-04 DIAGNOSIS — M25511 Pain in right shoulder: Secondary | ICD-10-CM | POA: Diagnosis not present

## 2023-02-04 DIAGNOSIS — D649 Anemia, unspecified: Secondary | ICD-10-CM | POA: Diagnosis not present

## 2023-02-04 DIAGNOSIS — H409 Unspecified glaucoma: Secondary | ICD-10-CM | POA: Diagnosis not present

## 2023-02-04 DIAGNOSIS — I25118 Atherosclerotic heart disease of native coronary artery with other forms of angina pectoris: Secondary | ICD-10-CM | POA: Diagnosis not present

## 2023-02-04 DIAGNOSIS — E1169 Type 2 diabetes mellitus with other specified complication: Secondary | ICD-10-CM | POA: Diagnosis not present

## 2023-02-04 DIAGNOSIS — I255 Ischemic cardiomyopathy: Secondary | ICD-10-CM | POA: Diagnosis not present

## 2023-02-04 DIAGNOSIS — Z7984 Long term (current) use of oral hypoglycemic drugs: Secondary | ICD-10-CM | POA: Diagnosis not present

## 2023-02-04 DIAGNOSIS — K5790 Diverticulosis of intestine, part unspecified, without perforation or abscess without bleeding: Secondary | ICD-10-CM | POA: Diagnosis not present

## 2023-02-04 DIAGNOSIS — H8113 Benign paroxysmal vertigo, bilateral: Secondary | ICD-10-CM | POA: Diagnosis not present

## 2023-02-04 DIAGNOSIS — G8929 Other chronic pain: Secondary | ICD-10-CM | POA: Diagnosis not present

## 2023-02-04 DIAGNOSIS — I10 Essential (primary) hypertension: Secondary | ICD-10-CM | POA: Diagnosis not present

## 2023-02-04 DIAGNOSIS — M159 Polyosteoarthritis, unspecified: Secondary | ICD-10-CM | POA: Diagnosis not present

## 2023-02-04 DIAGNOSIS — G248 Other dystonia: Secondary | ICD-10-CM | POA: Diagnosis not present

## 2023-02-04 DIAGNOSIS — E785 Hyperlipidemia, unspecified: Secondary | ICD-10-CM | POA: Diagnosis not present

## 2023-02-04 DIAGNOSIS — E039 Hypothyroidism, unspecified: Secondary | ICD-10-CM | POA: Diagnosis not present

## 2023-02-04 DIAGNOSIS — I2102 ST elevation (STEMI) myocardial infarction involving left anterior descending coronary artery: Secondary | ICD-10-CM | POA: Diagnosis not present

## 2023-02-04 DIAGNOSIS — K5909 Other constipation: Secondary | ICD-10-CM | POA: Diagnosis not present

## 2023-02-04 DIAGNOSIS — E1136 Type 2 diabetes mellitus with diabetic cataract: Secondary | ICD-10-CM | POA: Diagnosis not present

## 2023-02-04 DIAGNOSIS — K219 Gastro-esophageal reflux disease without esophagitis: Secondary | ICD-10-CM | POA: Diagnosis not present

## 2023-02-04 DIAGNOSIS — Z7982 Long term (current) use of aspirin: Secondary | ICD-10-CM | POA: Diagnosis not present

## 2023-02-06 DIAGNOSIS — G248 Other dystonia: Secondary | ICD-10-CM | POA: Diagnosis not present

## 2023-02-06 DIAGNOSIS — K5790 Diverticulosis of intestine, part unspecified, without perforation or abscess without bleeding: Secondary | ICD-10-CM | POA: Diagnosis not present

## 2023-02-06 DIAGNOSIS — D649 Anemia, unspecified: Secondary | ICD-10-CM | POA: Diagnosis not present

## 2023-02-06 DIAGNOSIS — M159 Polyosteoarthritis, unspecified: Secondary | ICD-10-CM | POA: Diagnosis not present

## 2023-02-06 DIAGNOSIS — Z7984 Long term (current) use of oral hypoglycemic drugs: Secondary | ICD-10-CM | POA: Diagnosis not present

## 2023-02-06 DIAGNOSIS — I25118 Atherosclerotic heart disease of native coronary artery with other forms of angina pectoris: Secondary | ICD-10-CM | POA: Diagnosis not present

## 2023-02-06 DIAGNOSIS — I2102 ST elevation (STEMI) myocardial infarction involving left anterior descending coronary artery: Secondary | ICD-10-CM | POA: Diagnosis not present

## 2023-02-06 DIAGNOSIS — I10 Essential (primary) hypertension: Secondary | ICD-10-CM | POA: Diagnosis not present

## 2023-02-06 DIAGNOSIS — K219 Gastro-esophageal reflux disease without esophagitis: Secondary | ICD-10-CM | POA: Diagnosis not present

## 2023-02-06 DIAGNOSIS — E1169 Type 2 diabetes mellitus with other specified complication: Secondary | ICD-10-CM | POA: Diagnosis not present

## 2023-02-06 DIAGNOSIS — H8113 Benign paroxysmal vertigo, bilateral: Secondary | ICD-10-CM | POA: Diagnosis not present

## 2023-02-06 DIAGNOSIS — M25511 Pain in right shoulder: Secondary | ICD-10-CM | POA: Diagnosis not present

## 2023-02-06 DIAGNOSIS — I255 Ischemic cardiomyopathy: Secondary | ICD-10-CM | POA: Diagnosis not present

## 2023-02-06 DIAGNOSIS — Z7982 Long term (current) use of aspirin: Secondary | ICD-10-CM | POA: Diagnosis not present

## 2023-02-06 DIAGNOSIS — G8929 Other chronic pain: Secondary | ICD-10-CM | POA: Diagnosis not present

## 2023-02-06 DIAGNOSIS — H409 Unspecified glaucoma: Secondary | ICD-10-CM | POA: Diagnosis not present

## 2023-02-06 DIAGNOSIS — E785 Hyperlipidemia, unspecified: Secondary | ICD-10-CM | POA: Diagnosis not present

## 2023-02-06 DIAGNOSIS — E039 Hypothyroidism, unspecified: Secondary | ICD-10-CM | POA: Diagnosis not present

## 2023-02-06 DIAGNOSIS — K5909 Other constipation: Secondary | ICD-10-CM | POA: Diagnosis not present

## 2023-02-06 DIAGNOSIS — E1136 Type 2 diabetes mellitus with diabetic cataract: Secondary | ICD-10-CM | POA: Diagnosis not present

## 2023-02-09 ENCOUNTER — Ambulatory Visit: Payer: Self-pay | Admitting: *Deleted

## 2023-02-09 DIAGNOSIS — K5790 Diverticulosis of intestine, part unspecified, without perforation or abscess without bleeding: Secondary | ICD-10-CM | POA: Diagnosis not present

## 2023-02-09 DIAGNOSIS — K219 Gastro-esophageal reflux disease without esophagitis: Secondary | ICD-10-CM | POA: Diagnosis not present

## 2023-02-09 DIAGNOSIS — I255 Ischemic cardiomyopathy: Secondary | ICD-10-CM | POA: Diagnosis not present

## 2023-02-09 DIAGNOSIS — Z7982 Long term (current) use of aspirin: Secondary | ICD-10-CM | POA: Diagnosis not present

## 2023-02-09 DIAGNOSIS — Z7984 Long term (current) use of oral hypoglycemic drugs: Secondary | ICD-10-CM | POA: Diagnosis not present

## 2023-02-09 DIAGNOSIS — E785 Hyperlipidemia, unspecified: Secondary | ICD-10-CM | POA: Diagnosis not present

## 2023-02-09 DIAGNOSIS — D649 Anemia, unspecified: Secondary | ICD-10-CM | POA: Diagnosis not present

## 2023-02-09 DIAGNOSIS — H409 Unspecified glaucoma: Secondary | ICD-10-CM | POA: Diagnosis not present

## 2023-02-09 DIAGNOSIS — E1136 Type 2 diabetes mellitus with diabetic cataract: Secondary | ICD-10-CM | POA: Diagnosis not present

## 2023-02-09 DIAGNOSIS — I10 Essential (primary) hypertension: Secondary | ICD-10-CM | POA: Diagnosis not present

## 2023-02-09 DIAGNOSIS — E039 Hypothyroidism, unspecified: Secondary | ICD-10-CM | POA: Diagnosis not present

## 2023-02-09 DIAGNOSIS — I2102 ST elevation (STEMI) myocardial infarction involving left anterior descending coronary artery: Secondary | ICD-10-CM | POA: Diagnosis not present

## 2023-02-09 DIAGNOSIS — M159 Polyosteoarthritis, unspecified: Secondary | ICD-10-CM | POA: Diagnosis not present

## 2023-02-09 DIAGNOSIS — K5909 Other constipation: Secondary | ICD-10-CM | POA: Diagnosis not present

## 2023-02-09 DIAGNOSIS — I25118 Atherosclerotic heart disease of native coronary artery with other forms of angina pectoris: Secondary | ICD-10-CM | POA: Diagnosis not present

## 2023-02-09 DIAGNOSIS — G8929 Other chronic pain: Secondary | ICD-10-CM | POA: Diagnosis not present

## 2023-02-09 DIAGNOSIS — G248 Other dystonia: Secondary | ICD-10-CM | POA: Diagnosis not present

## 2023-02-09 DIAGNOSIS — M25511 Pain in right shoulder: Secondary | ICD-10-CM | POA: Diagnosis not present

## 2023-02-09 DIAGNOSIS — E1169 Type 2 diabetes mellitus with other specified complication: Secondary | ICD-10-CM | POA: Diagnosis not present

## 2023-02-09 DIAGNOSIS — H8113 Benign paroxysmal vertigo, bilateral: Secondary | ICD-10-CM | POA: Diagnosis not present

## 2023-02-09 NOTE — Telephone Encounter (Signed)
  Chief Complaint: Hematuria Symptoms: Dark blood tinged urine with clots yesterday, none last night or this AM Frequency: 2 days Pertinent Negatives: Patient denies fever, back pain, flank pain, bladder pressure, distention. Disposition: [] ED /[x] Urgent Care (no appt availability in office) / [] Appointment(In office/virtual)/ []  [place] [facility]/ [] Home Care/ [] Refused Recommended Disposition /[] [place] [facility]/ []  Follow-up with PCP Additional Notes:  No availability at practice, advised UC. States will follow disposition.  Spoke with daughter [place], on [place], pt present. Reason for Disposition  Blood in urine  (Exception: Could be normal menstrual bleeding.)    Clots yesterday, none last night or this AM.  Answer Assessment - Initial Assessment Questions 1. COLOR of URINE: "Describe the color of the urine."  (e.g., tea-colored, pink, red, bloody) "Do you have blood clots in your urine?" (e.g., none, pea, grape, small coin)     Clots, bloody,dark 2. ONSET: "When did the bleeding start?"      2 days ago 3. EPISODES: "How many times has there been blood in the urine?" or "How many times today?"     Unsure 4. PAIN with URINATION: "Is there any pain with passing your urine?" If Yes, ask: "How bad is the pain?"  (Scale 1-10; or mild, moderate, severe)    - MILD: Complains slightly about urination hurting.    - MODERATE: Interferes with normal activities.      - SEVERE: Excruciating, unwilling or unable to urinate because of the pain.      none 5. FEVER: "Do you have a fever?" If Yes, ask: "What is your temperature, how was it measured, and when did it start?"     no 6. ASSOCIATED SYMPTOMS: "Are you passing urine more frequently than usual?"     No 7. OTHER SYMPTOMS: "Do you have any other symptoms?" (e.g., back/flank pain, abdomen pain, vomiting)     No  Protocols used: Urine - Blood In-A-AH

## 2023-02-11 DIAGNOSIS — E039 Hypothyroidism, unspecified: Secondary | ICD-10-CM | POA: Diagnosis not present

## 2023-02-11 DIAGNOSIS — M159 Polyosteoarthritis, unspecified: Secondary | ICD-10-CM | POA: Diagnosis not present

## 2023-02-11 DIAGNOSIS — Z7982 Long term (current) use of aspirin: Secondary | ICD-10-CM | POA: Diagnosis not present

## 2023-02-11 DIAGNOSIS — I255 Ischemic cardiomyopathy: Secondary | ICD-10-CM | POA: Diagnosis not present

## 2023-02-11 DIAGNOSIS — M25511 Pain in right shoulder: Secondary | ICD-10-CM | POA: Diagnosis not present

## 2023-02-11 DIAGNOSIS — E1169 Type 2 diabetes mellitus with other specified complication: Secondary | ICD-10-CM | POA: Diagnosis not present

## 2023-02-11 DIAGNOSIS — E785 Hyperlipidemia, unspecified: Secondary | ICD-10-CM | POA: Diagnosis not present

## 2023-02-11 DIAGNOSIS — K5790 Diverticulosis of intestine, part unspecified, without perforation or abscess without bleeding: Secondary | ICD-10-CM | POA: Diagnosis not present

## 2023-02-11 DIAGNOSIS — Z7984 Long term (current) use of oral hypoglycemic drugs: Secondary | ICD-10-CM | POA: Diagnosis not present

## 2023-02-11 DIAGNOSIS — H8113 Benign paroxysmal vertigo, bilateral: Secondary | ICD-10-CM | POA: Diagnosis not present

## 2023-02-11 DIAGNOSIS — K5909 Other constipation: Secondary | ICD-10-CM | POA: Diagnosis not present

## 2023-02-11 DIAGNOSIS — K219 Gastro-esophageal reflux disease without esophagitis: Secondary | ICD-10-CM | POA: Diagnosis not present

## 2023-02-11 DIAGNOSIS — L089 Local infection of the skin and subcutaneous tissue, unspecified: Secondary | ICD-10-CM | POA: Diagnosis not present

## 2023-02-11 DIAGNOSIS — G248 Other dystonia: Secondary | ICD-10-CM | POA: Diagnosis not present

## 2023-02-11 DIAGNOSIS — G8929 Other chronic pain: Secondary | ICD-10-CM | POA: Diagnosis not present

## 2023-02-11 DIAGNOSIS — E1136 Type 2 diabetes mellitus with diabetic cataract: Secondary | ICD-10-CM | POA: Diagnosis not present

## 2023-02-11 DIAGNOSIS — I10 Essential (primary) hypertension: Secondary | ICD-10-CM | POA: Diagnosis not present

## 2023-02-11 DIAGNOSIS — D649 Anemia, unspecified: Secondary | ICD-10-CM | POA: Diagnosis not present

## 2023-02-11 DIAGNOSIS — I25118 Atherosclerotic heart disease of native coronary artery with other forms of angina pectoris: Secondary | ICD-10-CM | POA: Diagnosis not present

## 2023-02-11 DIAGNOSIS — I2102 ST elevation (STEMI) myocardial infarction involving left anterior descending coronary artery: Secondary | ICD-10-CM | POA: Diagnosis not present

## 2023-02-11 DIAGNOSIS — H409 Unspecified glaucoma: Secondary | ICD-10-CM | POA: Diagnosis not present

## 2023-02-13 ENCOUNTER — Telehealth: Payer: Self-pay | Admitting: Cardiovascular Disease

## 2023-02-13 NOTE — Telephone Encounter (Signed)
Daughter [name]) stated patient had AMB REFERRAL TO CARDIAC REHABILITATION but no-one from that office contacted patient.  Daughter wants a call back to discuss next steps.

## 2023-02-16 ENCOUNTER — Other Ambulatory Visit: Payer: Self-pay | Admitting: Family Medicine

## 2023-02-16 DIAGNOSIS — M25572 Pain in left ankle and joints of left foot: Secondary | ICD-10-CM | POA: Diagnosis not present

## 2023-02-16 DIAGNOSIS — K219 Gastro-esophageal reflux disease without esophagitis: Secondary | ICD-10-CM

## 2023-02-16 DIAGNOSIS — Z96662 Presence of left artificial ankle joint: Secondary | ICD-10-CM | POA: Diagnosis not present

## 2023-02-16 DIAGNOSIS — M19072 Primary osteoarthritis, left ankle and foot: Secondary | ICD-10-CM | POA: Diagnosis not present

## 2023-02-16 DIAGNOSIS — M216X2 Other acquired deformities of left foot: Secondary | ICD-10-CM | POA: Diagnosis not present

## 2023-02-16 DIAGNOSIS — M24572 Contracture, left ankle: Secondary | ICD-10-CM | POA: Diagnosis not present

## 2023-02-16 DIAGNOSIS — M792 Neuralgia and neuritis, unspecified: Secondary | ICD-10-CM | POA: Diagnosis not present

## 2023-02-16 DIAGNOSIS — M6788 Other specified disorders of synovium and tendon, other site: Secondary | ICD-10-CM | POA: Diagnosis not present

## 2023-02-17 ENCOUNTER — Other Ambulatory Visit: Payer: Self-pay

## 2023-02-17 ENCOUNTER — Emergency Department
Admission: EM | Admit: 2023-02-17 | Discharge: 2023-02-17 | Disposition: A | Discharge status: Home / Self Care | Payer: 59 | Attending: Emergency Medicine | Admitting: Emergency Medicine

## 2023-02-17 DIAGNOSIS — R339 Retention of urine, unspecified: Secondary | ICD-10-CM

## 2023-02-17 DIAGNOSIS — N3001 Acute cystitis with hematuria: Secondary | ICD-10-CM | POA: Diagnosis not present

## 2023-02-17 DIAGNOSIS — E119 Type 2 diabetes mellitus without complications: Secondary | ICD-10-CM | POA: Insufficient documentation

## 2023-02-17 DIAGNOSIS — N3090 Cystitis, unspecified without hematuria: Secondary | ICD-10-CM | POA: Insufficient documentation

## 2023-02-17 DIAGNOSIS — I10 Essential (primary) hypertension: Secondary | ICD-10-CM | POA: Diagnosis not present

## 2023-02-17 DIAGNOSIS — I2510 Atherosclerotic heart disease of native coronary artery without angina pectoris: Secondary | ICD-10-CM | POA: Insufficient documentation

## 2023-02-17 DIAGNOSIS — R310 Gross hematuria: Secondary | ICD-10-CM

## 2023-02-17 LAB — URINALYSIS, ROUTINE W REFLEX MICROSCOPIC
RBC / HPF: >50 RBC/hpf (ref 0–5)
Squamous Epithelial / HPF: 0 /[HPF] (ref 0–5)
WBC, UA: >50 WBC/hpf (ref 0–5)

## 2023-02-17 LAB — CBC
HCT: 40.4 % (ref 39.0–52.0)
Hemoglobin: 13.4 g/dL (ref 13.0–17.0)
MCH: 29.9 pg (ref 26.0–34.0)
MCHC: 33.2 g/dL (ref 30.0–36.0)
MCV: 90.2 fL (ref 80.0–100.0)
Platelets: 182 10*3/uL (ref 150–400)
RBC: 4.48 MIL/uL (ref 4.22–5.81)
RDW: 13.2 % (ref 11.5–15.5)
WBC: 5.1 10*3/uL (ref 4.0–10.5)
nRBC: 0 % (ref 0.0–0.2)

## 2023-02-17 LAB — COMPREHENSIVE METABOLIC PANEL
ALT: 21 U/L (ref 0–44)
AST: 23 U/L (ref 15–41)
Albumin: 4 g/dL (ref 3.5–5.0)
Alkaline Phosphatase: 65 U/L (ref 38–126)
Anion gap: 8 (ref 5–15)
BUN: 18 mg/dL (ref 8–23)
CO2: 21 mmol/L — ABNORMAL LOW (ref 22–32)
Calcium: 8.4 mg/dL — ABNORMAL LOW (ref 8.9–10.3)
Chloride: 109 mmol/L (ref 98–111)
Creatinine, Ser: 0.69 mg/dL (ref 0.61–1.24)
GFR, Estimated: >60 mL/min (ref >60)
Glucose, Bld: 138 mg/dL — ABNORMAL HIGH (ref 70–99)
Potassium: 3.6 mmol/L (ref 3.5–5.1)
Sodium: 138 mmol/L (ref 135–145)
Total Bilirubin: 0.7 mg/dL (ref <1.2)
Total Protein: 7 g/dL (ref 6.5–8.1)

## 2023-02-17 MED ORDER — CEFPODOXIME PROXETIL 200 MG PO TABS: 200.0000 mg | ORAL_TABLET | Freq: Two times a day (BID) | ORAL | 0 refills | Status: AC

## 2023-02-17 NOTE — ED Triage Notes (Signed)
Pt here with urinary retention that started today. Pt having burning with urination and states he can only get out a few drops. Pt states he still feels full.

## 2023-02-17 NOTE — ED Provider Notes (Signed)
[facility] Provider Note    Event Date/Time   First MD Initiated Contact with Patient 02/17/23 1529     (approximate)   History   Chief Complaint Urinary Retention   HPI  Ross Riley is a 72 y.o. male with past medical history of hypertension, hyperlipidemia, diabetes, and CAD who presents to the ED complaining of urinary retention.  History is limited as patient is Spanish-speaking only, history obtained via interpreter [phone].  Patient reports that yesterday he began having gross hematuria along with some burning when he urinates.  Since this morning, he has had difficulty urinating with severe pain in his lower abdomen, felt like he could not empty his bladder.  He describes similar symptoms in the past requiring catheter placement.  He denies any fevers, flank pain, nausea, or vomiting.  Foley catheter placed in triage and patient now reports feeling much better.     Physical Exam   Triage Vital Signs: ED Triage Vitals  Encounter Vitals Group     BP 02/17/23 1305 (!) 148/86     Systolic BP Percentile --      Diastolic BP Percentile --      Pulse Rate 02/17/23 1305 67     Resp 02/17/23 1305 16     Temp 02/17/23 1305 98.3 F (36.8 C)     Temp Source 02/17/23 1305 Oral     SpO2 02/17/23 1305 100 %     Weight 02/17/23 1307 172 lb 2.9 oz (78.1 kg)     Height 02/17/23 1307 5\' 6"  (1.676 m)     Head Circumference --      Peak Flow --      Pain Score 02/17/23 1307 5     Pain Loc --      Pain Education --      Exclude from Growth Chart --     Most recent vital signs: Vitals:   02/17/23 1305  BP: (!) 148/86  Pulse: 67  Resp: 16  Temp: 98.3 F (36.8 C)  SpO2: 100%    Constitutional: Alert and oriented. Eyes: Conjunctivae are normal. Head: Atraumatic. Nose: No congestion/rhinnorhea. Mouth/Throat: Mucous membranes are moist.  Cardiovascular: Normal rate, regular rhythm. Grossly normal heart sounds.  2+ radial pulses  bilaterally. Respiratory: Normal respiratory effort.  No retractions. Lungs CTAB. Gastrointestinal: Soft and nontender. No distention. Genitourinary: Foley catheter draining pink urine. Musculoskeletal: No lower extremity tenderness nor edema.  Neurologic:  Normal speech and language. No gross focal neurologic deficits are appreciated.    ED Results / Procedures / Treatments   Labs (all labs ordered are listed, but only abnormal results are displayed) Labs Reviewed  COMPREHENSIVE METABOLIC PANEL - Abnormal; Notable for the following components:      Result Value   CO2 21 (*)    Glucose, Bld 138 (*)    Calcium 8.4 (*)    All other components within normal limits  URINALYSIS, ROUTINE W REFLEX MICROSCOPIC - Abnormal; Notable for the following components:   Color, Urine RED (*)    APPearance TURBID (*)    Glucose, UA   (*)    Value: TEST NOT REPORTED DUE TO COLOR INTERFERENCE OF URINE PIGMENT   Hgb urine dipstick   (*)    Value: TEST NOT REPORTED DUE TO COLOR INTERFERENCE OF URINE PIGMENT   Bilirubin Urine   (*)    Value: TEST NOT REPORTED DUE TO COLOR INTERFERENCE OF URINE PIGMENT   Ketones, ur   (*)  Value: TEST NOT REPORTED DUE TO COLOR INTERFERENCE OF URINE PIGMENT   Protein, ur   (*)    Value: TEST NOT REPORTED DUE TO COLOR INTERFERENCE OF URINE PIGMENT   Nitrite   (*)    Value: TEST NOT REPORTED DUE TO COLOR INTERFERENCE OF URINE PIGMENT   Leukocytes,Ua   (*)    Value: TEST NOT REPORTED DUE TO COLOR INTERFERENCE OF URINE PIGMENT   Bacteria, UA MANY (*)    All other components within normal limits  URINE CULTURE  CBC     PROCEDURES:  Critical Care performed: No  Procedures   MEDICATIONS ORDERED IN ED: Medications - No data to display   IMPRESSION / MDM / ASSESSMENT AND PLAN / ED COURSE  I reviewed the triage vital signs and the nursing notes.                              72 y.o. male with past medical history of hypertension, hyperlipidemia, diabetes,  and CAD who presents to the ED with hematuria and urinary retention since this morning.  Patient's presentation is most consistent with acute complicated illness / injury requiring diagnostic workup.  Differential diagnosis includes, but is not limited to, hematuria, urinary retention, cystitis, pyelonephritis, kidney stone.  Patient well-appearing and in no acute distress, vital signs are unremarkable.  Foley catheter placed in triage and is now draining pink urine, patient reports feeling much better following catheter placement and urine appears to be flowing normally.  Color of urine gradually improving following flushing by nursing staff and patient is appropriate for discharge home with urology follow-up.  We will send urine for culture and start on cefpodoxime given's concern for cystitis.  He was counseled to return to the ED for new or worsening symptoms.  Patient agrees with plan.      FINAL CLINICAL IMPRESSION(S) / ED DIAGNOSES   Final diagnoses:  Urinary retention  Gross hematuria  Cystitis     Rx / DC Orders   ED Discharge Orders          Ordered    cefpodoxime (VANTIN) 200 MG tablet  2 times daily        02/17/23 1601             Note:  This document was prepared using Dragon voice recognition software and may include unintentional dictation errors.   Chesley Noon, MD 02/17/23 [phone]

## 2023-02-17 NOTE — Telephone Encounter (Signed)
Requested Prescriptions  Pending Prescriptions Disp Refills   omeprazole (PRILOSEC) 40 MG capsule [Pharmacy Med Name: OMEPRAZOLE 40MG  CAPSULES] 90 capsule 1    Sig: TAKE 1 CAPSULE(40 MG) BY MOUTH DAILY BEFORE BREAKFAST     Gastroenterology: Proton Pump Inhibitors Passed - 02/17/2023  1:19 PM      Passed - Valid encounter within last 12 months    Recent Outpatient Visits           1 month ago Coronary artery disease of native artery of native heart with stable angina pectoris [facility])   [place] [facility] [place], Netta Neat, DO   3 months ago Type 2 diabetes mellitus with diabetic cataract, without long-term current use of insulin [facility])   [place] [facility] [place], Netta Neat, DO   7 months ago [facility] abdominal pain   [place] [facility] Smitty Cords, DO   9 months ago Type 2 diabetes mellitus with diabetic cataract, without long-term current use of insulin [facility])   [place] [facility] Smitty Cords, DO   1 year ago Viral URI with cough   [place] [facility] [place], Netta Neat, DO       Future Appointments             In 2 months Althea Charon, Netta Neat, DO [place] [facility]   In 2 months Mariah Milling, Tollie Pizza, MD [facility]

## 2023-02-17 NOTE — ED Provider Triage Note (Signed)
Emergency Medicine Provider Triage Evaluation Note  Ross Riley , a 72 y.o. male  was evaluated in triage.  Pt complains of urinary retention that started today.  Patient has had history of the same but "long time ago".  He is not aware of any prostate issues.  Review of Systems  Positive: Urinary retention Negative: Denies dysuria, hematuria.  Physical Exam  BP (!) 148/86   Pulse 67   Temp 98.3 F (36.8 C) (Oral)   Resp 16   SpO2 100%  Gen:   Awake, no distress   Resp:  Normal effort lungs are clear bilaterally. MSK:   Moves extremities without difficulty  Other:    Medical Decision Making  Medically screening exam initiated at 1:07 PM.  Appropriate orders placed.  Ross Riley was informed that the remainder of the evaluation will be completed by another provider, this initial triage assessment does not replace that evaluation, and the importance of remaining in the ED until their evaluation is complete.     Tommi Rumps, PA-C 02/17/23 1308

## 2023-02-18 ENCOUNTER — Other Ambulatory Visit: Payer: Self-pay

## 2023-02-18 ENCOUNTER — Emergency Department
Admission: EM | Admit: 2023-02-18 | Discharge: 2023-02-18 | Disposition: A | Discharge status: Home / Self Care | Payer: 59 | Attending: Emergency Medicine | Admitting: Emergency Medicine

## 2023-02-18 ENCOUNTER — Ambulatory Visit: Payer: Self-pay | Admitting: *Deleted

## 2023-02-18 DIAGNOSIS — T83091A Other mechanical complication of indwelling urethral catheter, initial encounter: Secondary | ICD-10-CM | POA: Diagnosis not present

## 2023-02-18 DIAGNOSIS — T83098A Other mechanical complication of other indwelling urethral catheter, initial encounter: Secondary | ICD-10-CM | POA: Diagnosis not present

## 2023-02-18 DIAGNOSIS — R310 Gross hematuria: Secondary | ICD-10-CM | POA: Insufficient documentation

## 2023-02-18 DIAGNOSIS — R319 Hematuria, unspecified: Secondary | ICD-10-CM | POA: Diagnosis present

## 2023-02-18 MED ORDER — PREDNISONE 20 MG PO TABS
60.0000 mg | ORAL_TABLET | Freq: Once | ORAL | Status: AC
  Administered 2023-02-18: 60 mg via ORAL
  Filled 2023-02-18: qty 3

## 2023-02-18 MED ORDER — PREDNISONE 50 MG PO TABS: 50.0000 mg | ORAL_TABLET | Freq: Every day | ORAL | 0 refills | Status: AC

## 2023-02-18 MED ORDER — PREDNISONE 50 MG PO TABS
50.0000 mg | ORAL_TABLET | Freq: Every day | ORAL | 0 refills | Status: DC
Start: 2023-02-18 — End: 2023-02-18

## 2023-02-18 NOTE — Discharge Instructions (Addendum)
Please call tomorrow to nephrology and urology to make an appointment.

## 2023-02-18 NOTE — ED Provider Notes (Signed)
[facility] Provider Note    None    (approximate)   History   Hematuria   HPI  Ross Riley is a 72 y.o. male who was seen yesterday here for urinary retention.  Catheter was placed but the patient refers  obstruction of the catheter today due to blood clots.  Patient refers urine is coming around the catheter.  Patient denies fever, chills or other symptoms.  Patient is taking aspirin daily prescribed  by cardiology. Yesterday patient was referred to nephrology .      Physical Exam   Triage Vital Signs: ED Triage Vitals  Encounter Vitals Group     BP 02/18/23 1418 115/85     Systolic BP Percentile --      Diastolic BP Percentile --      Pulse Rate 02/18/23 1418 (!) 58     Resp 02/18/23 1418 20     Temp 02/18/23 1417 98.2 F (36.8 C)     Temp Source 02/18/23 1417 Oral     SpO2 02/18/23 1418 96 %     Weight 02/18/23 1418 168 lb (76.2 kg)     Height 02/18/23 1418 5\' 6"  (1.676 m)     Head Circumference --      Peak Flow --      Pain Score --      Pain Loc --      Pain Education --      Exclude from Growth Chart --     Most recent vital signs: Vitals:   02/18/23 1418 02/18/23 2100  BP: 115/85 (!) 153/88  Pulse: (!) 58 (!) 55  Resp: 20 17  Temp:  98.2 F (36.8 C)  SpO2: 96% 100%     Constitutional: Alert, with shortness of breath after heart surgery Eyes: Conjunctivae are normal.  Head: Atraumatic. Nose: No congestion/rhinnorhea. Mouth/Throat: Mucous membranes are moist.   Neck: Painless ROM.  Cardiovascular:   Good peripheral circulation. Respiratory: Normal respiratory effort.  No retractions.  Gastrointestinal: Soft and nontender.  Musculoskeletal:  no deformity Neurologic:  MAE spontaneously. No gross focal neurologic deficits are appreciated.  Skin:  Skin is warm, dry and intact. No rash noted. Psychiatric: Mood and affect are normal. Speech and behavior are normal.    ED Results / Procedures / Treatments    Labs (all labs ordered are listed, but only abnormal results are displayed) Labs Reviewed - No data to display   EKG     RADIOLOGY     PROCEDURES:  Critical Care performed:   Procedures   MEDICATIONS ORDERED IN ED: Medications  predniSONE (DELTASONE) tablet 60 mg (60 mg Oral Given 02/18/23 2234)     IMPRESSION / MDM / ASSESSMENT AND PLAN / ED COURSE  I reviewed the triage vital signs and the nursing notes.  Differential diagnosis includes, but is not limited to, obstruction of urinary catheter, catheter displacement, hematuria,  Patient's presentation is most consistent with acute, uncomplicated illness.   Patient's diagnosis is consistent with obstruction of urinary catheter, gross hematuria.  I did review the patient's allergies and medications. The catheter was removed due to obstruction, we are unable to insert a new catheter due to inflammation.  Patient voided 200 mL,patient was scanned, the bladder scan showed 50 mL.  Urology was consulted.  Patient will be discharged home with prescriptions for prednisone to decrease inflammation. Patient is to follow up with nephrology and urology as needed or otherwise directed. Patient is given ED precautions to return  to the ED for any worsening or new symptoms. Discussed plan of care with patient, answered all of patient's questions, Patient agreeable to plan of care. Advised patient to take medications according to the instructions on the label. Discussed possible side effects of new medications. Patient verbalized understanding.     FINAL CLINICAL IMPRESSION(S) / ED DIAGNOSES   Final diagnoses:  Gross hematuria  Obstruction of urinary catheter, initial encounter (HCC)     Rx / DC Orders   ED Discharge Orders          Ordered    predniSONE (DELTASONE) 50 MG tablet  Daily with breakfast        02/18/23 2228             Note:  This document was prepared using Dragon voice recognition software and may  include unintentional dictation errors.   Gladys Damme, PA-C 02/18/23 2326    Merwyn Katos, MD 02/19/23 [phone]

## 2023-02-18 NOTE — ED Provider Triage Note (Signed)
Emergency Medicine Provider Triage Evaluation Note  Ross Riley , a 72 y.o. male  was evaluated in triage.  Pt complains of catheter problems.  Was seen yesterday for foley change and has had drainage from the penis since.  Hematuria in tubing with minimal in foley bag.    Review of Systems  Positive: Foley problems Negative: No pain,   Physical Exam  BP 115/85   Pulse (!) 58   Temp 98.2 F (36.8 C) (Oral)   Resp 20   Ht 5\' 6"  (1.676 m)   Wt 76.2 kg   SpO2 96%   BMI 27.12 kg/m  Gen:   Awake, no distress   Resp:  Normal effort  MSK:   Moves extremities without difficulty  Other:    Medical Decision Making  Medically screening exam initiated at 2:19 PM.  Appropriate orders placed.  Ross Riley was informed that the remainder of the evaluation will be completed by another provider, this initial triage assessment does not replace that evaluation, and the importance of remaining in the ED until their evaluation is complete.     Tommi Rumps, PA-C 02/18/23 1421

## 2023-02-18 NOTE — ED Notes (Signed)
This RN with assistance of another RN attempted to put urinary catheter in pt. Both attempts were unsuccessful - meeting a lot of resistance. Provider made aware.

## 2023-02-18 NOTE — ED Triage Notes (Signed)
Patient had urinary catheter placed yesterday; family reports urine is leaking around urethra but not draining.

## 2023-02-18 NOTE — Telephone Encounter (Signed)
  Chief Complaint: Had Foley catheter inserted 02/17/2023 due to urinary retention.   It was inserted in the ED.     Symptoms: It's leaking urine from his penis around the catheter tubing.  His bed was wet with urine this morning.   It's still leaking from his penis around the catheter today. Frequency: Since last night Pertinent Negatives: Patient denies abd pain or bloody urine   Disposition: [x] ED /[] Urgent Care (no appt availability in office) / [] Appointment(In office/virtual)/ []  [place] [facility]/ [] Home Care/ [] Refused Recommended Disposition /[] [employer] [facility]/ []  Follow-up with PCP Additional Notes: Pt was referred back to the ED for evaluation.   Daughter has put in a call to the urologist but has not heard back yet to make an appt for follow up.

## 2023-02-18 NOTE — ED Notes (Signed)
This RN attempted to manually irrigate urinary catheter - flushes smooth however on redrawing back the fluids pt seems to be in pain and the flush doesn't come smoothly- not able to get all the fluids back. Provider aware.

## 2023-02-18 NOTE — Telephone Encounter (Signed)
Message from Alessandra Bevels sent at 02/18/2023  1:06 PM EST  Summary: Cather Advice   Pt daughter [name] is calling to report that the pt had a cather placed yesterday. Is the cather supposed to leak? Please advise          Call History  Contact Date/Time Type Contact Phone/Fax By  02/18/2023 01:05 PM EST Phone (Incoming) Ileana Ladd (Emergency Contact) [phone] Rexene Edison) Jens Som A   Reason for Disposition  Leakage of urine around catheter    Referred to the ED since not set up with urologist yet for management.  Answer Assessment - Initial Assessment Questions 1. SYMPTOMS: "What symptoms are you concerned about?"     I returned call and spoke with daughter Ross Riley who interpreted for pt in [place].  He was there with her. Went to the ED on 02/17/2023 (yesterday) due to urinary detention and abd pain.   A Foley catheter was inserted in the ED with instructions to follow up with urologist that is listed on his After Visit Summary.   Daughter has placed a call and is waiting for a call back to make a urology appt.  He is having leakage of urine from his penis around where the catheter is inserted.   The leaking started during the night.  There is some urine in the bag but it's also leaking from his penis. 2. ONSET:  "When did the symptoms start?"     Last night.   His bed was wet with urine.   I had her check the port on the bottom of the Foley bag to be sure it was closed.   It is. 3. FEVER: "Is there a fever?" If Yes, ask: "What is the temperature, how was it measured, and when did it start?"     Not asked 4. ABDOMEN PAIN: "Is there any abdomen pain?" (e.g., Scale 1-10; or mild, moderate, severe)     Denies lower abd pain. 5. URINE COLOR: "What color is the urine?"  "Is there blood present in the urine?" (e.g., clear, yellow, cloudy, tea-colored, blood streaks, bright red)     Light pink to yellow 6. URINE AMOUNT: "When did you last empty the urine from the collection bag?"  "How much urine was in the bag at that time?" How much urine is in the collection bag now?"     There is urine in the bag but it's also leaking around his penis where it's inserted. 7. INSERTION: "How long have you (they) had the catheter?"     It was inserted 02/17/2023 in the ED 8. OTHER SYMPTOMS: "Are there any other symptoms?" (e.g., abdomen swelling, back pain, bladder spasms, constipation, foul smelling urine, leaking of urine)      Denies any other symptoms. 9. MEDICINES: "Are you taking any medicines to treat urinary problems?" (e.g., antibiotics for a urinary tract infection, medicines to treat bladder spasms)      Not asked 10. PREGNANCY: "Is there any chance you are pregnant?" "When was your last menstrual period?"       N/A  Protocols used: Urinary Catheter (e.g., Foley) Symptoms and Questions-A-AH

## 2023-02-18 NOTE — ED Notes (Signed)
Pt did void bloody urine .. post bladder scan showed . Provider aware.

## 2023-02-19 ENCOUNTER — Telehealth: Payer: Self-pay

## 2023-02-19 ENCOUNTER — Telehealth: Payer: Self-pay | Admitting: Cardiovascular Disease

## 2023-02-19 LAB — URINE CULTURE: Culture: NO GROWTH

## 2023-02-19 NOTE — Telephone Encounter (Signed)
Ross Riley, he was scheduled with me but Dr. Kirtland Bouchard has openings on that day.  Can we get him rescheduled onto his schedule?

## 2023-02-19 NOTE — Transitions of Care (Post Inpatient/ED Visit) (Signed)
 02/19/2023  Name: Ross Riley MRN: [id number] DOB: [birth year]  Today's TOC FU Call Status: Today's TOC FU Call Status:: Successful TOC FU Call Completed TOC FU Call Complete Date: 02/19/23 Patient's Name and Date of Birth confirmed.  Transition Care Management Follow-up Telephone Call Date of Discharge: 02/18/23 Discharge Facility[facility]) Type of Discharge: Emergency Department Reason for ED Visit: Other: (retention of urine) How have you been since you were released from the hospital?: Better Any questions or concerns?: No  Items Reviewed: Did you receive and understand the discharge instructions provided?: Yes Medications obtained,verified, and reconciled?: Yes (Medications Reviewed) Any new allergies since your discharge?: No Dietary orders reviewed?: NA Do you have support at home?: Yes People in Home: spouse  Medications Reviewed Today: Medications Reviewed Today     Reviewed by Orin Eberwein, Jordan Hawks, CMA (Certified Medical Assistant) on 02/19/23 at 1442  Med List Status: <None>   Medication Order Taking? Sig Documenting Provider Last Dose Status Informant  ACCU-CHEK AVIVA PLUS test strip [id number] No Use to check blood sugar up to 1 x per day Smitty Cords, DO Taking Active   Accu-Chek Softclix Lancets lancets [id number] No Use to check blood sugar up to 1 x per day Smitty Cords, DO Taking Active   aspirin 81 MG chewable tablet [id number] No Chew 1 tablet (81 mg total) by mouth daily. Enedina Finner, MD Taking Active   atorvastatin (LIPITOR) 80 MG tablet [id number] No Take 1 tablet (80 mg total) by mouth at bedtime. Smitty Cords, DO Taking Active   Blood Glucose Monitoring Suppl (ACCU-CHEK AVIVA PLUS) w/Device KIT [id number] No Use to check blood sugar up to 1 x per day Smitty Cords, DO Taking Active   carvedilol (COREG) 3.125 MG tablet [id number] No Take 1 tablet (3.125 mg total) by mouth 2 (two)  times daily with a meal. Enedina Finner, MD Taking Active   cefpodoxime (VANTIN) 200 MG tablet [id number]  Take 1 tablet (200 mg total) by mouth 2 (two) times daily for 10 days. Chesley Noon, MD  Active   celecoxib (CELEBREX) 200 MG capsule [id number] No Take 200 mg by mouth 2 (two) times daily as needed for mild pain (pain score 1-3) or moderate pain (pain score 4-6). [provider] Taking Active Pharmacy Records  dicyclomine (BENTYL) 10 MG capsule [id number] No Take 1 capsule (10 mg total) by mouth 4 (four) times daily -  before meals and at bedtime. As needed for abdominal pain cramping Smitty Cords, DO Taking Active   dorzolamide-timolol (COSOPT) 22.3-6.8 MG/ML ophthalmic solution [id number] No Place 1 drop into both eyes 2 (two) times daily. [provider] Taking Active Pharmacy Records  empagliflozin (JARDIANCE) 10 MG TABS tablet [id number] No Take 1 tablet (10 mg total) by mouth daily. Enedina Finner, MD Taking Active   folic acid (FOLVITE) 1 MG tablet [id number] No Take 1 tablet (1 mg total) by mouth daily. Enedina Finner, MD Taking Active   gabapentin (NEURONTIN) 300 MG capsule [id number] No Take 1 capsule (300 mg total) by mouth 2 (two) times daily.  Patient taking differently: Take 300-600 mg by mouth See admin instructions. Take 1 capsule (300mg ) by mouth every morning, 1 capsule (300mg ) by mouth every day at noon and take 2 capsule (600mg ) by mouth at bedtime   Smitty Cords, DO Taking Active Pharmacy Records  isosorbide mononitrate (IMDUR) 30 MG 24 hr tablet [id number] No Take 0.5 tablets (15 mg total) by mouth daily. Wittenborn,  Gavin Pound, NP Taking Active   latanoprost (XALATAN) 0.005 % ophthalmic solution [id number] No Place 1 drop into both eyes at bedtime. [provider] Taking Active Pharmacy Records  levothyroxine (SYNTHROID) 75 MCG tablet [id number] No Take 75 mcg by mouth daily before breakfast. [provider] Taking Active Pharmacy  Records  losartan (COZAAR) 25 MG tablet [id number] No Take 1 tablet (25 mg total) by mouth daily. Enedina Finner, MD Taking Active   Multiple Vitamin (MULTIVITAMIN WITH MINERALS) TABS tablet [id number] No Take 1 tablet by mouth daily. Enedina Finner, MD Taking Active   nitroGLYCERIN (NITROSTAT) 0.4 MG SL tablet [id number]  Place 1 tablet (0.4 mg total) under the tongue every 5 (five) minutes as needed for chest pain. Carlos Levering, NP  Active   omeprazole (PRILOSEC) 40 MG capsule [id number]  TAKE 1 CAPSULE(40 MG) BY MOUTH DAILY BEFORE BREAKFAST Karamalegos, Netta Neat, DO  Active   predniSONE (DELTASONE) 50 MG tablet [id number]  Take 1 tablet (50 mg total) by mouth daily with breakfast for 4 days. Gladys Damme, PA-C  Active   ticagrelor (BRILINTA) 90 MG TABS tablet [id number] No Take 1 tablet (90 mg total) by mouth 2 (two) times daily. Enedina Finner, MD Taking Active             Home Care and Equipment/Supplies: Were Home Health Services Ordered?: NA Any new equipment or medical supplies ordered?: NA  Functional Questionnaire: Do you need assistance with bathing/showering or dressing?: No Do you need assistance with meal preparation?: No Do you need assistance with eating?: No Do you have difficulty maintaining continence: No Do you need assistance with getting out of bed/getting out of a chair/moving?: No Do you have difficulty managing or taking your medications?: No  Follow up appointments reviewed: PCP Follow-up appointment confirmed?: Yes Date of PCP follow-up appointment?: 02/23/23 Follow-up Provider: Nicki Reaper NP Specialist Hospital Follow-up appointment confirmed?: Yes Date of Specialist follow-up appointment?: 03/26/23 Follow-Up Specialty Provider:: [facility] Do you need transportation to your follow-up appointment?: No Do you understand care options if your condition(s) worsen?: Yes-patient verbalized understanding     Temprence Rhines, CMA  [facility] AWV Team Direct  Dial: [phone]

## 2023-02-19 NOTE — Telephone Encounter (Signed)
Will discuss at upcoming appointment

## 2023-02-19 NOTE — Telephone Encounter (Signed)
Yes I can see him of course!  Saralyn Pilar, DO [facility] 02/19/2023, 3:10 PM

## 2023-02-19 NOTE — Telephone Encounter (Signed)
Pt c/o medication issue:  1. Name of Medication:   aspirin 81 MG chewable tablet    2. How are you currently taking this medication (dosage and times per day)? Chew 1 tablet (81 mg total) by mouth daily.   3. Are you having a reaction (difficulty breathing--STAT)? No  4. What is your medication issue? Patient's daughter is calling in regards to the patient message that was sent. Patient's daughter stated the patient was in the ED and was told to stop taking this medication. Patient's daughter would like to know if the patient should stop this medication completely or replace it with another medication. Please advise.

## 2023-02-20 ENCOUNTER — Telehealth: Payer: Self-pay | Admitting: Family Medicine

## 2023-02-20 DIAGNOSIS — E1136 Type 2 diabetes mellitus with diabetic cataract: Secondary | ICD-10-CM | POA: Diagnosis not present

## 2023-02-20 DIAGNOSIS — I10 Essential (primary) hypertension: Secondary | ICD-10-CM | POA: Diagnosis not present

## 2023-02-20 DIAGNOSIS — Z7984 Long term (current) use of oral hypoglycemic drugs: Secondary | ICD-10-CM | POA: Diagnosis not present

## 2023-02-20 DIAGNOSIS — I255 Ischemic cardiomyopathy: Secondary | ICD-10-CM | POA: Diagnosis not present

## 2023-02-20 DIAGNOSIS — G248 Other dystonia: Secondary | ICD-10-CM | POA: Diagnosis not present

## 2023-02-20 DIAGNOSIS — G8929 Other chronic pain: Secondary | ICD-10-CM | POA: Diagnosis not present

## 2023-02-20 DIAGNOSIS — H8113 Benign paroxysmal vertigo, bilateral: Secondary | ICD-10-CM | POA: Diagnosis not present

## 2023-02-20 DIAGNOSIS — E039 Hypothyroidism, unspecified: Secondary | ICD-10-CM | POA: Diagnosis not present

## 2023-02-20 DIAGNOSIS — K219 Gastro-esophageal reflux disease without esophagitis: Secondary | ICD-10-CM | POA: Diagnosis not present

## 2023-02-20 DIAGNOSIS — Z7982 Long term (current) use of aspirin: Secondary | ICD-10-CM | POA: Diagnosis not present

## 2023-02-20 DIAGNOSIS — M159 Polyosteoarthritis, unspecified: Secondary | ICD-10-CM | POA: Diagnosis not present

## 2023-02-20 DIAGNOSIS — I25118 Atherosclerotic heart disease of native coronary artery with other forms of angina pectoris: Secondary | ICD-10-CM | POA: Diagnosis not present

## 2023-02-20 DIAGNOSIS — D649 Anemia, unspecified: Secondary | ICD-10-CM | POA: Diagnosis not present

## 2023-02-20 DIAGNOSIS — K5909 Other constipation: Secondary | ICD-10-CM | POA: Diagnosis not present

## 2023-02-20 DIAGNOSIS — H409 Unspecified glaucoma: Secondary | ICD-10-CM | POA: Diagnosis not present

## 2023-02-20 DIAGNOSIS — E785 Hyperlipidemia, unspecified: Secondary | ICD-10-CM | POA: Diagnosis not present

## 2023-02-20 DIAGNOSIS — E1169 Type 2 diabetes mellitus with other specified complication: Secondary | ICD-10-CM | POA: Diagnosis not present

## 2023-02-20 DIAGNOSIS — K5790 Diverticulosis of intestine, part unspecified, without perforation or abscess without bleeding: Secondary | ICD-10-CM | POA: Diagnosis not present

## 2023-02-20 DIAGNOSIS — I2102 ST elevation (STEMI) myocardial infarction involving left anterior descending coronary artery: Secondary | ICD-10-CM | POA: Diagnosis not present

## 2023-02-20 DIAGNOSIS — M25511 Pain in right shoulder: Secondary | ICD-10-CM | POA: Diagnosis not present

## 2023-02-20 NOTE — Telephone Encounter (Signed)
Melissa w/ [facility] states she is reporting missed visit for OT this week, pt declined. [phone]

## 2023-02-20 NOTE — Telephone Encounter (Signed)
Left message per DPR of the following:  "It is okay for you to stop aspirin and continue Brilinta"

## 2023-02-22 DIAGNOSIS — D649 Anemia, unspecified: Secondary | ICD-10-CM | POA: Diagnosis not present

## 2023-02-22 DIAGNOSIS — E785 Hyperlipidemia, unspecified: Secondary | ICD-10-CM | POA: Diagnosis not present

## 2023-02-22 DIAGNOSIS — Z7982 Long term (current) use of aspirin: Secondary | ICD-10-CM | POA: Diagnosis not present

## 2023-02-22 DIAGNOSIS — Z7984 Long term (current) use of oral hypoglycemic drugs: Secondary | ICD-10-CM | POA: Diagnosis not present

## 2023-02-22 DIAGNOSIS — E1169 Type 2 diabetes mellitus with other specified complication: Secondary | ICD-10-CM | POA: Diagnosis not present

## 2023-02-22 DIAGNOSIS — I255 Ischemic cardiomyopathy: Secondary | ICD-10-CM | POA: Diagnosis not present

## 2023-02-22 DIAGNOSIS — E1136 Type 2 diabetes mellitus with diabetic cataract: Secondary | ICD-10-CM | POA: Diagnosis not present

## 2023-02-22 DIAGNOSIS — M159 Polyosteoarthritis, unspecified: Secondary | ICD-10-CM | POA: Diagnosis not present

## 2023-02-22 DIAGNOSIS — K5909 Other constipation: Secondary | ICD-10-CM | POA: Diagnosis not present

## 2023-02-22 DIAGNOSIS — I2102 ST elevation (STEMI) myocardial infarction involving left anterior descending coronary artery: Secondary | ICD-10-CM | POA: Diagnosis not present

## 2023-02-22 DIAGNOSIS — K5790 Diverticulosis of intestine, part unspecified, without perforation or abscess without bleeding: Secondary | ICD-10-CM | POA: Diagnosis not present

## 2023-02-22 DIAGNOSIS — I10 Essential (primary) hypertension: Secondary | ICD-10-CM | POA: Diagnosis not present

## 2023-02-22 DIAGNOSIS — K219 Gastro-esophageal reflux disease without esophagitis: Secondary | ICD-10-CM | POA: Diagnosis not present

## 2023-02-22 DIAGNOSIS — G8929 Other chronic pain: Secondary | ICD-10-CM | POA: Diagnosis not present

## 2023-02-22 DIAGNOSIS — G248 Other dystonia: Secondary | ICD-10-CM | POA: Diagnosis not present

## 2023-02-22 DIAGNOSIS — E039 Hypothyroidism, unspecified: Secondary | ICD-10-CM | POA: Diagnosis not present

## 2023-02-22 DIAGNOSIS — M25511 Pain in right shoulder: Secondary | ICD-10-CM | POA: Diagnosis not present

## 2023-02-22 DIAGNOSIS — I25118 Atherosclerotic heart disease of native coronary artery with other forms of angina pectoris: Secondary | ICD-10-CM | POA: Diagnosis not present

## 2023-02-22 DIAGNOSIS — H409 Unspecified glaucoma: Secondary | ICD-10-CM | POA: Diagnosis not present

## 2023-02-22 DIAGNOSIS — H8113 Benign paroxysmal vertigo, bilateral: Secondary | ICD-10-CM | POA: Diagnosis not present

## 2023-02-23 ENCOUNTER — Inpatient Hospital Stay: Payer: 59 | Admitting: Family Medicine

## 2023-02-23 ENCOUNTER — Inpatient Hospital Stay: Payer: 59 | Admitting: Internal Medicine

## 2023-02-23 DIAGNOSIS — Z7982 Long term (current) use of aspirin: Secondary | ICD-10-CM | POA: Diagnosis not present

## 2023-02-23 DIAGNOSIS — E039 Hypothyroidism, unspecified: Secondary | ICD-10-CM | POA: Diagnosis not present

## 2023-02-23 DIAGNOSIS — E1136 Type 2 diabetes mellitus with diabetic cataract: Secondary | ICD-10-CM | POA: Diagnosis not present

## 2023-02-23 DIAGNOSIS — G248 Other dystonia: Secondary | ICD-10-CM | POA: Diagnosis not present

## 2023-02-23 DIAGNOSIS — H409 Unspecified glaucoma: Secondary | ICD-10-CM | POA: Diagnosis not present

## 2023-02-23 DIAGNOSIS — G8929 Other chronic pain: Secondary | ICD-10-CM | POA: Diagnosis not present

## 2023-02-23 DIAGNOSIS — I255 Ischemic cardiomyopathy: Secondary | ICD-10-CM | POA: Diagnosis not present

## 2023-02-23 DIAGNOSIS — E785 Hyperlipidemia, unspecified: Secondary | ICD-10-CM | POA: Diagnosis not present

## 2023-02-23 DIAGNOSIS — M25511 Pain in right shoulder: Secondary | ICD-10-CM | POA: Diagnosis not present

## 2023-02-23 DIAGNOSIS — H8113 Benign paroxysmal vertigo, bilateral: Secondary | ICD-10-CM | POA: Diagnosis not present

## 2023-02-23 DIAGNOSIS — I2102 ST elevation (STEMI) myocardial infarction involving left anterior descending coronary artery: Secondary | ICD-10-CM | POA: Diagnosis not present

## 2023-02-23 DIAGNOSIS — K5790 Diverticulosis of intestine, part unspecified, without perforation or abscess without bleeding: Secondary | ICD-10-CM | POA: Diagnosis not present

## 2023-02-23 DIAGNOSIS — E1169 Type 2 diabetes mellitus with other specified complication: Secondary | ICD-10-CM | POA: Diagnosis not present

## 2023-02-23 DIAGNOSIS — D649 Anemia, unspecified: Secondary | ICD-10-CM | POA: Diagnosis not present

## 2023-02-23 DIAGNOSIS — I25118 Atherosclerotic heart disease of native coronary artery with other forms of angina pectoris: Secondary | ICD-10-CM | POA: Diagnosis not present

## 2023-02-23 DIAGNOSIS — K219 Gastro-esophageal reflux disease without esophagitis: Secondary | ICD-10-CM | POA: Diagnosis not present

## 2023-02-23 DIAGNOSIS — I10 Essential (primary) hypertension: Secondary | ICD-10-CM | POA: Diagnosis not present

## 2023-02-23 DIAGNOSIS — Z7984 Long term (current) use of oral hypoglycemic drugs: Secondary | ICD-10-CM | POA: Diagnosis not present

## 2023-02-23 DIAGNOSIS — M159 Polyosteoarthritis, unspecified: Secondary | ICD-10-CM | POA: Diagnosis not present

## 2023-02-23 DIAGNOSIS — K5909 Other constipation: Secondary | ICD-10-CM | POA: Diagnosis not present

## 2023-02-23 NOTE — Telephone Encounter (Signed)
Acknowledged.  No further action required  Saralyn Pilar, DO [facility] 02/23/2023, 11:44 AM

## 2023-02-26 DIAGNOSIS — H409 Unspecified glaucoma: Secondary | ICD-10-CM | POA: Diagnosis not present

## 2023-02-26 DIAGNOSIS — Z7984 Long term (current) use of oral hypoglycemic drugs: Secondary | ICD-10-CM | POA: Diagnosis not present

## 2023-02-26 DIAGNOSIS — K5790 Diverticulosis of intestine, part unspecified, without perforation or abscess without bleeding: Secondary | ICD-10-CM | POA: Diagnosis not present

## 2023-02-26 DIAGNOSIS — D649 Anemia, unspecified: Secondary | ICD-10-CM | POA: Diagnosis not present

## 2023-02-26 DIAGNOSIS — H8113 Benign paroxysmal vertigo, bilateral: Secondary | ICD-10-CM | POA: Diagnosis not present

## 2023-02-26 DIAGNOSIS — G8929 Other chronic pain: Secondary | ICD-10-CM | POA: Diagnosis not present

## 2023-02-26 DIAGNOSIS — I2102 ST elevation (STEMI) myocardial infarction involving left anterior descending coronary artery: Secondary | ICD-10-CM | POA: Diagnosis not present

## 2023-02-26 DIAGNOSIS — K5909 Other constipation: Secondary | ICD-10-CM | POA: Diagnosis not present

## 2023-02-26 DIAGNOSIS — I255 Ischemic cardiomyopathy: Secondary | ICD-10-CM | POA: Diagnosis not present

## 2023-02-26 DIAGNOSIS — K219 Gastro-esophageal reflux disease without esophagitis: Secondary | ICD-10-CM | POA: Diagnosis not present

## 2023-02-26 DIAGNOSIS — E039 Hypothyroidism, unspecified: Secondary | ICD-10-CM | POA: Diagnosis not present

## 2023-02-26 DIAGNOSIS — G248 Other dystonia: Secondary | ICD-10-CM | POA: Diagnosis not present

## 2023-02-26 DIAGNOSIS — I10 Essential (primary) hypertension: Secondary | ICD-10-CM | POA: Diagnosis not present

## 2023-02-26 DIAGNOSIS — E785 Hyperlipidemia, unspecified: Secondary | ICD-10-CM | POA: Diagnosis not present

## 2023-02-26 DIAGNOSIS — E1169 Type 2 diabetes mellitus with other specified complication: Secondary | ICD-10-CM | POA: Diagnosis not present

## 2023-02-26 DIAGNOSIS — I25118 Atherosclerotic heart disease of native coronary artery with other forms of angina pectoris: Secondary | ICD-10-CM | POA: Diagnosis not present

## 2023-02-26 DIAGNOSIS — M159 Polyosteoarthritis, unspecified: Secondary | ICD-10-CM | POA: Diagnosis not present

## 2023-02-26 DIAGNOSIS — Z7982 Long term (current) use of aspirin: Secondary | ICD-10-CM | POA: Diagnosis not present

## 2023-02-26 DIAGNOSIS — M25511 Pain in right shoulder: Secondary | ICD-10-CM | POA: Diagnosis not present

## 2023-02-26 DIAGNOSIS — E1136 Type 2 diabetes mellitus with diabetic cataract: Secondary | ICD-10-CM | POA: Diagnosis not present

## 2023-02-27 ENCOUNTER — Ambulatory Visit (INDEPENDENT_AMBULATORY_CARE_PROVIDER_SITE_OTHER): Payer: 59 | Admitting: Family Medicine

## 2023-02-27 ENCOUNTER — Encounter: Payer: Self-pay | Admitting: Family Medicine

## 2023-02-27 VITALS — BP 130/70 | HR 52 | Ht 66.0 in | Wt 171.0 lb

## 2023-02-27 DIAGNOSIS — R339 Retention of urine, unspecified: Secondary | ICD-10-CM

## 2023-02-27 DIAGNOSIS — R310 Gross hematuria: Secondary | ICD-10-CM | POA: Diagnosis not present

## 2023-02-27 NOTE — Patient Instructions (Addendum)
Thank you for coming to the office today.  No further changes Finish antibiotic Remain OFF Aspirin Continue Brilinta Keep up with Urologist on Monday 03/02/23   Please schedule a Follow-up Appointment to: Return if symptoms worsen or fail to improve.  If you have any other questions or concerns, please feel free to call the office or send a message through MyChart. You may also schedule an earlier appointment if necessary.  Additionally, you may be receiving a survey about your experience at our office within a few days to 1 week by e-mail or mail. We value your feedback.  Saralyn Pilar, DO [facility], [place]

## 2023-02-27 NOTE — Progress Notes (Signed)
Subjective:    Patient ID: Ross Riley, male    DOB: 1950-03-19, 72 y.o.   MRN: [id number]  Ross Riley is a 72 y.o. male presenting on 02/27/2023 for Hospitalization Follow-up (Urine difficulty)   HPI  Discussed the use of AI scribe software for clinical note transcription with the patient, who gave verbal consent to proceed.  History of Present Illness    During the hospital visits, the patient was advised to discontinue aspirin by one of the attending physicians, which was later confirmed by his cardiologist. The patient also reported being prescribed an antibiotic, presumably for a suspected urinary tract infection. The patient was compliant with the antibiotic regimen, with only two doses remaining at the time of the consultation.  The patient denied any current urinary symptoms, including urgency or frequency, and reported urinating three to four times a day without any issues. He is scheduled to see a urologist for further evaluation of his urinary symptoms.       ED FOLLOW-UP VISIT  Hospital/Location: [facility] Date of ED Visit: 02/17/23  Reason for Presenting to ED: Gross Hematuria  FOLLOW-UP  - ED provider note and record have been reviewed - Patient presents today about 9 days after recent ED visit.   Brief summary of recent course, patient had symptoms of gross hematuria and urinary retention  The episode was characterized by hematuria, leading to the placement of a urinary catheter. However, within a few days, the catheter became obstructed, necessitating a second ER visit and catheter removal. Following this, the patient reported normal urination without any further episodes of hematuria.  Initially thought to have UTI, he was tried on cephalosporin  Initial question if hematuria is from Aspirin. He held off on Aspirin as per Cardiologist. He will continue Brilinta  Currently no further urinary symptoms. No hematuria or urinary retention.  - Today reports  overall has done well after discharge from ED. Symptoms of urinary retention hematuria  have resolved   - New medications on discharge: cephalosporin, 2 doses left - Changes to current meds on discharge: none  I have reviewed the discharge medication list, and have reconciled the current and discharge medications today.       12/26/2022    2:03 PM 10/24/2022    2:25 PM 08/07/2022    2:31 PM  Depression screen PHQ 2/9  Decreased Interest 0 0 1  Down, Depressed, Hopeless 0 1 1  PHQ - 2 Score 0 1 2  Altered sleeping 0 0 1  Tired, decreased energy 0 1 1  Change in appetite 0 0 0  Feeling bad or failure about yourself  0 0 0  Trouble concentrating 0 0 0  Moving slowly or fidgety/restless 0 0 0  Suicidal thoughts 0 3 0  PHQ-9 Score 0 5 4  Difficult doing work/chores Not difficult at all Somewhat difficult Not difficult at all       12/26/2022    2:03 PM 10/24/2022    2:26 PM 06/23/2022    4:34 PM 04/25/2022   10:24 AM  GAD 7 : Generalized Anxiety Score  Nervous, Anxious, on Edge 0 0 0 1  Control/stop worrying 0 0 0 1  Worry too much - different things 0 0 0 0  Trouble relaxing 0 1 0 1  Restless 0 1 0 0  Easily annoyed or irritable 0 0 0 0  Afraid - awful might happen 0 0 0 0  Total GAD 7 Score 0 2 0 3  Anxiety Difficulty Not difficult at all Not difficult at all  Not difficult at all    Social History   Tobacco Use   Smoking status: Former    Current packs/day: 0.00    Average packs/day: 1 pack/day for 32.0 years (32.0 ttl pk-yrs)    Types: Cigarettes    Start date: 03/04/1959    Quit date: 03/04/1991    Years since quitting: 32.0   Smokeless tobacco: Former  Building services engineer status: Never Used  Substance Use Topics   Alcohol use: Yes    Alcohol/week: 7.0 standard drinks of alcohol    Types: 7 Shots of liquor per week    Comment: 1-2 shots of vodka daily   Drug use: No    Review of Systems Per HPI unless specifically indicated above     Objective:    BP  130/70   Pulse (!) 52   Ht 5\' 6"  (1.676 m)   Wt 171 lb (77.6 kg)   SpO2 95%   BMI 27.60 kg/m   Wt Readings from Last 3 Encounters:  02/27/23 171 lb (77.6 kg)  02/18/23 168 lb (76.2 kg)  02/17/23 172 lb 2.9 oz (78.1 kg)    Physical Exam Vitals and nursing note reviewed.  Constitutional:      General: He is not in acute distress.    Appearance: Normal appearance. He is well-developed. He is not diaphoretic.     Comments: Well-appearing, comfortable, cooperative  HENT:     Head: Normocephalic and atraumatic.  Eyes:     General:        Right eye: No discharge.        Left eye: No discharge.     Conjunctiva/sclera: Conjunctivae normal.  Cardiovascular:     Rate and Rhythm: Normal rate.  Pulmonary:     Effort: Pulmonary effort is normal.  Skin:    General: Skin is warm and dry.     Findings: No erythema or rash.  Neurological:     Mental Status: He is alert and oriented to person, place, and time.  Psychiatric:        Mood and Affect: Mood normal.        Behavior: Behavior normal.        Thought Content: Thought content normal.     Comments: Well groomed, good eye contact, normal speech and thoughts     Results for orders placed or performed during the hospital encounter of 02/17/23  Urine Culture   Collection Time: 02/17/23  1:33 PM   Specimen: Urine, Catheterized  Result Value Ref Range   Specimen Description      URINE, CATHETERIZED Performed at [facility], [address], [place], [place] [address]    Special Requests      NONE Performed at [facility], [address], [place], [place] [address]    Culture      NO GROWTH Performed at [facility], [address] [place] [place], [place] [address]    Report Status 02/19/2023 FINAL   Urinalysis, Routine w reflex microscopic -Urine, Catheterized   Collection Time: 02/17/23  1:33 PM  Result Value Ref Range   Color, Urine RED (A) YELLOW   APPearance TURBID (A) CLEAR   Specific Gravity,  Urine  1.005 - 1.030    TEST NOT REPORTED DUE TO COLOR INTERFERENCE OF URINE PIGMENT   pH  5.0 - 8.0    TEST NOT REPORTED DUE TO COLOR INTERFERENCE OF URINE PIGMENT  Glucose, UA (A) NEGATIVE mg/dL    TEST NOT REPORTED DUE TO COLOR INTERFERENCE OF URINE PIGMENT   Hgb urine dipstick (A) NEGATIVE    TEST NOT REPORTED DUE TO COLOR INTERFERENCE OF URINE PIGMENT   Bilirubin Urine (A) NEGATIVE    TEST NOT REPORTED DUE TO COLOR INTERFERENCE OF URINE PIGMENT   Ketones, ur (A) NEGATIVE mg/dL    TEST NOT REPORTED DUE TO COLOR INTERFERENCE OF URINE PIGMENT   Protein, ur (A) NEGATIVE mg/dL    TEST NOT REPORTED DUE TO COLOR INTERFERENCE OF URINE PIGMENT   Nitrite (A) NEGATIVE    TEST NOT REPORTED DUE TO COLOR INTERFERENCE OF URINE PIGMENT   Leukocytes,Ua (A) NEGATIVE    TEST NOT REPORTED DUE TO COLOR INTERFERENCE OF URINE PIGMENT   RBC / HPF >50 0 - 5 RBC/hpf   WBC, UA >50 0 - 5 WBC/hpf   Bacteria, UA MANY (A) NONE SEEN   Squamous Epithelial / HPF 0 0 - 5 /HPF  CBC   Collection Time: 02/17/23  2:07 PM  Result Value Ref Range   WBC 5.1 4.0 - 10.5 K/uL   RBC 4.48 4.22 - 5.81 MIL/uL   Hemoglobin 13.4 13.0 - 17.0 g/dL   HCT 40.9 81.1 - 91.4 %   MCV 90.2 80.0 - 100.0 fL   MCH 29.9 26.0 - 34.0 pg   MCHC 33.2 30.0 - 36.0 g/dL   RDW 78.2 95.6 - 21.3 %   Platelets 182 150 - 400 K/uL   nRBC 0.0 0.0 - 0.2 %  Comprehensive metabolic panel   Collection Time: 02/17/23  2:07 PM  Result Value Ref Range   Sodium 138 135 - 145 mmol/L   Potassium 3.6 3.5 - 5.1 mmol/L   Chloride 109 98 - 111 mmol/L   CO2 21 (L) 22 - 32 mmol/L   Glucose, Bld 138 (H) 70 - 99 mg/dL   BUN 18 8 - 23 mg/dL   Creatinine, Ser 0.86 0.61 - 1.24 mg/dL   Calcium 8.4 (L) 8.9 - 10.3 mg/dL   Total Protein 7.0 6.5 - 8.1 g/dL   Albumin 4.0 3.5 - 5.0 g/dL   AST 23 15 - 41 U/L   ALT 21 0 - 44 U/L   Alkaline Phosphatase 65 38 - 126 U/L   Total Bilirubin 0.7 <1.2 mg/dL   GFR, Estimated >57 >84 mL/min   Anion gap 8 5 - 15       Assessment & Plan:   Problem List Items Addressed This Visit   None Visit Diagnoses       Gross hematuria    -  Primary     Urinary retention            UTI - resolved, but no growth on culture. Gross Hematuria Urinary Retention Recent episodes of urinary retention requiring catheterization, possibly secondary to blood clots from hematuria. No current catheter. Urination is now normal. No current blood in urine.  -Continue current management. -Complete remaining two days of antibiotics. -Follow-up with urologist on 03/02/2023 for further evaluation and possible cystoscopy.  Recent bleeding possibly related to aspirin use. Aspirin has been discontinued while Brilinta is continued. -Continue current management. -Communicate with cardiologist regarding anticoagulation management.  Follow-up in February 2025 for routine visit.         No orders of the defined types were placed in this encounter.   No orders of the defined types were placed in this encounter.   Follow up plan: Return if  symptoms worsen or fail to improve.   Saralyn Pilar, DO [facility] 02/27/2023, 2:23 PM

## 2023-03-02 ENCOUNTER — Encounter: Payer: Self-pay | Admitting: Urology

## 2023-03-02 ENCOUNTER — Ambulatory Visit (INDEPENDENT_AMBULATORY_CARE_PROVIDER_SITE_OTHER): Payer: 59 | Admitting: Urology

## 2023-03-02 VITALS — BP 137/74 | HR 59 | Ht 66.0 in | Wt 160.0 lb

## 2023-03-02 DIAGNOSIS — R310 Gross hematuria: Secondary | ICD-10-CM

## 2023-03-02 NOTE — Progress Notes (Signed)
I, Ross Riley, acting as a scribe for Ross Altes, MD., have documented all relevant documentation on the behalf of Ross Altes, MD, as directed by Ross Altes, MD while in the presence of Ross Altes, MD.    03/02/2023 4:56 PM   Ross Riley April 27, 1950 [id number]  Referring provider: Smitty Cords, DO [address] [place],  [place] [address]  Chief Complaint  Patient presents with   Hematuria    HPI: Ross Riley is a 72 y.o. male referred for history of urinary retention and gross hematuria.   [facility] ED visit 02/17/23 for gross hematuria with clot retention. Urinalysis grossly bloody with >50 RBC/WBC. Foley catheter was placed and at the time of discharge the urine was described as pink in color. Urine culture was sent and he was started empirically on cefpidoxine. Urine culture was subsequently negative.  He returned to the ED the following day with an obstructed Foley catheter. His Foley catheter was removed, however the ED was unable to reinsert a new catheter. He subsequently voided 200 mL and a bladder scan showed 50 mL of urine. He has been voiding without problems since that time and denies recurrent gross hematuria. He was on aspirin and Brilinta, and his aspirin was discontinued. However, he has continued on Belinta.  He underwent a TURP by Dr. Sherryl Barters 12/06/2014. I saw him in July 2020 with complaints of erectile dysfunction and ejaculatory dysfunction. CT abdomen pelvis with contrast was performed on 06/15/2022, which showed no GU abnormalities.   PMH: Past Medical History:  Diagnosis Date   Arthritis    BPH (benign prostatic hyperplasia)    Chronic kidney disease    kidney stones   Dystonia    ED (erectile dysfunction)    GERD (gastroesophageal reflux disease)    Glaucoma (increased eye pressure)    Hematuria    Hemorrhoids    Hyperlipidemia    Hypertension    Thyroid disease     Surgical History: Past Surgical History:   Procedure Laterality Date   CATARACT EXTRACTION Bilateral    CORONARY/GRAFT ACUTE MI REVASCULARIZATION N/A 12/20/2022   Procedure: Coronary/Graft Acute MI Revascularization;  Surgeon: Iran Ouch, MD;  Location: [facility] INVASIVE CV LAB;  Service: Cardiovascular;  Laterality: N/A;   EXTRACORPOREAL SHOCK WAVE LITHOTRIPSY Right    JOINT REPLACEMENT Left 08/2016   left knee, [place]   KIDNEY STONE SURGERY  2009   LEFT HEART CATH AND CORONARY ANGIOGRAPHY N/A 12/20/2022   Procedure: LEFT HEART CATH AND CORONARY ANGIOGRAPHY;  Surgeon: Iran Ouch, MD;  Location: [facility] INVASIVE CV LAB;  Service: Cardiovascular;  Laterality: N/A;   PROSTATE SURGERY  2011   TRANSURETHRAL RESECTION OF PROSTATE N/A 12/06/2014   Procedure: TRANSURETHRAL RESECTION OF THE PROSTATE (TURP);  Surgeon: Hildred Laser, MD;  Location: [facility] ORS;  Service: Urology;  Laterality: N/A;    Home Medications:  Allergies as of 03/02/2023       Reactions   Metformin And Related Other (See Comments)   Headache and dizzines   Metformin Hcl Other (See Comments)   headache and dizziness        Medication List        Accurate as of March 02, 2023  4:56 PM. If you have any questions, ask your nurse or doctor.          STOP taking these medications    aspirin 81 MG chewable tablet Stopped by: Ross Riley  TAKE these medications    Accu-Chek Aviva Plus test strip Generic drug: glucose blood Use to check blood sugar up to 1 x per day   Accu-Chek Aviva Plus w/Device Kit Use to check blood sugar up to 1 x per day   Accu-Chek Softclix Lancets lancets Use to check blood sugar up to 1 x per day   atorvastatin 80 MG tablet Commonly known as: LIPITOR Take 1 tablet (80 mg total) by mouth at bedtime.   carvedilol 3.125 MG tablet Commonly known as: COREG Tome 1 tableta (3.125 mg en total) por va oral 2 (dos) veces al da con una comida. (Take 1 tablet (3.125 mg total) by mouth 2 (two) times  daily with a meal.)   celecoxib 200 MG capsule Commonly known as: CELEBREX Take 200 mg by mouth 2 (two) times daily as needed for mild pain (pain score 1-3) or moderate pain (pain score 4-6).   dicyclomine 10 MG capsule Commonly known as: BENTYL Take 1 capsule (10 mg total) by mouth 4 (four) times daily -  before meals and at bedtime. As needed for abdominal pain cramping   dorzolamide-timolol 2-0.5 % ophthalmic solution Commonly known as: COSOPT Place 1 drop into both eyes 2 (two) times daily.   empagliflozin 10 MG Tabs tablet Commonly known as: JARDIANCE Tome 1 tableta (10 mg en total) por va oral diariamente. (Take 1 tablet (10 mg total) by mouth daily.)   folic acid 1 MG tablet Commonly known as: FOLVITE Tome 1 tableta (1 mg en total) por va oral diariamente. (Take 1 tablet (1 mg total) by mouth daily.)   gabapentin 300 MG capsule Commonly known as: NEURONTIN Take 1 capsule (300 mg total) by mouth 2 (two) times daily. What changed:  how much to take when to take this additional instructions   isosorbide mononitrate 30 MG 24 hr tablet Commonly known as: IMDUR Take 0.5 tablets (15 mg total) by mouth daily.   latanoprost 0.005 % ophthalmic solution Commonly known as: XALATAN Place 1 drop into both eyes at bedtime.   levothyroxine 75 MCG tablet Commonly known as: SYNTHROID Take 75 mcg by mouth daily before breakfast.   losartan 25 MG tablet Commonly known as: COZAAR Tome 1 tableta (25 mg en total) por va oral diariamente. (Take 1 tablet (25 mg total) by mouth daily.)   multivitamin with minerals Tabs tablet Tome 1 tableta por va oral diariamente. (Take 1 tablet by mouth daily.)   nitroGLYCERIN 0.4 MG SL tablet Commonly known as: NITROSTAT Place 1 tablet (0.4 mg total) under the tongue every 5 (five) minutes as needed for chest pain.   omeprazole 40 MG capsule Commonly known as: PRILOSEC TAKE 1 CAPSULE(40 MG) BY MOUTH DAILY BEFORE BREAKFAST   ticagrelor  90 MG Tabs tablet Commonly known as: BRILINTA Tome 1 tableta (90 mg en total) por va oral 2 (dos) veces al [employer]. (Take 1 tablet (90 mg total) by mouth 2 (two) times daily.)        Allergies:  Allergies  Allergen Reactions   Metformin And Related Other (See Comments)    Headache and dizzines   Metformin Hcl Other (See Comments)    headache and dizziness    Family History: Family History  Problem Relation Age of Onset   Heart disease Mother    Hypertension Brother     Social History:  reports that he quit smoking about 32 years ago. His smoking use included cigarettes. He started smoking about 64 years ago. He has  a 32 pack-year smoking history. He has quit using smokeless tobacco. He reports current alcohol use of about 7.0 standard drinks of alcohol per week. He reports that he does not use drugs.   Physical Exam: BP 137/74   Pulse (!) 59   Ht 5\' 6"  (1.676 m)   Wt 160 lb (72.6 kg)   BMI 25.82 kg/m   Constitutional:  Alert and oriented, No acute distress. HEENT: [place] AT Respiratory: Normal respiratory effort, no increased work of breathing. Psychiatric: Normal mood and affect.  Urinalysis Dipstick 3+ glucose/1+ blood, Microscopy 0-5 WBC/3-10 RBC.   Pertinent Imaging: CT was personally reviewed and interpreted.   CT  EXAM: CT ABDOMEN AND PELVIS WITH CONTRAST   TECHNIQUE: Multidetector CT imaging of the abdomen and pelvis was performed using the standard protocol following bolus administration of intravenous contrast.   RADIATION DOSE REDUCTION: This exam was performed according to the departmental dose-optimization program which includes automated exposure control, adjustment of the mA and/or kV according to patient size and/or use of iterative reconstruction technique.   CONTRAST:  OMNIPAQUE IOHEXOL 300 MG/ML  SOLN   COMPARISON:  September 15, 2006.   FINDINGS: Lower chest: No acute abnormality.   Hepatobiliary: No focal liver abnormality is seen. No  gallstones, gallbladder wall thickening, or biliary dilatation.   Pancreas: Unremarkable. No pancreatic ductal dilatation or surrounding inflammatory changes.   Spleen: Normal in size without focal abnormality.   Adrenals/Urinary Tract: Adrenal glands are unremarkable. Kidneys are normal, without renal calculi, focal lesion, or hydronephrosis. Bladder is unremarkable.   Stomach/Bowel: Stomach and appendix are unremarkable. There is no evidence of bowel obstruction. Moderate to severe diverticulitis is seen involving the proximal sigmoid colon. No abscess is noted.   Vascular/Lymphatic: Aortic atherosclerosis. No enlarged abdominal or pelvic lymph nodes.   Reproductive: Stable mild prostatic enlargement.   Other: Small fat containing left inguinal hernia. No ascites is noted.   Musculoskeletal: No acute or significant osseous findings.   IMPRESSION: Moderate to severe diverticulitis is seen involving the proximal sigmoid colon. No definite abscess is noted.   Stable mild prostatic enlargement.   Small fat containing left inguinal hernia.   Aortic Atherosclerosis (ICD10-I70.0).     Electronically Signed   By: Lupita Raider M.D.   On: 06/15/2022 09:29   Assessment & Plan:    1. Gross hematuria He had a negative CT abdomen/pelvis with contrast April 2024. At this point, will schedule a cystoscopy for lower tract evaluation and he was in agreement with this procedure.  If no significant abnormalities noted on cystoscopy, will schedule CT urogram.   I have reviewed the above documentation for accuracy and completeness, and I agree with the above.   Ross Altes, MD  [facility] [address], [address] [place], [place] [address] [phone]

## 2023-03-03 LAB — URINALYSIS, COMPLETE
Bilirubin, UA: NEGATIVE
Ketones, UA: NEGATIVE
Leukocytes,UA: NEGATIVE
Nitrite, UA: NEGATIVE
Protein,UA: NEGATIVE
Specific Gravity, UA: 1.015 (ref 1.005–1.030)
Urobilinogen, Ur: 0.2 mg/dL (ref 0.2–1.0)
pH, UA: 5.5 (ref 5.0–7.5)

## 2023-03-03 LAB — MICROSCOPIC EXAMINATION

## 2023-03-05 DIAGNOSIS — H8113 Benign paroxysmal vertigo, bilateral: Secondary | ICD-10-CM | POA: Diagnosis not present

## 2023-03-05 DIAGNOSIS — K5909 Other constipation: Secondary | ICD-10-CM | POA: Diagnosis not present

## 2023-03-05 DIAGNOSIS — E039 Hypothyroidism, unspecified: Secondary | ICD-10-CM | POA: Diagnosis not present

## 2023-03-05 DIAGNOSIS — G8929 Other chronic pain: Secondary | ICD-10-CM | POA: Diagnosis not present

## 2023-03-05 DIAGNOSIS — K5790 Diverticulosis of intestine, part unspecified, without perforation or abscess without bleeding: Secondary | ICD-10-CM | POA: Diagnosis not present

## 2023-03-05 DIAGNOSIS — I255 Ischemic cardiomyopathy: Secondary | ICD-10-CM | POA: Diagnosis not present

## 2023-03-05 DIAGNOSIS — Z7982 Long term (current) use of aspirin: Secondary | ICD-10-CM | POA: Diagnosis not present

## 2023-03-05 DIAGNOSIS — Z7984 Long term (current) use of oral hypoglycemic drugs: Secondary | ICD-10-CM | POA: Diagnosis not present

## 2023-03-05 DIAGNOSIS — E785 Hyperlipidemia, unspecified: Secondary | ICD-10-CM | POA: Diagnosis not present

## 2023-03-05 DIAGNOSIS — H409 Unspecified glaucoma: Secondary | ICD-10-CM | POA: Diagnosis not present

## 2023-03-05 DIAGNOSIS — E1136 Type 2 diabetes mellitus with diabetic cataract: Secondary | ICD-10-CM | POA: Diagnosis not present

## 2023-03-05 DIAGNOSIS — D649 Anemia, unspecified: Secondary | ICD-10-CM | POA: Diagnosis not present

## 2023-03-05 DIAGNOSIS — I10 Essential (primary) hypertension: Secondary | ICD-10-CM | POA: Diagnosis not present

## 2023-03-05 DIAGNOSIS — K219 Gastro-esophageal reflux disease without esophagitis: Secondary | ICD-10-CM | POA: Diagnosis not present

## 2023-03-05 DIAGNOSIS — G248 Other dystonia: Secondary | ICD-10-CM | POA: Diagnosis not present

## 2023-03-05 DIAGNOSIS — I2102 ST elevation (STEMI) myocardial infarction involving left anterior descending coronary artery: Secondary | ICD-10-CM | POA: Diagnosis not present

## 2023-03-05 DIAGNOSIS — E1169 Type 2 diabetes mellitus with other specified complication: Secondary | ICD-10-CM | POA: Diagnosis not present

## 2023-03-05 DIAGNOSIS — M25511 Pain in right shoulder: Secondary | ICD-10-CM | POA: Diagnosis not present

## 2023-03-05 DIAGNOSIS — M159 Polyosteoarthritis, unspecified: Secondary | ICD-10-CM | POA: Diagnosis not present

## 2023-03-05 DIAGNOSIS — I25118 Atherosclerotic heart disease of native coronary artery with other forms of angina pectoris: Secondary | ICD-10-CM | POA: Diagnosis not present

## 2023-03-11 ENCOUNTER — Emergency Department
Admission: EM | Admit: 2023-03-11 | Discharge: 2023-03-11 | Disposition: A | Discharge status: Home / Self Care | Payer: 59 | Attending: Emergency Medicine | Admitting: Emergency Medicine

## 2023-03-11 ENCOUNTER — Telehealth: Payer: Self-pay | Admitting: Family Medicine

## 2023-03-11 ENCOUNTER — Emergency Department: Payer: 59

## 2023-03-11 ENCOUNTER — Other Ambulatory Visit: Payer: Self-pay

## 2023-03-11 DIAGNOSIS — Z20822 Contact with and (suspected) exposure to covid-19: Secondary | ICD-10-CM | POA: Insufficient documentation

## 2023-03-11 DIAGNOSIS — J209 Acute bronchitis, unspecified: Secondary | ICD-10-CM | POA: Insufficient documentation

## 2023-03-11 DIAGNOSIS — R0602 Shortness of breath: Secondary | ICD-10-CM | POA: Diagnosis not present

## 2023-03-11 DIAGNOSIS — N189 Chronic kidney disease, unspecified: Secondary | ICD-10-CM | POA: Diagnosis not present

## 2023-03-11 DIAGNOSIS — I129 Hypertensive chronic kidney disease with stage 1 through stage 4 chronic kidney disease, or unspecified chronic kidney disease: Secondary | ICD-10-CM | POA: Insufficient documentation

## 2023-03-11 DIAGNOSIS — R0789 Other chest pain: Secondary | ICD-10-CM

## 2023-03-11 LAB — BASIC METABOLIC PANEL
Anion gap: 9 (ref 5–15)
BUN: 11 mg/dL (ref 8–23)
CO2: 25 mmol/L (ref 22–32)
Calcium: 8.7 mg/dL — ABNORMAL LOW (ref 8.9–10.3)
Chloride: 107 mmol/L (ref 98–111)
Creatinine, Ser: 0.92 mg/dL (ref 0.61–1.24)
GFR, Estimated: >60 mL/min (ref >60)
Glucose, Bld: 136 mg/dL — ABNORMAL HIGH (ref 70–99)
Potassium: 3.8 mmol/L (ref 3.5–5.1)
Sodium: 141 mmol/L (ref 135–145)

## 2023-03-11 LAB — CBC
HCT: 44.9 % (ref 39.0–52.0)
Hemoglobin: 14.4 g/dL (ref 13.0–17.0)
MCH: 30 pg (ref 26.0–34.0)
MCHC: 32.1 g/dL (ref 30.0–36.0)
MCV: 93.5 fL (ref 80.0–100.0)
Platelets: 200 10*3/uL (ref 150–400)
RBC: 4.8 MIL/uL (ref 4.22–5.81)
RDW: 13 % (ref 11.5–15.5)
WBC: 7.9 10*3/uL (ref 4.0–10.5)
nRBC: 0 % (ref 0.0–0.2)

## 2023-03-11 LAB — RESP PANEL BY RT-PCR (RSV, FLU A&B, COVID)  RVPGX2
Influenza A by PCR: NEGATIVE
Influenza B by PCR: NEGATIVE
Resp Syncytial Virus by PCR: NEGATIVE
SARS Coronavirus 2 by RT PCR: NEGATIVE

## 2023-03-11 LAB — TROPONIN I (HIGH SENSITIVITY)
Troponin I (High Sensitivity): 10 ng/L (ref <18)
Troponin I (High Sensitivity): 9 ng/L (ref <18)

## 2023-03-11 MED ORDER — AZITHROMYCIN 250 MG PO TABS: ORAL_TABLET | ORAL | 0 refills | Status: AC

## 2023-03-11 MED ORDER — AZITHROMYCIN 500 MG PO TABS
500.0000 mg | ORAL_TABLET | Freq: Once | ORAL | Status: AC
Start: 2023-03-11 — End: 2023-03-11
  Administered 2023-03-11: 500 mg via ORAL
  Filled 2023-03-11: qty 1

## 2023-03-11 NOTE — Telephone Encounter (Signed)
 Nothing further. This is an FYI  Saralyn Pilar, DO [facility] 03/11/2023, 5:08 PM

## 2023-03-11 NOTE — ED Triage Notes (Signed)
Refer to First Nurse Note

## 2023-03-11 NOTE — Telephone Encounter (Signed)
 Shireen OT called from [facility] said patient has refused therapy for this week.

## 2023-03-11 NOTE — ED Triage Notes (Signed)
 Patient from [facility] for chest pain over the past few days associated with [facility]; also having flu like symptoms.

## 2023-03-11 NOTE — ED Provider Notes (Addendum)
 [facility] Provider Note    Event Date/Time   First MD Initiated Contact with Patient 03/11/23 1922     (approximate)  History   Chief Complaint: Chest Pain  HPI  Ross Riley is a 73 y.o. male with a past medical history of arthritis, CKD, gastric reflux, hypertension, hyperlipidemia presents to the emergency department for 5 days of cough congestion low-grade fever mild chest discomfort.  According to the patient for the past 5 days he initially had a fever that was elevated high but now he has had low-grade fever he continues to have cough and congestion.  Patient states mild chest pain but only when he is coughing.  Physical Exam   Triage Vital Signs: ED Triage Vitals  Encounter Vitals Group     BP 03/11/23 1631 96/62     Systolic BP Percentile --      Diastolic BP Percentile --      Pulse Rate 03/11/23 1631 61     Resp 03/11/23 1631 18     Temp 03/11/23 1631 98.4 F (36.9 C)     Temp Source 03/11/23 1631 Oral     SpO2 03/11/23 1631 97 %     Weight 03/11/23 1630 159 lb 13.3 oz (72.5 kg)     Height 03/11/23 1630 5' 6 (1.676 m)     Head Circumference --      Peak Flow --      Pain Score 03/11/23 1629 5     Pain Loc --      Pain Education --      Exclude from Growth Chart --     Most recent vital signs: Vitals:   03/11/23 1631 03/11/23 1951  BP: 96/62   Pulse: 61   Resp: 18   Temp: 98.4 F (36.9 C)   SpO2: 97% 97%    General: Awake, no distress.  CV:  Good peripheral perfusion.  Regular rate and rhythm  Resp:  Normal effort.  Equal breath sounds bilaterally.  Abd:  No distention.  Soft, nontender.  No rebound or guarding.   ED Results / Procedures / Treatments   EKG  EKG viewed and interpreted by myself shows a normal sinus rhythm at 69 bpm with a narrow QRS, normal axis, normal intervals, nonspecific ST changes.  RADIOLOGY  I reviewed and interpreted chest x-ray images.  No consolidation on my evaluation. Radiology is  read the x-ray is negative  MEDICATIONS ORDERED IN ED: Medications - No data to display   IMPRESSION / MDM / ASSESSMENT AND PLAN / ED COURSE  I reviewed the triage vital signs and the nursing notes.  Patient's presentation is most consistent with acute presentation with potential threat to life or bodily function.  Patient presents to the emergency department for several days of cough congestion chest discomfort.  Describes the chest discomfort is only when the patient is coughing.  Patient states initially he had a fever but now he does not.  Patient's workup in the emergency department shows a reassuring CBC, reassuring chemistry, troponin is negative, COVID/flu/RSV is negative.  Chest x-ray is clear.  Patient states a history of acute bronchitis in the past which this feels identical.  We will cover with Zithromax  as a precaution have the patient follow-up with his doctor.   FINAL CLINICAL IMPRESSION(S) / ED DIAGNOSES   Acute bronchitis Chest pain    Note:  This document was prepared using Dragon voice recognition software and may include unintentional dictation errors.  Dorothyann Drivers, MD 03/11/23 2125    Dorothyann Drivers, MD 03/11/23 2125

## 2023-03-11 NOTE — ED Provider Triage Note (Signed)
 Emergency Medicine Provider Triage Evaluation Note  Ross Riley , a 73 y.o. male  was evaluated in triage.  Pt complains of cough associated with chest pain, nasal congestion, ear pain.  Patient was referred from [facility] for chest pain  Review of Systems  Positive:  Negative:   Physical Exam  BP 96/62 (BP Location: Right Arm)   Pulse 61   Temp 98.4 F (36.9 C) (Oral)   Resp 18   Ht 5' 6 (1.676 m)   Wt 72.5 kg   SpO2 97%   BMI 25.80 kg/m  Gen:   Awake, no distress  Resp:  Normal effort no wheezing MSK:   Moves extremities without difficulty  Other:    Medical Decision Making  Medically screening exam initiated at 4:40 PM.  Appropriate orders placed.  JOWEL WALTNER was informed that the remainder of the evaluation will be completed by another provider, this initial triage assessment does not replace that evaluation, and the importance of remaining in the ED until their evaluation is complete.  Patient with upper respiratory infection, chest pain.  Checks x-ray and labs   Janit Kast, [place] 03/11/23 1641

## 2023-03-12 ENCOUNTER — Ambulatory Visit: Payer: 59 | Admitting: Internal Medicine

## 2023-03-12 ENCOUNTER — Ambulatory Visit: Payer: Self-pay

## 2023-03-12 ENCOUNTER — Telehealth: Payer: Self-pay

## 2023-03-12 DIAGNOSIS — J209 Acute bronchitis, unspecified: Secondary | ICD-10-CM

## 2023-03-12 MED ORDER — ALBUTEROL SULFATE HFA 108 (90 BASE) MCG/ACT IN AERS: 2.0000 | INHALATION_SPRAY | Freq: Four times a day (QID) | RESPIRATORY_TRACT | 0 refills | Status: DC | PRN

## 2023-03-12 MED ORDER — PREDNISONE 20 MG PO TABS: ORAL_TABLET | ORAL | 0 refills | Status: DC

## 2023-03-12 MED ORDER — PSEUDOEPH-BROMPHEN-DM 30-2-10 MG/5ML PO SYRP: 5.0000 mL | ORAL_SOLUTION | Freq: Four times a day (QID) | ORAL | 0 refills | Status: DC | PRN

## 2023-03-12 NOTE — Addendum Note (Signed)
 Addended by: Smitty Cords on: 03/12/2023 01:10 PM   Modules accepted: Orders

## 2023-03-12 NOTE — Transitions of Care (Post Inpatient/ED Visit) (Signed)
 03/12/2023  Name: Ross Riley MRN: [id number] DOB: [birth year]  Today's TOC FU Call Status: Today's TOC FU Call Status:: Successful TOC FU Call Completed TOC FU Call Complete Date: 03/12/23 Patient's Name and Date of Birth confirmed.  Transition Care Management Follow-up Telephone Call Date of Discharge: 03/10/23 Discharge Facility: [facility]) Type of Discharge: Emergency Department Reason for ED Visit: Other: (chest pain) How have you been since you were released from the hospital?: Better Any questions or concerns?: No  Items Reviewed: Did you receive and understand the discharge instructions provided?: Yes Medications obtained,verified, and reconciled?: Yes (Medications Reviewed) Any new allergies since your discharge?: No Dietary orders reviewed?: Yes Do you have support at home?: Yes People in Home: spouse  Medications Reviewed Today: Medications Reviewed Today     Reviewed by Emmitt Pan, LPN (Licensed Practical Nurse) on 03/12/23 at 1543  Med List Status: <None>   Medication Order Taking? Sig Documenting Provider Last Dose Status Informant  ACCU-CHEK AVIVA PLUS test strip [id number] No Use to check blood sugar up to 1 x per day Edman Marsa JINNY, DO Taking Active   Accu-Chek Softclix Lancets lancets [id number] No Use to check blood sugar up to 1 x per day Edman Marsa JINNY, DO Taking Active   albuterol  (VENTOLIN  HFA) 108 (90 Base) MCG/ACT inhaler [id number]  Inhale 2 puffs into the lungs every 6 (six) hours as needed for wheezing or shortness of breath. Edman Marsa JINNY, DO  Active   atorvastatin  (LIPITOR ) 80 MG tablet [id number] No Take 1 tablet (80 mg total) by mouth at bedtime. Edman Marsa JINNY, DO Taking Active   azithromycin  (ZITHROMAX  Z-PAK) 250 MG tablet [id number]  Take 2 tablets (500 mg) on  Day 1,  followed by 1 tablet (250 mg) once daily on Days 2 through 5. Dorothyann Drivers, MD  Active   Blood  Glucose Monitoring Suppl (ACCU-CHEK AVIVA PLUS) w/Device KIT [id number] No Use to check blood sugar up to 1 x per day Edman Marsa JINNY, DO Taking Active   brompheniramine-pseudoephedrine-DM 30-2-10 MG/5ML syrup [id number]  Take 5 mLs by mouth 4 (four) times daily as needed. Edman Marsa JINNY, DO  Active   carvedilol  (COREG ) 3.125 MG tablet [id number] No Take 1 tablet (3.125 mg total) by mouth 2 (two) times daily with a meal. Tobie Calix, MD Taking Active   celecoxib (CELEBREX) 200 MG capsule [id number] No Take 200 mg by mouth 2 (two) times daily as needed for mild pain (pain score 1-3) or moderate pain (pain score 4-6). [provider] Taking Active Pharmacy Records  dicyclomine  (BENTYL ) 10 MG capsule [id number] No Take 1 capsule (10 mg total) by mouth 4 (four) times daily -  before meals and at bedtime. As needed for abdominal pain cramping Edman Marsa JINNY, DO Taking Active   dorzolamide -timolol  (COSOPT ) 22.3-6.8 MG/ML ophthalmic solution [id number] No Place 1 drop into both eyes 2 (two) times daily. [provider] Taking Active Pharmacy Records  empagliflozin  (JARDIANCE ) 10 MG TABS tablet [id number] No Take 1 tablet (10 mg total) by mouth daily. Tobie Calix, MD Taking Active   folic acid  (FOLVITE ) 1 MG tablet [id number] No Take 1 tablet (1 mg total) by mouth daily. Tobie Calix, MD Taking Active   gabapentin  (NEURONTIN ) 300 MG capsule [id number] No Take 1 capsule (300 mg total) by mouth 2 (two) times daily.  Patient taking differently: Take 300-600 mg by mouth See admin instructions. Take 1 capsule (300mg ) by mouth every morning, 1 capsule (300mg ) by  mouth every day at noon and take 2 capsule (600mg ) by mouth at bedtime   Edman Marsa PARAS, DO Taking Active Pharmacy Records  isosorbide  mononitrate (IMDUR ) 30 MG 24 hr tablet [id number] No Take 0.5 tablets (15 mg total) by mouth daily. Loistine Sober, NP Taking Active   latanoprost  (XALATAN ) 0.005 % ophthalmic  solution [id number] No Place 1 drop into both eyes at bedtime. [provider] Taking Active Pharmacy Records  levothyroxine  (SYNTHROID ) 75 MCG tablet [id number] No Take 75 mcg by mouth daily before breakfast. [provider] Taking Active Pharmacy Records  losartan  (COZAAR ) 25 MG tablet [id number] No Take 1 tablet (25 mg total) by mouth daily. Patel, Sona, MD Taking Active   Multiple Vitamin (MULTIVITAMIN WITH MINERALS) TABS tablet [id number] No Take 1 tablet by mouth daily. Tobie Calix, MD Taking Active   nitroGLYCERIN  (NITROSTAT ) 0.4 MG SL tablet [id number] No Place 1 tablet (0.4 mg total) under the tongue every 5 (five) minutes as needed for chest pain. Loistine Sober, NP Taking Active   omeprazole  (PRILOSEC) 40 MG capsule [id number] No TAKE 1 CAPSULE(40 MG) BY MOUTH DAILY BEFORE BREAKFAST Karamalegos, Marsa PARAS, DO Taking Active   predniSONE  (DELTASONE ) 20 MG tablet [id number]  Take daily with food. Start with 60mg  (3 pills) x 2 days, then reduce to 40mg  (2 pills) x 2 days, then 20mg  (1 pill) x 3 days Edman Marsa PARAS, DO  Active   ticagrelor  (BRILINTA ) 90 MG TABS tablet [id number] No Take 1 tablet (90 mg total) by mouth 2 (two) times daily. Tobie Calix, MD Taking Active             Home Care and Equipment/Supplies: Were Home Health Services Ordered?: NA Any new equipment or medical supplies ordered?: NA  Functional Questionnaire: Do you need assistance with bathing/showering or dressing?: No Do you need assistance with meal preparation?: No Do you need assistance with eating?: No Do you have difficulty maintaining continence: No Do you need assistance with getting out of bed/getting out of a chair/moving?: No Do you have difficulty managing or taking your medications?: No  Follow up appointments reviewed: PCP Follow-up appointment confirmed?: Yes Date of PCP follow-up appointment?: 03/18/23 [facility]-up Provider[facility] Follow-up  appointment confirmed?: NA Do you need transportation to your follow-up appointment?: No Do you understand care options if your condition(s) worsen?: Yes-patient verbalized understanding    SIGNATURE Julian Lemmings, LPN [facility] Nurse Health Advisor Direct Dial [phone]

## 2023-03-12 NOTE — Telephone Encounter (Signed)
 Rx ordered for non antibiotic treatment of bronchitis  Prednisone taper, Bromfed cough syrup, Albuterol

## 2023-03-12 NOTE — Telephone Encounter (Signed)
 Pt called back. Pt would like cough medicine and prednisone prescribed.  Pt states he has an appt with urology on the 20th.

## 2023-03-12 NOTE — Telephone Encounter (Signed)
 I will need more specific information to address the request.  He was given Azithromycin  Zpak at hospital ED yesterday 03/11/23.  That is a common antibiotic that I have ordered for him in the past as well.  What medicine is she requesting?  We have also given cough syrup and prednisone  before for him too.  Please let me know what medicine is being requested and I will see if I can order it as a courtesy this time.  They should follow advice of Urologist regarding blood in urine.  Marsa Officer, DO [facility] 03/12/2023, 12:05 PM

## 2023-03-12 NOTE — Telephone Encounter (Signed)
 Chief Complaint: Blood in urine Symptoms: blood in urine  Frequency: 1 episode  Pertinent Negatives: Patient denies pain, fever, clots Disposition: [] ED /[] Urgent Care (no appt availability in office) / [x] Appointment(In office/virtual)/ []  [place] [facility]/ [] Home Care/ [] Refused Recommended Disposition /[] [place] [facility]/ []  Follow-up with PCP Additional Notes: Patient's daughter (signed DPR on file) states the patient had bright red blood in his urine last night after taking antibiotics in the ED yesterday. [name] stated the patient had blood in his urine while taking antibiotics last month as well.  Patient was seen by urology on 03/02/23 for symptoms. Cystoscopy has been ordered to evaluate lower tract. Patient is currently taking antibiotics to treat bronchitis. [name] wants to know if patient should continue taking antibiotics. Care advise was given and patient was scheduled for PCP first available appointment 03/18/23. Advised [name] to report symptoms to Urology office as well. Advised to continue to take antibiotics until receives additional recommendations from PCP.  Reason for Disposition  Blood in urine  (Exception: Could be normal menstrual bleeding.)  Answer Assessment - Initial Assessment Questions 1. COLOR of URINE: Describe the color of the urine.  (e.g., tea-colored, pink, red, bloody) Do you have blood clots in your urine? (e.g., none, pea, grape, small coin)     Red 2. ONSET: When did the bleeding start?      Last night 3. EPISODES: How many times has there been blood in the urine? or How many times today?     1  4. PAIN with URINATION: Is there any pain with passing your urine? If Yes, ask: How bad is the pain?  (Scale 1-10; or mild, moderate, severe)    - MILD: Complains slightly about urination hurting.    - MODERATE: Interferes with normal activities.      - SEVERE: Excruciating, unwilling or unable to urinate because of the pain.      No  5. FEVER:  Do you have a fever? If Yes, ask: What is your temperature, how was it measured, and when did it start?     No  6. ASSOCIATED SYMPTOMS: Are you passing urine more frequently than usual?     No  7. OTHER SYMPTOMS: Do you have any other symptoms? (e.g., back/flank pain, abdomen pain, vomiting)     No  Protocols used: Urine - Blood In-A-AH

## 2023-03-12 NOTE — Telephone Encounter (Signed)
 Spoke to [place], patient's daughter (Signed DPR on file). Notified her that PCP sent in non antibiotic treatment for bronchitis. [name] verbalized understanding.  Edman Marsa PARAS, DO   Creation Time: 03/12/2023  1:10 PM   Signed     Rx ordered for non antibiotic treatment of bronchitis   Prednisone  taper, Bromfed cough syrup, Albuterol 

## 2023-03-12 NOTE — Telephone Encounter (Signed)
 Copied from CRM [id number]. Topic: General - Inquiry >> Mar 12, 2023 10:28 AM Donnal HERO wrote: Reason for CRM: Pt daughter called back to see TE from NT 01/9. She asked if Dr. MARLA can give him the medicine that he usually gives him for bronchitis. He doesn't want to take the antibiotics because it causes him to have blood in his urine.  Please advise.

## 2023-03-15 ENCOUNTER — Other Ambulatory Visit: Payer: Self-pay | Admitting: Student

## 2023-03-16 ENCOUNTER — Other Ambulatory Visit: Payer: Self-pay | Admitting: Family Medicine

## 2023-03-16 DIAGNOSIS — J209 Acute bronchitis, unspecified: Secondary | ICD-10-CM

## 2023-03-17 DIAGNOSIS — R262 Difficulty in walking, not elsewhere classified: Secondary | ICD-10-CM | POA: Diagnosis not present

## 2023-03-17 DIAGNOSIS — M25572 Pain in left ankle and joints of left foot: Secondary | ICD-10-CM | POA: Diagnosis not present

## 2023-03-17 NOTE — Telephone Encounter (Signed)
 This is a refill request on his cough syrup that I ordered 5 days ago. I would not keep refilling it unless I have more information or an update. Can you check with patient / family for updates?  Marsa Officer, DO [address] [facility] 03/17/2023, 7:11 PM

## 2023-03-17 NOTE — Telephone Encounter (Signed)
 Requested medication (s) are due for refill today - provider review   Requested medication (s) are on the active medication list -yes  Future visit scheduled -yes  Last refill: 03/12/23  Notes to clinic: non delegated Rx  Requested Prescriptions  Pending Prescriptions Disp Refills   brompheniramine-pseudoephedrine-DM 30-2-10 MG/5ML syrup [Pharmacy Med Name: BROMPHEN/PSEUDO/DEXTRO HBR SYRUP] 118 mL 0    Sig: TAKE 5 ML BY MOUTH FOUR TIMES DAILY AS NEEDED     Not Delegated - Ear, Nose, and Throat:  Combination Products with Pseudoephedrine Failed - 03/17/2023 12:44 PM      Failed - This refill cannot be delegated      Passed - Last BP in normal range    BP Readings from Last 1 Encounters:  03/11/23 109/87         Passed - Valid encounter within last 12 months    Recent Outpatient Visits           2 weeks ago Gross hematuria   [place] [facility] Edman Marsa PARAS, DO   2 months ago Coronary artery disease of native artery of native heart with stable angina pectoris [facility])   [place] [facility] [place], Marsa PARAS, DO   4 months ago Type 2 diabetes mellitus with diabetic cataract, without long-term current use of insulin  [facility])   [place] [facility] [place], Marsa PARAS, DO   8 months ago Colicky LLQ abdominal pain   [place] [facility] [place], Marsa PARAS, DO   10 months ago Type 2 diabetes mellitus with diabetic cataract, without long-term current use of insulin  [facility])   [place] [facility] [place], Marsa PARAS, DO       Future Appointments             Tomorrow Edman, Marsa PARAS, DO [facility], [facility]   In 1 month Edman, Marsa PARAS, DO [facility], [facility]   In 1 month Perla, Evalene PARAS, MD [facility]               Requested Prescriptions  Pending  Prescriptions Disp Refills   brompheniramine-pseudoephedrine-DM 30-2-10 MG/5ML syrup [Pharmacy Med Name: BROMPHEN/PSEUDO/DEXTRO HBR SYRUP] 118 mL 0    Sig: TAKE 5 ML BY MOUTH FOUR TIMES DAILY AS NEEDED     Not Delegated - Ear, Nose, and Throat:  Combination Products with Pseudoephedrine Failed - 03/17/2023 12:44 PM      Failed - This refill cannot be delegated      Passed - Last BP in normal range    BP Readings from Last 1 Encounters:  03/11/23 109/87         Passed - Valid encounter within last 12 months    Recent Outpatient Visits           2 weeks ago Gross hematuria   [place] [facility] [place], Marsa PARAS, DO   2 months ago Coronary artery disease of native artery of native heart with stable angina pectoris [facility])   [place] [facility] [place], Marsa PARAS, DO   4 months ago Type 2 diabetes mellitus with diabetic cataract, without long-term current use of insulin  [facility])   [place] [facility] [place], Marsa PARAS, DO   8 months ago Colicky LLQ abdominal pain    [facility] [place], Marsa PARAS, [place]  10 months ago Type 2 diabetes mellitus with diabetic cataract, without long-term current use of insulin  [facility])   [place] [facility] [place], Marsa PARAS, DO       Future Appointments             Tomorrow Edman, Marsa PARAS, DO [facility], [facility]   In 1 month Edman, Marsa PARAS, DO  [facility], [facility]   In 1 month [place], Evalene PARAS, MD [facility]

## 2023-03-18 ENCOUNTER — Ambulatory Visit: Payer: 59 | Admitting: Family Medicine

## 2023-03-18 NOTE — Telephone Encounter (Signed)
 I haven't seen him for an office visit. All of the advice I have given was based off of ED visit 03/11/23 and the phone call.  There is not much more medical advice I have for him. He has been treated with all of the correct medications that should work for his symptoms and cough.  At this point if gradually improving, I would just suggest more time. If still not better by next week if he wants to do an appointment that is okay. But time may be what is needed now. Lingering cough can take 2+ weeks.   I will re order cough syrup.  Domingo Friend, DO [facility] 03/18/2023, 1:15 PM

## 2023-03-19 DIAGNOSIS — M25572 Pain in left ankle and joints of left foot: Secondary | ICD-10-CM | POA: Diagnosis not present

## 2023-03-19 DIAGNOSIS — R262 Difficulty in walking, not elsewhere classified: Secondary | ICD-10-CM | POA: Diagnosis not present

## 2023-03-20 ENCOUNTER — Telehealth: Payer: Self-pay | Admitting: Cardiovascular Disease

## 2023-03-20 MED ORDER — TICAGRELOR 90 MG PO TABS: 90.0000 mg | ORAL_TABLET | Freq: Two times a day (BID) | ORAL | 2 refills | Status: DC

## 2023-03-20 MED ORDER — EMPAGLIFLOZIN 10 MG PO TABS: 10.0000 mg | ORAL_TABLET | Freq: Every day | ORAL | 2 refills | Status: DC

## 2023-03-20 MED ORDER — LOSARTAN POTASSIUM 25 MG PO TABS: 25.0000 mg | ORAL_TABLET | Freq: Every day | ORAL | 2 refills | Status: DC

## 2023-03-20 MED ORDER — CARVEDILOL 3.125 MG PO TABS: 3.1250 mg | ORAL_TABLET | Freq: Two times a day (BID) | ORAL | 2 refills | Status: DC

## 2023-03-20 NOTE — Telephone Encounter (Signed)
Please advise on Folic Acid refill.  Not filled by Dr. Mariah Milling.

## 2023-03-20 NOTE — Telephone Encounter (Signed)
Last office visit: 01/23/23 with plan to f/u 3 months. next office visit: 05/04/23  Requested Prescriptions   Signed Prescriptions Disp Refills   ticagrelor (BRILINTA) 90 MG TABS tablet 60 tablet 2    Sig: Take 1 tablet (90 mg total) by mouth 2 (two) times daily.    Authorizing Provider: Antonieta Iba    Ordering User: Guerry Minors   empagliflozin (JARDIANCE) 10 MG TABS tablet 30 tablet 2    Sig: Take 1 tablet (10 mg total) by mouth daily.    Authorizing Provider: Antonieta Iba    Ordering User: Guerry Minors   losartan (COZAAR) 25 MG tablet 30 tablet 2    Sig: Take 1 tablet (25 mg total) by mouth daily.    Authorizing Provider: Antonieta Iba    Ordering User: Feliberto Harts L   carvedilol (COREG) 3.125 MG tablet 60 tablet 2    Sig: Take 1 tablet (3.125 mg total) by mouth 2 (two) times daily with a meal.    Authorizing Provider: Antonieta Iba    Ordering User: Guerry Minors

## 2023-03-20 NOTE — Telephone Encounter (Signed)
Called and spoke with patient. Informed patient that he would need to follow up with PCP for folic acid refill. Patient verbalizes understanding.

## 2023-03-20 NOTE — Telephone Encounter (Signed)
*  STAT* If patient is at the pharmacy, call can be transferred to refill team.   1. Which medications need to be refilled? (please list name of each medication and dose if known)  folic acid (FOLVITE) 1 MG tablet ticagrelor (BRILINTA) 90 MG TABS tablet empagliflozin (JARDIANCE) 10 MG TABS tablet losartan (COZAAR) 25 MG tablet carvedilol (COREG) 3.125 MG tablet  2. Which pharmacy/location (including street and city if local pharmacy) is medication to be sent to? [id number] - [place], [place] - [address] AT [facility] OF [address]  3. Do they need a 30 day or 90 day supply?   90 day supply  Patient states he will be completely out of medication tomorrow.

## 2023-03-23 ENCOUNTER — Ambulatory Visit (INDEPENDENT_AMBULATORY_CARE_PROVIDER_SITE_OTHER): Payer: 59 | Admitting: Urology

## 2023-03-23 VITALS — BP 122/76 | HR 65 | Ht 66.0 in | Wt 166.0 lb

## 2023-03-23 DIAGNOSIS — R310 Gross hematuria: Secondary | ICD-10-CM | POA: Diagnosis not present

## 2023-03-23 DIAGNOSIS — R3129 Other microscopic hematuria: Secondary | ICD-10-CM | POA: Diagnosis not present

## 2023-03-23 LAB — URINALYSIS, COMPLETE
Bilirubin, UA: NEGATIVE
Ketones, UA: NEGATIVE
Leukocytes,UA: NEGATIVE
Nitrite, UA: NEGATIVE
Protein,UA: NEGATIVE
Specific Gravity, UA: 1.01 (ref 1.005–1.030)
Urobilinogen, Ur: 0.2 mg/dL (ref 0.2–1.0)
pH, UA: 5.5 (ref 5.0–7.5)

## 2023-03-23 LAB — MICROSCOPIC EXAMINATION: Bacteria, UA: NONE SEEN

## 2023-03-23 MED ORDER — FINASTERIDE 5 MG PO TABS: 5.0000 mg | ORAL_TABLET | Freq: Every day | ORAL | 3 refills | Status: DC

## 2023-03-23 NOTE — Progress Notes (Unsigned)
   03/23/23  CC:  Chief Complaint  Patient presents with   Cysto    HPI: Refer to my previous note 03/02/2023.  He denies recurrent gross hematuria  Blood pressure 122/76, pulse 65, height 5\' 6"  (1.676 m), weight 166 lb (75.3 kg). NED. A&Ox3.   No respiratory distress   Abd soft, NT, ND Normal phallus with bilateral descended testicles  Cystoscopy Procedure Note  Patient identification was confirmed, informed consent was obtained, and patient was prepped using Betadine solution.  Lidocaine jelly was administered per urethral meatus.     Pre-Procedure: - Inspection reveals a normal caliber urethral meatus.  Procedure: The flexible cystoscope was introduced without difficulty - No urethral strictures/lesions are present. -Prominent lateral lobe enlargement/regrowth prostate with hypervascularity - Elevated bladder neck with inflammatory change - Bilateral ureteral orifices identified - Bladder mucosa  reveals no ulcers, tumors, or lesions - No bladder stones - Mild trabeculation  Retroflexion shows intravesical median lobe with prominent hypervascularity   Post-Procedure: - Patient tolerated the procedure well  Assessment/ Plan: Gross hematuria most likely secondary to BPH We discussed options of observation, starting 5-ARI and surgical management. He has elected medical management and Rx finasteride sent to pharmacy.  Call for recurrent gross hematuria.  Follow-up 6 months.   [name], MD

## 2023-03-24 DIAGNOSIS — M25572 Pain in left ankle and joints of left foot: Secondary | ICD-10-CM | POA: Diagnosis not present

## 2023-03-24 DIAGNOSIS — R262 Difficulty in walking, not elsewhere classified: Secondary | ICD-10-CM | POA: Diagnosis not present

## 2023-03-26 ENCOUNTER — Ambulatory Visit: Payer: 59 | Admitting: Urology

## 2023-03-26 DIAGNOSIS — R262 Difficulty in walking, not elsewhere classified: Secondary | ICD-10-CM | POA: Diagnosis not present

## 2023-03-26 DIAGNOSIS — M25572 Pain in left ankle and joints of left foot: Secondary | ICD-10-CM | POA: Diagnosis not present

## 2023-03-31 DIAGNOSIS — R262 Difficulty in walking, not elsewhere classified: Secondary | ICD-10-CM | POA: Diagnosis not present

## 2023-03-31 DIAGNOSIS — M25572 Pain in left ankle and joints of left foot: Secondary | ICD-10-CM | POA: Diagnosis not present

## 2023-04-01 ENCOUNTER — Other Ambulatory Visit: Payer: 59 | Admitting: Urology

## 2023-04-02 ENCOUNTER — Ambulatory Visit (INDEPENDENT_AMBULATORY_CARE_PROVIDER_SITE_OTHER): Payer: 59 | Admitting: Family Medicine

## 2023-04-02 ENCOUNTER — Encounter: Payer: Self-pay | Admitting: Family Medicine

## 2023-04-02 VITALS — BP 144/82 | HR 51 | Ht 66.0 in | Wt 165.0 lb

## 2023-04-02 DIAGNOSIS — R262 Difficulty in walking, not elsewhere classified: Secondary | ICD-10-CM | POA: Diagnosis not present

## 2023-04-02 DIAGNOSIS — M545 Low back pain, unspecified: Secondary | ICD-10-CM

## 2023-04-02 DIAGNOSIS — M62830 Muscle spasm of back: Secondary | ICD-10-CM

## 2023-04-02 DIAGNOSIS — M25572 Pain in left ankle and joints of left foot: Secondary | ICD-10-CM | POA: Diagnosis not present

## 2023-04-02 MED ORDER — BACLOFEN 10 MG PO TABS: 5.0000 mg | ORAL_TABLET | Freq: Three times a day (TID) | ORAL | 1 refills | Status: DC | PRN

## 2023-04-02 MED ORDER — OXYCODONE HCL 5 MG PO TABS: 5.0000 mg | ORAL_TABLET | Freq: Four times a day (QID) | ORAL | 0 refills | Status: DC | PRN

## 2023-04-02 NOTE — Progress Notes (Signed)
Subjective:    Patient ID: Ross Riley, male    DOB: [birth year], 73 y.o.   MRN: [id number]  Ross Riley is a 73 y.o. male presenting on 04/02/2023 for Back Pain  Patient presents for a same day appointment.   HPI  Discussed the use of AI scribe software for clinical note transcription with the patient, who gave verbal consent to proceed.  History of Present Illness    The patient presents with back pain and stiffness.  He experiences back pain and stiffness, exacerbated by movement such as sitting, standing, and rotating torso / back to the LEFT. The pain persists throughout the day and is described as 'stiff and sore.' Seems to be episodic worse with transition sit to stand and other movement. He has some chronic lower extremity numbness tingling.  He has a history of using Celebrex for arthritis pain but is no longer taking it. He has not been using any over-the-counter medications for pain relief. In the past, he has used muscle relaxants such as baclofen, but does not recall if these were effective. Currently, he is using oxycodone for pain relief, which he finds helpful, although the pain returns after the medication's effects wear off. He takes only one oxycodone as needed, it is a previous rx from specialist. No longer active order. He is interested in another short supply. He has history of heart disease and is on blood thinner, and it is not safe to take anti inflammatories.        12/26/2022    2:03 PM 10/24/2022    2:25 PM 08/07/2022    2:31 PM  Depression screen PHQ 2/9  Decreased Interest 0 0 1  Down, Depressed, Hopeless 0 1 1  PHQ - 2 Score 0 1 2  Altered sleeping 0 0 1  Tired, decreased energy 0 1 1  Change in appetite 0 0 0  Feeling bad or failure about yourself  0 0 0  Trouble concentrating 0 0 0  Moving slowly or fidgety/restless 0 0 0  Suicidal thoughts 0 3 0  PHQ-9 Score 0 5 4  Difficult doing work/chores Not difficult at all Somewhat difficult Not  difficult at all       12/26/2022    2:03 PM 10/24/2022    2:26 PM 06/23/2022    4:34 PM 04/25/2022   10:24 AM  GAD 7 : Generalized Anxiety Score  Nervous, Anxious, on Edge 0 0 0 1  Control/stop worrying 0 0 0 1  Worry too much - different things 0 0 0 0  Trouble relaxing 0 1 0 1  Restless 0 1 0 0  Easily annoyed or irritable 0 0 0 0  Afraid - awful might happen 0 0 0 0  Total GAD 7 Score 0 2 0 3  Anxiety Difficulty Not difficult at all Not difficult at all  Not difficult at all    Social History   Tobacco Use   Smoking status: Former    Current packs/day: 0.00    Average packs/day: 1 pack/day for 32.0 years (32.0 ttl pk-yrs)    Types: Cigarettes    Start date: 03/04/1959    Quit date: 03/04/1991    Years since quitting: 32.1   Smokeless tobacco: Former  Building services engineer status: Never Used  Substance Use Topics   Alcohol use: Yes    Alcohol/week: 7.0 standard drinks of alcohol    Types: 7 Shots of liquor per week  Comment: 1-2 shots of vodka daily   Drug use: No    Review of Systems Per HPI unless specifically indicated above     Objective:    BP (!) 144/82   Pulse (!) 51   Ht 5\' 6"  (1.676 m)   Wt 165 lb (74.8 kg)   SpO2 98%   BMI 26.63 kg/m   Wt Readings from Last 3 Encounters:  04/02/23 165 lb (74.8 kg)  03/23/23 166 lb (75.3 kg)  03/11/23 159 lb 13.3 oz (72.5 kg)    Physical Exam Vitals and nursing note reviewed.  Constitutional:      General: He is not in acute distress.    Appearance: Normal appearance. He is well-developed. He is not diaphoretic.     Comments: Well-appearing, comfortable, cooperative  HENT:     Head: Normocephalic and atraumatic.  Eyes:     General:        Right eye: No discharge.        Left eye: No discharge.     Conjunctiva/sclera: Conjunctivae normal.  Cardiovascular:     Rate and Rhythm: Normal rate.  Pulmonary:     Effort: Pulmonary effort is normal.  Musculoskeletal:     Comments: Left low back with lumbar  paraspinal / back muscle hypertonicity. Has some reduced ROM left rotation and difficulty with pain on transition sit to stand. No radicular pain with straight leg raise.  Skin:    General: Skin is warm and dry.     Findings: No erythema or rash.  Neurological:     Mental Status: He is alert and oriented to person, place, and time.  Psychiatric:        Mood and Affect: Mood normal.        Behavior: Behavior normal.        Thought Content: Thought content normal.     Comments: Well groomed, good eye contact, normal speech and thoughts     Results for orders placed or performed in visit on 03/23/23  Microscopic Examination   Collection Time: 03/23/23  1:02 PM   Urine  Result Value Ref Range   WBC, UA 0-5 0 - 5 /hpf   RBC, Urine 0-2 0 - 2 /hpf   Epithelial Cells (non renal) 0-10 0 - 10 /hpf   Bacteria, UA None seen None seen/Few  Urinalysis, Complete   Collection Time: 03/23/23  1:02 PM  Result Value Ref Range   Specific Gravity, UA 1.010 1.005 - 1.030   pH, UA 5.5 5.0 - 7.5   Color, UA Yellow Yellow   Appearance Ur Clear Clear   Leukocytes,UA Negative Negative   Protein,UA Negative Negative/Trace   Glucose, UA 3+ (A) Negative   Ketones, UA Negative Negative   RBC, UA Trace (A) Negative   Bilirubin, UA Negative Negative   Urobilinogen, Ur 0.2 0.2 - 1.0 mg/dL   Nitrite, UA Negative Negative   Microscopic Examination See below:       Assessment & Plan:   Problem List Items Addressed This Visit   None Visit Diagnoses       Acute left-sided low back pain without sciatica    -  Primary   Relevant Medications   baclofen (LIORESAL) 10 MG tablet   oxyCODONE (OXY IR/ROXICODONE) 5 MG immediate release tablet     Muscle spasm of back       Relevant Medications   baclofen (LIORESAL) 10 MG tablet         Lower Back Pain Chronic  pain and stiffness, exacerbated by movement and rotation.   No current use of anti-inflammatories due to concurrent heart medication w/  anti-platelet medication.   -Prescribe muscle relaxant (Baclofen) up to three times daily, with caution regarding potential drowsiness. -Advise use of over-the-counter Tylenol for additional pain relief, as compatible with heart medication. -Prescribe Oxycodone (20 pills) for use in severe pain episodes, with caution regarding potential dependency. He has tolerated this before, has some pills remaining from old rx. Agree to order short term opioid due to limited rx for NSAID due to contraindication   General Health Maintenance / Followup Plans -Next appointment scheduled for February 2024, with planned blood work in the afternoon. -Address potential order for therapeutic shoes at next appointment.         No orders of the defined types were placed in this encounter.   Meds ordered this encounter  Medications   baclofen (LIORESAL) 10 MG tablet    Sig: Take 0.5-1 tablets (5-10 mg total) by mouth 3 (three) times daily as needed for muscle spasms.    Dispense:  60 each    Refill:  1   oxyCODONE (OXY IR/ROXICODONE) 5 MG immediate release tablet    Sig: Take 1 tablet (5 mg total) by mouth every 6 (six) hours as needed for severe pain (pain score 7-10).    Dispense:  20 tablet    Refill:  0    Follow up plan: Return if symptoms worsen or fail to improve.   Saralyn Pilar, DO [facility] 04/02/2023, 11:38 AM

## 2023-04-02 NOTE — Patient Instructions (Addendum)
Thank you for coming to the office today.  Likely muscle spasms in back  Start taking Baclofen (Lioresal) 10mg  (muscle relaxant) - start with half (cut) to one whole pill at night as needed for next 1-3 nights (may make you drowsy, caution with driving) see how it affects you, then if tolerated increase to one pill 2 to 3 times a day or (every 8 hours as needed)  Recommend to start taking Tylenol Extra Strength 500mg  tabs - take 1 to 2 tabs per dose (max 1000mg ) every 6-8 hours for pain (take regularly, don't skip a dose for next 7 days), max 24 hour daily dose is 6 tablets or 3000mg . In the future you can repeat the same everyday Tylenol course for 1-2 weeks at a time.   If severe pain, okay to use Oxycodone IR 5mg  as needed, use with caution.  Please schedule a Follow-up Appointment to: Return if symptoms worsen or fail to improve.  If you have any other questions or concerns, please feel free to call the office or send a message through MyChart. You may also schedule an earlier appointment if necessary.  Additionally, you may be receiving a survey about your experience at our office within a few days to 1 week by e-mail or mail. We value your feedback.  Saralyn Pilar, DO [facility], [place]

## 2023-04-03 ENCOUNTER — Encounter: Payer: Self-pay | Admitting: *Deleted

## 2023-04-03 ENCOUNTER — Telehealth: Payer: Self-pay | Admitting: Urology

## 2023-04-03 NOTE — Telephone Encounter (Signed)
Notified patient as instructed, patient pleased. He understand .

## 2023-04-03 NOTE — Telephone Encounter (Signed)
Urine cytology showed no cells suspicious for bladder cancer

## 2023-04-06 DIAGNOSIS — R262 Difficulty in walking, not elsewhere classified: Secondary | ICD-10-CM | POA: Diagnosis not present

## 2023-04-06 DIAGNOSIS — M25572 Pain in left ankle and joints of left foot: Secondary | ICD-10-CM | POA: Diagnosis not present

## 2023-04-07 ENCOUNTER — Other Ambulatory Visit: Payer: Self-pay | Admitting: Family Medicine

## 2023-04-07 DIAGNOSIS — J209 Acute bronchitis, unspecified: Secondary | ICD-10-CM

## 2023-04-08 NOTE — Telephone Encounter (Signed)
 Requested Prescriptions  Pending Prescriptions Disp Refills   albuterol  (VENTOLIN  HFA) 108 (90 Base) MCG/ACT inhaler [Pharmacy Med Name: ALBUTEROL  HFA INH (200 PUFFS) 6.7GM] 6.7 g 0    Sig: INHALE 2 PUFFS INTO THE LUNGS EVERY 6 HOURS AS NEEDED FOR WHEEZING OR SHORTNESS OF BREATH     Pulmonology:  Beta Agonists 2 Failed - 04/08/2023  3:13 PM      Failed - Last BP in normal range    BP Readings from Last 1 Encounters:  04/02/23 (!) 144/82         Passed - Last Heart Rate in normal range    Pulse Readings from Last 1 Encounters:  04/02/23 (!) 51         Passed - Valid encounter within last 12 months    Recent Outpatient Visits           6 days ago Acute left-sided low back pain without sciatica   [place] [facility] [place], Marsa PARAS, DO   1 month ago Gross hematuria   [place] [facility] [place], Marsa PARAS, DO   3 months ago Coronary artery disease of native artery of native heart with stable angina pectoris [facility])   [place] [facility] [place], Marsa PARAS, DO   5 months ago Type 2 diabetes mellitus with diabetic cataract, without long-term current use of insulin  [facility])   [facility] [place] Marsa PARAS, DO   9 months ago Colicky LLQ abdominal pain   [place] [facility] [place], Marsa PARAS, DO       Future Appointments             In 2 weeks [place], Marsa PARAS, DO [place] [facility]   In 3 weeks Perla, Evalene PARAS, MD [facility] [place]   In 5 months Stoioff, Glendia BROCKS, MD [facility] [place]

## 2023-04-15 DIAGNOSIS — E039 Hypothyroidism, unspecified: Secondary | ICD-10-CM | POA: Diagnosis not present

## 2023-04-15 DIAGNOSIS — E1169 Type 2 diabetes mellitus with other specified complication: Secondary | ICD-10-CM | POA: Diagnosis not present

## 2023-04-15 DIAGNOSIS — E785 Hyperlipidemia, unspecified: Secondary | ICD-10-CM | POA: Diagnosis not present

## 2023-04-15 DIAGNOSIS — I152 Hypertension secondary to endocrine disorders: Secondary | ICD-10-CM | POA: Diagnosis not present

## 2023-04-15 DIAGNOSIS — E559 Vitamin D deficiency, unspecified: Secondary | ICD-10-CM | POA: Diagnosis not present

## 2023-04-15 DIAGNOSIS — E1159 Type 2 diabetes mellitus with other circulatory complications: Secondary | ICD-10-CM | POA: Diagnosis not present

## 2023-04-15 DIAGNOSIS — E119 Type 2 diabetes mellitus without complications: Secondary | ICD-10-CM | POA: Diagnosis not present

## 2023-04-20 DIAGNOSIS — M25572 Pain in left ankle and joints of left foot: Secondary | ICD-10-CM | POA: Diagnosis not present

## 2023-04-20 DIAGNOSIS — R262 Difficulty in walking, not elsewhere classified: Secondary | ICD-10-CM | POA: Diagnosis not present

## 2023-04-27 ENCOUNTER — Encounter: Payer: Self-pay | Admitting: Family Medicine

## 2023-04-27 ENCOUNTER — Ambulatory Visit (INDEPENDENT_AMBULATORY_CARE_PROVIDER_SITE_OTHER): Payer: 59 | Admitting: Family Medicine

## 2023-04-27 VITALS — BP 130/78 | HR 66 | Ht 66.0 in | Wt 162.0 lb

## 2023-04-27 DIAGNOSIS — M150 Primary generalized (osteo)arthritis: Secondary | ICD-10-CM

## 2023-04-27 DIAGNOSIS — R339 Retention of urine, unspecified: Secondary | ICD-10-CM | POA: Diagnosis not present

## 2023-04-27 DIAGNOSIS — E1136 Type 2 diabetes mellitus with diabetic cataract: Secondary | ICD-10-CM

## 2023-04-27 DIAGNOSIS — G248 Other dystonia: Secondary | ICD-10-CM | POA: Diagnosis not present

## 2023-04-27 DIAGNOSIS — I10 Essential (primary) hypertension: Secondary | ICD-10-CM | POA: Diagnosis not present

## 2023-04-27 DIAGNOSIS — Z7984 Long term (current) use of oral hypoglycemic drugs: Secondary | ICD-10-CM | POA: Diagnosis not present

## 2023-04-27 DIAGNOSIS — E1169 Type 2 diabetes mellitus with other specified complication: Secondary | ICD-10-CM

## 2023-04-27 DIAGNOSIS — I25118 Atherosclerotic heart disease of native coronary artery with other forms of angina pectoris: Secondary | ICD-10-CM

## 2023-04-27 DIAGNOSIS — N400 Enlarged prostate without lower urinary tract symptoms: Secondary | ICD-10-CM

## 2023-04-27 DIAGNOSIS — E785 Hyperlipidemia, unspecified: Secondary | ICD-10-CM | POA: Diagnosis not present

## 2023-04-27 DIAGNOSIS — Z0000 Encounter for general adult medical examination without abnormal findings: Secondary | ICD-10-CM

## 2023-04-27 MED ORDER — GABAPENTIN 600 MG PO TABS: 600.0000 mg | ORAL_TABLET | Freq: Three times a day (TID) | ORAL | 3 refills | Status: AC

## 2023-04-27 NOTE — Progress Notes (Signed)
 Subjective:    Patient ID: Ross Riley, male    DOB: December 26, 1950, 73 y.o.   MRN: [id number]  Ross Riley is a 73 y.o. male presenting on 04/27/2023 for Diabetes   HPI  Discussed the use of AI scribe software for clinical note transcription with the patient, who gave verbal consent to proceed.  History of Present Illness   Ross Riley is a 73 year old male who presents for a general checkup and yearly blood work.  He is here for his annual physical examination and routine blood work. He has agreed to have the blood work done today, despite having eaten earlier, as he takes his cholesterol medication daily and is not concerned about the timing affecting the results.  He recently had a visit with an endocrinologist two weeks ago to manage his blood sugar levels.   He is currently taking a Gabapentin regimen of 300 mg tablets, with a dosing schedule of one tablet in the morning and two tablets later in the day and 2 at bedtime. Last order was only for 180 pills needs higher pill count.  No new changes or symptoms in his feet, such as numbness, tingling, or weakness, and his footwear is comfortable and appropriate. Needs Diabetic shoes ordered  He has an upcoming eye examination scheduled for March and will provide a copy of the report once completed.      Diabetes, Type 2 Followed by [facility] A1c 7.0 Meds: Jardiance 10mg  daily Reports good compliance. Tolerating well w/o side-effects Currently on ACEi, ASA, Statin Lifestyle: - Diet (balanced diet) - Exercise (limited activity due to chronic LLE issues see note)       12/26/2022    2:03 PM 10/24/2022    2:25 PM 08/07/2022    2:31 PM  Depression screen PHQ 2/9  Decreased Interest 0 0 1  Down, Depressed, Hopeless 0 1 1  PHQ - 2 Score 0 1 2  Altered sleeping 0 0 1  Tired, decreased energy 0 1 1  Change in appetite 0 0 0  Feeling bad or failure about yourself  0 0 0  Trouble concentrating 0 0 0  Moving  slowly or fidgety/restless 0 0 0  Suicidal thoughts 0 3 0  PHQ-9 Score 0 5 4  Difficult doing work/chores Not difficult at all Somewhat difficult Not difficult at all       12/26/2022    2:03 PM 10/24/2022    2:26 PM 06/23/2022    4:34 PM 04/25/2022   10:24 AM  GAD 7 : Generalized Anxiety Score  Nervous, Anxious, on Edge 0 0 0 1  Control/stop worrying 0 0 0 1  Worry too much - different things 0 0 0 0  Trouble relaxing 0 1 0 1  Restless 0 1 0 0  Easily annoyed or irritable 0 0 0 0  Afraid - awful might happen 0 0 0 0  Total GAD 7 Score 0 2 0 3  Anxiety Difficulty Not difficult at all Not difficult at all  Not difficult at all     Past Medical History:  Diagnosis Date   Arthritis    BPH (benign prostatic hyperplasia)    Chronic kidney disease    kidney stones   Dystonia    ED (erectile dysfunction)    GERD (gastroesophageal reflux disease)    Glaucoma (increased eye pressure)    Hematuria    Hemorrhoids    Hyperlipidemia    Hypertension  Thyroid disease    Past Surgical History:  Procedure Laterality Date   CATARACT EXTRACTION Bilateral    CORONARY/GRAFT ACUTE MI REVASCULARIZATION N/A 12/20/2022   Procedure: Coronary/Graft Acute MI Revascularization;  Surgeon: Iran Ouch, MD;  Location: [facility] INV[facility]IVE [facility];  Service: Cardiovascular;  Laterality: N/A;   EXTRACORPOREAL SHOCK WAVE LITHOTRIPSY Right    JOINT REPLACEMENT Left 08/2016   left knee, [place]   KIDNEY STONE SURGERY  2009   LEFT HEART CATH AND CORONARY ANGIOGRAPHY N/A 12/20/2022   Procedure: LEFT HEART CATH AND CORONARY ANGIOGRAPHY;  Surgeon: Iran Ouch, MD;  Location: [facility] CV [facility];  Service: Cardiovascular;  Laterality: N/A;   PROSTATE SURGERY  2011   TRANSURETHRAL RESECTION OF PROSTATE N/A 12/06/2014   Procedure: TRANSURETHRAL RESECTION OF THE PROSTATE (TURP);  Surgeon: Hildred Laser, MD;  Location: [facility] OR[facility];  Service: Urology;  Laterality: N/A;   Social History   Socioeconomic  History   Marital status: Married    Spouse name: Not on file   Number of children: Not on file   Years of education: Not on file   Highest education level: 9th grade  Occupational History   Occupation: retired  Tobacco Use   Smoking status: Former    Current packs/day: 0.00    Average packs/day: 1 pack/day for 32.0 years (32.0 ttl pk-yrs)    Types: Cigarettes    Start date: 03/04/1959    Quit date: 03/04/1991    Years since quitting: 32.1   Smokeless tobacco: Former  Building services engineer status: Never Used  Substance and Sexual Activity   Alcohol use: Yes    Alcohol/week: 7.0 standard drinks of alcohol    Types: 7 Shots of liquor per week    Comment: 1-2 shots of vodka daily   Drug use: No   Sexual activity: Yes    Birth control/protection: None  Other Topics Concern   Not on file  Social History Narrative   Not on file   Social Drivers of Health   Financial Resource Strain: Low Risk  (08/07/2022)   Overall Financial Resource Strain (CARDIA)    Difficulty of Paying Living Expenses: Not very hard  Food Insecurity: No Food Insecurity (01/08/2023)   Hunger Vital Sign    Worried About Running Out of Food in the Last Year: Never true    Ran Out of Food in the Last Year: Never true  Transportation Needs: No Transportation Needs (01/08/2023)   PRAPARE - Administrator, Civil Service (Medical): No    Lack of Transportation (Non-Medical): No  Physical Activity: Sufficiently Active (08/07/2022)   Exercise Vital Sign    Days of Exercise per Week: 3 days    Minutes of Exercise per Session: 60 min  Stress: No Stress Concern Present (08/07/2022)   [employer] of Occupational Health - Occupational Stress Questionnaire    Feeling of Stress : Not at all  Social Connections: Socially Isolated (08/07/2022)   Social Connection and Isolation Panel [NHANES]    Frequency of Communication with Friends and Family: Never    Frequency of Social Gatherings with Friends and Family: Never     Attends Religious Services: Never    Database administrator or Organizations: No    Attends Banker Meetings: Never    Marital Status: Married  Catering manager Violence: Not At Risk (01/08/2023)   Humiliation, Afraid, Rape, and Kick questionnaire    Fear of Current or Ex-Partner: No  Emotionally Abused: No    Physically Abused: No    Sexually Abused: No   Family History  Problem Relation Age of Onset   Heart disease Mother    Hypertension Brother    Current Outpatient Medications on File Prior to Visit  Medication Sig   ACCU-CHEK AVIVA PLUS test strip Use to check blood sugar up to 1 x per day   Accu-Chek Softclix Lancets lancets Use to check blood sugar up to 1 x per day   atorvastatin (LIPITOR) 80 MG tablet Take 1 tablet (80 mg total) by mouth at bedtime.   Blood Glucose Monitoring Suppl (ACCU-CHEK AVIVA PLUS) w/Device KIT Use to check blood sugar up to 1 x per day   carvedilol (COREG) 3.125 MG tablet Take 1 tablet (3.125 mg total) by mouth 2 (two) times daily with a meal.   dorzolamide-timolol (COSOPT) 22.3-6.8 MG/ML ophthalmic solution Place 1 drop into both eyes 2 (two) times daily.   empagliflozin (JARDIANCE) 10 MG TABS tablet Take 1 tablet (10 mg total) by mouth daily.   finasteride (PROSCAR) 5 MG tablet Take 1 tablet (5 mg total) by mouth daily.   folic acid (FOLVITE) 1 MG tablet Take 1 tablet (1 mg total) by mouth daily.   isosorbide mononitrate (IMDUR) 30 MG 24 hr tablet TAKE 1/2 TABLET(15 MG) BY MOUTH DAILY   latanoprost (XALATAN) 0.005 % ophthalmic solution Place 1 drop into both eyes at bedtime.   levothyroxine (SYNTHROID) 75 MCG tablet Take 75 mcg by mouth daily before breakfast.   losartan (COZAAR) 25 MG tablet Take 1 tablet (25 mg total) by mouth daily.   Multiple Vitamin (MULTIVITAMIN WITH MINERALS) TABS tablet Take 1 tablet by mouth daily.   omeprazole (PRILOSEC) 40 MG capsule TAKE 1 CAPSULE(40 MG) BY MOUTH DAILY BEFORE BREAKFAST   ticagrelor  (BRILINTA) 90 MG TABS tablet Take 1 tablet (90 mg total) by mouth 2 (two) times daily.   albuterol (VENTOLIN HFA) 108 (90 Base) MCG/ACT inhaler INHALE 2 PUFFS INTO THE LUNGS EVERY 6 HOURS AS NEEDED FOR WHEEZING OR SHORTNESS OF BREATH (Patient not taking: Reported on 04/27/2023)   baclofen (LIORESAL) 10 MG tablet Take 0.5-1 tablets (5-10 mg total) by mouth 3 (three) times daily as needed for muscle spasms. (Patient not taking: Reported on 04/27/2023)   brompheniramine-pseudoephedrine-DM 30-2-10 MG/5ML syrup TAKE 5 ML BY MOUTH FOUR TIMES DAILY AS NEEDED (Patient not taking: Reported on 04/27/2023)   dicyclomine (BENTYL) 10 MG capsule Take 1 capsule (10 mg total) by mouth 4 (four) times daily -  before meals and at bedtime. As needed for abdominal pain cramping (Patient not taking: Reported on 04/27/2023)   nitroGLYCERIN (NITROSTAT) 0.4 MG SL tablet Place 1 tablet (0.4 mg total) under the tongue every 5 (five) minutes as needed for chest pain.   oxyCODONE (OXY IR/ROXICODONE) 5 MG immediate release tablet Take 1 tablet (5 mg total) by mouth every 6 (six) hours as needed for severe pain (pain score 7-10). (Patient not taking: Reported on 04/27/2023)   No current facility-administered medications on file prior to visit.    Review of Systems Per HPI unless specifically indicated above     Objective:    BP 130/78   Pulse 66   Ht 5\' 6"  (1.676 m)   Wt 162 lb (73.5 kg)   BMI 26.15 kg/m   Wt Readings from Last 3 Encounters:  04/27/23 162 lb (73.5 kg)  04/02/23 165 lb (74.8 kg)  03/23/23 166 lb (75.3 kg)    Physical  Exam Vitals and nursing note reviewed.  Constitutional:      General: He is not in acute distress.    Appearance: He is well-developed. He is not diaphoretic.     Comments: Well-appearing, comfortable, cooperative  HENT:     Head: Normocephalic and atraumatic.  Eyes:     General:        Right eye: No discharge.        Left eye: No discharge.     Conjunctiva/sclera: Conjunctivae  normal.     Pupils: Pupils are equal, round, and reactive to light.  Neck:     Thyroid: No thyromegaly.  Cardiovascular:     Rate and Rhythm: Normal rate and regular rhythm.     Pulses: Normal pulses.     Heart sounds: Normal heart sounds. No murmur heard. Pulmonary:     Effort: Pulmonary effort is normal. No respiratory distress.     Breath sounds: Normal breath sounds. No wheezing or rales.  Abdominal:     General: Bowel sounds are normal. There is no distension.     Palpations: Abdomen is soft. There is no mass.     Tenderness: There is no abdominal tenderness.  Musculoskeletal:        General: No tenderness. Normal range of motion.     Cervical back: Normal range of motion and neck supple.     Comments: Upper / Lower Extremities: - Normal muscle tone, strength bilateral upper extremities 5/5, lower extremities 5/5  Lymphadenopathy:     Cervical: No cervical adenopathy.  Skin:    General: Skin is warm and dry.     Findings: No erythema or rash.  Neurological:     Mental Status: He is alert and oriented to person, place, and time.     Comments: Distal sensation intact to light touch all extremities  Psychiatric:        Mood and Affect: Mood normal.        Behavior: Behavior normal.        Thought Content: Thought content normal.     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 No ulceration. He has callus formation heels and forefoot. Intact sensation to monofilament.       Results for orders placed or performed in visit on 03/23/23  Microscopic Examination   Collection  Time: 03/23/23  1:02 PM   Urine  Result Value Ref Range   WBC, UA 0-5 0 - 5 /hpf   RBC, Urine 0-2 0 - 2 /hpf   Epithelial Cells (non renal) 0-10 0 - 10 /hpf   Bacteria, UA None seen None seen/Few  Urinalysis, Complete   Collection Time: 03/23/23  1:02 PM  Result Value Ref Range   Specific Gravity, UA 1.010 1.005 - 1.030   pH, UA 5.5 5.0 - 7.5   Color, UA Yellow Yellow   Appearance Ur Clear Clear   Leukocytes,UA Negative Negative   Protein,UA Negative Negative/Trace   Glucose, UA 3+ (A) Negative   Ketones, UA Negative Negative   RBC, UA Trace (A) Negative   Bilirubin, UA Negative Negative   Urobilinogen, Ur 0.2 0.2 -  1.0 mg/dL   Nitrite, UA Negative Negative   Microscopic Examination See below:       Assessment & Plan:   Problem List Items Addressed This Visit     BPH (benign prostatic hyperplasia)   Essential hypertension   Relevant Orders   COMPLETE METABOLIC PANEL WITH GFR   CBC with Differential/Platelet   Hyperlipidemia associated with type 2 diabetes mellitus (HCC)   Relevant Orders   Lipid panel   COMPLETE METABOLIC PANEL WITH GFR   TSH   Osteoarthritis of multiple joints   Relevant Medications   gabapentin (NEURONTIN) 600 MG tablet   Type 2 diabetes mellitus with diabetic cataract, without long-term current use of insulin (HCC)   Relevant Orders   Hemoglobin A1c   COMPLETE METABOLIC PANEL WITH GFR   Microalbumin / creatinine urine ratio   Other Visit Diagnoses       Annual physical exam    -  Primary   Relevant Orders   Lipid panel   Hemoglobin A1c   COMPLETE METABOLIC PANEL WITH GFR   CBC with Differential/Platelet   PSA   TSH     Long term current use of oral hypoglycemic drug         Coronary artery disease of native artery of native heart with stable angina pectoris (HCC)       Relevant Medications   gabapentin (NEURONTIN) 600 MG tablet   Other Relevant Orders   Lipid panel   COMPLETE METABOLIC PANEL WITH GFR     Urinary retention        Relevant Orders   PSA        Updated Health Maintenance information Ordered non fasting labs today Encouraged improvement to lifestyle with diet and exercise Goal of weight loss  Diabetes Type 2 Followed by [facility] Patient is on Jardiance. No new changes or concerns reported. Recent visit with endocrinologist. -Continue current management. -Order full panel blood work and urine test today for urine microalbumin  Hyperlipidemia On Atorvastatin 80mg . Discussed potential impact of fasting status on cholesterol levels. -Continue current management. -Include lipid panel in today's blood work.  Gabapentin Dosing Patient reports taking 300mg  Gabapentin 1-2-2 daily for better control. Discussed options for simplifying regimen. -Change Gabapentin dose to 600mg  THREE TIMES A DAY and adjust pill count to 270 for a 90-day supply.  Diabetic Foot Care See below -Order diabetic shoes from [employer].  Ophthalmology Follow-up Patient has an eye exam scheduled for next month. -Request copy of report after eye exam.  General Health Maintenance -Blood pressure is well-controlled at 130/78. -Continue current management. -Check blood work today.      Stable, chronic Left foot deformity - chronic problem. It is significantly affecting his gait and limiting his ambulation which appears to have affected degenerative wear on his Left knee, now s/p TKR L knee - Followed by Neurology / Gaylord Shih - he has diabetic shoes and orthotic inserts / AFO S/p L Ankle surgery   Will order new DM shoes   Chronic L foot deformity and dystonia Callus formation on heels. No ulcer   Completed DM Foot Exam today in office, 04/27/23. See exam note.   Plan - Proceed with ordering Diabetic Shoes, completed rx pad and order form, given to patient to arrange w/ [facility] - Patient would benefit from Diabetic Shoes due to foot deformity and callus formation diabetes control is improving on current regimen, and I  am continuing to monitor and manage diabetes.    Orders Placed This  Encounter  Procedures   Lipid panel    Has the patient fasted?:   Yes   Hemoglobin A1c   COMPLETE METABOLIC PANEL WITH GFR   CBC with Differential/Platelet   PSA   Microalbumin / creatinine urine ratio   TSH    Meds ordered this encounter  Medications   gabapentin (NEURONTIN) 600 MG tablet    Sig: Take 1 tablet (600 mg total) by mouth 3 (three) times daily.    Dispense:  270 tablet    Refill:  3    Change dose to 600 and adjusted pill count.     Follow up plan: Return for 6 month DM A1c.  Saralyn Pilar, DO [facility] 04/27/2023, 1:45 PM

## 2023-04-27 NOTE — Patient Instructions (Addendum)
 Thank you for coming to the office today.  Diabetic Shoe order for [facility] today  Please schedule a Follow-up Appointment to: Return for 6 month DM A1c.  If you have any other questions or concerns, please feel free to call the office or send a message through MyChart. You may also schedule an earlier appointment if necessary.  Additionally, you may be receiving a survey about your experience at our office within a few days to 1 week by e-mail or mail. We value your feedback.  Saralyn Pilar, DO [facility], [place]

## 2023-04-28 LAB — CBC WITH DIFFERENTIAL/PLATELET
Absolute Lymphocytes: 2266 {cells}/uL (ref 850–3900)
Absolute Monocytes: 466 {cells}/uL (ref 200–950)
Basophils Absolute: 19 {cells}/uL (ref 0–200)
Basophils Relative: 0.4 %
Eosinophils Absolute: 120 {cells}/uL (ref 15–500)
Eosinophils Relative: 2.5 %
HCT: 48 % (ref 38.5–50.0)
Hemoglobin: 15.6 g/dL (ref 13.2–17.1)
MCH: 29.5 pg (ref 27.0–33.0)
MCHC: 32.5 g/dL (ref 32.0–36.0)
MCV: 90.7 fL (ref 80.0–100.0)
MPV: 11.5 fL (ref 7.5–12.5)
Monocytes Relative: 9.7 %
Neutro Abs: 1930 {cells}/uL (ref 1500–7800)
Neutrophils Relative %: 40.2 %
Platelets: 230 10*3/uL (ref 140–400)
RBC: 5.29 10*6/uL (ref 4.20–5.80)
RDW: 12.2 % (ref 11.0–15.0)
Total Lymphocyte: 47.2 %
WBC: 4.8 10*3/uL (ref 3.8–10.8)

## 2023-04-28 LAB — LIPID PANEL
Cholesterol: 139 mg/dL (ref <200)
HDL: 45 mg/dL (ref >=40)
LDL Cholesterol (Calc): 69 mg/dL
Non-HDL Cholesterol (Calc): 94 mg/dL (ref <130)
Total CHOL/HDL Ratio: 3.1 (calc) (ref <5.0)
Triglycerides: 176 mg/dL — ABNORMAL HIGH (ref <150)

## 2023-04-28 LAB — MICROALBUMIN / CREATININE URINE RATIO
Creatinine, Urine: 73 mg/dL (ref 20–320)
Microalb Creat Ratio: 21 mg/g{creat} (ref <30)
Microalb, Ur: 1.5 mg/dL

## 2023-04-28 LAB — COMPLETE METABOLIC PANEL WITH GFR
AG Ratio: 1.6 (calc) (ref 1.0–2.5)
ALT: 22 U/L (ref 9–46)
AST: 21 U/L (ref 10–35)
Albumin: 4.5 g/dL (ref 3.6–5.1)
Alkaline phosphatase (APISO): 79 U/L (ref 35–144)
BUN: 11 mg/dL (ref 7–25)
CO2: 27 mmol/L (ref 20–32)
Calcium: 9.6 mg/dL (ref 8.6–10.3)
Chloride: 105 mmol/L (ref 98–110)
Creat: 0.87 mg/dL (ref 0.70–1.28)
Globulin: 2.9 g/dL (ref 1.9–3.7)
Glucose, Bld: 136 mg/dL — ABNORMAL HIGH (ref 65–99)
Potassium: 4.5 mmol/L (ref 3.5–5.3)
Sodium: 141 mmol/L (ref 135–146)
Total Bilirubin: 0.8 mg/dL (ref 0.2–1.2)
Total Protein: 7.4 g/dL (ref 6.1–8.1)
eGFR: 92 mL/min/{1.73_m2} (ref >=60)

## 2023-04-28 LAB — TSH: TSH: 2.01 m[IU]/L (ref 0.40–4.50)

## 2023-04-28 LAB — HEMOGLOBIN A1C
Hgb A1c MFr Bld: 7.6 %{Hb} — ABNORMAL HIGH (ref <5.7)
Mean Plasma Glucose: 171 mg/dL
eAG (mmol/L): 9.5 mmol/L

## 2023-04-28 LAB — PSA: PSA: 3.31 ng/mL (ref <=4.00)

## 2023-04-30 ENCOUNTER — Encounter: Payer: Self-pay | Admitting: Family Medicine

## 2023-05-02 ENCOUNTER — Other Ambulatory Visit: Payer: Self-pay | Admitting: Family Medicine

## 2023-05-02 DIAGNOSIS — E1169 Type 2 diabetes mellitus with other specified complication: Secondary | ICD-10-CM

## 2023-05-03 NOTE — Progress Notes (Unsigned)
 Shortness of cardiology Office Note  Date:  05/04/2023   ID:  Ross Riley, Ross Riley 10/09/1950, MRN [id number]  PCP:  Smitty Cords, DO   Chief Complaint  Patient presents with   3 month follow up     Patient was at [facility] ER with chest pain on 03/2023.Patient c/o shortness of breath at times.     HPI:  [name] is a 73 year old gentleman with past medical history of Diabetes type 2 Hypertension Hyperlipidemia Smoker, quit Alcohol abuse Stent placed to LAD October 2024 Chronic chest pain and shortness of breath Who presents for f/u of his chest pain  Last seen by myself in clinic September 2023 Seen by one of our providers November 2024 Seen today with assistance from Spanish translator, Marchelle Folks  Reports doing well from a cardiac perspective Reports having recent ankle surgery, residual ankle pain  Reports having no major issues with his medications though does report that he stopped aspirin, continue Brilinta Aspirin was stopped for hematuria Reports that he is followed by urology  EKG personally reviewed by myself on todays visit EKG Interpretation Date/Time:  Monday May 04 2023 13:48:29 EST Ventricular Rate:  58 PR Interval:  130 QRS Duration:  90 QT Interval:  452 QTC Calculation: 443 R Axis:   -61  Text Interpretation: Sinus bradycardia Left axis deviation T wave abnormality, consider anterolateral ischemia When compared with ECG of 11-Mar-2023 21:04, Nonspecific T wave abnormality now evident in Inferior leads T wave inversion now evident in Anterolateral leads Confirmed by Julien Nordmann ([id number]) on 05/04/2023 6:28:18 PM    Cardiac CTA September 2023 Coronary calcium score of 265. This was 61st percentile for age and sex matched control.  Normal coronary origin with left dominance. Moderate stenosis (50%) in the proximal LAD.  ED via EMS on 12/20/2022 with complaints of left-sided chest pain.  EKG demonstrated inferior T wave inversions.  Repeat EKG was consistent with STEMI. Troponin elevated to 700.   Cath Lab and underwent PCI with DES to proximal to mid LAD.  Chronic chest pain and shortness of breath since that time  Echo 12/20/2022 showed EF 35 to 40%   seen in the emergency room Jul 13, 2021 10 AM , he experienced fairly acute onset of aching, dull, substernal chest pain.  This pain has been fairly constant since onset.  He describes it as severe as up to 8 out of 10 but now about a 6 out of 10.  Is been constant.  Its remained in his left parasternal area, no radiation to the arms, or back.  Pain is not sharp or tearing.  No significant shortness of breath.  No nausea or vomiting.  Denies any specific alleviating or aggravating factors.  Reports he has a history of chest pain in the past  Cardiac enzymes negative, EKG nonacute Given GI cocktail in the ER Discharged on PPI daily with sucralfate 3 times daily  On further discussion today he reports having 3 episodes of chest pain Once this year, 2 episodes last year Typically lasting several hours at a time, left side of his chest, throbbing  He is sedentary, no regular exercise, " I do nothing"  Remote smoking Other risk factors include diabetes, hyperlipidemia   PMH:   has a past medical history of Arthritis, BPH (benign prostatic hyperplasia), Chronic kidney disease, Dystonia, ED (erectile dysfunction), GERD (gastroesophageal reflux disease), Glaucoma (increased eye pressure), Hematuria, Hemorrhoids, Hyperlipidemia, Hypertension, and Thyroid disease.  PSH:    Past Surgical History:  Procedure Laterality Date   CATARACT EXTRACTION Bilateral    CORONARY/GRAFT ACUTE MI REVASCULARIZATION N/A 12/20/2022   Procedure: Coronary/Graft Acute MI Revascularization;  Surgeon: Iran Ouch, MD;  Location: [facility] INVASIVE CV LAB;  Service: Cardiovascular;  Laterality: N/A;   EXTRACORPOREAL SHOCK WAVE LITHOTRIPSY Right    JOINT REPLACEMENT Left 08/2016   left knee, [place]    KIDNEY STONE SURGERY  2009   LEFT HEART CATH AND CORONARY ANGIOGRAPHY N/A 12/20/2022   Procedure: LEFT HEART CATH AND CORONARY ANGIOGRAPHY;  Surgeon: Iran Ouch, MD;  Location: [facility] INVASIVE CV LAB;  Service: Cardiovascular;  Laterality: N/A;   PROSTATE SURGERY  2011   TRANSURETHRAL RESECTION OF PROSTATE N/A 12/06/2014   Procedure: TRANSURETHRAL RESECTION OF THE PROSTATE (TURP);  Surgeon: Hildred Laser, MD;  Location: [facility] ORS;  Service: Urology;  Laterality: N/A;    Current Outpatient Medications  Medication Sig Dispense Refill   atorvastatin (LIPITOR) 80 MG tablet Take 1 tablet (80 mg total) by mouth at bedtime. 90 tablet 3   carvedilol (COREG) 3.125 MG tablet Take 1 tablet (3.125 mg total) by mouth 2 (two) times daily with a meal. 60 tablet 2   dorzolamide-timolol (COSOPT) 22.3-6.8 MG/ML ophthalmic solution Place 1 drop into both eyes 2 (two) times daily.     empagliflozin (JARDIANCE) 10 MG TABS tablet Take 1 tablet (10 mg total) by mouth daily. 30 tablet 2   finasteride (PROSCAR) 5 MG tablet Take 1 tablet (5 mg total) by mouth daily. 90 tablet 3   folic acid (FOLVITE) 1 MG tablet Take 1 tablet (1 mg total) by mouth daily. 30 tablet 2   gabapentin (NEURONTIN) 600 MG tablet Take 1 tablet (600 mg total) by mouth 3 (three) times daily. 270 tablet 3   isosorbide mononitrate (IMDUR) 30 MG 24 hr tablet TAKE 1/2 TABLET(15 MG) BY MOUTH DAILY 45 tablet 0   latanoprost (XALATAN) 0.005 % ophthalmic solution Place 1 drop into both eyes at bedtime.  6   levothyroxine (SYNTHROID) 75 MCG tablet Take 75 mcg by mouth daily before breakfast.     losartan (COZAAR) 25 MG tablet Take 1 tablet (25 mg total) by mouth daily. 30 tablet 2   Multiple Vitamin (MULTIVITAMIN WITH MINERALS) TABS tablet Take 1 tablet by mouth daily. 30 tablet 2   nitroGLYCERIN (NITROSTAT) 0.4 MG SL tablet Place 1 tablet (0.4 mg total) under the tongue every 5 (five) minutes as needed for chest pain. 25 tablet 2   omeprazole  (PRILOSEC) 40 MG capsule TAKE 1 CAPSULE(40 MG) BY MOUTH DAILY BEFORE BREAKFAST 90 capsule 1   ticagrelor (BRILINTA) 90 MG TABS tablet Take 1 tablet (90 mg total) by mouth 2 (two) times daily. 60 tablet 2   ACCU-CHEK AVIVA PLUS test strip Use to check blood sugar up to 1 x per day (Patient not taking: Reported on 05/04/2023) 100 each 12   Accu-Chek Softclix Lancets lancets Use to check blood sugar up to 1 x per day (Patient not taking: Reported on 05/04/2023) 100 each 12   Blood Glucose Monitoring Suppl (ACCU-CHEK AVIVA PLUS) w/Device KIT Use to check blood sugar up to 1 x per day (Patient not taking: Reported on 05/04/2023) 1 kit 0   dicyclomine (BENTYL) 10 MG capsule Take 1 capsule (10 mg total) by mouth 4 (four) times daily -  before meals and at bedtime. As needed for abdominal pain cramping (Patient not taking: Reported on 05/04/2023) 30 capsule 2   No current facility-administered medications for this visit.  Allergies:   Metformin and related and Metformin hcl   Social History:  The patient  reports that he quit smoking about 32 years ago. His smoking use included cigarettes. He started smoking about 64 years ago. He has a 32 pack-year smoking history. He has quit using smokeless tobacco. He reports current alcohol use of about 7.0 standard drinks of alcohol per week. He reports that he does not use drugs.   Family History:   family history includes Heart disease in his mother; Hypertension in his brother.    Review of Systems: Review of Systems  Constitutional: Negative.   HENT: Negative.    Respiratory: Negative.    Cardiovascular:  Positive for chest pain.  Gastrointestinal: Negative.   Musculoskeletal: Negative.   Neurological: Negative.   Psychiatric/Behavioral: Negative.    All other systems reviewed and are negative.   PHYSICAL EXAM: VS:  BP 126/70 (BP Location: Left Arm, Patient Position: Sitting, Cuff Size: Normal)   Pulse (!) 58   Ht 5\' 6"  (1.676 m)   Wt 166 lb (75.3 kg)    SpO2 96%   BMI 26.79 kg/m  , BMI Body mass index is 26.79 kg/m. Constitutional:  oriented to person, place, and time. No distress.  HENT:  Head: Grossly normal Eyes:  no discharge. No scleral icterus.  Neck: No JVD, no carotid bruits  Cardiovascular: Regular rate and rhythm, no murmurs appreciated Pulmonary/Chest: Clear to auscultation bilaterally, no wheezes or rails Abdominal: Soft.  no distension.  no tenderness.  Musculoskeletal: Normal range of motion Neurological:  normal muscle tone. Coordination normal. No atrophy Skin: Skin warm and dry Psychiatric: normal affect, pleasant  Recent Labs: 12/21/2022: Magnesium 2.0 04/27/2023: ALT 22; BUN 11; Creat 0.87; Hemoglobin 15.6; Platelets 230; Potassium 4.5; Sodium 141; TSH 2.01   Lipid Panel Lab Results  Component Value Date   CHOL 139 04/27/2023   HDL 45 04/27/2023   LDLCALC 69 04/27/2023   TRIG 176 (H) 04/27/2023      Wt Readings from Last 3 Encounters:  05/04/23 166 lb (75.3 kg)  04/27/23 162 lb (73.5 kg)  04/02/23 165 lb (74.8 kg)     ASSESSMENT AND PLAN:  Problem List Items Addressed This Visit       Cardiology Problems   Ischemic cardiomyopathy   Relevant Orders   EKG 12-Lead (Completed)     Other   Type 2 diabetes mellitus with diabetic cataract, without long-term current use of insulin (HCC)   Other Visit Diagnoses       Coronary artery disease of native artery of native heart with stable angina pectoris (HCC)    -  Primary   Relevant Orders   EKG 12-Lead (Completed)     HFrEF (heart failure with reduced ejection fraction) (HCC)       Relevant Orders   EKG 12-Lead (Completed)     Primary hypertension       Relevant Orders   EKG 12-Lead (Completed)     Hyperlipidemia LDL goal <70           Coronary artery disease with stable angina Stent placed to LAD October 2024 Stopped aspirin on his own in the setting of hematuria, has continued on Brilinta Denies anginal symptoms Recommend he continue  Lipitor 80 daily  Diabetes type 2 A1c trending higher now up to 7.6 Diet restriction recommended Managed by primary care  Hyperlipidemia Recommend he continue Lipitor 80 daily, cholesterol close to goal  Essential hypertension Blood pressure is well controlled on today's visit.  No changes made to the medications.    Signed, Dossie Arbour, M.D., Ph.D. [facility] [employer], [place] [phone]

## 2023-05-04 ENCOUNTER — Encounter: Payer: Self-pay | Admitting: Cardiovascular Disease

## 2023-05-04 ENCOUNTER — Ambulatory Visit: Payer: 59 | Attending: Cardiovascular Disease | Admitting: Cardiovascular Disease

## 2023-05-04 VITALS — BP 126/70 | HR 58 | Ht 66.0 in | Wt 166.0 lb

## 2023-05-04 DIAGNOSIS — E785 Hyperlipidemia, unspecified: Secondary | ICD-10-CM

## 2023-05-04 DIAGNOSIS — I5020 Unspecified systolic (congestive) heart failure: Secondary | ICD-10-CM

## 2023-05-04 DIAGNOSIS — E1136 Type 2 diabetes mellitus with diabetic cataract: Secondary | ICD-10-CM

## 2023-05-04 DIAGNOSIS — I255 Ischemic cardiomyopathy: Secondary | ICD-10-CM

## 2023-05-04 DIAGNOSIS — I10 Essential (primary) hypertension: Secondary | ICD-10-CM | POA: Diagnosis not present

## 2023-05-04 DIAGNOSIS — I25118 Atherosclerotic heart disease of native coronary artery with other forms of angina pectoris: Secondary | ICD-10-CM | POA: Diagnosis not present

## 2023-05-04 NOTE — Patient Instructions (Addendum)
 Medication Instructions:  No changes  If you need a refill on your cardiac medications before your next appointment, please call your pharmacy.   Lab work: No new labs needed  Testing/Procedures: Your physician has requested that you have an Limited echocardiogram. Echocardiography is a painless test that uses sound waves to create images of your heart. It provides your doctor with information about the size and shape of your heart and how well your heart's chambers and valves are working.   You may receive an ultrasound enhancing agent through an IV if needed to better visualize your heart during the echo. This procedure takes approximately one hour.  There are no restrictions for this procedure.  This will take place at [address] [facility] [address], [place] [address]  Please note: We ask at that you not bring children with you during ultrasound (echo/ vascular) testing. Due to room size and safety concerns, children are not allowed in the ultrasound rooms during exams. Our front office staff cannot provide observation of children in our lobby area while testing is being conducted. An adult accompanying a patient to their appointment will only be allowed in the ultrasound room at the discretion of the ultrasound technician under special circumstances. We apologize for any inconvenience.   Follow-Up: At [facility], you and your health needs are our priority.  As part of our continuing mission to provide you with exceptional heart care, we have created designated Provider Care Teams.  These Care Teams include your primary Cardiologist (physician) and Advanced Practice Providers (APPs -  Physician Assistants and Nurse Practitioners) who all work together to provide you with the care you need, when you need it.  You will need a follow up appointment in 12 months  Providers on your designated Care Team:   Nicolasa Ducking, NP Eula Listen, PA-C Cadence Fransico Michael,  [place]  COVID-19 Vaccine Information can be found at: [email or web address] For questions related to vaccine distribution or appointments, please email [email or web address] or call [phone].

## 2023-05-04 NOTE — Telephone Encounter (Signed)
 Requested Prescriptions  Refused Prescriptions Disp Refills   atorvastatin (LIPITOR) 20 MG tablet [Pharmacy Med Name: ATORVASTATIN 20MG  TABLETS] 90 tablet 3    Sig: TAKE 1 TABLET(20 MG) BY MOUTH AT BEDTIME     Cardiovascular:  Antilipid - Statins Failed - 05/04/2023  2:58 PM      Failed - Lipid Panel in normal range within the last 12 months    Cholesterol, Total  Date Value Ref Range Status  06/19/2015 195 100 - 199 mg/dL Final   Cholesterol  Date Value Ref Range Status  04/27/2023 139 <200 mg/dL Final   LDL Cholesterol (Calc)  Date Value Ref Range Status  04/27/2023 69 mg/dL (calc) Final    Comment:    Reference range: <100 . Desirable range <100 mg/dL for primary prevention;   <70 mg/dL for patients with CHD or diabetic patients  with > or = 2 CHD risk factors. Marland Kitchen LDL-C is now calculated using the Martin-Hopkins  calculation, which is a validated novel method providing  better accuracy than the Friedewald equation in the  estimation of LDL-C.  Horald Pollen et al. Lenox Ahr. 1610;960(45): 2061-2068  (http://education.QuestDiagnostics.com/faq/FAQ164)    HDL  Date Value Ref Range Status  04/27/2023 45 > OR = 40 mg/dL Final  40/98/1191 52 >47 mg/dL Final   Triglycerides  Date Value Ref Range Status  04/27/2023 176 (H) <150 mg/dL Final         Passed - Patient is not pregnant      Passed - Valid encounter within last 12 months    Recent Outpatient Visits           1 month ago Acute left-sided low back pain without sciatica   [place] [facility] [place], Netta Neat, DO   2 months ago Gross hematuria   [facility] [place], Netta Neat, DO   4 months ago Coronary artery disease of native artery of native heart with stable angina pectoris [facility])   [place] [facility] [place], Netta Neat, DO   6 months ago Type 2 diabetes mellitus with diabetic cataract, without long-term current use of insulin  [facility])   [place] [facility] Smitty Cords, DO   10 months ago [facility] abdominal pain   [place] [facility] Smitty Cords, DO       Future Appointments             In 4 months Stoioff, Verna Czech, MD [facility] [place]   In 5 months Althea Charon, Netta Neat, DO [place] [facility]

## 2023-05-05 DIAGNOSIS — H35033 Hypertensive retinopathy, bilateral: Secondary | ICD-10-CM | POA: Diagnosis not present

## 2023-05-11 DIAGNOSIS — M24572 Contracture, left ankle: Secondary | ICD-10-CM | POA: Diagnosis not present

## 2023-05-11 DIAGNOSIS — M25872 Other specified joint disorders, left ankle and foot: Secondary | ICD-10-CM | POA: Diagnosis not present

## 2023-05-11 DIAGNOSIS — Z981 Arthrodesis status: Secondary | ICD-10-CM | POA: Diagnosis not present

## 2023-05-11 DIAGNOSIS — Z96662 Presence of left artificial ankle joint: Secondary | ICD-10-CM | POA: Diagnosis not present

## 2023-05-11 DIAGNOSIS — M216X2 Other acquired deformities of left foot: Secondary | ICD-10-CM | POA: Diagnosis not present

## 2023-05-11 DIAGNOSIS — M792 Neuralgia and neuritis, unspecified: Secondary | ICD-10-CM | POA: Diagnosis not present

## 2023-05-11 DIAGNOSIS — M6788 Other specified disorders of synovium and tendon, other site: Secondary | ICD-10-CM | POA: Diagnosis not present

## 2023-05-12 ENCOUNTER — Other Ambulatory Visit: Payer: Self-pay | Admitting: Orthopedic Surgery

## 2023-05-12 DIAGNOSIS — M216X2 Other acquired deformities of left foot: Secondary | ICD-10-CM

## 2023-05-12 DIAGNOSIS — Z96662 Presence of left artificial ankle joint: Secondary | ICD-10-CM

## 2023-05-12 DIAGNOSIS — M792 Neuralgia and neuritis, unspecified: Secondary | ICD-10-CM

## 2023-05-12 DIAGNOSIS — M6788 Other specified disorders of synovium and tendon, other site: Secondary | ICD-10-CM

## 2023-05-12 DIAGNOSIS — M24572 Contracture, left ankle: Secondary | ICD-10-CM

## 2023-05-13 DIAGNOSIS — E1136 Type 2 diabetes mellitus with diabetic cataract: Secondary | ICD-10-CM | POA: Diagnosis not present

## 2023-05-15 IMAGING — DX DG HIP (WITH OR WITHOUT PELVIS) 2-3V*L*
3 series · 3 of 3 positions shown · non-contrast
Comparison: None.

CLINICAL DATA: Chronic left hip pain, no injury

EXAM:
DG HIP (WITH OR WITHOUT PELVIS) 2-3V LEFT

[pelvis ap]
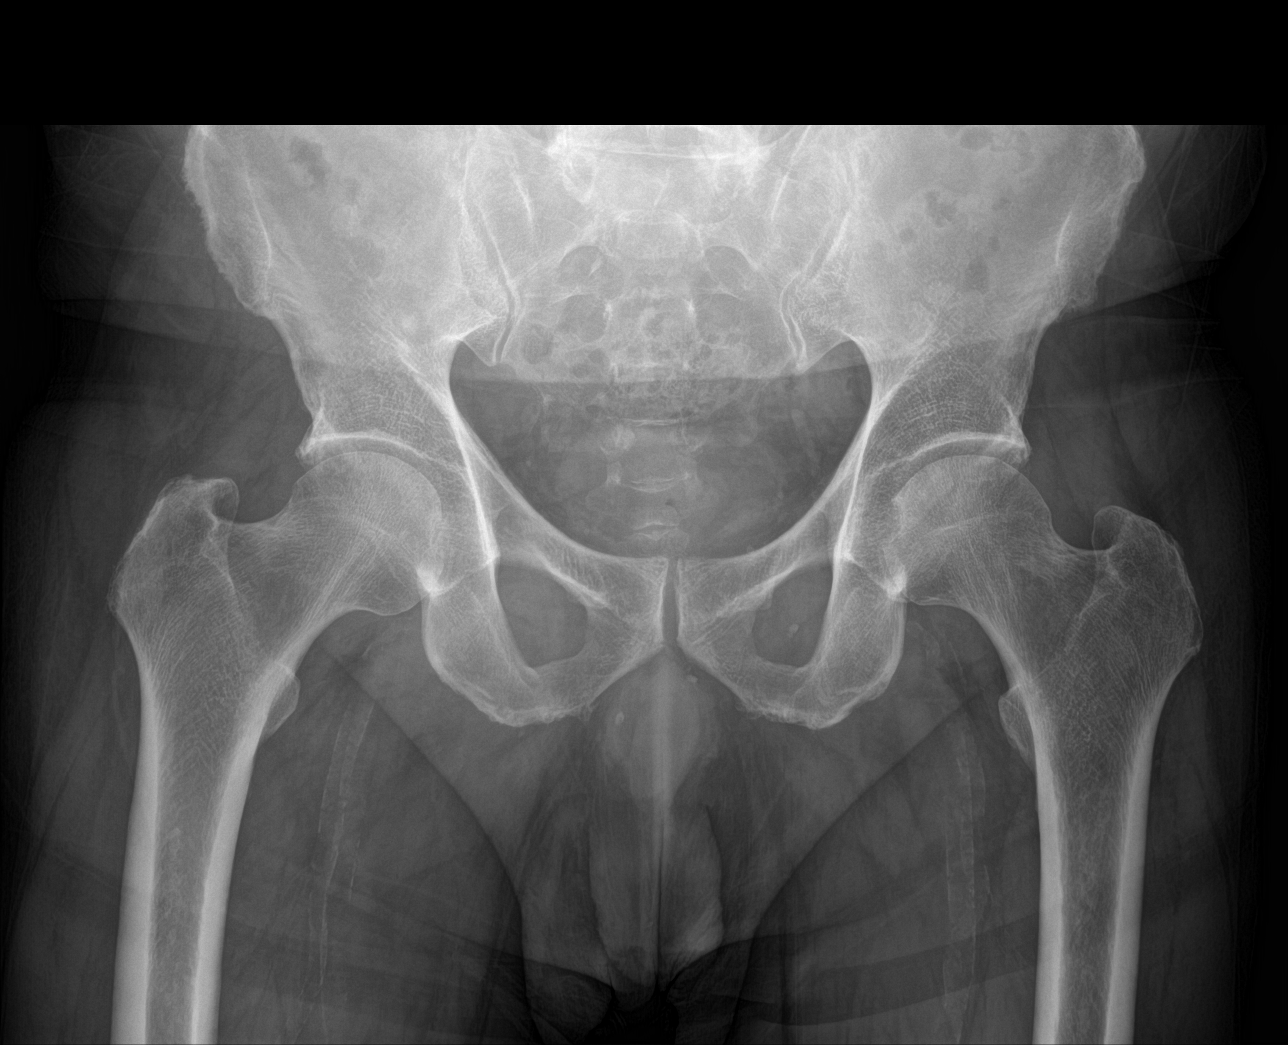

[hip ap]
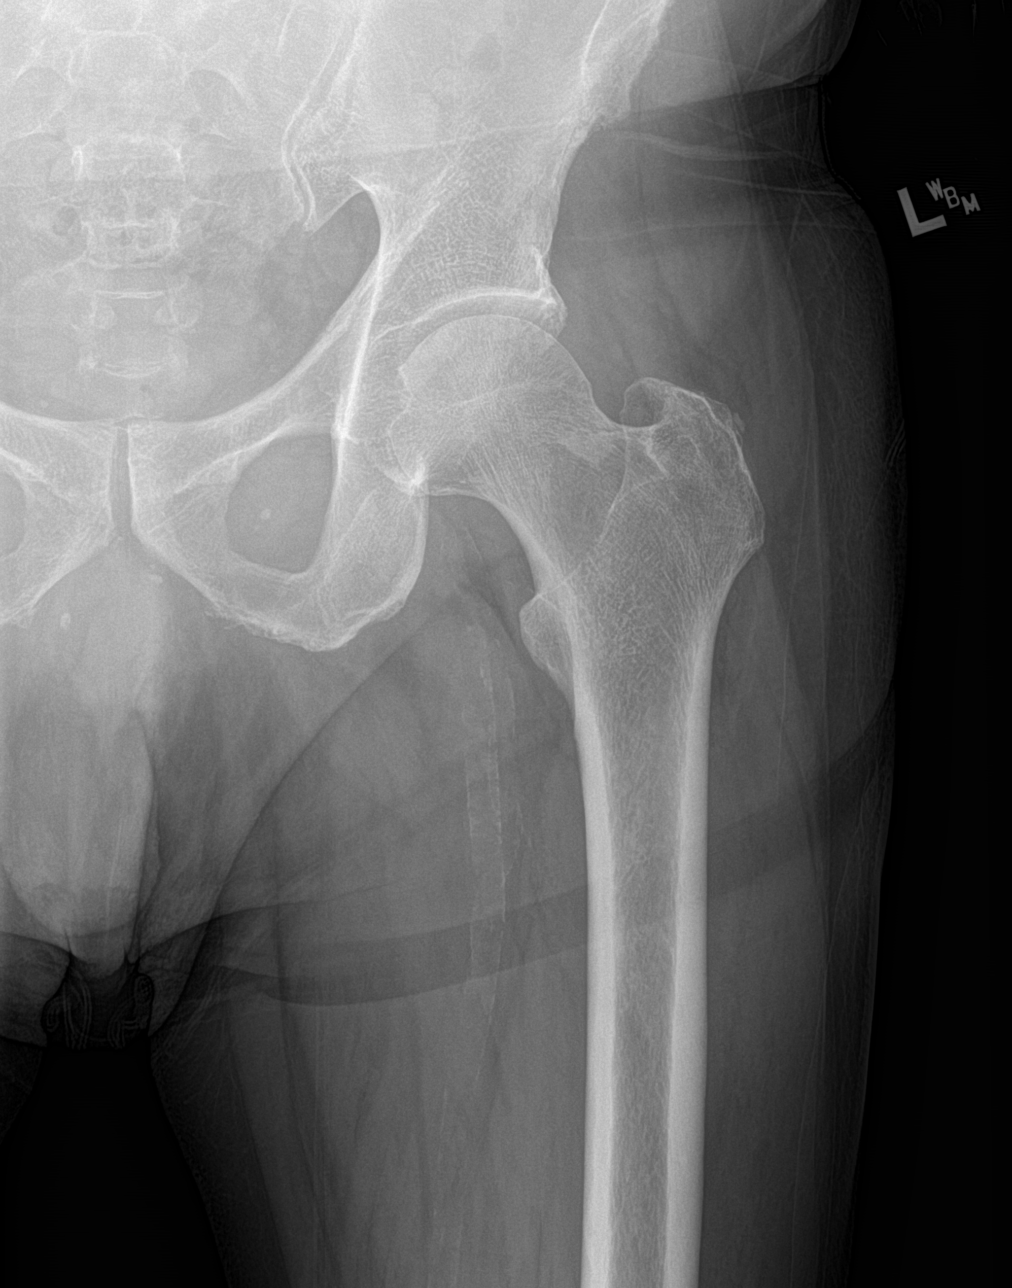

[hip lat]
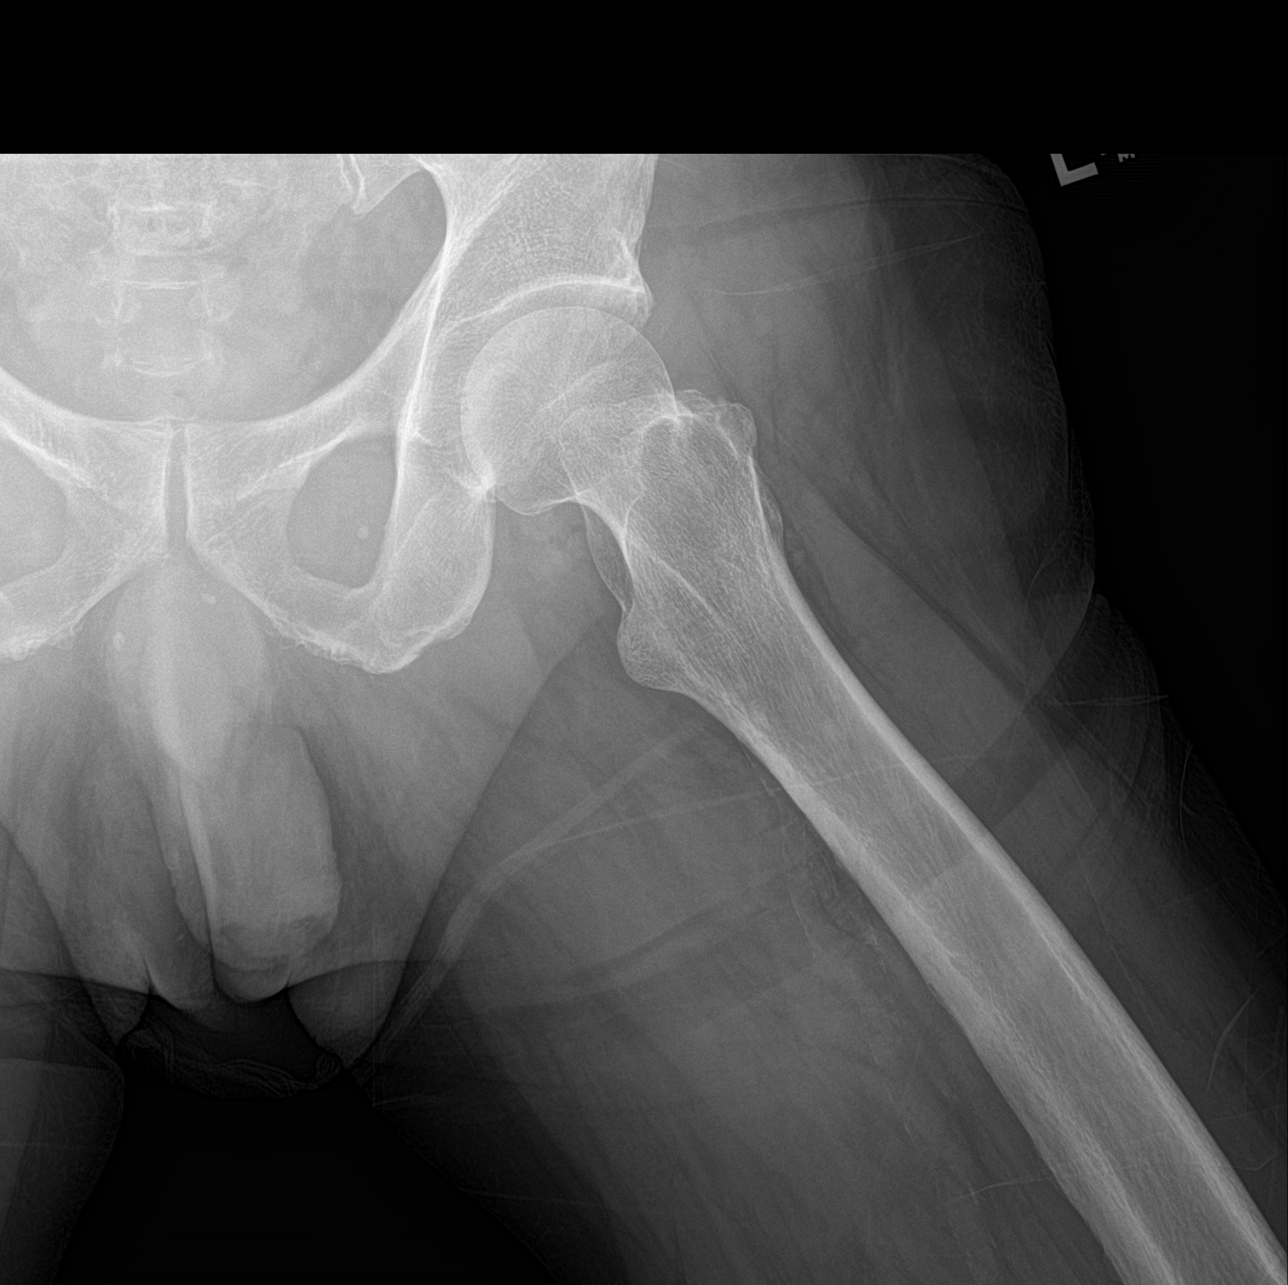

[3 of 3 positions shown; findings below may reference images not displayed]

FINDINGS: No acute fracture or dislocation. Alignment is unremarkable. Mild
degenerative changes in the bilateral hips, with relatively
preserved hip joint spaces. Vascular calcifications.
IMPRESSION: No acute fracture or dislocation. Mild degenerative changes without
significant hip joint space loss.

## 2023-05-18 ENCOUNTER — Ambulatory Visit
Admission: RE | Admit: 2023-05-18 | Discharge: 2023-05-18 | Disposition: A | Discharge status: Home / Self Care | Source: Ambulatory Visit | Attending: Orthopedic Surgery | Admitting: Orthopedic Surgery

## 2023-05-18 DIAGNOSIS — M79672 Pain in left foot: Secondary | ICD-10-CM | POA: Diagnosis not present

## 2023-05-18 DIAGNOSIS — M216X2 Other acquired deformities of left foot: Secondary | ICD-10-CM | POA: Diagnosis not present

## 2023-05-18 DIAGNOSIS — M6788 Other specified disorders of synovium and tendon, other site: Secondary | ICD-10-CM | POA: Insufficient documentation

## 2023-05-18 DIAGNOSIS — Z96662 Presence of left artificial ankle joint: Secondary | ICD-10-CM | POA: Diagnosis not present

## 2023-05-18 DIAGNOSIS — M792 Neuralgia and neuritis, unspecified: Secondary | ICD-10-CM | POA: Insufficient documentation

## 2023-05-18 DIAGNOSIS — M24572 Contracture, left ankle: Secondary | ICD-10-CM | POA: Insufficient documentation

## 2023-05-18 DIAGNOSIS — M25572 Pain in left ankle and joints of left foot: Secondary | ICD-10-CM | POA: Diagnosis not present

## 2023-05-29 ENCOUNTER — Other Ambulatory Visit: Payer: Self-pay | Admitting: Emergency Medicine

## 2023-05-29 ENCOUNTER — Encounter: Payer: Self-pay | Admitting: Cardiovascular Disease

## 2023-05-29 ENCOUNTER — Ambulatory Visit: Attending: Cardiovascular Disease

## 2023-05-29 DIAGNOSIS — I5020 Unspecified systolic (congestive) heart failure: Secondary | ICD-10-CM

## 2023-05-29 LAB — ECHOCARDIOGRAM LIMITED
Area-P 1/2: 3.53 cm2
S' Lateral: 4 cm

## 2023-05-29 MED ORDER — SACUBITRIL-VALSARTAN 24-26 MG PO TABS: 1.0000 | ORAL_TABLET | Freq: Two times a day (BID) | ORAL | 3 refills | Status: DC

## 2023-06-02 ENCOUNTER — Telehealth: Payer: Self-pay | Admitting: Cardiology

## 2023-06-02 NOTE — Telephone Encounter (Incomplete)
 Called to confirm/remind patient of their appointment at the [facility] on 06/03/23***.   Appointment:   [x] Confirmed  [] Left mess   [] No answer/No voice mail  [] Phone not in service  Patient reminded to bring all medications and/or complete list.  Confirmed patient has transportation. Gave directions, instructed to utilize valet parking.

## 2023-06-03 ENCOUNTER — Other Ambulatory Visit (HOSPITAL_COMMUNITY): Payer: Self-pay | Admitting: Cardiology

## 2023-06-03 ENCOUNTER — Encounter: Payer: Self-pay | Admitting: Cardiology

## 2023-06-03 ENCOUNTER — Ambulatory Visit: Attending: Cardiology | Admitting: Cardiology

## 2023-06-03 ENCOUNTER — Inpatient Hospital Stay (HOSPITAL_COMMUNITY)
Admission: RE | Admit: 2023-06-03 | Discharge: 2023-06-03 | Disposition: A | Discharge status: Home / Self Care | Source: Ambulatory Visit | Attending: Cardiology | Admitting: Cardiology

## 2023-06-03 VITALS — BP 104/86 | HR 71 | Wt 168.4 lb

## 2023-06-03 DIAGNOSIS — Z955 Presence of coronary angioplasty implant and graft: Secondary | ICD-10-CM | POA: Diagnosis not present

## 2023-06-03 DIAGNOSIS — E785 Hyperlipidemia, unspecified: Secondary | ICD-10-CM | POA: Insufficient documentation

## 2023-06-03 DIAGNOSIS — E119 Type 2 diabetes mellitus without complications: Secondary | ICD-10-CM | POA: Diagnosis not present

## 2023-06-03 DIAGNOSIS — I110 Hypertensive heart disease with heart failure: Secondary | ICD-10-CM | POA: Diagnosis not present

## 2023-06-03 DIAGNOSIS — Z7902 Long term (current) use of antithrombotics/antiplatelets: Secondary | ICD-10-CM | POA: Diagnosis not present

## 2023-06-03 DIAGNOSIS — I5022 Chronic systolic (congestive) heart failure: Secondary | ICD-10-CM | POA: Insufficient documentation

## 2023-06-03 DIAGNOSIS — I252 Old myocardial infarction: Secondary | ICD-10-CM | POA: Diagnosis not present

## 2023-06-03 DIAGNOSIS — I5020 Unspecified systolic (congestive) heart failure: Secondary | ICD-10-CM | POA: Diagnosis not present

## 2023-06-03 DIAGNOSIS — Z7982 Long term (current) use of aspirin: Secondary | ICD-10-CM | POA: Insufficient documentation

## 2023-06-03 DIAGNOSIS — I493 Ventricular premature depolarization: Secondary | ICD-10-CM

## 2023-06-03 DIAGNOSIS — Z7984 Long term (current) use of oral hypoglycemic drugs: Secondary | ICD-10-CM | POA: Insufficient documentation

## 2023-06-03 DIAGNOSIS — Z87891 Personal history of nicotine dependence: Secondary | ICD-10-CM | POA: Insufficient documentation

## 2023-06-03 DIAGNOSIS — Z79899 Other long term (current) drug therapy: Secondary | ICD-10-CM | POA: Diagnosis not present

## 2023-06-03 DIAGNOSIS — I255 Ischemic cardiomyopathy: Secondary | ICD-10-CM | POA: Diagnosis not present

## 2023-06-03 MED ORDER — CARVEDILOL 6.25 MG PO TABS: 6.2500 mg | ORAL_TABLET | Freq: Two times a day (BID) | ORAL | 3 refills | Status: DC

## 2023-06-03 MED ORDER — EZETIMIBE 10 MG PO TABS: 10.0000 mg | ORAL_TABLET | Freq: Every day | ORAL | 2 refills | Status: DC

## 2023-06-03 MED ORDER — SPIRONOLACTONE 25 MG PO TABS: 12.5000 mg | ORAL_TABLET | Freq: Every day | ORAL | 1 refills | Status: DC

## 2023-06-03 NOTE — Patient Instructions (Signed)
 Medication Changes:  START Carvedilol 6.25mg  (1 tab) two times daily  START Spironolactone 12.5mg  (1/2 tab) daily  START Zetia 10mg  (1 tab) daily  CONTINUE Aspirin 81mg  daily  Lab Work:  Go DOWN to LOWER LEVEL (LL) to have your blood work completed inside of [employer] office TODAY.  IN ONE WEEK, Go over to the MEDICAL MALL in order to have your blood work drawn. Go pass the gift shop and have your blood work completed.  We will only call you if the results are abnormal or if the provider would like to make medication changes.   Testing/Procedures:  Your provider has recommended that  you wear a Zio Patch for 7 days.  This monitor will record your heart rhythm for our review.  IF you have any symptoms while wearing the monitor please press the button.  If you have any issues with the patch or you notice a red or orange light on it please call the company at [phone].  Once you remove the patch please mail it back to the company as soon as possible so we can get the results.   Referrals:  You have been referred to [facility] in [place] to discuss an ICD. That office will contact you in order to schedule an appointment.    Follow-Up in: Please follow up with the Heart Failure Pharmacist in 1 week. Please bring all of your medications to this visit. Also follow up with the [facility] in 1 month with Dr. Shirlee Latch.  At the [facility], you and your health needs are our priority. We have a designated team specialized in the treatment of Heart Failure. This Care Team includes your primary Heart Failure Specialized Cardiologist (physician), Advanced Practice Providers (APPs- Physician Assistants and Nurse Practitioners), and Pharmacist who all work together to provide you with the care you need, when you need it.   You may see any of the following providers on your designated Care Team at your next follow up:  Dr. Arvilla Meres Dr. Marca Ancona Dr. Dorthula Nettles Dr. Theresia Bough Clarisa Kindred, FNP Enos Fling, RPH-CPP  Please be sure to bring in all your medications bottles to every appointment.   Need to Contact us:  If you have any questions or concerns before your next appointment please send [place] a message through [place] or call our office at [phone].    TO LEAVE A MESSAGE FOR THE NURSE SELECT OPTION 2, PLEASE LEAVE A MESSAGE INCLUDING: YOUR NAME DATE OF BIRTH CALL BACK NUMBER REASON FOR CALL**this is important as we prioritize the call backs  YOU WILL RECEIVE A CALL BACK THE SAME DAY AS LONG AS YOU CALL BEFORE 4:00 PM

## 2023-06-03 NOTE — Progress Notes (Signed)
 PCP: Smitty Cords, DO Cardiology: Dr. Mariah Milling HF Cardiology: Dr. Shirlee Latch  Chief complaint: CHF  73 y.o. with history of CAD, ischemic cardiomyopathy, diabetes, and HTN was referred by Dr. Mariah Milling for evaluation of CHF. Patient was admitted in 10/24 with anterior MI. Cath showed occluded proximal LAD treated with DES, D1 and D2 jailed by stent, residual 60% distal LAD.  Echo in 10/24 showed EF 35-40%, normal RV.  Repeat echo in 2/25 showed EF 30-35%, anteroseptal akinesis, RV normal, mild MR.    Patient has been taking all his meds.  He stopped aspirin transiently for hematuria but has restarted it. He is short of breath/fatigued after walking about 1 block.  Not short of breath, no chest pain.  Frequent PVCs noted on ECG.  He denies palpitations, lightheadedness, or syncope. No orthopnea/PND.    ECG (personally reviewed): NSR, PVCs, LAFB, old ASMI  Labs (2/25): LDL 69, K 4.5, creatinine 0.98, hgb 15.6   PMH: 1. Type 2 diabetes 2. HTN 3. Hyperlipidemia 4. CAD: Anterior MI in 10/24, cath with occluded proximal LAD treated with DES, D1 and D2 jailed by stent, residual 60% distal LAD.  5. Chronic systolic CHF: Ischemic cardiomyopathy.  - Echo (10/24): EF 35-40%, normal RV - Echo (3/25): EF 30-35%, anteroseptal akinesis, RV normal, mild MR.   SH: Retired, prior smoker but has quit. Prior heavy ETOH. Lives in [place] with wife. Spanish-speaking.   Family History  Problem Relation Age of Onset   Heart disease Mother    Hypertension Brother    ROS: All systems reviewed and negative except as per HPI.   Current Outpatient Medications  Medication Sig Dispense Refill   aspirin EC 81 MG tablet Take 81 mg by mouth daily. Swallow whole.     ezetimibe (ZETIA) 10 MG tablet Take 1 tablet (10 mg total) by mouth daily. 90 tablet 2   spironolactone (ALDACTONE) 25 MG tablet Take 0.5 tablets (12.5 mg total) by mouth daily. 45 tablet 1   ACCU-CHEK AVIVA PLUS test strip Use to check blood sugar  up to 1 x per day (Patient not taking: Reported on 05/04/2023) 100 each 12   Accu-Chek Softclix Lancets lancets Use to check blood sugar up to 1 x per day (Patient not taking: Reported on 05/04/2023) 100 each 12   atorvastatin (LIPITOR) 80 MG tablet Take 1 tablet (80 mg total) by mouth at bedtime. 90 tablet 3   Blood Glucose Monitoring Suppl (ACCU-CHEK AVIVA PLUS) w/Device KIT Use to check blood sugar up to 1 x per day (Patient not taking: Reported on 05/04/2023) 1 kit 0   carvedilol (COREG) 6.25 MG tablet Take 1 tablet (6.25 mg total) by mouth 2 (two) times daily with a meal. 60 tablet 3   dicyclomine (BENTYL) 10 MG capsule Take 1 capsule (10 mg total) by mouth 4 (four) times daily -  before meals and at bedtime. As needed for abdominal pain cramping (Patient not taking: Reported on 05/04/2023) 30 capsule 2   dorzolamide-timolol (COSOPT) 22.3-6.8 MG/ML ophthalmic solution Place 1 drop into both eyes 2 (two) times daily.     empagliflozin (JARDIANCE) 10 MG TABS tablet Take 1 tablet (10 mg total) by mouth daily. 30 tablet 2   finasteride (PROSCAR) 5 MG tablet Take 1 tablet (5 mg total) by mouth daily. 90 tablet 3   folic acid (FOLVITE) 1 MG tablet Take 1 tablet (1 mg total) by mouth daily. 30 tablet 2   gabapentin (NEURONTIN) 600 MG tablet Take 1 tablet (600  mg total) by mouth 3 (three) times daily. 270 tablet 3   isosorbide mononitrate (IMDUR) 30 MG 24 hr tablet TAKE 1/2 TABLET(15 MG) BY MOUTH DAILY 45 tablet 0   latanoprost (XALATAN) 0.005 % ophthalmic solution Place 1 drop into both eyes at bedtime.  6   levothyroxine (SYNTHROID) 75 MCG tablet Take 75 mcg by mouth daily before breakfast.     Multiple Vitamin (MULTIVITAMIN WITH MINERALS) TABS tablet Take 1 tablet by mouth daily. 30 tablet 2   nitroGLYCERIN (NITROSTAT) 0.4 MG SL tablet Place 1 tablet (0.4 mg total) under the tongue every 5 (five) minutes as needed for chest pain. 25 tablet 2   omeprazole (PRILOSEC) 40 MG capsule TAKE 1 CAPSULE(40 MG) BY  MOUTH DAILY BEFORE BREAKFAST 90 capsule 1   sacubitril-valsartan (ENTRESTO) 24-26 MG Take 1 tablet by mouth 2 (two) times daily. 180 tablet 3   ticagrelor (BRILINTA) 90 MG TABS tablet Take 1 tablet (90 mg total) by mouth 2 (two) times daily. 60 tablet 2   No current facility-administered medications for this visit.   BP 104/86   Pulse 71   Wt 168 lb 6.4 oz (76.4 kg)   SpO2 100%   BMI 27.18 kg/m  General: NAD Neck: No JVD, no thyromegaly or thyroid nodule.  Lungs: Clear to auscultation bilaterally with normal respiratory effort. CV: Nondisplaced PMI.  Heart regular S1/S2, no S3/S4, no murmur.  No peripheral edema.  No carotid bruit.  Normal pedal pulses.  Abdomen: Soft, nontender, no hepatosplenomegaly, no distention.  Skin: Intact without lesions or rashes.  Neurologic: Alert and oriented x 3.  Psych: Normal affect. Extremities: No clubbing or cyanosis.  HEENT: Normal.   Assessment/Plan: 1. Chronic systolic CHF: Ischemic cardiomyopathy. Most recent echo in 3/25 showed EF 30-35%, anteroseptal akinesis, RV normal, mild MR.  NYHA class II symptoms, he is not volume overloaded on exam.  - Increase Coreg to 6.25 mg bid. - Continue Jardiance 10 mg daily.  - Continue Entresto 24/26 bid.  - Add spironolactone 12.5 mg daily.  BMET/BNP today and in 1 week.  - I will refer to EP to consider ICD placement given persistently low EF post-MI.  He would not be a CRT candidate with narrow QRS.  2. CAD: Anterior MI in 10/24, cath with occluded proximal LAD treated with DES, D1 and D2 jailed by stent, residual 60% distal LAD. No chest pain.  - Continue ASA 81 and ticagrelor 90 bid.  Consider transition to single agent clopidogrel at 1 year.  - Continue atorvastatin 80 mg daily.  3. Hyperlipidemia: Goal LDL < 55.  - Continue atorvastatin 80 mg daily.  - Add Zetia 10 mg daily.  Lipids in 2 months.  4. PVCs: Frequent PVCs noted today.  - Quantify with 7 day Zio monitor.  - Increasing Coreg as above.    Followup in 2 wks with HF pharmacist for med titration/med review, followup with me in 1 month.   I spent 56 minutes reviewing records, interviewing/examining patient, and managing orders.   Marca Ancona 06/03/2023

## 2023-06-04 LAB — BASIC METABOLIC PANEL WITH GFR
BUN/Creatinine Ratio: 15 (ref 10–24)
BUN: 13 mg/dL (ref 8–27)
CO2: 21 mmol/L (ref 20–29)
Calcium: 9 mg/dL (ref 8.6–10.2)
Chloride: 106 mmol/L (ref 96–106)
Creatinine, Ser: 0.85 mg/dL (ref 0.76–1.27)
Glucose: 125 mg/dL — ABNORMAL HIGH (ref 70–99)
Potassium: 4.7 mmol/L (ref 3.5–5.2)
Sodium: 143 mmol/L (ref 134–144)
eGFR: 92 mL/min/{1.73_m2} (ref >59)

## 2023-06-04 LAB — BRAIN NATRIURETIC PEPTIDE: BNP: 63.8 pg/mL (ref 0.0–100.0)

## 2023-06-05 ENCOUNTER — Ambulatory Visit: Admitting: Family Medicine

## 2023-06-05 ENCOUNTER — Encounter: Payer: Self-pay | Admitting: Family Medicine

## 2023-06-05 ENCOUNTER — Ambulatory Visit: Payer: Self-pay

## 2023-06-05 VITALS — BP 120/72 | HR 63 | Ht 66.0 in | Wt 165.0 lb

## 2023-06-05 DIAGNOSIS — R58 Hemorrhage, not elsewhere classified: Secondary | ICD-10-CM | POA: Diagnosis not present

## 2023-06-05 DIAGNOSIS — M79601 Pain in right arm: Secondary | ICD-10-CM | POA: Diagnosis not present

## 2023-06-05 DIAGNOSIS — S40021A Contusion of right upper arm, initial encounter: Secondary | ICD-10-CM | POA: Diagnosis not present

## 2023-06-05 NOTE — Telephone Encounter (Signed)
 Information obtained from [place], daughter.   Chief Complaint: fall, now arm pain Symptoms: pain to upper R arm Frequency: a few days ago fall occured Pertinent Negatives: Patient denies fever, redness, CMS changes, CP, SOB Disposition: [] ED /[] Urgent Care (no appt availability in office) / [x] Appointment(In office/virtual)/ []  [facility]/ [] Home Care/ [] Refused Recommended Disposition /[] [facility]/ []  Follow-up with PCP Additional Notes: Pt slipped and fell a few days ago, No LOC. Pt now having pain R shoulder down to elbow. Per daughter there is swelling and bruising. Pt states he can still use arm but it is more painful. Pt states CMS intact. Pt scheduled with PCP today.  Copied from CRM [id number]. Topic: Clinical - Red Word Triage >> Jun 05, 2023 12:41 PM Albin Felling L wrote: Red Word that prompted transfer to Nurse Triage: Fall, in pain Reason for Disposition  [1] MODERATE pain (e.g., interferes with normal activities) AND [2] present > 3 days  Answer Assessment - Initial Assessment Questions 1. ONSET: "When did the pain start?"     A few days ago fell 2. LOCATION: "Where is the pain located?"     R upper arm 3. PAIN: "How bad is the pain?" (Scale 1-10; or mild, moderate, severe)   - MILD (1-3): Doesn't interfere with normal activities.   - MODERATE (4-7): Interferes with normal activities (e.g., work or school) or awakens from sleep.   - SEVERE (8-10): Excruciating pain, unable to do any normal activities, unable to hold a cup of water.     5 worse with movement 4. WORK OR EXERCISE: "Has there been any recent work or exercise that involved this part of the body?"     denies 5. CAUSE: "What do you think is causing the arm pain?"     Injured during the fall 6. OTHER SYMPTOMS: "Do you have any other symptoms?" (e.g., neck pain, swelling, rash, fever, numbness, weakness)     swelling  Protocols used: Arm Pain-A-AH

## 2023-06-05 NOTE — Progress Notes (Signed)
 Subjective:    Patient ID: Ross Riley, male    DOB: January 18, 1951, 73 y.o.   MRN: [id number]  Ross Riley is a 73 y.o. male presenting on 06/05/2023 for hematoma (Right upper extremity triceps)  Patient presents for a same day appointment.   HPI  Discussed the use of AI scribe software for clinical note transcription with the patient, who gave verbal consent to proceed.  History of Present Illness   Ross Riley is a 73 year old male who presents with a fall-related upper arm injury.  He experienced a fall in the shower last week, resulting in an injury to his upper arm. Traumatic injury to arm. Did not hit head and did not have any loss of consciousness. The area is significantly bruised, described as 'all over this color, the purple way,' with the bruising spreading downwards due to gravity. Pain is primarily present when the area is touched, with some soreness. There is a 'little pain' in another area of the arm, but no significant pain elsewhere. He has not experienced any limitations in his range of motion, and shoulder movement is unaffected. He denies any significant interference with daily activities due to pain, stating that the pain is not too much and is more concerned about the bruising.  He applied ice once on the day of the injury but has not used it since. He has not used any wraps or heat on the injury. Baclofen is available for muscle relaxation, which he has used previously for muscle issues. He has not taken any pain relief medications like ibuprofen or Advil due to potential issues with these medications.  No significant pain interfering with day-to-day function and no issues with shoulder movement. Pain is primarily when the area is touched and not during movement.           12/26/2022    2:03 PM 10/24/2022    2:25 PM 08/07/2022    2:31 PM  Depression screen PHQ 2/9  Decreased Interest 0 0 1  Down, Depressed, Hopeless 0 1 1  PHQ - 2 Score 0 1 2  Altered  sleeping 0 0 1  Tired, decreased energy 0 1 1  Change in appetite 0 0 0  Feeling bad or failure about yourself  0 0 0  Trouble concentrating 0 0 0  Moving slowly or fidgety/restless 0 0 0  Suicidal thoughts 0 3 0  PHQ-9 Score 0 5 4  Difficult doing work/chores Not difficult at all Somewhat difficult Not difficult at all       12/26/2022    2:03 PM 10/24/2022    2:26 PM 06/23/2022    4:34 PM 04/25/2022   10:24 AM  GAD 7 : Generalized Anxiety Score  Nervous, Anxious, on Edge 0 0 0 1  Control/stop worrying 0 0 0 1  Worry too much - different things 0 0 0 0  Trouble relaxing 0 1 0 1  Restless 0 1 0 0  Easily annoyed or irritable 0 0 0 0  Afraid - awful might happen 0 0 0 0  Total GAD 7 Score 0 2 0 3  Anxiety Difficulty Not difficult at all Not difficult at all  Not difficult at all    Social History   Tobacco Use   Smoking status: Former    Current packs/day: 0.00    Average packs/day: 1 pack/day for 32.0 years (32.0 ttl pk-yrs)    Types: Cigarettes    Start date: 03/04/1959  Quit date: 03/04/1991    Years since quitting: 32.2   Smokeless tobacco: Former  Building services engineer status: Never Used  Substance Use Topics   Alcohol use: Yes    Alcohol/week: 7.0 standard drinks of alcohol    Types: 7 Shots of liquor per week    Comment: 1-2 shots of vodka daily   Drug use: No    Review of Systems Per HPI unless specifically indicated above     Objective:    BP 120/72 (BP Location: Left Arm, Patient Position: Sitting, Cuff Size: Normal)   Pulse 63   Ht 5\' 6"  (1.676 m)   Wt 165 lb (74.8 kg)   SpO2 97%   BMI 26.63 kg/m   Wt Readings from Last 3 Encounters:  06/05/23 165 lb (74.8 kg)  06/03/23 168 lb 6.4 oz (76.4 kg)  05/04/23 166 lb (75.3 kg)    Physical Exam Vitals and nursing note reviewed.  Constitutional:      General: He is not in acute distress.    Appearance: Normal appearance. He is well-developed. He is not diaphoretic.     Comments: Well-appearing,  comfortable, cooperative  HENT:     Head: Normocephalic and atraumatic.  Eyes:     General:        Right eye: No discharge.        Left eye: No discharge.     Conjunctiva/sclera: Conjunctivae normal.  Cardiovascular:     Rate and Rhythm: Normal rate.  Pulmonary:     Effort: Pulmonary effort is normal.  Musculoskeletal:     Comments: Right upper extremity tricep region with palpable firm hematoma with some mild edema, mild tender. Extensive ecchymosis gravity dependent down the arm see photos  Full active range of motion R shoulder flex ext rotation. Biceps and triceps flex extend without any limitation, full range of motion. No biceps muscle belly bulge when flex.  Muscle strength intact 5/5 upper extremities symmetrical   Skin:    General: Skin is warm and dry.     Findings: No erythema or rash.  Neurological:     Mental Status: He is alert and oriented to person, place, and time.     Sensory: No sensory deficit.  Psychiatric:        Mood and Affect: Mood normal.        Behavior: Behavior normal.        Thought Content: Thought content normal.     Comments: Well groomed, good eye contact, normal speech and thoughts    ecchymosis  Right upper extremity       Results for orders placed or performed in visit on 06/03/23  Basic Metabolic Panel (BMET)   Collection Time: 06/03/23  2:51 PM  Result Value Ref Range   Glucose 125 (H) 70 - 99 mg/dL   BUN 13 8 - 27 mg/dL   Creatinine, Ser 0.98 0.76 - 1.27 mg/dL   eGFR 92 >11 BJ/YNW/2.95   BUN/Creatinine Ratio 15 10 - 24   Sodium 143 134 - 144 mmol/L   Potassium 4.7 3.5 - 5.2 mmol/L   Chloride 106 96 - 106 mmol/L   CO2 21 20 - 29 mmol/L   Calcium 9.0 8.6 - 10.2 mg/dL  B Nat Peptide   Collection Time: 06/03/23  2:51 PM  Result Value Ref Range   BNP 63.8 0.0 - 100.0 pg/mL      Assessment & Plan:   Problem List Items Addressed This Visit   None Visit  Diagnoses       Right arm pain    -  Primary     Traumatic  hematoma of upper arm, right, initial encounter         Ecchymosis            Hematoma of the upper arm/ triceps muscle, traumatic Acute 1 week ago traumatic injury fall accidental slip in shower, R upper arm tricep / bicep region contusion with significant Hematoma from subcutaneous bleeding due to fall.  No clinical evidence of fracture or significant muscle tear. No bony pain. Full active range of motion and strength without deficit. Healing expected in a few weeks.  No X-ray required today based on lack of bony abnormality or range of motion deficit  - Apply alternating ice and heat. - Use ace wrap for compression. - Consider baclofen for muscle relaxation if needed. - Apply Voltaren gel as needed. - Consider sports medicine referral if no improvement in 1-2 weeks.        No orders of the defined types were placed in this encounter.   No orders of the defined types were placed in this encounter.   Follow up plan: Return if symptoms worsen or fail to improve.   Saralyn Pilar, DO [facility] 06/05/2023, 2:14 PM

## 2023-06-05 NOTE — Patient Instructions (Addendum)
 Thank you for coming to the office today.  Likely Hematoma  Use Ice and Heat alternating and Compression with ACE Wrap to apply pressure.  Okay to use arm and shoulder. No restrictions  It can be sore and stiff with too much swelling at times.  Bruising will gradually improve and fade  Okay to take Baclofen  START anti inflammatory topical - OTC Voltaren (generic Diclofenac) topical 2-4 times a day as needed for pain swelling of affected joint for 1-2 weeks or longer.  No X-rays required.   Please schedule a Follow-up Appointment to: Return if symptoms worsen or fail to improve.  If you have any other questions or concerns, please feel free to call the office or send a message through MyChart. You may also schedule an earlier appointment if necessary.  Additionally, you may be receiving a survey about your experience at our office within a few days to 1 week by e-mail or mail. We value your feedback.  Saralyn Pilar, DO [facility], [place]

## 2023-06-07 ENCOUNTER — Other Ambulatory Visit: Payer: Self-pay | Admitting: Cardiovascular Disease

## 2023-06-08 NOTE — Progress Notes (Unsigned)
 Advanced Heart Failure Clinic Note  PCP: Smitty Cords, DO PCP-Cardiologist: Julien Nordmann, MD HF-Cardiologist: Marca Ancona, MD  HPI:  73 y.o. with history of CAD, ischemic cardiomyopathy, diabetes, and HTN was referred by Dr. Mariah Milling for evaluation of CHF. Patient was admitted in 10/24 with anterior MI. Cath showed occluded proximal LAD treated with DES, D1 and D2 jailed by stent, residual 60% distal LAD.  Echo in 12/2022 showed EF 35-40%, normal RV.  Repeat echo in 04/2023 showed EF 30-35%, anteroseptal akinesis, RV normal, mild MR.     Seen by Dr. Shirlee Latch on 06/03/23 where carvedilol was increased to 6.25 mg BID and spironolactone 12.5 mg daily was started.  Today Ross Riley returns to Heart Failure Clinic for pharmacist medication titration. Reports feeling ***. {Reports/Denies:210917258} {ACTIONS;DENIES/REPORTS:21021675::"Denies"} being able to complete all activities of daily living (ADLs). Is *** active throughout the day. Weight at home is *** pounds. Takes {CHL AMB AH[facility] Medications:210917260} ***. Appetite ***. {Does Follow/Does Not Follow:210917261} a low sodium diet.  Current Heart Failure Medications: Loop diuretic: Beta-Blocker: ACEI/ARB/ARNI: MRA: SGLT2i: Other:  Has the patient been experiencing any side effects to the medications prescribed? {yes/no:20286}  Does the patient have any problems obtaining medications due to transportation or finances? {yes/no:20286}  Understanding of regimen: {CHL AMB [facility]FC Excellent/Good/Fair/Poor:210917262}  Understanding of indications: {CHL AMB [facility]FC Excellent/Good/Fair/Poor:210917262}  Potential of adherence: {CHL AMB [facility] Excellent/Good/Fair/Poor:210917262}  Patient understands to avoid NSAIDs.  Patient understands to avoid decongestants.  Pertinent Lab Values: Creat  Date Value Ref Range Status  04/27/2023 0.87 0.70 - 1.28 mg/dL Final   Creatinine, Ser  Date Value Ref Range Status  06/03/2023 0.85 0.76 -  1.27 mg/dL Final   BUN  Date Value Ref Range Status  06/03/2023 13 8 - 27 mg/dL Final  66/44/0347 11 7 - 18 mg/dL Final   Potassium  Date Value Ref Range Status  06/03/2023 4.7 3.5 - 5.2 mmol/L Final  12/07/2012 3.3 (L) 3.5 - 5.1 mmol/L Final   Sodium  Date Value Ref Range Status  06/03/2023 143 134 - 144 mmol/L Final  12/07/2012 139 136 - 145 mmol/L Final   BNP  Date Value Ref Range Status  06/03/2023 63.8 0.0 - 100.0 pg/mL Final    Comment:    [employer] ADVIA Centaur XP methodology   Magnesium  Date Value Ref Range Status  12/21/2022 2.0 1.7 - 2.4 mg/dL Final    Comment:    Performed at [facility], [address] [place], [place] [address]   TSH  Date Value Ref Range Status  04/27/2023 2.01 0.40 - 4.50 mIU/L Final    Vital Signs: There were no vitals filed for this visit.  Assessment/Plan: 1. Chronic systolic CHF: Ischemic cardiomyopathy. Most recent echo in 3/25 showed EF 30-35%, anteroseptal akinesis, RV normal, mild MR.  NYHA class II symptoms, he is not volume overloaded on exam.  - Increase Coreg to 6.25 mg bid. - Continue Jardiance 10 mg daily.  - Continue Entresto 24/26 bid.  - Add spironolactone 12.5 mg daily.  BMET/BNP today and in 1 week.  - I will refer to EP to consider ICD placement given persistently low EF post-MI.  He would not be a CRT candidate with narrow QRS.  2. CAD: Anterior MI in 10/24, cath with occluded proximal LAD treated with DES, D1 and D2 jailed by stent, residual 60% distal LAD. No chest pain.  - Continue ASA 81 and ticagrelor 90 bid.  Consider transition to single agent clopidogrel at 1  year.  - Continue atorvastatin 80 mg daily.  3. Hyperlipidemia: Goal LDL < 55.  - Continue atorvastatin 80 mg daily.  - Add Zetia 10 mg daily.  Lipids in 2 months.  4. PVCs: Frequent PVCs noted today.  - Quantify with 7 day Zio monitor.  - Increasing Coreg as above.   Follow up: ***  ***

## 2023-06-10 ENCOUNTER — Other Ambulatory Visit
Admission: RE | Admit: 2023-06-10 | Discharge: 2023-06-10 | Disposition: A | Discharge status: Home / Self Care | Source: Ambulatory Visit | Attending: Cardiology | Admitting: Cardiology

## 2023-06-10 ENCOUNTER — Ambulatory Visit (HOSPITAL_BASED_OUTPATIENT_CLINIC_OR_DEPARTMENT_OTHER): Admitting: Pharmacist

## 2023-06-10 DIAGNOSIS — I5020 Unspecified systolic (congestive) heart failure: Secondary | ICD-10-CM | POA: Diagnosis not present

## 2023-06-10 DIAGNOSIS — I5022 Chronic systolic (congestive) heart failure: Secondary | ICD-10-CM

## 2023-06-10 LAB — BASIC METABOLIC PANEL WITH GFR
Anion gap: 6 (ref 5–15)
BUN: 12 mg/dL (ref 8–23)
CO2: 26 mmol/L (ref 22–32)
Calcium: 8.6 mg/dL — ABNORMAL LOW (ref 8.9–10.3)
Chloride: 110 mmol/L (ref 98–111)
Creatinine, Ser: 0.98 mg/dL (ref 0.61–1.24)
GFR, Estimated: >60 mL/min (ref >60)
Glucose, Bld: 197 mg/dL — ABNORMAL HIGH (ref 70–99)
Potassium: 3.8 mmol/L (ref 3.5–5.1)
Sodium: 142 mmol/L (ref 135–145)

## 2023-06-10 MED ORDER — ENTRESTO 49-51 MG PO TABS: 1.0000 | ORAL_TABLET | Freq: Two times a day (BID) | ORAL | 2 refills | Status: DC

## 2023-06-10 NOTE — Patient Instructions (Addendum)
 It was a pleasure seeing you today!  MEDICATIONS: -Will reach out for medication changes based on your labs today -Call if you have questions about your medications.  LABS: -We will call you if your labs need attention.  NEXT APPOINTMENT: Return to clinic in 2 weeks with Dr. Shirlee Latch.  In general, to take care of your heart failure: -Limit your fluid intake to 2 Liters (half-gallon) per day.   -Limit your salt intake to ideally 2-3 grams (2000-3000 mg) per day. -Weigh yourself daily and record, and bring that "weight diary" to your next appointment.  (Weight gain of 2-3 pounds in 1 day typically means fluid weight.) -The medications for your heart are to help your heart and help you live longer.   -Please contact us before stopping any of your heart medications.  Call the clinic at [phone] with questions or to reschedule future appointments.

## 2023-06-12 ENCOUNTER — Telehealth (HOSPITAL_COMMUNITY): Payer: Self-pay | Admitting: Surgery

## 2023-06-12 NOTE — Telephone Encounter (Signed)
 I called patient after receiving information that the Zio monitor had expired before or during his current wear.  I informed patient that we can order and apply new device on 4/16 during his appt with Enos Fling in the AHF Clinic at [facility].

## 2023-06-13 ENCOUNTER — Other Ambulatory Visit: Payer: Self-pay | Admitting: Cardiovascular Disease

## 2023-06-16 ENCOUNTER — Other Ambulatory Visit: Payer: Self-pay | Admitting: Cardiovascular Disease

## 2023-06-16 NOTE — Progress Notes (Unsigned)
 Advanced Heart Failure Clinic Note  PCP: Raina Bunting, DO PCP-Cardiologist: Timothy Gollan, MD HF-Cardiologist: Peder Bourdon, MD  HPI:  73 y.o. with history of CAD, ischemic cardiomyopathy, diabetes, and HTN was referred by Dr. Gollan for evaluation of CHF. Patient was admitted in 10/24 with anterior MI. Cath showed occluded proximal LAD treated with DES, D1 and D2 jailed by stent, residual 60% distal LAD.  Echo in 12/2022 showed EF 35-40%, normal RV.  Repeat echo in 04/2023 showed EF 30-35%, anteroseptal akinesis, RV normal, mild MR.     Seen by Dr. Mitzie Anda on 06/03/23 where carvedilol was increased to 6.25 mg BID and spironolactone 12.5 mg daily was started.  Seen by PCP 06/05/23 where patient reported a mechanical fall in the shower and suffered a large bruise to his arm.   Seen by CHF pharmacist on 06/10/23 where patient was feeling well with no lightheadedness/dizziness. Entresto was increased to 49/51 mg BID.   Today Ross Riley returns to Heart Failure Clinic for pharmacist medication titration. Reports feeling great overall. Denies dizziness, lightheadedness, fatigue, chest pain, palpitations, SOB, LEE, orthopnea, orthostasis, and PND. The only limit he has to activity is his ankle.Reports being able to complete all activities of daily living (ADLs). Is active throughout the day. Weight at home is ~170 pounds. Takes no loop diuretic at home. Appetite is good. Somewhat follows a low sodium diet. Wt ~170.   Current Heart Failure Medications: Loop diuretic: none Beta-Blocker: carvedilol 6.25 mg BID ACEI/ARB/ARNI: Entresto 49/51 mg BID MRA: spironolactone 12.5 mg daily SGLT2i: Jardiance 25 mg daily  Has the patient been experiencing any side effects to the medications prescribed? No  Does the patient have any problems obtaining medications due to transportation or finances? No  Understanding of regimen: Good. Reviewed all indications for his medications during the visit  today.   Understanding of indications: Good  Potential of adherence: Excellent  Patient understands to avoid NSAIDs.  Patient understands to avoid decongestants.  Pertinent Lab Values: Creat  Date Value Ref Range Status  04/27/2023 0.87 0.70 - 1.28 mg/dL Final   Creatinine, Ser  Date Value Ref Range Status  06/10/2023 0.98 0.61 - 1.24 mg/dL Final   BUN  Date Value Ref Range Status  06/10/2023 12 8 - 23 mg/dL Final  56/21/3086 13 8 - 27 mg/dL Final  57/84/6962 11 7 - 18 mg/dL Final   Potassium  Date Value Ref Range Status  06/10/2023 3.8 3.5 - 5.1 mmol/L Final  12/07/2012 3.3 (L) 3.5 - 5.1 mmol/L Final   Sodium  Date Value Ref Range Status  06/10/2023 142 135 - 145 mmol/L Final  06/03/2023 143 134 - 144 mmol/L Final  12/07/2012 139 136 - 145 mmol/L Final   BNP  Date Value Ref Range Status  06/03/2023 63.8 0.0 - 100.0 pg/mL Final    Comment:    [employer] ADVIA Centaur XP methodology   Magnesium  Date Value Ref Range Status  12/21/2022 2.0 1.7 - 2.4 mg/dL Final    Comment:    Performed at [facility], [address] [place], [place] [address]   TSH  Date Value Ref Range Status  04/27/2023 2.01 0.40 - 4.50 mIU/L Final    Vital Signs: There were no vitals filed for this visit.   Assessment/Plan: 1. Chronic systolic CHF: Ischemic cardiomyopathy. Most recent echo in 3/25 showed EF 30-35%, anteroseptal akinesis, RV normal, mild MR.  NYHA class II symptoms, he is not volume overloaded on exam.  - Continue  Coreg to 6.25 mg bid. Will not titrate today given HR of 60 bpm. - Continue Jardiance 25 mg daily.  - Increase Entresto to 49-51 mg bid. BP today up from last visit and patient has no symptoms of hypotension. - Continue spironolactone 12.5 mg daily. BMET today showed potassium WNL and stable creatinine. Can consider increasing next visit if K is stable. - Patient has been referred to EP to consider ICD placement given persistently low EF post-MI.  He  would not be a CRT candidate with narrow QRS.   2. CAD: Anterior MI in 12/2022, cath with occluded proximal LAD treated with DES, D1 and D2 jailed by stent, residual 60% distal LAD. No chest pain.  - Continue ASA 81 and ticagrelor 90 bid.  Consider transition to single agent clopidogrel at 1 year.  - Continue atorvastatin 80 mg daily and ezetemibe 10 mg daily.   3. Hyperlipidemia: Goal LDL < 55.  - Continue atorvastatin 80 mg daily.  - Contine Zetia 10 mg daily.  Lipids in 2 months.   4. PVCs: Frequent PVCs noted previously - Quantify with 7 day Zio monitor - unable to see results.  - Increasing Coreg as above.   Follow up: In 1 week with pharmacy and 3 weeks with Dr. Mitzie Anda  Please do not hesitate to reach out with questions or concerns,  Bevely Brush, PharmD, CPP, BCPS Heart Failure Pharmacist  Phone - [phone] 06/16/2023 12:58 PM

## 2023-06-17 ENCOUNTER — Ambulatory Visit: Attending: Family | Admitting: Pharmacist

## 2023-06-17 VITALS — BP 102/82 | HR 66 | Wt 166.4 lb

## 2023-06-17 DIAGNOSIS — Z7984 Long term (current) use of oral hypoglycemic drugs: Secondary | ICD-10-CM | POA: Diagnosis not present

## 2023-06-17 DIAGNOSIS — I2510 Atherosclerotic heart disease of native coronary artery without angina pectoris: Secondary | ICD-10-CM | POA: Diagnosis not present

## 2023-06-17 DIAGNOSIS — I110 Hypertensive heart disease with heart failure: Secondary | ICD-10-CM | POA: Diagnosis not present

## 2023-06-17 DIAGNOSIS — I5020 Unspecified systolic (congestive) heart failure: Secondary | ICD-10-CM | POA: Diagnosis not present

## 2023-06-17 DIAGNOSIS — E785 Hyperlipidemia, unspecified: Secondary | ICD-10-CM | POA: Insufficient documentation

## 2023-06-17 DIAGNOSIS — E119 Type 2 diabetes mellitus without complications: Secondary | ICD-10-CM | POA: Insufficient documentation

## 2023-06-17 DIAGNOSIS — I5022 Chronic systolic (congestive) heart failure: Secondary | ICD-10-CM | POA: Insufficient documentation

## 2023-06-17 DIAGNOSIS — I255 Ischemic cardiomyopathy: Secondary | ICD-10-CM | POA: Diagnosis not present

## 2023-06-17 DIAGNOSIS — Z7982 Long term (current) use of aspirin: Secondary | ICD-10-CM | POA: Insufficient documentation

## 2023-06-17 DIAGNOSIS — I252 Old myocardial infarction: Secondary | ICD-10-CM | POA: Diagnosis not present

## 2023-06-17 DIAGNOSIS — Z79899 Other long term (current) drug therapy: Secondary | ICD-10-CM | POA: Diagnosis not present

## 2023-06-17 NOTE — Patient Instructions (Signed)
 It was a pleasure seeing you today!  MEDICATIONS: =Ningun cambio de los medicamentos por ahora. Le hablaremos si necesita hacer algun cambio basado en las pruebas de laboratorio.  LABS: -Le llamaremos si las pruebas de laboratorio requieren [place] accion.

## 2023-06-18 LAB — BASIC METABOLIC PANEL WITH GFR
BUN/Creatinine Ratio: 17 (ref 10–24)
BUN: 15 mg/dL (ref 8–27)
CO2: 23 mmol/L (ref 20–29)
Calcium: 9.6 mg/dL (ref 8.6–10.2)
Chloride: 104 mmol/L (ref 96–106)
Creatinine, Ser: 0.86 mg/dL (ref 0.76–1.27)
Glucose: 130 mg/dL — ABNORMAL HIGH (ref 70–99)
Potassium: 4.6 mmol/L (ref 3.5–5.2)
Sodium: 140 mmol/L (ref 134–144)
eGFR: 92 mL/min/{1.73_m2} (ref >59)

## 2023-06-23 ENCOUNTER — Other Ambulatory Visit: Payer: Self-pay

## 2023-06-23 ENCOUNTER — Encounter: Payer: Self-pay | Admitting: Cardiology

## 2023-06-23 ENCOUNTER — Ambulatory Visit: Attending: Cardiology | Admitting: Cardiology

## 2023-06-23 VITALS — BP 102/60 | HR 57 | Ht 66.0 in | Wt 166.2 lb

## 2023-06-23 DIAGNOSIS — I5022 Chronic systolic (congestive) heart failure: Secondary | ICD-10-CM | POA: Diagnosis not present

## 2023-06-23 DIAGNOSIS — I493 Ventricular premature depolarization: Secondary | ICD-10-CM | POA: Diagnosis not present

## 2023-06-23 DIAGNOSIS — I2510 Atherosclerotic heart disease of native coronary artery without angina pectoris: Secondary | ICD-10-CM | POA: Diagnosis not present

## 2023-06-23 DIAGNOSIS — E785 Hyperlipidemia, unspecified: Secondary | ICD-10-CM

## 2023-06-23 DIAGNOSIS — I255 Ischemic cardiomyopathy: Secondary | ICD-10-CM | POA: Diagnosis not present

## 2023-06-23 NOTE — Progress Notes (Signed)
 Electrophysiology Office Note:   Date:  06/23/2023  ID:  Ross Riley, DOB 1950/03/22, MRN [id number]  Primary Cardiologist: Belva Boyden, MD Electrophysiologist: Ardeen Kohler, MD      History of Present Illness:   Ross Riley is a 73 y.o. male with h/o CAD, chronic systolic heart failure secondary to ischemic cardiomyopathy, type II diabetes, hyperlipidemia and HTN who is being seen today for evaluation for ICD implant at the request of Dr. Mitzie Anda.  Discussed the use of AI scribe software for clinical note transcription with the patient, who gave verbal consent to proceed.  History of Present Illness Patient was admitted in 10/24 with anterior MI. Cath showed occluded proximal LAD treated with DES, D1 and D2 jailed by stent, residual 60% distal LAD. Echo in 10/24 showed EF 35-40%, normal RV. Repeat echo in 2/25 showed EF 30-35%, anteroseptal akinesis, RV normal, mild MR. He has not experienced any life-threatening arrhythmias to date. He reports that during a previous ECG his heart rate was noted to be 35 beats per minute He experiences occasional extra heartbeats, but his rhythm is mostly normal. He is doing relatively well. No current chest pain, worsening shortness of breath or edema.   Review of systems complete and found to be negative unless listed in HPI.   Video interpreting service used.   EP Information / Studies Reviewed:    EKG is not ordered today. EKG from 06/03/23 reviewed which showed sinus rhythm with frequent PVCs. PR and QRS 84ms.      Echo 05/29/23:  1. Left ventricular ejection fraction, by estimation, is 30 to 35%. The  left ventricle has moderate to severely decreased function. The left  ventricle demonstrates regional wall motion abnormalities (see scoring  diagram/findings for description). There  is akinesis of the left ventricular, entire anteroseptal wall. The average  left ventricular global longitudinal strain is -14.6 %. The global   longitudinal strain is abnormal.   2. Right ventricular systolic function is low normal. The right  ventricular size is normal.   3. The mitral valve is normal in structure. Mild mitral valve  regurgitation.   4. Aortic valve regurgitation is mild to moderate.   LHC 12/20/22:   Prox LAD to Mid LAD lesion is 100% stenosed.   Dist LAD lesion is 60% stenosed.   A drug-eluting stent was successfully placed using a STENT ONYX FRONTIER 3.5X34.   Post intervention, there is a 0% residual stenosis.   There is mild to moderate left ventricular systolic dysfunction.   LV end diastolic pressure is normal.   The left ventricular ejection fraction is 35-45% by visual estimate.   1.  Acute anterior ST elevation myocardial infarction due to thrombotic occlusion of the proximal/mid LAD.  Left dominant coronary arteries with no other obstructive disease other than 60% stenosis in the distal LAD. 2.  Mildly to moderately reduced LV systolic function with low normal LVEDP at 8 mmHg. 3.  Successful angioplasty and drug-eluting stent placement to the LAD.  Both first and second diagonals were jailed by the stent and there was sluggish flow in second diagonal but the branch was too small to rescue.  Physical Exam:   VS:  BP 102/60   Pulse (!) 57   Ht 5\' 6"  (1.676 m)   Wt 166 lb 3.2 oz (75.4 kg)   SpO2 95%   BMI 26.83 kg/m    Wt Readings from Last 3 Encounters:  06/23/23 166 lb 3.2 oz (75.4 kg)  06/17/23  166 lb 6.4 oz (75.5 kg)  06/10/23 168 lb 3.2 oz (76.3 kg)     GEN: Well nourished, well developed in no acute distress NECK: No JVD CARDIAC: Bradycardic, regular RESPIRATORY:  Clear to auscultation without rales, wheezing or rhonchi  ABDOMEN: Soft, non-distended EXTREMITIES:  No edema; No deformity   ASSESSMENT AND PLAN:    #. Chronic systolic heart failure: EF 30-35%. NYHA class II symptoms. #. Ischemic cardiomyopathy:  - LVEF remains less than 35% despite greater than 90 days of optimal  medical therapy.  He is greater than 90 days from revascularization.  He meets criteria for primary prevention ICD. He has some resting sinus bradycardia and may need more beta blockade or amiodarone for PVC suppression, so will implant a dual chamber device. Explained risks, benefits, and alternatives to ICD implantation, including but not limited to bleeding, infection, damage to heart or lungs, heart attack, stroke, or death.  Pt verbalized understanding and agrees to proceed. - Continue GDMT regimen of carvedilol  6.25 twice daily, empagliflozin  10 mg daily, Entresto  49-51 mg twice daily, spironolactone  12.5 mg once daily.  Close follow-up with our heart failure colleagues, Dr. Mitzie Anda.  #. PVCs: Frequent PVCs seen on last ECG.  Not recorded on prior ECGs. Sounds regular today.  -Continue carvedilol  as above. -Will monitor burden with device.  #. CAD: S/p prox LAD PCI.  - Continue aspirin  81 mg once daily and ticagrelor  90mg  BID.  - Imdur  15 mg once daily.  #. Hyperlipidemia:  - Continue atorvastatin  80 mg once daily, continue ezetimibe  10 mg once daily.  Follow up with Dr. Daneil Dunker  3 months after ICD implant.   Signed, Ardeen Kohler, MD

## 2023-06-23 NOTE — Patient Instructions (Signed)
 Medication Instructions:  Your physician recommends that you continue on your current medications as directed. Please refer to the Current Medication list given to you today.  *If you need a refill on your cardiac medications before your next appointment, please call your pharmacy*  Lab Work: TODAY: BMET and CBC  Testing/Procedures: ICD Implant Your physician has recommended that you have a defibrillator inserted. An implantable cardioverter defibrillator (ICD) is a small device that is placed in your chest or, in rare cases, your abdomen. This device uses electrical pulses or shocks to help control life-threatening, irregular heartbeats that could lead the heart to suddenly stop beating (sudden cardiac arrest). Leads are attached to the ICD that goes into your heart. This is done in the hospital and usually requires an overnight stay. Please see the instruction sheet given to you today for more information.  Su mdico le ha recomendado la insercin de [employer]. Un desfibrilador cardioversor implantable (DCI) es un pequeo dispositivo que se coloca en el pecho o, en casos excepcionales, en el abdomen. Este dispositivo utiliza pulsos elctricos o descargas para ayudar a Chief Operating Officer latidos cardacos irregulares potencialmente mortales que podran provocar un paro cardaco repentino. Se conectan cables al DCI que se introduce en el corazn. Esto se Clinical cytogeneticist hospital y suele requerir hospitalizacin. Para ms informacin, consulte la hoja de instrucciones que le entregamos hoy.  Follow-Up: At [facility], you and your health needs are our priority.  As part of our continuing mission to provide you with exceptional heart care, our providers are all part of one team.  This team includes your primary Cardiologist (physician) and Advanced Practice Providers or APPs (Physician Assistants and Nurse Practitioners) who all work together to provide you with the care you need, when you need  it.  Your next appointment:   We will call you to schedule your follow up appointments

## 2023-06-23 NOTE — H&P (View-Only) (Signed)
 Electrophysiology Office Note:   Date:  06/23/2023  ID:  Ross Riley, DOB 1950/03/22, MRN [id number]  Primary Cardiologist: Belva Boyden, MD Electrophysiologist: Ardeen Kohler, MD      History of Present Illness:   Ross Riley is a 73 y.o. male with h/o CAD, chronic systolic heart failure secondary to ischemic cardiomyopathy, type II diabetes, hyperlipidemia and HTN who is being seen today for evaluation for ICD implant at the request of Dr. Mitzie Anda.  Discussed the use of AI scribe software for clinical note transcription with the patient, who gave verbal consent to proceed.  History of Present Illness Patient was admitted in 10/24 with anterior MI. Cath showed occluded proximal LAD treated with DES, D1 and D2 jailed by stent, residual 60% distal LAD. Echo in 10/24 showed EF 35-40%, normal RV. Repeat echo in 2/25 showed EF 30-35%, anteroseptal akinesis, RV normal, mild MR. He has not experienced any life-threatening arrhythmias to date. He reports that during a previous ECG his heart rate was noted to be 35 beats per minute He experiences occasional extra heartbeats, but his rhythm is mostly normal. He is doing relatively well. No current chest pain, worsening shortness of breath or edema.   Review of systems complete and found to be negative unless listed in HPI.   Video interpreting service used.   EP Information / Studies Reviewed:    EKG is not ordered today. EKG from 06/03/23 reviewed which showed sinus rhythm with frequent PVCs. PR and QRS 84ms.      Echo 05/29/23:  1. Left ventricular ejection fraction, by estimation, is 30 to 35%. The  left ventricle has moderate to severely decreased function. The left  ventricle demonstrates regional wall motion abnormalities (see scoring  diagram/findings for description). There  is akinesis of the left ventricular, entire anteroseptal wall. The average  left ventricular global longitudinal strain is -14.6 %. The global   longitudinal strain is abnormal.   2. Right ventricular systolic function is low normal. The right  ventricular size is normal.   3. The mitral valve is normal in structure. Mild mitral valve  regurgitation.   4. Aortic valve regurgitation is mild to moderate.   LHC 12/20/22:   Prox LAD to Mid LAD lesion is 100% stenosed.   Dist LAD lesion is 60% stenosed.   A drug-eluting stent was successfully placed using a STENT ONYX FRONTIER 3.5X34.   Post intervention, there is a 0% residual stenosis.   There is mild to moderate left ventricular systolic dysfunction.   LV end diastolic pressure is normal.   The left ventricular ejection fraction is 35-45% by visual estimate.   1.  Acute anterior ST elevation myocardial infarction due to thrombotic occlusion of the proximal/mid LAD.  Left dominant coronary arteries with no other obstructive disease other than 60% stenosis in the distal LAD. 2.  Mildly to moderately reduced LV systolic function with low normal LVEDP at 8 mmHg. 3.  Successful angioplasty and drug-eluting stent placement to the LAD.  Both first and second diagonals were jailed by the stent and there was sluggish flow in second diagonal but the branch was too small to rescue.  Physical Exam:   VS:  BP 102/60   Pulse (!) 57   Ht 5\' 6"  (1.676 m)   Wt 166 lb 3.2 oz (75.4 kg)   SpO2 95%   BMI 26.83 kg/m    Wt Readings from Last 3 Encounters:  06/23/23 166 lb 3.2 oz (75.4 kg)  06/17/23  166 lb 6.4 oz (75.5 kg)  06/10/23 168 lb 3.2 oz (76.3 kg)     GEN: Well nourished, well developed in no acute distress NECK: No JVD CARDIAC: Bradycardic, regular RESPIRATORY:  Clear to auscultation without rales, wheezing or rhonchi  ABDOMEN: Soft, non-distended EXTREMITIES:  No edema; No deformity   ASSESSMENT AND PLAN:    #. Chronic systolic heart failure: EF 30-35%. NYHA class II symptoms. #. Ischemic cardiomyopathy:  - LVEF remains less than 35% despite greater than 90 days of optimal  medical therapy.  He is greater than 90 days from revascularization.  He meets criteria for primary prevention ICD. He has some resting sinus bradycardia and may need more beta blockade or amiodarone for PVC suppression, so will implant a dual chamber device. Explained risks, benefits, and alternatives to ICD implantation, including but not limited to bleeding, infection, damage to heart or lungs, heart attack, stroke, or death.  Pt verbalized understanding and agrees to proceed. - Continue GDMT regimen of carvedilol  6.25 twice daily, empagliflozin  10 mg daily, Entresto  49-51 mg twice daily, spironolactone  12.5 mg once daily.  Close follow-up with our heart failure colleagues, Dr. Mitzie Anda.  #. PVCs: Frequent PVCs seen on last ECG.  Not recorded on prior ECGs. Sounds regular today.  -Continue carvedilol  as above. -Will monitor burden with device.  #. CAD: S/p prox LAD PCI.  - Continue aspirin  81 mg once daily and ticagrelor  90mg  BID.  - Imdur  15 mg once daily.  #. Hyperlipidemia:  - Continue atorvastatin  80 mg once daily, continue ezetimibe  10 mg once daily.  Follow up with Dr. Daneil Dunker  3 months after ICD implant.   Signed, Ardeen Kohler, MD

## 2023-06-24 LAB — CBC WITH DIFFERENTIAL/PLATELET
Basophils Absolute: 0 10*3/uL (ref 0.0–0.2)
Basos: 1 %
EOS (ABSOLUTE): 0.2 10*3/uL (ref 0.0–0.4)
Eos: 3 %
Hematocrit: 48.2 % (ref 37.5–51.0)
Hemoglobin: 15.3 g/dL (ref 13.0–17.7)
Immature Grans (Abs): 0 10*3/uL (ref 0.0–0.1)
Immature Granulocytes: 0 %
Lymphocytes Absolute: 2.9 10*3/uL (ref 0.7–3.1)
Lymphs: 53 %
MCH: 28.6 pg (ref 26.6–33.0)
MCHC: 31.7 g/dL (ref 31.5–35.7)
MCV: 90 fL (ref 79–97)
Monocytes Absolute: 0.4 10*3/uL (ref 0.1–0.9)
Monocytes: 8 %
Neutrophils Absolute: 1.9 10*3/uL (ref 1.4–7.0)
Neutrophils: 35 %
Platelets: 227 10*3/uL (ref 150–450)
RBC: 5.35 x10E6/uL (ref 4.14–5.80)
RDW: 12.7 % (ref 11.6–15.4)
WBC: 5.4 10*3/uL (ref 3.4–10.8)

## 2023-06-24 LAB — BASIC METABOLIC PANEL WITH GFR
BUN/Creatinine Ratio: 15 (ref 10–24)
BUN: 15 mg/dL (ref 8–27)
CO2: 22 mmol/L (ref 20–29)
Calcium: 9.3 mg/dL (ref 8.6–10.2)
Chloride: 107 mmol/L — ABNORMAL HIGH (ref 96–106)
Creatinine, Ser: 0.97 mg/dL (ref 0.76–1.27)
Glucose: 150 mg/dL — ABNORMAL HIGH (ref 70–99)
Potassium: 4.7 mmol/L (ref 3.5–5.2)
Sodium: 143 mmol/L (ref 134–144)
eGFR: 83 mL/min/{1.73_m2} (ref >59)

## 2023-06-29 ENCOUNTER — Telehealth (HOSPITAL_COMMUNITY): Payer: Self-pay

## 2023-06-29 NOTE — Telephone Encounter (Signed)
 Call placed to patient's daughter May Sparks to discuss upcoming procedure/DPR on file.   Confirmed patient is scheduled for a Implantable cardioverter defibrilator (ICD) on Monday, May 5 with Dr. Clinton Danas. Instructed patient to arrive at the Main Entrance A at [facility]: [address] [place], [place] [address] and check in at Admitting at 12:30 PM.   Labs completed  Any recent signs of acute illness or been started on antibiotics?  No Any new medications started? No Any medications to hold? Hold Jardiance  3 days and Spironolactone  the morning of procedure. Medication instructions:  On the morning of your procedure take all other medications not discussed with small sip of water. No eating or drinking after midnight prior to procedure.   The night before your procedure and the morning of your procedure, wash thoroughly with the CHG surgical soap from the neck down, paying special attention to the area where your procedure will be performed.  Advised of plan to go home the same day and will only stay overnight if medically necessary. You MUST have a responsible adult to drive you home and MUST be with you the first 24 hours after you arrive home.  [name] verbalized understanding to all instructions provided and agreed to proceed with procedure.

## 2023-07-01 ENCOUNTER — Ambulatory Visit: Attending: Cardiology | Admitting: Cardiology

## 2023-07-01 ENCOUNTER — Encounter: Payer: Self-pay | Admitting: Cardiology

## 2023-07-01 VITALS — BP 107/58 | HR 55 | Wt 166.6 lb

## 2023-07-01 DIAGNOSIS — Z955 Presence of coronary angioplasty implant and graft: Secondary | ICD-10-CM | POA: Diagnosis not present

## 2023-07-01 DIAGNOSIS — E785 Hyperlipidemia, unspecified: Secondary | ICD-10-CM | POA: Diagnosis not present

## 2023-07-01 DIAGNOSIS — Z7984 Long term (current) use of oral hypoglycemic drugs: Secondary | ICD-10-CM | POA: Insufficient documentation

## 2023-07-01 DIAGNOSIS — Z7982 Long term (current) use of aspirin: Secondary | ICD-10-CM | POA: Diagnosis not present

## 2023-07-01 DIAGNOSIS — Z7902 Long term (current) use of antithrombotics/antiplatelets: Secondary | ICD-10-CM | POA: Diagnosis not present

## 2023-07-01 DIAGNOSIS — E119 Type 2 diabetes mellitus without complications: Secondary | ICD-10-CM | POA: Diagnosis not present

## 2023-07-01 DIAGNOSIS — I5022 Chronic systolic (congestive) heart failure: Secondary | ICD-10-CM | POA: Insufficient documentation

## 2023-07-01 DIAGNOSIS — I2510 Atherosclerotic heart disease of native coronary artery without angina pectoris: Secondary | ICD-10-CM | POA: Diagnosis not present

## 2023-07-01 DIAGNOSIS — Z8249 Family history of ischemic heart disease and other diseases of the circulatory system: Secondary | ICD-10-CM | POA: Insufficient documentation

## 2023-07-01 DIAGNOSIS — I255 Ischemic cardiomyopathy: Secondary | ICD-10-CM | POA: Diagnosis not present

## 2023-07-01 DIAGNOSIS — I493 Ventricular premature depolarization: Secondary | ICD-10-CM | POA: Diagnosis not present

## 2023-07-01 DIAGNOSIS — Z603 Acculturation difficulty: Secondary | ICD-10-CM | POA: Diagnosis not present

## 2023-07-01 DIAGNOSIS — R001 Bradycardia, unspecified: Secondary | ICD-10-CM | POA: Insufficient documentation

## 2023-07-01 DIAGNOSIS — Z79899 Other long term (current) drug therapy: Secondary | ICD-10-CM | POA: Diagnosis not present

## 2023-07-01 DIAGNOSIS — I110 Hypertensive heart disease with heart failure: Secondary | ICD-10-CM | POA: Diagnosis not present

## 2023-07-01 DIAGNOSIS — I252 Old myocardial infarction: Secondary | ICD-10-CM | POA: Insufficient documentation

## 2023-07-01 MED ORDER — SPIRONOLACTONE 25 MG PO TABS: 25.0000 mg | ORAL_TABLET | Freq: Every day | ORAL | 3 refills | Status: DC

## 2023-07-01 NOTE — Progress Notes (Signed)
 PCP: Raina Bunting, DO Cardiology: Dr. Jerelene Monday HF Cardiology: Dr. Mitzie Anda  Chief complaint: CHF  73 y.o. with history of CAD, ischemic cardiomyopathy, diabetes, and HTN was referred by Dr. Gollan for evaluation of CHF. Patient was admitted in 10/24 with anterior MI. Cath showed occluded proximal LAD treated with DES, D1 and D2 jailed by stent, residual 60% distal LAD.  Echo in 10/24 showed EF 35-40%, normal RV.  Repeat echo in 3/25 showed EF 30-35%, anteroseptal akinesis, RV normal, mild MR.    Patient returns for followup of CHF and CAD.  Spanish interpreter was used.  Symptomatically stable.  Weight down 2 lbs. He has rare atypical chest pain.  Occasional lightheadedness that sounds more like vertigo (spinning sensation).  No dyspnea walking on flat ground, gets tired after climbing a flight of stairs. No palpitations currently.  No orthopnea/PND.    ECG (personally reviewed): NSR, old anterior MI  Labs (2/25): LDL 69, K 4.5, creatinine 5.40, hgb 15.6  Labs (4/25): K 4.7, creatinine 0.97, BNP 64  PMH: 1. Type 2 diabetes 2. HTN 3. Hyperlipidemia 4. CAD: Anterior MI in 10/24, cath with occluded proximal LAD treated with DES, D1 and D2 jailed by stent, residual 60% distal LAD.  5. Chronic systolic CHF: Ischemic cardiomyopathy.  - Echo (10/24): EF 35-40%, normal RV - Echo (3/25): EF 30-35%, anteroseptal akinesis, RV normal, mild MR.   SH: Retired, prior smoker but has quit. Prior heavy ETOH. Lives in [place] with wife. Spanish-speaking.   Family History  Problem Relation Age of Onset   Heart disease Mother    Hypertension Brother    ROS: All systems reviewed and negative except as per HPI.   Current Outpatient Medications  Medication Sig Dispense Refill   ACCU-CHEK AVIVA PLUS test strip Use to check blood sugar up to 1 x per day 100 each 12   Accu-Chek Softclix Lancets lancets Use to check blood sugar up to 1 x per day 100 each 12   aspirin  EC 81 MG tablet Take 81 mg by  mouth daily. Swallow whole.     atorvastatin  (LIPITOR ) 80 MG tablet Take 1 tablet (80 mg total) by mouth at bedtime. 90 tablet 3   Blood Glucose Monitoring Suppl (ACCU-CHEK AVIVA PLUS) w/Device KIT Use to check blood sugar up to 1 x per day 1 kit 0   carvedilol  (COREG ) 6.25 MG tablet Take 1 tablet (6.25 mg total) by mouth 2 (two) times daily with a meal. 60 tablet 3   dorzolamide -timolol  (COSOPT ) 22.3-6.8 MG/ML ophthalmic solution Place 1 drop into both eyes 2 (two) times daily.     empagliflozin  (JARDIANCE ) 10 MG TABS tablet Take 1 tablet (10 mg total) by mouth daily. (Patient taking differently: Take 25 mg by mouth daily.) 30 tablet 2   ezetimibe  (ZETIA ) 10 MG tablet Take 1 tablet (10 mg total) by mouth daily. 90 tablet 2   finasteride  (PROSCAR ) 5 MG tablet Take 1 tablet (5 mg total) by mouth daily. 90 tablet 3   folic acid  (FOLVITE ) 1 MG tablet Take 1 tablet (1 mg total) by mouth daily. 30 tablet 2   gabapentin  (NEURONTIN ) 600 MG tablet Take 1 tablet (600 mg total) by mouth 3 (three) times daily. 270 tablet 3   isosorbide  mononitrate (IMDUR ) 30 MG 24 hr tablet TAKE 1/2 TABLET(15 MG) BY MOUTH DAILY 45 tablet 3   latanoprost  (XALATAN ) 0.005 % ophthalmic solution Place 1 drop into both eyes at bedtime.  6   levothyroxine  (SYNTHROID ) 75 MCG  tablet Take 75 mcg by mouth daily before breakfast.     Multiple Vitamin (MULTIVITAMIN WITH MINERALS) TABS tablet Take 1 tablet by mouth daily. 30 tablet 2   sacubitril -valsartan  (ENTRESTO ) 49-51 MG Take 1 tablet by mouth 2 (two) times daily. 60 tablet 2   ticagrelor  (BRILINTA ) 90 MG TABS tablet TAKE 1 TABLET(90 MG) BY MOUTH TWICE DAILY 60 tablet 11   nitroGLYCERIN  (NITROSTAT ) 0.4 MG SL tablet Place 1 tablet (0.4 mg total) under the tongue every 5 (five) minutes as needed for chest pain. 25 tablet 2   spironolactone  (ALDACTONE ) 25 MG tablet Take 1 tablet (25 mg total) by mouth daily. 90 tablet 3   No current facility-administered medications for this visit.    BP (!) 107/58   Pulse (!) 55   Wt 166 lb 9.6 oz (75.6 kg)   SpO2 99%   BMI 26.89 kg/m  General: NAD Neck: No JVD, no thyromegaly or thyroid  nodule.  Lungs: Clear to auscultation bilaterally with normal respiratory effort. CV: Nondisplaced PMI.  Heart regular S1/S2, no S3/S4, no murmur.  No peripheral edema.  No carotid bruit.  Normal pedal pulses.  Abdomen: Soft, nontender, no hepatosplenomegaly, no distention.  Skin: Intact without lesions or rashes.  Neurologic: Alert and oriented x 3.  Psych: Normal affect. Extremities: No clubbing or cyanosis.  HEENT: Normal.   Assessment/Plan: 1. Chronic systolic CHF: Ischemic cardiomyopathy. Most recent echo in 3/25 showed EF 30-35%, anteroseptal akinesis, RV normal, mild MR.  NYHA class II symptoms, not volume overloaded on exam.  - Continue Coreg  6.25 mg bid. - Continue Jardiance  10 mg daily.  - Continue Entresto  24/26 bid.  - Increase spironolactone  to 25 mg daily.  BMET/BNP today and in 1 week.  - He has seen EP and will get ICD next Monday.  He is not a CRT candidate with narrow QRS.  2. CAD: Anterior MI in 10/24, cath with occluded proximal LAD treated with DES, D1 and D2 jailed by stent, residual 60% distal LAD. Rare atypical chest pain.  - Continue ASA 81 and ticagrelor  90 bid.  Consider transition to single agent clopidogrel at 1 year.  - Continue atorvastatin  80 mg daily.  3. Hyperlipidemia: Goal LDL < 55.  - Continue atorvastatin  80 mg daily.  - Continue Zetia  - Check lipids today.  4. PVCs: Frequent PVCs noted in the past, was not able to get Zio monitor as none available last appointment.  - Place Zio monitor today.  - Continue Coreg .   Followup with me in 2 months.    I spent 31 minutes reviewing records, interviewing/examining patient, and managing orders.   Peder Bourdon 07/01/2023

## 2023-07-01 NOTE — Patient Instructions (Signed)
 Medication Changes:  INCREASE Spironolactone  25mg  (1 tab) daily  Lab Work:  Go DOWN to LOWER LEVEL (LL) to have your blood work completed inside of [employer] office TODAY.  Go over to the MEDICAL MALL. Go pass the gift shop and have your blood work completed IN TWO WEEKS.  We will only call you if the results are abnormal or if the provider would like to make medication changes.   Testing/Procedures:  Your provider has recommended that  you wear a Zio Patch for 2 days.  This monitor will record your heart rhythm for our review.  IF you have any symptoms while wearing the monitor please press the button.  If you have any issues with the patch or you notice a red or orange light on it please call the company at [phone].  Once you remove the patch please mail it back to the company as soon as possible so we can get the results.    Follow-Up in: Please follow up with [facility] in 3 months with Dr. Mitzie Anda. We currently do not have that schedule. Please call back in June in order to schedule your appointment for July.  At the [facility], you and your health needs are our priority. We have a designated team specialized in the treatment of Heart Failure. This Care Team includes your primary Heart Failure Specialized Cardiologist (physician), Advanced Practice Providers (APPs- Physician Assistants and Nurse Practitioners), and Pharmacist who all work together to provide you with the care you need, when you need it.   You may see any of the following providers on your designated Care Team at your next follow up:  Dr. Jules Oar Dr. Peder Bourdon Dr. Alwin Baars Dr. Judyth Nunnery Shawnee Dellen, FNP Bevely Brush, RPH-CPP  Please be sure to bring in all your medications bottles to every appointment.   Need to Contact Us :  If you have any questions or concerns before your next appointment please send us  a message through [facility] or call our  office at [phone].    TO LEAVE A MESSAGE FOR THE NURSE SELECT OPTION 2, PLEASE LEAVE A MESSAGE INCLUDING: YOUR NAME DATE OF BIRTH CALL BACK NUMBER REASON FOR CALL**this is important as we prioritize the call backs  YOU WILL RECEIVE A CALL BACK THE SAME DAY AS LONG AS YOU CALL BEFORE 4:00 PM

## 2023-07-02 LAB — LIPID PANEL
Chol/HDL Ratio: 2.5 ratio (ref 0.0–5.0)
Cholesterol, Total: 117 mg/dL (ref 100–199)
HDL: 46 mg/dL
LDL Chol Calc (NIH): 45 mg/dL (ref 0–99)
Triglycerides: 157 mg/dL — ABNORMAL HIGH (ref 0–149)
VLDL Cholesterol Cal: 26 mg/dL (ref 5–40)

## 2023-07-02 LAB — BASIC METABOLIC PANEL WITH GFR
BUN/Creatinine Ratio: 18 (ref 10–24)
BUN: 19 mg/dL (ref 8–27)
CO2: 20 mmol/L (ref 20–29)
Calcium: 9.8 mg/dL (ref 8.6–10.2)
Chloride: 106 mmol/L (ref 96–106)
Creatinine, Ser: 1.08 mg/dL (ref 0.76–1.27)
Glucose: 143 mg/dL — ABNORMAL HIGH (ref 70–99)
Potassium: 5.1 mmol/L (ref 3.5–5.2)
Sodium: 144 mmol/L (ref 134–144)
eGFR: 73 mL/min/{1.73_m2} (ref >59)

## 2023-07-03 ENCOUNTER — Other Ambulatory Visit (HOSPITAL_COMMUNITY): Payer: Self-pay | Admitting: Cardiology

## 2023-07-03 ENCOUNTER — Inpatient Hospital Stay (HOSPITAL_COMMUNITY)
Admission: RE | Admit: 2023-07-03 | Discharge: 2023-07-03 | Disposition: A | Discharge status: Home / Self Care | Source: Ambulatory Visit | Attending: Cardiology | Admitting: Cardiology

## 2023-07-03 DIAGNOSIS — R002 Palpitations: Secondary | ICD-10-CM

## 2023-07-03 NOTE — Pre-Procedure Instructions (Signed)
 Spoke with daughter May Sparks regarding instructions for Monday.   Arrival time 1330 Nothing to eat or drink after midnight No meds AM of procedure Responsible person to drive you home and stay with you for 24 hrs Wash with special soap night before and morning of procedure

## 2023-07-06 ENCOUNTER — Encounter (HOSPITAL_COMMUNITY)
Admission: RE | Disposition: A | Discharge status: Home / Self Care | Payer: Self-pay | Source: Home / Self Care | Attending: Cardiology

## 2023-07-06 ENCOUNTER — Ambulatory Visit (HOSPITAL_COMMUNITY)
Admission: RE | Admit: 2023-07-06 | Discharge: 2023-07-07 | Disposition: A | Discharge status: Home / Self Care | Attending: Cardiology | Admitting: Cardiology

## 2023-07-06 ENCOUNTER — Other Ambulatory Visit: Payer: Self-pay

## 2023-07-06 ENCOUNTER — Encounter (HOSPITAL_COMMUNITY): Payer: Self-pay | Admitting: Cardiology

## 2023-07-06 DIAGNOSIS — Z7902 Long term (current) use of antithrombotics/antiplatelets: Secondary | ICD-10-CM | POA: Insufficient documentation

## 2023-07-06 DIAGNOSIS — E119 Type 2 diabetes mellitus without complications: Secondary | ICD-10-CM | POA: Diagnosis not present

## 2023-07-06 DIAGNOSIS — Z7984 Long term (current) use of oral hypoglycemic drugs: Secondary | ICD-10-CM | POA: Insufficient documentation

## 2023-07-06 DIAGNOSIS — I2510 Atherosclerotic heart disease of native coronary artery without angina pectoris: Secondary | ICD-10-CM | POA: Diagnosis not present

## 2023-07-06 DIAGNOSIS — Z79899 Other long term (current) drug therapy: Secondary | ICD-10-CM | POA: Diagnosis not present

## 2023-07-06 DIAGNOSIS — Z955 Presence of coronary angioplasty implant and graft: Secondary | ICD-10-CM | POA: Insufficient documentation

## 2023-07-06 DIAGNOSIS — I5022 Chronic systolic (congestive) heart failure: Secondary | ICD-10-CM

## 2023-07-06 DIAGNOSIS — I493 Ventricular premature depolarization: Secondary | ICD-10-CM | POA: Insufficient documentation

## 2023-07-06 DIAGNOSIS — I110 Hypertensive heart disease with heart failure: Secondary | ICD-10-CM | POA: Diagnosis not present

## 2023-07-06 DIAGNOSIS — E785 Hyperlipidemia, unspecified: Secondary | ICD-10-CM | POA: Diagnosis not present

## 2023-07-06 DIAGNOSIS — Z7982 Long term (current) use of aspirin: Secondary | ICD-10-CM | POA: Diagnosis not present

## 2023-07-06 DIAGNOSIS — I255 Ischemic cardiomyopathy: Secondary | ICD-10-CM | POA: Diagnosis not present

## 2023-07-06 HISTORY — PX: ICD IMPLANT: EP1208

## 2023-07-06 SURGERY — ICD IMPLANT
Anesthesia: LOCAL

## 2023-07-06 MED ORDER — DORZOLAMIDE HCL-TIMOLOL MAL 2-0.5 % OP SOLN
1.0000 [drp] | Freq: Two times a day (BID) | OPHTHALMIC | Status: DC
  Administered 2023-07-06 – 2023-07-07 (×2): 1 [drp] via OPHTHALMIC
  Filled 2023-07-06: qty 10

## 2023-07-06 MED ORDER — TICAGRELOR 90 MG PO TABS
90.0000 mg | ORAL_TABLET | Freq: Two times a day (BID) | ORAL | Status: DC
Start: 2023-07-06 — End: 2023-07-07
  Administered 2023-07-06 – 2023-07-07 (×2): 90 mg via ORAL
  Filled 2023-07-06 (×2): qty 1

## 2023-07-06 MED ORDER — FENTANYL CITRATE (PF) 100 MCG/2ML IJ SOLN
INTRAMUSCULAR | Status: DC | PRN
  Administered 2023-07-06: 25 ug via INTRAVENOUS
  Administered 2023-07-06: 50 ug via INTRAVENOUS

## 2023-07-06 MED ORDER — LIDOCAINE HCL (PF) 1 % IJ SOLN
INTRAMUSCULAR | Status: AC
  Filled 2023-07-06: qty 60

## 2023-07-06 MED ORDER — MIDAZOLAM HCL 5 MG/5ML IJ SOLN
INTRAMUSCULAR | Status: DC | PRN
  Administered 2023-07-06: 1 mg via INTRAVENOUS

## 2023-07-06 MED ORDER — LIDOCAINE HCL (PF) 1 % IJ SOLN
INTRAMUSCULAR | Status: DC | PRN
  Administered 2023-07-06: 50 mL

## 2023-07-06 MED ORDER — MIDAZOLAM HCL 2 MG/2ML IJ SOLN
INTRAMUSCULAR | Status: AC
Start: 2023-07-06 — End: ?
  Filled 2023-07-06: qty 2

## 2023-07-06 MED ORDER — FENTANYL CITRATE (PF) 100 MCG/2ML IJ SOLN
INTRAMUSCULAR | Status: AC
  Filled 2023-07-06: qty 2

## 2023-07-06 MED ORDER — CHLORHEXIDINE GLUCONATE 4 % EX SOLN
4.0000 | Freq: Once | CUTANEOUS | Status: DC
  Filled 2023-07-06: qty 60

## 2023-07-06 MED ORDER — SPIRONOLACTONE 25 MG PO TABS
25.0000 mg | ORAL_TABLET | Freq: Every day | ORAL | Status: DC
  Administered 2023-07-07: 25 mg via ORAL
  Filled 2023-07-06: qty 1

## 2023-07-06 MED ORDER — FINASTERIDE 5 MG PO TABS
5.0000 mg | ORAL_TABLET | Freq: Every day | ORAL | Status: DC
  Administered 2023-07-07: 5 mg via ORAL
  Filled 2023-07-06: qty 1

## 2023-07-06 MED ORDER — EMPAGLIFLOZIN 10 MG PO TABS
10.0000 mg | ORAL_TABLET | Freq: Every day | ORAL | Status: DC
  Administered 2023-07-07: 10 mg via ORAL
  Filled 2023-07-06: qty 1

## 2023-07-06 MED ORDER — ONDANSETRON HCL 4 MG/2ML IJ SOLN: 4.0000 mg | Freq: Four times a day (QID) | INTRAMUSCULAR | Status: DC | PRN

## 2023-07-06 MED ORDER — ASPIRIN 81 MG PO TBEC
81.0000 mg | DELAYED_RELEASE_TABLET | Freq: Every day | ORAL | Status: DC
  Administered 2023-07-07: 81 mg via ORAL
  Filled 2023-07-06: qty 1

## 2023-07-06 MED ORDER — ADULT MULTIVITAMIN W/MINERALS CH
1.0000 | ORAL_TABLET | Freq: Every day | ORAL | Status: DC
  Administered 2023-07-07: 1 via ORAL
  Filled 2023-07-06: qty 1

## 2023-07-06 MED ORDER — SODIUM CHLORIDE 0.9 % IV SOLN
INTRAVENOUS | Status: DC
  Administered 2023-07-06: 250 mL via INTRAVENOUS

## 2023-07-06 MED ORDER — SODIUM CHLORIDE 0.9 % IV SOLN
80.0000 mg | INTRAVENOUS | Status: AC
Start: 2023-07-06 — End: 2023-07-06

## 2023-07-06 MED ORDER — LEVOTHYROXINE SODIUM 75 MCG PO TABS
75.0000 ug | ORAL_TABLET | Freq: Every day | ORAL | Status: DC
  Administered 2023-07-07: 75 ug via ORAL
  Filled 2023-07-06: qty 1

## 2023-07-06 MED ORDER — POVIDONE-IODINE 10 % EX SWAB
2.0000 | Freq: Once | CUTANEOUS | Status: AC
  Administered 2023-07-06: 2 via TOPICAL

## 2023-07-06 MED ORDER — ACETAMINOPHEN 325 MG PO TABS: 325.0000 mg | ORAL_TABLET | ORAL | Status: DC | PRN

## 2023-07-06 MED ORDER — ISOSORBIDE MONONITRATE ER 30 MG PO TB24
15.0000 mg | ORAL_TABLET | Freq: Every day | ORAL | Status: DC
  Administered 2023-07-07: 15 mg via ORAL
  Filled 2023-07-06: qty 1

## 2023-07-06 MED ORDER — CARVEDILOL 6.25 MG PO TABS
6.2500 mg | ORAL_TABLET | Freq: Two times a day (BID) | ORAL | Status: DC
  Administered 2023-07-07: 6.25 mg via ORAL
  Filled 2023-07-06: qty 1

## 2023-07-06 MED ORDER — EZETIMIBE 10 MG PO TABS
10.0000 mg | ORAL_TABLET | Freq: Every day | ORAL | Status: DC
  Administered 2023-07-07: 10 mg via ORAL
  Filled 2023-07-06: qty 1

## 2023-07-06 MED ORDER — GABAPENTIN 300 MG PO CAPS
600.0000 mg | ORAL_CAPSULE | Freq: Three times a day (TID) | ORAL | Status: DC
  Administered 2023-07-06 – 2023-07-07 (×2): 600 mg via ORAL
  Filled 2023-07-06 (×2): qty 2

## 2023-07-06 MED ORDER — CEFAZOLIN SODIUM-DEXTROSE 2-4 GM/100ML-% IV SOLN: 2.0000 g | INTRAVENOUS | Status: AC

## 2023-07-06 MED ORDER — CEFAZOLIN SODIUM-DEXTROSE 2-4 GM/100ML-% IV SOLN
INTRAVENOUS | Status: AC
  Administered 2023-07-06: 2 g via INTRAVENOUS
  Filled 2023-07-06: qty 100

## 2023-07-06 MED ORDER — FOLIC ACID 1 MG PO TABS
1.0000 mg | ORAL_TABLET | Freq: Every day | ORAL | Status: DC
  Administered 2023-07-07: 1 mg via ORAL
  Filled 2023-07-06: qty 1

## 2023-07-06 MED ORDER — SODIUM CHLORIDE 0.9 % IV SOLN
INTRAVENOUS | Status: AC
  Administered 2023-07-06: 80 mg
  Filled 2023-07-06: qty 2

## 2023-07-06 MED ORDER — SACUBITRIL-VALSARTAN 49-51 MG PO TABS
1.0000 | ORAL_TABLET | Freq: Two times a day (BID) | ORAL | Status: DC
  Administered 2023-07-07: 1 via ORAL
  Filled 2023-07-06: qty 1

## 2023-07-06 MED ORDER — LATANOPROST 0.005 % OP SOLN
1.0000 [drp] | Freq: Every day | OPHTHALMIC | Status: DC
  Administered 2023-07-06: 1 [drp] via OPHTHALMIC
  Filled 2023-07-06: qty 2.5

## 2023-07-06 MED ORDER — ATORVASTATIN CALCIUM 80 MG PO TABS
80.0000 mg | ORAL_TABLET | Freq: Every day | ORAL | Status: DC
  Administered 2023-07-06: 80 mg via ORAL
  Filled 2023-07-06: qty 1

## 2023-07-06 SURGICAL SUPPLY — 9 items
CABLE SURGICAL S-101-97-12 (CABLE) ×1 IMPLANT
ICD VIGILANT DR D233 (Pacemaker) IMPLANT
LEAD INGEVITY 7841 52 (Lead) IMPLANT
LEAD RELIANCE 0673 IMPLANT
PAD DEFIB RADIO PHYSIO CONN (PAD) ×1 IMPLANT
SHEATH 7FR PRELUDE SNAP 13 (SHEATH) IMPLANT
SHEATH 8FR PRELUDE SNAP 13 (SHEATH) IMPLANT
SHEATH PROBE COVER 6X72 (BAG) IMPLANT
TRAY PACEMAKER INSERTION (PACKS) ×1 IMPLANT

## 2023-07-06 NOTE — Interval H&P Note (Signed)
 History and Physical Interval Note:  07/06/2023 4:23 PM  Ross Riley  has presented today for surgery, with the diagnosis of chronic systolic heart failure.  The various methods of treatment have been discussed with the patient and family. After consideration of risks, benefits and other options for treatment, the patient has consented to  Procedure(s): ICD IMPLANT (N/A) as a surgical intervention.  The patient's history has been reviewed, patient examined, no change in status, stable for surgery.  I have reviewed the patient's chart and labs.  Questions were answered to the patient's satisfaction.     Ardeen Kohler

## 2023-07-07 ENCOUNTER — Ambulatory Visit (HOSPITAL_COMMUNITY)

## 2023-07-07 ENCOUNTER — Encounter (HOSPITAL_COMMUNITY): Payer: Self-pay | Admitting: Cardiology

## 2023-07-07 DIAGNOSIS — I255 Ischemic cardiomyopathy: Secondary | ICD-10-CM | POA: Diagnosis not present

## 2023-07-07 DIAGNOSIS — Z7982 Long term (current) use of aspirin: Secondary | ICD-10-CM | POA: Diagnosis not present

## 2023-07-07 DIAGNOSIS — Z7984 Long term (current) use of oral hypoglycemic drugs: Secondary | ICD-10-CM | POA: Diagnosis not present

## 2023-07-07 DIAGNOSIS — Z9889 Other specified postprocedural states: Secondary | ICD-10-CM | POA: Diagnosis not present

## 2023-07-07 DIAGNOSIS — I493 Ventricular premature depolarization: Secondary | ICD-10-CM | POA: Diagnosis not present

## 2023-07-07 DIAGNOSIS — I5022 Chronic systolic (congestive) heart failure: Secondary | ICD-10-CM | POA: Diagnosis not present

## 2023-07-07 DIAGNOSIS — Z955 Presence of coronary angioplasty implant and graft: Secondary | ICD-10-CM | POA: Diagnosis not present

## 2023-07-07 DIAGNOSIS — Z79899 Other long term (current) drug therapy: Secondary | ICD-10-CM | POA: Diagnosis not present

## 2023-07-07 DIAGNOSIS — Z7902 Long term (current) use of antithrombotics/antiplatelets: Secondary | ICD-10-CM | POA: Diagnosis not present

## 2023-07-07 DIAGNOSIS — I2510 Atherosclerotic heart disease of native coronary artery without angina pectoris: Secondary | ICD-10-CM | POA: Diagnosis not present

## 2023-07-07 DIAGNOSIS — E785 Hyperlipidemia, unspecified: Secondary | ICD-10-CM | POA: Diagnosis not present

## 2023-07-07 DIAGNOSIS — I110 Hypertensive heart disease with heart failure: Secondary | ICD-10-CM | POA: Diagnosis not present

## 2023-07-07 DIAGNOSIS — E119 Type 2 diabetes mellitus without complications: Secondary | ICD-10-CM | POA: Diagnosis not present

## 2023-07-07 MED FILL — Midazolam HCl Inj 2 MG/2ML (Base Equivalent): INTRAMUSCULAR | Qty: 1 | Status: AC

## 2023-07-07 NOTE — Discharge Summary (Addendum)
 ELECTROPHYSIOLOGY PROCEDURE DISCHARGE SUMMARY    Patient ID: Ross Riley,  MRN: [id number], DOB/AGE: Oct 25, 1950 73 y.o.  Admit date: 07/06/2023 Discharge date: 07/07/2023  Primary Care Physician: Raina Bunting, DO  Primary Cardiologist: Belva Boyden, MD  Electrophysiologist: Dr. Daneil Dunker    Primary Diagnosis:  Chronic Systolic CHF secondary to ICM  Secondary Diagnosis: CAD  DM II  HTN HLD  Allergies  Allergen Reactions   Metformin And Related Other (See Comments), Hives and Rash    Headache and dizzines  Headache and dizzines    headache and dizziness   Metformin Hcl Other (See Comments)    headache and dizziness     Procedures This Admission:  1.  Implantation of a [employer] single chamber ICD on 07/06/23 by Dr. Daneil Dunker.  The patient received a Radiation protection practitioner DR [phone] with a [employer] [phone] RA Lead  [employer] [id number] right ventricular lead. Pt did not require a LV or Left bundle Lead.  DFTs were deferred at time of implant There were no post procedure complications 2.  CXR on 07/07/23 demonstrated no pneumothorax status post device implantation.      Brief HPI:  Ross Riley is a 73 y.o. male was referred to electrophysiology in the outpatient setting  for consideration of ICD implantation.  Past medical history includes above.  The patient has persistent LV dysfunction despite guideline directed therapy.  Risks, benefits, and alternatives to ICD implantation were reviewed with the patient who wished to proceed.   Hospital Course:  The patient was admitted and underwent implantation of a [employer] dual chamber ICD with details as outlined above. They were monitored on telemetry overnight which demonstrated NSR .  Left chest was without hematoma or ecchymosis.  The device was interrogated and found to be functioning normally.  CXR was obtained and demonstrated no pneumothorax status post device  implantation..  Wound care, arm mobility, and restrictions were reviewed with the patient.  The patient was examined and considered stable for discharge to home.   The patient's discharge medications include Entresto , Spironolactone , Imdur , Jardiance , Coreg .   Anticoagulation resumption The patient will continue Brilinta  at discharge.   Physical Exam: Vitals:   07/06/23 2103 07/06/23 2349 07/07/23 0501 07/07/23 0722  BP: 116/80 115/73 (!) 138/90 123/75  Pulse: 61 64 66 64  Resp: 18 18 18 18   Temp: 98.8 F (37.1 C) 98.6 F (37 C) 98.4 F (36.9 C) 98.8 F (37.1 C)  TempSrc: Oral Oral Oral Oral  SpO2: 96% 97% 98% 97%  Weight:      Height:        GEN- NAD. A&O x 3.  HEENT: Normocephalic, atraumatic Lungs- CTAB, normal effort.  Heart- RRR. No M/G/R.  GI- Soft, NT, ND.  Extremities- No clubbing, cyanosis, or edema Skin- Warm and dry, no rash or lesion. ICD site with small hematoma, minimal oozing at site         Discharge Medications:  Allergies as of 07/07/2023       Reactions   Metformin And Related Other (See Comments), Hives, Rash   Headache and dizzines Headache and dizzines    headache and dizziness   Metformin Hcl Other (See Comments)   headache and dizziness        Medication List     TAKE these medications    Accu-Chek Aviva Plus test strip Generic drug: glucose blood Use to check blood sugar up to 1 x per day  Accu-Chek Aviva Plus w/Device Kit Use to check blood sugar up to 1 x per day   Accu-Chek Softclix Lancets lancets Use to check blood sugar up to 1 x per day   aspirin  EC 81 MG tablet Take 81 mg by mouth daily. Swallow whole.   atorvastatin  80 MG tablet Commonly known as: LIPITOR  Take 1 tablet (80 mg total) by mouth at bedtime.   Brilinta  90 MG Tabs tablet Generic drug: ticagrelor  TAKE 1 TABLET(90 MG) BY MOUTH TWICE DAILY   carvedilol  6.25 MG tablet Commonly known as: COREG  Take 1 tablet (6.25 mg total) by mouth 2 (two) times  daily with a meal.   dorzolamide -timolol  2-0.5 % ophthalmic solution Commonly known as: COSOPT  Place 1 drop into both eyes 2 (two) times daily.   empagliflozin  10 MG Tabs tablet Commonly known as: JARDIANCE  Tome 1 tableta (10 mg en total) por va oral diariamente. (Take 1 tablet (10 mg total) by mouth daily.)   Jardiance  25 MG Tabs tablet Generic drug: empagliflozin  Take 25 mg by mouth daily.   Entresto  49-51 MG Generic drug: sacubitril -valsartan  Take 1 tablet by mouth 2 (two) times daily.   ezetimibe  10 MG tablet Commonly known as: Zetia  Take 1 tablet (10 mg total) by mouth daily.   finasteride  5 MG tablet Commonly known as: PROSCAR  Take 1 tablet (5 mg total) by mouth daily.   folic acid  1 MG tablet Commonly known as: FOLVITE  Tome 1 tableta (1 mg en total) por va oral diariamente. (Take 1 tablet (1 mg total) by mouth daily.)   gabapentin  600 MG tablet Commonly known as: Neurontin  Take 1 tablet (600 mg total) by mouth 3 (three) times daily.   isosorbide  mononitrate 30 MG 24 hr tablet Commonly known as: IMDUR  TAKE 1/2 TABLET(15 MG) BY MOUTH DAILY   latanoprost  0.005 % ophthalmic solution Commonly known as: XALATAN  Place 1 drop into both eyes at bedtime.   levothyroxine  75 MCG tablet Commonly known as: SYNTHROID  Take 75 mcg by mouth daily before breakfast.   multivitamin with minerals Tabs tablet Tome 1 tableta por va oral diariamente. (Take 1 tablet by mouth daily.)   nitroGLYCERIN  0.4 MG SL tablet Commonly known as: NITROSTAT  Place 1 tablet (0.4 mg total) under the tongue every 5 (five) minutes as needed for chest pain.   spironolactone  25 MG tablet Commonly known as: ALDACTONE  Take 1 tablet (25 mg total) by mouth daily.        Disposition: Home with usual follow up as in AVS. Will plan for additional follow up on Monday in wound clinic for wound check.   Signed, Creighton Doffing, NP-C, AGACNP-BC [facility] - Electrophysiology  07/07/2023,  8:28 AM   I have seen, examined the patient, and reviewed the above assessment and plan.    Hospital Course: Ross Riley presented on 07/06/2023 for primary prevention ICD implant.  Patient underwent uncomplicated dual-chamber ICD implant.  He was kept overnight for monitoring.  There were no acute overnight events or obvious complications.  This morning, patient reported feeling relatively well.  He had some mild soreness at his device site while pressure dressing was in place.  Otherwise no new or acute complaints.  General: Well developed, in no acute distress.  Neck: No JVD.  Cardiac: Normal rate, regular rhythm.  Left chest pocket with no significant hematoma or active bleeding. Some bruising.  Resp: Normal work of breathing.  Ext: No edema.  Neuro: No gross focal deficits.  Psych: Normal affect.   Plan:  -  Chest x-ray with appropriately positioned and no pneumothorax. -Bedside interrogation was performed with appropriate device function and stable lead parameters. - Usual post implant instructions were provided regarding wound care and activity restrictions. - Given that patient is on aspirin  and ticagrelor , we have arranged for wound check in 1 week.  Duration of Discharge Encounter: 57 minutes  Ardeen Kohler, MD 07/07/2023 7:51 PM

## 2023-07-07 NOTE — Discharge Instructions (Addendum)
 After Your ICD (Implantable Cardiac Defibrillator)   You have a Environmental manager ICD  ACTIVITY Do not lift your arm above shoulder height for 1 week after your procedure. After 7 days, you may progress as below.  You should remove your sling 24 hours after your procedure, unless otherwise instructed by your provider.     Tuesday Jul 14, 2023  Wednesday Jul 15, 2023 Thursday Jul 16, 2023 Friday Jul 17, 2023   Do not lift, push, pull, or carry anything over 10 pounds with the affected arm until 6 weeks (Tuesday August 18, 2023 ) after your procedure.   You may drive AFTER your wound check, unless you have been told otherwise by your provider.   Ask your healthcare provider when you can go back to work   INCISION/Dressing  Do not remove steri-strips or glue as below.   Monitor your defibrillator site for redness, swelling, and drainage. Call the device clinic at [phone] if you experience these symptoms or fever/chills.  If your incision is sealed with Steri-strips or staples, you may shower 7 days after your procedure or when told by your provider. Do not remove the steri-strips or let the shower hit directly on your site. You may wash around your site with soap and water.     Avoid lotions, ointments, or perfumes over your incision until it is well-healed.  You may use a hot tub or a pool AFTER your wound check appointment if the incision is completely closed.  Your ICD is designed to protect you from life threatening heart rhythms. Because of this, you may receive a shock.   1 shock with no symptoms:  Call the office during business hours. 1 shock with symptoms (chest pain, chest pressure, dizziness, lightheadedness, shortness of breath, overall feeling unwell):  Call 911. If you experience 2 or more shocks in 24 hours:  Call 911. If you receive a shock, you should not drive for 6 months per the [place] DMV IF you receive appropriate therapy from your ICD.   ICD Alerts:  Some  alerts are vibratory and others beep. These are NOT emergencies. Please call our office to let us  know. If this occurs at night or on weekends, it can wait until the next business day. Send a remote transmission.  If your device is capable of reading fluid status (for heart failure), you will be offered monthly monitoring to review this with you.   DEVICE MANAGEMENT Remote monitoring is used to monitor your ICD from home. This monitoring is scheduled every 91 days by our office. It allows us  to keep an eye on the functioning of your device to ensure it is working properly. You will routinely see your Electrophysiologist annually (more often if necessary).   You should receive your ID card for your new device in 4-8 weeks. Keep this card with you at all times once received. Consider wearing a medical alert bracelet or necklace.  Your ICD  may be MRI compatible. This will be discussed at your next office visit/wound check.  You should avoid contact with strong electric or magnetic fields.   Do not use amateur (ham) radio equipment or electric (arc) welding torches. MP3 player headphones with magnets should not be used. Some devices are safe to use if held at least 12 inches (30 cm) from your defibrillator. These include power tools, lawn mowers, and speakers. If you are unsure if something is safe to use, ask your health care provider.  When using your cell  phone, hold it to the ear that is on the opposite side from the defibrillator. Do not leave your cell phone in a pocket over the defibrillator.  You may safely use electric blankets, heating pads, computers, and microwave ovens.  Call the office right away if: You have chest pain. You feel more than one shock. You feel more short of breath than you have felt before. You feel more light-headed than you have felt before. Your incision starts to open up.  This information is not intended to replace advice given to you by your health care  provider. Make sure you discuss any questions you have with your health care provider.        Despus de su DCI (Desfibrilador Cardaco Implantable)  ? Tiene un DCI de [employer]  ACTIVIDAD ? No levante el brazo por encima del hombro durante 1 semana despus del procedimiento. Despus de [address], puede progresar como se indica a continuacin.  ? Debe retirarse el cabestrillo 24 horas despus del procedimiento, a menos que su mdico le indique lo contrario.  Martes 13 de [address] de 2025 [address] 14 de [place] de 2025 Jueves 15 de [place] de 2025 Viernes 16 de [place] de 2025  ? No levante, empuje, tire ni cargue objetos que pesen ms de 4.5 kg con el brazo afectado hasta 6 semanas (martes 17 de junio de 2025) despus del procedimiento.  ? Puede conducir DESPUS de la revisin de la herida, a menos que su mdico le indique lo contrario.  Pregunte a su profesional de la salud cundo puede volver al trabajo.  INCISIN/Apsito  Si dej un vendaje externo cuadrado grande, puede retirarlo 24 horas despus del procedimiento. No retire las tiras [employer] ni el pegamento como se indica a continuacin.  Vigile la zona de aplicacin del desfibrilador para detectar enrojecimiento, hinchazn o supuracin. Llame a la clnica del dispositivo al [phone] si experimenta estos sntomas o fiebre o escalofros.  Si su incisin est sellada con tiras adhesivas o grapas, puede ducharse 7 das despus del procedimiento o cuando se lo indique su profesional de [employer]. No retire las tiras [employer] ni permita que la ducha toque directamente la zona. Puede lavarse la zona con agua y jabn.  Si le dieron de alta con un cabestrillo, no lo use durante el da ms de 48 horas despus de la [place], a menos que se le indique lo contrario. Esto puede aumentar el riesgo de rigidez y Engineer, mining en el hombro.  ? Evite aplicar lociones, ungentos o perfumes sobre la incisin hasta que est bien cicatrizada.  ? Puede  usar un jacuzzi o una piscina DESPUS de su cita para la revisin de la herida si la incisin est completamente cerrada.  ? Su DCI est diseado para protegerlo de ritmos cardacos potencialmente mortales. Por ello, podra recibir Furniture conservator/restorer.  ? 1 descarga sin sntomas: Llame al consultorio en horario de atencin. ? 1 descarga con sntomas (dolor en el pecho, presin en el pecho, mareos, aturdimiento, dificultad para respirar, Dentist general): Llame al 911. ? Si experimenta 2 o ms descargas en 24 horas: Llame al 911. ? Si recibe [employer], no debe conducir durante 6 meses, segn el DMV de [place], si recibe el tratamiento adecuado con su DCI.  Alertas del DCI: Algunas alertas vibran y otras emiten un pitido. Estas NO son emergencias. Por favor, llame a nuestra oficina para informarnos. Si esto ocurre por la noche o los fines de [employer], puede esperar [employer]  el siguiente da hbil. Enve una transmisin remota.  Si su dispositivo puede leer el estado de los fluidos (para insuficiencia cardaca), se le ofrecer un monitoreo mensual para revisarlo con usted.  GESTIN DEL DISPOSITIVO Se utiliza monitoreo remoto para monitorear su DCI desde casa. Nuestro consultorio programa este monitoreo cada [address]. Esto nos permite supervisar el funcionamiento de su dispositivo para garantizar su correcto funcionamiento. Ver a su electrofisilogo anualmente (con mayor frecuencia si es necesario).  Recibir su tarjeta de identificacin para su nuevo dispositivo en 4 a 8 semanas. Conserve esta tarjeta consigo en todo momento una vez recibida. Considere usar un brazalete o collar de Secondary school teacher.  Su DCI puede ser compatible con resonancias magnticas. Esto se discutir en su prxima visita al consultorio/revisin de la herida. Debe evitar el contacto con campos elctricos o magnticos intensos.  ? No utilice equipos de radioaficionado ni sopletes de Armed forces training and education officer (arco). No utilice  auriculares de reproductor MP3 con imanes. Algunos dispositivos son seguros si se mantienen a una distancia mnima de 30 cm (12 pulgadas) del desfibrilador. Estos incluyen herramientas elctricas, cortadoras de csped y altavoces. Si no est seguro de si algn dispositivo es seguro, consulte a su profesional de [employer].  ? Al usar su telfono Aeronautical engineer, colquelo en la oreja opuesta al [employer]. No deje el telfono celular en un bolsillo Human resources officer.  ? Puede usar [employer], almohadillas trmicas, computadoras y hornos microondas de forma segura.  Llame al consultorio de inmediato si: ? Tiene dolor en el pecho. ? Siente ms de Furniture conservator/restorer. ? Siente ms dificultad para respirar que antes. ? Siente ms mareo que antes. La incisin comienza a abrirse.  Esta informacin no sustituye el consejo de su profesional de [employer]. Asegrese de Science writer cualquier duda que tenga con su profesional de la salud.

## 2023-07-14 ENCOUNTER — Ambulatory Visit: Attending: Cardiology

## 2023-07-14 DIAGNOSIS — R002 Palpitations: Secondary | ICD-10-CM | POA: Diagnosis not present

## 2023-07-19 ENCOUNTER — Ambulatory Visit (HOSPITAL_COMMUNITY): Payer: Self-pay | Admitting: Cardiology

## 2023-07-22 ENCOUNTER — Ambulatory Visit

## 2023-07-22 ENCOUNTER — Ambulatory Visit: Attending: Internal Medicine

## 2023-07-22 ENCOUNTER — Telehealth: Payer: Self-pay

## 2023-07-22 DIAGNOSIS — Z950 Presence of cardiac pacemaker: Secondary | ICD-10-CM | POA: Diagnosis not present

## 2023-07-22 NOTE — Telephone Encounter (Signed)
 Sent message to notify patient of f.u wound check.

## 2023-07-22 NOTE — Patient Instructions (Signed)
   After Your Pacemaker   Monitor your pacemaker site for redness, swelling, and drainage. Call the device clinic at [phone] if you experience these symptoms or fever/chills.  Keep your incision dry until Monday May 26th  Do not lift, push or pull greater than 10 pounds with the affected arm until 6 weeks (08/17/23) after your procedure. There are no other restrictions in arm movement after your wound check appointment.  You may drive, unless driving has been restricted by your healthcare providers.  Remote monitoring is used to monitor your pacemaker from home. This monitoring is scheduled every 91 days by our office. It allows us  to keep an eye on the functioning of your device to ensure it is working properly. You will routinely see your Electrophysiologist annually (more often if necessary).

## 2023-07-28 ENCOUNTER — Ambulatory Visit: Attending: Cardiology

## 2023-07-28 DIAGNOSIS — Z950 Presence of cardiac pacemaker: Secondary | ICD-10-CM | POA: Insufficient documentation

## 2023-07-28 LAB — CUP PACEART INCLINIC DEVICE CHECK
Date Time Interrogation Session: 20250521073116
Implantable Lead Connection Status: 753985
Implantable Lead Connection Status: 753985
Implantable Lead Implant Date: 20250505
Implantable Lead Implant Date: 20250505
Implantable Lead Location: 753859
Implantable Lead Location: 753860
Implantable Lead Model: 673
Implantable Lead Model: 7841
Implantable Lead Serial Number: 560644
Implantable Lead Serial Number: 7463987
Implantable Pulse Generator Implant Date: 20250505
Pulse Gen Serial Number: 695502

## 2023-07-28 NOTE — Progress Notes (Signed)
 Wound check f/u in clinic. CDI approximated. No redness erythema. No concerns at this time. Educated about s/s infection and when to call clinic.

## 2023-07-28 NOTE — Progress Notes (Signed)
 Normal dual chamber pacemaker wound check. Presenting rhythm: APVS . Wound mostly well healed. One small area open. Steristrip reapplied. CL assessed. Appointment made for next week to recheck. Routine testing performed. Thresholds, sensing, and impedances consistent with implant measurements and at 3.5V safety margin/auto capture until 3 month visit. No episodes. Reviewed arm restrictions to continue for 6 weeks total post op.  Pt enrolled in remote follow-up.

## 2023-07-29 ENCOUNTER — Ambulatory Visit: Payer: Self-pay | Admitting: Cardiology

## 2023-08-09 ENCOUNTER — Other Ambulatory Visit: Payer: Self-pay | Admitting: Family Medicine

## 2023-08-09 DIAGNOSIS — K219 Gastro-esophageal reflux disease without esophagitis: Secondary | ICD-10-CM

## 2023-08-11 NOTE — Telephone Encounter (Signed)
 Unable to refill per protocol, Rx expired. Discontinued 06/10/23.  Requested Prescriptions  Pending Prescriptions Disp Refills   omeprazole  (PRILOSEC) 40 MG capsule [Pharmacy Med Name: OMEPRAZOLE  40MG  CAPSULES] 90 capsule 1    Sig: TAKE 1 CAPSULE(40 MG) BY MOUTH DAILY BEFORE BREAKFAST     Gastroenterology: Proton Pump Inhibitors Passed - 08/11/2023  9:14 AM      Passed - Valid encounter within last 12 months    Recent Outpatient Visits           2 months ago Right arm pain   [place] [facility] [place], Kayleen Party, DO   3 months ago Annual physical exam   [place] Raina Bunting, DO       Future Appointments             In 1 month Sto[place], Kizzie Perks, MD [facility] [place]   In 2 months [place], Kayleen Party, DO [facility], [facility]

## 2023-08-13 ENCOUNTER — Ambulatory Visit: Payer: 59

## 2023-08-13 VITALS — Ht 66.0 in | Wt 166.0 lb

## 2023-08-13 DIAGNOSIS — Z0000 Encounter for general adult medical examination without abnormal findings: Secondary | ICD-10-CM

## 2023-08-13 NOTE — Patient Instructions (Signed)
 [name] , Thank you for taking time out of your busy schedule to complete your Annual Wellness Visit with me. I enjoyed our conversation and look forward to speaking with you again next year. I, as well as your care team,  appreciate your ongoing commitment to your health goals. Please review the following plan we discussed and let me know if I can assist you in the future. Your Game plan/ To Do List    Referrals: If you haven't heard from the office you've been referred to, please reach out to them at the phone provided.   Follow up Visits: Next Medicare AWV with our clinical staff: 08/19/24 @ 2:40p   Have you seen your provider in the last 6 months (3 months if uncontrolled diabetes)?  Next Office Visit with your provider: 10/26/23 @ 2:20p  Clinician Recommendations:  Aim for 30 minutes of exercise or brisk walking, 6-8 glasses of water, and 5 servings of fruits and vegetables each day.       This is a list of the screening recommended for you and due dates:  Health Maintenance  Topic Date Due   COVID-19 Vaccine (4 - 2024-25 season) 11/02/2022   DTaP/Tdap/Td vaccine (2 - Td or Tdap) 12/16/2022   Flu Shot  10/02/2023   Hemoglobin A1C  10/25/2023   Eye exam for diabetics  04/03/2024   Yearly kidney health urinalysis for diabetes  04/26/2024   Complete foot exam   04/26/2024   Yearly kidney function blood test for diabetes  06/30/2024   Medicare Annual Wellness Visit  08/12/2024   Colon Cancer Screening  06/26/2032   Pneumococcal Vaccine for age over 66  Completed   Hepatitis C Screening  Completed   Zoster (Shingles) Vaccine  Completed   HPV Vaccine  Aged Out   Meningitis B Vaccine  Aged Out    Advanced directives: (Declined) Advance directive discussed with you today. Even though you declined this today, please call our office should you change your mind, and we can give you the proper paperwork for you to fill out. Advance Care Planning is important because it:  [x]  Makes  sure you receive the medical care that is consistent with your values, goals, and preferences  [x]  It provides guidance to your family and loved ones and reduces their decisional burden about whether or not they are making the right decisions based on your wishes.  Follow the link provided in your after visit summary or read over the paperwork we have mailed to you to help you started getting your Advance Directives in place. If you need assistance in completing these, please reach out to us  so that we can help you!  See attachments for Preventive Care and Fall Prevention Tips.

## 2023-08-13 NOTE — Progress Notes (Signed)
 Subjective:   Ross Riley is a 73 y.o. who presents for a Medicare Wellness preventive visit.  As a reminder, Annual Wellness Visits don't include a physical exam, and some assessments may be limited, especially if this visit is performed virtually. We may recommend an in-person follow-up visit with your provider if needed.  Visit Complete: Virtual I connected with  Ross Riley on 08/13/23 by a audio enabled telemedicine application and verified that I am speaking with the correct person using two identifiers.  Patient Location: Home  Provider Location: Home Office  I discussed the limitations of evaluation and management by telemedicine. The patient expressed understanding and agreed to proceed.  Vital Signs: Because this visit was a virtual/telehealth visit, some criteria may be missing or patient reported. Any vitals not documented were not able to be obtained and vitals that have been documented are patient reported.    Persons Participating in Visit: Patient assisted by Interpreter.  AWV Questionnaire: No: Patient Medicare AWV questionnaire was not completed prior to this visit.  Cardiac Risk Factors include: advanced age (>71men, >25 women);male gender;diabetes mellitus;hypertension     Objective:    Today's Vitals   08/13/23 1449  Weight: 166 lb (75.3 kg)  Height: 5' 6 (1.676 m)   Body mass index is 26.79 kg/m.     08/13/2023    3:00 PM 07/06/2023    1:18 PM 03/11/2023    4:31 PM 02/18/2023    2:19 PM 02/17/2023    1:07 PM 12/20/2022    1:00 PM 12/20/2022    9:57 AM  Advanced Directives  Does Patient Have a Medical Advance Directive? No No No No No No No  Would patient like information on creating a medical advance directive? No - Patient declined No - Patient declined  No - Patient declined No - Patient declined No - Patient declined     Current Medications (verified) Outpatient Encounter Medications as of 08/13/2023  Medication Sig   ACCU-CHEK  AVIVA PLUS test strip Use to check blood sugar up to 1 x per day   Accu-Chek Softclix Lancets lancets Use to check blood sugar up to 1 x per day   aspirin  EC 81 MG tablet Take 81 mg by mouth daily. Swallow whole.   atorvastatin  (LIPITOR ) 80 MG tablet Take 1 tablet (80 mg total) by mouth at bedtime.   Blood Glucose Monitoring Suppl (ACCU-CHEK AVIVA PLUS) w/Device KIT Use to check blood sugar up to 1 x per day   carvedilol  (COREG ) 6.25 MG tablet Take 1 tablet (6.25 mg total) by mouth 2 (two) times daily with a meal.   dorzolamide -timolol  (COSOPT ) 22.3-6.8 MG/ML ophthalmic solution Place 1 drop into both eyes 2 (two) times daily.   empagliflozin  (JARDIANCE ) 10 MG TABS tablet Take 1 tablet (10 mg total) by mouth daily. (Patient not taking: Reported on 07/07/2023)   ezetimibe  (ZETIA ) 10 MG tablet Take 1 tablet (10 mg total) by mouth daily.   finasteride  (PROSCAR ) 5 MG tablet Take 1 tablet (5 mg total) by mouth daily.   folic acid  (FOLVITE ) 1 MG tablet Take 1 tablet (1 mg total) by mouth daily.   gabapentin  (NEURONTIN ) 600 MG tablet Take 1 tablet (600 mg total) by mouth 3 (three) times daily.   isosorbide  mononitrate (IMDUR ) 30 MG 24 hr tablet TAKE 1/2 TABLET(15 MG) BY MOUTH DAILY   JARDIANCE  25 MG TABS tablet Take 25 mg by mouth daily.   latanoprost  (XALATAN ) 0.005 % ophthalmic solution Place 1 drop into  both eyes at bedtime.   levothyroxine  (SYNTHROID ) 75 MCG tablet Take 75 mcg by mouth daily before breakfast.   Multiple Vitamin (MULTIVITAMIN WITH MINERALS) TABS tablet Take 1 tablet by mouth daily.   nitroGLYCERIN  (NITROSTAT ) 0.4 MG SL tablet Place 1 tablet (0.4 mg total) under the tongue every 5 (five) minutes as needed for chest pain.   sacubitril -valsartan  (ENTRESTO ) 49-51 MG Take 1 tablet by mouth 2 (two) times daily.   spironolactone  (ALDACTONE ) 25 MG tablet Take 1 tablet (25 mg total) by mouth daily.   ticagrelor  (BRILINTA ) 90 MG TABS tablet TAKE 1 TABLET(90 MG) BY MOUTH TWICE DAILY   No  facility-administered encounter medications on file as of 08/13/2023.    Allergies (verified) Metformin and related and Metformin hcl   History: Past Medical History:  Diagnosis Date   Arthritis    BPH (benign prostatic hyperplasia)    Chronic kidney disease    kidney stones   Dystonia    ED (erectile dysfunction)    GERD (gastroesophageal reflux disease)    Glaucoma (increased eye pressure)    Hematuria    Hemorrhoids    Hyperlipidemia    Hypertension    Thyroid  disease    Past Surgical History:  Procedure Laterality Date   CATARACT EXTRACTION Bilateral    CORONARY/GRAFT ACUTE MI REVASCULARIZATION N/A 12/20/2022   Procedure: Coronary/Graft Acute MI Revascularization;  Surgeon: Wenona Hamilton, MD;  Location: [facility] INVASIVE CV LAB;  Service: Cardiovascular;  Laterality: N/A;   EXTRACORPOREAL SHOCK WAVE LITHOTRIPSY Right    ICD IMPLANT N/A 07/06/2023   Procedure: ICD IMPLANT;  Surgeon: Ardeen Kohler, MD;  Location: [facility] INVASIVE CV LAB;  Service: Cardiovascular;  Laterality: N/A;   JOINT REPLACEMENT Left 08/2016   left knee, [place]   KIDNEY STONE SURGERY  2009   LEFT HEART CATH AND CORONARY ANGIOGRAPHY N/A 12/20/2022   Procedure: LEFT HEART CATH AND CORONARY ANGIOGRAPHY;  Surgeon: Wenona Hamilton, MD;  Location: [facility] INVASIVE CV LAB;  Service: Cardiovascular;  Laterality: N/A;   PROSTATE SURGERY  2011   TRANSURETHRAL RESECTION OF PROSTATE N/A 12/06/2014   Procedure: TRANSURETHRAL RESECTION OF THE PROSTATE (TURP);  Surgeon: Bart Born, MD;  Location: [facility] ORS;  Service: Urology;  Laterality: N/A;   Family History  Problem Relation Age of Onset   Heart disease Mother    Hypertension Brother    Social History   Socioeconomic History   Marital status: Married    Spouse name: Not on file   Number of children: Not on file   Years of education: Not on file   Highest education level: 9th grade  Occupational History   Occupation: retired  Tobacco Use   Smoking  status: Former    Current packs/day: 0.00    Average packs/day: 1 pack/day for 32.0 years (32.0 ttl pk-yrs)    Types: Cigarettes    Start date: 03/04/1959    Quit date: 03/04/1991    Years since quitting: 32.4   Smokeless tobacco: Former  Building services engineer status: Never Used  Substance and Sexual Activity   Alcohol use: Yes    Alcohol/week: 7.0 standard drinks of alcohol    Types: 7 Shots of liquor per week    Comment: 1-2 shots of vodka daily   Drug use: No   Sexual activity: Yes    Birth control/protection: None  Other Topics Concern   Not on file  Social History Narrative   Not on file   Social Drivers of Health  Financial Resource Strain: Low Risk  (08/13/2023)   Overall Financial Resource Strain (CARDIA)    Difficulty of Paying Living Expenses: Not hard at all  Food Insecurity: No Food Insecurity (08/13/2023)   Hunger Vital Sign    Worried About Running Out of Food in the Last Year: Never true    Ran Out of Food in the Last Year: Never true  Transportation Needs: No Transportation Needs (08/13/2023)   PRAPARE - Administrator, Civil Service (Medical): No    Lack of Transportation (Non-Medical): No  Physical Activity: Inactive (08/13/2023)   Exercise Vital Sign    Days of Exercise per Week: 0 days    Minutes of Exercise per Session: 0 min  Stress: No Stress Concern Present (08/13/2023)   [employer] of Occupational Health - Occupational Stress Questionnaire    Feeling of Stress: Not at all  Social Connections: Socially Integrated (08/13/2023)   Social Connection and Isolation Panel    Frequency of Communication with Friends and Family: More than three times a week    Frequency of Social Gatherings with Friends and Family: More than three times a week    Attends Religious Services: More than 4 times per year    Active Member of [employer] or Organizations: Yes    Attends Engineer, structural: More than 4 times per year    Marital Status: Married   Recent Concern: Social Connections - Socially Isolated (07/06/2023)   Social Connection and Isolation Panel    Frequency of Communication with Friends and Family: Never    Frequency of Social Gatherings with Friends and Family: Never    Attends Religious Services: Never    Database administrator or Organizations: No    Attends Engineer, structural: Never    Marital Status: Married    Tobacco Counseling Counseling given: Not Answered    Clinical Intake:  Pre-visit preparation completed: Yes  Pain : No/denies pain     BMI - recorded: 26.79 Nutritional Status: BMI 25 -29 Overweight Nutritional Risks: None Diabetes: Yes CBG done?: No Did pt. bring in CBG monitor from home?: No  Lab Results  Component Value Date   HGBA1C 7.6 (H) 04/27/2023   HGBA1C 7.0 (H) 12/20/2022   HGBA1C 7.3 10/02/2022     How often do you need to have someone help you when you read instructions, pamphlets, or other written materials from your doctor or pharmacy?: 1 - Never  Interpreter Needed?: Yes Interpreter Agency: [employer] Interpreter Name: Rainell Buoy ID: [phone] Patient Declined Interpreter : No  Information entered by :: Farris Hong LPN   Activities of Daily Living      08/13/2023    2:57 PM 07/06/2023    6:19 PM  In your present state of health, do you have any difficulty performing the following activities:  Hearing? 0 0  Vision? 0 0  Difficulty concentrating or making decisions? 0 0  Walking or climbing stairs? 0   Dressing or bathing? 0   Doing errands, shopping? 0   Preparing Food and eating ? N   Using the Toilet? N   In the past six months, have you accidently leaked urine? Y   Comment Followed by medical attention   Do you have problems with loss of bowel control? N   Managing your Medications? N   Managing your Finances? N   Housekeeping or managing your Housekeeping? N     Patient Care Team: Raina Bunting, DO as PCP -  General  (Family Medicine) Jerelene Monday Deadra Everts, MD as PCP - Cardiology (Cardiology) Ardeen Kohler, MD as PCP - Electrophysiology (Cardiology) Lora Robinsons, MD as Referring Physician (Internal Medicine) [facility])   I have updated your Care Teams any recent Medical Services you may have received from other providers in the past year.     Assessment:   This is a routine wellness examination for [facility].  Hearing/Vision screen Hearing Screening - Comments:: Denies hearing difficulties   Vision Screening - Comments:: Wears rx glasses - up to date with routine eye exams with  Dr Jorge Newcomer   Goals Addressed               This Visit's Progress     Remain active (pt-stated)         Depression Screen      08/13/2023    2:55 PM 12/26/2022    2:03 PM 10/24/2022    2:25 PM 08/07/2022    2:31 PM 06/23/2022    4:33 PM 04/25/2022   10:24 AM 05/29/2021   10:24 AM  PHQ 2/9 Scores  PHQ - 2 Score 0 0 1 2 0 2 0  PHQ- 9 Score  0 5 4  3  0    Fall Risk      08/13/2023    2:58 PM 10/24/2022    2:25 PM 08/07/2022    2:35 PM 06/23/2022    4:34 PM 05/29/2021   10:24 AM  Fall Risk   Falls in the past year? 1 0 0 0 0  Number falls in past yr: 0 0 0  0  Injury with Fall? 0 0 0  0  Comment Followed by medical attention      Risk for fall due to : No Fall Risks No Fall Risks No Fall Risks  No Fall Risks  Follow up Falls evaluation completed Falls evaluation completed Falls prevention discussed  Falls evaluation completed      Data saved with a previous flowsheet row definition    MEDICARE RISK AT HOME:   Medicare Risk at Home Any stairs in or around the home?: Yes If so, are there any without handrails?: No Home free of loose throw rugs in walkways, pet beds, electrical cords, etc?: Yes Adequate lighting in your home to reduce risk of falls?: Yes Life alert?: No Use of a cane, walker or w/c?: No Grab bars in the bathroom?: Yes Shower chair or bench in shower?: No Elevated toilet  seat or a handicapped toilet?: No  TIMED UP AND GO:  Was the test performed?  No  Cognitive Function: 6CIT completed        08/13/2023    3:00 PM 08/07/2022    2:42 PM  6CIT Screen  What Year? 0 points 0 points  What month? 0 points 0 points  What time? 0 points 0 points  Count back from 20 0 points 0 points  Months in reverse 0 points 0 points  Repeat phrase 0 points 2 points  Total Score 0 points 2 points    Immunizations Immunization History  Administered Date(s) Administered   Fluad Quad(high Dose 65+) 11/05/2018, 11/15/2019, 12/12/2020, 04/25/2022   Fluad Trivalent(High Dose 65+) 12/26/2022   Influenza Inj Mdck Quad Pf 12/02/2017   Influenza, High Dose Seasonal PF 11/24/2016   Influenza,inj,Quad PF,6+ Mos 12/15/2012, 12/07/2014   Influenza-Unspecified 12/07/2014, 02/13/2016, 12/02/2017   PFIZER(Purple Top)SARS-COV-2 Vaccination 04/25/2019, 05/16/2019, 04/02/2020   Pneumococcal Conjugate-13 04/07/2017   Pneumococcal Polysaccharide-23 04/25/2013, 04/13/2018  Tdap 12/15/2012   Zoster Recombinant(Shingrix) 06/22/2016, 04/15/2018    Screening Tests Health Maintenance  Topic Date Due   COVID-19 Vaccine (4 - 2024-25 season) 11/02/2022   DTaP/Tdap/Td (2 - Td or Tdap) 12/16/2022   INFLUENZA VACCINE  10/02/2023   HEMOGLOBIN A1C  10/25/2023   OPHTHALMOLOGY EXAM  04/03/2024   Diabetic kidney evaluation - Urine ACR  04/26/2024   FOOT EXAM  04/26/2024   Diabetic kidney evaluation - eGFR measurement  06/30/2024   Medicare Annual Wellness (AWV)  08/12/2024   Colonoscopy  06/26/2032   Pneumococcal Vaccine: 50+ Years  Completed   Hepatitis C Screening  Completed   Zoster Vaccines- Shingrix  Completed   HPV VACCINES  Aged Out   Meningococcal B Vaccine  Aged Out    Health Maintenance  Health Maintenance Due  Topic Date Due   COVID-19 Vaccine (4 - 2024-25 season) 11/02/2022   DTaP/Tdap/Td (2 - Td or Tdap) 12/16/2022   Health Maintenance Items Addressed:   Additional  Screening:  Vision Screening: Recommended annual ophthalmology exams for early detection of glaucoma and other disorders of the eye. Would you like a referral to an eye doctor? No    Dental Screening: Recommended annual dental exams for proper oral hygiene  Community Resource Referral / Chronic Care Management: CRR required this visit?  No   CCM required this visit?  No   Plan:    I have personally reviewed and noted the following in the patient's chart:   Medical and social history Use of alcohol, tobacco or illicit drugs  Current medications and supplements including opioid prescriptions. Patient is not currently taking opioid prescriptions. Functional ability and status Nutritional status Physical activity Advanced directives List of other physicians Hospitalizations, surgeries, and ER visits in previous 12 months Vitals Screenings to include cognitive, depression, and falls Referrals and appointments  In addition, I have reviewed and discussed with patient certain preventive protocols, quality metrics, and best practice recommendations. A written personalized care plan for preventive services as well as general preventive health recommendations were provided to patient.   Dewayne Ford, LPN   04/16/863   After Visit Summary: (MyChart) Due to this being a telephonic visit, the after visit summary with patients personalized plan was offered to patient via MyChart   Notes: Nothing significant to report at this time.

## 2023-08-19 ENCOUNTER — Telehealth: Payer: Self-pay | Admitting: Family Medicine

## 2023-08-19 DIAGNOSIS — K219 Gastro-esophageal reflux disease without esophagitis: Secondary | ICD-10-CM

## 2023-08-19 MED ORDER — OMEPRAZOLE 40 MG PO CPDR: 40.0000 mg | DELAYED_RELEASE_CAPSULE | Freq: Every day | ORAL | 1 refills | Status: DC

## 2023-08-19 NOTE — Telephone Encounter (Signed)
 Copied from CRM [id number]. Topic: Clinical - Medication Refill >> Aug 19, 2023  3:27 PM Ross Riley wrote: Medication: omeprazole  (PRILOSEC) 40 MG capsule  Has the patient contacted their pharmacy? Yes (Agent: If no, request that the patient contact the pharmacy for the refill. If patient does not wish to contact the pharmacy document the reason why and proceed with request.) (Agent: If yes, when and what did the pharmacy advise?)  This is the patient's preferred pharmacy:   [id number] [place], [place] - [address] AT [facility] [address] [place] [address] [place] [address] Phone: [phone] Fax: [phone]  Is this the correct pharmacy for this prescription? Yes If no, delete pharmacy and type the correct one.   Has the prescription been filled recently? Yes  Is the patient out of the medication? No. Has a week left   Has the patient been seen for an appointment in the last year OR does the patient have an upcoming appointment? Yes  Can we respond through MyChart? Yes  Agent: Please be advised that Rx refills may take up to 3 business days. We ask that you follow-up with your pharmacy.

## 2023-08-19 NOTE — Addendum Note (Signed)
 Addended by: Raina Bunting on: 08/19/2023 03:53 PM   Modules accepted: Orders

## 2023-08-27 ENCOUNTER — Ambulatory Visit: Payer: Self-pay

## 2023-08-27 NOTE — Telephone Encounter (Signed)
Will discuss at upcoming appointment tomorrow

## 2023-08-27 NOTE — Telephone Encounter (Signed)
 FYI Only or Action Required?: FYI only for provider.  Patient was last seen in primary care on 06/05/2023 by Edman Marsa PARAS, DO. Called Nurse Triage reporting Hematuria. Symptoms began yesterday. Interventions attempted: Nothing. Symptoms are: stable.  Triage Disposition: See Physician Within 24 Hours  Patient/caregiver understands and will follow disposition?: Yes  No appts today, scheduled OV tomorrow 300 with Angeline, NP. Advised if sx get worse before appt to go to UC/ED. Pt verbalized understanding.   Copied from CRM [id number]. Topic: Clinical - Red Word Triage >> Aug 27, 2023  8:58 AM Donna BRAVO wrote: Red Word that prompted transfer to Nurse Triage: patient calling, Spanish Interpreter Montie ID# [id number] patient has blood in urine starting last night Reason for Disposition  Blood in urine  (Exception: Could be normal menstrual bleeding.)  Answer Assessment - Initial Assessment Questions 1. COLOR of URINE: Describe the color of the urine.  (e.g., tea-colored, pink, red, bloody) Do you have blood clots in your urine? (e.g., none, pea, grape, small coin)     Bright red with clots  2. ONSET: When did the bleeding start?      Last night  3. EPISODES: How many times has there been blood in the urine? or How many times today?     3 4. PAIN with URINATION: Is there any pain with passing your urine? If Yes, ask: How bad is the pain?  (Scale 1-10; or mild, moderate, severe)    - MILD: Complains slightly about urination hurting.    - MODERATE: Interferes with normal activities.      - SEVERE: Excruciating, unwilling or unable to urinate because of the pain.      no 6. ASSOCIATED SYMPTOMS: Are you passing urine more frequently than usual?     no 7. OTHER SYMPTOMS: Do you have any other symptoms? (e.g., back/flank pain, abdomen pain, vomiting)     No  Had last year and was sent to ED  Protocols used: Urine - Blood In-A-AH

## 2023-08-28 ENCOUNTER — Encounter: Payer: Self-pay | Admitting: Internal Medicine

## 2023-08-28 ENCOUNTER — Ambulatory Visit (INDEPENDENT_AMBULATORY_CARE_PROVIDER_SITE_OTHER): Admitting: Internal Medicine

## 2023-08-28 VITALS — BP 120/74 | Ht 66.0 in | Wt 164.6 lb

## 2023-08-28 DIAGNOSIS — N400 Enlarged prostate without lower urinary tract symptoms: Secondary | ICD-10-CM

## 2023-08-28 DIAGNOSIS — R310 Gross hematuria: Secondary | ICD-10-CM | POA: Diagnosis not present

## 2023-08-28 LAB — POCT URINE DIPSTICK
Bilirubin, UA: NEGATIVE
Glucose, UA: >=1000 mg/dL — AB
Ketones, POC UA: NEGATIVE mg/dL
Leukocytes, UA: NEGATIVE
Nitrite, UA: NEGATIVE
POC PROTEIN,UA: NEGATIVE
Spec Grav, UA: 1.01 (ref 1.010–1.025)
Urobilinogen, UA: 0.2 U/dL
pH, UA: 6 (ref 5.0–8.0)

## 2023-08-28 NOTE — Patient Instructions (Signed)
 Blood in the Pee (Hematuria) in Adults: What to Know  Hematuria is blood in the pee. You may be able to see blood in the pee. In some cases, a health care provider may find blood with a test.  Blood in the pee can be caused by infections of the kidney, bladder, or the urethra. The urethra is the tube that drains pee from the bladder.  Other causes may include: Kidney stones. Infection of the prostate. Cancer. Too much calcium in the pee. Conditions that are passed from parent to child. Too much exercise. Infections can be treated with medicine. A kidney stone will usually leave your body when you pee. If infections or kidney stones didn't cause the blood in the urine, then more tests may be needed. It is very important to tell your provider about any blood in your pee, even if you have no pain or the blood stops with no treatment. Blood in the pee can be a sign of a very serious problem, such as cancer. Follow these instructions at home: Medicines Take your medicines only as told. If you were given antibiotics, take them as told. Do not stop taking them even if you start to feel better. Eating and drinking Drink more fluids as told. Aim to drink 3-4 quarts (2.8-3.8 L) a day. Avoid caffeine, tea, and carbonated drinks. These can bother the bladder. Avoid alcohol if a male because it may irritate the prostate. General instructions If you have been diagnosed with a kidney stone, strain your pee to catch the stone if told by your provider. Empty your bladder often. Avoid holding pee for a long time. If you're male, make sure that: You wipe from front to back after using the bathroom. You use each piece of toilet paper only once. You pee before and after sex. It's up to you to get the results of any tests. Ask when your results will be ready and how to get them. You may need to call or meet with your provider to get your results. Keep all follow-up visits. Your provider will need to know  about any changes or any new symptoms. Contact a health care provider if: Your symptoms don't get better after 3 days. Your symptoms get worse. You have back pain or belly pain. You have a fever or chills. You throw up or feel like you may throw up. You throw up every time you take medicine. Get help right away if: You pass blood clots in your pee. You pass out. These symptoms may be an emergency. Call 911 right away. Do not wait to see if the symptoms will go away. Do not drive yourself to the hospital. This information is not intended to replace advice given to you by your health care provider. Make sure you discuss any questions you have with your health care provider. Document Revised: 12/04/2022 Document Reviewed: 11/13/2022 Elsevier Patient Education  2024 [employer].

## 2023-08-28 NOTE — Progress Notes (Signed)
 Subjective:    Patient ID: Ross Riley, male    DOB: [birth year], 73 y.o.   MRN: [id number]  HPI  Discussed the use of AI scribe software for clinical note transcription with the patient, who gave verbal consent to proceed.  Ross Riley is a 73 year old male with an enlarged prostate who presents with blood in his urine.  He noticed hematuria on Wednesday at around 10:00 AM, occurring twice that day and twice again the following morning. Since then, he has not observed any more visible blood in his urine. No urgency, frequency dysuria, flank pain, or fever.  He has been following with a urologist for the past 18 months for this issue.  He is scheduled to follow up with the urologist later this month. Six months ago, he was informed by the urologist that the hematuria was likely related to his enlarged prostate.  He was started on finasteride  for his BPH.  A cystoscopy was normal.       Review of Systems   Past Medical History:  Diagnosis Date   Arthritis    BPH (benign prostatic hyperplasia)    Chronic kidney disease    kidney stones   Dystonia    ED (erectile dysfunction)    GERD (gastroesophageal reflux disease)    Glaucoma (increased eye pressure)    Hematuria    Hemorrhoids    Hyperlipidemia    Hypertension    Thyroid  disease     Current Outpatient Medications  Medication Sig Dispense Refill   ACCU-CHEK AVIVA PLUS test strip Use to check blood sugar up to 1 x per day 100 each 12   Accu-Chek Softclix Lancets lancets Use to check blood sugar up to 1 x per day 100 each 12   aspirin  EC 81 MG tablet Take 81 mg by mouth daily. Swallow whole.     atorvastatin  (LIPITOR ) 80 MG tablet Take 1 tablet (80 mg total) by mouth at bedtime. 90 tablet 3   Blood Glucose Monitoring Suppl (ACCU-CHEK AVIVA PLUS) w/Device KIT Use to check blood sugar up to 1 x per day 1 kit 0   carvedilol  (COREG ) 6.25 MG tablet Take 1 tablet (6.25 mg total) by mouth 2 (two) times daily with a  meal. 60 tablet 3   dorzolamide -timolol  (COSOPT ) 22.3-6.8 MG/ML ophthalmic solution Place 1 drop into both eyes 2 (two) times daily.     empagliflozin  (JARDIANCE ) 10 MG TABS tablet Take 1 tablet (10 mg total) by mouth daily. (Patient not taking: Reported on 07/07/2023) 30 tablet 2   ezetimibe  (ZETIA ) 10 MG tablet Take 1 tablet (10 mg total) by mouth daily. 90 tablet 2   finasteride  (PROSCAR ) 5 MG tablet Take 1 tablet (5 mg total) by mouth daily. 90 tablet 3   folic acid  (FOLVITE ) 1 MG tablet Take 1 tablet (1 mg total) by mouth daily. 30 tablet 2   gabapentin  (NEURONTIN ) 600 MG tablet Take 1 tablet (600 mg total) by mouth 3 (three) times daily. 270 tablet 3   isosorbide  mononitrate (IMDUR ) 30 MG 24 hr tablet TAKE 1/2 TABLET(15 MG) BY MOUTH DAILY 45 tablet 3   JARDIANCE  25 MG TABS tablet Take 25 mg by mouth daily.     latanoprost  (XALATAN ) 0.005 % ophthalmic solution Place 1 drop into both eyes at bedtime.  6   levothyroxine  (SYNTHROID ) 75 MCG tablet Take 75 mcg by mouth daily before breakfast.     Multiple Vitamin (MULTIVITAMIN WITH MINERALS) TABS tablet Take 1 tablet  by mouth daily. 30 tablet 2   nitroGLYCERIN  (NITROSTAT ) 0.4 MG SL tablet Place 1 tablet (0.4 mg total) under the tongue every 5 (five) minutes as needed for chest pain. 25 tablet 2   omeprazole  (PRILOSEC) 40 MG capsule Take 1 capsule (40 mg total) by mouth daily before breakfast. 90 capsule 1   sacubitril -valsartan  (ENTRESTO ) 49-51 MG Take 1 tablet by mouth 2 (two) times daily. 60 tablet 2   spironolactone  (ALDACTONE ) 25 MG tablet Take 1 tablet (25 mg total) by mouth daily. 90 tablet 3   ticagrelor  (BRILINTA ) 90 MG TABS tablet TAKE 1 TABLET(90 MG) BY MOUTH TWICE DAILY 60 tablet 11   No current facility-administered medications for this visit.    Allergies  Allergen Reactions   Metformin And Related Other (See Comments), Hives and Rash    Headache and dizzines  Headache and dizzines    headache and dizziness   Metformin Hcl  Other (See Comments)    headache and dizziness    Family History  Problem Relation Age of Onset   Heart disease Mother    Hypertension Brother     Social History   Socioeconomic History   Marital status: Married    Spouse name: Not on file   Number of children: Not on file   Years of education: Not on file   Highest education level: 9th grade  Occupational History   Occupation: retired  Tobacco Use   Smoking status: Former    Current packs/day: 0.00    Average packs/day: 1 pack/day for 32.0 years (32.0 ttl pk-yrs)    Types: Cigarettes    Start date: 03/04/1959    Quit date: 03/04/1991    Years since quitting: 32.5   Smokeless tobacco: Former  Building services engineer status: Never Used  Substance and Sexual Activity   Alcohol use: Yes    Alcohol/week: 7.0 standard drinks of alcohol    Types: 7 Shots of liquor per week    Comment: 1-2 shots of vodka daily   Drug use: No   Sexual activity: Yes    Birth control/protection: None  Other Topics Concern   Not on file  Social History Narrative   Not on file   Social Drivers of Health   Financial Resource Strain: Low Risk  (08/13/2023)   Overall Financial Resource Strain (CARDIA)    Difficulty of Paying Living Expenses: Not hard at all  Food Insecurity: No Food Insecurity (08/13/2023)   Hunger Vital Sign    Worried About Running Out of Food in the Last Year: Never true    Ran Out of Food in the Last Year: Never true  Transportation Needs: No Transportation Needs (08/13/2023)   PRAPARE - Administrator, Civil Service (Medical): No    Lack of Transportation (Non-Medical): No  Physical Activity: Inactive (08/13/2023)   Exercise Vital Sign    Days of Exercise per Week: 0 days    Minutes of Exercise per Session: 0 min  Stress: No Stress Concern Present (08/13/2023)   [employer] of Occupational Health - Occupational Stress Questionnaire    Feeling of Stress: Not at all  Social Connections: Socially Integrated  (08/13/2023)   Social Connection and Isolation Panel    Frequency of Communication with Friends and Family: More than three times a week    Frequency of Social Gatherings with Friends and Family: More than three times a week    Attends Religious Services: More than 4 times per year  Active Member of Clubs or Organizations: Yes    Attends Banker Meetings: More than 4 times per year    Marital Status: Married  Recent Concern: Social Connections - Socially Isolated (07/06/2023)   Social Connection and Isolation Panel    Frequency of Communication with Friends and Family: Never    Frequency of Social Gatherings with Friends and Family: Never    Attends Religious Services: Never    Database administrator or Organizations: No    Attends Banker Meetings: Never    Marital Status: Married  Catering manager Violence: Not At Risk (08/13/2023)   Humiliation, Afraid, Rape, and Kick questionnaire    Fear of Current or Ex-Partner: No    Emotionally Abused: No    Physically Abused: No    Sexually Abused: No     Constitutional: Denies fever, malaise, fatigue, headache or abrupt weight changes.  Respiratory: Denies difficulty breathing, shortness of breath, cough or sputum production.   Cardiovascular: Denies chest pain, chest tightness, palpitations or swelling in the hands or feet.  Gastrointestinal: Denies abdominal pain, bloating, constipation, diarrhea or blood in the stool.  GU: Patient reports blood in urine.  Denies urgency, frequency, pain with urination, burning sensation, odor or discharge.  No other specific complaints in a complete review of systems (except as listed in HPI above).      Objective:   Physical Exam BP 120/74 (BP Location: Left Arm, Patient Position: Sitting, Cuff Size: Normal)   Ht 5' 6 (1.676 m)   Wt 164 lb 9.6 oz (74.7 kg)   BMI 26.57 kg/m   Wt Readings from Last 3 Encounters:  08/13/23 166 lb (75.3 kg)  07/06/23 165 lb (74.8 kg)   07/01/23 166 lb 9.6 oz (75.6 kg)    General: Appears his stated age, overweight, in NAD. Cardiovascular: Normal rate and rhythm. Pulmonary/Chest: Normal effort and positive vesicular breath sounds. No respiratory distress. No wheezes, rales or ronchi noted.  Abdomen: Soft and nontender. No CVA tenderness noted.  Neurological: Alert and oriented.   BMET    Component Value Date/Time   NA 144 07/01/2023 1548   NA 139 12/07/2012 1602   K 5.1 07/01/2023 1548   K 3.3 (L) 12/07/2012 1602   CL 106 07/01/2023 1548   CL 104 12/07/2012 1602   CO2 20 07/01/2023 1548   CO2 27 12/07/2012 1602   GLUCOSE 143 (H) 07/01/2023 1548   GLUCOSE 197 (H) 06/10/2023 1456   GLUCOSE 132 (H) 12/07/2012 1602   BUN 19 07/01/2023 1548   BUN 11 12/07/2012 1602   CREATININE 1.08 07/01/2023 1548   CREATININE 0.87 04/27/2023 1406   CALCIUM  9.8 07/01/2023 1548   CALCIUM  8.9 12/07/2012 1602   GFRNONAA >60 06/10/2023 1456   GFRNONAA 72 11/09/2019 0802   GFRAA 83 11/09/2019 0802    Lipid Panel     Component Value Date/Time   CHOL 117 07/01/2023 1548   TRIG 157 (H) 07/01/2023 1548   HDL 46 07/01/2023 1548   CHOLHDL 2.5 07/01/2023 1548   CHOLHDL 3.1 04/27/2023 1406   VLDL 33 12/20/2022 1009   LDLCALC 45 07/01/2023 1548   LDLCALC 69 04/27/2023 1406    CBC    Component Value Date/Time   WBC 5.4 06/23/2023 1436   WBC 4.8 04/27/2023 1406   RBC 5.35 06/23/2023 1436   RBC 5.29 04/27/2023 1406   HGB 15.3 06/23/2023 1436   HCT 48.2 06/23/2023 1436   PLT 227 06/23/2023 1436   MCV  90 06/23/2023 1436   MCV 90 12/07/2012 1602   MCH 28.6 06/23/2023 1436   MCH 29.5 04/27/2023 1406   MCHC 31.7 06/23/2023 1436   MCHC 32.5 04/27/2023 1406   RDW 12.7 06/23/2023 1436   RDW 12.3 12/07/2012 1602   LYMPHSABS 2.9 06/23/2023 1436   MONOABS 0.6 06/18/2022 0600   EOSABS 0.2 06/23/2023 1436   BASOSABS 0.0 06/23/2023 1436    Hgb A1C Lab Results  Component Value Date   HGBA1C 7.6 (H) 04/27/2023             Assessment & Plan:   Assessment and Plan    Hematuria Intermittent hematuria likely due to anticoagulation with Brilinta  and aspirin . Enlarged prostate identified as potential source. UTI less likely without symptoms. Informed about bleeding risk with aspirin  and Brilinta . -Urinalysis shows small blood - Send urine for culture. - Advise increased fluid intake. - Hold aspirin . - Continue Brilinta . - Follow up with urologist as scheduled.  Benign Prostatic Hyperplasia (BPH) Enlarged prostate managed with finasteride . Previous cystoscopy unremarkable. Urology follow-up scheduled to assess treatment response. -Continue finasteride  as previously scheduled - Follow up with urologist on June 21.    Follow-up with your PCP as previously scheduled Angeline Laura, NP

## 2023-08-29 LAB — URINE CULTURE
MICRO NUMBER:: 16634800
Result:: NO GROWTH
SPECIMEN QUALITY:: ADEQUATE

## 2023-08-31 ENCOUNTER — Ambulatory Visit: Payer: Self-pay | Admitting: Internal Medicine

## 2023-09-01 ENCOUNTER — Other Ambulatory Visit: Payer: Self-pay

## 2023-09-01 MED ORDER — ENTRESTO 49-51 MG PO TABS: 1.0000 | ORAL_TABLET | Freq: Two times a day (BID) | ORAL | 5 refills | Status: DC

## 2023-09-15 DIAGNOSIS — E119 Type 2 diabetes mellitus without complications: Secondary | ICD-10-CM | POA: Diagnosis not present

## 2023-09-15 DIAGNOSIS — E1169 Type 2 diabetes mellitus with other specified complication: Secondary | ICD-10-CM | POA: Diagnosis not present

## 2023-09-15 DIAGNOSIS — E785 Hyperlipidemia, unspecified: Secondary | ICD-10-CM | POA: Diagnosis not present

## 2023-09-15 DIAGNOSIS — I152 Hypertension secondary to endocrine disorders: Secondary | ICD-10-CM | POA: Diagnosis not present

## 2023-09-15 DIAGNOSIS — E039 Hypothyroidism, unspecified: Secondary | ICD-10-CM | POA: Diagnosis not present

## 2023-09-15 DIAGNOSIS — E1159 Type 2 diabetes mellitus with other circulatory complications: Secondary | ICD-10-CM | POA: Diagnosis not present

## 2023-09-15 LAB — HEMOGLOBIN A1C: Hemoglobin A1C: 8.1

## 2023-09-21 ENCOUNTER — Ambulatory Visit: Payer: Self-pay | Admitting: Urology

## 2023-09-21 VITALS — BP 106/69 | HR 71

## 2023-09-21 DIAGNOSIS — R310 Gross hematuria: Secondary | ICD-10-CM | POA: Diagnosis not present

## 2023-09-21 DIAGNOSIS — N401 Enlarged prostate with lower urinary tract symptoms: Secondary | ICD-10-CM

## 2023-09-21 LAB — MICROSCOPIC EXAMINATION
Bacteria, UA: NONE SEEN
Epithelial Cells (non renal): NONE SEEN /HPF (ref 0–10)

## 2023-09-21 LAB — URINALYSIS, COMPLETE
Bilirubin, UA: NEGATIVE
Ketones, UA: NEGATIVE
Leukocytes,UA: NEGATIVE
Nitrite, UA: NEGATIVE
Protein,UA: NEGATIVE
Specific Gravity, UA: 1.01 (ref 1.005–1.030)
Urobilinogen, Ur: 0.2 mg/dL (ref 0.2–1.0)
pH, UA: 6 (ref 5.0–7.5)

## 2023-09-21 NOTE — Progress Notes (Signed)
 09/21/2023 2:35 PM   Ross Riley Apr 02, 1950 [id number]  Referring provider: Edman Marsa JINNY, DO [address] [place],  [place] [address]  Chief Complaint  Patient presents with   Follow-up   Urologic history: 1.  Gross hematuria Episode gross hematuria clot retention 02/2023 CT no upper tract abnormalities Prostate volume calculated 64 cc Cystoscopy prominent lateral lobe enlargement/adenoma regrowth Started finasteride  03/2023  2.  BPH with LUTS Prior TURP Dr. Chauncey 2016   HPI: Ross Riley is a 73 y.o. male who presents for a 93-month follow-up visit.  A Spanish interpreter was present via video link.  Since last visit had 1 episode of hematuria with visible blood after 3 voids then resolved Stable urinary frequency Remains on finasteride   PMH: Past Medical History:  Diagnosis Date   Arthritis    BPH (benign prostatic hyperplasia)    Chronic kidney disease    kidney stones   Dystonia    ED (erectile dysfunction)    GERD (gastroesophageal reflux disease)    Glaucoma (increased eye pressure)    Hematuria    Hemorrhoids    Hyperlipidemia    Hypertension    Stroke (HCC)    Thyroid  disease     Surgical History: Past Surgical History:  Procedure Laterality Date   CATARACT EXTRACTION Bilateral    CORONARY/GRAFT ACUTE MI REVASCULARIZATION N/A 12/20/2022   Procedure: Coronary/Graft Acute MI Revascularization;  Surgeon: Darron Deatrice LABOR, MD;  Location: [facility] INVASIVE CV LAB;  Service: Cardiovascular;  Laterality: N/A;   EXTRACORPOREAL SHOCK WAVE LITHOTRIPSY Right    EYE SURGERY     ICD IMPLANT N/A 07/06/2023   Procedure: ICD IMPLANT;  Surgeon: Kennyth Chew, MD;  Location: [facility] INVASIVE CV LAB;  Service: Cardiovascular;  Laterality: N/A;   JOINT REPLACEMENT Left 08/2016   left knee, [place]   KIDNEY STONE SURGERY  2009   LEFT HEART CATH AND CORONARY ANGIOGRAPHY N/A 12/20/2022   Procedure: LEFT HEART CATH AND CORONARY ANGIOGRAPHY;  Surgeon: Darron Deatrice LABOR, MD;  Location: [facility] INVASIVE CV LAB;  Service: Cardiovascular;  Laterality: N/A;   PROSTATE SURGERY  2011   TRANSURETHRAL RESECTION OF PROSTATE N/A 12/06/2014   Procedure: TRANSURETHRAL RESECTION OF THE PROSTATE (TURP);  Surgeon: Redell Lynwood Chauncey, MD;  Location: [facility] ORS;  Service: Urology;  Laterality: N/A;    Home Medications:  Allergies as of 09/21/2023       Reactions   Metformin And Related Other (See Comments), Hives, Rash   Headache and dizzines Headache and dizzines    headache and dizziness   Metformin Hcl Other (See Comments)   headache and dizziness        Medication List        Accurate as of September 21, 2023  2:35 PM. If you have any questions, ask your nurse or doctor.          Accu-Chek Aviva Plus test strip Generic drug: glucose blood Use to check blood sugar up to 1 x per day   Accu-Chek Aviva Plus w/Device Kit Use to check blood sugar up to 1 x per day   Accu-Chek Softclix Lancets lancets Use to check blood sugar up to 1 x per day   aspirin  EC 81 MG tablet Take 81 mg by mouth daily. Swallow whole.   atorvastatin  80 MG tablet Commonly known as: LIPITOR  Take 1 tablet (80 mg total) by mouth at bedtime.   Brilinta  90 MG Tabs tablet Generic drug: ticagrelor  TAKE 1 TABLET(90 MG) BY MOUTH  TWICE DAILY   carvedilol  6.25 MG tablet Commonly known as: COREG  Take 1 tablet (6.25 mg total) by mouth 2 (two) times daily with a meal.   dorzolamide -timolol  2-0.5 % ophthalmic solution Commonly known as: COSOPT  Place 1 drop into both eyes 2 (two) times daily.   empagliflozin  10 MG Tabs tablet Commonly known as: JARDIANCE  Tome 1 tableta (10 mg en total) por va oral diariamente. (Take 1 tablet (10 mg total) by mouth daily.)   Jardiance  25 MG Tabs tablet Generic drug: empagliflozin  Take 25 mg by mouth daily.   Entresto  49-51 MG Generic drug: sacubitril -valsartan  Take 1 tablet by mouth 2 (two) times daily.   ezetimibe  10 MG tablet Commonly  known as: Zetia  Take 1 tablet (10 mg total) by mouth daily.   finasteride  5 MG tablet Commonly known as: PROSCAR  Take 1 tablet (5 mg total) by mouth daily.   folic acid  1 MG tablet Commonly known as: FOLVITE  Tome 1 tableta (1 mg en total) por va oral diariamente. (Take 1 tablet (1 mg total) by mouth daily.)   gabapentin  600 MG tablet Commonly known as: Neurontin  Take 1 tablet (600 mg total) by mouth 3 (three) times daily.   isosorbide  mononitrate 30 MG 24 hr tablet Commonly known as: IMDUR  TAKE 1/2 TABLET(15 MG) BY MOUTH DAILY   latanoprost  0.005 % ophthalmic solution Commonly known as: XALATAN  Place 1 drop into both eyes at bedtime.   levothyroxine  75 MCG tablet Commonly known as: SYNTHROID  Take 75 mcg by mouth daily before breakfast.   multivitamin with minerals Tabs tablet Tome 1 tableta por va oral diariamente. (Take 1 tablet by mouth daily.)   nitroGLYCERIN  0.4 MG SL tablet Commonly known as: NITROSTAT  Place 1 tablet (0.4 mg total) under the tongue every 5 (five) minutes as needed for chest pain.   omeprazole  40 MG capsule Commonly known as: PRILOSEC Take 1 capsule (40 mg total) by mouth daily before breakfast.   spironolactone  25 MG tablet Commonly known as: ALDACTONE  Take 1 tablet (25 mg total) by mouth daily.        Allergies:  Allergies  Allergen Reactions   Metformin And Related Other (See Comments), Hives and Rash    Headache and dizzines  Headache and dizzines    headache and dizziness   Metformin Hcl Other (See Comments)    headache and dizziness    Family History: Family History  Problem Relation Age of Onset   Heart disease Mother    Arthritis Mother    Hypertension Brother    Diabetes Daughter     Social History:  reports that he quit smoking about 32 years ago. His smoking use included cigarettes. He started smoking about 64 years ago. He has a 32 pack-year smoking history. He has quit using smokeless tobacco. He reports current  alcohol use of about 7.0 standard drinks of alcohol per week. He reports that he does not use drugs.   Physical Exam: BP 106/69   Pulse 71   Constitutional:  Alert, No acute distress. HEENT: [place] AT Respiratory: Normal respiratory effort, no increased work of breathing. Psychiatric: Normal mood and affect.    Assessment & Plan:    1. Gross hematuria (Primary) Secondary to BPH 1 episode in the last 6 months We discussed continuing finasteride  versus outlet procedure.  Based on prostate size feel HoLEP would be his best option He would like to continue medical management.  Will have him follow-up for a PA visit in 6 months  2.  BPH  with LUTS Continue finasteride    Glendia JAYSON Barba, MD  [facility] [address], [address] [place], [place] [address] [phone]

## 2023-09-22 ENCOUNTER — Other Ambulatory Visit: Payer: Self-pay | Admitting: Family Medicine

## 2023-09-22 NOTE — Telephone Encounter (Signed)
 Last OV: 08/28/2023

## 2023-09-22 NOTE — Telephone Encounter (Signed)
 Copied from CRM [id number]. Topic: Clinical - Medication Refill >> Sep 22, 2023  1:06 PM Sophia H wrote: Medication: sacubitril -valsartan  (ENTRESTO ) 49-51 MG   Has the patient contacted their pharmacy? Yes, did not pick up due to rx being written wrong   This is the patient's preferred pharmacy:  [id number] [place], [place] - [address] AT [facility] [address] [address] [place] [address] Phone: [phone] Fax: [phone]   Is this the correct pharmacy for this prescription? Yes If no, delete pharmacy and type the correct one.   Has the prescription been filled recently? Yes  Is the patient out of the medication? Yes  Has the patient been seen for an appointment in the last year OR does the patient have an upcoming appointment? Yes  Can we respond through MyChart? Yes  Agent: Please be advised that Rx refills may take up to 3 business days. We ask that you follow-up with your pharmacy.  **Was being filled by cardiologist Dr. Rolan but needing to know if pcp can fill. RX needs to be written for 2 tabs in the AM and 2 in the PM per cardiologist request at last visit. Please advise if unable to fill

## 2023-09-24 ENCOUNTER — Telehealth: Payer: Self-pay | Admitting: Cardiology

## 2023-09-24 MED ORDER — SACUBITRIL-VALSARTAN 49-51 MG PO TABS: 1.0000 | ORAL_TABLET | Freq: Two times a day (BID) | ORAL | 5 refills | Status: DC

## 2023-09-24 NOTE — Telephone Encounter (Signed)
 Pt is out of Entresto , please send refill to [facility] in [place], pt sch f/u appt 10/13/23 w/DM

## 2023-09-24 NOTE — Progress Notes (Signed)
Entresto refills sent to pharmacy

## 2023-09-24 NOTE — Addendum Note (Signed)
 Addended by: Rennee Coyne A on: 09/24/2023 04:35 PM   Modules accepted: Orders

## 2023-09-24 NOTE — Telephone Encounter (Signed)
 Requested medications are due for refill today.  See note  Requested medications are on the active medications list.  yes  Last refill. 09/01/2023  Future visit scheduled.   yes  Notes to clinic.  **Was being filled by cardiologist Dr. Rolan but needing to know if pcp can fill. RX needs to be written for 2 tabs in the AM and 2 in the PM per cardiologist request at last visit. Please advise if unable to fill    Requested Prescriptions  Pending Prescriptions Disp Refills   sacubitril -valsartan  (ENTRESTO ) 49-51 MG 60 tablet 5    Sig: Take 1 tablet by mouth 2 (two) times daily.     Off-Protocol Failed - 09/24/2023  2:24 PM      Failed - Medication not assigned to a protocol, review manually.      Passed - Valid encounter within last 12 months    Recent Outpatient Visits           3 weeks ago Gross hematuria   [place] [facility] Ross Riley, Ross Riley ORN, NP   3 months ago Right arm pain   [place] [facility] Edman Marsa PARAS, Ross Riley   5 months ago Annual physical exam   [place] [facility] Edman Marsa PARAS, Ross Riley       Future Appointments             In 1 month Edman, Marsa PARAS, Ross Riley [place] [facility]   In 5 months Ross Riley, [facility] [place]

## 2023-09-25 ENCOUNTER — Other Ambulatory Visit: Payer: Self-pay | Admitting: Cardiology

## 2023-09-26 ENCOUNTER — Encounter: Payer: Self-pay | Admitting: Urology

## 2023-10-05 DIAGNOSIS — Z95810 Presence of automatic (implantable) cardiac defibrillator: Secondary | ICD-10-CM | POA: Insufficient documentation

## 2023-10-05 NOTE — Progress Notes (Unsigned)
 Electrophysiology Clinic Note    Date:  10/06/2023  Patient ID:  Ross Riley, DOB 05/26/1950, MRN [id number] PCP:  Edman Marsa PARAS, DO  Cardiologist:  Evalene Lunger, MD HF cardiologist: Rolan  Electrophysiologist:  Fonda Kitty, MD  Electrophysiology APP:  Sherrol Vicars, NP    Discussed the use of AI scribe software for clinical note transcription with the patient, who gave verbal consent to proceed.   Patient Profile    Chief Complaint: ICD implant follow-up  History of Present Illness: Ross Riley is a 73 y.o. male with PMH notable for ICM, HFrEF s/p ICD, CAD, T2DM, HTN, HLD; seen today for Fonda Kitty, MD for routine electrophysiology follow-up s/p Defibrillator implant. He is s/p dual chamber ICD implant 07/2023.  On follow-up today, he overall feels well. Does not believe he has extra fluid. Denies chest pain, chest pressure, palpitations. His ICD site is healing well, no tenderness or pain. The generator does protrude from his chest more than he thought it would. He questions what to do with his home monitor when he travels, planning to travel for many months out of country.    Arrhythmia/Device History Bos Sci dual chamber ICD, imp 07/2023; dx HFrEF    ROS:  Please see the history of present illness. All other systems are reviewed and otherwise negative.    Physical Exam    VS:  BP 110/60 (BP Location: Left Arm, Patient Position: Sitting, Cuff Size: Normal)   Pulse 60   Ht 5' 1 (1.549 m)   Wt 165 lb 9.6 oz (75.1 kg)   SpO2 98%   BMI 31.29 kg/m  BMI: Body mass index is 31.29 kg/m.      Wt Readings from Last 3 Encounters:  10/06/23 165 lb 9.6 oz (75.1 kg)  08/28/23 164 lb 9.6 oz (74.7 kg)  08/13/23 166 lb (75.3 kg)     GEN- The patient is well appearing, alert and oriented x 3 today.   Lungs- Clear to ausculation bilaterally, normal work of breathing.  Heart- Regular rate and rhythm, no murmurs, rubs or gallops Extremities- No  peripheral edema, warm, dry Skin-  device pocket well-healed, no tethering   Device interrogation done today and reviewed by myself:  Battery 12.5 years Lead thresholds, impedence, sensing stable  No episodes No changes made today   Studies Reviewed   Previous EP, cardiology notes.    EKG is ordered. Personal review of EKG from today shows:    EKG Interpretation Date/Time:  Tuesday October 06 2023 13:39:28 EDT Ventricular Rate:  60 PR Interval:  176 QRS Duration:  90 QT Interval:  424 QTC Calculation: 424 R Axis:   -59  Text Interpretation: Atrial-paced rhythm Left axis deviation Inferior infarct , age undetermined Anterior infarct (cited on or before 03-Jun-2023) Confirmed by Nelly Scriven [phone]) on 10/06/2023 2:01:41 PM    Long term monitor, 07/14/2023 Patch Wear Time:  1 days and 9 hours (2025-05-01T04:29:58-0400 to 2025-05-02T13:44:27-0400)   Patient had a min HR of 42 bpm, max HR of 235 bpm, and avg HR of 54 bpm. Predominant underlying rhythm was Sinus Rhythm. 1 run of Supraventricular Tachycardia occurred lasting 5 beats with a max rate of 235 bpm (avg 170 bpm). Isolated SVEs were rare  (<1.0%), SVE Couplets were rare (<1.0%), and no SVE Triplets were present. Isolated VEs were rare (<1.0%), and no VE Couplets or VE Triplets were present. Inverted QRS complexes possibly due to inverted placement of device.   1. Predominantly  NSR. 2. 1 run of SVT x 5 beats.   Limited TTE, 05/29/2023  1. Left ventricular ejection fraction, by estimation, is 30 to 35%. The left ventricle has moderate to severely decreased function. The left ventricle demonstrates regional wall motion abnormalities (see scoring diagram/findings for description). There is akinesis of the left ventricular, entire anteroseptal wall. The average left ventricular global longitudinal strain is -14.6 %. The global longitudinal strain is abnormal.   2. Right ventricular systolic function is low normal. The right  ventricular size is normal.   3. The mitral valve is normal in structure. Mild mitral valve regurgitation.   4. Aortic valve regurgitation is mild to moderate.   TTE, 12/20/2022  1. Left ventricular ejection fraction, by estimation, is 35 to 40%. The left ventricle has moderately decreased function. The left ventricle demonstrates regional wall motion abnormalities (see scoring diagram/findings for description). There is mild left ventricular hypertrophy. Left ventricular diastolic parameters are indeterminate. There is akinesis of the left ventricular, entire anteroseptal wall and apical segment.   2. Right ventricular systolic function is normal. The right ventricular size is normal.   3. The mitral valve is normal in structure. No evidence of mitral valve regurgitation.   4. The aortic valve is grossly normal. Aortic valve regurgitation is  mild.    Assessment and Plan     #) ICM s/p ICD ICD functioning well, see paceart for details No arrhythmias noted Low VP Recommended he bring bedside monitor with him when traveling greater than 2 weeks, but ok to leave at home if travelling overnight  #) HFrEF Appears euvolemic on exam Continue 6.25mg  coreg , 10mg  jardiance , 49-51 entresto , 25mg  spiro Has appt next week with Dr. Rolan      Current medicines are reviewed at length with the patient today.   The patient does not have concerns regarding his medicines.  The following changes were made today:  none  Labs/ tests ordered today include:  Orders Placed This Encounter  Procedures   EKG 12-Lead     Disposition: Follow up with Dr. Kennyth or EP APP in 12 months, continue remote monitoring every 3 months   Signed, Chantal Needle, NP  10/06/23  3:07 PM  Electrophysiology [facility]

## 2023-10-06 ENCOUNTER — Ambulatory Visit: Attending: Cardiology | Admitting: Cardiology

## 2023-10-06 VITALS — BP 110/60 | HR 60 | Ht 61.0 in | Wt 165.6 lb

## 2023-10-06 DIAGNOSIS — I5022 Chronic systolic (congestive) heart failure: Secondary | ICD-10-CM | POA: Diagnosis not present

## 2023-10-06 DIAGNOSIS — I255 Ischemic cardiomyopathy: Secondary | ICD-10-CM

## 2023-10-06 DIAGNOSIS — Z95810 Presence of automatic (implantable) cardiac defibrillator: Secondary | ICD-10-CM

## 2023-10-06 LAB — CUP PACEART INCLINIC DEVICE CHECK
Date Time Interrogation Session: 20250805152023
HighPow Impedance: 72 Ohm
Implantable Lead Connection Status: 753985
Implantable Lead Connection Status: 753985
Implantable Lead Implant Date: 20250505
Implantable Lead Implant Date: 20250505
Implantable Lead Location: 753859
Implantable Lead Location: 753860
Implantable Lead Model: 673
Implantable Lead Model: 7841
Implantable Lead Serial Number: 560644
Implantable Lead Serial Number: 7463987
Implantable Pulse Generator Implant Date: 20250505
Lead Channel Impedance Value: 616 Ohm
Lead Channel Impedance Value: 857 Ohm
Lead Channel Pacing Threshold Amplitude: 0.4 V
Lead Channel Pacing Threshold Amplitude: 1.2 V
Lead Channel Pacing Threshold Pulse Width: 0.4 ms
Lead Channel Pacing Threshold Pulse Width: 0.4 ms
Lead Channel Sensing Intrinsic Amplitude: 13.1 mV
Lead Channel Sensing Intrinsic Amplitude: 8.4 mV
Lead Channel Setting Pacing Amplitude: 3.5 V
Lead Channel Setting Pacing Amplitude: 3.5 V
Lead Channel Setting Pacing Pulse Width: 0.4 ms
Lead Channel Setting Sensing Sensitivity: 0.6 mV
Pulse Gen Serial Number: 695502
Zone Setting Status: 755011

## 2023-10-06 NOTE — Patient Instructions (Signed)
 Medication Instructions:  The current medical regimen is effective;  continue present plan and medications.  *If you need a refill on your cardiac medications before your next appointment, please call your pharmacy*  Follow-Up: At [facility], you and your health needs are our priority.  As part of our continuing mission to provide you with exceptional heart care, our providers are all part of one team.  This team includes your primary Cardiologist (physician) and Advanced Practice Providers or APPs (Physician Assistants and Nurse Practitioners) who all work together to provide you with the care you need, when you need it.  Your next appointment:   12 month(s)  Provider:   Suzann Riddle, NP    We recommend signing up for the patient portal called MyChart.  Sign up information is provided on this After Visit Summary.  MyChart is used to connect with patients for Virtual Visits (Telemedicine).  Patients are able to view lab/test results, encounter notes, upcoming appointments, etc.  Non-urgent messages can be sent to your provider as well.   To learn more about what you can do with MyChart, go to [email or web address].

## 2023-10-12 ENCOUNTER — Telehealth: Payer: Self-pay

## 2023-10-12 NOTE — Telephone Encounter (Signed)
 Dr Rolan and Suzann,  I am routing this phone call to you for review. It looks like this mutual patient has been on Entresto , I see a past dosage of 24/26 TWICE A DAY dosing. However more recently it looks like the dose was increased to 49-51 mg TWICE A DAY.  I believe there is some confusion on his dosing here - because the phone call we received says the patient is doubling the 49-51 mg dose for two of those tabs twice a day and is running out twice as fast.  They called us  since my name was on the last refill, as the refill team got a request on this med and it was filled under my name. I was not aware of that refill actually.   Can you or your office confirm the correct dose and contact patient to ensure that they are taking it properly? I see your most recent order 49-51 mg 1 pill TWICE a day - but I think they were doubling it so, they may need to review the proper dosing once more.  Marsa Officer, DO [facility] 10/12/2023, 5:22 PM

## 2023-10-12 NOTE — Telephone Encounter (Signed)
 Copied from CRM [id number]. Topic: Clinical - Prescription Issue >> Oct 12, 2023  1:01 PM Tobias L wrote: Reason for CRM: Daugther, [name] states Dr. Vinie prescribed patient sacubitril -valsartan  (ENTRESTO ) 49-51 MG but dosage  was changed to two tablets in the morning and two in the afternoon.   Requesting new prescription to be written to adjust  for change in medication. Daughter states she has reached out to Cardiologist, Dr. Ezra Shuck to adjust prescription but the prescription is being sent to pharmacy with instructions take 1 tablet by mouth 2 (two) times daily. and patient is running out.   Daughter called a month ago to have prescription adjusted and it never was, daughter states they have a bottle with Dr. Edman name on it as the prescriber.   Daughter reqeusting prescription be adjusted and sent to pharmacy.

## 2023-10-13 ENCOUNTER — Encounter: Payer: Self-pay | Admitting: Cardiology

## 2023-10-13 ENCOUNTER — Other Ambulatory Visit
Admission: RE | Admit: 2023-10-13 | Discharge: 2023-10-13 | Disposition: A | Discharge status: Home / Self Care | Source: Ambulatory Visit | Attending: Cardiology | Admitting: Cardiology

## 2023-10-13 ENCOUNTER — Ambulatory Visit (HOSPITAL_BASED_OUTPATIENT_CLINIC_OR_DEPARTMENT_OTHER): Admitting: Cardiology

## 2023-10-13 VITALS — BP 101/70 | HR 63 | Wt 164.6 lb

## 2023-10-13 DIAGNOSIS — I5022 Chronic systolic (congestive) heart failure: Secondary | ICD-10-CM

## 2023-10-13 LAB — BASIC METABOLIC PANEL WITH GFR
Anion gap: 12 (ref 5–15)
BUN: 26 mg/dL — ABNORMAL HIGH (ref 8–23)
CO2: 25 mmol/L (ref 22–32)
Calcium: 9.9 mg/dL (ref 8.9–10.3)
Chloride: 106 mmol/L (ref 98–111)
Creatinine, Ser: 1.36 mg/dL — ABNORMAL HIGH (ref 0.61–1.24)
GFR, Estimated: 55 mL/min — ABNORMAL LOW (ref >60)
Glucose, Bld: 134 mg/dL — ABNORMAL HIGH (ref 70–99)
Potassium: 5.4 mmol/L — ABNORMAL HIGH (ref 3.5–5.1)
Sodium: 143 mmol/L (ref 135–145)

## 2023-10-13 LAB — BRAIN NATRIURETIC PEPTIDE: B Natriuretic Peptide: 28 pg/mL (ref 0.0–100.0)

## 2023-10-13 MED ORDER — SACUBITRIL-VALSARTAN 49-51 MG PO TABS: 1.0000 | ORAL_TABLET | Freq: Two times a day (BID) | ORAL | 5 refills | Status: DC

## 2023-10-13 NOTE — Patient Instructions (Signed)
 Medication Changes:  STOP Aspirin   CONTINUE Entresto  49-51mg  (1 tab) two times daily  Lab Work:  Go over to the [facility]. Go pass the gift shop and have your blood work completed.  We will only call you if the results are abnormal or if the provider would like to make medication changes.  No news is good news.    Follow-Up in: Please follow up with the Advanced [facility] Clinic in 3 months with Ellouise Class, FNP.   Thank you for choosing [facility].    At the [facility], you and your health needs are our priority. We have a designated team specialized in the treatment of Heart Failure. This Care Team includes your primary Heart Failure Specialized Cardiologist (physician), Advanced Practice Providers (APPs- Physician Assistants and Nurse Practitioners), and Pharmacist who all work together to provide you with the care you need, when you need it.   You may see any of the following providers on your designated Care Team at your next follow up:  Dr. Toribio Fuel Dr. Ezra Shuck Dr. Ria Commander Dr. Morene Brownie Ellouise Class, FNP Jaun Bash, RPH-CPP  Please be sure to bring in all your medications bottles to every appointment.   Need to Contact Us :  If you have any questions or concerns before your next appointment please send us  a message through [place] or call our office at [phone].    TO LEAVE A MESSAGE FOR THE NURSE SELECT OPTION 2, PLEASE LEAVE A MESSAGE INCLUDING: YOUR NAME DATE OF BIRTH CALL BACK NUMBER REASON FOR CALL**this is important as we prioritize the call backs  YOU WILL RECEIVE A CALL BACK THE SAME DAY AS LONG AS YOU CALL BEFORE 4:00 PM

## 2023-10-13 NOTE — Telephone Encounter (Signed)
 Can someone call the patient and see what dose of Entresto  he has been on so that it can be refilled? I am having a hard time figuring out dose from notes.

## 2023-10-13 NOTE — Progress Notes (Signed)
 Medication Samples have been provided to the patient.  Drug name: entresto        Strength: 49-51mg         Qty: 2 bottles  LOT: [id number]  Exp.Date: oct 2026  Dosing instructions: take 1 tab two times daily  The patient has been instructed regarding the correct time, dose, and frequency of taking this medication, including desired effects and most common side effects.   [place] A Kekoa Fyock 2:50 PM 10/13/2023

## 2023-10-14 ENCOUNTER — Ambulatory Visit: Payer: Self-pay | Admitting: Cardiology

## 2023-10-14 ENCOUNTER — Ambulatory Visit

## 2023-10-14 ENCOUNTER — Ambulatory Visit (HOSPITAL_COMMUNITY): Payer: Self-pay | Admitting: Cardiology

## 2023-10-14 DIAGNOSIS — I255 Ischemic cardiomyopathy: Secondary | ICD-10-CM

## 2023-10-14 DIAGNOSIS — I5022 Chronic systolic (congestive) heart failure: Secondary | ICD-10-CM

## 2023-10-14 NOTE — Progress Notes (Signed)
 PCP: Edman Marsa PARAS, DO Cardiology: Dr. Perla HF Cardiology: Dr. Rolan  Chief complaint: CHF  73 y.o. with history of CAD, ischemic cardiomyopathy, diabetes, and HTN was referred by Dr. Gollan for evaluation of CHF. Patient was admitted in 10/24 with anterior MI. Cath showed occluded proximal LAD treated with DES, D1 and D2 jailed by stent, residual 60% distal LAD.  Echo in 10/24 showed EF 35-40%, normal RV.  Repeat echo in 3/25 showed EF 30-35%, anteroseptal akinesis, RV normal, mild MR.    [employer] ICD placed in 5/25.   Patient returns for followup of CHF and CAD.  Spanish interpreter was used.  Weight 2 lbs.  Patient had hematuria and stopped ASA, this has resolved.  He remains on ticagrelor .  He has generalized fatigue.  No dyspnea walking on flat ground, ok with flight of stairs. No exertional chest pain.  No lightheadedness.   Labs (2/25): LDL 69, K 4.5, creatinine 9.12, hgb 15.6  Labs (4/25): K 4.7 => 5.1, creatinine 0.97 => 1.08, BNP 64, LDL 45  PMH: 1. Type 2 diabetes 2. HTN 3. Hyperlipidemia 4. CAD: Anterior MI in 10/24, cath with occluded proximal LAD treated with DES, D1 and D2 jailed by stent, residual 60% distal LAD.  5. Chronic systolic CHF: Ischemic cardiomyopathy. [employer] ICD.  - Echo (10/24): EF 35-40%, normal RV - Echo (3/25): EF 30-35%, anteroseptal akinesis, RV normal, mild MR.  6. Zio monitor (5/25): 1 run of 6 beats SVT.   SH: Retired, prior smoker but has quit. Prior heavy ETOH. Lives in [place] with wife. Spanish-speaking.   Family History  Problem Relation Age of Onset   Heart disease Mother    Arthritis Mother    Hypertension Brother    Diabetes Daughter    ROS: All systems reviewed and negative except as per HPI.   Current Outpatient Medications  Medication Sig Dispense Refill   ACCU-CHEK AVIVA PLUS test strip Use to check blood sugar up to 1 x per day 100 each 12   Accu-Chek Softclix Lancets lancets Use to check blood  sugar up to 1 x per day 100 each 12   atorvastatin  (LIPITOR ) 80 MG tablet Take 1 tablet (80 mg total) by mouth at bedtime. 90 tablet 3   Blood Glucose Monitoring Suppl (ACCU-CHEK AVIVA PLUS) w/Device KIT Use to check blood sugar up to 1 x per day 1 kit 0   carvedilol  (COREG ) 6.25 MG tablet TAKE 1 TABLET(6.25 MG) BY MOUTH TWICE DAILY WITH A MEAL 60 tablet 3   dorzolamide -timolol  (COSOPT ) 22.3-6.8 MG/ML ophthalmic solution Place 1 drop into both eyes 2 (two) times daily.     empagliflozin  (JARDIANCE ) 10 MG TABS tablet Take 1 tablet (10 mg total) by mouth daily. 30 tablet 2   ezetimibe  (ZETIA ) 10 MG tablet Take 1 tablet (10 mg total) by mouth daily. 90 tablet 2   finasteride  (PROSCAR ) 5 MG tablet Take 1 tablet (5 mg total) by mouth daily. 90 tablet 3   folic acid  (FOLVITE ) 1 MG tablet Take 1 tablet (1 mg total) by mouth daily. 30 tablet 2   gabapentin  (NEURONTIN ) 600 MG tablet Take 1 tablet (600 mg total) by mouth 3 (three) times daily. 270 tablet 3   isosorbide  mononitrate (IMDUR ) 30 MG 24 hr tablet TAKE 1/2 TABLET(15 MG) BY MOUTH DAILY 45 tablet 3   JARDIANCE  25 MG TABS tablet Take 25 mg by mouth daily.     latanoprost  (XALATAN ) 0.005 % ophthalmic solution Place 1 drop into  both eyes at bedtime.  6   levothyroxine  (SYNTHROID ) 75 MCG tablet Take 75 mcg by mouth daily before breakfast.     Multiple Vitamin (MULTIVITAMIN WITH MINERALS) TABS tablet Take 1 tablet by mouth daily. 30 tablet 2   nitroGLYCERIN  (NITROSTAT ) 0.4 MG SL tablet Place 1 tablet (0.4 mg total) under the tongue every 5 (five) minutes as needed for chest pain. 25 tablet 2   omeprazole  (PRILOSEC) 40 MG capsule Take 1 capsule (40 mg total) by mouth daily before breakfast. 90 capsule 1   spironolactone  (ALDACTONE ) 25 MG tablet Take 1 tablet (25 mg total) by mouth daily. 90 tablet 3   ticagrelor  (BRILINTA ) 90 MG TABS tablet TAKE 1 TABLET(90 MG) BY MOUTH TWICE DAILY 60 tablet 11   sacubitril -valsartan  (ENTRESTO ) 49-51 MG Take 1 tablet by  mouth 2 (two) times daily. 60 tablet 5   No current facility-administered medications for this visit.   BP 101/70   Pulse 63   Wt 164 lb 9.6 oz (74.7 kg)   SpO2 97%   BMI 31.10 kg/m  General: NAD Neck: No JVD, no thyromegaly or thyroid  nodule.  Lungs: Clear to auscultation bilaterally with normal respiratory effort. CV: Nondisplaced PMI.  Heart regular S1/S2, no S3/S4, no murmur.  No peripheral edema.  No carotid bruit.  Normal pedal pulses.  Abdomen: Soft, nontender, no hepatosplenomegaly, no distention.  Skin: Intact without lesions or rashes.  Neurologic: Alert and oriented x 3.  Psych: Normal affect. Extremities: No clubbing or cyanosis.  HEENT: Normal.   Assessment/Plan: 1. Chronic systolic CHF: Ischemic cardiomyopathy. [employer] ICD.  Most recent echo in 3/25 showed EF 30-35%, anteroseptal akinesis, RV normal, mild MR.  NYHA class II, mainly due to fatigue.  He is not volume overloaded on exam. No BP room to increase meds.  - Continue Coreg  6.25 mg bid. - Continue Jardiance  10 mg daily.  - Continue Entresto  49/51 bid, needs refill.  - Continue spironolactone  25 mg daily, BMET/BNP today.  - Refer to cardiac rehab at [facility]  2. CAD: Anterior MI in 10/24, cath with occluded proximal LAD treated with DES, D1 and D2 jailed by stent, residual 60% distal LAD. Rare atypical chest pain.  - Continue ticagrelor  90 bid.  Off ASA now due to hematuria, now resolved.  I think he can stay off ASA and just on ticagrelor .  Transition to single agent clopidogrel at 1 year post-MI.  - Continue atorvastatin  80 mg daily. Good lipids in 4/25.  3. Hyperlipidemia: Goal LDL < 55.  - Continue atorvastatin  80 mg daily.  - Continue Zetia .   Followup in 3 months with APP.     I spent 31 minutes reviewing records, interviewing/examining patient, and managing orders.   Ezra Shuck 10/14/2023

## 2023-10-15 LAB — CUP PACEART REMOTE DEVICE CHECK
Battery Remaining Longevity: 144 mo
Battery Remaining Percentage: 100 %
Brady Statistic RA Percent Paced: 66 %
Brady Statistic RV Percent Paced: 0 %
Date Time Interrogation Session: 20250812142100
HighPow Impedance: 74 Ohm
Implantable Lead Connection Status: 753985
Implantable Lead Connection Status: 753985
Implantable Lead Implant Date: 20250505
Implantable Lead Implant Date: 20250505
Implantable Lead Location: 753859
Implantable Lead Location: 753860
Implantable Lead Model: 673
Implantable Lead Model: 7841
Implantable Lead Serial Number: 560644
Implantable Lead Serial Number: 7463987
Implantable Pulse Generator Implant Date: 20250505
Lead Channel Impedance Value: 647 Ohm
Lead Channel Impedance Value: 875 Ohm
Lead Channel Pacing Threshold Amplitude: 0.4 V
Lead Channel Pacing Threshold Amplitude: 0.8 V
Lead Channel Pacing Threshold Pulse Width: 0.4 ms
Lead Channel Pacing Threshold Pulse Width: 0.4 ms
Lead Channel Setting Pacing Amplitude: 3.5 V
Lead Channel Setting Pacing Amplitude: 3.5 V
Lead Channel Setting Pacing Pulse Width: 0.4 ms
Lead Channel Setting Sensing Sensitivity: 0.6 mV
Pulse Gen Serial Number: 695502
Zone Setting Status: 755011

## 2023-10-18 ENCOUNTER — Ambulatory Visit: Payer: Self-pay | Admitting: Cardiology

## 2023-10-23 MED ORDER — SPIRONOLACTONE 25 MG PO TABS: 12.5000 mg | ORAL_TABLET | Freq: Every day | ORAL | 3 refills | Status: DC

## 2023-10-23 NOTE — Telephone Encounter (Signed)
 Contacted pt via interpreter to make aware of labs and med changes. Pt agreeable to get labs in 1 week. Orders changed per Dr. Rolan. No further questions at this time.

## 2023-10-26 ENCOUNTER — Ambulatory Visit (INDEPENDENT_AMBULATORY_CARE_PROVIDER_SITE_OTHER): Payer: 59 | Admitting: Family Medicine

## 2023-10-26 ENCOUNTER — Encounter: Payer: Self-pay | Admitting: Family Medicine

## 2023-10-26 VITALS — BP 92/56 | HR 60 | Wt 163.0 lb

## 2023-10-26 DIAGNOSIS — Z7984 Long term (current) use of oral hypoglycemic drugs: Secondary | ICD-10-CM | POA: Diagnosis not present

## 2023-10-26 DIAGNOSIS — E1169 Type 2 diabetes mellitus with other specified complication: Secondary | ICD-10-CM | POA: Diagnosis not present

## 2023-10-26 DIAGNOSIS — Z23 Encounter for immunization: Secondary | ICD-10-CM | POA: Diagnosis not present

## 2023-10-26 DIAGNOSIS — E785 Hyperlipidemia, unspecified: Secondary | ICD-10-CM

## 2023-10-26 DIAGNOSIS — I25118 Atherosclerotic heart disease of native coronary artery with other forms of angina pectoris: Secondary | ICD-10-CM | POA: Diagnosis not present

## 2023-10-26 DIAGNOSIS — E1136 Type 2 diabetes mellitus with diabetic cataract: Secondary | ICD-10-CM

## 2023-10-26 NOTE — Patient Instructions (Addendum)
 Thank you for coming to the office today.  Mild low BP today  If you develop lightheadedness dizzy when standing, may need to contact your Cardiologist to reduce BP medications.  Prevnar-20 vaccine today.  Dr Ezra Shuck  [facility] at [facility] [address], [address] [place], [place] [address] [phone]   DUE for FASTING BLOOD WORK (no food or drink after midnight before the lab appointment, only water or coffee without cream/sugar on the morning of)  - Make sure Lab Only appointment is at about 1 week before your next appointment, so that results will be available  For Lab Results, once available within 2-3 days of blood draw, you can can log in to MyChart online to view your results and a brief explanation. Also, we can discuss results at next follow-up visit.   Please schedule a Follow-up Appointment to: Return for 6 month fasting lab > 1 week later Annual Physical.  If you have any other questions or concerns, please feel free to call the office or send a message through MyChart. You may also schedule an earlier appointment if necessary.  Additionally, you may be receiving a survey about your experience at our office within a few days to 1 week by e-mail or mail. We value your feedback.  Marsa Officer, DO [facility], [place]

## 2023-10-26 NOTE — Progress Notes (Signed)
 Subjective:    Patient ID: Ross Riley, male    DOB: 1950-08-05, 73 y.o.   MRN: [id number]  Ross Riley is a 73 y.o. male presenting on 10/26/2023 for Diabetes (A1C)  Spanish language interpreter Aloysius [phone]  HPI  Discussed the use of AI scribe software for clinical note transcription with the patient, who gave verbal consent to proceed.  History of Present Illness   Ross Riley is a 73 year old male who presents for a routine six-month follow-up visit. He is accompanied by Ross Riley.  Hypotension / Systolic CHF Followed by [facility] - Blood pressure measured at 92/54 mmHg during today's visit - Historical blood pressure typically in the low 100s over 60s - No symptoms of lightheadedness or dizziness - Currently taking carvedilol , spironolactone , and Entresto  for cardiac condition - Monitors blood pressure at home    Diabetes, Type 2 Followed by [facility] A1c 8.1 recently Meds: Jardiance  10mg  Riley Reports good compliance. Tolerating well w/o side-effects Currently on ACEi, ASA, Statin Lifestyle: - Diet (balanced diet) - Exercise (limited activity due to chronic LLE issues see note)   Health Maintenance: Prevnar-20 vaccine due today, previously 2020 was last dose.     08/13/2023    2:55 PM 12/26/2022    2:03 PM 10/24/2022    2:25 PM  Depression screen PHQ 2/9  Decreased Interest 0 0 0  Down, Depressed, Hopeless 0 0 1  PHQ - 2 Score 0 0 1  Altered sleeping  0 0  Tired, decreased energy  0 1  Change in appetite  0 0  Feeling bad or failure about yourself   0 0  Trouble concentrating  0 0  Moving slowly or fidgety/restless  0 0  Suicidal thoughts  0 3  PHQ-9 Score  0 5  Difficult doing work/chores  Not difficult at all Somewhat difficult       12/26/2022    2:03 PM 10/24/2022    2:26 PM 06/23/2022    4:34 PM 04/25/2022   10:24 AM  GAD 7 : Generalized Anxiety Score  Nervous, Anxious, on Edge 0 0 0 1  Control/stop worrying 0 0 0  1  Worry too much - different things 0 0 0 0  Trouble relaxing 0 1 0 1  Restless 0 1 0 0  Easily annoyed or irritable 0 0 0 0  Afraid - awful might happen 0 0 0 0  Total GAD 7 Score 0 2 0 3  Anxiety Difficulty Not difficult at all Not difficult at all  Not difficult at all    Social History   Tobacco Use   Smoking status: Former    Current packs/day: 0.00    Average packs/day: 1 pack/day for 32.0 years (32.0 ttl pk-yrs)    Types: Cigarettes    Start date: 03/04/1959    Quit date: 03/04/1991    Years since quitting: 32.6   Smokeless tobacco: Former  Building services engineer status: Never Used  Substance Use Topics   Alcohol use: Yes    Alcohol/week: 7.0 standard drinks of alcohol    Types: 7 Shots of liquor per week    Comment: 1-2 shots of vodka Riley   Drug use: No    Review of Systems Per HPI unless specifically indicated above     Objective:    BP (!) 92/56 (BP Location: Left Arm, Cuff Size: Normal)   Pulse 60   Wt 163 lb (73.9 kg)  SpO2 95%   BMI 30.80 kg/m   Wt Readings from Last 3 Encounters:  10/26/23 163 lb (73.9 kg)  10/13/23 164 lb 9.6 oz (74.7 kg)  10/06/23 165 lb 9.6 oz (75.1 kg)    Physical Exam Vitals and nursing note reviewed.  Constitutional:      General: He is not in acute distress.    Appearance: Normal appearance. He is well-developed. He is not diaphoretic.     Comments: Well-appearing, comfortable, cooperative  HENT:     Head: Normocephalic and atraumatic.  Eyes:     General:        Right eye: No discharge.        Left eye: No discharge.     Conjunctiva/sclera: Conjunctivae normal.  Cardiovascular:     Rate and Rhythm: Normal rate.  Pulmonary:     Effort: Pulmonary effort is normal.  Skin:    General: Skin is warm and dry.     Findings: No erythema or rash.  Neurological:     Mental Status: He is alert and oriented to person, place, and time.  Psychiatric:        Mood and Affect: Mood normal.        Behavior: Behavior normal.         Thought Content: Thought content normal.     Comments: Well groomed, good eye contact, normal speech and thoughts     Results for orders placed or performed in visit on 10/26/23  Hemoglobin A1c   Collection Time: 09/15/23 12:00 AM  Result Value Ref Range   Hemoglobin A1C 8.1       Assessment & Plan:   Problem List Items Addressed This Visit     Hyperlipidemia associated with type 2 diabetes mellitus (HCC)   Type 2 diabetes mellitus with diabetic cataract, without long-term current use of insulin  (HCC) - Primary   Other Visit Diagnoses       Need for Streptococcus pneumoniae vaccination       Relevant Orders   Pneumococcal conjugate vaccine 20-valent (Completed)     Coronary artery disease of native artery of native heart with stable angina pectoris (HCC)         Long term current use of oral hypoglycemic drug           Type 2 Diabetes Managed by Endocrinology Last  A1c 8.1 On Jardiance  SGLT2  Hypertension Blood pressure at 92/54 mmHg, lower than usual. Current medications may contribute to hypotension. He is asymptomatic Followed by [facility] team and most medications are related to this reason, we discussed if he develops symptoms of low BP, can contact us  or cardiology and follow-up adjust meds - Monitor blood pressure at home. - Contact cardiologist if blood pressure remains low or symptoms develop.  Encounter for pneumococcal vaccination Due for updated pneumococcal vaccination, last in 2020. - Administer pneumococcal vaccine today.        Orders Placed This Encounter  Procedures   Pneumococcal conjugate vaccine 20-valent   Hemoglobin A1c    This external order was created through the Results Console.    No orders of the defined types were placed in this encounter.   Follow up plan: Return for 6 month Annual Phys labs after.   Marsa Officer, DO [facility] 10/26/2023, 2:10 PM

## 2023-10-29 ENCOUNTER — Other Ambulatory Visit: Payer: Self-pay

## 2023-10-29 MED ORDER — SACUBITRIL-VALSARTAN 24-26 MG PO TABS: 1.0000 | ORAL_TABLET | Freq: Two times a day (BID) | ORAL | 3 refills | Status: DC

## 2023-10-29 NOTE — Telephone Encounter (Signed)
 Pt called via daughter. Pt states that he has been having low blood pressures and headaches. Bp's running 90/60s. Pt concerned that it might be his jardiance . Verified dose. Pt states he started jardiance  25mg  by his endocrinologist. Per Ellouise Class, FNP, decrease entresto  to 24/26mg  dose and to call us  back it hypotension persists. Pt verbalized understanding. New orders placed.

## 2023-10-30 ENCOUNTER — Ambulatory Visit (HOSPITAL_COMMUNITY): Payer: Self-pay | Admitting: Cardiology

## 2023-10-30 ENCOUNTER — Other Ambulatory Visit
Admission: RE | Admit: 2023-10-30 | Discharge: 2023-10-30 | Disposition: A | Discharge status: Home / Self Care | Attending: Cardiology | Admitting: Cardiology

## 2023-10-30 DIAGNOSIS — I5022 Chronic systolic (congestive) heart failure: Secondary | ICD-10-CM | POA: Diagnosis not present

## 2023-10-30 LAB — BASIC METABOLIC PANEL WITH GFR
Anion gap: 11 (ref 5–15)
BUN: 16 mg/dL (ref 8–23)
CO2: 24 mmol/L (ref 22–32)
Calcium: 9.2 mg/dL (ref 8.9–10.3)
Chloride: 107 mmol/L (ref 98–111)
Creatinine, Ser: 1.09 mg/dL (ref 0.61–1.24)
GFR, Estimated: >60 mL/min (ref >60)
Glucose, Bld: 145 mg/dL — ABNORMAL HIGH (ref 70–99)
Potassium: 4.7 mmol/L (ref 3.5–5.1)
Sodium: 142 mmol/L (ref 135–145)

## 2023-11-12 ENCOUNTER — Encounter

## 2023-11-12 ENCOUNTER — Encounter: Payer: Self-pay | Admitting: *Deleted

## 2023-11-12 ENCOUNTER — Encounter: Attending: Cardiology | Admitting: *Deleted

## 2023-11-12 VITALS — Ht 61.0 in | Wt 163.0 lb

## 2023-11-12 DIAGNOSIS — I5022 Chronic systolic (congestive) heart failure: Secondary | ICD-10-CM

## 2023-11-12 NOTE — Patient Instructions (Signed)
 Patient Instructions  Patient Details  Name: Ross Riley MRN: [id number] Date of Birth: 10-Jul-1950 Referring Provider:  Rolan Ezra RAMAN, MD  Below are your personal goals for exercise, nutrition, and risk factors. Our goal is to help you stay on track towards obtaining and maintaining these goals. We will be discussing your progress on these goals with you throughout the program.  Initial Exercise Prescription:  Initial Exercise Prescription - 11/12/23 1500       Date of Initial Exercise RX and Referring Provider   Date 11/12/23    Referring Provider Dr. Ezra Rolan      Oxygen   Maintain Oxygen Saturation 88% or higher      Recumbant Bike   Level 1    RPM 50    Watts 25    Minutes 15    METs 2      NuStep   Level 1    SPM 80    Minutes 15    METs 2      T5 Nustep   Level 1    SPM 80    Minutes 15    METs 2      Biostep-RELP   Level 1    SPM 50    Minutes 15    METs 2      Track   Laps 10    Minutes 15    METs 1.54      Prescription Details   Duration Progress to 30 minutes of continuous aerobic without signs/symptoms of physical distress      Intensity   THRR 40-80% of Max Heartrate 97-131    Ratings of Perceived Exertion 11-13    Perceived Dyspnea 0-4      Progression   Progression Continue to progress workloads to maintain intensity without signs/symptoms of physical distress.      Resistance Training   Training Prescription Yes    Weight 4lb    Reps 10-15          Exercise Goals: Frequency: Be able to perform aerobic exercise two to three times per week in program working toward 2-5 days per week of home exercise.  Intensity: Work with a perceived exertion of 11 (fairly light) - 15 (hard) while following your exercise prescription.  We will make changes to your prescription with you as you progress through the program.   Duration: Be able to do 30 to 45 minutes of continuous aerobic exercise in addition to a 5 minute warm-up and a 5  minute cool-down routine.   Nutrition Goals: Your personal nutrition goals will be established when you do your nutrition analysis with the dietician.  The following are general nutrition guidelines to follow: Cholesterol < 200mg /day Sodium < 1500mg /day Fiber: Men over 50 yrs - 30 grams per day  Personal Goals:  Personal Goals and Risk Factors at Admission - 11/12/23 1356       Core Components/Risk Factors/Patient Goals on Admission    Weight Management Weight Maintenance;Yes    Intervention Weight Management: Develop a combined nutrition and exercise program designed to reach desired caloric intake, while maintaining appropriate intake of nutrient and fiber, sodium and fats, and appropriate energy expenditure required for the weight goal.;Weight Management: Provide education and appropriate resources to help participant work on and attain dietary goals.    Expected Outcomes Short Term: Continue to assess and modify interventions until short term weight is achieved;Weight Maintenance: Understanding of the daily nutrition guidelines, which includes 25-35% calories from fat, 7% or  less cal from saturated fats, less than 200mg  cholesterol, less than 1.5gm of sodium, & 5 or more servings of fruits and vegetables daily;Long Term: Adherence to nutrition and physical activity/exercise program aimed toward attainment of established weight goal;Understanding recommendations for meals to include 15-35% energy as protein, 25-35% energy from fat, 35-60% energy from carbohydrates, less than 200mg  of dietary cholesterol, 20-35 gm of total fiber daily;Understanding of distribution of calorie intake throughout the day with the consumption of 4-5 meals/snacks    Diabetes Yes    Intervention Provide education about signs/symptoms and action to take for hypo/hyperglycemia.;Provide education about proper nutrition, including hydration, and aerobic/resistive exercise prescription along with prescribed medications to  achieve blood glucose in normal ranges: Fasting glucose 65-99 mg/dL    Expected Outcomes Short Term: Participant verbalizes understanding of the signs/symptoms and immediate care of hyper/hypoglycemia, proper foot care and importance of medication, aerobic/resistive exercise and nutrition plan for blood glucose control.;Long Term: Attainment of HbA1C < 7%.    Heart Failure Yes    Intervention Provide a combined exercise and nutrition program that is supplemented with education, support and counseling about heart failure. Directed toward relieving symptoms such as shortness of breath, decreased exercise tolerance, and extremity edema.    Expected Outcomes Improve functional capacity of life;Short term: Attendance in program 2-3 days a week with increased exercise capacity. Reported lower sodium intake. Reported increased fruit and vegetable intake. Reports medication compliance.;Short term: Daily weights obtained and reported for increase. Utilizing diuretic protocols set by physician.;Long term: Adoption of self-care skills and reduction of barriers for early signs and symptoms recognition and intervention leading to self-care maintenance.    Lipids Yes    Intervention Provide education and support for participant on nutrition & aerobic/resistive exercise along with prescribed medications to achieve LDL 70mg , HDL >40mg .    Expected Outcomes Short Term: Participant states understanding of desired cholesterol values and is compliant with medications prescribed. Participant is following exercise prescription and nutrition guidelines.;Long Term: Cholesterol controlled with medications as prescribed, with individualized exercise RX and with personalized nutrition plan. Value goals: LDL < 70mg , HDL > 40 mg.          Exercise Goals and Review:  Exercise Goals     Row Name 11/12/23 1516             Exercise Goals   Increase Physical Activity Yes       Intervention Provide advice, education, support  and counseling about physical activity/exercise needs.;Develop an individualized exercise prescription for aerobic and resistive training based on initial evaluation findings, risk stratification, comorbidities and participant's personal goals.       Expected Outcomes Short Term: Attend rehab on a regular basis to increase amount of physical activity.;Long Term: Add in home exercise to make exercise part of routine and to increase amount of physical activity.;Long Term: Exercising regularly at least 3-5 days a week.       Increase Strength and Stamina Yes       Intervention Provide advice, education, support and counseling about physical activity/exercise needs.;Develop an individualized exercise prescription for aerobic and resistive training based on initial evaluation findings, risk stratification, comorbidities and participant's personal goals.       Expected Outcomes Short Term: Increase workloads from initial exercise prescription for resistance, speed, and METs.;Short Term: Perform resistance training exercises routinely during rehab and add in resistance training at home;Long Term: Improve cardiorespiratory fitness, muscular endurance and strength as measured by increased METs and functional capacity ( )  Able to understand and use rate of perceived exertion (RPE) scale Yes       Intervention Provide education and explanation on how to use RPE scale       Expected Outcomes Short Term: Able to use RPE daily in rehab to express subjective intensity level;Long Term:  Able to use RPE to guide intensity level when exercising independently       Able to understand and use Dyspnea scale Yes       Intervention Provide education and explanation on how to use Dyspnea scale       Expected Outcomes Short Term: Able to use Dyspnea scale daily in rehab to express subjective sense of shortness of breath during exertion;Long Term: Able to use Dyspnea scale to guide intensity level when exercising  independently       Knowledge and understanding of Target Heart Rate Range (THRR) Yes       Intervention Provide education and explanation of THRR including how the numbers were predicted and where they are located for reference       Expected Outcomes Short Term: Able to state/look up THRR;Long Term: Able to use THRR to govern intensity when exercising independently       Able to check pulse independently Yes       Intervention Provide education and demonstration on how to check pulse in carotid and radial arteries.;Review the importance of being able to check your own pulse for safety during independent exercise       Expected Outcomes Short Term: Able to explain why pulse checking is important during independent exercise;Long Term: Able to check pulse independently and accurately       Understanding of Exercise Prescription Yes       Intervention Provide education, explanation, and written materials on patient's individual exercise prescription       Expected Outcomes Short Term: Able to explain program exercise prescription;Long Term: Able to explain home exercise prescription to exercise independently          Copy of goals given to participant.

## 2023-11-12 NOTE — Progress Notes (Signed)
 Initial orientation completed. Diagnosis can be found in [facility] 8/12. EP orientation today at 2pm.

## 2023-11-12 NOTE — Progress Notes (Signed)
 Cardiac Individual Treatment Plan  Patient Details  Name: Ross Riley MRN: [id number] Date of Birth: 09-20-1950 Referring Provider:   Flowsheet Row Cardiac Rehab from 11/12/2023 in [facility] Cardiac and Pulmonary Rehab  Referring Provider Dr. Ezra Shuck    Initial Encounter Date:  Flowsheet Row Cardiac Rehab from 11/12/2023 in [facility] [address] Cardiac and Pulmonary Rehab  Date 11/12/23    Visit Diagnosis: Heart failure, chronic systolic (HCC)  Patient's Home Medications on Admission:  Current Outpatient Medications:    ACCU-CHEK AVIVA PLUS test strip, Use to check blood sugar up to 1 x per day, Disp: 100 each, Rfl: 12   Accu-Chek Softclix Lancets lancets, Use to check blood sugar up to 1 x per day, Disp: 100 each, Rfl: 12   atorvastatin  (LIPITOR ) 80 MG tablet, Take 1 tablet (80 mg total) by mouth at bedtime., Disp: 90 tablet, Rfl: 3   Blood Glucose Monitoring Suppl (ACCU-CHEK AVIVA PLUS) w/Device KIT, Use to check blood sugar up to 1 x per day, Disp: 1 kit, Rfl: 0   carvedilol  (COREG ) 6.25 MG tablet, TAKE 1 TABLET(6.25 MG) BY MOUTH TWICE DAILY WITH A MEAL, Disp: 60 tablet, Rfl: 3   dorzolamide -timolol  (COSOPT ) 22.3-6.8 MG/ML ophthalmic solution, Place 1 drop into both eyes 2 (two) times daily., Disp: , Rfl:    ezetimibe  (ZETIA ) 10 MG tablet, Take 1 tablet (10 mg total) by mouth daily., Disp: 90 tablet, Rfl: 2   finasteride  (PROSCAR ) 5 MG tablet, Take 1 tablet (5 mg total) by mouth daily., Disp: 90 tablet, Rfl: 3   folic acid  (FOLVITE ) 1 MG tablet, Take 1 tablet (1 mg total) by mouth daily., Disp: 30 tablet, Rfl: 2   gabapentin  (NEURONTIN ) 600 MG tablet, Take 1 tablet (600 mg total) by mouth 3 (three) times daily., Disp: 270 tablet, Rfl: 3   isosorbide  mononitrate (IMDUR ) 30 MG 24 hr tablet, TAKE 1/2 TABLET(15 MG) BY MOUTH DAILY, Disp: 45 tablet, Rfl: 3   JARDIANCE  25 MG TABS tablet, Take 25 mg by mouth daily., Disp: , Rfl:    latanoprost  (XALATAN ) 0.005 % ophthalmic solution, Place 1 drop into  both eyes at bedtime., Disp: , Rfl: 6   levothyroxine  (SYNTHROID ) 75 MCG tablet, Take 75 mcg by mouth daily before breakfast., Disp: , Rfl:    Multiple Vitamin (MULTIVITAMIN WITH MINERALS) TABS tablet, Take 1 tablet by mouth daily., Disp: 30 tablet, Rfl: 2   nitroGLYCERIN  (NITROSTAT ) 0.4 MG SL tablet, Place 1 tablet (0.4 mg total) under the tongue every 5 (five) minutes as needed for chest pain., Disp: 25 tablet, Rfl: 2   omeprazole  (PRILOSEC) 40 MG capsule, Take 1 capsule (40 mg total) by mouth daily before breakfast., Disp: 90 capsule, Rfl: 1   sacubitril -valsartan  (ENTRESTO ) 24-26 MG, Take 1 tablet by mouth 2 (two) times daily., Disp: 180 tablet, Rfl: 3   spironolactone  (ALDACTONE ) 25 MG tablet, Take 0.5 tablets (12.5 mg total) by mouth daily., Disp: 90 tablet, Rfl: 3   ticagrelor  (BRILINTA ) 90 MG TABS tablet, TAKE 1 TABLET(90 MG) BY MOUTH TWICE DAILY, Disp: 60 tablet, Rfl: 11  Past Medical History: Past Medical History:  Diagnosis Date   Arthritis    BPH (benign prostatic hyperplasia)    Chronic kidney disease    kidney stones   Dystonia    ED (erectile dysfunction)    GERD (gastroesophageal reflux disease)    Glaucoma (increased eye pressure)    Hematuria    Hemorrhoids    Hyperlipidemia    Hypertension    Stroke (HCC)  Thyroid  disease     Tobacco Use: Social History   Tobacco Use  Smoking Status Former   Current packs/day: 0.00   Average packs/day: 1 pack/day for 32.0 years (32.0 ttl pk-yrs)   Types: Cigarettes   Start date: 03/04/1959   Quit date: 03/04/1991   Years since quitting: 32.7  Smokeless Tobacco Former    Labs: Review Flowsheet  More data exists      Latest Ref Rng & Units 10/02/2022 12/20/2022 04/27/2023 07/01/2023 09/15/2023  Labs for ITP Cardiac and Pulmonary Rehab  Cholestrol 100 - 199 mg/dL - 827  860  882  -  LDL (calc) 0 - 99 mg/dL - 90  69  45  -  HDL-C >39 mg/dL - 49  45  46  -  Trlycerides 0 - 149 mg/dL - 833  823  842  -  Hemoglobin A1c - 7.3      7.0  7.6  - 8.1        Details       This result is from an external source.          Exercise Target Goals: Exercise Program Goal: Individual exercise prescription set using results from initial 6 min walk test and THRR while considering  patient's activity barriers and safety.   Exercise Prescription Goal: Initial exercise prescription builds to 30-45 minutes a day of aerobic activity, 2-3 days per week.  Home exercise guidelines will be given to patient during program as part of exercise prescription that the participant will acknowledge.   Education: Aerobic Exercise: - Group verbal and visual presentation on the components of exercise prescription. Introduces F.I.T.T principle from ACSM for exercise prescriptions.  Reviews F.I.T.T. principles of aerobic exercise including progression. Written material provided at class time.   Education: Resistance Exercise: - Group verbal and visual presentation on the components of exercise prescription. Introduces F.I.T.T principle from ACSM for exercise prescriptions  Reviews F.I.T.T. principles of resistance exercise including progression. Written material provided at class time.    Education: Exercise & Equipment Safety: - Individual verbal instruction and demonstration of equipment use and safety with use of the equipment. Flowsheet Row Cardiac Rehab from 11/12/2023 in [facility] Cardiac and Pulmonary Rehab  Date 11/12/23  Educator MJC  Instruction Review Code 1- Verbalizes Understanding    Education: Exercise Physiology & General Exercise Guidelines: - Group verbal and written instruction with models to review the exercise physiology of the cardiovascular system and associated critical values. Provides general exercise guidelines with specific guidelines to those with heart or lung disease. Written material provided at class time.   Education: Flexibility, Balance, Mind/Body Relaxation: - Group verbal and visual presentation with  interactive activity on the components of exercise prescription. Introduces F.I.T.T principle from ACSM for exercise prescriptions. Reviews F.I.T.T. principles of flexibility and balance exercise training including progression. Also discusses the mind body connection.  Reviews various relaxation techniques to help reduce and manage stress (i.e. Deep breathing, progressive muscle relaxation, and visualization). Balance handout provided to take home. Written material provided at class time.   Activity Barriers & Risk Stratification:  Activity Barriers & Cardiac Risk Stratification - 11/12/23 1512       Activity Barriers & Cardiac Risk Stratification   Activity Barriers Joint Problems;Deconditioning;Shortness of Breath;Other (comment)    Comments Left ankle pains/ mobility issues, shortness of breath.    Cardiac Risk Stratification High          6 Minute Walk:  6 Minute Walk     Row  Name 11/12/23 1510         6 Minute Walk   Phase Initial     Distance 380 feet     Walk Time 5.6 minutes     # of Rest Breaks 2     MPH 0.7     METS 1     RPE 13     Perceived Dyspnea  3     VO2 Peak 1.9     Symptoms Yes (comment)     Comments Dyspnea, left ankle pain, brief spell of dizziness     Resting HR 64 bpm     Resting BP 96/54     Resting Oxygen Saturation  95 %     Exercise Oxygen Saturation  during 6 min walk 97 %     Max Ex. HR 79 bpm     Max Ex. BP 104/54     2 Minute Post BP 92/52        Oxygen Initial Assessment:   Oxygen Re-Evaluation:   Oxygen Discharge (Final Oxygen Re-Evaluation):   Initial Exercise Prescription:  Initial Exercise Prescription - 11/12/23 1500       Date of Initial Exercise RX and Referring Provider   Date 11/12/23    Referring Provider Dr. Ezra Shuck      Oxygen   Maintain Oxygen Saturation 88% or higher      Recumbant Bike   Level 1    RPM 50    Watts 25    Minutes 15    METs 2      NuStep   Level 1    SPM 80    Minutes 15     METs 2      T5 Nustep   Level 1    SPM 80    Minutes 15    METs 2      Biostep-RELP   Level 1    SPM 50    Minutes 15    METs 2      Track   Laps 10    Minutes 15    METs 1.54      Prescription Details   Duration Progress to 30 minutes of continuous aerobic without signs/symptoms of physical distress      Intensity   THRR 40-80% of Max Heartrate 97-131    Ratings of Perceived Exertion 11-13    Perceived Dyspnea 0-4      Progression   Progression Continue to progress workloads to maintain intensity without signs/symptoms of physical distress.      Resistance Training   Training Prescription Yes    Weight 4lb    Reps 10-15          Perform Capillary Blood Glucose checks as needed.  Exercise Prescription Changes:   Exercise Prescription Changes     Row Name 11/12/23 1500             Response to Exercise   Blood Pressure (Admit) 96/54       Blood Pressure (Exercise) 104/54       Blood Pressure (Exit) 92/52       Heart Rate (Admit) 64 bpm       Heart Rate (Exercise) 79 bpm       Heart Rate (Exit) 65 bpm       Oxygen Saturation (Admit) 95 %       Oxygen Saturation (Exercise) 97 %       Oxygen Saturation (Exit) 97 %  Rating of Perceived Exertion (Exercise) 13       Perceived Dyspnea (Exercise) 3       Symptoms left ankle pain, brief dizziness, SOB       Comments results          Exercise Comments:   Exercise Goals and Review:   Exercise Goals     Row Name 11/12/23 1516             Exercise Goals   Increase Physical Activity Yes       Intervention Provide advice, education, support and counseling about physical activity/exercise needs.;Develop an individualized exercise prescription for aerobic and resistive training based on initial evaluation findings, risk stratification, comorbidities and participant's personal goals.       Expected Outcomes Short Term: Attend rehab on a regular basis to increase amount of physical  activity.;Long Term: Add in home exercise to make exercise part of routine and to increase amount of physical activity.;Long Term: Exercising regularly at least 3-5 days a week.       Increase Strength and Stamina Yes       Intervention Provide advice, education, support and counseling about physical activity/exercise needs.;Develop an individualized exercise prescription for aerobic and resistive training based on initial evaluation findings, risk stratification, comorbidities and participant's personal goals.       Expected Outcomes Short Term: Increase workloads from initial exercise prescription for resistance, speed, and METs.;Short Term: Perform resistance training exercises routinely during rehab and add in resistance training at home;Long Term: Improve cardiorespiratory fitness, muscular endurance and strength as measured by increased METs and functional capacity ( )       Able to understand and use rate of perceived exertion (RPE) scale Yes       Intervention Provide education and explanation on how to use RPE scale       Expected Outcomes Short Term: Able to use RPE daily in rehab to express subjective intensity level;Long Term:  Able to use RPE to guide intensity level when exercising independently       Able to understand and use Dyspnea scale Yes       Intervention Provide education and explanation on how to use Dyspnea scale       Expected Outcomes Short Term: Able to use Dyspnea scale daily in rehab to express subjective sense of shortness of breath during exertion;Long Term: Able to use Dyspnea scale to guide intensity level when exercising independently       Knowledge and understanding of Target Heart Rate Range (THRR) Yes       Intervention Provide education and explanation of THRR including how the numbers were predicted and where they are located for reference       Expected Outcomes Short Term: Able to state/look up THRR;Long Term: Able to use THRR to govern intensity when  exercising independently       Able to check pulse independently Yes       Intervention Provide education and demonstration on how to check pulse in carotid and radial arteries.;Review the importance of being able to check your own pulse for safety during independent exercise       Expected Outcomes Short Term: Able to explain why pulse checking is important during independent exercise;Long Term: Able to check pulse independently and accurately       Understanding of Exercise Prescription Yes       Intervention Provide education, explanation, and written materials on patient's individual exercise prescription  Expected Outcomes Short Term: Able to explain program exercise prescription;Long Term: Able to explain home exercise prescription to exercise independently          Exercise Goals Re-Evaluation :   Discharge Exercise Prescription (Final Exercise Prescription Changes):  Exercise Prescription Changes - 11/12/23 1500       Response to Exercise   Blood Pressure (Admit) 96/54    Blood Pressure (Exercise) 104/54    Blood Pressure (Exit) 92/52    Heart Rate (Admit) 64 bpm    Heart Rate (Exercise) 79 bpm    Heart Rate (Exit) 65 bpm    Oxygen Saturation (Admit) 95 %    Oxygen Saturation (Exercise) 97 %    Oxygen Saturation (Exit) 97 %    Rating of Perceived Exertion (Exercise) 13    Perceived Dyspnea (Exercise) 3    Symptoms left ankle pain, brief dizziness, SOB    Comments results          Nutrition:  Target Goals: Understanding of nutrition guidelines, daily intake of sodium 1500mg , cholesterol 200mg , calories 30% from fat and 7% or less from saturated fats, daily to have 5 or more servings of fruits and vegetables.  Education: Nutrition 1 -Group instruction provided by verbal, written material, interactive activities, discussions, models, and posters to present general guidelines for heart healthy nutrition including macronutrients, label reading, and promoting  whole foods over processed counterparts. Education serves as Pensions consultant of discussion of heart healthy eating for all. Written material provided at class time.    Education: Nutrition 2 -Group instruction provided by verbal, written material, interactive activities, discussions, models, and posters to present general guidelines for heart healthy nutrition including sodium, cholesterol, and saturated fat. Providing guidance of habit forming to improve blood pressure, cholesterol, and body weight. Written material provided at class time.     Biometrics:  Pre Biometrics - 11/12/23 1516       Pre Biometrics   Height 5' 1 (1.549 m)    Weight 163 lb (73.9 kg)    Waist Circumference 37.5 inches    Hip Circumference 39 inches    Waist to Hip Ratio 0.96 %    BMI (Calculated) 30.81    Single Leg Stand 11.41 seconds           Nutrition Therapy Plan and Nutrition Goals:   Nutrition Assessments:  MEDIFICTS Score Key: >=70 Need to make dietary changes  40-70 Heart Healthy Diet <= 40 Therapeutic Level Cholesterol Diet   Picture Your Plate Scores: <59 Unhealthy dietary pattern with much room for improvement. 41-50 Dietary pattern unlikely to meet recommendations for good health and room for improvement. 51-60 More healthful dietary pattern, with some room for improvement.  >60 Healthy dietary pattern, although there may be some specific behaviors that could be improved.    Nutrition Goals Re-Evaluation:   Nutrition Goals Discharge (Final Nutrition Goals Re-Evaluation):   Psychosocial: Target Goals: Acknowledge presence or absence of significant depression and/or stress, maximize coping skills, provide positive support system. Participant is able to verbalize types and ability to use techniques and skills needed for reducing stress and depression.   Education: Stress, Anxiety, and Depression - Group verbal and visual presentation to define topics covered.  Reviews how body is  impacted by stress, anxiety, and depression.  Also discusses healthy ways to reduce stress and to treat/manage anxiety and depression. Written material provided at class time.   Education: Sleep Hygiene -Provides group verbal and written instruction about how sleep can affect your health.  Define sleep hygiene, discuss sleep cycles and impact of sleep habits. Review good sleep hygiene tips.   Initial Review & Psychosocial Screening:  Initial Psych Review & Screening - 11/12/23 1357       Initial Review   Current issues with Current Stress Concerns    Source of Stress Concerns Chronic Illness      Family Dynamics   Good Support System? Yes      Barriers   Psychosocial barriers to participate in program There are no identifiable barriers or psychosocial needs.;The patient should benefit from training in stress management and relaxation.      Screening Interventions   Interventions Encouraged to exercise;Provide feedback about the scores to participant;To provide support and resources with identified psychosocial needs    Expected Outcomes Short Term goal: Utilizing psychosocial counselor, staff and physician to assist with identification of specific Stressors or current issues interfering with healing process. Setting desired goal for each stressor or current issue identified.;Long Term Goal: Stressors or current issues are controlled or eliminated.;Short Term goal: Identification and review with participant of any Quality of Life or Depression concerns found by scoring the questionnaire.;Long Term goal: The participant improves quality of Life and PHQ9 Scores as seen by post scores and/or verbalization of changes          Quality of Life Scores:   Quality of Life - 11/12/23 1523       Quality of Life   Select Quality of Life      Quality of Life Scores   Health/Function Pre 6.1 %    Socioeconomic Pre 20.06 %    Psych/Spiritual Pre 18.43 %    Family Pre 20.4 %    GLOBAL Pre  13.8 %         Scores of 19 and below usually indicate a poorer quality of life in these areas.  A difference of  2-3 points is a clinically meaningful difference.  A difference of 2-3 points in the total score of the Quality of Life Index has been associated with significant improvement in overall quality of life, self-image, physical symptoms, and general health in studies assessing change in quality of life.  PHQ-9: Review Flowsheet  More data exists      11/12/2023 08/13/2023 12/26/2022 10/24/2022 08/07/2022  Depression screen PHQ 2/9  Decreased Interest 1 0 0 0 1  Down, Depressed, Hopeless 1 0 0 1 1  PHQ - 2 Score 2 0 0 1 2  Altered sleeping 0 - 0 0 1  Tired, decreased energy 2 - 0 1 1  Change in appetite 0 - 0 0 0  Feeling bad or failure about yourself  0 - 0 0 0  Trouble concentrating 0 - 0 0 0  Moving slowly or fidgety/restless 1 - 0 0 0  Suicidal thoughts 1 - 0 3 0  PHQ-9 Score 6 - 0 5 4  Difficult doing work/chores Not difficult at all - Not difficult at all Somewhat difficult Not difficult at all   Interpretation of Total Score  Total Score Depression Severity:  1-4 = Minimal depression, 5-9 = Mild depression, 10-14 = Moderate depression, 15-19 = Moderately severe depression, 20-27 = Severe depression   Psychosocial Evaluation and Intervention:  Psychosocial Evaluation - 11/12/23 1357       Psychosocial Evaluation & Interventions   Interventions Encouraged to exercise with the program and follow exercise prescription    Comments Mr. Lentsch is coming to cardiac rehab with systolic HF. He is accompanied  by a family member who helps translate. When asked about stress, he mentioned some days he feels more stressed than others. His stress stems from his shortness of breath and disease process with heart failure. He reports good sleep habits.    Expected Outcomes Short: attend cardiac rehab for education and exercise Long: develop and maintain positive self care habits     Continue Psychosocial Services  Follow up required by staff          Psychosocial Re-Evaluation:   Psychosocial Discharge (Final Psychosocial Re-Evaluation):   Vocational Rehabilitation: Provide vocational rehab assistance to qualifying candidates.   Vocational Rehab Evaluation & Intervention:  Vocational Rehab - 11/12/23 1357       Initial Vocational Rehab Evaluation & Intervention   Assessment shows need for Vocational Rehabilitation No          Education: Education Goals: Education classes will be provided on a variety of topics geared toward better understanding of heart health and risk factor modification. Participant will state understanding/return demonstration of topics presented as noted by education test scores.  Learning Barriers/Preferences:  Learning Barriers/Preferences - 11/12/23 1357       Learning Barriers/Preferences   Learning Barriers Language    Learning Preferences Individual Instruction          General Cardiac Education Topics:  AED/CPR: - Group verbal and written instruction with the use of models to demonstrate the basic use of the AED with the basic ABC's of resuscitation.   Test and Procedures: - Group verbal and visual presentation and models provide information about basic cardiac anatomy and function. Reviews the testing methods done to diagnose heart disease and the outcomes of the test results. Describes the treatment choices: Medical Management, Angioplasty, or Coronary Bypass Surgery for treating various heart conditions including Myocardial Infarction, Angina, Valve Disease, and Cardiac Arrhythmias. Written material provided at class time.   Medication Safety: - Group verbal and visual instruction to review commonly prescribed medications for heart and lung disease. Reviews the medication, class of the drug, and side effects. Includes the steps to properly store meds and maintain the prescription regimen. Written material  provided at class time.   Intimacy: - Group verbal instruction through game format to discuss how heart and lung disease can affect sexual intimacy. Written material provided at class time.   Know Your Numbers and Heart Failure: - Group verbal and visual instruction to discuss disease risk factors for cardiac and pulmonary disease and treatment options.  Reviews associated critical values for Overweight/Obesity, Hypertension, Cholesterol, and Diabetes.  Discusses basics of heart failure: signs/symptoms and treatments.  Introduces Heart Failure Zone chart for action plan for heart failure. Written material provided at class time.   Infection Prevention: - Provides verbal and written material to individual with discussion of infection control including proper hand washing and proper equipment cleaning during exercise session. Flowsheet Row Cardiac Rehab from 11/12/2023 in [facility] Cardiac and Pulmonary Rehab  Date 11/12/23  Educator MJC  Instruction Review Code 1- Verbalizes Understanding    Falls Prevention: - Provides verbal and written material to individual with discussion of falls prevention and safety. Flowsheet Row Cardiac Rehab from 11/12/2023 in [facility] Cardiac and Pulmonary Rehab  Date 11/12/23  Educator MJC  Instruction Review Code 1- Verbalizes Understanding    Other: -Provides group and verbal instruction on various topics (see comments)   Knowledge Questionnaire Score:   Core Components/Risk Factors/Patient Goals at Admission:  Personal Goals and Risk Factors at Admission - 11/12/23 1356  Core Components/Risk Factors/Patient Goals on Admission    Weight Management Weight Maintenance;Yes    Intervention Weight Management: Develop a combined nutrition and exercise program designed to reach desired caloric intake, while maintaining appropriate intake of nutrient and fiber, sodium and fats, and appropriate energy expenditure required for the weight goal.;Weight Management:  Provide education and appropriate resources to help participant work on and attain dietary goals.    Expected Outcomes Short Term: Continue to assess and modify interventions until short term weight is achieved;Weight Maintenance: Understanding of the daily nutrition guidelines, which includes 25-35% calories from fat, 7% or less cal from saturated fats, less than 200mg  cholesterol, less than 1.5gm of sodium, & 5 or more servings of fruits and vegetables daily;Long Term: Adherence to nutrition and physical activity/exercise program aimed toward attainment of established weight goal;Understanding recommendations for meals to include 15-35% energy as protein, 25-35% energy from fat, 35-60% energy from carbohydrates, less than 200mg  of dietary cholesterol, 20-35 gm of total fiber daily;Understanding of distribution of calorie intake throughout the day with the consumption of 4-5 meals/snacks    Diabetes Yes    Intervention Provide education about signs/symptoms and action to take for hypo/hyperglycemia.;Provide education about proper nutrition, including hydration, and aerobic/resistive exercise prescription along with prescribed medications to achieve blood glucose in normal ranges: Fasting glucose 65-99 mg/dL    Expected Outcomes Short Term: Participant verbalizes understanding of the signs/symptoms and immediate care of hyper/hypoglycemia, proper foot care and importance of medication, aerobic/resistive exercise and nutrition plan for blood glucose control.;Long Term: Attainment of HbA1C < 7%.    Heart Failure Yes    Intervention Provide a combined exercise and nutrition program that is supplemented with education, support and counseling about heart failure. Directed toward relieving symptoms such as shortness of breath, decreased exercise tolerance, and extremity edema.    Expected Outcomes Improve functional capacity of life;Short term: Attendance in program 2-3 days a week with increased exercise capacity.  Reported lower sodium intake. Reported increased fruit and vegetable intake. Reports medication compliance.;Short term: Daily weights obtained and reported for increase. Utilizing diuretic protocols set by physician.;Long term: Adoption of self-care skills and reduction of barriers for early signs and symptoms recognition and intervention leading to self-care maintenance.    Lipids Yes    Intervention Provide education and support for participant on nutrition & aerobic/resistive exercise along with prescribed medications to achieve LDL 70mg , HDL >40mg .    Expected Outcomes Short Term: Participant states understanding of desired cholesterol values and is compliant with medications prescribed. Participant is following exercise prescription and nutrition guidelines.;Long Term: Cholesterol controlled with medications as prescribed, with individualized exercise RX and with personalized nutrition plan. Value goals: LDL < 70mg , HDL > 40 mg.          Education:Diabetes - Individual verbal and written instruction to review signs/symptoms of diabetes, desired ranges of glucose level fasting, after meals and with exercise. Acknowledge that pre and post exercise glucose checks will be done for 3 sessions at entry of program. Flowsheet Row Cardiac Rehab from 11/12/2023 in [facility] Cardiac and Pulmonary Rehab  Date 11/12/23  Educator MJC  Instruction Review Code 1- Verbalizes Understanding    Core Components/Risk Factors/Patient Goals Review:    Core Components/Risk Factors/Patient Goals at Discharge (Final Review):    ITP Comments:  ITP Comments     Row Name 11/12/23 1350 11/12/23 1509         ITP Comments Initial orientation completed. Diagnosis can be found in [facility] 8/12. EP orientation today  at 2pm. Completed and gym orientation for cardiac rehab. Initial ITP created and sent for review to Dr. Fuad Aleskerov, Medical Director.         Comments: Initial ITP

## 2023-11-16 ENCOUNTER — Encounter: Admitting: Emergency Medicine

## 2023-11-16 DIAGNOSIS — I5022 Chronic systolic (congestive) heart failure: Secondary | ICD-10-CM

## 2023-11-16 LAB — GLUCOSE, CAPILLARY
Glucose-Capillary: 121 mg/dL — ABNORMAL HIGH (ref 70–99)
Glucose-Capillary: 119 mg/dL — ABNORMAL HIGH (ref 70–99)

## 2023-11-16 NOTE — Progress Notes (Signed)
 Daily Session Note  Patient Details  Name: Ross Riley MRN: [id number] Date of Birth: 11/30/1950 Referring Provider:   Flowsheet Row Cardiac Rehab from 11/12/2023 in [facility] Cardiac and Pulmonary Rehab  Referring Provider Dr. Ezra Shuck    Encounter Date: 11/16/2023  Check In:  Session Check In - 11/16/23 1559       Check-In   Supervising physician immediately available to respond to emergencies See telemetry face sheet for immediately available ER MD    Location [facility]-Cardiac & Pulmonary Rehab    Staff Present Rollene Paterson, MS, Exercise Physiologist;Joseph Rolinda RCP,RRT,BSRT;Karthik Whittinghill RN,BSN;Meredith Tressa RN,BSN    Virtual Visit No    Medication changes reported     No    Fall or balance concerns reported    No    Tobacco Cessation No Change    Warm-up and Cool-down Performed on first and last piece of equipment    Resistance Training Performed Yes    VAD Patient? No    PAD/SET Patient? No      Pain Assessment   Currently in Pain? No/denies             Social History   Tobacco Use  Smoking Status Former   Current packs/day: 0.00   Average packs/day: 1 pack/day for 32.0 years (32.0 ttl pk-yrs)   Types: Cigarettes   Start date: 03/04/1959   Quit date: 03/04/1991   Years since quitting: 32.7  Smokeless Tobacco Former    Goals Met:  Independence with exercise equipment Exercise tolerated well No report of concerns or symptoms today Strength training completed today  Goals Unmet:  Not Applicable  Comments: First full day of exercise!  Patient was oriented to gym and equipment including functions, settings, policies, and procedures.  Patient's individual exercise prescription and treatment plan were reviewed.  All starting workloads were established based on the results of the 6 minute walk test done at initial orientation visit.  The plan for exercise progression was also introduced and progression will be customized based on patient's performance and  goals.    Dr. Oneil Pinal is Medical Director for [facility] Cardiac Rehabilitation.  Dr. Fuad Aleskerov is Medical Director for [facility] Pulmonary Rehabilitation.

## 2023-11-18 ENCOUNTER — Encounter

## 2023-11-18 DIAGNOSIS — I5022 Chronic systolic (congestive) heart failure: Secondary | ICD-10-CM

## 2023-11-18 LAB — GLUCOSE, CAPILLARY
Glucose-Capillary: 126 mg/dL — ABNORMAL HIGH (ref 70–99)
Glucose-Capillary: 105 mg/dL — ABNORMAL HIGH (ref 70–99)

## 2023-11-18 NOTE — Progress Notes (Signed)
 Daily Session Note  Patient Details  Name: Ross Riley MRN: [id number] Date of Birth: 1950-03-22 Referring Provider:   Flowsheet Row Cardiac Rehab from 11/12/2023 in [facility] Cardiac and Pulmonary Rehab  Referring Provider Dr. Ezra Shuck    Encounter Date: 11/18/2023  Check In:  Session Check In - 11/18/23 1541       Check-In   Supervising physician immediately available to respond to emergencies See telemetry face sheet for immediately available ER MD    Location [facility]-Cardiac & Pulmonary Rehab    Staff Present Burnard Davenport RN,BSN,MPA;Joseph [facility] Dyane BS, ACSM CEP, Exercise Physiologist;Laureen Delores, BS, RRT, CPFT    Virtual Visit No    Medication changes reported     No    Fall or balance concerns reported    No    Tobacco Cessation No Change    Warm-up and Cool-down Performed on first and last piece of equipment    Resistance Training Performed Yes    VAD Patient? No    PAD/SET Patient? No      Pain Assessment   Currently in Pain? No/denies             Social History   Tobacco Use  Smoking Status Former   Current packs/day: 0.00   Average packs/day: 1 pack/day for 32.0 years (32.0 ttl pk-yrs)   Types: Cigarettes   Start date: 03/04/1959   Quit date: 03/04/1991   Years since quitting: 32.7  Smokeless Tobacco Former    Goals Met:  Independence with exercise equipment Exercise tolerated well No report of concerns or symptoms today Strength training completed today  Goals Unmet:  Not Applicable  Comments: Pt able to follow exercise prescription today without complaint.  Will continue to monitor for progression.    Dr. Oneil Pinal is Medical Director for [facility] Cardiac Rehabilitation.  Dr. Fuad Aleskerov is Medical Director for [facility] Pulmonary Rehabilitation.

## 2023-11-19 ENCOUNTER — Encounter: Admitting: Emergency Medicine

## 2023-11-19 DIAGNOSIS — I5022 Chronic systolic (congestive) heart failure: Secondary | ICD-10-CM

## 2023-11-19 LAB — GLUCOSE, CAPILLARY
Glucose-Capillary: 130 mg/dL — ABNORMAL HIGH (ref 70–99)
Glucose-Capillary: 125 mg/dL — ABNORMAL HIGH (ref 70–99)

## 2023-11-19 NOTE — Progress Notes (Signed)
 Daily Session Note  Patient Details  Name: Ross Riley MRN: [id number] Date of Birth: Jul 20, 1950 Referring Provider:   Flowsheet Row Cardiac Rehab from 11/12/2023 in [facility] Cardiac and Pulmonary Rehab  Referring Provider Dr. Ezra Shuck    Encounter Date: 11/19/2023  Check In:  Session Check In - 11/19/23 1545       Check-In   Supervising physician immediately available to respond to emergencies See telemetry face sheet for immediately available ER MD    Location [facility]-Cardiac & Pulmonary Rehab    Staff Present Devaughn Jaeger, BS, Exercise Physiologist;Hades Mathew RN,BSN;Joseph Rolinda RCP,RRT,BSRT    Virtual Visit No    Medication changes reported     No    Fall or balance concerns reported    No    Tobacco Cessation No Change    Warm-up and Cool-down Performed on first and last piece of equipment    Resistance Training Performed Yes    VAD Patient? No    PAD/SET Patient? No      Pain Assessment   Currently in Pain? No/denies             Social History   Tobacco Use  Smoking Status Former   Current packs/day: 0.00   Average packs/day: 1 pack/day for 32.0 years (32.0 ttl pk-yrs)   Types: Cigarettes   Start date: 03/04/1959   Quit date: 03/04/1991   Years since quitting: 32.7  Smokeless Tobacco Former    Goals Met:  Independence with exercise equipment Exercise tolerated well No report of concerns or symptoms today Strength training completed today  Goals Unmet:  Not Applicable  Comments: Pt able to follow exercise prescription today without complaint.  Will continue to monitor for progression.    Dr. Oneil Pinal is Medical Director for [facility] Cardiac Rehabilitation.  Dr. Fuad Aleskerov is Medical Director for [facility] Pulmonary Rehabilitation.

## 2023-11-23 ENCOUNTER — Encounter: Admitting: Emergency Medicine

## 2023-11-23 DIAGNOSIS — I5022 Chronic systolic (congestive) heart failure: Secondary | ICD-10-CM

## 2023-11-23 NOTE — Progress Notes (Signed)
 Daily Session Note  Patient Details  Name: Ross Riley MRN: [id number] Date of Birth: [birth year] Referring Provider:   Flowsheet Row Cardiac Rehab from 11/12/2023 in [facility] Cardiac and Pulmonary Rehab  Referring Provider Dr. Ezra Shuck    Encounter Date: 11/23/2023  Check In:  Session Check In - 11/23/23 1551       Check-In   Supervising physician immediately available to respond to emergencies See telemetry face sheet for immediately available ER MD    Location [facility]-Cardiac & Pulmonary Rehab    Staff Present Rollene Paterson, MS, Exercise Physiologist;Maxon Conetta BS, Exercise Physiologist;Joseph Rolinda RCP,RRT,BSRT;Delois Tolbert RN,BSN    Virtual Visit No    Medication changes reported     No    Fall or balance concerns reported    No    Tobacco Cessation No Change    Warm-up and Cool-down Performed on first and last piece of equipment    Resistance Training Performed Yes    VAD Patient? No    PAD/SET Patient? No      Pain Assessment   Currently in Pain? No/denies             Social History   Tobacco Use  Smoking Status Former   Current packs/day: 0.00   Average packs/day: 1 pack/day for 32.0 years (32.0 ttl pk-yrs)   Types: Cigarettes   Start date: 03/04/1959   Quit date: 03/04/1991   Years since quitting: 32.7  Smokeless Tobacco Former    Goals Met:  Independence with exercise equipment Exercise tolerated well No report of concerns or symptoms today Strength training completed today  Goals Unmet:  Not Applicable  Comments: Pt able to follow exercise prescription today without complaint.  Will continue to monitor for progression.    Dr. Oneil Pinal is Medical Director for [facility] Cardiac Rehabilitation.  Dr. Fuad Aleskerov is Medical Director for [facility] Pulmonary Rehabilitation.

## 2023-11-25 ENCOUNTER — Encounter

## 2023-11-25 DIAGNOSIS — I5022 Chronic systolic (congestive) heart failure: Secondary | ICD-10-CM

## 2023-11-25 NOTE — Progress Notes (Signed)
 Cardiac Individual Treatment Plan  Patient Details  Name: AEDIN JEANSONNE MRN: [id number] Date of Birth: 1950/07/28 Referring Provider:   Flowsheet Row Cardiac Rehab from 11/12/2023 in [facility] Cardiac and Pulmonary Rehab  Referring Provider Dr. Ezra Shuck    Initial Encounter Date:  Flowsheet Row Cardiac Rehab from 11/12/2023 in [facility] Cardiac and Pulmonary Rehab  Date 11/12/23    Visit Diagnosis: Heart failure, chronic systolic (HCC)  Patient's Home Medications on Admission:  Current Outpatient Medications:    ACCU-CHEK AVIVA PLUS test strip, Use to check blood sugar up to 1 x per day, Disp: 100 each, Rfl: 12   Accu-Chek Softclix Lancets lancets, Use to check blood sugar up to 1 x per day, Disp: 100 each, Rfl: 12   atorvastatin  (LIPITOR ) 80 MG tablet, Take 1 tablet (80 mg total) by mouth at bedtime., Disp: 90 tablet, Rfl: 3   Blood Glucose Monitoring Suppl (ACCU-CHEK AVIVA PLUS) w/Device KIT, Use to check blood sugar up to 1 x per day, Disp: 1 kit, Rfl: 0   carvedilol  (COREG ) 6.25 MG tablet, TAKE 1 TABLET(6.25 MG) BY MOUTH TWICE DAILY WITH A MEAL, Disp: 60 tablet, Rfl: 3   dorzolamide -timolol  (COSOPT ) 22.3-6.8 MG/ML ophthalmic solution, Place 1 drop into both eyes 2 (two) times daily., Disp: , Rfl:    ezetimibe  (ZETIA ) 10 MG tablet, Take 1 tablet (10 mg total) by mouth daily., Disp: 90 tablet, Rfl: 2   finasteride  (PROSCAR ) 5 MG tablet, Take 1 tablet (5 mg total) by mouth daily., Disp: 90 tablet, Rfl: 3   folic acid  (FOLVITE ) 1 MG tablet, Take 1 tablet (1 mg total) by mouth daily., Disp: 30 tablet, Rfl: 2   gabapentin  (NEURONTIN ) 600 MG tablet, Take 1 tablet (600 mg total) by mouth 3 (three) times daily., Disp: 270 tablet, Rfl: 3   isosorbide  mononitrate (IMDUR ) 30 MG 24 hr tablet, TAKE 1/2 TABLET(15 MG) BY MOUTH DAILY, Disp: 45 tablet, Rfl: 3   JARDIANCE  25 MG TABS tablet, Take 25 mg by mouth daily., Disp: , Rfl:    latanoprost  (XALATAN ) 0.005 % ophthalmic solution, Place 1 drop into  both eyes at bedtime., Disp: , Rfl: 6   levothyroxine  (SYNTHROID ) 75 MCG tablet, Take 75 mcg by mouth daily before breakfast., Disp: , Rfl:    Multiple Vitamin (MULTIVITAMIN WITH MINERALS) TABS tablet, Take 1 tablet by mouth daily., Disp: 30 tablet, Rfl: 2   nitroGLYCERIN  (NITROSTAT ) 0.4 MG SL tablet, Place 1 tablet (0.4 mg total) under the tongue every 5 (five) minutes as needed for chest pain., Disp: 25 tablet, Rfl: 2   omeprazole  (PRILOSEC) 40 MG capsule, Take 1 capsule (40 mg total) by mouth daily before breakfast., Disp: 90 capsule, Rfl: 1   sacubitril -valsartan  (ENTRESTO ) 24-26 MG, Take 1 tablet by mouth 2 (two) times daily., Disp: 180 tablet, Rfl: 3   spironolactone  (ALDACTONE ) 25 MG tablet, Take 0.5 tablets (12.5 mg total) by mouth daily., Disp: 90 tablet, Rfl: 3   ticagrelor  (BRILINTA ) 90 MG TABS tablet, TAKE 1 TABLET(90 MG) BY MOUTH TWICE DAILY, Disp: 60 tablet, Rfl: 11  Past Medical History: Past Medical History:  Diagnosis Date   Arthritis    BPH (benign prostatic hyperplasia)    Chronic kidney disease    kidney stones   Dystonia    ED (erectile dysfunction)    GERD (gastroesophageal reflux disease)    Glaucoma (increased eye pressure)    Hematuria    Hemorrhoids    Hyperlipidemia    Hypertension    Stroke (HCC)  Thyroid  disease     Tobacco Use: Social History   Tobacco Use  Smoking Status Former   Current packs/day: 0.00   Average packs/day: 1 pack/day for 32.0 years (32.0 ttl pk-yrs)   Types: Cigarettes   Start date: 03/04/1959   Quit date: 03/04/1991   Years since quitting: 32.7  Smokeless Tobacco Former    Labs: Review Flowsheet  More data exists      Latest Ref Rng & Units 10/02/2022 12/20/2022 04/27/2023 07/01/2023 09/15/2023  Labs for ITP Cardiac and Pulmonary Rehab  Cholestrol 100 - 199 mg/dL - 827  860  882  -  LDL (calc) 0 - 99 mg/dL - 90  69  45  -  HDL-C >39 mg/dL - 49  45  46  -  Trlycerides 0 - 149 mg/dL - 833  823  842  -  Hemoglobin A1c - 7.3      7.0  7.6  - 8.1        Details       This result is from an external source.          Exercise Target Goals: Exercise Program Goal: Individual exercise prescription set using results from initial 6 min walk test and THRR while considering  patient's activity barriers and safety.   Exercise Prescription Goal: Initial exercise prescription builds to 30-45 minutes a day of aerobic activity, 2-3 days per week.  Home exercise guidelines will be given to patient during program as part of exercise prescription that the participant will acknowledge.   Education: Aerobic Exercise: - Group verbal and visual presentation on the components of exercise prescription. Introduces F.I.T.T principle from ACSM for exercise prescriptions.  Reviews F.I.T.T. principles of aerobic exercise including progression. Written material provided at class time.   Education: Resistance Exercise: - Group verbal and visual presentation on the components of exercise prescription. Introduces F.I.T.T principle from ACSM for exercise prescriptions  Reviews F.I.T.T. principles of resistance exercise including progression. Written material provided at class time.    Education: Exercise & Equipment Safety: - Individual verbal instruction and demonstration of equipment use and safety with use of the equipment. Flowsheet Row Cardiac Rehab from 11/12/2023 in [facility] Cardiac and Pulmonary Rehab  Date 11/12/23  Educator MJC  Instruction Review Code 1- Verbalizes Understanding    Education: Exercise Physiology & General Exercise Guidelines: - Group verbal and written instruction with models to review the exercise physiology of the cardiovascular system and associated critical values. Provides general exercise guidelines with specific guidelines to those with heart or lung disease. Written material provided at class time.   Education: Flexibility, Balance, Mind/Body Relaxation: - Group verbal and visual presentation with  interactive activity on the components of exercise prescription. Introduces F.I.T.T principle from ACSM for exercise prescriptions. Reviews F.I.T.T. principles of flexibility and balance exercise training including progression. Also discusses the mind body connection.  Reviews various relaxation techniques to help reduce and manage stress (i.e. Deep breathing, progressive muscle relaxation, and visualization). Balance handout provided to take home. Written material provided at class time.   Activity Barriers & Risk Stratification:  Activity Barriers & Cardiac Risk Stratification - 11/12/23 1512       Activity Barriers & Cardiac Risk Stratification   Activity Barriers Joint Problems;Deconditioning;Shortness of Breath;Other (comment)    Comments Left ankle pains/ mobility issues, shortness of breath.    Cardiac Risk Stratification High          6 Minute Walk:  6 Minute Walk     Row  Name 11/12/23 1510         6 Minute Walk   Phase Initial     Distance 380 feet     Walk Time 5.6 minutes     # of Rest Breaks 2     MPH 0.7     METS 1     RPE 13     Perceived Dyspnea  3     VO2 Peak 1.9     Symptoms Yes (comment)     Comments Dyspnea, left ankle pain, brief spell of dizziness     Resting HR 64 bpm     Resting BP 96/54     Resting Oxygen Saturation  95 %     Exercise Oxygen Saturation  during 6 min walk 97 %     Max Ex. HR 79 bpm     Max Ex. BP 104/54     2 Minute Post BP 92/52        Oxygen Initial Assessment:   Oxygen Re-Evaluation:   Oxygen Discharge (Final Oxygen Re-Evaluation):   Initial Exercise Prescription:  Initial Exercise Prescription - 11/12/23 1500       Date of Initial Exercise RX and Referring Provider   Date 11/12/23    Referring Provider Dr. Ezra Shuck      Oxygen   Maintain Oxygen Saturation 88% or higher      Recumbant Bike   Level 1    RPM 50    Watts 25    Minutes 15    METs 2      NuStep   Level 1    SPM 80    Minutes 15     METs 2      T5 Nustep   Level 1    SPM 80    Minutes 15    METs 2      Biostep-RELP   Level 1    SPM 50    Minutes 15    METs 2      Track   Laps 10    Minutes 15    METs 1.54      Prescription Details   Duration Progress to 30 minutes of continuous aerobic without signs/symptoms of physical distress      Intensity   THRR 40-80% of Max Heartrate 97-131    Ratings of Perceived Exertion 11-13    Perceived Dyspnea 0-4      Progression   Progression Continue to progress workloads to maintain intensity without signs/symptoms of physical distress.      Resistance Training   Training Prescription Yes    Weight 4lb    Reps 10-15          Perform Capillary Blood Glucose checks as needed.  Exercise Prescription Changes:   Exercise Prescription Changes     Row Name 11/12/23 1500             Response to Exercise   Blood Pressure (Admit) 96/54       Blood Pressure (Exercise) 104/54       Blood Pressure (Exit) 92/52       Heart Rate (Admit) 64 bpm       Heart Rate (Exercise) 79 bpm       Heart Rate (Exit) 65 bpm       Oxygen Saturation (Admit) 95 %       Oxygen Saturation (Exercise) 97 %       Oxygen Saturation (Exit) 97 %  Rating of Perceived Exertion (Exercise) 13       Perceived Dyspnea (Exercise) 3       Symptoms left ankle pain, brief dizziness, SOB       Comments results          Exercise Comments:   Exercise Comments     Row Name 11/16/23 1559           Exercise Comments First full day of exercise!  Patient was oriented to gym and equipment including functions, settings, policies, and procedures.  Patient's individual exercise prescription and treatment plan were reviewed.  All starting workloads were established based on the results of the 6 minute walk test done at initial orientation visit.  The plan for exercise progression was also introduced and progression will be customized based on patient's performance and goals.           Exercise Goals and Review:   Exercise Goals     Row Name 11/12/23 1516             Exercise Goals   Increase Physical Activity Yes       Intervention Provide advice, education, support and counseling about physical activity/exercise needs.;Develop an individualized exercise prescription for aerobic and resistive training based on initial evaluation findings, risk stratification, comorbidities and participant's personal goals.       Expected Outcomes Short Term: Attend rehab on a regular basis to increase amount of physical activity.;Long Term: Add in home exercise to make exercise part of routine and to increase amount of physical activity.;Long Term: Exercising regularly at least 3-5 days a week.       Increase Strength and Stamina Yes       Intervention Provide advice, education, support and counseling about physical activity/exercise needs.;Develop an individualized exercise prescription for aerobic and resistive training based on initial evaluation findings, risk stratification, comorbidities and participant's personal goals.       Expected Outcomes Short Term: Increase workloads from initial exercise prescription for resistance, speed, and METs.;Short Term: Perform resistance training exercises routinely during rehab and add in resistance training at home;Long Term: Improve cardiorespiratory fitness, muscular endurance and strength as measured by increased METs and functional capacity ( )       Able to understand and use rate of perceived exertion (RPE) scale Yes       Intervention Provide education and explanation on how to use RPE scale       Expected Outcomes Short Term: Able to use RPE daily in rehab to express subjective intensity level;Long Term:  Able to use RPE to guide intensity level when exercising independently       Able to understand and use Dyspnea scale Yes       Intervention Provide education and explanation on how to use Dyspnea scale       Expected Outcomes  Short Term: Able to use Dyspnea scale daily in rehab to express subjective sense of shortness of breath during exertion;Long Term: Able to use Dyspnea scale to guide intensity level when exercising independently       Knowledge and understanding of Target Heart Rate Range (THRR) Yes       Intervention Provide education and explanation of THRR including how the numbers were predicted and where they are located for reference       Expected Outcomes Short Term: Able to state/look up THRR;Long Term: Able to use THRR to govern intensity when exercising independently       Able to check pulse independently  Yes       Intervention Provide education and demonstration on how to check pulse in carotid and radial arteries.;Review the importance of being able to check your own pulse for safety during independent exercise       Expected Outcomes Short Term: Able to explain why pulse checking is important during independent exercise;Long Term: Able to check pulse independently and accurately       Understanding of Exercise Prescription Yes       Intervention Provide education, explanation, and written materials on patient's individual exercise prescription       Expected Outcomes Short Term: Able to explain program exercise prescription;Long Term: Able to explain home exercise prescription to exercise independently          Exercise Goals Re-Evaluation :  Exercise Goals Re-Evaluation     Row Name 11/16/23 1600             Exercise Goal Re-Evaluation   Exercise Goals Review Increase Physical Activity;Able to understand and use rate of perceived exertion (RPE) scale;Knowledge and understanding of Target Heart Rate Range (THRR);Understanding of Exercise Prescription;Able to understand and use Dyspnea scale;Increase Strength and Stamina;Able to check pulse independently       Comments Reviewed RPE and dyspnea scale, THR and program prescription with pt today.  Pt voiced understanding and was given a copy of  goals to take home.       Expected Outcomes Short: Use RPE daily to regulate intensity.  Long: Follow program prescription in THR.          Discharge Exercise Prescription (Final Exercise Prescription Changes):  Exercise Prescription Changes - 11/12/23 1500       Response to Exercise   Blood Pressure (Admit) 96/54    Blood Pressure (Exercise) 104/54    Blood Pressure (Exit) 92/52    Heart Rate (Admit) 64 bpm    Heart Rate (Exercise) 79 bpm    Heart Rate (Exit) 65 bpm    Oxygen Saturation (Admit) 95 %    Oxygen Saturation (Exercise) 97 %    Oxygen Saturation (Exit) 97 %    Rating of Perceived Exertion (Exercise) 13    Perceived Dyspnea (Exercise) 3    Symptoms left ankle pain, brief dizziness, SOB    Comments results          Nutrition:  Target Goals: Understanding of nutrition guidelines, daily intake of sodium 1500mg , cholesterol 200mg , calories 30% from fat and 7% or less from saturated fats, daily to have 5 or more servings of fruits and vegetables.  Education: Nutrition 1 -Group instruction provided by verbal, written material, interactive activities, discussions, models, and posters to present general guidelines for heart healthy nutrition including macronutrients, label reading, and promoting whole foods over processed counterparts. Education serves as Pensions consultant of discussion of heart healthy eating for all. Written material provided at class time.    Education: Nutrition 2 -Group instruction provided by verbal, written material, interactive activities, discussions, models, and posters to present general guidelines for heart healthy nutrition including sodium, cholesterol, and saturated fat. Providing guidance of habit forming to improve blood pressure, cholesterol, and body weight. Written material provided at class time.     Biometrics:  Pre Biometrics - 11/12/23 1516       Pre Biometrics   Height 5' 1 (1.549 m)    Weight 163 lb (73.9 kg)    Waist  Circumference 37.5 inches    Hip Circumference 39 inches    Waist to Hip Ratio 0.96 %  BMI (Calculated) 30.81    Single Leg Stand 11.41 seconds           Nutrition Therapy Plan and Nutrition Goals:   Nutrition Assessments:  MEDIFICTS Score Key: >=70 Need to make dietary changes  40-70 Heart Healthy Diet <= 40 Therapeutic Level Cholesterol Diet   Picture Your Plate Scores: <59 Unhealthy dietary pattern with much room for improvement. 41-50 Dietary pattern unlikely to meet recommendations for good health and room for improvement. 51-60 More healthful dietary pattern, with some room for improvement.  >60 Healthy dietary pattern, although there may be some specific behaviors that could be improved.    Nutrition Goals Re-Evaluation:   Nutrition Goals Discharge (Final Nutrition Goals Re-Evaluation):   Psychosocial: Target Goals: Acknowledge presence or absence of significant depression and/or stress, maximize coping skills, provide positive support system. Participant is able to verbalize types and ability to use techniques and skills needed for reducing stress and depression.   Education: Stress, Anxiety, and Depression - Group verbal and visual presentation to define topics covered.  Reviews how body is impacted by stress, anxiety, and depression.  Also discusses healthy ways to reduce stress and to treat/manage anxiety and depression. Written material provided at class time.   Education: Sleep Hygiene -Provides group verbal and written instruction about how sleep can affect your health.  Define sleep hygiene, discuss sleep cycles and impact of sleep habits. Review good sleep hygiene tips.   Initial Review & Psychosocial Screening:  Initial Psych Review & Screening - 11/12/23 1357       Initial Review   Current issues with Current Stress Concerns    Source of Stress Concerns Chronic Illness      Family Dynamics   Good Support System? Yes      Barriers    Psychosocial barriers to participate in program There are no identifiable barriers or psychosocial needs.;The patient should benefit from training in stress management and relaxation.      Screening Interventions   Interventions Encouraged to exercise;Provide feedback about the scores to participant;To provide support and resources with identified psychosocial needs    Expected Outcomes Short Term goal: Utilizing psychosocial counselor, staff and physician to assist with identification of specific Stressors or current issues interfering with healing process. Setting desired goal for each stressor or current issue identified.;Long Term Goal: Stressors or current issues are controlled or eliminated.;Short Term goal: Identification and review with participant of any Quality of Life or Depression concerns found by scoring the questionnaire.;Long Term goal: The participant improves quality of Life and PHQ9 Scores as seen by post scores and/or verbalization of changes          Quality of Life Scores:   Quality of Life - 11/12/23 1523       Quality of Life   Select Quality of Life      Quality of Life Scores   Health/Function Pre 6.1 %    Socioeconomic Pre 20.06 %    Psych/Spiritual Pre 18.43 %    Family Pre 20.4 %    GLOBAL Pre 13.8 %         Scores of 19 and below usually indicate a poorer quality of life in these areas.  A difference of  2-3 points is a clinically meaningful difference.  A difference of 2-3 points in the total score of the Quality of Life Index has been associated with significant improvement in overall quality of life, self-image, physical symptoms, and general health in studies assessing change in quality  of life.  PHQ-9: Review Flowsheet  More data exists      11/12/2023 08/13/2023 12/26/2022 10/24/2022 08/07/2022  Depression screen PHQ 2/9  Decreased Interest 1 0 0 0 1  Down, Depressed, Hopeless 1 0 0 1 1  PHQ - 2 Score 2 0 0 1 2  Altered sleeping 0 - 0 0 1  Tired,  decreased energy 2 - 0 1 1  Change in appetite 0 - 0 0 0  Feeling bad or failure about yourself  0 - 0 0 0  Trouble concentrating 0 - 0 0 0  Moving slowly or fidgety/restless 1 - 0 0 0  Suicidal thoughts 1 - 0 3 0  PHQ-9 Score 6 - 0 5 4  Difficult doing work/chores Not difficult at all - Not difficult at all Somewhat difficult Not difficult at all   Interpretation of Total Score  Total Score Depression Severity:  1-4 = Minimal depression, 5-9 = Mild depression, 10-14 = Moderate depression, 15-19 = Moderately severe depression, 20-27 = Severe depression   Psychosocial Evaluation and Intervention:  Psychosocial Evaluation - 11/12/23 1357       Psychosocial Evaluation & Interventions   Interventions Encouraged to exercise with the program and follow exercise prescription    Comments Mr. Signore is coming to cardiac rehab with systolic HF. He is accompanied by a family member who helps translate. When asked about stress, he mentioned some days he feels more stressed than others. His stress stems from his shortness of breath and disease process with heart failure. He reports good sleep habits.    Expected Outcomes Short: attend cardiac rehab for education and exercise Long: develop and maintain positive self care habits    Continue Psychosocial Services  Follow up required by staff          Psychosocial Re-Evaluation:   Psychosocial Discharge (Final Psychosocial Re-Evaluation):   Vocational Rehabilitation: Provide vocational rehab assistance to qualifying candidates.   Vocational Rehab Evaluation & Intervention:  Vocational Rehab - 11/12/23 1357       Initial Vocational Rehab Evaluation & Intervention   Assessment shows need for Vocational Rehabilitation No          Education: Education Goals: Education classes will be provided on a variety of topics geared toward better understanding of heart health and risk factor modification. Participant will state understanding/return  demonstration of topics presented as noted by education test scores.  Learning Barriers/Preferences:  Learning Barriers/Preferences - 11/12/23 1357       Learning Barriers/Preferences   Learning Barriers Language    Learning Preferences Individual Instruction          General Cardiac Education Topics:  AED/CPR: - Group verbal and written instruction with the use of models to demonstrate the basic use of the AED with the basic ABC's of resuscitation.   Test and Procedures: - Group verbal and visual presentation and models provide information about basic cardiac anatomy and function. Reviews the testing methods done to diagnose heart disease and the outcomes of the test results. Describes the treatment choices: Medical Management, Angioplasty, or Coronary Bypass Surgery for treating various heart conditions including Myocardial Infarction, Angina, Valve Disease, and Cardiac Arrhythmias. Written material provided at class time.   Medication Safety: - Group verbal and visual instruction to review commonly prescribed medications for heart and lung disease. Reviews the medication, class of the drug, and side effects. Includes the steps to properly store meds and maintain the prescription regimen. Written material provided at class time.  Intimacy: - Group verbal instruction through game format to discuss how heart and lung disease can affect sexual intimacy. Written material provided at class time.   Know Your Numbers and Heart Failure: - Group verbal and visual instruction to discuss disease risk factors for cardiac and pulmonary disease and treatment options.  Reviews associated critical values for Overweight/Obesity, Hypertension, Cholesterol, and Diabetes.  Discusses basics of heart failure: signs/symptoms and treatments.  Introduces Heart Failure Zone chart for action plan for heart failure. Written material provided at class time.   Infection Prevention: - Provides verbal and  written material to individual with discussion of infection control including proper hand washing and proper equipment cleaning during exercise session. Flowsheet Row Cardiac Rehab from 11/12/2023 in [facility] Cardiac and Pulmonary Rehab  Date 11/12/23  Educator MJC  Instruction Review Code 1- Verbalizes Understanding    Falls Prevention: - Provides verbal and written material to individual with discussion of falls prevention and safety. Flowsheet Row Cardiac Rehab from 11/12/2023 in [facility] Cardiac and Pulmonary Rehab  Date 11/12/23  Educator MJC  Instruction Review Code 1- Verbalizes Understanding    Other: -Provides group and verbal instruction on various topics (see comments)   Knowledge Questionnaire Score:   Core Components/Risk Factors/Patient Goals at Admission:  Personal Goals and Risk Factors at Admission - 11/12/23 1356       Core Components/Risk Factors/Patient Goals on Admission    Weight Management Weight Maintenance;Yes    Intervention Weight Management: Develop a combined nutrition and exercise program designed to reach desired caloric intake, while maintaining appropriate intake of nutrient and fiber, sodium and fats, and appropriate energy expenditure required for the weight goal.;Weight Management: Provide education and appropriate resources to help participant work on and attain dietary goals.    Expected Outcomes Short Term: Continue to assess and modify interventions until short term weight is achieved;Weight Maintenance: Understanding of the daily nutrition guidelines, which includes 25-35% calories from fat, 7% or less cal from saturated fats, less than 200mg  cholesterol, less than 1.5gm of sodium, & 5 or more servings of fruits and vegetables daily;Long Term: Adherence to nutrition and physical activity/exercise program aimed toward attainment of established weight goal;Understanding recommendations for meals to include 15-35% energy as protein, 25-35% energy from fat,  35-60% energy from carbohydrates, less than 200mg  of dietary cholesterol, 20-35 gm of total fiber daily;Understanding of distribution of calorie intake throughout the day with the consumption of 4-5 meals/snacks    Diabetes Yes    Intervention Provide education about signs/symptoms and action to take for hypo/hyperglycemia.;Provide education about proper nutrition, including hydration, and aerobic/resistive exercise prescription along with prescribed medications to achieve blood glucose in normal ranges: Fasting glucose 65-99 mg/dL    Expected Outcomes Short Term: Participant verbalizes understanding of the signs/symptoms and immediate care of hyper/hypoglycemia, proper foot care and importance of medication, aerobic/resistive exercise and nutrition plan for blood glucose control.;Long Term: Attainment of HbA1C < 7%.    Heart Failure Yes    Intervention Provide a combined exercise and nutrition program that is supplemented with education, support and counseling about heart failure. Directed toward relieving symptoms such as shortness of breath, decreased exercise tolerance, and extremity edema.    Expected Outcomes Improve functional capacity of life;Short term: Attendance in program 2-3 days a week with increased exercise capacity. Reported lower sodium intake. Reported increased fruit and vegetable intake. Reports medication compliance.;Short term: Daily weights obtained and reported for increase. Utilizing diuretic protocols set by physician.;Long term: Adoption of self-care skills and reduction  of barriers for early signs and symptoms recognition and intervention leading to self-care maintenance.    Lipids Yes    Intervention Provide education and support for participant on nutrition & aerobic/resistive exercise along with prescribed medications to achieve LDL 70mg , HDL >40mg .    Expected Outcomes Short Term: Participant states understanding of desired cholesterol values and is compliant with  medications prescribed. Participant is following exercise prescription and nutrition guidelines.;Long Term: Cholesterol controlled with medications as prescribed, with individualized exercise RX and with personalized nutrition plan. Value goals: LDL < 70mg , HDL > 40 mg.          Education:Diabetes - Individual verbal and written instruction to review signs/symptoms of diabetes, desired ranges of glucose level fasting, after meals and with exercise. Acknowledge that pre and post exercise glucose checks will be done for 3 sessions at entry of program. Flowsheet Row Cardiac Rehab from 11/12/2023 in [facility] Cardiac and Pulmonary Rehab  Date 11/12/23  Educator [facility]  Instruction Review Code 1- Verbalizes Understanding    Core Components/Risk Factors/Patient Goals Review:    Core Components/Risk Factors/Patient Goals at Discharge (Final Review):    ITP Comments:  ITP Comments     Row Name 11/12/23 1350 11/12/23 1509 11/16/23 1559 11/25/23 0953     ITP Comments Initial orientation completed. Diagnosis can be found in [facility] 8/12. EP orientation today at 2pm. Completed and gym orientation for cardiac rehab. Initial ITP created and sent for review to Dr. Fuad Aleskerov, Medical Director. First full day of exercise!  Patient was oriented to gym and equipment including functions, settings, policies, and procedures.  Patient's individual exercise prescription and treatment plan were reviewed.  All starting workloads were established based on the results of the 6 minute walk test done at initial orientation visit.  The plan for exercise progression was also introduced and progression will be customized based on patient's performance and goals. 30 Day review completed. Medical Director ITP review done, changes made as directed, and signed approval by Medical Director. New to Program.       Comments: 30 day review

## 2023-11-26 ENCOUNTER — Encounter: Admitting: *Deleted

## 2023-11-26 DIAGNOSIS — I5022 Chronic systolic (congestive) heart failure: Secondary | ICD-10-CM

## 2023-11-26 NOTE — Progress Notes (Signed)
 Daily Session Note  Patient Details  Name: Ross Riley MRN: [id number] Date of Birth: Dec 30, 1950 Referring Provider:   Flowsheet Row Cardiac Rehab from 11/12/2023 in [facility] Cardiac and Pulmonary Rehab  Referring Provider Dr. Ezra Shuck    Encounter Date: 11/26/2023  Check In:  Session Check In - 11/26/23 1533       Check-In   Supervising physician immediately available to respond to emergencies See telemetry face sheet for immediately available ER MD    Location [facility]-Cardiac & Pulmonary Rehab    Staff Present Hoy Rodney RN,BSN;Joseph [facility] RCP,RRT,BSRT;Margaret Best, MS, Exercise Physiologist;Noah Tickle, BS, Exercise Physiologist    Virtual Visit No    Medication changes reported     No    Fall or balance concerns reported    No    Warm-up and Cool-down Performed on first and last piece of equipment    Resistance Training Performed Yes    VAD Patient? No    PAD/SET Patient? No      Pain Assessment   Currently in Pain? No/denies             Social History   Tobacco Use  Smoking Status Former   Current packs/day: 0.00   Average packs/day: 1 pack/day for 32.0 years (32.0 ttl pk-yrs)   Types: Cigarettes   Start date: 03/04/1959   Quit date: 03/04/1991   Years since quitting: 32.7  Smokeless Tobacco Former    Goals Met:  Independence with exercise equipment Exercise tolerated well No report of concerns or symptoms today Strength training completed today  Goals Unmet:  Not Applicable  Comments: Pt able to follow exercise prescription today without complaint.  Will continue to monitor for progression.    Dr. Oneil Pinal is Medical Director for [facility] Cardiac Rehabilitation.  Dr. Fuad Aleskerov is Medical Director for [facility] Pulmonary Rehabilitation.

## 2023-11-26 NOTE — Progress Notes (Signed)
Remote ICD Transmission.

## 2023-11-30 ENCOUNTER — Encounter

## 2023-12-02 ENCOUNTER — Encounter: Attending: Cardiology | Admitting: Emergency Medicine

## 2023-12-02 DIAGNOSIS — I5022 Chronic systolic (congestive) heart failure: Secondary | ICD-10-CM | POA: Diagnosis present

## 2023-12-02 NOTE — Progress Notes (Signed)
 Daily Session Note  Patient Details  Name: Ross Riley MRN: [id number] Date of Birth: 02-Feb-1951 Referring Provider:   Flowsheet Row Cardiac Rehab from 11/12/2023 in [facility] Cardiac and Pulmonary Rehab  Referring Provider Dr. Ezra Shuck    Encounter Date: 12/02/2023  Check In:  Session Check In - 12/02/23 1553       Check-In   Supervising physician immediately available to respond to emergencies See telemetry face sheet for immediately available ER MD    Location [facility]-Cardiac & Pulmonary Rehab    Staff Present Fairy Plater RCP,RRT,BSRT;Exander Shaul RN,BSN;Kelly Bollinger RN,BSN,MPA;Meredith Tressa RN,BSN    Virtual Visit No    Medication changes reported     No    Fall or balance concerns reported    No    Tobacco Cessation No Change    Warm-up and Cool-down Performed on first and last piece of equipment    Resistance Training Performed Yes    VAD Patient? No    PAD/SET Patient? No      Pain Assessment   Currently in Pain? No/denies             Social History   Tobacco Use  Smoking Status Former   Current packs/day: 0.00   Average packs/day: 1 pack/day for 32.0 years (32.0 ttl pk-yrs)   Types: Cigarettes   Start date: 03/04/1959   Quit date: 03/04/1991   Years since quitting: 32.7  Smokeless Tobacco Former    Goals Met:  Independence with exercise equipment Exercise tolerated well No report of concerns or symptoms today Strength training completed today  Goals Unmet:  Not Applicable  Comments: Pt able to follow exercise prescription today without complaint.  Will continue to monitor for progression.    Dr. Oneil Pinal is Medical Director for [facility] Cardiac Rehabilitation.  Dr. Fuad Aleskerov is Medical Director for [facility] Pulmonary Rehabilitation.

## 2023-12-03 ENCOUNTER — Encounter: Admitting: Emergency Medicine

## 2023-12-03 DIAGNOSIS — I5022 Chronic systolic (congestive) heart failure: Secondary | ICD-10-CM

## 2023-12-03 NOTE — Progress Notes (Signed)
 Daily Session Note  Patient Details  Name: ELDWIN VOLKOV MRN: [id number] Date of Birth: 15-May-1950 Referring Provider:   Flowsheet Row Cardiac Rehab from 11/12/2023 in [facility] Cardiac and Pulmonary Rehab  Referring Provider Dr. Ezra Shuck    Encounter Date: 12/03/2023  Check In:  Session Check In - 12/03/23 1540       Check-In   Supervising physician immediately available to respond to emergencies See telemetry face sheet for immediately available ER MD    Location [facility]-Cardiac & Pulmonary Rehab    Staff Present Maxon Conetta BS, Exercise Physiologist;Joseph [facility] RCP,RRT,BSRT;Ahmir Bracken RN,BSN;Noah Tickle, [place], Exercise Physiologist    Virtual Visit No    Medication changes reported     No    Fall or balance concerns reported    No    Tobacco Cessation No Change    Warm-up and Cool-down Performed on first and last piece of equipment    Resistance Training Performed Yes    VAD Patient? No    PAD/SET Patient? No      Pain Assessment   Currently in Pain? No/denies             Social History   Tobacco Use  Smoking Status Former   Current packs/day: 0.00   Average packs/day: 1 pack/day for 32.0 years (32.0 ttl pk-yrs)   Types: Cigarettes   Start date: 03/04/1959   Quit date: 03/04/1991   Years since quitting: 32.7  Smokeless Tobacco Former    Goals Met:  Independence with exercise equipment Exercise tolerated well No report of concerns or symptoms today Strength training completed today  Goals Unmet:  Not Applicable  Comments: Pt able to follow exercise prescription today without complaint.  Will continue to monitor for progression.    Dr. Oneil Pinal is Medical Director for [facility] Cardiac Rehabilitation.  Dr. Fuad Aleskerov is Medical Director for [facility] Pulmonary Rehabilitation.

## 2023-12-07 ENCOUNTER — Encounter: Admitting: Emergency Medicine

## 2023-12-07 DIAGNOSIS — I5022 Chronic systolic (congestive) heart failure: Secondary | ICD-10-CM

## 2023-12-07 NOTE — Progress Notes (Signed)
 Daily Session Note  Patient Details  Name: Ross Riley MRN: [id number] Date of Birth: Jun 08, 1950 Referring Provider:   Flowsheet Row Cardiac Rehab from 11/12/2023 in [facility] Cardiac and Pulmonary Rehab  Referring Provider Dr. Ezra Shuck    Encounter Date: 12/07/2023  Check In:  Session Check In - 12/07/23 1537       Check-In   Supervising physician immediately available to respond to emergencies See telemetry face sheet for immediately available ER MD    Location [facility]-Cardiac & Pulmonary Rehab    Staff Present Leita Franks RN,BSN;Joseph [facility] BS, Exercise Physiologist;Margaret Best, MS, Exercise Physiologist    Virtual Visit No    Medication changes reported     No    Fall or balance concerns reported    No    Tobacco Cessation No Change    Warm-up and Cool-down Performed on first and last piece of equipment    Resistance Training Performed Yes    VAD Patient? No    PAD/SET Patient? No      Pain Assessment   Currently in Pain? No/denies             Social History   Tobacco Use  Smoking Status Former   Current packs/day: 0.00   Average packs/day: 1 pack/day for 32.0 years (32.0 ttl pk-yrs)   Types: Cigarettes   Start date: 03/04/1959   Quit date: 03/04/1991   Years since quitting: 32.7  Smokeless Tobacco Former    Goals Met:  Independence with exercise equipment Exercise tolerated well No report of concerns or symptoms today Strength training completed today  Goals Unmet:  Not Applicable  Comments: Pt able to follow exercise prescription today without complaint.  Will continue to monitor for progression.    Dr. Oneil Pinal is Medical Director for [facility] Cardiac Rehabilitation.  Dr. Fuad Aleskerov is Medical Director for [facility] Pulmonary Rehabilitation.

## 2023-12-09 ENCOUNTER — Encounter: Admitting: Emergency Medicine

## 2023-12-09 DIAGNOSIS — I5022 Chronic systolic (congestive) heart failure: Secondary | ICD-10-CM

## 2023-12-09 NOTE — Progress Notes (Signed)
 Daily Session Note  Patient Details  Name: Ross Riley MRN: [id number] Date of Birth: 1950/07/11 Referring Provider:   Flowsheet Row Cardiac Rehab from 11/12/2023 in [facility] Cardiac and Pulmonary Rehab  Referring Provider Dr. Ezra Shuck    Encounter Date: 12/09/2023  Check In:  Session Check In - 12/09/23 1547       Check-In   Supervising physician immediately available to respond to emergencies See telemetry face sheet for immediately available ER MD    Location [facility]-Cardiac & Pulmonary Rehab    Staff Present Leita Franks RN,BSN;Joseph Rolinda RCP,RRT,BSRT;Kelly Bollinger [facility]    Virtual Visit No    Medication changes reported     No    Fall or balance concerns reported    No    Tobacco Cessation No Change    Warm-up and Cool-down Performed on first and last piece of equipment    Resistance Training Performed Yes    VAD Patient? No    PAD/SET Patient? No      Pain Assessment   Currently in Pain? No/denies             Social History   Tobacco Use  Smoking Status Former   Current packs/day: 0.00   Average packs/day: 1 pack/day for 32.0 years (32.0 ttl pk-yrs)   Types: Cigarettes   Start date: 03/04/1959   Quit date: 03/04/1991   Years since quitting: 32.7  Smokeless Tobacco Former    Goals Met:  Independence with exercise equipment Exercise tolerated well No report of concerns or symptoms today Strength training completed today  Goals Unmet:  Not Applicable  Comments: Pt able to follow exercise prescription today without complaint.  Will continue to monitor for progression.    Dr. Oneil Pinal is Medical Director for [facility] Cardiac Rehabilitation.  Dr. Fuad Aleskerov is Medical Director for [facility] Pulmonary Rehabilitation.

## 2023-12-10 ENCOUNTER — Encounter: Admitting: Emergency Medicine

## 2023-12-10 DIAGNOSIS — I5022 Chronic systolic (congestive) heart failure: Secondary | ICD-10-CM

## 2023-12-10 NOTE — Progress Notes (Signed)
 Daily Session Note  Patient Details  Name: Ross Riley MRN: [id number] Date of Birth: 17-Aug-1950 Referring Provider:   Flowsheet Row Cardiac Rehab from 11/12/2023 in [facility] Cardiac and Pulmonary Rehab  Referring Provider Dr. Ezra Shuck    Encounter Date: 12/10/2023  Check In:  Session Check In - 12/10/23 1542       Check-In   Supervising physician immediately available to respond to emergencies See telemetry face sheet for immediately available ER MD    Location [facility]-Cardiac & Pulmonary Rehab    Staff Present Maxon Conetta BS, Exercise Physiologist;Joseph [facility] RCP,RRT,BSRT;Juanette Urizar RN,BSN;Noah Tickle, [place], Exercise Physiologist    Virtual Visit No    Medication changes reported     No    Fall or balance concerns reported    No    Tobacco Cessation No Change    Warm-up and Cool-down Performed on first and last piece of equipment    Resistance Training Performed Yes    VAD Patient? No    PAD/SET Patient? No      Pain Assessment   Currently in Pain? No/denies             Social History   Tobacco Use  Smoking Status Former   Current packs/day: 0.00   Average packs/day: 1 pack/day for 32.0 years (32.0 ttl pk-yrs)   Types: Cigarettes   Start date: 03/04/1959   Quit date: 03/04/1991   Years since quitting: 32.7  Smokeless Tobacco Former    Goals Met:  Independence with exercise equipment Exercise tolerated well No report of concerns or symptoms today Strength training completed today  Goals Unmet:  Not Applicable  Comments: Pt able to follow exercise prescription today without complaint.  Will continue to monitor for progression.    Dr. Oneil Pinal is Medical Director for [facility] Cardiac Rehabilitation.  Dr. Fuad Aleskerov is Medical Director for [facility] Pulmonary Rehabilitation.

## 2023-12-14 ENCOUNTER — Encounter

## 2023-12-14 ENCOUNTER — Other Ambulatory Visit: Payer: Self-pay | Admitting: Urology

## 2023-12-16 ENCOUNTER — Encounter

## 2023-12-17 ENCOUNTER — Encounter

## 2023-12-21 ENCOUNTER — Encounter

## 2023-12-23 ENCOUNTER — Encounter

## 2023-12-23 DIAGNOSIS — I5022 Chronic systolic (congestive) heart failure: Secondary | ICD-10-CM

## 2023-12-23 NOTE — Progress Notes (Signed)
 Daily Session Note  Patient Details  Name: SAJAN CHEATWOOD MRN: [id number] Date of Birth: 24-Jul-1950 Referring Provider:   Flowsheet Row Cardiac Rehab from 11/12/2023 in [facility] Cardiac and Pulmonary Rehab  Referring Provider Dr. Ezra Shuck    Encounter Date: 12/23/2023  Check In:  Session Check In - 12/23/23 1525       Check-In   Supervising physician immediately available to respond to emergencies See telemetry face sheet for immediately available ER MD    Location [facility]-Cardiac & Pulmonary Rehab    Staff Present Burnard Davenport RN,BSN,MPA;Joseph Rolinda RCP,RRT,BSRT;Laura Cates RN,BSN;Alila Sotero Dyane BS, ACSM CEP, Exercise Physiologist    Virtual Visit No    Medication changes reported     No    Fall or balance concerns reported    No    Tobacco Cessation No Change    Warm-up and Cool-down Performed on first and last piece of equipment    Resistance Training Performed Yes    VAD Patient? No    PAD/SET Patient? No      Pain Assessment   Currently in Pain? No/denies             Social History   Tobacco Use  Smoking Status Former   Current packs/day: 0.00   Average packs/day: 1 pack/day for 32.0 years (32.0 ttl pk-yrs)   Types: Cigarettes   Start date: 03/04/1959   Quit date: 03/04/1991   Years since quitting: 32.8  Smokeless Tobacco Former    Goals Met:  Independence with exercise equipment Exercise tolerated well No report of concerns or symptoms today Strength training completed today  Goals Unmet:  Not Applicable  Comments: Pt able to follow exercise prescription today without complaint.  Will continue to monitor for progression.    Dr. Oneil Pinal is Medical Director for [facility] Cardiac Rehabilitation.  Dr. Fuad Aleskerov is Medical Director for [facility] Pulmonary Rehabilitation.

## 2023-12-23 NOTE — Progress Notes (Signed)
 Cardiac Individual Treatment Plan  Patient Details  Name: Ross Riley MRN: [id number] Date of Birth: 26-Sep-1950 Referring Provider:   Flowsheet Row Cardiac Rehab from 11/12/2023 in [facility] Cardiac and Pulmonary Rehab  Referring Provider Dr. Ezra Shuck    Initial Encounter Date:  Flowsheet Row Cardiac Rehab from 11/12/2023 in [facility] Cardiac and Pulmonary Rehab  Date 11/12/23    Visit Diagnosis: Heart failure, chronic systolic (HCC)  Patient's Home Medications on Admission:  Current Outpatient Medications:    ACCU-CHEK AVIVA PLUS test strip, Use to check blood sugar up to 1 x per day, Disp: 100 each, Rfl: 12   Accu-Chek Softclix Lancets lancets, Use to check blood sugar up to 1 x per day, Disp: 100 each, Rfl: 12   atorvastatin  (LIPITOR ) 80 MG tablet, Take 1 tablet (80 mg total) by mouth at bedtime., Disp: 90 tablet, Rfl: 3   Blood Glucose Monitoring Suppl (ACCU-CHEK AVIVA PLUS) w/Device KIT, Use to check blood sugar up to 1 x per day, Disp: 1 kit, Rfl: 0   carvedilol  (COREG ) 6.25 MG tablet, TAKE 1 TABLET(6.25 MG) BY MOUTH TWICE DAILY WITH A MEAL, Disp: 60 tablet, Rfl: 3   dorzolamide -timolol  (COSOPT ) 22.3-6.8 MG/ML ophthalmic solution, Place 1 drop into both eyes 2 (two) times daily., Disp: , Rfl:    ezetimibe  (ZETIA ) 10 MG tablet, Take 1 tablet (10 mg total) by mouth daily., Disp: 90 tablet, Rfl: 2   finasteride  (PROSCAR ) 5 MG tablet, Take 1 tablet (5 mg total) by mouth daily., Disp: 90 tablet, Rfl: 3   folic acid  (FOLVITE ) 1 MG tablet, Take 1 tablet (1 mg total) by mouth daily., Disp: 30 tablet, Rfl: 2   gabapentin  (NEURONTIN ) 600 MG tablet, Take 1 tablet (600 mg total) by mouth 3 (three) times daily., Disp: 270 tablet, Rfl: 3   isosorbide  mononitrate (IMDUR ) 30 MG 24 hr tablet, TAKE 1/2 TABLET(15 MG) BY MOUTH DAILY, Disp: 45 tablet, Rfl: 3   JARDIANCE  25 MG TABS tablet, Take 25 mg by mouth daily., Disp: , Rfl:    latanoprost  (XALATAN ) 0.005 % ophthalmic solution, Place 1 drop into  both eyes at bedtime., Disp: , Rfl: 6   levothyroxine  (SYNTHROID ) 75 MCG tablet, Take 75 mcg by mouth daily before breakfast., Disp: , Rfl:    Multiple Vitamin (MULTIVITAMIN WITH MINERALS) TABS tablet, Take 1 tablet by mouth daily., Disp: 30 tablet, Rfl: 2   nitroGLYCERIN  (NITROSTAT ) 0.4 MG SL tablet, Place 1 tablet (0.4 mg total) under the tongue every 5 (five) minutes as needed for chest pain., Disp: 25 tablet, Rfl: 2   omeprazole  (PRILOSEC) 40 MG capsule, Take 1 capsule (40 mg total) by mouth daily before breakfast., Disp: 90 capsule, Rfl: 1   sacubitril -valsartan  (ENTRESTO ) 24-26 MG, Take 1 tablet by mouth 2 (two) times daily., Disp: 180 tablet, Rfl: 3   spironolactone  (ALDACTONE ) 25 MG tablet, Take 0.5 tablets (12.5 mg total) by mouth daily., Disp: 90 tablet, Rfl: 3   ticagrelor  (BRILINTA ) 90 MG TABS tablet, TAKE 1 TABLET(90 MG) BY MOUTH TWICE DAILY, Disp: 60 tablet, Rfl: 11  Past Medical History: Past Medical History:  Diagnosis Date   Arthritis    BPH (benign prostatic hyperplasia)    Chronic kidney disease    kidney stones   Dystonia    ED (erectile dysfunction)    GERD (gastroesophageal reflux disease)    Glaucoma (increased eye pressure)    Hematuria    Hemorrhoids    Hyperlipidemia    Hypertension    Stroke (HCC)  Thyroid  disease     Tobacco Use: Social History   Tobacco Use  Smoking Status Former   Current packs/day: 0.00   Average packs/day: 1 pack/day for 32.0 years (32.0 ttl pk-yrs)   Types: Cigarettes   Start date: 03/04/1959   Quit date: 03/04/1991   Years since quitting: 32.8  Smokeless Tobacco Former    Labs: Review Flowsheet  More data exists      Latest Ref Rng & Units 10/02/2022 12/20/2022 04/27/2023 07/01/2023 09/15/2023  Labs for ITP Cardiac and Pulmonary Rehab  Cholestrol 100 - 199 mg/dL - 827  860  882  -  LDL (calc) 0 - 99 mg/dL - 90  69  45  -  HDL-C >39 mg/dL - 49  45  46  -  Trlycerides 0 - 149 mg/dL - 833  823  842  -  Hemoglobin A1c - 7.3      7.0  7.6  - 8.1        Details       This result is from an external source.          Exercise Target Goals: Exercise Program Goal: Individual exercise prescription set using results from initial 6 min walk test and THRR while considering  patient's activity barriers and safety.   Exercise Prescription Goal: Initial exercise prescription builds to 30-45 minutes a day of aerobic activity, 2-3 days per week.  Home exercise guidelines will be given to patient during program as part of exercise prescription that the participant will acknowledge.   Education: Aerobic Exercise: - Group verbal and visual presentation on the components of exercise prescription. Introduces F.I.T.T principle from ACSM for exercise prescriptions.  Reviews F.I.T.T. principles of aerobic exercise including progression. Written material provided at class time.   Education: Resistance Exercise: - Group verbal and visual presentation on the components of exercise prescription. Introduces F.I.T.T principle from ACSM for exercise prescriptions  Reviews F.I.T.T. principles of resistance exercise including progression. Written material provided at class time.    Education: Exercise & Equipment Safety: - Individual verbal instruction and demonstration of equipment use and safety with use of the equipment. Flowsheet Row Cardiac Rehab from 11/12/2023 in [facility] Cardiac and Pulmonary Rehab  Date 11/12/23  Educator MJC  Instruction Review Code 1- Verbalizes Understanding    Education: Exercise Physiology & General Exercise Guidelines: - Group verbal and written instruction with models to review the exercise physiology of the cardiovascular system and associated critical values. Provides general exercise guidelines with specific guidelines to those with heart or lung disease. Written material provided at class time.   Education: Flexibility, Balance, Mind/Body Relaxation: - Group verbal and visual presentation with  interactive activity on the components of exercise prescription. Introduces F.I.T.T principle from ACSM for exercise prescriptions. Reviews F.I.T.T. principles of flexibility and balance exercise training including progression. Also discusses the mind body connection.  Reviews various relaxation techniques to help reduce and manage stress (i.e. Deep breathing, progressive muscle relaxation, and visualization). Balance handout provided to take home. Written material provided at class time.   Activity Barriers & Risk Stratification:  Activity Barriers & Cardiac Risk Stratification - 11/12/23 1512       Activity Barriers & Cardiac Risk Stratification   Activity Barriers Joint Problems;Deconditioning;Shortness of Breath;Other (comment)    Comments Left ankle pains/ mobility issues, shortness of breath.    Cardiac Risk Stratification High          6 Minute Walk:  6 Minute Walk     Row  Name 11/12/23 1510         6 Minute Walk   Phase Initial     Distance 380 feet     Walk Time 5.6 minutes     # of Rest Breaks 2     MPH 0.7     METS 1     RPE 13     Perceived Dyspnea  3     VO2 Peak 1.9     Symptoms Yes (comment)     Comments Dyspnea, left ankle pain, brief spell of dizziness     Resting HR 64 bpm     Resting BP 96/54     Resting Oxygen Saturation  95 %     Exercise Oxygen Saturation  during 6 min walk 97 %     Max Ex. HR 79 bpm     Max Ex. BP 104/54     2 Minute Post BP 92/52        Oxygen Initial Assessment:   Oxygen Re-Evaluation:   Oxygen Discharge (Final Oxygen Re-Evaluation):   Initial Exercise Prescription:  Initial Exercise Prescription - 11/12/23 1500       Date of Initial Exercise RX and Referring Provider   Date 11/12/23    Referring Provider Dr. Ezra Shuck      Oxygen   Maintain Oxygen Saturation 88% or higher      Recumbant Bike   Level 1    RPM 50    Watts 25    Minutes 15    METs 2      NuStep   Level 1    SPM 80    Minutes 15     METs 2      T5 Nustep   Level 1    SPM 80    Minutes 15    METs 2      Biostep-RELP   Level 1    SPM 50    Minutes 15    METs 2      Track   Laps 10    Minutes 15    METs 1.54      Prescription Details   Duration Progress to 30 minutes of continuous aerobic without signs/symptoms of physical distress      Intensity   THRR 40-80% of Max Heartrate 97-131    Ratings of Perceived Exertion 11-13    Perceived Dyspnea 0-4      Progression   Progression Continue to progress workloads to maintain intensity without signs/symptoms of physical distress.      Resistance Training   Training Prescription Yes    Weight 4lb    Reps 10-15          Perform Capillary Blood Glucose checks as needed.  Exercise Prescription Changes:   Exercise Prescription Changes     Row Name 11/12/23 1500 11/30/23 1100 12/14/23 1100         Response to Exercise   Blood Pressure (Admit) 96/54 112/60 96/56     Blood Pressure (Exercise) 104/54 126/58 120/66     Blood Pressure (Exit) 92/52 98/60 90/56      Heart Rate (Admit) 64 bpm 61 bpm 59 bpm     Heart Rate (Exercise) 79 bpm 89 bpm 96 bpm     Heart Rate (Exit) 65 bpm 65 bpm 66 bpm     Oxygen Saturation (Admit) 95 % -- --     Oxygen Saturation (Exercise) 97 % -- --     Oxygen Saturation (Exit) 97 % -- --  Rating of Perceived Exertion (Exercise) 13 13 12      Perceived Dyspnea (Exercise) 3 0 0     Symptoms left ankle pain, brief dizziness, SOB none none     Comments results first 2 weeks of exercise --     Duration -- Progress to 30 minutes of  aerobic without signs/symptoms of physical distress Progress to 30 minutes of  aerobic without signs/symptoms of physical distress     Intensity -- THRR unchanged THRR unchanged       Progression   Progression -- Continue to progress workloads to maintain intensity without signs/symptoms of physical distress. Continue to progress workloads to maintain intensity without signs/symptoms of  physical distress.     Average METs -- 2.3 2.5       Resistance Training   Training Prescription -- Yes Yes     Weight -- 4lb 4lb     Reps -- 10-15 10-15       Interval Training   Interval Training -- No No       Recumbant Bike   Level -- 3 5     Watts -- 22 29     Minutes -- 15 15     METs -- 3.05 3.22       NuStep   Level -- 4 4     Minutes -- 15 15     METs -- 3.2 2.5       T5 Nustep   Level -- 3 4     Minutes -- 15 15     METs -- 2 2       Biostep-RELP   Level -- 1 3     Minutes -- 15 15     METs -- 2 3.1       Track   Laps -- 7 8     Minutes -- 15 15     METs -- 1.38 1.44       Oxygen   Maintain Oxygen Saturation -- 88% or higher 88% or higher        Exercise Comments:   Exercise Comments     Row Name 11/16/23 1559           Exercise Comments First full day of exercise!  Patient was oriented to gym and equipment including functions, settings, policies, and procedures.  Patient's individual exercise prescription and treatment plan were reviewed.  All starting workloads were established based on the results of the 6 minute walk test done at initial orientation visit.  The plan for exercise progression was also introduced and progression will be customized based on patient's performance and goals.          Exercise Goals and Review:   Exercise Goals     Row Name 11/12/23 1516             Exercise Goals   Increase Physical Activity Yes       Intervention Provide advice, education, support and counseling about physical activity/exercise needs.;Develop an individualized exercise prescription for aerobic and resistive training based on initial evaluation findings, risk stratification, comorbidities and participant's personal goals.       Expected Outcomes Short Term: Attend rehab on a regular basis to increase amount of physical activity.;Long Term: Add in home exercise to make exercise part of routine and to increase amount of physical activity.;Long  Term: Exercising regularly at least 3-5 days a week.       Increase Strength and Stamina Yes  Intervention Provide advice, education, support and counseling about physical activity/exercise needs.;Develop an individualized exercise prescription for aerobic and resistive training based on initial evaluation findings, risk stratification, comorbidities and participant's personal goals.       Expected Outcomes Short Term: Increase workloads from initial exercise prescription for resistance, speed, and METs.;Short Term: Perform resistance training exercises routinely during rehab and add in resistance training at home;Long Term: Improve cardiorespiratory fitness, muscular endurance and strength as measured by increased METs and functional capacity ( )       Able to understand and use rate of perceived exertion (RPE) scale Yes       Intervention Provide education and explanation on how to use RPE scale       Expected Outcomes Short Term: Able to use RPE daily in rehab to express subjective intensity level;Long Term:  Able to use RPE to guide intensity level when exercising independently       Able to understand and use Dyspnea scale Yes       Intervention Provide education and explanation on how to use Dyspnea scale       Expected Outcomes Short Term: Able to use Dyspnea scale daily in rehab to express subjective sense of shortness of breath during exertion;Long Term: Able to use Dyspnea scale to guide intensity level when exercising independently       Knowledge and understanding of Target Heart Rate Range (THRR) Yes       Intervention Provide education and explanation of THRR including how the numbers were predicted and where they are located for reference       Expected Outcomes Short Term: Able to state/look up THRR;Long Term: Able to use THRR to govern intensity when exercising independently       Able to check pulse independently Yes       Intervention Provide education and demonstration on  how to check pulse in carotid and radial arteries.;Review the importance of being able to check your own pulse for safety during independent exercise       Expected Outcomes Short Term: Able to explain why pulse checking is important during independent exercise;Long Term: Able to check pulse independently and accurately       Understanding of Exercise Prescription Yes       Intervention Provide education, explanation, and written materials on patient's individual exercise prescription       Expected Outcomes Short Term: Able to explain program exercise prescription;Long Term: Able to explain home exercise prescription to exercise independently          Exercise Goals Re-Evaluation :  Exercise Goals Re-Evaluation     Row Name 11/16/23 1600 11/30/23 1152 12/14/23 1147         Exercise Goal Re-Evaluation   Exercise Goals Review Increase Physical Activity;Able to understand and use rate of perceived exertion (RPE) scale;Knowledge and understanding of Target Heart Rate Range (THRR);Understanding of Exercise Prescription;Able to understand and use Dyspnea scale;Increase Strength and Stamina;Able to check pulse independently Increase Physical Activity;Increase Strength and Stamina;Understanding of Exercise Prescription Increase Physical Activity;Increase Strength and Stamina;Understanding of Exercise Prescription     Comments Reviewed RPE and dyspnea scale, THR and program prescription with pt today.  Pt voiced understanding and was given a copy of goals to take home. Bryden is off to a good start in the program, and was able to attend his first few sessions during this review period. During his sessions he was able to use the T5 nustep and recumbent bike at level 3. We  will continue to monitor his progress in the program. Kc is doing well in rehab. He was recently able to increase from level 3 to 4 on the T4 nustep, and increase from level 3 to 5 on the recumbent bike. We will continue to monitor his  progress in the program.     Expected Outcomes Short: Use RPE daily to regulate intensity.  Long: Follow program prescription in THR. Short: Continue to follow exercise prescription. Long: Continue exercise to improve strength and stamina. Short: Continue to increase Nustep workload. Long: Continue exercise to improve strength and stamina.        Discharge Exercise Prescription (Final Exercise Prescription Changes):  Exercise Prescription Changes - 12/14/23 1100       Response to Exercise   Blood Pressure (Admit) 96/56    Blood Pressure (Exercise) 120/66    Blood Pressure (Exit) 90/56    Heart Rate (Admit) 59 bpm    Heart Rate (Exercise) 96 bpm    Heart Rate (Exit) 66 bpm    Rating of Perceived Exertion (Exercise) 12    Perceived Dyspnea (Exercise) 0    Symptoms none    Duration Progress to 30 minutes of  aerobic without signs/symptoms of physical distress    Intensity THRR unchanged      Progression   Progression Continue to progress workloads to maintain intensity without signs/symptoms of physical distress.    Average METs 2.5      Resistance Training   Training Prescription Yes    Weight 4lb    Reps 10-15      Interval Training   Interval Training No      Recumbant Bike   Level 5    Watts 29    Minutes 15    METs 3.22      NuStep   Level 4    Minutes 15    METs 2.5      T5 Nustep   Level 4    Minutes 15    METs 2      Biostep-RELP   Level 3    Minutes 15    METs 3.1      Track   Laps 8    Minutes 15    METs 1.44      Oxygen   Maintain Oxygen Saturation 88% or higher          Nutrition:  Target Goals: Understanding of nutrition guidelines, daily intake of sodium 1500mg , cholesterol 200mg , calories 30% from fat and 7% or less from saturated fats, daily to have 5 or more servings of fruits and vegetables.  Education: Nutrition 1 -Group instruction provided by verbal, written material, interactive activities, discussions, models, and posters to  present general guidelines for heart healthy nutrition including macronutrients, label reading, and promoting whole foods over processed counterparts. Education serves as Pensions consultant of discussion of heart healthy eating for all. Written material provided at class time.    Education: Nutrition 2 -Group instruction provided by verbal, written material, interactive activities, discussions, models, and posters to present general guidelines for heart healthy nutrition including sodium, cholesterol, and saturated fat. Providing guidance of habit forming to improve blood pressure, cholesterol, and body weight. Written material provided at class time.     Biometrics:  Pre Biometrics - 11/12/23 1516       Pre Biometrics   Height 5' 1 (1.549 m)    Weight 163 lb (73.9 kg)    Waist Circumference 37.5 inches    Hip Circumference 39 inches  Waist to Hip Ratio 0.96 %    BMI (Calculated) 30.81    Single Leg Stand 11.41 seconds           Nutrition Therapy Plan and Nutrition Goals:   Nutrition Assessments:  MEDIFICTS Score Key: >=70 Need to make dietary changes  40-70 Heart Healthy Diet <= 40 Therapeutic Level Cholesterol Diet   Picture Your Plate Scores: <59 Unhealthy dietary pattern with much room for improvement. 41-50 Dietary pattern unlikely to meet recommendations for good health and room for improvement. 51-60 More healthful dietary pattern, with some room for improvement.  >60 Healthy dietary pattern, although there may be some specific behaviors that could be improved.    Nutrition Goals Re-Evaluation:   Nutrition Goals Discharge (Final Nutrition Goals Re-Evaluation):   Psychosocial: Target Goals: Acknowledge presence or absence of significant depression and/or stress, maximize coping skills, provide positive support system. Participant is able to verbalize types and ability to use techniques and skills needed for reducing stress and depression.   Education: Stress,  Anxiety, and Depression - Group verbal and visual presentation to define topics covered.  Reviews how body is impacted by stress, anxiety, and depression.  Also discusses healthy ways to reduce stress and to treat/manage anxiety and depression. Written material provided at class time.   Education: Sleep Hygiene -Provides group verbal and written instruction about how sleep can affect your health.  Define sleep hygiene, discuss sleep cycles and impact of sleep habits. Review good sleep hygiene tips.   Initial Review & Psychosocial Screening:  Initial Psych Review & Screening - 11/12/23 1357       Initial Review   Current issues with Current Stress Concerns    Source of Stress Concerns Chronic Illness      Family Dynamics   Good Support System? Yes      Barriers   Psychosocial barriers to participate in program There are no identifiable barriers or psychosocial needs.;The patient should benefit from training in stress management and relaxation.      Screening Interventions   Interventions Encouraged to exercise;Provide feedback about the scores to participant;To provide support and resources with identified psychosocial needs    Expected Outcomes Short Term goal: Utilizing psychosocial counselor, staff and physician to assist with identification of specific Stressors or current issues interfering with healing process. Setting desired goal for each stressor or current issue identified.;Long Term Goal: Stressors or current issues are controlled or eliminated.;Short Term goal: Identification and review with participant of any Quality of Life or Depression concerns found by scoring the questionnaire.;Long Term goal: The participant improves quality of Life and PHQ9 Scores as seen by post scores and/or verbalization of changes          Quality of Life Scores:   Quality of Life - 11/12/23 1523       Quality of Life   Select Quality of Life      Quality of Life Scores   Health/Function  Pre 6.1 %    Socioeconomic Pre 20.06 %    Psych/Spiritual Pre 18.43 %    Family Pre 20.4 %    GLOBAL Pre 13.8 %         Scores of 19 and below usually indicate a poorer quality of life in these areas.  A difference of  2-3 points is a clinically meaningful difference.  A difference of 2-3 points in the total score of the Quality of Life Index has been associated with significant improvement in overall quality of life, self-image, physical  symptoms, and general health in studies assessing change in quality of life.  PHQ-9: Review Flowsheet  More data exists      11/12/2023 08/13/2023 12/26/2022 10/24/2022 08/07/2022  Depression screen PHQ 2/9  Decreased Interest 1 0 0 0 1  Down, Depressed, Hopeless 1 0 0 1 1  PHQ - 2 Score 2 0 0 1 2  Altered sleeping 0 - 0 0 1  Tired, decreased energy 2 - 0 1 1  Change in appetite 0 - 0 0 0  Feeling bad or failure about yourself  0 - 0 0 0  Trouble concentrating 0 - 0 0 0  Moving slowly or fidgety/restless 1 - 0 0 0  Suicidal thoughts 1 - 0 3 0  PHQ-9 Score 6 - 0 5 4  Difficult doing work/chores Not difficult at all - Not difficult at all Somewhat difficult Not difficult at all   Interpretation of Total Score  Total Score Depression Severity:  1-4 = Minimal depression, 5-9 = Mild depression, 10-14 = Moderate depression, 15-19 = Moderately severe depression, 20-27 = Severe depression   Psychosocial Evaluation and Intervention:  Psychosocial Evaluation - 11/12/23 1357       Psychosocial Evaluation & Interventions   Interventions Encouraged to exercise with the program and follow exercise prescription    Comments Mr. Nagy is coming to cardiac rehab with systolic HF. He is accompanied by a family member who helps translate. When asked about stress, he mentioned some days he feels more stressed than others. His stress stems from his shortness of breath and disease process with heart failure. He reports good sleep habits.    Expected Outcomes Short:  attend cardiac rehab for education and exercise Long: develop and maintain positive self care habits    Continue Psychosocial Services  Follow up required by staff          Psychosocial Re-Evaluation:   Psychosocial Discharge (Final Psychosocial Re-Evaluation):   Vocational Rehabilitation: Provide vocational rehab assistance to qualifying candidates.   Vocational Rehab Evaluation & Intervention:  Vocational Rehab - 11/12/23 1357       Initial Vocational Rehab Evaluation & Intervention   Assessment shows need for Vocational Rehabilitation No          Education: Education Goals: Education classes will be provided on a variety of topics geared toward better understanding of heart health and risk factor modification. Participant will state understanding/return demonstration of topics presented as noted by education test scores.  Learning Barriers/Preferences:  Learning Barriers/Preferences - 11/12/23 1357       Learning Barriers/Preferences   Learning Barriers Language    Learning Preferences Individual Instruction          General Cardiac Education Topics:  AED/CPR: - Group verbal and written instruction with the use of models to demonstrate the basic use of the AED with the basic ABC's of resuscitation.   Test and Procedures: - Group verbal and visual presentation and models provide information about basic cardiac anatomy and function. Reviews the testing methods done to diagnose heart disease and the outcomes of the test results. Describes the treatment choices: Medical Management, Angioplasty, or Coronary Bypass Surgery for treating various heart conditions including Myocardial Infarction, Angina, Valve Disease, and Cardiac Arrhythmias. Written material provided at class time.   Medication Safety: - Group verbal and visual instruction to review commonly prescribed medications for heart and lung disease. Reviews the medication, class of the drug, and side effects.  Includes the steps to properly store meds and maintain  the prescription regimen. Written material provided at class time.   Intimacy: - Group verbal instruction through game format to discuss how heart and lung disease can affect sexual intimacy. Written material provided at class time.   Know Your Numbers and Heart Failure: - Group verbal and visual instruction to discuss disease risk factors for cardiac and pulmonary disease and treatment options.  Reviews associated critical values for Overweight/Obesity, Hypertension, Cholesterol, and Diabetes.  Discusses basics of heart failure: signs/symptoms and treatments.  Introduces Heart Failure Zone chart for action plan for heart failure. Written material provided at class time.   Infection Prevention: - Provides verbal and written material to individual with discussion of infection control including proper hand washing and proper equipment cleaning during exercise session. Flowsheet Row Cardiac Rehab from 11/12/2023 in [facility] Cardiac and Pulmonary Rehab  Date 11/12/23  Educator MJC  Instruction Review Code 1- Verbalizes Understanding    Falls Prevention: - Provides verbal and written material to individual with discussion of falls prevention and safety. Flowsheet Row Cardiac Rehab from 11/12/2023 in [facility] Cardiac and Pulmonary Rehab  Date 11/12/23  Educator MJC  Instruction Review Code 1- Verbalizes Understanding    Other: -Provides group and verbal instruction on various topics (see comments)   Knowledge Questionnaire Score:   Core Components/Risk Factors/Patient Goals at Admission:  Personal Goals and Risk Factors at Admission - 11/12/23 1356       Core Components/Risk Factors/Patient Goals on Admission    Weight Management Weight Maintenance;Yes    Intervention Weight Management: Develop a combined nutrition and exercise program designed to reach desired caloric intake, while maintaining appropriate intake of nutrient and fiber,  sodium and fats, and appropriate energy expenditure required for the weight goal.;Weight Management: Provide education and appropriate resources to help participant work on and attain dietary goals.    Expected Outcomes Short Term: Continue to assess and modify interventions until short term weight is achieved;Weight Maintenance: Understanding of the daily nutrition guidelines, which includes 25-35% calories from fat, 7% or less cal from saturated fats, less than 200mg  cholesterol, less than 1.5gm of sodium, & 5 or more servings of fruits and vegetables daily;Long Term: Adherence to nutrition and physical activity/exercise program aimed toward attainment of established weight goal;Understanding recommendations for meals to include 15-35% energy as protein, 25-35% energy from fat, 35-60% energy from carbohydrates, less than 200mg  of dietary cholesterol, 20-35 gm of total fiber daily;Understanding of distribution of calorie intake throughout the day with the consumption of 4-5 meals/snacks    Diabetes Yes    Intervention Provide education about signs/symptoms and action to take for hypo/hyperglycemia.;Provide education about proper nutrition, including hydration, and aerobic/resistive exercise prescription along with prescribed medications to achieve blood glucose in normal ranges: Fasting glucose 65-99 mg/dL    Expected Outcomes Short Term: Participant verbalizes understanding of the signs/symptoms and immediate care of hyper/hypoglycemia, proper foot care and importance of medication, aerobic/resistive exercise and nutrition plan for blood glucose control.;Long Term: Attainment of HbA1C < 7%.    Heart Failure Yes    Intervention Provide a combined exercise and nutrition program that is supplemented with education, support and counseling about heart failure. Directed toward relieving symptoms such as shortness of breath, decreased exercise tolerance, and extremity edema.    Expected Outcomes Improve functional  capacity of life;Short term: Attendance in program 2-3 days a week with increased exercise capacity. Reported lower sodium intake. Reported increased fruit and vegetable intake. Reports medication compliance.;Short term: Daily weights obtained and reported for increase. Utilizing diuretic  protocols set by physician.;Long term: Adoption of self-care skills and reduction of barriers for early signs and symptoms recognition and intervention leading to self-care maintenance.    Lipids Yes    Intervention Provide education and support for participant on nutrition & aerobic/resistive exercise along with prescribed medications to achieve LDL 70mg , HDL >40mg .    Expected Outcomes Short Term: Participant states understanding of desired cholesterol values and is compliant with medications prescribed. Participant is following exercise prescription and nutrition guidelines.;Long Term: Cholesterol controlled with medications as prescribed, with individualized exercise RX and with personalized nutrition plan. Value goals: LDL < 70mg , HDL > 40 mg.          Education:Diabetes - Individual verbal and written instruction to review signs/symptoms of diabetes, desired ranges of glucose level fasting, after meals and with exercise. Acknowledge that pre and post exercise glucose checks will be done for 3 sessions at entry of program. Flowsheet Row Cardiac Rehab from 11/12/2023 in [facility] Cardiac and Pulmonary Rehab  Date 11/12/23  Educator MJC  Instruction Review Code 1- Verbalizes Understanding    Core Components/Risk Factors/Patient Goals Review:    Core Components/Risk Factors/Patient Goals at Discharge (Final Review):    ITP Comments:  ITP Comments     Row Name 11/12/23 1350 11/12/23 1509 11/16/23 1559 11/25/23 0953 12/23/23 0820   ITP Comments Initial orientation completed. Diagnosis can be found in [facility] 8/12. EP orientation today at 2pm. Completed and gym orientation for cardiac rehab. Initial ITP  created and sent for review to Dr. Fuad Aleskerov, Medical Director. First full day of exercise!  Patient was oriented to gym and equipment including functions, settings, policies, and procedures.  Patient's individual exercise prescription and treatment plan were reviewed.  All starting workloads were established based on the results of the 6 minute walk test done at initial orientation visit.  The plan for exercise progression was also introduced and progression will be customized based on patient's performance and goals. 30 Day review completed. Medical Director ITP review done, changes made as directed, and signed approval by Medical Director. New to Program. 30 Day review completed. Medical Director ITP review done, changes made as directed, and signed approval by Medical Director.      Comments 30 day review

## 2023-12-24 ENCOUNTER — Encounter: Admitting: Emergency Medicine

## 2023-12-24 ENCOUNTER — Ambulatory Visit: Payer: Self-pay

## 2023-12-24 VITALS — Ht 61.0 in | Wt 164.8 lb

## 2023-12-24 DIAGNOSIS — I5022 Chronic systolic (congestive) heart failure: Secondary | ICD-10-CM

## 2023-12-24 NOTE — Progress Notes (Signed)
 Cardiac Individual Treatment Plan  Patient Details  Name: Ross Riley MRN: [id number] Date of Birth: Dec 31, 1950 Referring Provider:   Flowsheet Row Cardiac Rehab from 11/12/2023 in [facility] Cardiac and Pulmonary Rehab  Referring Provider Dr. Ezra Shuck    Initial Encounter Date:  Flowsheet Row Cardiac Rehab from 11/12/2023 in [facility] Cardiac and Pulmonary Rehab  Date 11/12/23    Visit Diagnosis: Heart failure, chronic systolic (HCC)  Patient's Home Medications on Admission:  Current Outpatient Medications:    ACCU-CHEK AVIVA PLUS test strip, Use to check blood sugar up to 1 x per day, Disp: 100 each, Rfl: 12   Accu-Chek Softclix Lancets lancets, Use to check blood sugar up to 1 x per day, Disp: 100 each, Rfl: 12   atorvastatin  (LIPITOR ) 80 MG tablet, Take 1 tablet (80 mg total) by mouth at bedtime., Disp: 90 tablet, Rfl: 3   Blood Glucose Monitoring Suppl (ACCU-CHEK AVIVA PLUS) w/Device KIT, Use to check blood sugar up to 1 x per day, Disp: 1 kit, Rfl: 0   carvedilol  (COREG ) 6.25 MG tablet, TAKE 1 TABLET(6.25 MG) BY MOUTH TWICE DAILY WITH A MEAL, Disp: 60 tablet, Rfl: 3   dorzolamide -timolol  (COSOPT ) 22.3-6.8 MG/ML ophthalmic solution, Place 1 drop into both eyes 2 (two) times daily., Disp: , Rfl:    ezetimibe  (ZETIA ) 10 MG tablet, Take 1 tablet (10 mg total) by mouth daily., Disp: 90 tablet, Rfl: 2   finasteride  (PROSCAR ) 5 MG tablet, Take 1 tablet (5 mg total) by mouth daily., Disp: 90 tablet, Rfl: 3   folic acid  (FOLVITE ) 1 MG tablet, Take 1 tablet (1 mg total) by mouth daily., Disp: 30 tablet, Rfl: 2   gabapentin  (NEURONTIN ) 600 MG tablet, Take 1 tablet (600 mg total) by mouth 3 (three) times daily., Disp: 270 tablet, Rfl: 3   isosorbide  mononitrate (IMDUR ) 30 MG 24 hr tablet, TAKE 1/2 TABLET(15 MG) BY MOUTH DAILY, Disp: 45 tablet, Rfl: 3   JARDIANCE  25 MG TABS tablet, Take 25 mg by mouth daily., Disp: , Rfl:    latanoprost  (XALATAN ) 0.005 % ophthalmic solution, Place 1 drop into  both eyes at bedtime., Disp: , Rfl: 6   levothyroxine  (SYNTHROID ) 75 MCG tablet, Take 75 mcg by mouth daily before breakfast., Disp: , Rfl:    Multiple Vitamin (MULTIVITAMIN WITH MINERALS) TABS tablet, Take 1 tablet by mouth daily., Disp: 30 tablet, Rfl: 2   nitroGLYCERIN  (NITROSTAT ) 0.4 MG SL tablet, Place 1 tablet (0.4 mg total) under the tongue every 5 (five) minutes as needed for chest pain., Disp: 25 tablet, Rfl: 2   omeprazole  (PRILOSEC) 40 MG capsule, Take 1 capsule (40 mg total) by mouth daily before breakfast., Disp: 90 capsule, Rfl: 1   sacubitril -valsartan  (ENTRESTO ) 24-26 MG, Take 1 tablet by mouth 2 (two) times daily., Disp: 180 tablet, Rfl: 3   spironolactone  (ALDACTONE ) 25 MG tablet, Take 0.5 tablets (12.5 mg total) by mouth daily., Disp: 90 tablet, Rfl: 3   ticagrelor  (BRILINTA ) 90 MG TABS tablet, TAKE 1 TABLET(90 MG) BY MOUTH TWICE DAILY, Disp: 60 tablet, Rfl: 11  Past Medical History: Past Medical History:  Diagnosis Date   Arthritis    BPH (benign prostatic hyperplasia)    Chronic kidney disease    kidney stones   Dystonia    ED (erectile dysfunction)    GERD (gastroesophageal reflux disease)    Glaucoma (increased eye pressure)    Hematuria    Hemorrhoids    Hyperlipidemia    Hypertension    Stroke (HCC)  Thyroid  disease     Tobacco Use: Social History   Tobacco Use  Smoking Status Former   Current packs/day: 0.00   Average packs/day: 1 pack/day for 32.0 years (32.0 ttl pk-yrs)   Types: Cigarettes   Start date: 03/04/1959   Quit date: 03/04/1991   Years since quitting: 32.8  Smokeless Tobacco Former    Labs: Review Flowsheet  More data exists      Latest Ref Rng & Units 10/02/2022 12/20/2022 04/27/2023 07/01/2023 09/15/2023  Labs for ITP Cardiac and Pulmonary Rehab  Cholestrol 100 - 199 mg/dL - 827  860  882  -  LDL (calc) 0 - 99 mg/dL - 90  69  45  -  HDL-C >39 mg/dL - 49  45  46  -  Trlycerides 0 - 149 mg/dL - 833  823  842  -  Hemoglobin A1c - 7.3      7.0  7.6  - 8.1        Details       This result is from an external source.          Exercise Target Goals: Exercise Program Goal: Individual exercise prescription set using results from initial 6 min walk test and THRR while considering  patient's activity barriers and safety.   Exercise Prescription Goal: Initial exercise prescription builds to 30-45 minutes a day of aerobic activity, 2-3 days per week.  Home exercise guidelines will be given to patient during program as part of exercise prescription that the participant will acknowledge.   Education: Aerobic Exercise: - Group verbal and visual presentation on the components of exercise prescription. Introduces F.I.T.T principle from ACSM for exercise prescriptions.  Reviews F.I.T.T. principles of aerobic exercise including progression. Written material provided at class time.   Education: Resistance Exercise: - Group verbal and visual presentation on the components of exercise prescription. Introduces F.I.T.T principle from ACSM for exercise prescriptions  Reviews F.I.T.T. principles of resistance exercise including progression. Written material provided at class time.    Education: Exercise & Equipment Safety: - Individual verbal instruction and demonstration of equipment use and safety with use of the equipment. Flowsheet Row Cardiac Rehab from 11/12/2023 in [facility] Cardiac and Pulmonary Rehab  Date 11/12/23  Educator MJC  Instruction Review Code 1- Verbalizes Understanding    Education: Exercise Physiology & General Exercise Guidelines: - Group verbal and written instruction with models to review the exercise physiology of the cardiovascular system and associated critical values. Provides general exercise guidelines with specific guidelines to those with heart or lung disease. Written material provided at class time.   Education: Flexibility, Balance, Mind/Body Relaxation: - Group verbal and visual presentation with  interactive activity on the components of exercise prescription. Introduces F.I.T.T principle from ACSM for exercise prescriptions. Reviews F.I.T.T. principles of flexibility and balance exercise training including progression. Also discusses the mind body connection.  Reviews various relaxation techniques to help reduce and manage stress (i.e. Deep breathing, progressive muscle relaxation, and visualization). Balance handout provided to take home. Written material provided at class time.   Activity Barriers & Risk Stratification:  Activity Barriers & Cardiac Risk Stratification - 11/12/23 1512       Activity Barriers & Cardiac Risk Stratification   Activity Barriers Joint Problems;Deconditioning;Shortness of Breath;Other (comment)    Comments Left ankle pains/ mobility issues, shortness of breath.    Cardiac Risk Stratification High          6 Minute Walk:  6 Minute Walk     Row  Name 11/12/23 1510         6 Minute Walk   Phase Initial     Distance 380 feet     Walk Time 5.6 minutes     # of Rest Breaks 2     MPH 0.7     METS 1     RPE 13     Perceived Dyspnea  3     VO2 Peak 1.9     Symptoms Yes (comment)     Comments Dyspnea, left ankle pain, brief spell of dizziness     Resting HR 64 bpm     Resting BP 96/54     Resting Oxygen Saturation  95 %     Exercise Oxygen Saturation  during 6 min walk 97 %     Max Ex. HR 79 bpm     Max Ex. BP 104/54     2 Minute Post BP 92/52        Oxygen Initial Assessment:   Oxygen Re-Evaluation:   Oxygen Discharge (Final Oxygen Re-Evaluation):   Initial Exercise Prescription:  Initial Exercise Prescription - 11/12/23 1500       Date of Initial Exercise RX and Referring Provider   Date 11/12/23    Referring Provider Dr. Ezra Shuck      Oxygen   Maintain Oxygen Saturation 88% or higher      Recumbant Bike   Level 1    RPM 50    Watts 25    Minutes 15    METs 2      NuStep   Level 1    SPM 80    Minutes 15     METs 2      T5 Nustep   Level 1    SPM 80    Minutes 15    METs 2      Biostep-RELP   Level 1    SPM 50    Minutes 15    METs 2      Track   Laps 10    Minutes 15    METs 1.54      Prescription Details   Duration Progress to 30 minutes of continuous aerobic without signs/symptoms of physical distress      Intensity   THRR 40-80% of Max Heartrate 97-131    Ratings of Perceived Exertion 11-13    Perceived Dyspnea 0-4      Progression   Progression Continue to progress workloads to maintain intensity without signs/symptoms of physical distress.      Resistance Training   Training Prescription Yes    Weight 4lb    Reps 10-15          Perform Capillary Blood Glucose checks as needed.  Exercise Prescription Changes:   Exercise Prescription Changes     Row Name 11/12/23 1500 11/30/23 1100 12/14/23 1100         Response to Exercise   Blood Pressure (Admit) 96/54 112/60 96/56     Blood Pressure (Exercise) 104/54 126/58 120/66     Blood Pressure (Exit) 92/52 98/60 90/56      Heart Rate (Admit) 64 bpm 61 bpm 59 bpm     Heart Rate (Exercise) 79 bpm 89 bpm 96 bpm     Heart Rate (Exit) 65 bpm 65 bpm 66 bpm     Oxygen Saturation (Admit) 95 % -- --     Oxygen Saturation (Exercise) 97 % -- --     Oxygen Saturation (Exit) 97 % -- --  Rating of Perceived Exertion (Exercise) 13 13 12      Perceived Dyspnea (Exercise) 3 0 0     Symptoms left ankle pain, brief dizziness, SOB none none     Comments results first 2 weeks of exercise --     Duration -- Progress to 30 minutes of  aerobic without signs/symptoms of physical distress Progress to 30 minutes of  aerobic without signs/symptoms of physical distress     Intensity -- THRR unchanged THRR unchanged       Progression   Progression -- Continue to progress workloads to maintain intensity without signs/symptoms of physical distress. Continue to progress workloads to maintain intensity without signs/symptoms of  physical distress.     Average METs -- 2.3 2.5       Resistance Training   Training Prescription -- Yes Yes     Weight -- 4lb 4lb     Reps -- 10-15 10-15       Interval Training   Interval Training -- No No       Recumbant Bike   Level -- 3 5     Watts -- 22 29     Minutes -- 15 15     METs -- 3.05 3.22       NuStep   Level -- 4 4     Minutes -- 15 15     METs -- 3.2 2.5       T5 Nustep   Level -- 3 4     Minutes -- 15 15     METs -- 2 2       Biostep-RELP   Level -- 1 3     Minutes -- 15 15     METs -- 2 3.1       Track   Laps -- 7 8     Minutes -- 15 15     METs -- 1.38 1.44       Oxygen   Maintain Oxygen Saturation -- 88% or higher 88% or higher        Exercise Comments:   Exercise Comments     Row Name 11/16/23 1559           Exercise Comments First full day of exercise!  Patient was oriented to gym and equipment including functions, settings, policies, and procedures.  Patient's individual exercise prescription and treatment plan were reviewed.  All starting workloads were established based on the results of the 6 minute walk test done at initial orientation visit.  The plan for exercise progression was also introduced and progression will be customized based on patient's performance and goals.          Exercise Goals and Review:   Exercise Goals     Row Name 11/12/23 1516             Exercise Goals   Increase Physical Activity Yes       Intervention Provide advice, education, support and counseling about physical activity/exercise needs.;Develop an individualized exercise prescription for aerobic and resistive training based on initial evaluation findings, risk stratification, comorbidities and participant's personal goals.       Expected Outcomes Short Term: Attend rehab on a regular basis to increase amount of physical activity.;Long Term: Add in home exercise to make exercise part of routine and to increase amount of physical activity.;Long  Term: Exercising regularly at least 3-5 days a week.       Increase Strength and Stamina Yes  Intervention Provide advice, education, support and counseling about physical activity/exercise needs.;Develop an individualized exercise prescription for aerobic and resistive training based on initial evaluation findings, risk stratification, comorbidities and participant's personal goals.       Expected Outcomes Short Term: Increase workloads from initial exercise prescription for resistance, speed, and METs.;Short Term: Perform resistance training exercises routinely during rehab and add in resistance training at home;Long Term: Improve cardiorespiratory fitness, muscular endurance and strength as measured by increased METs and functional capacity ( )       Able to understand and use rate of perceived exertion (RPE) scale Yes       Intervention Provide education and explanation on how to use RPE scale       Expected Outcomes Short Term: Able to use RPE daily in rehab to express subjective intensity level;Long Term:  Able to use RPE to guide intensity level when exercising independently       Able to understand and use Dyspnea scale Yes       Intervention Provide education and explanation on how to use Dyspnea scale       Expected Outcomes Short Term: Able to use Dyspnea scale daily in rehab to express subjective sense of shortness of breath during exertion;Long Term: Able to use Dyspnea scale to guide intensity level when exercising independently       Knowledge and understanding of Target Heart Rate Range (THRR) Yes       Intervention Provide education and explanation of THRR including how the numbers were predicted and where they are located for reference       Expected Outcomes Short Term: Able to state/look up THRR;Long Term: Able to use THRR to govern intensity when exercising independently       Able to check pulse independently Yes       Intervention Provide education and demonstration on  how to check pulse in carotid and radial arteries.;Review the importance of being able to check your own pulse for safety during independent exercise       Expected Outcomes Short Term: Able to explain why pulse checking is important during independent exercise;Long Term: Able to check pulse independently and accurately       Understanding of Exercise Prescription Yes       Intervention Provide education, explanation, and written materials on patient's individual exercise prescription       Expected Outcomes Short Term: Able to explain program exercise prescription;Long Term: Able to explain home exercise prescription to exercise independently          Exercise Goals Re-Evaluation :  Exercise Goals Re-Evaluation     Row Name 11/16/23 1600 11/30/23 1152 12/14/23 1147         Exercise Goal Re-Evaluation   Exercise Goals Review Increase Physical Activity;Able to understand and use rate of perceived exertion (RPE) scale;Knowledge and understanding of Target Heart Rate Range (THRR);Understanding of Exercise Prescription;Able to understand and use Dyspnea scale;Increase Strength and Stamina;Able to check pulse independently Increase Physical Activity;Increase Strength and Stamina;Understanding of Exercise Prescription Increase Physical Activity;Increase Strength and Stamina;Understanding of Exercise Prescription     Comments Reviewed RPE and dyspnea scale, THR and program prescription with pt today.  Pt voiced understanding and was given a copy of goals to take home. Ferman is off to a good start in the program, and was able to attend his first few sessions during this review period. During his sessions he was able to use the T5 nustep and recumbent bike at level 3. We  will continue to monitor his progress in the program. Craig is doing well in rehab. He was recently able to increase from level 3 to 4 on the T4 nustep, and increase from level 3 to 5 on the recumbent bike. We will continue to monitor his  progress in the program.     Expected Outcomes Short: Use RPE daily to regulate intensity.  Long: Follow program prescription in THR. Short: Continue to follow exercise prescription. Long: Continue exercise to improve strength and stamina. Short: Continue to increase Nustep workload. Long: Continue exercise to improve strength and stamina.        Discharge Exercise Prescription (Final Exercise Prescription Changes):  Exercise Prescription Changes - 12/14/23 1100       Response to Exercise   Blood Pressure (Admit) 96/56    Blood Pressure (Exercise) 120/66    Blood Pressure (Exit) 90/56    Heart Rate (Admit) 59 bpm    Heart Rate (Exercise) 96 bpm    Heart Rate (Exit) 66 bpm    Rating of Perceived Exertion (Exercise) 12    Perceived Dyspnea (Exercise) 0    Symptoms none    Duration Progress to 30 minutes of  aerobic without signs/symptoms of physical distress    Intensity THRR unchanged      Progression   Progression Continue to progress workloads to maintain intensity without signs/symptoms of physical distress.    Average METs 2.5      Resistance Training   Training Prescription Yes    Weight 4lb    Reps 10-15      Interval Training   Interval Training No      Recumbant Bike   Level 5    Watts 29    Minutes 15    METs 3.22      NuStep   Level 4    Minutes 15    METs 2.5      T5 Nustep   Level 4    Minutes 15    METs 2      Biostep-RELP   Level 3    Minutes 15    METs 3.1      Track   Laps 8    Minutes 15    METs 1.44      Oxygen   Maintain Oxygen Saturation 88% or higher          Nutrition:  Target Goals: Understanding of nutrition guidelines, daily intake of sodium 1500mg , cholesterol 200mg , calories 30% from fat and 7% or less from saturated fats, daily to have 5 or more servings of fruits and vegetables.  Education: Nutrition 1 -Group instruction provided by verbal, written material, interactive activities, discussions, models, and posters to  present general guidelines for heart healthy nutrition including macronutrients, label reading, and promoting whole foods over processed counterparts. Education serves as Pensions consultant of discussion of heart healthy eating for all. Written material provided at class time.    Education: Nutrition 2 -Group instruction provided by verbal, written material, interactive activities, discussions, models, and posters to present general guidelines for heart healthy nutrition including sodium, cholesterol, and saturated fat. Providing guidance of habit forming to improve blood pressure, cholesterol, and body weight. Written material provided at class time.     Biometrics:  Pre Biometrics - 11/12/23 1516       Pre Biometrics   Height 5' 1 (1.549 m)    Weight 163 lb (73.9 kg)    Waist Circumference 37.5 inches    Hip Circumference 39 inches  Waist to Hip Ratio 0.96 %    BMI (Calculated) 30.81    Single Leg Stand 11.41 seconds           Nutrition Therapy Plan and Nutrition Goals:   Nutrition Assessments:  MEDIFICTS Score Key: >=70 Need to make dietary changes  40-70 Heart Healthy Diet <= 40 Therapeutic Level Cholesterol Diet   Picture Your Plate Scores: <59 Unhealthy dietary pattern with much room for improvement. 41-50 Dietary pattern unlikely to meet recommendations for good health and room for improvement. 51-60 More healthful dietary pattern, with some room for improvement.  >60 Healthy dietary pattern, although there may be some specific behaviors that could be improved.    Nutrition Goals Re-Evaluation:   Nutrition Goals Discharge (Final Nutrition Goals Re-Evaluation):   Psychosocial: Target Goals: Acknowledge presence or absence of significant depression and/or stress, maximize coping skills, provide positive support system. Participant is able to verbalize types and ability to use techniques and skills needed for reducing stress and depression.   Education: Stress,  Anxiety, and Depression - Group verbal and visual presentation to define topics covered.  Reviews how body is impacted by stress, anxiety, and depression.  Also discusses healthy ways to reduce stress and to treat/manage anxiety and depression. Written material provided at class time.   Education: Sleep Hygiene -Provides group verbal and written instruction about how sleep can affect your health.  Define sleep hygiene, discuss sleep cycles and impact of sleep habits. Review good sleep hygiene tips.   Initial Review & Psychosocial Screening:  Initial Psych Review & Screening - 11/12/23 1357       Initial Review   Current issues with Current Stress Concerns    Source of Stress Concerns Chronic Illness      Family Dynamics   Good Support System? Yes      Barriers   Psychosocial barriers to participate in program There are no identifiable barriers or psychosocial needs.;The patient should benefit from training in stress management and relaxation.      Screening Interventions   Interventions Encouraged to exercise;Provide feedback about the scores to participant;To provide support and resources with identified psychosocial needs    Expected Outcomes Short Term goal: Utilizing psychosocial counselor, staff and physician to assist with identification of specific Stressors or current issues interfering with healing process. Setting desired goal for each stressor or current issue identified.;Long Term Goal: Stressors or current issues are controlled or eliminated.;Short Term goal: Identification and review with participant of any Quality of Life or Depression concerns found by scoring the questionnaire.;Long Term goal: The participant improves quality of Life and PHQ9 Scores as seen by post scores and/or verbalization of changes          Quality of Life Scores:   Quality of Life - 11/12/23 1523       Quality of Life   Select Quality of Life      Quality of Life Scores   Health/Function  Pre 6.1 %    Socioeconomic Pre 20.06 %    Psych/Spiritual Pre 18.43 %    Family Pre 20.4 %    GLOBAL Pre 13.8 %         Scores of 19 and below usually indicate a poorer quality of life in these areas.  A difference of  2-3 points is a clinically meaningful difference.  A difference of 2-3 points in the total score of the Quality of Life Index has been associated with significant improvement in overall quality of life, self-image, physical  symptoms, and general health in studies assessing change in quality of life.  PHQ-9: Review Flowsheet  More data exists      11/12/2023 08/13/2023 12/26/2022 10/24/2022 08/07/2022  Depression screen PHQ 2/9  Decreased Interest 1 0 0 0 1  Down, Depressed, Hopeless 1 0 0 1 1  PHQ - 2 Score 2 0 0 1 2  Altered sleeping 0 - 0 0 1  Tired, decreased energy 2 - 0 1 1  Change in appetite 0 - 0 0 0  Feeling bad or failure about yourself  0 - 0 0 0  Trouble concentrating 0 - 0 0 0  Moving slowly or fidgety/restless 1 - 0 0 0  Suicidal thoughts 1 - 0 3 0  PHQ-9 Score 6 - 0 5 4  Difficult doing work/chores Not difficult at all - Not difficult at all Somewhat difficult Not difficult at all   Interpretation of Total Score  Total Score Depression Severity:  1-4 = Minimal depression, 5-9 = Mild depression, 10-14 = Moderate depression, 15-19 = Moderately severe depression, 20-27 = Severe depression   Psychosocial Evaluation and Intervention:  Psychosocial Evaluation - 11/12/23 1357       Psychosocial Evaluation & Interventions   Interventions Encouraged to exercise with the program and follow exercise prescription    Comments Mr. Carrington is coming to cardiac rehab with systolic HF. He is accompanied by a family member who helps translate. When asked about stress, he mentioned some days he feels more stressed than others. His stress stems from his shortness of breath and disease process with heart failure. He reports good sleep habits.    Expected Outcomes Short:  attend cardiac rehab for education and exercise Long: develop and maintain positive self care habits    Continue Psychosocial Services  Follow up required by staff          Psychosocial Re-Evaluation:   Psychosocial Discharge (Final Psychosocial Re-Evaluation):   Vocational Rehabilitation: Provide vocational rehab assistance to qualifying candidates.   Vocational Rehab Evaluation & Intervention:  Vocational Rehab - 11/12/23 1357       Initial Vocational Rehab Evaluation & Intervention   Assessment shows need for Vocational Rehabilitation No          Education: Education Goals: Education classes will be provided on a variety of topics geared toward better understanding of heart health and risk factor modification. Participant will state understanding/return demonstration of topics presented as noted by education test scores.  Learning Barriers/Preferences:  Learning Barriers/Preferences - 11/12/23 1357       Learning Barriers/Preferences   Learning Barriers Language    Learning Preferences Individual Instruction          General Cardiac Education Topics:  AED/CPR: - Group verbal and written instruction with the use of models to demonstrate the basic use of the AED with the basic ABC's of resuscitation.   Test and Procedures: - Group verbal and visual presentation and models provide information about basic cardiac anatomy and function. Reviews the testing methods done to diagnose heart disease and the outcomes of the test results. Describes the treatment choices: Medical Management, Angioplasty, or Coronary Bypass Surgery for treating various heart conditions including Myocardial Infarction, Angina, Valve Disease, and Cardiac Arrhythmias. Written material provided at class time.   Medication Safety: - Group verbal and visual instruction to review commonly prescribed medications for heart and lung disease. Reviews the medication, class of the drug, and side effects.  Includes the steps to properly store meds and maintain  the prescription regimen. Written material provided at class time.   Intimacy: - Group verbal instruction through game format to discuss how heart and lung disease can affect sexual intimacy. Written material provided at class time.   Know Your Numbers and Heart Failure: - Group verbal and visual instruction to discuss disease risk factors for cardiac and pulmonary disease and treatment options.  Reviews associated critical values for Overweight/Obesity, Hypertension, Cholesterol, and Diabetes.  Discusses basics of heart failure: signs/symptoms and treatments.  Introduces Heart Failure Zone chart for action plan for heart failure. Written material provided at class time.   Infection Prevention: - Provides verbal and written material to individual with discussion of infection control including proper hand washing and proper equipment cleaning during exercise session. Flowsheet Row Cardiac Rehab from 11/12/2023 in [facility] Cardiac and Pulmonary Rehab  Date 11/12/23  Educator MJC  Instruction Review Code 1- Verbalizes Understanding    Falls Prevention: - Provides verbal and written material to individual with discussion of falls prevention and safety. Flowsheet Row Cardiac Rehab from 11/12/2023 in [facility] Cardiac and Pulmonary Rehab  Date 11/12/23  Educator MJC  Instruction Review Code 1- Verbalizes Understanding    Other: -Provides group and verbal instruction on various topics (see comments)   Knowledge Questionnaire Score:   Core Components/Risk Factors/Patient Goals at Admission:  Personal Goals and Risk Factors at Admission - 11/12/23 1356       Core Components/Risk Factors/Patient Goals on Admission    Weight Management Weight Maintenance;Yes    Intervention Weight Management: Develop a combined nutrition and exercise program designed to reach desired caloric intake, while maintaining appropriate intake of nutrient and fiber,  sodium and fats, and appropriate energy expenditure required for the weight goal.;Weight Management: Provide education and appropriate resources to help participant work on and attain dietary goals.    Expected Outcomes Short Term: Continue to assess and modify interventions until short term weight is achieved;Weight Maintenance: Understanding of the daily nutrition guidelines, which includes 25-35% calories from fat, 7% or less cal from saturated fats, less than 200mg  cholesterol, less than 1.5gm of sodium, & 5 or more servings of fruits and vegetables daily;Long Term: Adherence to nutrition and physical activity/exercise program aimed toward attainment of established weight goal;Understanding recommendations for meals to include 15-35% energy as protein, 25-35% energy from fat, 35-60% energy from carbohydrates, less than 200mg  of dietary cholesterol, 20-35 gm of total fiber daily;Understanding of distribution of calorie intake throughout the day with the consumption of 4-5 meals/snacks    Diabetes Yes    Intervention Provide education about signs/symptoms and action to take for hypo/hyperglycemia.;Provide education about proper nutrition, including hydration, and aerobic/resistive exercise prescription along with prescribed medications to achieve blood glucose in normal ranges: Fasting glucose 65-99 mg/dL    Expected Outcomes Short Term: Participant verbalizes understanding of the signs/symptoms and immediate care of hyper/hypoglycemia, proper foot care and importance of medication, aerobic/resistive exercise and nutrition plan for blood glucose control.;Long Term: Attainment of HbA1C < 7%.    Heart Failure Yes    Intervention Provide a combined exercise and nutrition program that is supplemented with education, support and counseling about heart failure. Directed toward relieving symptoms such as shortness of breath, decreased exercise tolerance, and extremity edema.    Expected Outcomes Improve functional  capacity of life;Short term: Attendance in program 2-3 days a week with increased exercise capacity. Reported lower sodium intake. Reported increased fruit and vegetable intake. Reports medication compliance.;Short term: Daily weights obtained and reported for increase. Utilizing diuretic  protocols set by physician.;Long term: Adoption of self-care skills and reduction of barriers for early signs and symptoms recognition and intervention leading to self-care maintenance.    Lipids Yes    Intervention Provide education and support for participant on nutrition & aerobic/resistive exercise along with prescribed medications to achieve LDL 70mg , HDL >40mg .    Expected Outcomes Short Term: Participant states understanding of desired cholesterol values and is compliant with medications prescribed. Participant is following exercise prescription and nutrition guidelines.;Long Term: Cholesterol controlled with medications as prescribed, with individualized exercise RX and with personalized nutrition plan. Value goals: LDL < 70mg , HDL > 40 mg.          Education:Diabetes - Individual verbal and written instruction to review signs/symptoms of diabetes, desired ranges of glucose level fasting, after meals and with exercise. Acknowledge that pre and post exercise glucose checks will be done for 3 sessions at entry of program. Flowsheet Row Cardiac Rehab from 11/12/2023 in [facility] Cardiac and Pulmonary Rehab  Date 11/12/23  Educator MJC  Instruction Review Code 1- Verbalizes Understanding    Core Components/Risk Factors/Patient Goals Review:    Core Components/Risk Factors/Patient Goals at Discharge (Final Review):    ITP Comments:  ITP Comments     Row Name 11/12/23 1350 11/12/23 1509 11/16/23 1559 11/25/23 0953 12/23/23 0820   ITP Comments Initial orientation completed. Diagnosis can be found in [facility] 8/12. EP orientation today at 2pm. Completed and gym orientation for cardiac rehab. Initial ITP  created and sent for review to Dr. Fuad Aleskerov, Medical Director. First full day of exercise!  Patient was oriented to gym and equipment including functions, settings, policies, and procedures.  Patient's individual exercise prescription and treatment plan were reviewed.  All starting workloads were established based on the results of the 6 minute walk test done at initial orientation visit.  The plan for exercise progression was also introduced and progression will be customized based on patient's performance and goals. 30 Day review completed. Medical Director ITP review done, changes made as directed, and signed approval by Medical Director. New to Program. 30 Day review completed. Medical Director ITP review done, changes made as directed, and signed approval by Medical Director.      Comments: Discharge ITP

## 2023-12-24 NOTE — Telephone Encounter (Signed)
 FYI Only or Action Required?: FYI only for provider.  Patient was last seen in primary care on 10/26/2023 by Edman Marsa PARAS, DO.  Called Nurse Triage reporting Heart Problem.  Symptoms began several weeks ago.  Interventions attempted: Nothing.  Symptoms are: stable.  Triage Disposition: See Physician Within 24 Hours  Patient/caregiver understands and will follow disposition?: Yes  Reason for Disposition  Rash in same area as pain (may be described as small blisters)    Swollen Nipple  Answer Assessment - Initial Assessment Questions Swollen right nipple, denies any cracking, dry skin or nipple drainage.   1. LOCATION: Where does it hurt?       Around the nipple area on right side  2. RADIATION: Does the pain go anywhere else? (e.g., into neck, jaw, arms, back)     Denies  3. ONSET: When did the chest pain begin? (Minutes, hours or days)      3 weeks  4. PATTERN: Does the pain come and go, or has it been constant since it started?  Does it get worse with exertion?      Constant  5. SEVERITY: How bad is the pain?  (e.g., Scale 1-10; mild, moderate, or severe)     Just painful to touch   6. CARDIAC RISK FACTORS: Do you have any history of heart problems or risk factors for heart disease? (e.g., angina, prior heart attack; diabetes, high blood pressure, high cholesterol, smoker, or strong family history of heart disease)     Heart failure  7. CAUSE: What do you think is causing the chest pain?     Unsure  Protocols used: Chest Pain-A-AH  Copied from CRM [id number]. Topic: Clinical - Red Word Triage >> Dec 24, 2023  2:08 PM Pinkey ORN wrote: Red Word that prompted transfer to Nurse Triage: Severe Back Pain >> Dec 24, 2023  2:09 PM Pinkey ORN wrote: Patient states he's experiencing severe back pain, as well as has some concerns with his heart monitor.

## 2023-12-24 NOTE — Patient Instructions (Signed)
 Discharge Patient Instructions  Patient Details  Name: Ross Riley MRN: [id number] Date of Birth: Apr 24, 1950 Referring Provider:  Edman Blunt *   Number of Visits: 13  Reason for Discharge:  Early Exit:  Personal- back pain  Diagnosis:  Heart failure, chronic systolic [facility])  Initial Exercise Prescription:  Initial Exercise Prescription - 11/12/23 1500       Date of Initial Exercise RX and Referring Provider   Date 11/12/23    Referring Provider Dr. Ezra Shuck      Oxygen   Maintain Oxygen Saturation 88% or higher      Recumbant Bike   Level 1    RPM 50    Watts 25    Minutes 15    METs 2      NuStep   Level 1    SPM 80    Minutes 15    METs 2      T5 Nustep   Level 1    SPM 80    Minutes 15    METs 2      Biostep-RELP   Level 1    SPM 50    Minutes 15    METs 2      Track   Laps 10    Minutes 15    METs 1.54      Prescription Details   Duration Progress to 30 minutes of continuous aerobic without signs/symptoms of physical distress      Intensity   THRR 40-80% of Max Heartrate 97-131    Ratings of Perceived Exertion 11-13    Perceived Dyspnea 0-4      Progression   Progression Continue to progress workloads to maintain intensity without signs/symptoms of physical distress.      Resistance Training   Training Prescription Yes    Weight 4lb    Reps 10-15          Discharge Exercise Prescription (Final Exercise Prescription Changes):  Exercise Prescription Changes - 12/14/23 1100       Response to Exercise   Blood Pressure (Admit) 96/56    Blood Pressure (Exercise) 120/66    Blood Pressure (Exit) 90/56    Heart Rate (Admit) 59 bpm    Heart Rate (Exercise) 96 bpm    Heart Rate (Exit) 66 bpm    Rating of Perceived Exertion (Exercise) 12    Perceived Dyspnea (Exercise) 0    Symptoms none    Duration Progress to 30 minutes of  aerobic without signs/symptoms of physical distress    Intensity THRR unchanged       Progression   Progression Continue to progress workloads to maintain intensity without signs/symptoms of physical distress.    Average METs 2.5      Resistance Training   Training Prescription Yes    Weight 4lb    Reps 10-15      Interval Training   Interval Training No      Recumbant Bike   Level 5    Watts 29    Minutes 15    METs 3.22      NuStep   Level 4    Minutes 15    METs 2.5      T5 Nustep   Level 4    Minutes 15    METs 2      Biostep-RELP   Level 3    Minutes 15    METs 3.1      Track   Laps 8  Minutes 15    METs 1.44      Oxygen   Maintain Oxygen Saturation 88% or higher          Functional Capacity:  6 Minute Walk     Row Name 11/12/23 1510 12/24/23 1540       6 Minute Walk   Phase Initial Discharge    Distance 380 feet 625 feet    Distance % Change -- 64.5 %    Distance Feet Change -- 245 ft    Walk Time 5.6 minutes 6 minutes    # of Rest Breaks 2 0    MPH 0.7 1.18    METS 1 1.12    RPE 13 11    Perceived Dyspnea  3 1    VO2 Peak 1.9 3.92    Symptoms Yes (comment) No    Comments Dyspnea, left ankle pain, brief spell of dizziness --    Resting HR 64 bpm 54 bpm    Resting BP 96/54 108/54    Resting Oxygen Saturation  95 % 93 %    Exercise Oxygen Saturation  during 6 min walk 97 % 95 %    Max Ex. HR 79 bpm 93 bpm    Max Ex. BP 104/54 112/58    2 Minute Post BP 92/52 --      Nutrition & Weight - Outcomes:  Pre Biometrics - 11/12/23 1516       Pre Biometrics   Height 5' 1 (1.549 m)    Weight 163 lb (73.9 kg)    Waist Circumference 37.5 inches    Hip Circumference 39 inches    Waist to Hip Ratio 0.96 %    BMI (Calculated) 30.81    Single Leg Stand 11.41 seconds          Post Biometrics - 12/24/23 1542        Post  Biometrics   Height 5' 1 (1.549 m)    Weight 164 lb 12.8 oz (74.8 kg)    Waist Circumference 38 inches    Hip Circumference 37 inches    Waist to Hip Ratio 1.03 %    BMI (Calculated) 31.15     Single Leg Stand 12.38 seconds

## 2023-12-24 NOTE — Progress Notes (Signed)
 Daily Session Note  Patient Details  Name: Ross Riley MRN: [id number] Date of Birth: 17-Oct-1950 Referring Provider:   Flowsheet Row Cardiac Rehab from 11/12/2023 in [facility] Cardiac and Pulmonary Rehab  Referring Provider Dr. Ezra Shuck    Encounter Date: 12/24/2023  Check In:  Session Check In - 12/24/23 1536       Check-In   Supervising physician immediately available to respond to emergencies See telemetry face sheet for immediately available ER MD    Location [facility]-Cardiac & Pulmonary Rehab    Staff Present Leita Franks RN,BSN;[facility] BS, Exercise Physiologist;Noah Tickle, BS, Exercise Physiologist    Virtual Visit No    Medication changes reported     No    Fall or balance concerns reported    No    Tobacco Cessation No Change    Warm-up and Cool-down Performed on first and last piece of equipment    Resistance Training Performed Yes    VAD Patient? No    PAD/SET Patient? No      Pain Assessment   Currently in Pain? No/denies             Social History   Tobacco Use  Smoking Status Former   Current packs/day: 0.00   Average packs/day: 1 pack/day for 32.0 years (32.0 ttl pk-yrs)   Types: Cigarettes   Start date: 03/04/1959   Quit date: 03/04/1991   Years since quitting: 32.8  Smokeless Tobacco Former    Goals Met:  Independence with exercise equipment Exercise tolerated well No report of concerns or symptoms today Strength training completed today  Goals Unmet:  Not Applicable  Comments:  Ross Riley graduated today from  rehab with 13 sessions completed.  Details of the patient's exercise prescription and what He needs to do in order to continue the prescription and progress were discussed with patient.  Patient was given a copy of prescription and goals.  Patient verbalized understanding. Ross Riley plans to continue to exercise by walking at home.    Dr. Oneil Pinal is Medical Director for [facility] Cardiac Rehabilitation.   Dr. Fuad Aleskerov is Medical Director for [facility] Pulmonary Rehabilitation.

## 2023-12-24 NOTE — Progress Notes (Signed)
 Discharge Summary   Ross Riley  05-12-1950   [name] graduated today from  rehab with 13 sessions completed.  Details of the patient's exercise prescription and what He needs to do in order to continue the prescription and progress were discussed with patient.  Patient was given a copy of prescription and goals.  Patient verbalized understanding. Sherril plans to continue to exercise by walking at home.   6 Minute Walk     Row Name 11/12/23 1510         6 Minute Walk   Phase Initial     Distance 380 feet     Walk Time 5.6 minutes     # of Rest Breaks 2     MPH 0.7     METS 1     RPE 13     Perceived Dyspnea  3     VO2 Peak 1.9     Symptoms Yes (comment)     Comments Dyspnea, left ankle pain, brief spell of dizziness     Resting HR 64 bpm     Resting BP 96/54     Resting Oxygen Saturation  95 %     Exercise Oxygen Saturation  during 6 min walk 97 %     Max Ex. HR 79 bpm     Max Ex. BP 104/54     2 Minute Post BP 92/52

## 2023-12-25 ENCOUNTER — Ambulatory Visit: Admitting: Family Medicine

## 2023-12-25 ENCOUNTER — Encounter: Payer: Self-pay | Admitting: Family Medicine

## 2023-12-25 VITALS — BP 124/68 | HR 93 | Ht 61.0 in | Wt 165.2 lb

## 2023-12-25 DIAGNOSIS — N644 Mastodynia: Secondary | ICD-10-CM | POA: Diagnosis not present

## 2023-12-25 DIAGNOSIS — Z23 Encounter for immunization: Secondary | ICD-10-CM

## 2023-12-25 DIAGNOSIS — N62 Hypertrophy of breast: Secondary | ICD-10-CM | POA: Diagnosis not present

## 2023-12-25 DIAGNOSIS — G8929 Other chronic pain: Secondary | ICD-10-CM

## 2023-12-25 DIAGNOSIS — M545 Low back pain, unspecified: Secondary | ICD-10-CM | POA: Diagnosis not present

## 2023-12-25 MED ORDER — OXYCODONE HCL 5 MG PO TABS: 5.0000 mg | ORAL_TABLET | ORAL | 0 refills | Status: DC | PRN

## 2023-12-25 NOTE — Progress Notes (Signed)
 Subjective:    Patient ID: Ross Riley, male    DOB: 01-11-1951, 73 y.o.   MRN: [id number]  Ross Riley is a 73 y.o. male presenting on 12/25/2023 for Breast Problem   HPI  Video Spanish Language Interpreter - [place] #[id number]  Discussed the use of AI scribe software for clinical note transcription with the patient, who gave verbal consent to proceed.  History of Present Illness   Ross Riley is a 73 year old male who presents with a swollen, painful nipple.  Right Nipple pain and swelling - Swollen and painful nipple for the past few weeks - Pain localized to the center of the nipple only R side - Pain occurs with touch or pressure - Pain has remained unchanged since onset - No redness, rash, or discharge - No use of creams, topicals, or medications for this issue  Chronic Low Back pain - History of back pain. Last visit focusing on this 04/2023 - Previously prescribed oxycodone  5 mg every six hours with good effect - He functions well with episodes of pain on occasion only - Requests refill of oxycodone   Additional history he has CHF and is on Spironolactone  12.5mg  daily      11/12/2023    3:17 PM 08/13/2023    2:55 PM 12/26/2022    2:03 PM  Depression screen PHQ 2/9  Decreased Interest 1 0 0  Down, Depressed, Hopeless 1 0 0  PHQ - 2 Score 2 0 0  Altered sleeping 0  0  Tired, decreased energy 2  0  Change in appetite 0  0  Feeling bad or failure about yourself  0  0  Trouble concentrating 0  0  Moving slowly or fidgety/restless 1  0  Suicidal thoughts 1  0  PHQ-9 Score 6  0  Difficult doing work/chores Not difficult at all  Not difficult at all       12/26/2022    2:03 PM 10/24/2022    2:26 PM 06/23/2022    4:34 PM 04/25/2022   10:24 AM  GAD 7 : Generalized Anxiety Score  Nervous, Anxious, on Edge 0 0 0 1  Control/stop worrying 0 0 0 1  Worry too much - different things 0 0 0 0  Trouble relaxing 0 1 0 1  Restless 0 1 0 0  Easily annoyed or  irritable 0 0 0 0  Afraid - awful might happen 0 0 0 0  Total GAD 7 Score 0 2 0 3  Anxiety Difficulty Not difficult at all Not difficult at all  Not difficult at all    Social History   Tobacco Use   Smoking status: Former    Current packs/day: 0.00    Average packs/day: 1 pack/day for 32.0 years (32.0 ttl pk-yrs)    Types: Cigarettes    Start date: 03/04/1959    Quit date: 03/04/1991    Years since quitting: 32.8   Smokeless tobacco: Former  Building services engineer status: Never Used  Substance Use Topics   Alcohol use: Yes    Alcohol/week: 7.0 standard drinks of alcohol    Types: 7 Shots of liquor per week    Comment: 1-2 shots of vodka daily   Drug use: No    Review of Systems Per HPI unless specifically indicated above     Objective:    BP 124/68 (BP Location: Right Arm, Patient Position: Sitting, Cuff Size: Normal)   Pulse 93   Ht 5'  1 (1.549 m)   Wt 165 lb 4 oz (75 kg)   SpO2 98%   BMI 31.22 kg/m   Wt Readings from Last 3 Encounters:  12/25/23 165 lb 4 oz (75 kg)  12/24/23 164 lb 12.8 oz (74.8 kg)  11/12/23 163 lb (73.9 kg)    Physical Exam Vitals and nursing note reviewed.  Constitutional:      General: He is not in acute distress.    Appearance: Normal appearance. He is well-developed. He is not diaphoretic.     Comments: Well-appearing, comfortable, cooperative  HENT:     Head: Normocephalic and atraumatic.  Eyes:     General:        Right eye: No discharge.        Left eye: No discharge.     Conjunctiva/sclera: Conjunctivae normal.  Cardiovascular:     Rate and Rhythm: Normal rate.  Pulmonary:     Effort: Pulmonary effort is normal.  Chest:    Skin:    General: Skin is warm and dry.     Findings: No erythema or rash.  Neurological:     Mental Status: He is alert and oriented to person, place, and time.  Psychiatric:        Mood and Affect: Mood normal.        Behavior: Behavior normal.        Thought Content: Thought content normal.      Comments: Well groomed, good eye contact, normal speech and thoughts     I have personally reviewed the radiology report from Chest X-ray s/p ICD.  CLINICAL DATA:  Status post ICD placement   EXAM: CHEST - 2 VIEW   COMPARISON:  03/11/2023   FINDINGS: Interval placement of left chest wall ICD with leads in the right atrial appendage and right ventricle. No pneumothorax identified. Heart size and mediastinal contours appear within normal limits. There is no pleural fluid, interstitial edema or airspace disease. Degenerative disc disease noted within the thoracic spine.   IMPRESSION: Interval placement of left chest wall ICD. No acute findings. No pneumothorax identified.     Electronically Signed   By: Waddell Calk M.D.   On: 07/07/2023 06:42  Results for orders placed or performed in visit on 11/19/23  Glucose, capillary   Collection Time: 11/19/23  4:33 PM  Result Value Ref Range   Glucose-Capillary 125 (H) 70 - 99 mg/dL      Assessment & Plan:   Problem List Items Addressed This Visit   None Visit Diagnoses       Breast pain, right    -  Primary     Gynecomastia         Flu vaccine need       Relevant Orders   Flu vaccine HIGH DOSE PF(Fluzone Trivalent) (Completed)     Chronic left-sided low back pain without sciatica       Relevant Medications   oxyCODONE  (OXY IR/ROXICODONE ) 5 MG immediate release tablet        Right nipple pain and subcutaneous swelling / tissue growth on R not L Pain and swelling localized to right nipple - however no evidence of skin involvement, no rash, no superficial swelling, no discharge, no palpable cyst or abscess. Possible glandular tissue soreness or swelling. No infection signs. Suspect may be gynecomastia due to Spironolactone  started 06/2023, he is on low dose half tab 25mg  = 12.5mg  daily  He may try temporary options for superficial pain relief if interested - Recommend  Voltaren  cream for inflammation and pain relief. -  Suggest lidocaine  cream if Voltaren  is ineffective.  However if it is gynecomastia it would be secondary to Spironolactone  and may warrant discontinuation of therapy. This is managed by [facility] Cardiology team. I will contact his CHF team by routing this chart to [facility] FNP who has apt with him on 01/13/24 and ask for recommendation if patient can tolerate discontinuing Spironolactone  and can notify patient next week of the answer  We can consider imaging as well to confirm diagnosis - Advise ultrasound if no improvement in 1-2 weeks.  Chronic low back pain Recurrent low back pain managed with oxycodone . No new symptoms. - Prescribe oxycodone  5 mg, one every six hours as needed. - Send prescription to [facility] in [place].       Orders Placed This Encounter  Procedures   Flu vaccine HIGH DOSE PF(Fluzone Trivalent)    Meds ordered this encounter  Medications   oxyCODONE  (OXY IR/ROXICODONE ) 5 MG immediate release tablet    Sig: Take 1 tablet (5 mg total) by mouth every 4 (four) hours as needed for severe pain (pain score 7-10).    Dispense:  20 tablet    Refill:  0    Follow up plan: Return if symptoms worsen or fail to improve.   Marsa Officer, DO [facility] 12/25/2023, 3:59 PM

## 2023-12-25 NOTE — Patient Instructions (Addendum)
 Thank you for coming to the office today.  For the nipple / breast pain  1st try  START anti inflammatory topical - OTC Voltaren  (generic Diclofenac ) topical 2-4 times a day as needed for pain swelling of affected joint for 1-2 weeks or longer.  If that does not work to reduce inflammation or pain  2nd try  Aspercreme with Lidocaine  or any OTC Lidocaine  cream topical option to reduce more nerve sensitivity and superficial skin pain.  I don't see any sign of infection or abscess or rash.  This is difficult to diagnose, as there are not many other symptoms or explanations that I have at this time.  If it does not improve with the creams and you would like to do more testing, please call me in 1-2 weeks and I can order an Ultrasound / Mammogram to investigate further  Re ordered Oxycodone  for the back pain.  Flu Shot today.   Please schedule a Follow-up Appointment to: Return if symptoms worsen or fail to improve.  If you have any other questions or concerns, please feel free to call the office or send a message through MyChart. You may also schedule an earlier appointment if necessary.  Additionally, you may be receiving a survey about your experience at our office within a few days to 1 week by e-mail or mail. We value your feedback.  Marsa Officer, DO [facility], [place]

## 2023-12-28 ENCOUNTER — Encounter

## 2023-12-28 ENCOUNTER — Other Ambulatory Visit: Payer: Self-pay

## 2023-12-28 ENCOUNTER — Telehealth: Payer: Self-pay | Admitting: Family Medicine

## 2023-12-28 NOTE — Telephone Encounter (Signed)
 I saw him on Friday 10/24 for his breast pain complaint. We talked about a few treatment options to help reduce symptoms, but I did contact him later and said that the medication Spironolactone  (heart failure medication) can be causing this breast side effect.  I spoke with his Cardiologist [facility] FNP and she agrees that if it continues to bother him it could be a side effect of Spironolactone . ------------------------------  I called and spoke with patient and his family Beckey, let him know to continue the treatment plan that I discussed to see if the symptoms improve with topical treatment Voltaren  / Lidocaine  PRN  He should CONTINUE taking Spironolactone  for now.  If he still has the same symptoms or problems when he sees Ellouise Class ([facility] Failure Clinic) on 01/13/24 - she can discuss SWITCHING Spironolactone  to a different version that may not cause the side effect called Eplerenone.  Marsa Officer, DO [facility] 12/28/2023, 11:43 AM

## 2023-12-29 ENCOUNTER — Encounter: Payer: Self-pay | Admitting: Cardiovascular Disease

## 2023-12-30 ENCOUNTER — Encounter

## 2023-12-31 ENCOUNTER — Encounter

## 2023-12-31 ENCOUNTER — Telehealth: Payer: Self-pay | Admitting: Cardiovascular Disease

## 2023-12-31 MED ORDER — TICAGRELOR 90 MG PO TABS: 90.0000 mg | ORAL_TABLET | Freq: Two times a day (BID) | ORAL | 1 refills | Status: DC

## 2023-12-31 NOTE — Telephone Encounter (Signed)
 Pt's medication was sent to pt's pharmacy as requested. Confirmation received.

## 2023-12-31 NOTE — Telephone Encounter (Signed)
*  STAT* If patient is at the pharmacy, call can be transferred to refill team.   1. Which medications need to be refilled? (please list name of each medication and dose if known) ticagrelor  (BRILINTA ) 90 MG TABS tablet    2. Would you like to learn more about the convenience, safety, & potential cost savings by using the [facility]?     3. Are you open to using the [facility] (Type [facility].  ).   4. Which pharmacy/location (including street and city if local pharmacy) is medication to be sent to? [id number] - [place], [place] - [address] AT [facility] [address]    5. Do they need a 30 day or 90 day supply? 90 day

## 2024-01-04 ENCOUNTER — Encounter

## 2024-01-06 ENCOUNTER — Encounter

## 2024-01-07 ENCOUNTER — Encounter

## 2024-01-07 ENCOUNTER — Other Ambulatory Visit: Payer: Self-pay | Admitting: Family Medicine

## 2024-01-07 ENCOUNTER — Telehealth: Payer: Self-pay

## 2024-01-07 DIAGNOSIS — N644 Mastodynia: Secondary | ICD-10-CM

## 2024-01-07 DIAGNOSIS — E1169 Type 2 diabetes mellitus with other specified complication: Secondary | ICD-10-CM

## 2024-01-07 DIAGNOSIS — N62 Hypertrophy of breast: Secondary | ICD-10-CM

## 2024-01-07 DIAGNOSIS — I252 Old myocardial infarction: Secondary | ICD-10-CM

## 2024-01-07 NOTE — Telephone Encounter (Signed)
 Copied from CRM [id number]. Topic: General - Other >> Jan 07, 2024  1:24 PM Santiya F wrote: Reason for CRM: Patient's daughter Beckey is calling in because patient's breast pain is gone, but he has swelling in the area. She says Dr. MARLA said he would put in an order for an ultrasound. She is requesting to have the order for the ultrasound put in.

## 2024-01-07 NOTE — Telephone Encounter (Signed)
 Levon, routing phone call to you. I have just placed mammogram diagnostic and Right ultrasound orders for this patient. He speaks some English but mostly Spanish. His daughter Beckey speaks English and is good to call to coordinate with.  Can you assist with scheduling and notifying patient? The imaging is not urgent but usually diagnostic is done in near future. It can be next week that is fine.  Marsa Officer, DO [facility] 01/07/2024, 1:43 PM

## 2024-01-07 NOTE — Addendum Note (Signed)
 Addended by: EDMAN MARSA PARAS on: 01/07/2024 01:43 PM   Modules accepted: Orders

## 2024-01-08 NOTE — Telephone Encounter (Signed)
 Requested Prescriptions  Pending Prescriptions Disp Refills   atorvastatin  (LIPITOR ) 80 MG tablet [Pharmacy Med Name: ATORVASTATIN  80MG  TABLETS] 90 tablet 1    Sig: TAKE 1 TABLET(80 MG) BY MOUTH AT BEDTIME     Cardiovascular:  Antilipid - Statins Failed - 01/08/2024  4:26 PM      Failed - Lipid Panel in normal range within the last 12 months    Cholesterol, Total  Date Value Ref Range Status  07/01/2023 117 100 - 199 mg/dL Final   LDL Cholesterol (Calc)  Date Value Ref Range Status  04/27/2023 69 mg/dL (calc) Final    Comment:    Reference range: <100 . Desirable range <100 mg/dL for primary prevention;   <70 mg/dL for patients with CHD or diabetic patients  with > or = 2 CHD risk factors. SABRA LDL-C is now calculated using the Martin-Hopkins  calculation, which is a validated novel method providing  better accuracy than the Friedewald equation in the  estimation of LDL-C.  Gladis APPLETHWAITE et al. SANDREA. 7986;689(80): 2061-2068  (http://education.QuestDiagnostics.com/faq/FAQ164)    LDL Chol Calc (NIH)  Date Value Ref Range Status  07/01/2023 45 0 - 99 mg/dL Final   HDL  Date Value Ref Range Status  07/01/2023 46 >39 mg/dL Final   Triglycerides  Date Value Ref Range Status  07/01/2023 157 (H) 0 - 149 mg/dL Final         Passed - Patient is not pregnant      Passed - Valid encounter within last 12 months    Recent Outpatient Visits           2 weeks ago Breast pain, right   [facility] [place], Marsa PARAS, DO   2 months ago Type 2 diabetes mellitus with diabetic cataract, without long-term current use of insulin  [facility])   [facility], Marsa PARAS, DO   4 months ago Gross hematuria   [facility] [place], Angeline ORN, NP   7 months ago Right arm pain   [facility] Edman Marsa PARAS, DO   8 months ago Annual physical exam   [place] [facility] Ed[place] Marsa PARAS, DO       Future Appointments             In 2 months Maurine Lukes, PA-C [facility]

## 2024-01-11 ENCOUNTER — Encounter

## 2024-01-12 ENCOUNTER — Telehealth: Payer: Self-pay

## 2024-01-12 ENCOUNTER — Ambulatory Visit
Admission: RE | Admit: 2024-01-12 | Discharge: 2024-01-12 | Disposition: A | Discharge status: Home / Self Care | Source: Ambulatory Visit | Attending: Family Medicine | Admitting: Family Medicine

## 2024-01-12 ENCOUNTER — Other Ambulatory Visit: Payer: Self-pay | Admitting: Family Medicine

## 2024-01-12 DIAGNOSIS — N62 Hypertrophy of breast: Secondary | ICD-10-CM | POA: Insufficient documentation

## 2024-01-12 DIAGNOSIS — N644 Mastodynia: Secondary | ICD-10-CM | POA: Insufficient documentation

## 2024-01-12 DIAGNOSIS — N6320 Unspecified lump in the left breast, unspecified quadrant: Secondary | ICD-10-CM

## 2024-01-12 NOTE — Progress Notes (Unsigned)
 Advanced Heart Failure Clinic Note    PCP: Edman Marsa PARAS, DO Cardiology: Dr. Perla HF Cardiology: Dr. Rolan  Chief complaint: shortness of breath  HPI:   Ross Riley is a 73 y.o. male with a history of CAD, ischemic cardiomyopathy, diabetes, and HTN was referred by Dr. Gollan for evaluation of CHF. Patient was admitted in 10/24 with anterior MI. Cath showed occluded proximal LAD treated with DES, D1 and D2 jailed by stent, residual 60% distal LAD.  Echo in 10/24 showed EF 35-40%, normal RV.  Repeat echo in 3/25 showed EF 30-35%, anteroseptal akinesis, RV normal, mild Ross.    [employer] ICD placed in 5/25.   Patient returns a HF follow-up visit with a chief complaint of minimal shortness of breath. Has associated fatigue, occasional dizziness, right breast tenderness. Sleeping well on 2 pillows. Eating well. He recently had a mammogram done due to right breast tenderness / enlargement.   Bos Sci ICD interrogated: HL score is 0, 69% AP  Labs (2/25): LDL 69, K 4.5, creatinine 9.12, hgb 15.6  Labs (4/25): K 4.7 => 5.1, creatinine 0.97 => 1.08, BNP 64, LDL 45 Labs (7/25): A1c 8.1% Labs (8/25): K 4.7 => 5.4, creatinine 1.09 => 1.36, BNP 28 Labs (10/25): A1c 7.4%  PMH: 1. Type 2 diabetes 2. HTN 3. Hyperlipidemia 4. CAD: Anterior MI in 10/24, cath with occluded proximal LAD treated with DES, D1 and D2 jailed by stent, residual 60% distal LAD.  5. Chronic systolic CHF: Ischemic cardiomyopathy. [employer] ICD.  - Echo (10/24): EF 35-40%, normal RV - Echo (3/25): EF 30-35%, anteroseptal akinesis, RV normal, mild Ross.  6. Zio monitor (5/25): 1 run of 6 beats SVT.   SH: Retired, prior smoker but has quit. Prior heavy ETOH. Lives in [place] with wife. Spanish-speaking.   Family History  Problem Relation Age of Onset   Heart disease Mother    Arthritis Mother    Hypertension Brother    Diabetes Daughter    ROS: All systems reviewed and negative except as per  HPI.   Current Outpatient Medications  Medication Sig Dispense Refill   ACCU-CHEK AVIVA PLUS test strip Use to check blood sugar up to 1 x per day 100 each 12   Accu-Chek Softclix Lancets lancets Use to check blood sugar up to 1 x per day 100 each 12   atorvastatin  (LIPITOR ) 80 MG tablet TAKE 1 TABLET(80 MG) BY MOUTH AT BEDTIME 90 tablet 1   Blood Glucose Monitoring Suppl (ACCU-CHEK AVIVA PLUS) w/Device KIT Use to check blood sugar up to 1 x per day 1 kit 0   carvedilol  (COREG ) 6.25 MG tablet TAKE 1 TABLET(6.25 MG) BY MOUTH TWICE DAILY WITH A MEAL 60 tablet 3   dorzolamide -timolol  (COSOPT ) 22.3-6.8 MG/ML ophthalmic solution Place 1 drop into both eyes 2 (two) times daily.     ezetimibe  (ZETIA ) 10 MG tablet Take 1 tablet (10 mg total) by mouth daily. 90 tablet 2   finasteride  (PROSCAR ) 5 MG tablet Take 1 tablet (5 mg total) by mouth daily. 90 tablet 3   folic acid  (FOLVITE ) 1 MG tablet Take 1 tablet (1 mg total) by mouth daily. 30 tablet 2   gabapentin  (NEURONTIN ) 600 MG tablet Take 1 tablet (600 mg total) by mouth 3 (three) times daily. 270 tablet 3   isosorbide  mononitrate (IMDUR ) 30 MG 24 hr tablet TAKE 1/2 TABLET(15 MG) BY MOUTH DAILY 45 tablet 3   JARDIANCE  25 MG TABS tablet Take 25 mg by mouth  daily.     latanoprost  (XALATAN ) 0.005 % ophthalmic solution Place 1 drop into both eyes at bedtime.  6   levothyroxine  (SYNTHROID ) 75 MCG tablet Take 75 mcg by mouth daily before breakfast.     Multiple Vitamin (MULTIVITAMIN WITH MINERALS) TABS tablet Take 1 tablet by mouth daily. 30 tablet 2   nitroGLYCERIN  (NITROSTAT ) 0.4 MG SL tablet Place 1 tablet (0.4 mg total) under the tongue every 5 (five) minutes as needed for chest pain. 25 tablet 2   omeprazole  (PRILOSEC) 40 MG capsule Take 1 capsule (40 mg total) by mouth daily before breakfast. 90 capsule 1   oxyCODONE  (OXY IR/ROXICODONE ) 5 MG immediate release tablet Take 1 tablet (5 mg total) by mouth every 4 (four) hours as needed for severe pain  (pain score 7-10). 20 tablet 0   sacubitril -valsartan  (ENTRESTO ) 24-26 MG Take 1 tablet by mouth 2 (two) times daily. 180 tablet 3   spironolactone  (ALDACTONE ) 25 MG tablet Take 0.5 tablets (12.5 mg total) by mouth daily. 90 tablet 3   ticagrelor  (BRILINTA ) 90 MG TABS tablet Take 1 tablet (90 mg total) by mouth 2 (two) times daily. 180 tablet 1   No current facility-administered medications for this visit.   Vitals:   01/13/24 1410  BP: 105/71  Pulse: 69  SpO2: 97%  Weight: 163 lb 6.4 oz (74.1 kg)   Wt Readings from Last 3 Encounters:  01/13/24 163 lb 6.4 oz (74.1 kg)  12/25/23 165 lb 4 oz (75 kg)  12/24/23 164 lb 12.8 oz (74.8 kg)   Lab Results  Component Value Date   CREATININE 1.09 10/30/2023   CREATININE 1.36 (H) 10/13/2023   CREATININE 1.08 07/01/2023    Physical Exam:  General: Well appearing.  Cor: No JVD. Regular rhythm, rate. Tenderness over right breast.  Lungs: clear Abdomen: soft, nontender, nondistended. Extremities: no edema Neuro:. Affect pleasant   Assessment/Plan: 1. Chronic systolic CHF: Ischemic cardiomyopathy. [employer] ICD.  Most recent echo in 3/25 showed EF 30-35%, anteroseptal akinesis, RV normal, mild Ross.  NYHA class II. Euvolemic today - Continue Coreg  6.25 mg bid. - stop spironolactone  due to gynecomastia and begin eplerenone 25mg  daily. If symptoms continue, may need to stop this as well - Continue Jardiance  25 mg daily (DM dose).  - Continue Entresto  24/26mg  bid. BP will not allow for titration - BMET today - Completed cardiac rehab - HL score today is 0 2. CAD: Anterior MI in 10/24, cath with occluded proximal LAD treated with DES, D1 and D2 jailed by stent, residual 60% distal LAD. Rare atypical chest pain.  - Continue ticagrelor  90 bid.  - Continue atorvastatin  80 mg daily. Good lipids in 4/25.  3. Hyperlipidemia: Goal LDL < 55.  - Continue atorvastatin  80 mg daily.  - Continue Zetia  10mg  daily.    Return in 1 month, sooner  if needed.   I spent 30 minutes reviewing records, interviewing/ examing patient and managing plan/ orders.    Ross Riley Class 01/12/2024

## 2024-01-12 NOTE — Telephone Encounter (Signed)
 Spoke with Tempie at [facility]. They would also like to include the left breast in the ultrasound order along with the right. She is putting in an order for you to sign off on.

## 2024-01-12 NOTE — Telephone Encounter (Signed)
 I have cosigned the order  Marsa Officer, DO [facility] 01/12/2024, 12:23 PM

## 2024-01-13 ENCOUNTER — Encounter

## 2024-01-13 ENCOUNTER — Ambulatory Visit: Attending: Family | Admitting: Family

## 2024-01-13 ENCOUNTER — Other Ambulatory Visit: Payer: Self-pay | Admitting: Cardiology

## 2024-01-13 ENCOUNTER — Encounter: Payer: Self-pay | Admitting: Family

## 2024-01-13 ENCOUNTER — Ambulatory Visit (INDEPENDENT_AMBULATORY_CARE_PROVIDER_SITE_OTHER)

## 2024-01-13 VITALS — BP 105/71 | HR 69 | Wt 163.4 lb

## 2024-01-13 DIAGNOSIS — R42 Dizziness and giddiness: Secondary | ICD-10-CM | POA: Insufficient documentation

## 2024-01-13 DIAGNOSIS — N644 Mastodynia: Secondary | ICD-10-CM | POA: Diagnosis not present

## 2024-01-13 DIAGNOSIS — I255 Ischemic cardiomyopathy: Secondary | ICD-10-CM | POA: Insufficient documentation

## 2024-01-13 DIAGNOSIS — Z955 Presence of coronary angioplasty implant and graft: Secondary | ICD-10-CM | POA: Diagnosis not present

## 2024-01-13 DIAGNOSIS — N62 Hypertrophy of breast: Secondary | ICD-10-CM | POA: Diagnosis not present

## 2024-01-13 DIAGNOSIS — I2510 Atherosclerotic heart disease of native coronary artery without angina pectoris: Secondary | ICD-10-CM | POA: Insufficient documentation

## 2024-01-13 DIAGNOSIS — R5383 Other fatigue: Secondary | ICD-10-CM | POA: Diagnosis not present

## 2024-01-13 DIAGNOSIS — I110 Hypertensive heart disease with heart failure: Secondary | ICD-10-CM | POA: Insufficient documentation

## 2024-01-13 DIAGNOSIS — Z7902 Long term (current) use of antithrombotics/antiplatelets: Secondary | ICD-10-CM | POA: Insufficient documentation

## 2024-01-13 DIAGNOSIS — Z87891 Personal history of nicotine dependence: Secondary | ICD-10-CM | POA: Insufficient documentation

## 2024-01-13 DIAGNOSIS — I5022 Chronic systolic (congestive) heart failure: Secondary | ICD-10-CM

## 2024-01-13 DIAGNOSIS — R0789 Other chest pain: Secondary | ICD-10-CM | POA: Diagnosis not present

## 2024-01-13 DIAGNOSIS — I252 Old myocardial infarction: Secondary | ICD-10-CM | POA: Insufficient documentation

## 2024-01-13 DIAGNOSIS — E785 Hyperlipidemia, unspecified: Secondary | ICD-10-CM | POA: Diagnosis not present

## 2024-01-13 DIAGNOSIS — Z95810 Presence of automatic (implantable) cardiac defibrillator: Secondary | ICD-10-CM | POA: Diagnosis not present

## 2024-01-13 DIAGNOSIS — E119 Type 2 diabetes mellitus without complications: Secondary | ICD-10-CM | POA: Diagnosis not present

## 2024-01-13 DIAGNOSIS — Z79899 Other long term (current) drug therapy: Secondary | ICD-10-CM | POA: Diagnosis not present

## 2024-01-13 DIAGNOSIS — Z79890 Hormone replacement therapy (postmenopausal): Secondary | ICD-10-CM | POA: Insufficient documentation

## 2024-01-13 LAB — BASIC METABOLIC PANEL WITH GFR
BUN/Creatinine Ratio: 13 (ref 10–24)
BUN: 12 mg/dL (ref 8–27)
CO2: 23 mmol/L (ref 20–29)
Calcium: 9.4 mg/dL (ref 8.6–10.2)
Chloride: 104 mmol/L (ref 96–106)
Creatinine, Ser: 0.95 mg/dL (ref 0.76–1.27)
Glucose: 112 mg/dL — ABNORMAL HIGH (ref 70–99)
Potassium: 4.8 mmol/L (ref 3.5–5.2)
Sodium: 142 mmol/L (ref 134–144)
eGFR: 85 mL/min/1.73 (ref >59)

## 2024-01-13 MED ORDER — EPLERENONE 25 MG PO TABS: 25.0000 mg | ORAL_TABLET | Freq: Every day | ORAL | 3 refills | Status: AC

## 2024-01-13 NOTE — Patient Instructions (Signed)
 Medication Changes:  STOP Spironolactone   START Eplerenone 25mg  (1 tab) daily  Lab Work:  Go downstairs to [facility] on LOWER LEV[address] to have your blood work completed.  We will only call you if the results are abnormal or if the provider would like to make medication changes.  No news is good news.   Follow-Up in: Please follow up with the Advanced Heart Failure Clinic in 1 month with Ellouise Class, FNP.   Thank you for choosing [place] [facility].    At the Advanced Heart Failure Clinic, you and your health needs are our priority. We have a designated team specialized in the treatment of Heart Failure. This Care Team includes your primary Heart Failure Specialized Cardiologist (physician), Advanced Practice Providers (APPs- Physician Assistants and Nurse Practitioners), and Pharmacist who all work together to provide you with the care you need, when you need it.   You may see any of the following providers on your designated Care Team at your next follow up:  Dr. Toribio Fuel Dr. Ezra Shuck Dr. Ria Commander Dr. Morene Brownie Ellouise Class, FNP Jaun Bash, RPH-CPP  Please be sure to bring in all your medications bottles to every appointment.   Need to Contact Us :  If you have any questions or concerns before your next appointment please send us  a message through [place] or call our office at [phone].    TO LEAVE A MESSAGE FOR THE NURSE SELECT OPTION 2, PLEASE LEAVE A MESSAGE INCLUDING: YOUR NAME DATE OF BIRTH CALL BACK NUMBER REASON FOR CALL**this is important as we prioritize the call backs  YOU WILL RECEIVE A CALL BACK THE SAME DAY AS LONG AS YOU CALL BEFORE 4:00 PM

## 2024-01-14 ENCOUNTER — Ambulatory Visit: Payer: Self-pay | Admitting: Family

## 2024-01-14 ENCOUNTER — Encounter

## 2024-01-14 LAB — CUP PACEART REMOTE DEVICE CHECK
Battery Remaining Longevity: 150 mo
Battery Remaining Percentage: 100 %
Brady Statistic RA Percent Paced: 69 %
Brady Statistic RV Percent Paced: 0 %
Date Time Interrogation Session: 20251112014100
HighPow Impedance: 71 Ohm
Implantable Lead Connection Status: 753985
Implantable Lead Connection Status: 753985
Implantable Lead Implant Date: 20250505
Implantable Lead Implant Date: 20250505
Implantable Lead Location: 753859
Implantable Lead Location: 753860
Implantable Lead Model: 673
Implantable Lead Model: 7841
Implantable Lead Serial Number: 560644
Implantable Lead Serial Number: 7463987
Implantable Pulse Generator Implant Date: 20250505
Lead Channel Impedance Value: 584 Ohm
Lead Channel Impedance Value: 815 Ohm
Lead Channel Pacing Threshold Amplitude: 0.4 V
Lead Channel Pacing Threshold Amplitude: 0.9 V
Lead Channel Pacing Threshold Pulse Width: 0.4 ms
Lead Channel Pacing Threshold Pulse Width: 0.4 ms
Lead Channel Setting Pacing Amplitude: 3.5 V
Lead Channel Setting Pacing Amplitude: 3.5 V
Lead Channel Setting Pacing Pulse Width: 0.4 ms
Lead Channel Setting Sensing Sensitivity: 0.6 mV
Pulse Gen Serial Number: 695502
Zone Setting Status: 755011

## 2024-01-15 ENCOUNTER — Ambulatory Visit: Payer: Self-pay | Admitting: Cardiology

## 2024-01-18 ENCOUNTER — Encounter

## 2024-01-18 NOTE — Progress Notes (Signed)
 Remote ICD Transmission

## 2024-01-20 ENCOUNTER — Encounter

## 2024-01-21 ENCOUNTER — Encounter

## 2024-01-25 ENCOUNTER — Encounter

## 2024-01-27 ENCOUNTER — Encounter

## 2024-02-01 ENCOUNTER — Encounter

## 2024-02-03 ENCOUNTER — Encounter

## 2024-02-04 ENCOUNTER — Other Ambulatory Visit: Payer: Self-pay

## 2024-02-04 ENCOUNTER — Emergency Department

## 2024-02-04 ENCOUNTER — Emergency Department
Admission: EM | Admit: 2024-02-04 | Discharge: 2024-02-04 | Disposition: A | Discharge status: Home / Self Care | Attending: Emergency Medicine | Admitting: Emergency Medicine

## 2024-02-04 ENCOUNTER — Encounter

## 2024-02-04 DIAGNOSIS — R531 Weakness: Secondary | ICD-10-CM | POA: Diagnosis not present

## 2024-02-04 DIAGNOSIS — R42 Dizziness and giddiness: Secondary | ICD-10-CM | POA: Diagnosis not present

## 2024-02-04 DIAGNOSIS — I509 Heart failure, unspecified: Secondary | ICD-10-CM | POA: Insufficient documentation

## 2024-02-04 DIAGNOSIS — R519 Headache, unspecified: Secondary | ICD-10-CM | POA: Diagnosis present

## 2024-02-04 DIAGNOSIS — E119 Type 2 diabetes mellitus without complications: Secondary | ICD-10-CM | POA: Diagnosis not present

## 2024-02-04 LAB — COMPREHENSIVE METABOLIC PANEL WITH GFR
ALT: 20 U/L (ref 0–44)
AST: 28 U/L (ref 15–41)
Albumin: 3.7 g/dL (ref 3.5–5.0)
Alkaline Phosphatase: 56 U/L (ref 38–126)
Anion gap: 12 (ref 5–15)
BUN: 11 mg/dL (ref 8–23)
CO2: 19 mmol/L — ABNORMAL LOW (ref 22–32)
Calcium: 7.8 mg/dL — ABNORMAL LOW (ref 8.9–10.3)
Chloride: 110 mmol/L (ref 98–111)
Creatinine, Ser: 0.68 mg/dL (ref 0.61–1.24)
GFR, Estimated: >60 mL/min (ref >60)
Glucose, Bld: 95 mg/dL (ref 70–99)
Potassium: 3.9 mmol/L (ref 3.5–5.1)
Sodium: 140 mmol/L (ref 135–145)
Total Bilirubin: 1 mg/dL (ref 0.0–1.2)
Total Protein: 6.5 g/dL (ref 6.5–8.1)

## 2024-02-04 LAB — RESP PANEL BY RT-PCR (RSV, FLU A&B, COVID)  RVPGX2
Influenza A by PCR: NEGATIVE
Influenza B by PCR: NEGATIVE
Resp Syncytial Virus by PCR: NEGATIVE
SARS Coronavirus 2 by RT PCR: NEGATIVE

## 2024-02-04 LAB — URINALYSIS, ROUTINE W REFLEX MICROSCOPIC
Bacteria, UA: NONE SEEN
Bilirubin Urine: NEGATIVE
Glucose, UA: >=500 mg/dL — AB
Hgb urine dipstick: NEGATIVE
Ketones, ur: NEGATIVE mg/dL
Leukocytes,Ua: NEGATIVE
Nitrite: NEGATIVE
Protein, ur: NEGATIVE mg/dL
Specific Gravity, Urine: 1.032 — ABNORMAL HIGH (ref 1.005–1.030)
pH: 7 (ref 5.0–8.0)

## 2024-02-04 LAB — CBC
HCT: 47.9 % (ref 39.0–52.0)
Hemoglobin: 15.3 g/dL (ref 13.0–17.0)
MCH: 30.2 pg (ref 26.0–34.0)
MCHC: 31.9 g/dL (ref 30.0–36.0)
MCV: 94.5 fL (ref 80.0–100.0)
Platelets: 191 K/uL (ref 150–400)
RBC: 5.07 MIL/uL (ref 4.22–5.81)
RDW: 12.9 % (ref 11.5–15.5)
WBC: 5.6 K/uL (ref 4.0–10.5)
nRBC: 0 % (ref 0.0–0.2)

## 2024-02-04 MED ORDER — ACETAMINOPHEN 325 MG PO TABS
650.0000 mg | ORAL_TABLET | Freq: Once | ORAL | Status: AC
  Administered 2024-02-04: 650 mg via ORAL
  Filled 2024-02-04: qty 2

## 2024-02-04 NOTE — ED Provider Notes (Signed)
 [facility] Provider Note    Event Date/Time   First MD Initiated Contact with Patient 02/04/24 1254     (approximate)   History   Weakness   HPI  Ross Riley is a 73 year old male with history of T2DM, BPPV, CHF presenting to the emergency department for evaluation of fatigue.  Patient reports that he has felt more tired over the past few days.  Does report associated dizziness with this described as a spinning sensation.  Reports he has had similar dizziness in the past.  At the time of my evaluation reports that this is largely improved.  Does additionally report headache.  Not sudden in onset.  Feels similar to prior, but more intense.  No recent trauma.  Received DuoNeb and Tylenol  with EMS.    Physical Exam   Triage Vital Signs: ED Triage Vitals [02/04/24 1211]  Encounter Vitals Group     BP 123/89     Girls Systolic BP Percentile      Girls Diastolic BP Percentile      Boys Systolic BP Percentile      Boys Diastolic BP Percentile      Pulse Rate (!) 59     Resp 18     Temp 98.2 F (36.8 C)     Temp Source Oral     SpO2 100 %     Weight 165 lb (74.8 kg)     Height 5' 6 (1.676 m)     Head Circumference      Peak Flow      Pain Score 0     Pain Loc      Pain Education      Exclude from Growth Chart     Most recent vital signs: Vitals:   02/04/24 1250 02/04/24 1300  BP:  129/84  Pulse:  (!) 59  Resp:  18  Temp:    SpO2: 100% 100%     General: Awake, interactive  CV:  Good peripheral perfusion Resp:  Unlabored respirations, lungs clear to auscultation Abd:  Nondistended.  Neuro:  Keenly aware, correctly answers month and age, able to blink eyes and squeeze hands, normal horizontal extraocular movements, no visual field loss, normal facial symmetry, no arm or leg motor drift, no limb ataxia, normal sensation, no aphasia, no dysarthria, no inattention. NIH 0   ED Results / Procedures / Treatments   Labs (all labs ordered  are listed, but only abnormal results are displayed) Labs Reviewed  COMPREHENSIVE METABOLIC PANEL WITH GFR - Abnormal; Notable for the following components:      Result Value   CO2 19 (*)    Calcium  7.8 (*)    All other components within normal limits  URINALYSIS, ROUTINE W REFLEX MICROSCOPIC - Abnormal; Notable for the following components:   Color, Urine YELLOW (*)    APPearance CLEAR (*)    Specific Gravity, Urine 1.032 (*)    Glucose, UA >=500 (*)    All other components within normal limits  RESP PANEL BY RT-PCR (RSV, FLU A&B, COVID)  RVPGX2  CBC  CBG MONITORING, ED     EKG EKG independently reviewed and interpreted by myself demonstrates:  EKG demonstrates paced rhythm at a rate of 60, PR 178, QRS 62, QTc 416, no acute ST changes  RADIOLOGY Imaging independently reviewed and interpreted by myself demonstrates:  CXR without focal consolidation CT head without acute bleed  Formal Radiology Read:  CT Head Wo Contrast Result Date: 02/04/2024 CLINICAL  DATA:  Headache, new onset (Age >= 51y) EXAM: CT HEAD WITHOUT CONTRAST TECHNIQUE: Contiguous axial images were obtained from the base of the skull through the vertex without intravenous contrast. RADIATION DOSE REDUCTION: This exam was performed according to the departmental dose-optimization program which includes automated exposure control, adjustment of the mA and/or kV according to patient size and/or use of iterative reconstruction technique. COMPARISON:  Brain MRI 12/07/2019 FINDINGS: Brain: No intracranial hemorrhage. The known parafalcine meningioma along the posterior right falx is not well demonstrated by CT. There is no associated mass effect. Mild generalized atrophy. No hydrocephalus. The basilar cisterns are patent. Periventricular and deep white matter hypodensity typical of chronic small vessel ischemia. No evidence of territorial infarct or acute ischemia. No extra-axial or intracranial fluid collection. Vascular:  Atherosclerosis of skullbase vasculature without hyperdense vessel or abnormal calcification. Skull: No acute finding. Bony involvement of meningioma is not well demonstrated. Sinuses/Orbits: Paranasal sinuses and mastoid air cells are clear. The visualized orbits are unremarkable. Other: None. IMPRESSION: 1. No acute intracranial abnormality. 2. Mild atrophy and chronic small vessel ischemia. 3. The known parafalcine meningioma on MRI is not well demonstrated by CT. Electronically Signed   By: Andrea Gasman M.D.   On: 02/04/2024 15:43   DG Chest Portable 1 View Result Date: 02/04/2024 CLINICAL DATA:  weakness EXAM: PORTABLE CHEST - 1 VIEW COMPARISON:  Jul 07, 2023 FINDINGS: No focal airspace consolidation, pleural effusion, or pneumothorax. No cardiomegaly. Left chest pacemaker/AICD with leads terminating in the right atrium and right ventricle. Tortuous aorta with aortic atherosclerosis. No acute fracture or destructive lesions. Multilevel thoracic osteophytosis. IMPRESSION: No acute cardiopulmonary abnormality. Electronically Signed   By: Rogelia Myers M.D.   On: 02/04/2024 14:29    PROCEDURES:  Critical Care performed: No  Procedures   MEDICATIONS ORDERED IN ED: Medications  acetaminophen  (TYLENOL ) tablet 650 mg (650 mg Oral Given 02/04/24 1421)     IMPRESSION / MDM / ASSESSMENT AND PLAN / ED COURSE  I reviewed the triage vital signs and the nursing notes.  Differential diagnosis includes, but is not limited to, pneumonia, UTI, anemia, electrolyte abnormality, intracranial bleed, history suggestive of vertigo, consideration for BPPV with documented history, reports this is similar to prior episodes.  Lower suspicion CVA in the absence of other symptoms, significant improvement in symptoms on presentation here  Patient's presentation is most consistent with acute presentation with potential threat to life or bodily function.  73 year old male presenting with weakness with associated  headache and dizziness.  Stable vitals on presentation.  Reassuring CBC, CMP.  Urine without evidence of infection.  EKG without acute ischemic changes.  X-Cobi Aldape without pneumonia.  Given increase severity of his headache will obtain CT head.   CT head without acute bleed.  Patient reassessed.  Feels improved.  Does report mild ongoing headache but says this is not unusual for him.  Dizziness remains improved.  Did discuss further assessment here but patient reports he refers to be discharged home.  Description of largely chronic nature of symptoms, do think this is reasonable.  Has follow-up arranged with heart failure team.  Strict return precautions provided.  Patient discharged in stable condition.      FINAL CLINICAL IMPRESSION(S) / ED DIAGNOSES   Final diagnoses:  Generalized weakness  Acute nonintractable headache, unspecified headache type  Vertigo     Rx / DC Orders   ED Discharge Orders     None        Note:  This document was  prepared using Conservation officer, historic buildings and may include unintentional dictation errors.   Levander Slate, MD 02/04/24 [phone]

## 2024-02-04 NOTE — Discharge Instructions (Addendum)
 You were seen in the ER today for evaluation of your fatigue.  We fortunately did not find an emergency cause for this.  Continue to follow-up with your outpatient team for further evaluation.  Return to the ER for new or worsening symptoms.

## 2024-02-04 NOTE — ED Triage Notes (Signed)
 Per EMS patient to ED from home due to not feeling well and wheezing. Family called EMS when they found patient lying in bed and not acting himself.   1g Tylenol  Duoneb

## 2024-02-08 ENCOUNTER — Encounter

## 2024-02-10 ENCOUNTER — Encounter

## 2024-02-10 ENCOUNTER — Other Ambulatory Visit: Payer: Self-pay | Admitting: Family Medicine

## 2024-02-10 DIAGNOSIS — K219 Gastro-esophageal reflux disease without esophagitis: Secondary | ICD-10-CM

## 2024-02-11 ENCOUNTER — Encounter

## 2024-02-12 ENCOUNTER — Ambulatory Visit: Admitting: Family

## 2024-02-12 NOTE — Telephone Encounter (Signed)
 Requested Prescriptions  Pending Prescriptions Disp Refills   omeprazole  (PRILOSEC) 40 MG capsule [Pharmacy Med Name: OMEPRAZOLE  40MG  CAPSULES] 90 capsule 0    Sig: TAKE 1 CAPSULE(40 MG) BY MOUTH DAILY BEFORE BREAKFAST     Gastroenterology: Proton Pump Inhibitors Passed - 02/12/2024  8:11 AM      Passed - Valid encounter within last 12 months    Recent Outpatient Visits           1 month ago Breast pain, right   [place] [facility] [place], Marsa PARAS, DO   3 months ago Type 2 diabetes mellitus with diabetic cataract, without long-term current use of insulin  [facility])   [facility] [place], Marsa PARAS, DO   5 months ago Gross hematuria   [place] [facility], Angeline ORN, NP   8 months ago Right arm pain   [place] [facility] Edman Marsa PARAS, DO   9 months ago Annual physical exam   [facility] [place] Marsa PARAS, DO       Future Appointments             In 1 month Vaillancourt, Samantha, PA-C  [facility] [place]

## 2024-02-29 ENCOUNTER — Telehealth: Payer: Self-pay | Admitting: Cardiology

## 2024-02-29 MED ORDER — EZETIMIBE 10 MG PO TABS: 10.0000 mg | ORAL_TABLET | Freq: Every day | ORAL | 1 refills | Status: AC

## 2024-02-29 NOTE — Telephone Encounter (Signed)
 Medication refilled to pt preferred pharmacy.

## 2024-03-10 ENCOUNTER — Ambulatory Visit: Admitting: Family

## 2024-03-17 ENCOUNTER — Ambulatory Visit: Payer: Self-pay

## 2024-03-17 NOTE — Telephone Encounter (Signed)
 FYI Only or Action Required?: FYI only for provider: ED advised.  Patient was last seen in primary care on 12/25/2023 by Edman Marsa PARAS, DO.  Called Nurse Triage reporting Dizziness.  Symptoms began several days ago.  Interventions attempted: Other: assessment deferred.  Symptoms are: rapidly worsening.  Triage Disposition: Go to ED Now (Notify PCP)  Patient/caregiver understands and will follow disposition?: Yes    Copied from CRM [id number]. Topic: Clinical - Red Word Triage >> Mar 17, 2024  5:41 PM Hadassah PARAS wrote: Red Word that prompted transfer to Nurse Triage: Pt has been experiencing dizzyness for 3 days. Pt tried to stand up and fell back earlier today. Daughter on the line calling to set up appointment. Transferred to NT    Reason for Disposition  SEVERE dizziness (vertigo) (e.g., unable to walk without assistance)  Answer Assessment - Initial Assessment Questions DESCRIPTION: Describe your dizziness.     Severe, needs assistance walking, goes right back to bed after being in bathroom.   VERTIGO: Do you feel like either you or the room is spinning or tilting?      Head spinning.   LIGHTHEADED: Do you feel lightheaded? (e.g., somewhat faint, woozy, weak upon standing)     Denies light-headed.   SEVERITY: How bad is it?  Can you walk?     Severe  ONSET:  When did the dizziness begin?     3 Days ago  RECURRENT SYMPTOM: Have you had dizziness before? If Yes, ask: When was the last time? What happened that time?     Past history but never this severe per daughter.  Protocols used: Dizziness - Vertigo-A-AH

## 2024-03-18 ENCOUNTER — Telehealth: Payer: Self-pay | Admitting: Family Medicine

## 2024-03-18 ENCOUNTER — Other Ambulatory Visit: Payer: Self-pay | Admitting: Family Medicine

## 2024-03-18 ENCOUNTER — Other Ambulatory Visit: Payer: Self-pay | Admitting: Neurosurgery

## 2024-03-18 ENCOUNTER — Telehealth: Admitting: Family Medicine

## 2024-03-18 ENCOUNTER — Encounter: Payer: Self-pay | Admitting: Family Medicine

## 2024-03-18 DIAGNOSIS — M545 Low back pain, unspecified: Secondary | ICD-10-CM

## 2024-03-18 DIAGNOSIS — H8113 Benign paroxysmal vertigo, bilateral: Secondary | ICD-10-CM

## 2024-03-18 DIAGNOSIS — G8929 Other chronic pain: Secondary | ICD-10-CM

## 2024-03-18 DIAGNOSIS — D329 Benign neoplasm of meninges, unspecified: Secondary | ICD-10-CM

## 2024-03-18 DIAGNOSIS — N400 Enlarged prostate without lower urinary tract symptoms: Secondary | ICD-10-CM

## 2024-03-18 DIAGNOSIS — E1136 Type 2 diabetes mellitus with diabetic cataract: Secondary | ICD-10-CM

## 2024-03-18 DIAGNOSIS — I5022 Chronic systolic (congestive) heart failure: Secondary | ICD-10-CM

## 2024-03-18 DIAGNOSIS — D420 Neoplasm of uncertain behavior of cerebral meninges: Secondary | ICD-10-CM

## 2024-03-18 MED ORDER — MECLIZINE HCL 25 MG PO TABS: 25.0000 mg | ORAL_TABLET | Freq: Three times a day (TID) | ORAL | 2 refills | Status: AC | PRN

## 2024-03-18 MED ORDER — OXYCODONE HCL 5 MG PO TABS: 5.0000 mg | ORAL_TABLET | ORAL | 0 refills | Status: AC | PRN

## 2024-03-18 NOTE — Patient Instructions (Addendum)
 Referral submitted to Dr [facility] and other specialists.  1. You have symptoms of Vertigo (Benign Paroxysmal Positional Vertigo) - This is commonly caused by inner ear fluid imbalance, sometimes can be worsened by allergies and sinus symptoms, otherwise it can occur randomly sometimes and we may never discover the exact cause. - To treat this, try the Epley Manuever (see diagrams/instructions below) at home up to 3 times a day for 1-2 weeks or until symptoms resolve - You may take Meclizine  as needed up to 3 times a day for dizziness, this will not cure symptoms but may help. Caution may make you drowsy.  If you develop significant worsening episode with vertigo that does not improve and you get severe headache, loss of vision, arm or leg weakness, slurred speech, or other concerning symptoms please seek immediate medical attention at Emergency Department.  Please schedule a follow-up appointment with Dr Edman within 4 weeks if Vertigo not improving, and will consider Referral to Vestibular Rehab  See the next page for images describing the Epley Manuever.     ----------------------------------------------------------------------------------------------------------------------        Please schedule a Follow-up Appointment to: Return if symptoms worsen or fail to improve.  If you have any other questions or concerns, please feel free to call the office or send a message through MyChart. You may also schedule an earlier appointment if necessary.  Additionally, you may be receiving a survey about your experience at our office within a few days to 1 week by e-mail or mail. We value your feedback.  Marsa Edman, DO [facility], [place]

## 2024-03-18 NOTE — Progress Notes (Signed)
 "  Subjective:    Patient ID: Ross Riley, male    DOB: 17-Nov-1950, 74 y.o.   MRN: [id number]  DERREN Riley is a 74 y.o. male presenting on 03/18/2024 for Dizziness   Virtual / Telehealth Encounter - Video Visit via MyChart The purpose of this virtual visit is to provide medical care while limiting exposure to the novel coronavirus (COVID19) for both patient and office staff.  Consent was obtained for remote visit:  Yes.   Answered questions that patient had about telehealth interaction:  Yes.   I discussed the limitations, risks, security and privacy concerns of performing an evaluation and management service by video/telephone. I also discussed with the patient that there may be a patient responsible charge related to this service. The patient expressed understanding and agreed to proceed.  Patient Location: Home Provider Location: [facility] (Office)  Participants in virtual visit: - Patient: Ross Riley, daughter Ross Riley also present. - CMA: Twanna Anon CMA - Provider: Dr Edman   HPI  Discussed the use of AI scribe software for clinical note transcription with the patient, who gave verbal consent to proceed.  History of Present Illness   Ross Riley is a 74 year old male with a history of meningioma who presents with severe dizziness.  Vertigo and dizziness - Severe dizziness for the past three days - Unable to get out of bed due to dizziness - Dizziness worsens with attempts to stand, resulting in immediate return to bed and holding his head - Describes this episode as more severe than previous episodes of dizziness - Sensation of head spinning with the bed - Unable to perform home exercises such as the Epley maneuver due to severity of symptoms - Known history of prior Vertigo in past  Acute on Chronic Low Back pain - Severe back pain secondary to limited mobility from dizziness - Previously prescribed oxycodone  for back  pain in October, which provided relief - Currently not taking any other medications for back pain  History of meningioma - History of meningioma - Previously followed by neurosurgery at [facility], but discontinued care due to distance - Awaiting a call to schedule an MRI, which has not yet been completed         11/12/2023    3:17 PM 08/13/2023    2:55 PM 12/26/2022    2:03 PM  Depression screen PHQ 2/9  Decreased Interest 1 0 0  Down, Depressed, Hopeless 1 0 0  PHQ - 2 Score 2 0 0  Altered sleeping 0  0  Tired, decreased energy 2  0  Change in appetite 0  0  Feeling bad or failure about yourself  0  0  Trouble concentrating 0  0  Moving slowly or fidgety/restless 1  0  Suicidal thoughts 1  0  PHQ-9 Score 6   0   Difficult doing work/chores Not difficult at all  Not difficult at all     Data saved with a previous flowsheet row definition       12/26/2022    2:03 PM 10/24/2022    2:26 PM 06/23/2022    4:34 PM 04/25/2022   10:24 AM  GAD 7 : Generalized Anxiety Score  Nervous, Anxious, on Edge 0 0 0 1  Control/stop worrying 0 0 0 1  Worry too much - different things 0 0 0 0  Trouble relaxing 0 1 0 1  Restless 0 1 0 0  Easily annoyed or irritable 0  0 0 0  Afraid - awful might happen 0 0 0 0  Total GAD 7 Score 0 2 0 3  Anxiety Difficulty Not difficult at all Not difficult at all  Not difficult at all    Social History[1]  Review of Systems Per HPI unless specifically indicated above     Objective:    There were no vitals taken for this visit.  Wt Readings from Last 3 Encounters:  02/04/24 165 lb (74.8 kg)  01/13/24 163 lb 6.4 oz (74.1 kg)  12/25/23 165 lb 4 oz (75 kg)     Physical Exam  Note examination was completely remotely via video observation objective data only  Unable to successfully get video portion of call to connect, audio connected but video unsuccessful   Results for orders placed or performed during the hospital encounter of 02/04/24   Comprehensive metabolic panel   Collection Time: 02/04/24 12:13 PM  Result Value Ref Range   Sodium 140 135 - 145 mmol/L   Potassium 3.9 3.5 - 5.1 mmol/L   Chloride 110 98 - 111 mmol/L   CO2 19 (L) 22 - 32 mmol/L   Glucose, Bld 95 70 - 99 mg/dL   BUN 11 8 - 23 mg/dL   Creatinine, Ser 9.31 0.61 - 1.24 mg/dL   Calcium  7.8 (L) 8.9 - 10.3 mg/dL   Total Protein 6.5 6.5 - 8.1 g/dL   Albumin 3.7 3.5 - 5.0 g/dL   AST 28 15 - 41 U/L   ALT 20 0 - 44 U/L   Alkaline Phosphatase 56 38 - 126 U/L   Total Bilirubin 1.0 0.0 - 1.2 mg/dL   GFR, Estimated >39 >39 mL/min   Anion gap 12 5 - 15  CBC   Collection Time: 02/04/24 12:13 PM  Result Value Ref Range   WBC 5.6 4.0 - 10.5 K/uL   RBC 5.07 4.22 - 5.81 MIL/uL   Hemoglobin 15.3 13.0 - 17.0 g/dL   HCT 52.0 60.9 - 47.9 %   MCV 94.5 80.0 - 100.0 fL   MCH 30.2 26.0 - 34.0 pg   MCHC 31.9 30.0 - 36.0 g/dL   RDW 87.0 88.4 - 84.4 %   Platelets 191 150 - 400 K/uL   nRBC 0.0 0.0 - 0.2 %  Urinalysis, Routine w reflex microscopic -Urine, Clean Catch   Collection Time: 02/04/24 12:13 PM  Result Value Ref Range   Color, Urine YELLOW (A) YELLOW   APPearance CLEAR (A) CLEAR   Specific Gravity, Urine 1.032 (H) 1.005 - 1.030   pH 7.0 5.0 - 8.0   Glucose, UA >=500 (A) NEGATIVE mg/dL   Hgb urine dipstick NEGATIVE NEGATIVE   Bilirubin Urine NEGATIVE NEGATIVE   Ketones, ur NEGATIVE NEGATIVE mg/dL   Protein, ur NEGATIVE NEGATIVE mg/dL   Nitrite NEGATIVE NEGATIVE   Leukocytes,Ua NEGATIVE NEGATIVE   RBC / HPF 0-5 0 - 5 RBC/hpf   WBC, UA 0-5 0 - 5 WBC/hpf   Bacteria, UA NONE SEEN NONE SEEN   Squamous Epithelial / HPF 0-5 0 - 5 /HPF   Mucus PRESENT   Resp panel by RT-PCR (RSV, Flu A&B, Covid) Anterior Nasal Swab   Collection Time: 02/04/24  2:47 PM   Specimen: Anterior Nasal Swab  Result Value Ref Range   SARS Coronavirus 2 by RT PCR NEGATIVE NEGATIVE   Influenza A by PCR NEGATIVE NEGATIVE   Influenza B by PCR NEGATIVE NEGATIVE   Resp Syncytial Virus  by PCR NEGATIVE NEGATIVE  Assessment & Plan:   Problem List Items Addressed This Visit     BPPV (benign paroxysmal positional vertigo), bilateral - Primary   Relevant Medications   meclizine  (ANTIVERT ) 25 MG tablet   Other Visit Diagnoses       Chronic left-sided low back pain without sciatica       Relevant Medications   oxyCODONE  (OXY IR/ROXICODONE ) 5 MG immediate release tablet        Benign paroxysmal positional vertigo, bilateral Severe dizziness for three days, worsened by positional changes, preventing ambulation.  No focal neurological symptoms Awaiting Neurosurgery MRI for meningioma evaluation.  - Prescribed meclizine ; quantity: 90 tablets. - Provided MyChart summary sheet with home exercises (Epley maneuver) to be attempted once dizziness subsides.  Chronic low back pain Exacerbated by recent immobility due to dizziness triggering acute on chronic back pain Previously successful on Oxycodone  AS NEEDED in past for short term significant pain Re order Oxycodone , temporary supply PRN      No orders of the defined types were placed in this encounter.   Meds ordered this encounter  Medications   meclizine  (ANTIVERT ) 25 MG tablet    Sig: Take 1 tablet (25 mg total) by mouth 3 (three) times daily as needed.    Dispense:  90 tablet    Refill:  2   oxyCODONE  (OXY IR/ROXICODONE ) 5 MG immediate release tablet    Sig: Take 1 tablet (5 mg total) by mouth every 4 (four) hours as needed for severe pain (pain score 7-10).    Dispense:  20 tablet    Refill:  0    Follow up plan: Return if symptoms worsen or fail to improve.   Patient verbalizes understanding with the above medical recommendations including the limitation of remote medical advice.  Specific follow-up and call-back criteria were given for patient to follow-up or seek medical care more urgently if needed.  Total duration of direct patient care provided via video conference: 15  minutes   Marsa Officer, DO [facility] 03/18/2024, 3:59 PM     [1]  Social History Tobacco Use   Smoking status: Former    Current packs/day: 0.00    Average packs/day: 1 pack/day for 32.0 years (32.0 ttl pk-yrs)    Types: Cigarettes    Start date: 03/04/1959    Quit date: 03/04/1991    Years since quitting: 33.0   Smokeless tobacco: Former  Building Services Engineer status: Never Used  Substance Use Topics   Alcohol use: Yes    Alcohol/week: 7.0 standard drinks of alcohol    Types: 7 Shots of liquor per week    Comment: 1-2 shots of vodka daily   Drug use: No   "

## 2024-03-18 NOTE — Telephone Encounter (Signed)
 Copied from CRM [id number]. Topic: Clinical - Refused Triage >> Mar 18, 2024 11:16 AM Selinda RAMAN wrote: Patient/caller voiced complaints of Occasional Dizziness. Declined transfer to triage.  [name] the daughter of the patient called in stating her father has bouts of occasional dizziness and she originally wanted to know if a medication from 2024- Meclizine  could be refilled. She declined triage and just wanted to make the soonest available virtual appointment. I scheduled it for this afternoon with his provider and she was appreciative.

## 2024-03-18 NOTE — Telephone Encounter (Signed)
 Patient was seen for virtual MyChart video visit today  Ross Officer, DO [facility] 03/18/2024, 5:33 PM

## 2024-03-21 ENCOUNTER — Ambulatory Visit (INDEPENDENT_AMBULATORY_CARE_PROVIDER_SITE_OTHER): Admitting: Physician Assistant

## 2024-03-21 ENCOUNTER — Encounter: Payer: Self-pay | Admitting: Physician Assistant

## 2024-03-21 VITALS — BP 115/76 | HR 61

## 2024-03-21 DIAGNOSIS — N401 Enlarged prostate with lower urinary tract symptoms: Secondary | ICD-10-CM | POA: Diagnosis not present

## 2024-03-21 DIAGNOSIS — R350 Frequency of micturition: Secondary | ICD-10-CM | POA: Diagnosis not present

## 2024-03-21 LAB — URINALYSIS, COMPLETE
Bilirubin, UA: NEGATIVE
Ketones, UA: NEGATIVE
Leukocytes,UA: NEGATIVE
Nitrite, UA: NEGATIVE
Protein,UA: NEGATIVE
Specific Gravity, UA: 1.015 (ref 1.005–1.030)
Urobilinogen, Ur: 0.2 mg/dL (ref 0.2–1.0)
pH, UA: 5.5 (ref 5.0–7.5)

## 2024-03-21 LAB — MICROSCOPIC EXAMINATION: Bacteria, UA: NONE SEEN

## 2024-03-21 LAB — BLADDER SCAN AMB NON-IMAGING

## 2024-03-21 MED ORDER — FINASTERIDE 5 MG PO TABS: 5.0000 mg | ORAL_TABLET | Freq: Every day | ORAL | 3 refills | Status: AC

## 2024-03-21 MED ORDER — OXYBUTYNIN CHLORIDE ER 10 MG PO TB24: 10.0000 mg | ORAL_TABLET | Freq: Every day | ORAL | 3 refills | Status: AC

## 2024-03-21 NOTE — Progress Notes (Signed)
 "  03/21/2024 1:52 PM   Ross Riley [birth year] [id number]  CC: Chief Complaint  Patient presents with   Benign Prostatic Hypertrophy   HPI: Ross Riley is a 74 y.o. male with PMH BPH with LUTS s/p TURP with Dr. Chauncey in 2016 and gross hematuria of presumed prostatic origin on finasteride  since January 2025 who presents today for follow-up.   Today he reports bothersome daytime frequency x 7-8.  He denies nocturia.  Otherwise his voiding symptoms are stable.  He has been on Jardiance  for about 3 months for management of DM2 and HFrEF and has not noticed a change in his voiding symptoms with this.  He desires a medication today that can resolve his enlarged prostate problem.  IPSS 13/unhappy as below.  PVR 9 mL.  UA today notable for 3+ glucose and 1+ blood; urine microscopy pan negative.   IPSS     Row Name 03/21/24 1300         International Prostate Symptom Score   How often have you had the sensation of not emptying your bladder? Not at All     How often have you had to urinate less than every two hours? More than half the time     How often have you found you stopped and started again several times when you urinated? Less than half the time     How often have you found it difficult to postpone urination? About half the time     How often have you had a weak urinary stream? Less than 1 in 5 times     How often have you had to strain to start urination? Less than 1 in 5 times     How many times did you typically get up at night to urinate? 2 Times     Total IPSS Score 13       Quality of Life due to urinary symptoms   If you were to spend the rest of your life with your urinary condition just the way it is now how would you feel about that? Unhappy         PMH: Past Medical History:  Diagnosis Date   Arthritis    BPH (benign prostatic hyperplasia)    Chronic kidney disease    kidney stones   Dystonia    ED (erectile dysfunction)    GERD (gastroesophageal  reflux disease)    Glaucoma (increased eye pressure)    Hematuria    Hemorrhoids    Hyperlipidemia    Hypertension    Stroke (HCC)    Thyroid  disease     Surgical History: Past Surgical History:  Procedure Laterality Date   CATARACT EXTRACTION Bilateral    CORONARY/GRAFT ACUTE MI REVASCULARIZATION N/A 12/20/2022   Procedure: Coronary/Graft Acute MI Revascularization;  Surgeon: Darron Deatrice LABOR, MD;  Location: [facility] INVASIVE CV LAB;  Service: Cardiovascular;  Laterality: N/A;   EXTRACORPOREAL SHOCK WAVE LITHOTRIPSY Right    EYE SURGERY     ICD IMPLANT N/A 07/06/2023   Procedure: ICD IMPLANT;  Surgeon: Kennyth Chew, MD;  Location: [facility] INVASIVE CV LAB;  Service: Cardiovascular;  Laterality: N/A;   JOINT REPLACEMENT Left 08/2016   left knee, [place]   KIDNEY STONE SURGERY  2009   LEFT HEART CATH AND CORONARY ANGIOGRAPHY N/A 12/20/2022   Procedure: LEFT HEART CATH AND CORONARY ANGIOGRAPHY;  Surgeon: Darron Deatrice LABOR, MD;  Location: [facility] INVASIVE CV LAB;  Service: Cardiovascular;  Laterality: N/A;   PROSTATE SURGERY  2011   TRANSURETHRAL RESECTION OF PROSTATE N/A 12/06/2014   Procedure: TRANSURETHRAL RESECTION OF THE PROSTATE (TURP);  Surgeon: Redell Lynwood Napoleon, MD;  Location: [facility] ORS;  Service: Urology;  Laterality: N/A;    Home Medications:  Allergies as of 03/21/2024       Reactions   Metformin And Related Other (See Comments), Hives, Rash   Headache and dizzines Headache and dizzines    headache and dizziness   Metformin Hcl Other (See Comments)   headache and dizziness        Medication List        Accurate as of March 21, 2024  1:52 PM. If you have any questions, ask your nurse or doctor.          Accu-Chek Aviva Plus test strip Generic drug: glucose blood Use to check blood sugar up to 1 x per day   Accu-Chek Aviva Plus w/Device Kit Use to check blood sugar up to 1 x per day   Accu-Chek Softclix Lancets lancets Use to check blood sugar up to 1 x per  day   atorvastatin  80 MG tablet Commonly known as: LIPITOR  TAKE 1 TABLET(80 MG) BY MOUTH AT BEDTIME   carvedilol  6.25 MG tablet Commonly known as: COREG  TAKE 1 TABLET(6.25 MG) BY MOUTH TWICE DAILY WITH A MEAL   dorzolamide -timolol  2-0.5 % ophthalmic solution Commonly known as: COSOPT  Place 1 drop into both eyes 2 (two) times daily.   eplerenone  25 MG tablet Commonly known as: INSPRA  Take 1 tablet (25 mg total) by mouth daily.   ezetimibe  10 MG tablet Commonly known as: Zetia  Take 1 tablet (10 mg total) by mouth daily.   finasteride  5 MG tablet Commonly known as: PROSCAR  Take 1 tablet (5 mg total) by mouth daily.   folic acid  1 MG tablet Commonly known as: FOLVITE  Tome 1 tableta (1 mg en total) por va oral diariamente. (Take 1 tablet (1 mg total) by mouth daily.)   gabapentin  600 MG tablet Commonly known as: Neurontin  Take 1 tablet (600 mg total) by mouth 3 (three) times daily.   isosorbide  mononitrate 30 MG 24 hr tablet Commonly known as: IMDUR  TAKE 1/2 TABLET(15 MG) BY MOUTH DAILY   Jardiance  25 MG Tabs tablet Generic drug: empagliflozin  Take 25 mg by mouth daily.   latanoprost  0.005 % ophthalmic solution Commonly known as: XALATAN  Place 1 drop into both eyes at bedtime.   levothyroxine  75 MCG tablet Commonly known as: SYNTHROID  Take 75 mcg by mouth daily before breakfast.   meclizine  25 MG tablet Commonly known as: ANTIVERT  Take 1 tablet (25 mg total) by mouth 3 (three) times daily as needed.   multivitamin with minerals Tabs tablet Tome 1 tableta por va oral diariamente. (Take 1 tablet by mouth daily.)   nitroGLYCERIN  0.4 MG SL tablet Commonly known as: NITROSTAT  Place 1 tablet (0.4 mg total) under the tongue every 5 (five) minutes as needed for chest pain.   omeprazole  40 MG capsule Commonly known as: PRILOSEC TAKE 1 CAPSULE(40 MG) BY MOUTH DAILY BEFORE BREAKFAST   oxyCODONE  5 MG immediate release tablet Commonly known as: Oxy  IR/ROXICODONE  Take 1 tablet (5 mg total) by mouth every 4 (four) hours as needed for severe pain (pain score 7-10).   sacubitril -valsartan  24-26 MG Commonly known as: ENTRESTO  Take 1 tablet by mouth 2 (two) times daily.   ticagrelor  90 MG Tabs tablet Commonly known as: Brilinta  Take 1 tablet (90 mg total) by mouth 2 (two) times daily.  Allergies:  Allergies[1]  Family History: Family History  Problem Relation Age of Onset   Heart disease Mother    Arthritis Mother    Hypertension Brother    Diabetes Daughter     Social History:   reports that he quit smoking about 33 years ago. His smoking use included cigarettes. He started smoking about 65 years ago. He has a 32 pack-year smoking history. He has quit using smokeless tobacco. He reports current alcohol use of about 7.0 standard drinks of alcohol per week. He reports that he does not use drugs.  Physical Exam: BP 115/76   Pulse 61   SpO2 98%   Constitutional:  Alert and oriented, no acute distress, nontoxic appearing HEENT: , AT Cardiovascular: No clubbing, cyanosis, or edema Respiratory: Normal respiratory effort, no increased work of breathing Skin: No rashes, bruises or suspicious lesions Neurologic: Grossly intact, no focal deficits, moving all 4 extremities Psychiatric: Normal mood and affect  Laboratory Data: Results for orders placed or performed in visit on 03/21/24  Bladder Scan (Post Void Residual) in office   Collection Time: 03/21/24  1:44 PM  Result Value Ref Range   Scan Result 9ml    Assessment & Plan:   1. Benign prostatic hyperplasia with urinary frequency (Primary) Emptying appropriately, UA bland.  Voiding symptoms overall stable, though with new urinary frequency possibly associated with Jardiance .  I offered to augment finasteride  with oxybutynin .  He was rather frustrated with me today that I did not have a pill to fix his BPH and he is rather insistent that he already had prostate  surgery.  We need to have realistic goals regarding the durability of his prior surgery.  I offered him a HOLEP consultation, but he prefers to defer this in light of recent cardiac procedures.  Will stick with dual pharmacotherapy for now. - Urinalysis, Complete - Bladder Scan (Post Void Residual) in office - finasteride  (PROSCAR ) 5 MG tablet; Take 1 tablet (5 mg total) by mouth daily.  Dispense: 90 tablet; Refill: 3 - oxybutynin  (DITROPAN -XL) 10 MG 24 hr tablet; Take 1 tablet (10 mg total) by mouth daily.  Dispense: 90 tablet; Refill: 3   Return in about 6 weeks (around 05/02/2024) for Symptom recheck with PVR.  Lucie Hones, PA-C  [facility] [place] [address] [place], [place] [address] [phone]      [1]  Allergies Allergen Reactions   Metformin And Related Other (See Comments), Hives and Rash    Headache and dizzines  Headache and dizzines    headache and dizziness   Metformin Hcl Other (See Comments)    headache and dizziness   "

## 2024-03-28 NOTE — Progress Notes (Unsigned)
 "  Advanced [facility] Failure Clinic Note    PCP: Edman Marsa PARAS, DO Cardiology: Dr. Perla HF Cardiology: Dr. Rolan  Chief complaint: shortness of breath  HPI:   Mr Ross Riley is a 74 y.o. male with a history of CAD, ischemic cardiomyopathy, diabetes, and HTN was referred by Dr. Gollan for evaluation of CHF. Patient was admitted in 10/24 with anterior MI. Cath showed occluded proximal LAD treated with DES, D1 and D2 jailed by stent, residual 60% distal LAD.  Echo in 10/24 showed EF 35-40%, normal RV.  Repeat echo in 3/25 showed EF 30-35%, anteroseptal akinesis, RV normal, mild MR.    [employer] ICD placed in 5/25.   Patient returns a HF follow-up visit with a chief complaint of minimal shortness of breath. Has associated fatigue, occasional dizziness, right breast tenderness. Sleeping well on 2 pillows. Eating well. He recently had a mammogram done due to right breast tenderness / enlargement.   Bos Sci ICD interrogated: HL score is 0, 69% AP  Labs (2/25): LDL 69, K 4.5, creatinine 9.12, hgb 15.6  Labs (4/25): K 4.7 => 5.1, creatinine 0.97 => 1.08, BNP 64, LDL 45 Labs (7/25): A1c 8.1% Labs (8/25): K 4.7 => 5.4, creatinine 1.09 => 1.36, BNP 28 Labs (10/25): A1c 7.4%  PMH: 1. Type 2 diabetes 2. HTN 3. Hyperlipidemia 4. CAD: Anterior MI in 10/24, cath with occluded proximal LAD treated with DES, D1 and D2 jailed by stent, residual 60% distal LAD.  5. Chronic systolic CHF: Ischemic cardiomyopathy. [employer] ICD.  - Echo (10/24): EF 35-40%, normal RV - Echo (3/25): EF 30-35%, anteroseptal akinesis, RV normal, mild MR.  6. Zio monitor (5/25): 1 run of 6 beats SVT.   SH: Retired, prior smoker but has quit. Prior heavy ETOH. Lives in [place] with wife. Spanish-speaking.   Family History  Problem Relation Age of Onset   Heart disease Mother    Arthritis Mother    Hypertension Brother    Diabetes Daughter    ROS: All systems reviewed and negative except as per  HPI.   Current Outpatient Medications  Medication Sig Dispense Refill   ACCU-CHEK AVIVA PLUS test strip Use to check blood sugar up to 1 x per day 100 each 12   Accu-Chek Softclix Lancets lancets Use to check blood sugar up to 1 x per day 100 each 12   atorvastatin  (LIPITOR ) 80 MG tablet TAKE 1 TABLET(80 MG) BY MOUTH AT BEDTIME 90 tablet 1   Blood Glucose Monitoring Suppl (ACCU-CHEK AVIVA PLUS) w/Device KIT Use to check blood sugar up to 1 x per day 1 kit 0   carvedilol  (COREG ) 6.25 MG tablet TAKE 1 TABLET(6.25 MG) BY MOUTH TWICE DAILY WITH A MEAL 60 tablet 3   dorzolamide -timolol  (COSOPT ) 22.3-6.8 MG/ML ophthalmic solution Place 1 drop into both eyes 2 (two) times daily.     eplerenone  (INSPRA ) 25 MG tablet Take 1 tablet (25 mg total) by mouth daily. 90 tablet 3   ezetimibe  (ZETIA ) 10 MG tablet Take 1 tablet (10 mg total) by mouth daily. 90 tablet 1   finasteride  (PROSCAR ) 5 MG tablet Take 1 tablet (5 mg total) by mouth daily. 90 tablet 3   folic acid  (FOLVITE ) 1 MG tablet Take 1 tablet (1 mg total) by mouth daily. 30 tablet 2   gabapentin  (NEURONTIN ) 600 MG tablet Take 1 tablet (600 mg total) by mouth 3 (three) times daily. 270 tablet 3   isosorbide  mononitrate (IMDUR ) 30 MG 24 hr tablet TAKE  1/2 TABLET(15 MG) BY MOUTH DAILY 45 tablet 3   JARDIANCE  25 MG TABS tablet Take 25 mg by mouth daily.     latanoprost  (XALATAN ) 0.005 % ophthalmic solution Place 1 drop into both eyes at bedtime.  6   levothyroxine  (SYNTHROID ) 75 MCG tablet Take 75 mcg by mouth daily before breakfast.     meclizine  (ANTIVERT ) 25 MG tablet Take 1 tablet (25 mg total) by mouth 3 (three) times daily as needed. 90 tablet 2   Multiple Vitamin (MULTIVITAMIN WITH MINERALS) TABS tablet Take 1 tablet by mouth daily. 30 tablet 2   nitroGLYCERIN  (NITROSTAT ) 0.4 MG SL tablet Place 1 tablet (0.4 mg total) under the tongue every 5 (five) minutes as needed for chest pain. 25 tablet 2   omeprazole  (PRILOSEC) 40 MG capsule TAKE 1  CAPSULE(40 MG) BY MOUTH DAILY BEFORE BREAKFAST 90 capsule 0   oxybutynin  (DITROPAN -XL) 10 MG 24 hr tablet Take 1 tablet (10 mg total) by mouth daily. 90 tablet 3   oxyCODONE  (OXY IR/ROXICODONE ) 5 MG immediate release tablet Take 1 tablet (5 mg total) by mouth every 4 (four) hours as needed for severe pain (pain score 7-10). 20 tablet 0   sacubitril -valsartan  (ENTRESTO ) 24-26 MG Take 1 tablet by mouth 2 (two) times daily. 180 tablet 3   ticagrelor  (BRILINTA ) 90 MG TABS tablet Take 1 tablet (90 mg total) by mouth 2 (two) times daily. 180 tablet 1   No current facility-administered medications for this visit.   There were no vitals filed for this visit.  Wt Readings from Last 3 Encounters:  02/04/24 165 lb (74.8 kg)  01/13/24 163 lb 6.4 oz (74.1 kg)  12/25/23 165 lb 4 oz (75 kg)   Lab Results  Component Value Date   CREATININE 0.68 02/04/2024   CREATININE 0.95 01/13/2024   CREATININE 1.09 10/30/2023    Physical Exam:  General: Well appearing.  Cor: No JVD. Regular rhythm, rate. Tenderness over right breast.  Lungs: clear Abdomen: soft, nontender, nondistended. Extremities: no edema Neuro:. Affect pleasant   Assessment/Plan: 1. Chronic systolic CHF: Ischemic cardiomyopathy. [employer] ICD.  Most recent echo in 3/25 showed EF 30-35%, anteroseptal akinesis, RV normal, mild MR.  NYHA class II. Euvolemic today - Continue Coreg  6.25 mg bid. - stop spironolactone  due to gynecomastia and begin eplerenone  25mg  daily. If symptoms continue, may need to stop this as well - Continue Jardiance  25 mg daily (DM dose).  - Continue Entresto  24/26mg  bid. BP will not allow for titration - BMET today - Completed cardiac rehab - HL score today is 0 2. CAD: Anterior MI in 10/24, cath with occluded proximal LAD treated with DES, D1 and D2 jailed by stent, residual 60% distal LAD. Rare atypical chest pain.  - Continue ticagrelor  90 bid.  - Continue atorvastatin  80 mg daily. Good lipids in 4/25.   3. Hyperlipidemia: Goal LDL < 55.  - Continue atorvastatin  80 mg daily.  - Continue Zetia  10mg  daily.    Return in 1 month, sooner if needed.   I spent 30 minutes reviewing records, interviewing/ examing patient and managing plan/ orders.    Ellouise DELENA Class 03/28/2024 "

## 2024-03-29 ENCOUNTER — Encounter: Payer: Self-pay | Admitting: Family

## 2024-03-29 ENCOUNTER — Telehealth: Payer: Self-pay | Admitting: Family Medicine

## 2024-03-29 ENCOUNTER — Ambulatory Visit: Attending: Family | Admitting: Family

## 2024-03-29 VITALS — Wt 164.6 lb

## 2024-03-29 DIAGNOSIS — I2510 Atherosclerotic heart disease of native coronary artery without angina pectoris: Secondary | ICD-10-CM | POA: Insufficient documentation

## 2024-03-29 DIAGNOSIS — E785 Hyperlipidemia, unspecified: Secondary | ICD-10-CM | POA: Diagnosis not present

## 2024-03-29 DIAGNOSIS — Z95810 Presence of automatic (implantable) cardiac defibrillator: Secondary | ICD-10-CM | POA: Insufficient documentation

## 2024-03-29 DIAGNOSIS — I110 Hypertensive heart disease with heart failure: Secondary | ICD-10-CM | POA: Diagnosis not present

## 2024-03-29 DIAGNOSIS — Z79899 Other long term (current) drug therapy: Secondary | ICD-10-CM | POA: Insufficient documentation

## 2024-03-29 DIAGNOSIS — I5022 Chronic systolic (congestive) heart failure: Secondary | ICD-10-CM | POA: Diagnosis not present

## 2024-03-29 DIAGNOSIS — I252 Old myocardial infarction: Secondary | ICD-10-CM | POA: Insufficient documentation

## 2024-03-29 DIAGNOSIS — Z7902 Long term (current) use of antithrombotics/antiplatelets: Secondary | ICD-10-CM | POA: Insufficient documentation

## 2024-03-29 DIAGNOSIS — Z87891 Personal history of nicotine dependence: Secondary | ICD-10-CM | POA: Insufficient documentation

## 2024-03-29 DIAGNOSIS — I255 Ischemic cardiomyopathy: Secondary | ICD-10-CM | POA: Insufficient documentation

## 2024-03-29 DIAGNOSIS — E119 Type 2 diabetes mellitus without complications: Secondary | ICD-10-CM | POA: Diagnosis not present

## 2024-03-29 DIAGNOSIS — Z955 Presence of coronary angioplasty implant and graft: Secondary | ICD-10-CM | POA: Diagnosis not present

## 2024-03-29 DIAGNOSIS — R42 Dizziness and giddiness: Secondary | ICD-10-CM | POA: Diagnosis not present

## 2024-03-29 DIAGNOSIS — D329 Benign neoplasm of meninges, unspecified: Secondary | ICD-10-CM | POA: Insufficient documentation

## 2024-03-29 MED ORDER — TICAGRELOR 90 MG PO TABS: 90.0000 mg | ORAL_TABLET | Freq: Two times a day (BID) | ORAL | 0 refills | Status: DC

## 2024-03-29 MED ORDER — TICAGRELOR 90 MG PO TABS: 90.0000 mg | ORAL_TABLET | Freq: Two times a day (BID) | ORAL | 0 refills | Status: AC

## 2024-03-29 NOTE — Telephone Encounter (Signed)
 Please call patient to schedule follow-up as recommended by his Cardiologist.  I saw him on 03/18/24 however it sounds like the dizziness is not improving.  His Cardiologist stopped a BP medication. Can you find out if he is improving at all still or is it the same?  I can see him back sooner than his visit in February 04/28/24 if he is not improving. Or if he is doing better I can see him on 04/28/24.  Marsa Officer, DO [facility] 03/29/2024, 4:53 PM

## 2024-03-29 NOTE — Patient Instructions (Signed)
 Medication Changes:  STOP Entresto     Follow-Up in: Please follow up with the Advanced Heart Failure Clinic in 2 months with Ellouise Class, FNP.   Thank you for choosing [facility].    At the Advanced Heart Failure Clinic, you and your health needs are our priority. We have a designated team specialized in the treatment of Heart Failure. This Care Team includes your primary Heart Failure Specialized Cardiologist (physician), Advanced Practice Providers (APPs- Physician Assistants and Nurse Practitioners), and Pharmacist who all work together to provide you with the care you need, when you need it.   You may see any of the following providers on your designated Care Team at your next follow up:  Dr. Toribio Fuel Dr. Ezra Shuck Dr. Ria Commander Dr. Morene Brownie Ellouise Class, FNP Jaun Bash, RPH-CPP  Please be sure to bring in all your medications bottles to every appointment.   Need to Contact Us :  If you have any questions or concerns before your next appointment please send us  a message through [place] or call our office at [phone].    TO LEAVE A MESSAGE FOR THE NURSE SELECT OPTION 2, PLEASE LEAVE A MESSAGE INCLUDING: YOUR NAME DATE OF BIRTH CALL BACK NUMBER REASON FOR CALL**this is important as we prioritize the call backs  YOU WILL RECEIVE A CALL BACK THE SAME DAY AS LONG AS YOU CALL BEFORE 4:00 PM

## 2024-03-30 ENCOUNTER — Telehealth: Payer: Self-pay

## 2024-03-30 NOTE — Telephone Encounter (Signed)
 Thank you for calling. This is good news. I appreciate the update. He can keep apt in February, or schedule sooner if needed.  Marsa Officer, DO [facility] 03/30/2024, 9:01 AM

## 2024-03-30 NOTE — Telephone Encounter (Signed)
 Reached out to pt via interpreter to see how he is feeling today and to see what his blood pressure is. Lvm to return call.

## 2024-03-30 NOTE — Telephone Encounter (Signed)
 I spoke with patient, he is not as dizzy as he was stated that has improved. He has stopped his blood pressure.

## 2024-04-13 ENCOUNTER — Encounter

## 2024-04-27 ENCOUNTER — Encounter: Admitting: Family Medicine

## 2024-04-28 ENCOUNTER — Encounter: Admitting: Family Medicine

## 2024-05-02 ENCOUNTER — Ambulatory Visit: Admitting: Physician Assistant

## 2024-05-03 ENCOUNTER — Other Ambulatory Visit (HOSPITAL_COMMUNITY)

## 2024-05-24 ENCOUNTER — Ambulatory Visit: Admitting: Family

## 2024-06-13 ENCOUNTER — Ambulatory Visit: Admitting: Cardiovascular Disease

## 2024-07-13 ENCOUNTER — Encounter

## 2024-08-19 ENCOUNTER — Ambulatory Visit

## 2024-08-24 ENCOUNTER — Ambulatory Visit

## 2024-10-12 ENCOUNTER — Encounter

## 2025-01-11 ENCOUNTER — Encounter
